# Patient Record
Sex: Female | Born: 1945
Health system: Southern US, Community
[De-identification: ages and names within clinical notes are randomized; demographics above are authoritative.]

## PROBLEM LIST (undated history)

## (undated) DIAGNOSIS — R112 Nausea with vomiting, unspecified: Secondary | ICD-10-CM

## (undated) DIAGNOSIS — I639 Cerebral infarction, unspecified: Secondary | ICD-10-CM

## (undated) DIAGNOSIS — R7301 Impaired fasting glucose: Secondary | ICD-10-CM

## (undated) DIAGNOSIS — E7800 Pure hypercholesterolemia, unspecified: Secondary | ICD-10-CM

## (undated) DIAGNOSIS — K219 Gastro-esophageal reflux disease without esophagitis: Secondary | ICD-10-CM

## (undated) DIAGNOSIS — M8580 Other specified disorders of bone density and structure, unspecified site: Secondary | ICD-10-CM

## (undated) DIAGNOSIS — F32A Depression, unspecified: Secondary | ICD-10-CM

## (undated) DIAGNOSIS — C801 Malignant (primary) neoplasm, unspecified: Secondary | ICD-10-CM

## (undated) DIAGNOSIS — Z9889 Other specified postprocedural states: Secondary | ICD-10-CM

## (undated) DIAGNOSIS — F329 Major depressive disorder, single episode, unspecified: Secondary | ICD-10-CM

## (undated) HISTORY — DX: Other specified disorders of bone density and structure, unspecified site: M85.80

## (undated) HISTORY — DX: Impaired fasting glucose: R73.01

## (undated) HISTORY — PX: OVARIAN CYST REMOVAL: SHX89

## (undated) HISTORY — PX: ECTOPIC PREGNANCY SURGERY: SHX613

## (undated) HISTORY — PX: ABDOMINAL HYSTERECTOMY: SHX81

---

## 2001-11-09 ENCOUNTER — Ambulatory Visit (HOSPITAL_COMMUNITY): Admission: RE | Admit: 2001-11-09 | Discharge: 2001-11-09 | Payer: Self-pay | Admitting: Family Medicine

## 2001-11-09 ENCOUNTER — Encounter: Payer: Self-pay | Admitting: Family Medicine

## 2001-12-01 ENCOUNTER — Encounter: Payer: Self-pay | Admitting: Family Medicine

## 2001-12-01 ENCOUNTER — Ambulatory Visit (HOSPITAL_COMMUNITY): Admission: RE | Admit: 2001-12-01 | Discharge: 2001-12-01 | Payer: Self-pay | Admitting: Family Medicine

## 2002-08-10 ENCOUNTER — Ambulatory Visit (HOSPITAL_COMMUNITY): Admission: RE | Admit: 2002-08-10 | Discharge: 2002-08-10 | Payer: Self-pay | Admitting: Family Medicine

## 2002-08-10 ENCOUNTER — Encounter: Payer: Self-pay | Admitting: Family Medicine

## 2002-12-24 ENCOUNTER — Encounter: Payer: Self-pay | Admitting: Family Medicine

## 2002-12-24 ENCOUNTER — Ambulatory Visit (HOSPITAL_COMMUNITY): Admission: RE | Admit: 2002-12-24 | Discharge: 2002-12-24 | Payer: Self-pay | Admitting: Family Medicine

## 2003-03-01 ENCOUNTER — Ambulatory Visit (HOSPITAL_COMMUNITY): Admission: RE | Admit: 2003-03-01 | Discharge: 2003-03-01 | Payer: Self-pay | Admitting: Family Medicine

## 2003-03-01 ENCOUNTER — Encounter: Payer: Self-pay | Admitting: Family Medicine

## 2005-03-26 ENCOUNTER — Ambulatory Visit (HOSPITAL_COMMUNITY): Admission: RE | Admit: 2005-03-26 | Discharge: 2005-03-26 | Payer: Self-pay | Admitting: Family Medicine

## 2005-12-09 HISTORY — PX: COLONOSCOPY: SHX174

## 2006-07-22 ENCOUNTER — Ambulatory Visit (HOSPITAL_COMMUNITY): Admission: RE | Admit: 2006-07-22 | Discharge: 2006-07-22 | Payer: Self-pay | Admitting: Internal Medicine

## 2006-07-22 ENCOUNTER — Ambulatory Visit: Payer: Self-pay | Admitting: Internal Medicine

## 2007-12-12 ENCOUNTER — Emergency Department (HOSPITAL_COMMUNITY): Admission: EM | Admit: 2007-12-12 | Discharge: 2007-12-12 | Payer: Self-pay | Admitting: Emergency Medicine

## 2009-06-26 ENCOUNTER — Ambulatory Visit (HOSPITAL_COMMUNITY): Admission: RE | Admit: 2009-06-26 | Discharge: 2009-06-26 | Payer: Self-pay | Admitting: Family Medicine

## 2009-07-11 ENCOUNTER — Ambulatory Visit (HOSPITAL_COMMUNITY): Admission: RE | Admit: 2009-07-11 | Discharge: 2009-07-11 | Payer: Self-pay | Admitting: Family Medicine

## 2010-12-29 ENCOUNTER — Encounter: Payer: Self-pay | Admitting: Family Medicine

## 2011-04-26 NOTE — Op Note (Signed)
NAME:  Patricia [name], Patricia [name]             ACCOUNT NO.:  [id number]   MEDICAL RECORD NO.:  [id number]          PATIENT TYPE:  AMB   LOCATION:  DAY                           FACILITY:  [facility]   PHYSICIAN:  R. Roetta Sessions, M.D. DATE OF BIRTH:  09/04/1946   DATE OF PROCEDURE:  07/22/2006  DATE OF DISCHARGE:                                 OPERATIVE REPORT   PROCEDURE PERFORMED:  Screening colonoscopy.   INDICATIONS FOR PROCEDURE:  The patient is a 65 year old Caucasian female  with no lower GI tract symptoms, sent over courtesy of Scott A. Gerda Diss, MD  for colorectal cancer screening.  She is devoid of any lower GI tract  symptoms as stated above.  No family history of colorectal neoplasia.  She  has never had her lower GI tract completely evaluated although she had  sigmoidoscopy years ago.  Colonoscopy is now being done as a standard  screening maneuver.  Ms. Sauerwein tells [name] she got nauseated with her prep  and did not take all of it last evening.  Potential risks, benefits and  alternatives have been reviewed.  Please see documentation in the medical  records.   PROCEDURE NOTE:  Oxygen saturations, blood pressure, pulse and respirations  were monitored throughout the entire procedure.  Conscious sedation Versed  2 mg IV, Demerol 50 mg IV in divided doses.   FINDINGS:  Digital exam revealed no abnormalities.   ENDOSCOPIC FINDINGS:  Indeed the prep was marginal to poor.  There was quite  a bit of viscous stool throughout the colon.   Rectum:  Examination of the rectal mucosa including retroflex view of the  anal verge revealed some internal hemorrhoids and a single anal papilla.  Colon:  Colonic mucosa was surveyed from the rectosigmoid junction to the  left, transverse and right colon to the area of the appendiceal orifice and  ileocecal valve and cecum.  These structures were well seen and photographed  for the record.  From this level, the scope was slowly withdrawn.  All  previously  mentioned mucosal surfaces were again seen.  The colonic mucosa  appeared normal.  However, there was quite a bit of granular viscous stool  for which copious washings and suctioning maneuvers were employed.  There  were no gross lesions in the colon.  A small polyp or other lesion may have  been easily obscured by the poor prep.  The patient tolerated the procedure  well, was reacted in endoscopy.   IMPRESSION:  Internal hemorrhoids, single anal papilla.  A grossly normal  rectum and colon, poor prep compromising exam.   RECOMMENDATIONS:  Would consider repeat colonoscopy somewhat early i.e. 5  years, given today's prep.      Jonathon Bellows, M.D.  Electronically Signed     RMR/MEDQ  D:  07/22/2006  T:  07/22/2006  Job:  [id number]   cc:   Lorin Picket A. Gerda Diss, MD  Fax: [phone]

## 2011-09-11 ENCOUNTER — Encounter (HOSPITAL_COMMUNITY)
Admission: RE | Admit: 2011-09-11 | Discharge: 2011-09-11 | Disposition: A | Discharge status: Home / Self Care | Payer: BC Managed Care – PPO | Source: Ambulatory Visit | Attending: Ophthalmology | Admitting: Ophthalmology

## 2011-09-11 ENCOUNTER — Other Ambulatory Visit: Payer: Self-pay

## 2011-09-11 ENCOUNTER — Encounter (HOSPITAL_COMMUNITY): Payer: Self-pay

## 2011-09-11 HISTORY — DX: Gastro-esophageal reflux disease without esophagitis: K21.9

## 2011-09-11 HISTORY — DX: Other specified postprocedural states: Z98.890

## 2011-09-11 HISTORY — DX: Depression, unspecified: F32.A

## 2011-09-11 HISTORY — DX: Nausea with vomiting, unspecified: R11.2

## 2011-09-11 HISTORY — DX: Pure hypercholesterolemia, unspecified: E78.00

## 2011-09-11 HISTORY — DX: Major depressive disorder, single episode, unspecified: F32.9

## 2011-09-11 LAB — BASIC METABOLIC PANEL
BUN: 18 mg/dL (ref 6–23)
GFR calc Af Amer: >90 mL/min (ref >90)
Potassium: 3.9 mEq/L (ref 3.5–5.1)

## 2011-09-11 LAB — CBC
Hemoglobin: 13.1 g/dL (ref 12.0–15.0)
MCHC: 32.9 g/dL (ref 30.0–36.0)
RDW: 12.4 % (ref 11.5–15.5)
WBC: 6.6 10*3/uL (ref 4.0–10.5)

## 2011-09-11 NOTE — Patient Instructions (Addendum)
20 Patricia [name]  09/11/2011   Your procedure is scheduled on:  09/12/2011  Report to [facility] at  1230  AM.  Call this number if you have problems the morning of surgery: [phone]   Remember:   Do not eat food:After Midnight.  Do not drink clear liquids: After Midnight.  Take these medicines the morning of surgery with A SIP OF WATER: none   Do not wear jewelry, make-up or nail polish.  Do not wear lotions, powders, or perfumes. You may wear deodorant.  Do not shave 48 hours prior to surgery.  Do not bring valuables to the hospital.  Contacts, dentures or bridgework may not be worn into surgery.  Leave suitcase in the car. After surgery it may be brought to your room.  For patients admitted to the hospital, checkout time is 11:00 AM the day of discharge.   Patients discharged the day of surgery will not be allowed to drive home.  Name and phone number of your driver: family  Special Instructions: N/A   Please read over the following fact sheets that you were given: Pain Booklet, Surgical Site Infection Prevention, Anesthesia Post-op Instructions and Care and Recovery After Surgery PATIENT INSTRUCTIONS POST-ANESTHESIA  IMMEDIATELY FOLLOWING SURGERY:  Do not drive or operate machinery for the first twenty four hours after surgery.  Do not make any important decisions for twenty four hours after surgery or while taking narcotic pain medications or sedatives.  If you develop intractable nausea and vomiting or a severe headache please notify your doctor immediately.  FOLLOW-UP:  Please make an appointment with your surgeon as instructed. You do not need to follow up with anesthesia unless specifically instructed to do so.  WOUND CARE INSTRUCTIONS (if applicable):  Keep a dry clean dressing on the anesthesia/puncture wound site if there is drainage.  Once the wound has quit draining you may leave it open to air.  Generally you should leave the bandage intact for twenty four hours  unless there is drainage.  If the epidural site drains for more than 36-48 hours please call the anesthesia department.  QUESTIONS?:  Please feel free to call your physician or the hospital operator if you have any questions, and they will be happy to assist you.     [facility] Anesthesia Department [address] [place] [phone]

## 2011-09-12 ENCOUNTER — Encounter (HOSPITAL_COMMUNITY): Payer: Self-pay | Admitting: Anesthesiology

## 2011-09-12 ENCOUNTER — Encounter (HOSPITAL_COMMUNITY): Payer: Self-pay | Admitting: *Deleted

## 2011-09-12 ENCOUNTER — Encounter (HOSPITAL_COMMUNITY)
Admission: RE | Disposition: A | Discharge status: Home / Self Care | Payer: Self-pay | Source: Ambulatory Visit | Attending: Ophthalmology

## 2011-09-12 ENCOUNTER — Ambulatory Visit (HOSPITAL_COMMUNITY): Payer: BC Managed Care – PPO | Admitting: Anesthesiology

## 2011-09-12 ENCOUNTER — Ambulatory Visit (HOSPITAL_COMMUNITY)
Admission: RE | Admit: 2011-09-12 | Discharge: 2011-09-12 | Disposition: A | Discharge status: Home / Self Care | Payer: BC Managed Care – PPO | Source: Ambulatory Visit | Attending: Ophthalmology | Admitting: Ophthalmology

## 2011-09-12 DIAGNOSIS — Z01810 Encounter for preprocedural cardiovascular examination: Secondary | ICD-10-CM | POA: Insufficient documentation

## 2011-09-12 DIAGNOSIS — H2589 Other age-related cataract: Secondary | ICD-10-CM | POA: Insufficient documentation

## 2011-09-12 DIAGNOSIS — Z01812 Encounter for preprocedural laboratory examination: Secondary | ICD-10-CM | POA: Insufficient documentation

## 2011-09-12 HISTORY — PX: CATARACT EXTRACTION W/PHACO: SHX586

## 2011-09-12 SURGERY — PHACOEMULSIFICATION, CATARACT, WITH IOL INSERTION
Anesthesia: Monitor Anesthesia Care | Site: Eye | Laterality: Left | Wound class: Clean

## 2011-09-12 MED ORDER — TETRACAINE HCL 0.5 % OP SOLN
OPHTHALMIC | Status: AC
  Administered 2011-09-12: 1 [drp] via OPHTHALMIC
  Filled 2011-09-12: qty 2

## 2011-09-12 MED ORDER — ONDANSETRON HCL 4 MG/2ML IJ SOLN
INTRAMUSCULAR | Status: AC
  Administered 2011-09-12: 4 mg via INTRAVENOUS
  Filled 2011-09-12: qty 2

## 2011-09-12 MED ORDER — PHENYLEPHRINE HCL 2.5 % OP SOLN
1.0000 [drp] | OPHTHALMIC | Status: AC
  Administered 2011-09-12 (×3): 1 [drp] via OPHTHALMIC

## 2011-09-12 MED ORDER — LIDOCAINE HCL (PF) 1 % IJ SOLN
INTRAMUSCULAR | Status: DC | PRN
  Administered 2011-09-12: .4 mL

## 2011-09-12 MED ORDER — LIDOCAINE HCL 3.5 % OP GEL
OPHTHALMIC | Status: AC
  Filled 2011-09-12: qty 5

## 2011-09-12 MED ORDER — EPINEPHRINE HCL 1 MG/ML IJ SOLN
INTRAOCULAR | Status: DC | PRN
  Administered 2011-09-12: 14:00:00

## 2011-09-12 MED ORDER — CYCLOPENTOLATE-PHENYLEPHRINE 0.2-1 % OP SOLN
OPHTHALMIC | Status: AC
  Administered 2011-09-12: 1 [drp] via OPHTHALMIC
  Filled 2011-09-12: qty 2

## 2011-09-12 MED ORDER — ONDANSETRON HCL 4 MG/2ML IJ SOLN
4.0000 mg | Freq: Once | INTRAMUSCULAR | Status: AC
  Administered 2011-09-12: 4 mg via INTRAVENOUS

## 2011-09-12 MED ORDER — LACTATED RINGERS IV SOLN
INTRAVENOUS | Status: DC
  Administered 2011-09-12: 1000 mL via INTRAVENOUS

## 2011-09-12 MED ORDER — EPINEPHRINE HCL 1 MG/ML IJ SOLN
INTRAMUSCULAR | Status: AC
  Filled 2011-09-12: qty 1

## 2011-09-12 MED ORDER — MIDAZOLAM HCL 2 MG/2ML IJ SOLN
INTRAMUSCULAR | Status: AC
  Administered 2011-09-12: 2 mg via INTRAVENOUS
  Filled 2011-09-12: qty 2

## 2011-09-12 MED ORDER — PROVISC 10 MG/ML IO SOLN
INTRAOCULAR | Status: DC | PRN
  Administered 2011-09-12: .85 mL via INTRAOCULAR

## 2011-09-12 MED ORDER — TETRACAINE HCL 0.5 % OP SOLN
1.0000 [drp] | OPHTHALMIC | Status: AC
  Administered 2011-09-12 (×3): 1 [drp] via OPHTHALMIC

## 2011-09-12 MED ORDER — PHENYLEPHRINE HCL 2.5 % OP SOLN
OPHTHALMIC | Status: AC
  Administered 2011-09-12: 1 [drp] via OPHTHALMIC
  Filled 2011-09-12: qty 2

## 2011-09-12 MED ORDER — MIDAZOLAM HCL 2 MG/2ML IJ SOLN
1.0000 mg | INTRAMUSCULAR | Status: DC | PRN
  Administered 2011-09-12: 2 mg via INTRAVENOUS

## 2011-09-12 MED ORDER — BSS IO SOLN
INTRAOCULAR | Status: DC | PRN
  Administered 2011-09-12: 15 mL via INTRAOCULAR

## 2011-09-12 MED ORDER — POVIDONE-IODINE 5 % OP SOLN
OPHTHALMIC | Status: DC | PRN
  Administered 2011-09-12: 1 via OPHTHALMIC

## 2011-09-12 MED ORDER — LIDOCAINE HCL 3.5 % OP GEL
1.0000 "application " | Freq: Once | OPHTHALMIC | Status: AC
  Administered 2011-09-12: 1 via OPHTHALMIC

## 2011-09-12 MED ORDER — NEOMYCIN-POLYMYXIN-DEXAMETH 3.5-10000-0.1 OP OINT
TOPICAL_OINTMENT | OPHTHALMIC | Status: AC
  Filled 2011-09-12: qty 3.5

## 2011-09-12 MED ORDER — LIDOCAINE HCL (PF) 1 % IJ SOLN
INTRAMUSCULAR | Status: AC
  Filled 2011-09-12: qty 2

## 2011-09-12 MED ORDER — CYCLOPENTOLATE-PHENYLEPHRINE 0.2-1 % OP SOLN
1.0000 [drp] | OPHTHALMIC | Status: AC
  Administered 2011-09-12 (×3): 1 [drp] via OPHTHALMIC

## 2011-09-12 MED ORDER — NEOMYCIN-POLYMYXIN-DEXAMETH 0.1 % OP OINT
TOPICAL_OINTMENT | OPHTHALMIC | Status: DC | PRN
  Administered 2011-09-12: 1 via OPHTHALMIC

## 2011-09-12 MED ORDER — LIDOCAINE 3.5 % OP GEL OPTIME - NO CHARGE
OPHTHALMIC | Status: DC | PRN
  Administered 2011-09-12: 1 [drp] via OPHTHALMIC

## 2011-09-12 SURGICAL SUPPLY — 33 items
CAPSULAR TENSION RING-AMO (OPHTHALMIC RELATED) IMPLANT
CLOTH BEACON ORANGE TIMEOUT ST (SAFETY) ×2 IMPLANT
DUOVISC SYSTEM (INTRAOCULAR LENS)
EYE SHIELD UNIVERSAL CLEAR (GAUZE/BANDAGES/DRESSINGS) ×2 IMPLANT
GLOVE BIO SURGEON STRL SZ 6.5 (GLOVE) IMPLANT
GLOVE BIOGEL PI IND STRL 6.5 (GLOVE) ×1 IMPLANT
GLOVE BIOGEL PI IND STRL 7.0 (GLOVE) IMPLANT
GLOVE BIOGEL PI IND STRL 7.5 (GLOVE) IMPLANT
GLOVE BIOGEL PI INDICATOR 6.5 (GLOVE) ×1
GLOVE BIOGEL PI INDICATOR 7.0 (GLOVE)
GLOVE BIOGEL PI INDICATOR 7.5 (GLOVE)
GLOVE ECLIPSE 6.5 STRL STRAW (GLOVE) IMPLANT
GLOVE ECLIPSE 7.0 STRL STRAW (GLOVE) IMPLANT
GLOVE ECLIPSE 7.5 STRL STRAW (GLOVE) IMPLANT
GLOVE EXAM NITRILE LRG STRL (GLOVE) IMPLANT
GLOVE EXAM NITRILE MD LF STRL (GLOVE) ×2 IMPLANT
GLOVE SKINSENSE NS SZ6.5 (GLOVE)
GLOVE SKINSENSE NS SZ7.0 (GLOVE)
GLOVE SKINSENSE STRL SZ6.5 (GLOVE) IMPLANT
GLOVE SKINSENSE STRL SZ7.0 (GLOVE) IMPLANT
KIT VITRECTOMY (OPHTHALMIC RELATED) IMPLANT
PAD ARMBOARD 7.5X6 YLW CONV (MISCELLANEOUS) ×2 IMPLANT
PROC W NO LENS (INTRAOCULAR LENS)
PROC W SPEC LENS (INTRAOCULAR LENS)
PROCESS W NO LENS (INTRAOCULAR LENS) IMPLANT
PROCESS W SPEC LENS (INTRAOCULAR LENS) IMPLANT
RING MALYGIN (MISCELLANEOUS) IMPLANT
SIGHTPATH CAT PROC W REG LENS (Ophthalmic Related) ×2 IMPLANT
SYR TB 1ML LL NO SAFETY (SYRINGE) ×2 IMPLANT
SYSTEM DUOVISC (INTRAOCULAR LENS) IMPLANT
TAPE CLOTH 1X10 TAN NS (GAUZE/BANDAGES/DRESSINGS) ×2 IMPLANT
VISCOELASTIC ADDITIONAL (OPHTHALMIC RELATED) IMPLANT
WATER STERILE IRR 250ML POUR (IV SOLUTION) ×2 IMPLANT

## 2011-09-12 NOTE — Op Note (Signed)
NAME:  Patricia [name], Patricia [name]             ACCOUNT NO.:  [id number]  MEDICAL RECORD NO.:  [id number]  LOCATION:  [facility]                          FACILITY:  [facility]  PHYSICIAN:  Susanne Greenhouse, MD       DATE OF BIRTH:  [birth year]  DATE OF PROCEDURE:  09/12/2011 DATE OF DISCHARGE:  09/12/2011                              OPERATIVE REPORT   PREOPERATIVE DIAGNOSIS:  Combined cataract, left eye.  POSTOPERATIVE DIAGNOSIS:  Combined cataract, left eye.  DIAGNOSIS CODE:  366.19.  No surgical specimens.  PROSTHETIC DEVICE USED:  Lenstec posterior chamber lens, model Softec HD, power of 24.5, serial number is [id number].          ______________________________ Susanne Greenhouse, MD     KEH/MEDQ  D:  09/12/2011  T:  09/12/2011  Job:  [id number]

## 2011-09-12 NOTE — Transfer of Care (Signed)
Immediate Anesthesia Transfer of Care Note  Patient: Patricia [name]  Procedure(s) Performed:  CATARACT EXTRACTION PHACO AND INTRAOCULAR LENS PLACEMENT (IOC) - CDE: 8.91  Patient Location: PACU  Anesthesia Type: MAC  Level of Consciousness: awake, alert  and oriented  Airway & Oxygen Therapy: Patient Spontanous Breathing  Post-op Assessment: Report given to PACU RN  Post vital signs: Reviewed and stable  Complications: No apparent anesthesia complications

## 2011-09-12 NOTE — H&P (Signed)
I have reviewed the H&P, the patient was re-examined, and I have identified no interval changes in medical condition and plan of care since the history and physical of record  

## 2011-09-12 NOTE — Brief Op Note (Signed)
Pre-Op Dx: Cataract OS Post-Op Dx: Cataract OS Surgeon: Karlyn Glasco Anesthesia: Topical with MAC Implant: Lenstec, Model Softec HD Specimen: None Complications: None 

## 2011-09-12 NOTE — Anesthesia Postprocedure Evaluation (Signed)
  Anesthesia Post-op Note  Patient: Patricia [name]  Procedure(s) Performed:  CATARACT EXTRACTION PHACO AND INTRAOCULAR LENS PLACEMENT (IOC) - CDE: 8.91  Patient Location: PACU  Anesthesia Type: MAC  Level of Consciousness: awake, alert  and oriented  Airway and Oxygen Therapy: Patient Spontanous Breathing  Post-op Pain: none  Post-op Assessment: Post-op Vital signs reviewed, Patient's Cardiovascular Status Stable, Respiratory Function Stable and No signs of Nausea or vomiting  Post-op Vital Signs: Reviewed and stable  Complications: No apparent anesthesia complications

## 2011-09-12 NOTE — Anesthesia Preprocedure Evaluation (Addendum)
Anesthesia Evaluation  Name, MR# and DOB Patient awake  General Assessment Comment  Reviewed: Allergy & Precautions, H&P , NPO status , Patient's Chart, lab work & pertinent test results  History of Anesthesia Complications (+) PONV  Airway Mallampati: II    Mouth opening: Limited Mouth Opening  Dental  (+) Edentulous Upper and Edentulous Lower   Pulmonary former smoker   Pulmonary exam normal       Cardiovascular Regular Normal    Neuro/Psych PSYCHIATRIC DISORDERS Depression    GI/Hepatic GERD Medicated and Controlled  Endo/Other    Renal/GU      Musculoskeletal   Abdominal   Peds  Hematology   Anesthesia Other Findings   Reproductive/Obstetrics                           Anesthesia Physical Anesthesia Plan  ASA: II  Anesthesia Plan: MAC   Post-op Pain Management:    Induction:   Airway Management Planned: Nasal Cannula  Additional Equipment:   Intra-op Plan:   Post-operative Plan:   Informed Consent: I have reviewed the patients History and Physical, chart, labs and discussed the procedure including the risks, benefits and alternatives for the proposed anesthesia with the patient or authorized representative who has indicated his/her understanding and acceptance.     Plan Discussed with:   Anesthesia Plan Comments:        Anesthesia Quick Evaluation

## 2011-09-19 ENCOUNTER — Encounter (HOSPITAL_COMMUNITY): Payer: Self-pay | Admitting: Ophthalmology

## 2011-11-14 ENCOUNTER — Encounter (HOSPITAL_COMMUNITY): Payer: Self-pay

## 2011-11-14 ENCOUNTER — Encounter (HOSPITAL_COMMUNITY): Payer: Self-pay | Admitting: Pharmacy Technician

## 2011-11-14 ENCOUNTER — Encounter (HOSPITAL_COMMUNITY)
Admission: RE | Admit: 2011-11-14 | Discharge: 2011-11-14 | Disposition: A | Discharge status: Home / Self Care | Payer: BC Managed Care – PPO | Source: Ambulatory Visit | Attending: Ophthalmology | Admitting: Ophthalmology

## 2011-11-14 LAB — BASIC METABOLIC PANEL
BUN: 26 mg/dL — ABNORMAL HIGH (ref 6–23)
CO2: 26 mEq/L (ref 19–32)
Chloride: 105 mEq/L (ref 96–112)
Creatinine, Ser: 0.74 mg/dL (ref 0.50–1.10)
Glucose, Bld: 110 mg/dL — ABNORMAL HIGH (ref 70–99)

## 2011-11-14 LAB — CBC
HCT: 40 % (ref 36.0–46.0)
MCH: 29.8 pg (ref 26.0–34.0)
MCV: 89.7 fL (ref 78.0–100.0)
RBC: 4.46 MIL/uL (ref 3.87–5.11)
WBC: 6.4 10*3/uL (ref 4.0–10.5)

## 2011-11-14 NOTE — Patient Instructions (Addendum)
20 Patricia [name]  11/14/2011   Your procedure is scheduled on:  11/18/11   Report to [facility] at 11:30 AM.  Call this number if you have problems the morning of surgery: [phone]   Remember:   Do not eat food:After Midnight.  May have clear liquids:until Midnight .  Clear liquids include soda, tea, black coffee, apple or grape juice, broth.  Take these medicines the morning of surgery with A SIP OF WATER: Omeprazole and Paxil.   Do not wear jewelry, make-up or nail polish.  Do not wear lotions, powders, or perfumes. You may wear deodorant.  Do not shave 48 hours prior to surgery.  Do not bring valuables to the hospital.  Contacts, dentures or bridgework may not be worn into surgery.  Leave suitcase in the car. After surgery it may be brought to your room.  For patients admitted to the hospital, checkout time is 11:00 AM the day of discharge.   Patients discharged the day of surgery will not be allowed to drive home.  Name and phone number of your driver:   Special Instructions: N/A   Please read over the following fact sheets that you were given: Anesthesia Post-op Instructions     Cataract A cataract is a clouding of the lens of the eye. It is most often related to aging. A cataract is not a "film" over the surface of the eye. The lens is inside the eye and changes size of the pupil. The lens can enlarge to let more light enter the eye in dark environments and contract the size of the pupil to let in bright light. The lens is the part of the eye that helps focus light on the retina. The retina is the eye's light-sensitive layer. It is in the back of the eye that sends visual signals to the brain. In a normal eye, light passes through the lens and gets focused on the retina. To help produce a sharp image, the lens must remain clear. When a lens becomes cloudy, vision is compromised by the degree and nature of the clouding. Certain cataracts make people more near-sighted as they  develop, others increase glare, and all reduce vision to some degree or another. A cataract that is so dense that it becomes milky Patricia [name] and a Patricia [name] opacity can be seen through the pupil. When the Patricia [name] color is seen, it is called a "mature" or "hyper-mature cataract." Such cataracts cause total blindness in the affected eye. The cataract must be removed to prevent damage to the eye itself. Some types of cataracts can cause a secondary disease of the eye, such as certain types of glaucoma. In the early stages, better lighting and eyeglasses may lessen vision problems caused by cataracts. At a certain point, surgery may be needed to improve vision. CAUSES   Aging. However, cataracts may occur at any age, even in newborns.   Certain drugs.   Trauma to the eye.   Certain diseases (such as diabetes).   Inherited or acquired medical syndromes.  SYMPTOMS   Gradual, progressive drop in vision in the affected eye. Cataracts may develop at different rates in each eye. Cataracts may even be in just one eye with the other unaffected.   Cataracts due to trauma may develop quickly, sometimes over a matter or days or even hours. The result is severe and rapid visual loss.  DIAGNOSIS  To detect a cataract, an eye doctor examines the lens. A well developed cataract can be diagnosed  without dilating the pupil. Early cataracts and others of a specific nature are best diagnosed with an exam of the eyes with the pupils dilated by drops. TREATMENT   For an early cataract, vision may improve by using different eyeglasses or stronger lighting.   If the above measures do not help, surgery is the only effective treatment. This treatment removes the cloudy lens and replaces it with a substitute lens (Intraocular lens, or IOL). Newly developed IOL technology allows the implanted lens to improve vision both at a distance and up close. Discuss with your eye surgeon about the possibility of still needing glasses. Also  discuss how visual coordination between both eyes will be affected.  A cataract needs to be removed only when vision loss interferes with your everyday activities such as driving, reading or watching TV. You and your eye doctor can make that decision together. In most cases, waiting until you are ready to have cataract surgery will not harm your eye. If you have cataracts in both eyes, only one should be removed at a time. This allows the operated eye to heal and be out of danger from serious problems (such as infection or poor wound healing) before having the other eye undergo surgery.  Sometimes, a cataract should be removed even if it does not cause problems with your vision. For example, a cataract should be removed if it prevents examination or treatment of another eye problem. Just as you cannot see out of the affected eye well, your doctor cannot see into your eye well through a cataract. The vast majority of people who have cataract surgery have better vision afterward. CATARACT REMOVAL There are two primary ways to remove a cataract. Your doctor can explain the differences and help determine which is best for you:  Phacoemulsification (small incision cataract surgery). This involves making a small cut (incision) on the edge of the clear, dome-shaped surface that covers the front of the eye (the cornea). An injection behind the eye or eye drops are given to make this a painless procedure. The doctor then inserts a tiny probe into the eye. This device emits ultrasound waves that soften and break up the cloudy center of the lens so it can be removed by suction. Most cataract surgery is done this way. The cuts are usually so small and performed in such a manner that often no sutures are needed to keep it closed.   Extracapsular surgery. Your doctor makes a slightly longer incision on the side of the cornea. The doctor removes the hard center of the lens. The remainder of the lens is then removed by  suction. In some cases, extremely fine sutures are needed which the doctor may, or may not remove in the office after the surgery.  When an IOL is implanted, it needs no care. It becomes a permanent part of your eye and cannot be seen or felt.  Some people cannot have an IOL. They may have problems during surgery, or maybe they have another eye disease. For these people, a soft contact lens may be suggested. If an IOL or contact lens cannot be used, very powerful and thick glasses are required after surgery. Since vision is very different through such thick glasses, it is important to have your doctor discuss the impact on  your vision after any cataract surgery where there is no plan to implant an IOL. The normal lens of the eye is covered by a clear capsule. Both phacoemulsification and extracapsular surgery require that  the back surface of this lens capsule be left in place. This helps support IOLs and prevents the IOL from dislocating and falling back into the deeper interior of the eye. Right after surgery, and often permanently this "posterior capsule" remains clear. In some cases however, it can become cloudy, presenting the same type of visual compromise that the original cataract did since light is again obstructed as it passes through the clear IOL. This condition is often referred to as an "after-cataract." Fortunately, after-cataracts are easily treated using a painless and very fast laser treatment that is performed without anesthesia or incisions. It is done in a matter of minutes in an outpatient environment. Visual improvement is often immediate.  HOME CARE INSTRUCTIONS   Your surgeon will discuss pre and post operative care with you prior to surgery. The majority of people are able to do almost all normal activities right away. Although, it is often advised to avoid strenuous activity for a period of time.   Postoperative drops and careful avoidance of infection will be needed. Many  surgeons suggest the use of a protective shield during the first few days after surgery.   There is a very small incidence of complication from modern cataract surgery, but it can happen. Infection that spreads to the inside of the eye (endophthalmitis) can result in total visual loss and even loss of the eye itself. In extremely rare instances, the inflammation of endophthalmitis can spread to both eyes (sympathetic ophthalmia). Appropriate post-operative care under the close observation of your surgeon is essential to a successful outcome.  SEEK IMMEDIATE MEDICAL CARE IF:   You have any sudden drop of vision in the operated eye.   You have pain in the operated eye.   You see a large number of floating dots in the field of vision in the operated eye.   You see flashing lights, or if a portion of your side vision in any direction appears black (like a curtain being drawn into your field of vision) in the operated eye.  Document Released: 11/25/2005 Document Revised: 08/07/2011 Document Reviewed: 01/11/2008 [facility] Patient Information 2012 [place], [place].    PATIENT INSTRUCTIONS POST-ANESTHESIA  IMMEDIATELY FOLLOWING SURGERY:  Do not drive or operate machinery for the first twenty four hours after surgery.  Do not make any important decisions for twenty four hours after surgery or while taking narcotic pain medications or sedatives.  If you develop intractable nausea and vomiting or a severe headache please notify your doctor immediately.  FOLLOW-UP:  Please make an appointment with your surgeon as instructed. You do not need to follow up with anesthesia unless specifically instructed to do so.  WOUND CARE INSTRUCTIONS (if applicable):  Keep a dry clean dressing on the anesthesia/puncture wound site if there is drainage.  Once the wound has quit draining you may leave it open to air.  Generally you should leave the bandage intact for twenty four hours unless there is drainage.  If the  epidural site drains for more than 36-48 hours please call the anesthesia department.  QUESTIONS?:  Please feel free to call your physician or the hospital operator if you have any questions, and they will be happy to assist you.     [facility] Anesthesia Department [address] [place] [phone]

## 2011-11-18 ENCOUNTER — Encounter (HOSPITAL_COMMUNITY): Payer: Self-pay | Admitting: Anesthesiology

## 2011-11-18 ENCOUNTER — Encounter (HOSPITAL_COMMUNITY): Payer: Self-pay

## 2011-11-18 ENCOUNTER — Ambulatory Visit (HOSPITAL_COMMUNITY)
Admission: RE | Admit: 2011-11-18 | Discharge: 2011-11-18 | Disposition: A | Discharge status: Home / Self Care | Payer: BC Managed Care – PPO | Source: Ambulatory Visit | Attending: Ophthalmology | Admitting: Ophthalmology

## 2011-11-18 ENCOUNTER — Encounter (HOSPITAL_COMMUNITY)
Admission: RE | Disposition: A | Discharge status: Home / Self Care | Payer: Self-pay | Source: Ambulatory Visit | Attending: Ophthalmology

## 2011-11-18 DIAGNOSIS — H2589 Other age-related cataract: Secondary | ICD-10-CM | POA: Insufficient documentation

## 2011-11-18 DIAGNOSIS — Z01812 Encounter for preprocedural laboratory examination: Secondary | ICD-10-CM | POA: Insufficient documentation

## 2011-11-18 HISTORY — PX: CATARACT EXTRACTION W/PHACO: SHX586

## 2011-11-18 SURGERY — PHACOEMULSIFICATION, CATARACT, WITH IOL INSERTION
Anesthesia: Monitor Anesthesia Care | Site: Eye | Laterality: Right | Wound class: Clean

## 2011-11-18 MED ORDER — EPINEPHRINE HCL 1 MG/ML IJ SOLN
INTRAOCULAR | Status: DC | PRN
  Administered 2011-11-18: 13:00:00

## 2011-11-18 MED ORDER — PHENYLEPHRINE HCL 2.5 % OP SOLN
1.0000 [drp] | OPHTHALMIC | Status: AC
  Administered 2011-11-18 (×3): 1 [drp] via OPHTHALMIC

## 2011-11-18 MED ORDER — TETRACAINE HCL 0.5 % OP SOLN
1.0000 [drp] | OPHTHALMIC | Status: AC
  Administered 2011-11-18 (×3): 1 [drp] via OPHTHALMIC

## 2011-11-18 MED ORDER — NEOMYCIN-POLYMYXIN-DEXAMETH 0.1 % OP OINT
TOPICAL_OINTMENT | OPHTHALMIC | Status: DC | PRN
  Administered 2011-11-18: 1 via OPHTHALMIC

## 2011-11-18 MED ORDER — MIDAZOLAM HCL 2 MG/2ML IJ SOLN
1.0000 mg | INTRAMUSCULAR | Status: DC | PRN
  Administered 2011-11-18: 2 mg via INTRAVENOUS

## 2011-11-18 MED ORDER — CYCLOPENTOLATE-PHENYLEPHRINE 0.2-1 % OP SOLN
1.0000 [drp] | OPHTHALMIC | Status: AC
  Administered 2011-11-18 (×3): 1 [drp] via OPHTHALMIC

## 2011-11-18 MED ORDER — TETRACAINE HCL 0.5 % OP SOLN
OPHTHALMIC | Status: AC
  Administered 2011-11-18: 1 [drp] via OPHTHALMIC
  Filled 2011-11-18: qty 2

## 2011-11-18 MED ORDER — CYCLOPENTOLATE-PHENYLEPHRINE 0.2-1 % OP SOLN
OPHTHALMIC | Status: AC
  Administered 2011-11-18: 1 [drp] via OPHTHALMIC
  Filled 2011-11-18: qty 2

## 2011-11-18 MED ORDER — LIDOCAINE 3.5 % OP GEL OPTIME - NO CHARGE
OPHTHALMIC | Status: DC | PRN
  Administered 2011-11-18: 1 [drp] via OPHTHALMIC

## 2011-11-18 MED ORDER — MIDAZOLAM HCL 2 MG/2ML IJ SOLN
INTRAMUSCULAR | Status: AC
  Administered 2011-11-18: 2 mg via INTRAVENOUS
  Filled 2011-11-18: qty 2

## 2011-11-18 MED ORDER — NEOMYCIN-POLYMYXIN-DEXAMETH 3.5-10000-0.1 OP OINT
TOPICAL_OINTMENT | OPHTHALMIC | Status: AC
  Filled 2011-11-18: qty 3.5

## 2011-11-18 MED ORDER — LIDOCAINE HCL 3.5 % OP GEL
1.0000 "application " | Freq: Once | OPHTHALMIC | Status: AC
  Administered 2011-11-18: 1 via OPHTHALMIC

## 2011-11-18 MED ORDER — EPINEPHRINE HCL 1 MG/ML IJ SOLN
INTRAMUSCULAR | Status: AC
  Filled 2011-11-18: qty 1

## 2011-11-18 MED ORDER — POVIDONE-IODINE 5 % OP SOLN
OPHTHALMIC | Status: DC | PRN
  Administered 2011-11-18: 1 via OPHTHALMIC

## 2011-11-18 MED ORDER — LACTATED RINGERS IV SOLN
INTRAVENOUS | Status: DC
  Administered 2011-11-18: 12:00:00 via INTRAVENOUS

## 2011-11-18 MED ORDER — LIDOCAINE HCL (PF) 1 % IJ SOLN
INTRAOCULAR | Status: DC | PRN
  Administered 2011-11-18: 13:00:00 via OPHTHALMIC

## 2011-11-18 MED ORDER — BSS IO SOLN
INTRAOCULAR | Status: DC | PRN
  Administered 2011-11-18: 15 mL via INTRAOCULAR

## 2011-11-18 MED ORDER — LIDOCAINE HCL 3.5 % OP GEL
OPHTHALMIC | Status: AC
  Administered 2011-11-18: 1 via OPHTHALMIC
  Filled 2011-11-18: qty 5

## 2011-11-18 MED ORDER — PROVISC 10 MG/ML IO SOLN
INTRAOCULAR | Status: DC | PRN
  Administered 2011-11-18: 8.5 mg via INTRAOCULAR

## 2011-11-18 MED ORDER — LIDOCAINE HCL (PF) 1 % IJ SOLN
INTRAMUSCULAR | Status: AC
  Filled 2011-11-18: qty 2

## 2011-11-18 MED ORDER — PHENYLEPHRINE HCL 2.5 % OP SOLN
OPHTHALMIC | Status: AC
  Administered 2011-11-18: 1 [drp] via OPHTHALMIC
  Filled 2011-11-18: qty 2

## 2011-11-18 SURGICAL SUPPLY — 32 items

## 2011-11-18 NOTE — Transfer of Care (Signed)
Immediate Anesthesia Transfer of Care Note  Patient: Patricia [name]  Procedure(s) Performed:  CATARACT EXTRACTION PHACO AND INTRAOCULAR LENS PLACEMENT (IOC) - CDE:10.26  Patient Location: PACU and Short Stay  Anesthesia Type: MAC  Level of Consciousness: awake  Airway & Oxygen Therapy: Patient Spontanous Breathing  Post-op Assessment: Report given to PACU RN  Post vital signs: Reviewed and stable  Complications: No apparent anesthesia complications

## 2011-11-18 NOTE — Brief Op Note (Signed)
Pre-Op Dx: Cataract OD Post-Op Dx: Cataract OD Surgeon: Kahleel Fadeley Anesthesia: Topical with MAC Implant: B&L enVista Blood Loss: None Specimen: None Complications: None 

## 2011-11-18 NOTE — Anesthesia Preprocedure Evaluation (Signed)
Anesthesia Evaluation  Patient identified by MRN, date of birth, ID band Patient awake    Reviewed: Allergy & Precautions, H&P , NPO status , Patient's Chart, lab work & pertinent test results  History of Anesthesia Complications (+) PONV  Airway Mallampati: II      Dental  (+) Edentulous Upper and Edentulous Lower   Pulmonary    Pulmonary exam normal       Cardiovascular neg cardio ROS Regular Normal    Neuro/Psych PSYCHIATRIC DISORDERS Depression    GI/Hepatic GERD-  Medicated,  Endo/Other    Renal/GU      Musculoskeletal   Abdominal   Peds  Hematology   Anesthesia Other Findings   Reproductive/Obstetrics                           Anesthesia Physical Anesthesia Plan  ASA: II  Anesthesia Plan: MAC   Post-op Pain Management:    Induction: Intravenous  Airway Management Planned: Nasal Cannula  Additional Equipment:   Intra-op Plan:   Post-operative Plan:   Informed Consent: I have reviewed the patients History and Physical, chart, labs and discussed the procedure including the risks, benefits and alternatives for the proposed anesthesia with the patient or authorized representative who has indicated his/her understanding and acceptance.     Plan Discussed with:   Anesthesia Plan Comments:         Anesthesia Quick Evaluation

## 2011-11-18 NOTE — H&P (Signed)
I have reviewed the H&P, the patient was re-examined, and I have identified no interval changes in medical condition and plan of care since the history and physical of record  

## 2011-11-18 NOTE — Anesthesia Postprocedure Evaluation (Signed)
  Anesthesia Post-op Note  Patient: Patricia [name]  Procedure(s) Performed:  CATARACT EXTRACTION PHACO AND INTRAOCULAR LENS PLACEMENT (IOC) - CDE:10.26  Patient Location: PACU and Short Stay  Anesthesia Type: MAC  Level of Consciousness: awake, alert  and oriented  Airway and Oxygen Therapy: Patient Spontanous Breathing  Post-op Pain: none  Post-op Assessment: Post-op Vital signs reviewed, Patient's Cardiovascular Status Stable, Respiratory Function Stable and No signs of Nausea or vomiting  Post-op Vital Signs: Reviewed and stable  Complications: No apparent anesthesia complications

## 2011-11-19 NOTE — Op Note (Signed)
NAME:  Patricia [name], Patricia [name]             ACCOUNT NO.:  [id number]  MEDICAL RECORD NO.:  [id number]  LOCATION:  [facility]                          FACILITY:  [facility]  PHYSICIAN:  Susanne Greenhouse, MD       DATE OF BIRTH:  October 22, 1946  DATE OF PROCEDURE:  11/18/2011 DATE OF DISCHARGE:  11/18/2011                              OPERATIVE REPORT   PREOPERATIVE DIAGNOSIS:  Combined cataract, right eye, diagnosis code 366.19.  POSTOPERATIVE DIAGNOSIS:  Combined cataract, right eye, diagnosis code 366.19.  OPERATION PERFORMED:  Phacoemulsification with posterior chamber intraocular lens implantation, right eye.  SURGEON:  Bonne Dolores. Kairav Russomanno, MD.  ANESTHESIA:  General endotracheal anesthesia.  OPERATIVE SUMMARY:  In the preoperative area, dilating drops were placed into the right eye.  The patient was then brought into the operating room where she was placed under general anesthesia.  The eye was then prepped and draped.  Beginning with a 75 blade, a paracentesis port was made at the surgeon's 2 o'clock position.  The anterior chamber was then filled with a 1% nonpreserved lidocaine solution with epinephrine.  This was followed by Viscoat to deepen the chamber.  A small fornix-based peritomy was performed superiorly.  Next, a single iris hook was placed through the limbus superiorly.  A 2.4-mm keratome blade was then used to make a clear corneal incision over the iris hook.  A bent cystotome needle and Utrata forceps were used to create a continuous tear capsulotomy.  Hydrodissection was performed using balanced salt solution on a fine cannula.  The lens nucleus was then removed using phacoemulsification in a quadrant cracking technique.  The cortical material was then removed with irrigation and aspiration.  The capsular bag and anterior chamber were refilled with Provisc.  The wound was widened to approximately 3 mm and a posterior chamber intraocular lens was placed into the capsular bag without difficulty  using an [employer] lens injecting system.  A single 10-0 nylon suture was then used to close the incision as well as stromal hydration.  The Provisc was removed from the anterior chamber and capsular bag with irrigation and aspiration.  At this point, the wounds were tested for leak, which were negative.  The anterior chamber remained deep and stable.  The patient tolerated the procedure well.  There were no operative complications, and she awoke from general anesthesia without problem.  No surgical specimens.  Prosthetic device used is a Cabin crew posterior chamber lens, model MX60, power of 25.5, serial number is [id number].          ______________________________ Susanne Greenhouse, MD     KEH/MEDQ  D:  11/18/2011  T:  11/19/2011  Job:  [id number]

## 2011-11-25 ENCOUNTER — Encounter (HOSPITAL_COMMUNITY): Payer: Self-pay | Admitting: Ophthalmology

## 2012-05-07 ENCOUNTER — Other Ambulatory Visit: Payer: Self-pay | Admitting: Family Medicine

## 2012-05-07 DIAGNOSIS — M8580 Other specified disorders of bone density and structure, unspecified site: Secondary | ICD-10-CM

## 2012-05-12 ENCOUNTER — Ambulatory Visit (HOSPITAL_COMMUNITY)
Admission: RE | Admit: 2012-05-12 | Discharge: 2012-05-12 | Disposition: A | Discharge status: Home / Self Care | Payer: BC Managed Care – PPO | Source: Ambulatory Visit | Attending: Family Medicine | Admitting: Family Medicine

## 2012-05-12 DIAGNOSIS — Z780 Asymptomatic menopausal state: Secondary | ICD-10-CM | POA: Insufficient documentation

## 2012-05-12 DIAGNOSIS — M8580 Other specified disorders of bone density and structure, unspecified site: Secondary | ICD-10-CM

## 2012-11-17 ENCOUNTER — Encounter (HOSPITAL_COMMUNITY): Payer: Self-pay | Admitting: *Deleted

## 2012-11-17 ENCOUNTER — Emergency Department (HOSPITAL_COMMUNITY): Payer: PRIVATE HEALTH INSURANCE

## 2012-11-17 ENCOUNTER — Emergency Department (HOSPITAL_COMMUNITY)
Admission: EM | Admit: 2012-11-17 | Discharge: 2012-11-17 | Disposition: A | Discharge status: Home / Self Care | Payer: PRIVATE HEALTH INSURANCE | Attending: Emergency Medicine | Admitting: Emergency Medicine

## 2012-11-17 DIAGNOSIS — M25519 Pain in unspecified shoulder: Secondary | ICD-10-CM

## 2012-11-17 DIAGNOSIS — K219 Gastro-esophageal reflux disease without esophagitis: Secondary | ICD-10-CM | POA: Insufficient documentation

## 2012-11-17 DIAGNOSIS — Z79899 Other long term (current) drug therapy: Secondary | ICD-10-CM | POA: Insufficient documentation

## 2012-11-17 DIAGNOSIS — X500XXA Overexertion from strenuous movement or load, initial encounter: Secondary | ICD-10-CM | POA: Insufficient documentation

## 2012-11-17 DIAGNOSIS — S4980XA Other specified injuries of shoulder and upper arm, unspecified arm, initial encounter: Secondary | ICD-10-CM | POA: Insufficient documentation

## 2012-11-17 DIAGNOSIS — F329 Major depressive disorder, single episode, unspecified: Secondary | ICD-10-CM | POA: Insufficient documentation

## 2012-11-17 DIAGNOSIS — S46909A Unspecified injury of unspecified muscle, fascia and tendon at shoulder and upper arm level, unspecified arm, initial encounter: Secondary | ICD-10-CM | POA: Insufficient documentation

## 2012-11-17 DIAGNOSIS — Y929 Unspecified place or not applicable: Secondary | ICD-10-CM | POA: Insufficient documentation

## 2012-11-17 DIAGNOSIS — E7800 Pure hypercholesterolemia, unspecified: Secondary | ICD-10-CM | POA: Insufficient documentation

## 2012-11-17 DIAGNOSIS — Y9389 Activity, other specified: Secondary | ICD-10-CM | POA: Insufficient documentation

## 2012-11-17 DIAGNOSIS — F3289 Other specified depressive episodes: Secondary | ICD-10-CM | POA: Insufficient documentation

## 2012-11-17 DIAGNOSIS — Z87891 Personal history of nicotine dependence: Secondary | ICD-10-CM | POA: Insufficient documentation

## 2012-11-17 IMAGING — CR DG SHOULDER 2+V*R*
3 series · 3 of 3 positions shown · non-contrast
Comparison: None

CLINICAL DATA: Shoulder pain

RIGHT SHOULDER - 2+ VIEW

[view not recorded (1 of 3)]
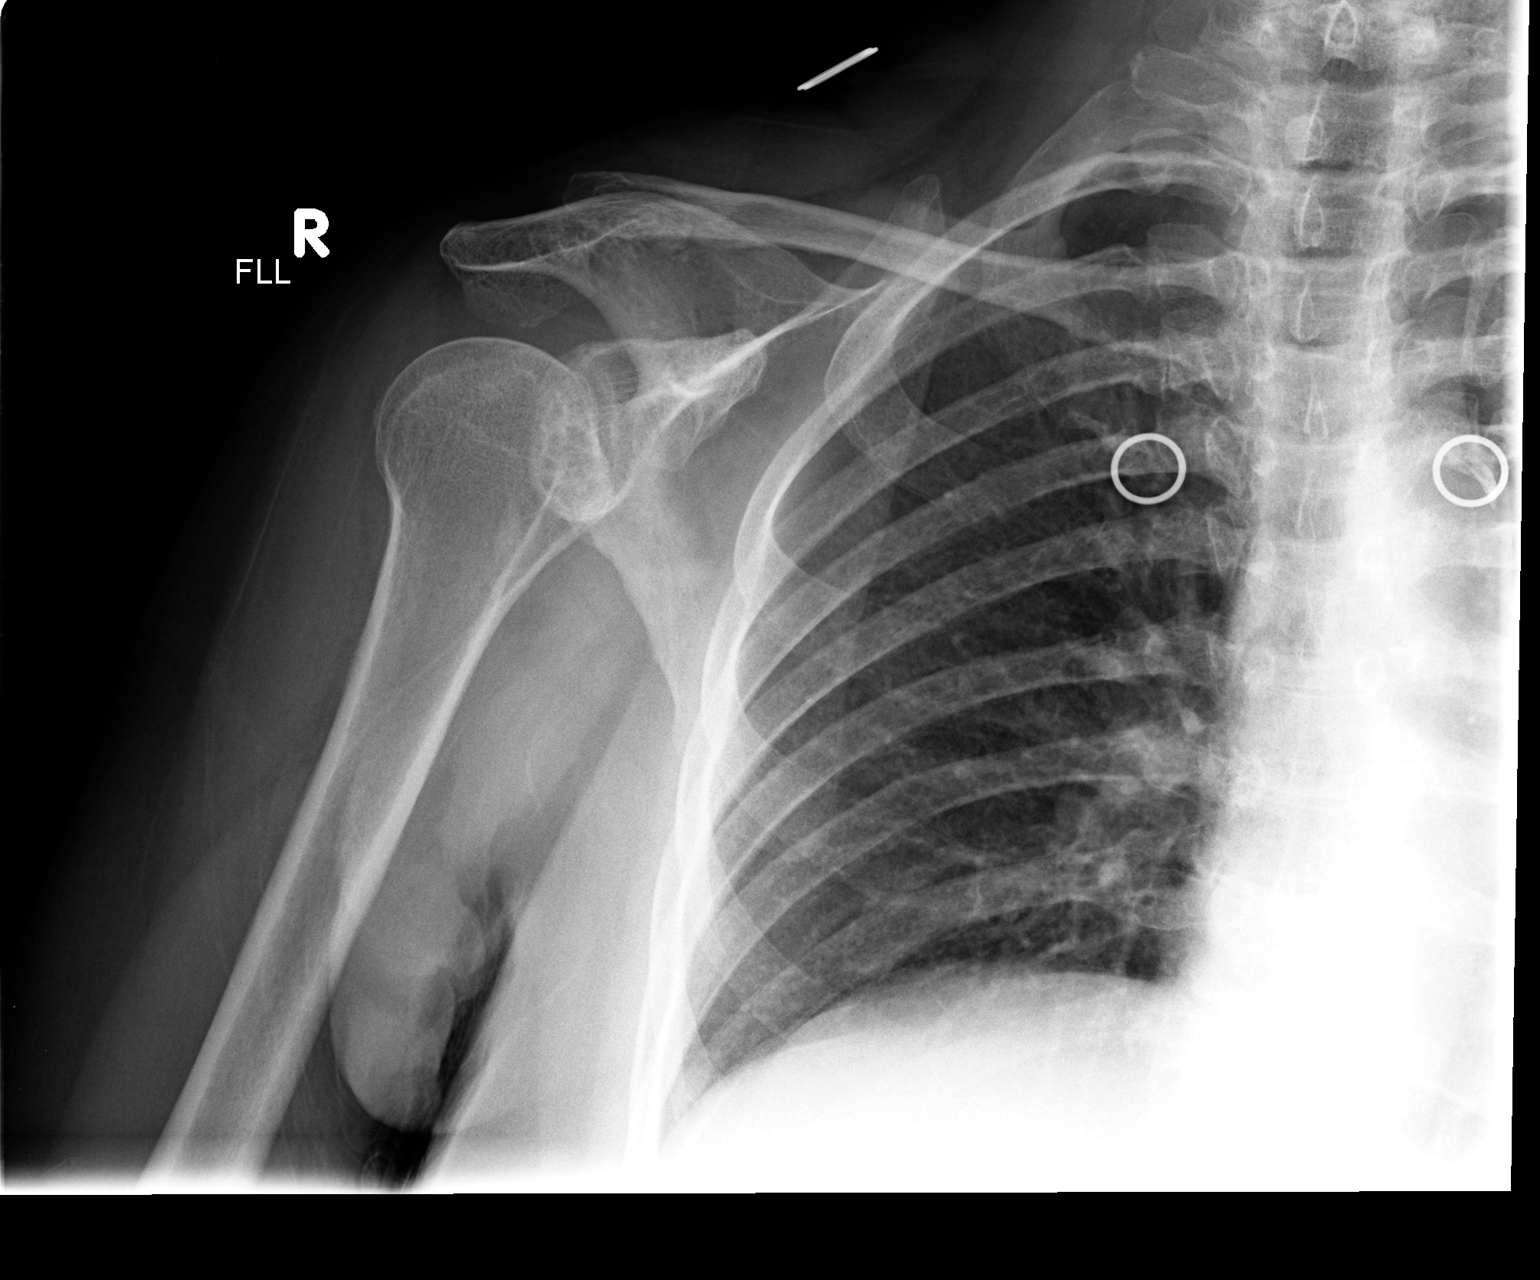

[view not recorded (2 of 3)]
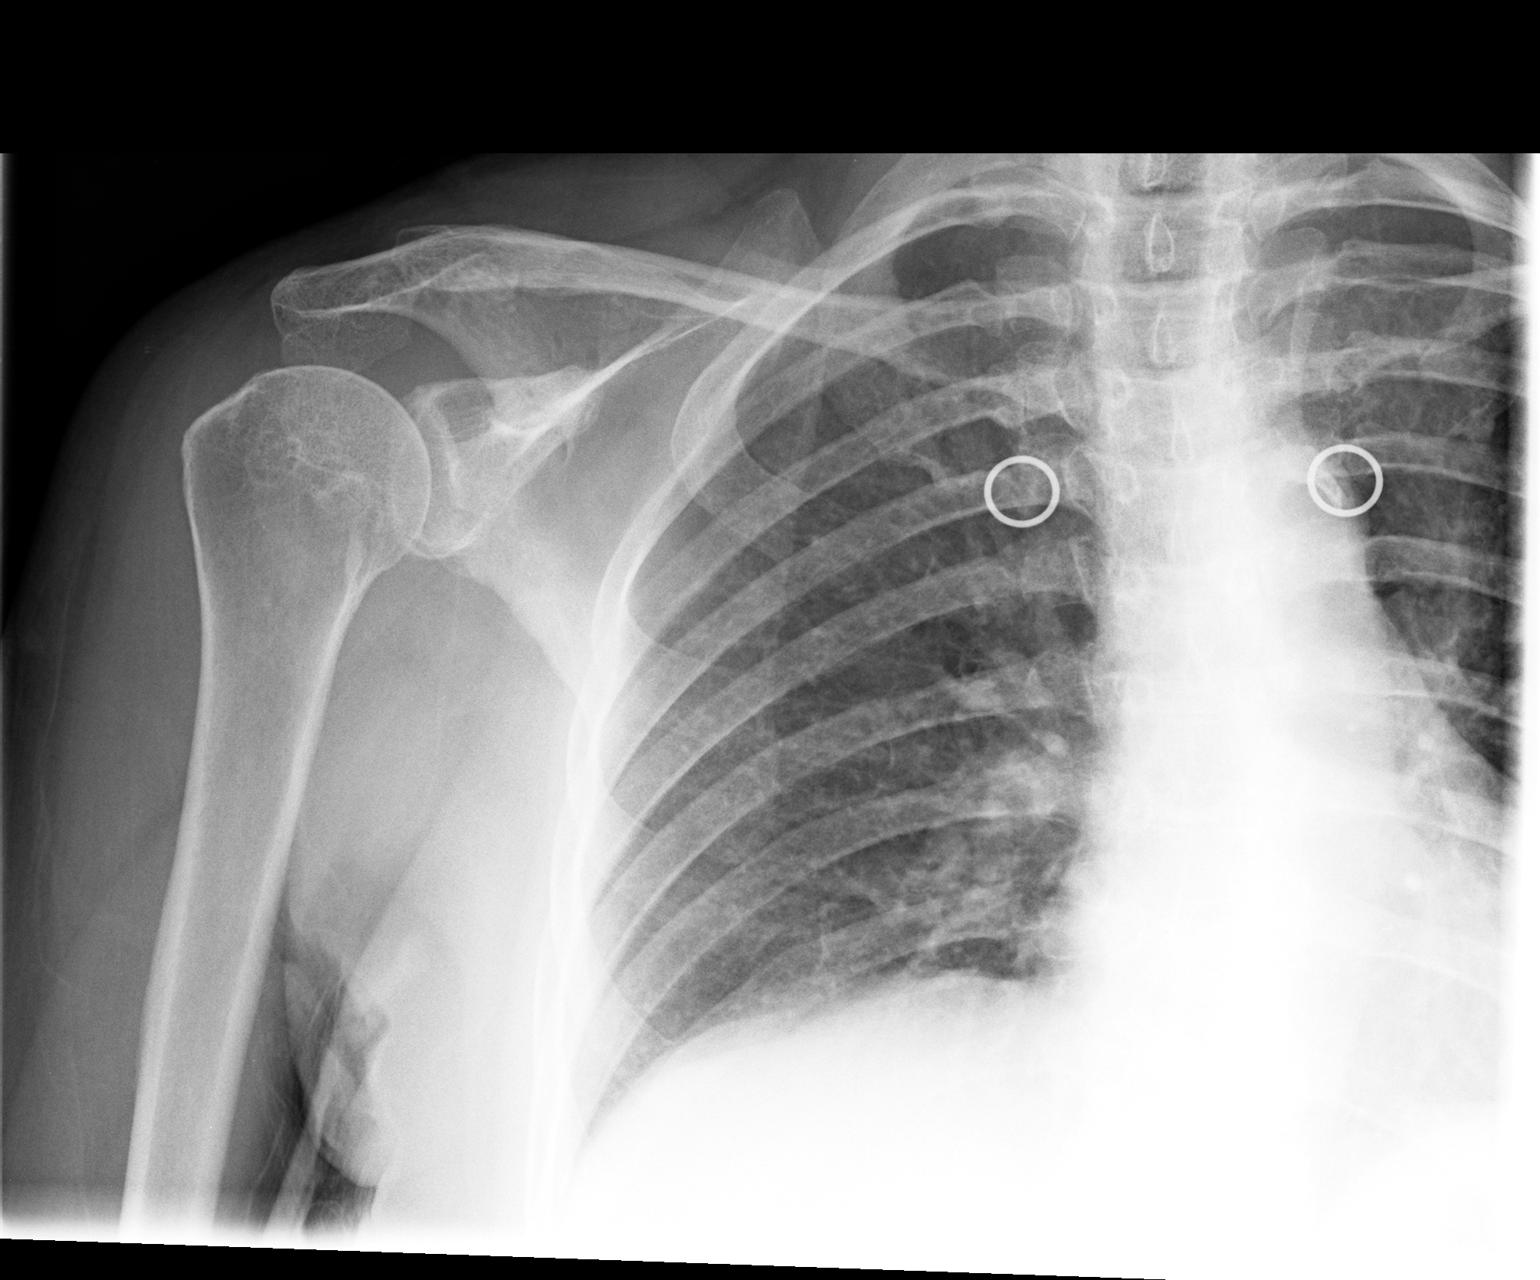

[view not recorded (3 of 3)]
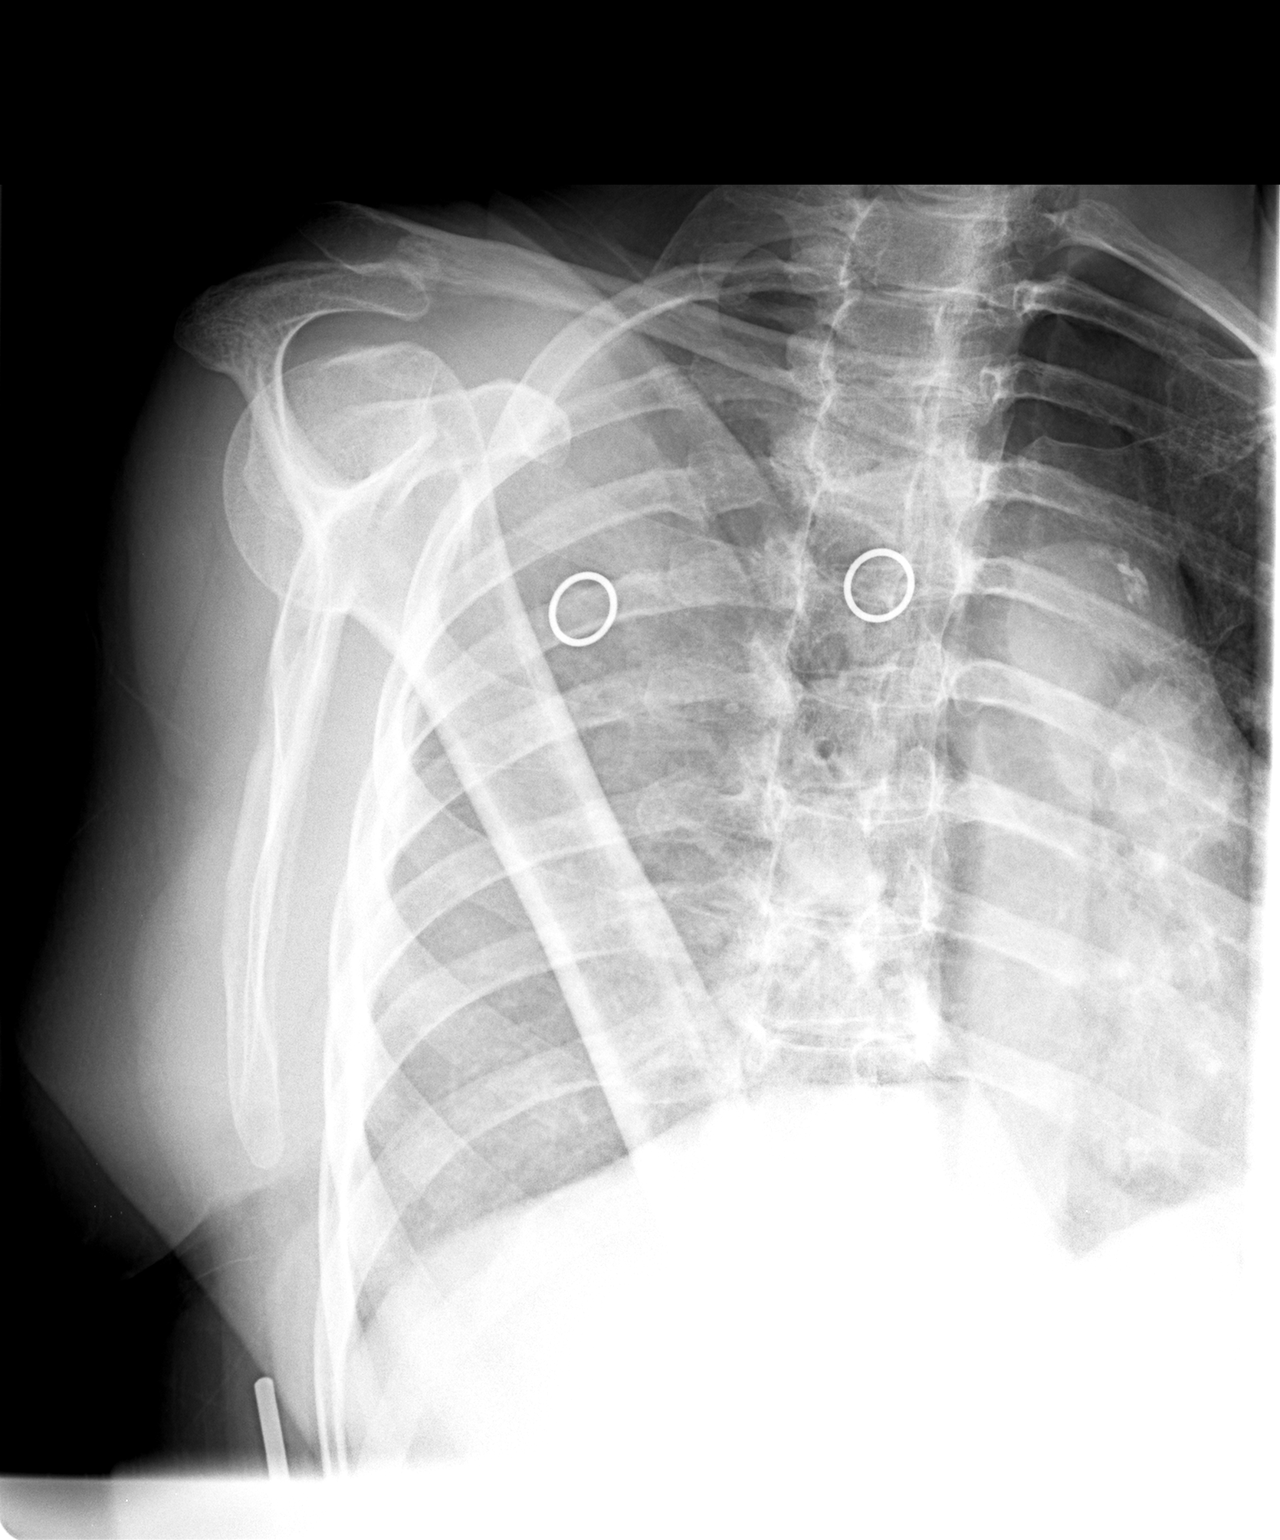

[3 of 3 positions shown; findings below may reference images not displayed]

FINDINGS: Osseous demineralization.
AC joint alignment suboptimally visualized.
Question inferior spur formation at acromion.
No fracture or dislocation identified.
Visualized right ribs appear intact.
IMPRESSION: Osseous demineralization.
No definite acute abnormalities.
Question inferior acromial spur formation.

## 2012-11-17 MED ORDER — NAPROXEN 500 MG PO TABS: 500.0000 mg | ORAL_TABLET | Freq: Two times a day (BID) | ORAL | Status: DC

## 2012-11-17 MED ORDER — HYDROCODONE-ACETAMINOPHEN 5-325 MG PO TABS: ORAL_TABLET | ORAL | Status: DC

## 2012-11-17 NOTE — ED Notes (Signed)
C/o right shoulder pain after lifting pt last night.

## 2012-11-17 NOTE — ED Provider Notes (Signed)
History     CSN: [id number]  Arrival date & time 11/17/12  1606   First MD Initiated Contact with Patient 11/17/12 1737      Chief Complaint  Patient presents with  . Shoulder Pain    (Consider location/radiation/quality/duration/timing/severity/associated sxs/prior treatment) HPI Comments: Patient c/o pain to her right shoulder that began on the evening prior to ED arrival.  States that she was helping someone and felt a sudden "pop" in her shoulder and now has pain with movement or rotation of the joint..  Pain improves with the arm held closely to her body.  She denies neck pain, dizziness, headache, chest pain, numbness or weakness of the arm.  She also denies radiation of pain  Patient is a 66 y.o. female presenting with shoulder injury. The history is provided by the patient.  Shoulder Injury This is a new problem. The current episode started in the past 7 days. The problem occurs constantly. The problem has been unchanged. Associated symptoms include arthralgias. Pertinent negatives include no chest pain, chills, congestion, coughing, diaphoresis, fever, headaches, joint swelling, neck pain, numbness, rash, vomiting or weakness. Exacerbated by: movement and palpation. She has tried nothing for the symptoms. The treatment provided no relief.    Past Medical History  Diagnosis Date  . PONV (postoperative nausea and vomiting)   . Hypercholesterolemia   . GERD (gastroesophageal reflux disease)   . Depression     Past Surgical History  Procedure Date  . Ectopic pregnancy surgery     removal of right tube  . Ovarian cyst removal     right  . Cataract extraction w/phaco 09/12/2011    Procedure: CATARACT EXTRACTION PHACO AND INTRAOCULAR LENS PLACEMENT (IOC);  Surgeon: Gemma Payor;  Location: [facility];  Service: Ophthalmology;  Laterality: Left;  CDE: 8.91  . Cataract extraction w/phaco 11/18/2011    Procedure: CATARACT EXTRACTION PHACO AND INTRAOCULAR LENS PLACEMENT (IOC);   Surgeon: Gemma Payor;  Location: [facility];  Service: Ophthalmology;  Laterality: Right;  CDE:10.26    Family History  Problem Relation Age of Onset  . Anesthesia problems Neg Hx   . Hypotension Neg Hx   . Malignant hyperthermia Neg Hx   . Pseudochol deficiency Neg Hx     History  Substance Use Topics  . Smoking status: Former Smoker -- 1.0 packs/day for 20 years    Types: Cigarettes    Quit date: 09/10/1984  . Smokeless tobacco: Not on file  . Alcohol Use: No    OB History    Grav Para Term Preterm Abortions TAB SAB Ect Mult Living                  Review of Systems  Constitutional: Negative for fever, chills and diaphoresis.  HENT: Negative for congestion and neck pain.   Respiratory: Negative for cough.   Cardiovascular: Negative for chest pain.  Gastrointestinal: Negative for vomiting.  Genitourinary: Negative for dysuria and difficulty urinating.  Musculoskeletal: Positive for arthralgias. Negative for joint swelling.  Skin: Negative for color change, rash and wound.  Neurological: Negative for dizziness, facial asymmetry, speech difficulty, weakness, numbness and headaches.  All other systems reviewed and are negative.    Allergies  Codeine  Home Medications   Current Outpatient Rx  Name  Route  Sig  Dispense  Refill  . ALENDRONATE SODIUM 70 MG PO TABS   Oral   Take 70 mg by mouth every 7 (seven) days. Take with a full glass of water on an empty  stomach.          Marland Kitchen CALCIUM CARBONATE-VITAMIN D 500-200 MG-UNIT PO TABS   Oral   Take 1 tablet by mouth at bedtime.           Carma Leaven M PLUS PO TABS   Oral   Take 1 tablet by mouth daily.           Marland Kitchen NAPROXEN SODIUM 220 MG PO TABS   Oral   Take 220 mg by mouth daily as needed. headaches          . OMEPRAZOLE 20 MG PO CPDR   Oral   Take 20 mg by mouth 2 (two) times daily.           Marland Kitchen PAROXETINE HCL 30 MG PO TABS   Oral   Take 30 mg by mouth at bedtime.          Marland Kitchen PRAVASTATIN SODIUM 40 MG PO  TABS   Oral   Take 40 mg by mouth at bedtime.            BP 149/81  Pulse 62  Temp 98.2 F (36.8 C) (Oral)  Resp 18  Ht 4\' 11"  (1.499 m)  Wt 124 lb (56.246 kg)  BMI 25.04 kg/m2  SpO2 97%  Physical Exam  Nursing note and vitals reviewed. Constitutional: She is oriented to person, place, and time. She appears well-developed and well-nourished. No distress.  HENT:  Head: Normocephalic and atraumatic.  Neck: Normal range of motion. Neck supple. No thyromegaly present.  Cardiovascular: Normal rate, regular rhythm, normal heart sounds and intact distal pulses.   No murmur heard. Pulmonary/Chest: Effort normal and breath sounds normal. No respiratory distress. She exhibits no tenderness.  Musculoskeletal: She exhibits tenderness. She exhibits no edema.       Right shoulder: She exhibits tenderness and pain. She exhibits normal range of motion, no bony tenderness, no swelling, no effusion, no crepitus, no deformity, no laceration, no spasm, normal pulse and normal strength.       Arms:      ttp of the right shoulder.  Pain with abduction of the right arm and rotation of the shoulder.  Radial pulse is brisk, sensation intact, CR< 2 sec.  No abrasions, edema or deformity of the joint. Pt has full ROM of the neck  Lymphadenopathy:    She has no cervical adenopathy.  Neurological: She is alert and oriented to person, place, and time. No cranial nerve deficit or sensory deficit. She exhibits normal muscle tone. Coordination normal.  Reflex Scores:      Tricep reflexes are 2+ on the right side and 2+ on the left side.      Bicep reflexes are 2+ on the right side and 2+ on the left side. Skin: Skin is warm and dry.    ED Course  Procedures (including critical care time)  Labs Reviewed - No data to display Dg Shoulder Right  11/17/2012  *RADIOLOGY REPORT*  Clinical Data: Shoulder pain  RIGHT SHOULDER - 2+ VIEW  Comparison: None  Findings: Osseous demineralization. AC joint alignment  suboptimally visualized. Question inferior spur formation at acromion. No fracture or dislocation identified. Visualized right ribs appear intact.  IMPRESSION: Osseous demineralization. No definite acute abnormalities. Question inferior acromial spur formation.   Original Report Authenticated By: Ulyses Southward, M.D.      Sling applied, pain improved. Remains NV intact   MDM     ttp of the anterior right shoulder.  Pain reproduced with  rotation and abduction of the arm.  Distal sensation intact, radial pulse brisk, CR< 3 sec.    Pt agrees to ice, pain medication and close f/u with Dr. Romeo Apple   Prescribed: norco #20 naprosyn  Jill Ruppe L. [place], [place] 11/19/12 1646

## 2012-11-19 NOTE — ED Provider Notes (Signed)
Medical screening examination/treatment/procedure(s) were performed by non-physician practitioner and as supervising physician I was immediately available for consultation/collaboration.   Estanislao Harmon, MD 11/19/12 1819 

## 2013-03-23 ENCOUNTER — Other Ambulatory Visit: Payer: Self-pay | Admitting: *Deleted

## 2013-03-23 MED ORDER — PAROXETINE HCL 30 MG PO TABS: 30.0000 mg | ORAL_TABLET | Freq: Every day | ORAL | Status: DC

## 2013-05-25 ENCOUNTER — Other Ambulatory Visit: Payer: Self-pay | Admitting: Family Medicine

## 2013-05-26 ENCOUNTER — Encounter: Payer: Self-pay | Admitting: Family Medicine

## 2013-06-05 ENCOUNTER — Other Ambulatory Visit: Payer: Self-pay | Admitting: Family Medicine

## 2013-06-07 ENCOUNTER — Encounter: Payer: Self-pay | Admitting: *Deleted

## 2013-08-25 ENCOUNTER — Ambulatory Visit (INDEPENDENT_AMBULATORY_CARE_PROVIDER_SITE_OTHER): Payer: BC Managed Care – PPO | Admitting: Nurse Practitioner

## 2013-08-25 ENCOUNTER — Encounter: Payer: Self-pay | Admitting: Nurse Practitioner

## 2013-08-25 VITALS — BP 134/88 | Ht 59.0 in | Wt 131.0 lb

## 2013-08-25 DIAGNOSIS — Z124 Encounter for screening for malignant neoplasm of cervix: Secondary | ICD-10-CM

## 2013-08-25 DIAGNOSIS — Z23 Encounter for immunization: Secondary | ICD-10-CM

## 2013-08-25 DIAGNOSIS — R7301 Impaired fasting glucose: Secondary | ICD-10-CM

## 2013-08-25 DIAGNOSIS — Z0000 Encounter for general adult medical examination without abnormal findings: Secondary | ICD-10-CM

## 2013-08-25 DIAGNOSIS — Z01419 Encounter for gynecological examination (general) (routine) without abnormal findings: Secondary | ICD-10-CM

## 2013-08-25 NOTE — Progress Notes (Signed)
  Subjective:    Patient ID: Patricia [name], female    DOB: [birth year], 67 y.o.   MRN: [id number]  HPI Presents for wellness checkup. Gets regular eye exams.  Hearing testing at work normal.  Has labs today that were done at work. Has had Zostavax. Healthy diet. Very active, working 2 jobs. Wears dentures. No lesion or sores in mouth. Doing well on Paxil. Same sexual partner.    Review of Systems  Constitutional: Negative for fever, activity change, appetite change and fatigue.  HENT: Negative for hearing loss, sore throat, rhinorrhea, mouth sores, dental problem and postnasal drip.   Eyes: Negative for visual disturbance.  Respiratory: Negative for cough, chest tightness, shortness of breath and wheezing.   Cardiovascular: Negative for chest pain and leg swelling.  Gastrointestinal: Negative for nausea, vomiting, abdominal pain, diarrhea, constipation, blood in stool and abdominal distention.  Genitourinary: Negative for dysuria, urgency, frequency, vaginal bleeding, vaginal discharge, difficulty urinating, menstrual problem and pelvic pain.  Neurological: Negative for headaches.  Psychiatric/Behavioral: Negative for sleep disturbance and dysphoric mood. The patient is not nervous/anxious.        Objective:   Physical Exam  Vitals reviewed. Constitutional: She is oriented to person, place, and time. She appears well-developed. No distress.  HENT:  Right Ear: External ear normal.  Left Ear: External ear normal.  Mouth/Throat: Oropharynx is clear and moist.  Neck: Normal range of motion. Neck supple. No tracheal deviation present. No thyromegaly present.  Cardiovascular: Normal rate, regular rhythm and normal heart sounds.  Exam reveals no gallop.   No murmur heard. Pulmonary/Chest: Effort normal and breath sounds normal.  Abdominal: Soft. She exhibits no distension. There is no tenderness.  Genitourinary: Vagina normal and uterus normal. No vaginal discharge found.   Musculoskeletal: She exhibits no edema.  Lymphadenopathy:    She has no cervical adenopathy.  Neurological: She is alert and oriented to person, place, and time.  Skin: Skin is warm and dry. No rash noted.  Psychiatric: She has a normal mood and affect. Her behavior is normal.  Breast exam: no masses; axillae no adenopathy. External GU: normal; signs of hypoestrogenism. Vagina: pale, slight moist; no discharge. Cervix: normal in appearance; no CMT. Bimanual exam: Normal; no masses; ovaries nonpalpable Rectal exam: normal; no stool for hemoccult     Assessment & Plan:  Well woman exam  Screening for cervical cancer - Plan: Pap IG w/ reflex to HPV when ASC-U  Routine general medical examination at a health care facility - Plan: Pap IG w/ reflex to HPV when ASC-U, POC Hemoccult Bld/Stl (3-Cd Home Screen)  Impaired fasting glucose - Plan: Pneumococcal polysaccharide vaccine 23-valent greater than or equal to 2yo subcutaneous/IM  Need for prophylactic vaccination and inoculation against influenza  Recommend daily vitamin D and calcium supplement. Next physical in one year.

## 2013-08-26 LAB — PAP IG W/ RFLX HPV ASCU

## 2013-08-27 ENCOUNTER — Encounter: Payer: Self-pay | Admitting: Nurse Practitioner

## 2013-08-27 ENCOUNTER — Other Ambulatory Visit: Payer: Self-pay | Admitting: Family Medicine

## 2013-08-27 DIAGNOSIS — M8580 Other specified disorders of bone density and structure, unspecified site: Secondary | ICD-10-CM | POA: Insufficient documentation

## 2013-08-27 DIAGNOSIS — E782 Mixed hyperlipidemia: Secondary | ICD-10-CM | POA: Insufficient documentation

## 2013-08-27 DIAGNOSIS — E785 Hyperlipidemia, unspecified: Secondary | ICD-10-CM | POA: Insufficient documentation

## 2013-08-27 DIAGNOSIS — R7301 Impaired fasting glucose: Secondary | ICD-10-CM | POA: Insufficient documentation

## 2013-08-27 NOTE — Assessment & Plan Note (Signed)
Stable.  Total chol 205;HDL 86; TG 53 and LDL 108

## 2013-08-27 NOTE — Assessment & Plan Note (Signed)
FBS on 07/02/2013 was 91

## 2013-09-08 ENCOUNTER — Encounter: Payer: Self-pay | Admitting: Family Medicine

## 2013-10-12 ENCOUNTER — Other Ambulatory Visit: Payer: Self-pay

## 2013-10-12 MED ORDER — PRAVASTATIN SODIUM 40 MG PO TABS: 40.0000 mg | ORAL_TABLET | Freq: Every day | ORAL | Status: DC

## 2013-11-25 ENCOUNTER — Telehealth: Payer: Self-pay | Admitting: Nurse Practitioner

## 2013-11-25 NOTE — Telephone Encounter (Signed)
error 

## 2013-11-25 NOTE — Telephone Encounter (Deleted)
error 

## 2013-12-08 ENCOUNTER — Ambulatory Visit (INDEPENDENT_AMBULATORY_CARE_PROVIDER_SITE_OTHER): Payer: BC Managed Care – PPO | Admitting: Obstetrics and Gynecology

## 2013-12-08 ENCOUNTER — Encounter: Payer: Self-pay | Admitting: Obstetrics and Gynecology

## 2013-12-08 ENCOUNTER — Encounter (INDEPENDENT_AMBULATORY_CARE_PROVIDER_SITE_OTHER): Payer: Self-pay

## 2013-12-08 VITALS — BP 130/80 | Ht 59.0 in | Wt 134.0 lb

## 2013-12-08 DIAGNOSIS — N816 Rectocele: Secondary | ICD-10-CM

## 2013-12-08 DIAGNOSIS — N815 Vaginal enterocele: Secondary | ICD-10-CM

## 2013-12-08 NOTE — Patient Instructions (Signed)
Rectocele/Enterocele Care After A woman's birth canal (vagina) can become weak or stretched. This can be caused by childbirth, heavy lifting, lasting (chronic) constipation, aging, or pelvic surgery. When the vagina is weak and stretched, parts of the intestine can bulge into the vagina by pushing against the vaginal walls. A rectocele is when the very end of the large intestine (rectum) causes the bulge. An enterocele is when the small intestine causes the bulge. Surgery to fix this problem is usually done through the vagina. If you just had this surgery, you were probably given a drug to make you sleep (general anesthetic) or a drug that numbs you from the waist down (spinal/epidural). Here is what happened:  The small intestine or rectum was pushed back to its normal place.  The vaginal wall was made stronger. Sometimes this is done with stitches or a mesh-like material. HOME CARE INSTRUCTIONS  Some women go home the same day as their surgery. Others stay in the hospital for a few days. This depends on the size and type of repair.  Pain and MedicationsRectocele/Enterocele Care After A woman's birth canal (vagina) can become weak or stretched. This can be caused by childbirth, heavy lifting, lasting (chronic) constipation, aging, or pelvic surgery. When the vagina is weak and stretched, parts of the intestine can bulge into the vagina by pushing against the vaginal walls. A rectocele is when the very end of the large intestine (rectum) causes the bulge. An enterocele is when the small intestine causes the bulge. Surgery to fix this problem is usually done through the vagina. If you just had this surgery, you were probably given a drug to make you sleep (general anesthetic) or a drug that numbs you from the waist down (spinal/epidural). Here is what happened:  The small intestine or rectum was pushed back to its normal place.  The vaginal wall was made stronger. Sometimes this is done with stitches  or a mesh-like material. HOME CARE INSTRUCTIONS  Some women go home the same day as their surgery. Others stay in the hospital for a few days. This depends on the size and type of repair.  Pain and Medications  Some pain is normal after this surgery. Only take pain medicine your surgeon prescribed. Follow the directions carefully.  Do not take aspirin. It can cause bleeding.  Do not drink alcohol while taking pain medication.  You may be given a medicine (antibiotic) that kills germs. Follow the directions carefully.  Take warm sitz baths 2 times a day to control discomfort and reduce any swelling. Take sitz baths with your caregiver's permission. Diet  Go back to your normal eating as directed by your caregiver.  Drink a lot of fluids. Drink at least 6 glasses of water every day. Activity  Move around and walk as much as possible. This can keep blood clots from forming in your legs.  Do not climb stairs until your caregiver says it is okay.  Do not lift objects 5 pounds (2.3 kg) or heavier. Do not bend or strain for 6 to 8 weeks.  Do not drive until after you stop taking pain medicine and your caregiver says it is okay.  Your return to work will depend on the type of work you do. Ask your caregiver what is best for you.  Ask your caregiver when you can resume sexual activity. Most women can start having sex in about 6 weeks after their surgery.  Get plenty of rest during the day and sleep at  night.  Have someone help you with your household chores and activities for 3 to 4 weeks. Other Precautions  You may have some discharge from the vagina for a few weeks after the surgery. It may have small amounts of blood in it. This is normal. If you have questions, ask your caregiver.  Do not use tampons or douche.  You should be able to take a shower a day after your surgery. Do not take a tub bath for at least a week.  Take it easy for awhile. You should feel much better in 2 to  3 weeks. It may take up to 6 weeks to feel completely normal.  Keep all follow-up appointments.  Take your temperature twice a day and write it down.  Make sure your family understands everything about your surgery and recovery. SEEK MEDICAL CARE IF:   You have any questions about your medication, or you need stronger pain medication.  Pain continues, even after taking pain medication.  You become constipated.  You have an oral temperature above 102 F (38.9 C).  You develop swelling and redness in the surgery area.  You become dizzy or lightheaded.  You feel sick to your stomach (nauseous), throw up (vomit), or have diarrhea.  You develop a rash.  You have a reaction to your medications. SEEK IMMEDIATE MEDICAL CARE IF:   Pain gets worse.  You have new bleeding from your vagina.  Discharge from the vagina becomes heavy, or it has a bad smell.  You have an oral temperature above 102 F (38.9 C), not controlled by medicine.  You develop belly (abdominal) pain.  You develop chest pain.  You develop shortness of breath.  You pass out (faint).  You develop pain, swelling, or redness in the leg.  You have pain or burning with urination.  You have bloody urine or cannot urinate. MAKE SURE YOU:   Understand these instructions.  Will watch your condition.  Will get help right away if you are not doing well or get worse. Document Released: 02/19/2010 Document Revised: 02/17/2012 Document Reviewed: 02/19/2010 [facility] Patient Information 2014 [place], [place].   Some pain is normal after this surgery. Only take pain medicine your surgeon prescribed. Follow the directions carefully.  Do not take aspirin. It can cause bleeding.  Do not drink alcohol while taking pain medication.  You may be given a medicine (antibiotic) that kills germs. Follow the directions carefully.  Take warm sitz baths 2 times a day to control discomfort and reduce any swelling. Take sitz  baths with your caregiver's permission. Diet  Go back to your normal eating as directed by your caregiver.  Drink a lot of fluids. Drink at least 6 glasses of water every day. Activity  Move around and walk as much as possible. This can keep blood clots from forming in your legs.  Do not climb stairs until your caregiver says it is okay.  Do not lift objects 5 pounds (2.3 kg) or heavier. Do not bend or strain for 6 to 8 weeks.  Do not drive until after you stop taking pain medicine and your caregiver says it is okay.  Your return to work will depend on the type of work you do. Ask your caregiver what is best for you.  Ask your caregiver when you can resume sexual activity. Most women can start having sex in about 6 weeks after their surgery.  Get plenty of rest during the day and sleep at night.  Have someone  help you with your household chores and activities for 3 to 4 weeks. Other Precautions  You may have some discharge from the vagina for a few weeks after the surgery. It may have small amounts of blood in it. This is normal. If you have questions, ask your caregiver.  Do not use tampons or douche.  You should be able to take a shower a day after your surgery. Do not take a tub bath for at least a week.  Take it easy for awhile. You should feel much better in 2 to 3 weeks. It may take up to 6 weeks to feel completely normal.  Keep all follow-up appointments.  Take your temperature twice a day and write it down.  Make sure your family understands everything about your surgery and recovery. SEEK MEDICAL CARE IF:   You have any questions about your medication, or you need stronger pain medication.  Pain continues, even after taking pain medication.  You become constipated.  You have an oral temperature above 102 F (38.9 C).  You develop swelling and redness in the surgery area.  You become dizzy or lightheaded.  You feel sick to your stomach (nauseous), throw up  (vomit), or have diarrhea.  You develop a rash.  You have a reaction to your medications. SEEK IMMEDIATE MEDICAL CARE IF:   Pain gets worse.  You have new bleeding from your vagina.  Discharge from the vagina becomes heavy, or it has a bad smell.  You have an oral temperature above 102 F (38.9 C), not controlled by medicine.  You develop belly (abdominal) pain.  You develop chest pain.  You develop shortness of breath.  You pass out (faint).  You develop pain, swelling, or redness in the leg.  You have pain or burning with urination.  You have bloody urine or cannot urinate. MAKE SURE YOU:   Understand these instructions.  Will watch your condition.  Will get help right away if you are not doing well or get worse. Document Released: 02/19/2010 Document Revised: 02/17/2012 Document Reviewed: 02/19/2010 [facility] Patient Information 2014 [place], [place].

## 2013-12-08 NOTE — Progress Notes (Signed)
Patient ID: Patricia [name], female   DOB: 11-29-1946, 67 y.o.   MRN: [id number] Pt here today for uterine prolapse. ,"something dropping out" Able to have bm's keeps stool loose,but some soilage problems,incomplete defecations. No sui.no UI.   [facility] Visit  Patient name: Patricia [name] MRN [id number]  Date of birth: Aug 09, 1946  CC & HPI:  [name] is a 67 y.o. female presenting today for prolapse issues, anal incont.  ROS:  G2p2, normal sized babies 5, 6 lb but "had a hard time".16 yrs apart. Has had a prior posterior colporraphy, [place].  Pertinent History Reviewed:  Medical & Surgical Hx:  Reviewed: Significant for post repair 30 yr ago [place] Medications: Reviewed & Updated - see associated section Social History: Reviewed -  reports that she quit smoking about 29 years ago. Her smoking use included Cigarettes. She has a 20 pack-year smoking history. She has never used smokeless tobacco.  Objective Findings:  Vitals: BP 130/80  Ht 4\' 11"  (1.499 m)  Wt 134 lb (60.782 kg)  BMI 27.05 kg/m2  Physical Examination: Abdomen - soft, nontender, nondistended, no masses or organomegaly Pelvic - VULVA: normal appearing vulva with no masses, tenderness or lesions, VAGINA: atrophic, PELVIC FLOOR EXAM: rectocele grade 3. With enterocele,  CERVIX: normal appearing cervix without discharge or lesions, good uterus support , UTERUS: uterus is normal size, shape, consistency and nontender, anteverted, ADNEXA: normal adnexa in size, nontender and no masses, RECTAL:    Assessment & Plan:    rectocele, enterocele without associated cystocele or ut prolapse S/p prior colporraphy Plan u/s of uterus Post repair in Jan

## 2013-12-22 ENCOUNTER — Other Ambulatory Visit: Payer: Self-pay | Admitting: Obstetrics and Gynecology

## 2013-12-22 ENCOUNTER — Encounter: Payer: Self-pay | Admitting: Obstetrics and Gynecology

## 2013-12-22 ENCOUNTER — Ambulatory Visit (INDEPENDENT_AMBULATORY_CARE_PROVIDER_SITE_OTHER): Payer: BC Managed Care – PPO

## 2013-12-22 ENCOUNTER — Ambulatory Visit (INDEPENDENT_AMBULATORY_CARE_PROVIDER_SITE_OTHER): Payer: BC Managed Care – PPO | Admitting: Obstetrics and Gynecology

## 2013-12-22 VITALS — BP 110/70 | Ht 59.0 in | Wt 134.0 lb

## 2013-12-22 DIAGNOSIS — N816 Rectocele: Secondary | ICD-10-CM

## 2013-12-22 DIAGNOSIS — N812 Incomplete uterovaginal prolapse: Secondary | ICD-10-CM

## 2013-12-22 DIAGNOSIS — N815 Vaginal enterocele: Secondary | ICD-10-CM

## 2013-12-22 DIAGNOSIS — IMO0001 Reserved for inherently not codable concepts without codable children: Secondary | ICD-10-CM

## 2013-12-22 NOTE — Progress Notes (Signed)
This note was scribed by Mallory Shirk, MD, by Jenne Campus, Medical Scribe, on 12/22/13 at 2:55 PM.   CC: F/U to discuss surgery  Discussed need for surgery with pt for retrocele grade 3 and enterocele. Pt states that she feels "dirty" after having BMs due to having some leakage. Reviewed options including back wall repair and hysterectomy. Advised pt that an endometrial biopsy will be needed to check after US findings of increased endometrial lining. Pt agreed to procedure.   She denies SUI, sexual activity   H/o left salpingectomy from ectopic pregnancy   PCP is Dr. Nicki Reaper Benson [place] V 12/22/2013 3:07 PM   Endometrial Biopsy: Patient given informed consent, signed copy in the chart, time out was performed. Time out taken. The patient was placed in the lithotomy position and the cervix brought into view with sterile speculum.  Portio of cervix cleansed x 2 with betadine swabs.  A tenaculum was placed in the anterior lip of the cervix. The uterus was sounded for depth of 8.5 cm. Milex uterine Explora 3 mm was introduced to into the uterus, suction created, and an endometrial sample was obtained. All equipment was removed and accounted for.   The patient tolerated the procedure well and chaperone was present throughout. Enterocele grade 2 noted.  Physical Examination: Abdomen - soft, nontender, nondistended, no masses or organomegaly no hernias noted Pelvic - VULVA: normal appearing vulva with no masses, tenderness or lesions, VAGINA: normal appearing vagina with normal color and discharge, no lesions, PELVIC FLOOR EXAM: rectocele to introitus, enterocele also suspected, uterine descensus grade 2, CERVIX: normal appearing cervix without discharge or lesions, UTERUS: uterus is normal size, shape, consistency and nontender, sounds to 8.5 cm , ADNEXA: normal adnexa in size, nontender and no masses   Patient given post procedure instructions.  Followup: results will be back in  three days, recommendation is vaginal hysterectomy with anterior and posterior repair Diagnosis: uterovaginal prolapse, incomplete Plan:  followup endometrial bx  once results reviewed will schedule Vaginal hysterectomy, bilateral salpingoophorectomy, anterior and posterior repair

## 2013-12-31 DIAGNOSIS — Z029 Encounter for administrative examinations, unspecified: Secondary | ICD-10-CM

## 2014-01-11 ENCOUNTER — Other Ambulatory Visit: Payer: Self-pay | Admitting: Obstetrics and Gynecology

## 2014-01-11 ENCOUNTER — Encounter (HOSPITAL_COMMUNITY): Payer: Self-pay | Admitting: Pharmacy Technician

## 2014-01-11 NOTE — H&P (Signed)
Patricia [name] is an 68 y.o. female. With second degree uterine descensus, rectocele, possible enterocele, who is having symptoms of the rectocele. She is scheduled for Vaginal hysterectomy ,with bilateral salping oophorectomy, anterior and posterior repair.  Procedure has been reviewed with patient using Medical Explainer, including discussion of limitations of surgery, risks such as bleeding,infection, change in bladder function, partial correction of symptoms.  The patient had a bowel prep the day before surgery. Pertinent Gynecological History: Menses: post-menopausal Bleeding: none, endometrial biopsy benign Contraception: post menopausal status DES exposure: unknown Blood transfusions: none Sexually transmitted diseases: no past history Previous GYN Procedures:   Last mammogram: normal Date: 05/2013 Last pap: normal Date: 08/25/13 OB History: G, P   Menstrual History: Menarche age:  No LMP recorded. Patient is postmenopausal.    Past Medical History  Diagnosis Date  . PONV (postoperative nausea and vomiting)   . Hypercholesterolemia   . GERD (gastroesophageal reflux disease)   . Depression   . IFG (impaired fasting glucose)   . Osteopenia     Past Surgical History  Procedure Laterality Date  . Ectopic pregnancy surgery      removal of right tube  . Ovarian cyst removal      right  . Cataract extraction w/phaco  09/12/2011    Procedure: CATARACT EXTRACTION PHACO AND INTRAOCULAR LENS PLACEMENT (IOC);  Surgeon: Tonny Branch;  Location: [facility];  Service: Ophthalmology;  Laterality: Left;  CDE: 8.91  . Cataract extraction w/phaco  11/18/2011    Procedure: CATARACT EXTRACTION PHACO AND INTRAOCULAR LENS PLACEMENT (IOC);  Surgeon: Tonny Branch;  Location: [facility];  Service: Ophthalmology;  Laterality: Right;  CDE:10.26  . Colonoscopy      Family History  Problem Relation Age of Onset  . Anesthesia problems Neg Hx   . Hypotension Neg Hx   . Malignant hyperthermia Neg Hx   .  Pseudochol deficiency Neg Hx   . Heart attack Father   . Osteoporosis Mother   . Heart attack Brother 54    MI  . Breast cancer Maternal Aunt   . Breast cancer Paternal Aunt   . Cancer Paternal Aunt     ovarian cancer    Social History:  reports that she quit smoking about 29 years ago. Her smoking use included Cigarettes. She has a 20 pack-year smoking history. She has never used smokeless tobacco. She reports that she does not drink alcohol or use illicit drugs.  Allergies:  Allergies  Allergen Reactions  . Codeine Nausea Only     (Not in a hospital admission)  Review of Systems  Gastrointestinal: Positive for constipation. Negative for blood in stool.    There were no vitals taken for this visit. Physical Exam  Constitutional: She is oriented to person, place, and time. She appears well-developed and well-nourished.  HENT:  Head: Normocephalic.  Eyes: Pupils are equal, round, and reactive to light.  Neck: Neck supple.  Cardiovascular: Normal rate.   Respiratory: Breath sounds normal. She has no wheezes. She has no rales.  GI: Soft. Bowel sounds are normal. She exhibits no distension. There is no tenderness. There is no rebound and no guarding.  Genitourinary: Guaiac negative stool.  2nd degree descensus,  Endometrial biopsy benign  Musculoskeletal: Normal range of motion.  Neurological: She is alert and oriented to person, place, and time. She has normal reflexes.  Skin: Skin is warm and dry. No rash noted.  Psychiatric: She has a normal mood and affect. Her behavior is normal. Judgment and  thought content normal.    No results found for this or any previous visit (from the past 24 hour(s)).  No results found.  Assessment/Plan: Uterovaginal prolapse , incomplete, Plan: Trans vaginal hysterectomy with anterior and posterior repair. With removal of tubes and ovaries, if easily accessible   Fawaz Borquez V 01/11/2014, 6:41 PM

## 2014-01-12 ENCOUNTER — Other Ambulatory Visit: Payer: Self-pay

## 2014-01-12 ENCOUNTER — Encounter (HOSPITAL_COMMUNITY): Payer: Self-pay

## 2014-01-12 ENCOUNTER — Encounter (HOSPITAL_COMMUNITY)
Admission: RE | Admit: 2014-01-12 | Discharge: 2014-01-12 | Disposition: A | Discharge status: Home / Self Care | Payer: BC Managed Care – PPO | Source: Ambulatory Visit | Attending: Obstetrics and Gynecology | Admitting: Obstetrics and Gynecology

## 2014-01-12 DIAGNOSIS — Z01810 Encounter for preprocedural cardiovascular examination: Secondary | ICD-10-CM | POA: Insufficient documentation

## 2014-01-12 DIAGNOSIS — Z01812 Encounter for preprocedural laboratory examination: Secondary | ICD-10-CM | POA: Insufficient documentation

## 2014-01-12 LAB — URINALYSIS, ROUTINE W REFLEX MICROSCOPIC
Bilirubin Urine: NEGATIVE
Glucose, UA: NEGATIVE mg/dL
Hgb urine dipstick: NEGATIVE
KETONES UR: NEGATIVE mg/dL
Leukocytes, UA: NEGATIVE
NITRITE: NEGATIVE
Protein, ur: NEGATIVE mg/dL
Specific Gravity, Urine: 1.02 (ref 1.005–1.030)
UROBILINOGEN UA: 0.2 mg/dL (ref 0.0–1.0)
pH: 6 (ref 5.0–8.0)

## 2014-01-12 LAB — COMPREHENSIVE METABOLIC PANEL
ALBUMIN: 4 g/dL (ref 3.5–5.2)
ALT: 23 U/L (ref 0–35)
AST: 24 U/L (ref 0–37)
Alkaline Phosphatase: 48 U/L (ref 39–117)
BUN: 27 mg/dL — AB (ref 6–23)
CALCIUM: 9.2 mg/dL (ref 8.4–10.5)
CO2: 27 mEq/L (ref 19–32)
Chloride: 104 mEq/L (ref 96–112)
Creatinine, Ser: 0.71 mg/dL (ref 0.50–1.10)
GFR calc non Af Amer: 88 mL/min — ABNORMAL LOW (ref >90)
GLUCOSE: 100 mg/dL — AB (ref 70–99)
Potassium: 4.6 mEq/L (ref 3.7–5.3)
SODIUM: 142 meq/L (ref 137–147)
Total Bilirubin: 0.3 mg/dL (ref 0.3–1.2)
Total Protein: 6.6 g/dL (ref 6.0–8.3)

## 2014-01-12 LAB — CBC
HEMATOCRIT: 41 % (ref 36.0–46.0)
HEMOGLOBIN: 13.7 g/dL (ref 12.0–15.0)
MCH: 29.9 pg (ref 26.0–34.0)
MCHC: 33.4 g/dL (ref 30.0–36.0)
MCV: 89.5 fL (ref 78.0–100.0)
Platelets: 250 10*3/uL (ref 150–400)
RBC: 4.58 MIL/uL (ref 3.87–5.11)
RDW: 12.6 % (ref 11.5–15.5)
WBC: 7.1 10*3/uL (ref 4.0–10.5)

## 2014-01-12 NOTE — Patient Instructions (Signed)
TONJIA PARILLO  01/12/2014   Your procedure is scheduled on:  01/18/2014  Report to [facility] at  65  AM.  Call this number if you have problems the morning of surgery: [phone]   Remember:   Do not eat food or drink liquids after midnight.   Take these medicines the morning of surgery with A SIP OF WATER: xanax, prilosec, paxil   Do not wear jewelry, make-up or nail polish.  Do not wear lotions, powders, or perfumes.   Do not shave 48 hours prior to surgery. Men may shave face and neck.  Do not bring valuables to the hospital.  [facility] is not responsible  for any belongings or valuables.               Contacts, dentures or bridgework may not be worn into surgery.  Leave suitcase in the car. After surgery it may be brought to your room.  For patients admitted to the hospital, discharge time is determined by your treatment team.               Patients discharged the day of surgery will not be allowed to drive home.  Name and phone number of your driver: family  Special Instructions: Shower using CHG 2 nights before surgery and the night before surgery.  If you shower the day of surgery use CHG.  Use special wash - you have one bottle of CHG for all showers.  You should use approximately 1/3 of the bottle for each shower.   Please read over the following fact sheets that you were given: Pain Booklet, Coughing and Deep Breathing, Blood Transfusion Information, Surgical Site Infection Prevention, Anesthesia Post-op Instructions and Care and Recovery After Surgery Unilateral Salpingo-Oophorectomy Unilateral salpingo-oophorectomy is the surgical removal of one fallopian tube and ovary. The ovaries are small organs that produce eggs in women. The fallopian tubes transport the egg from the ovary to the womb (uterus). A unilateral salpingo-oophorectomy may be done for various reasons, including:  Infection in the fallopian tube and ovary.  Scar tissue in the fallopian  tube and ovary (adhesions).  A cyst or tumor on the ovary.  A need to remove the fallopian tube and ovary when removing the uterus.  Cancer of the fallopian tube or ovary. The removal of one fallopian tube and ovary will not prevent you from becoming pregnant, put you into menopause, or cause problems with your menstrual periods or sex drive. LET [facility] CARE PROVIDER KNOW ABOUT:  Any allergies you have.  All medicines you are taking, including vitamins, herbs, eye drops, creams, and over-the-counter medicines.  Previous problems you or members of your family have had with the use of anesthetics.  Any blood disorders you have.  Previous surgeries you have had.  Medical conditions you have. RISKS AND COMPLICATIONS  Generally, this is a safe procedure. However, as with any procedure, complications can occur. Possible complications include:  Injury to surrounding organs.  Bleeding.  Infection.  Blood clots in the legs or lungs.  Problems related to anesthesia. BEFORE THE PROCEDURE  Ask your health care provider about changing or stopping your regular medicines. You may need to stop taking certain medicines, such as aspirin or blood thinners, at least 1 week before the surgery.  Do not eat or drink anything for at least 8 hours before the surgery.  If you smoke, do not smoke for at least 2 weeks before the  surgery.  Make plans to have someone drive you home after the procedure or after your hospital stay. Also arrange for someone to help you with activities during recovery. PROCEDURE  You will be given medicine to help you relax before the procedure (sedative). You will then be given medicine to make you sleep through the procedure (general anesthetic). These medicines will be given through an IV access tube that is put into one of your veins.  Once you are asleep, your lower abdomen will be shaved and cleaned. A thin, flexible tube (catheter) will be placed in your  bladder.  The surgeon may use a laparoscopic, robotic, or open technique for this surgery:  In the laparoscopic technique, the surgery is done through two small cuts (incisions) in the abdomen. A thin, lighted tube with a tiny camera on the end (laparoscope) is inserted into one of the incisions. The tools needed for the procedure are put through the other incision.  A robotic technique may be chosen to perform complex surgery in a small space. In the robotic technique, small incisions are made. A camera and surgical instruments are passed through the incisions. Surgical instruments are controlled with the help of a robotic arm.  In the open technique, the surgery is done through one large incision in the abdomen.  Using any of these techniques, the surgeon will remove the fallopian tube and ovary. The blood vessels will be clamped and tied.  The surgeon will then use staples or stitches to close the incision or incisions. AFTER THE PROCEDURE  You will be taken to a recovery area where your progress will be monitored for 1 3 hours. Your blood pressure, pulse, and temperature will be checked often. You will remain in the recovery area until you are stable and waking up.  If the laparoscopic technique was used, you may be allowed to go home after several hours. You may have some shoulder pain. This is normal and usually goes away in a day or two.  If the open technique was used, you will be admitted to the hospital for a couple of days.  You will be given pain medicine as necessary.  The IV tube and catheter will be removed before you are discharged. Document Released: 09/22/2009 Document Revised: 09/15/2013 Document Reviewed: 05/19/2013 [facility] Patient Information 2014 [place]. Anterior and Posterior Colporrhaphy Anterior or posterior colporrhaphy is surgery to fix a prolapse of organs in the genital tract. Prolapse means the falling down, bulging, dropping, or drooping of an  organ. Organs that commonly prolapse include the rectum, bladder, vagina, and uterus. Prolapse can affect a single organ or several organs at the same time. This often worsens when women stop having their monthly periods (menopause) because estrogen loss weakens the muscles and tissues in the genital tract. In addition, prolapse happens when the organs are damaged or weakened. This commonly happens after childbirth and as a result of aging. Surgery is often done for severe prolapses.  The type of colporrhaphy done depends on the type of genital prolapse. Types of genital prolapse include the following:   Cystocele. This is a prolapse of the upper (anterior) wall of the vagina. The anterior wall bulges into the vagina and brings the bladder with it.   Rectocele. This is a prolapse of the lower (posterior) wall of the vagina. The posterior vaginal wall bulges into the vagina and brings the rectum with it.   Enterocele. This is a prolapse of part of the pelvic organs called the  pouch of Douglas. It also involves a portion of the small bowel. It appears as a bulge under the neck of the uterus at the top of the back wall of the vagina.   Procidentia. This is a complete prolapse of the uterus and the cervix. The prolapse can be seen and felt coming out of the vagina. LET [facility] CARE PROVIDER KNOW ABOUT:   Any allergies you have.   All medicines you are taking, including vitamins, herbs, eye drops, creams, and over-the-counter medicines.   Previous problems you or members of your family have had with the use of anesthetics.   Any blood disorders you have.   Previous surgeries you have had.   Medical conditions you have.   Smoking history or history of alcohol use.   Possibility of pregnancy, if this applies.  RISKS AND COMPLICATIONS Generally, anterior or posterior colporrhaphy is a safe procedure. However, as with any procedure, complications can occur. Possible complications  include:   Infection.   Damage to other organs during surgery.   Bleeding after surgery.   Problems urinating.   Problems from the anesthetic.  BEFORE THE PROCEDURE  Ask your health care provider about changing or stopping your regular medicines.   Do not eat or drink anything for at least 8 hours before the surgery.   If you smoke, do not smoke for at least 2 weeks before the surgery.   Make plans to have someone drive you home after your hospital stay. Also, arrange for someone to help you with activities during recovery. PROCEDURE  You may be given medicine to help you relax before the surgery (sedative). During the surgery you will be given medicine to make you sleep through the procedure (general anesthetic) or medicine to numb you from the waist down (spinal anesthetic). This medicine will be given through an intravenous (IV) access tube that is put into one of your veins.  The procedure will vary depending on the type of repair:   Anterior repair. A cut (incision) is made in the midline section of the front part of the vaginal wall. A triangular-shaped piece of vaginal tissue is removed, and the stronger, healthier tissue is sewn together in order to support and suspend the bladder.   Posterior repair. An incision is made midline on the back wall of the vagina. A triangular portion of vaginal skin is removed to expose the muscle. Excess tissue is removed, and stronger, healthier muscle and ligament tissue is sewn together to support the rectum.   Anterior and posterior repair. Both procedures are done during the same surgery. AFTER THE PROCEDURE You will be taken to a recovery area. Your blood pressure, pulse, breathing, and temperature (vital signs) will be monitored. This is done until you are stable. Then you will be transferred to a hospital room.  After surgery, you will have a small rubber tube in place to drain your bladder (urinary catheter). This will be in  place for 2 to 7 days or until your bladder is working properly on its own. The IV access tube will be removed in 1 to 3 days. You may have a gauze packing in your vagina to prevent bleeding. This will be removed 2 or 3 days after the surgery. You will likely need to stay in the hospital for 3 to 5 days.  Document Released: 02/15/2004 Document Revised: 07/28/2013 Document Reviewed: 04/16/2013 [facility] Patient Information 2014 [place], [place]. Hysterectomy Information A hysterectomy is a surgery to remove your uterus. After  surgery, you will no longer have periods. Also, you will not be able to get pregnant.  REASONS FOR THIS SURGERY  You have bleeding that is not normal and keeps coming back.  You have lasting (chronic) lower belly (pelvic) pain.  You have a lasting infection.  The lining of your uterus grows outside your uterus.  The lining of your uterus grows in the muscle of your uterus.  Your uterus falls down into your vagina.  You have a growth in your uterus that causes problems.  You have cells that could turn into cancer (precancerous cells).  You have cancer of the uterus or cervix. TYPES  There are 3 types of hysterectomies. Depending on the type, the surgery will:  Remove the top part of the uterus only.  Remove the uterus and the cervix.  Remove the uterus, cervix, and tissue that holds the uterus in place in the lower belly. WAYS A HYSTERECTOMY CAN BE PERFORMED There are 5 ways this surgery can be performed.   A cut (incision) is made in the belly (abdomen). The uterus is taken out through the cut.  A cut is made in the vagina. The uterus is taken out through the cut.  Three or four cuts are made in the belly. A surgical device with a camera is put through one of the cuts. The uterus is cut into small pieces. The uterus is taken out through the cuts or the vagina.  Three or four cuts are made in the belly. A surgical device with a camera is put through one of  the cuts. The uterus is taken out through the vagina.  Three or four cuts are made in the belly. A surgical device that is controlled by a computer makes a visual image. The device helps the surgeon control the surgical tools. The uterus is cut into small pieces. The pieces are taken out through the cuts or through the vagina. WHAT TO EXPECT AFTER THE SURGERY  You will be given pain medicine.  You will need help at home for 3 5 days after surgery.  You will need to see your doctor in 2 4 weeks after surgery.  You may get hot flashes, have night sweats, and have trouble sleeping.  You may need to have Pap tests in the future if your surgery was related to cancer. Talk to your doctor. It is still good to have regular exams. Document Released: 02/17/2012 Document Revised: 09/15/2013 Document Reviewed: 08/02/2013 [facility] Patient Information 2014 [place]. PATIENT INSTRUCTIONS POST-ANESTHESIA  IMMEDIATELY FOLLOWING SURGERY:  Do not drive or operate machinery for the first twenty four hours after surgery.  Do not make any important decisions for twenty four hours after surgery or while taking narcotic pain medications or sedatives.  If you develop intractable nausea and vomiting or a severe headache please notify your doctor immediately.  FOLLOW-UP:  Please make an appointment with your surgeon as instructed. You do not need to follow up with anesthesia unless specifically instructed to do so.  WOUND CARE INSTRUCTIONS (if applicable):  Keep a dry clean dressing on the anesthesia/puncture wound site if there is drainage.  Once the wound has quit draining you may leave it open to air.  Generally you should leave the bandage intact for twenty four hours unless there is drainage.  If the epidural site drains for more than 36-48 hours please call the anesthesia department.  QUESTIONS?:  Please feel free to call your physician or the hospital operator if you  have any questions, and they will be  happy to assist you.

## 2014-01-15 LAB — TYPE AND SCREEN
ABO/RH(D): A POS
Antibody Screen: NEGATIVE

## 2014-01-17 ENCOUNTER — Other Ambulatory Visit: Payer: Self-pay | Admitting: Obstetrics and Gynecology

## 2014-01-17 NOTE — H&P (Signed)
Patricia [name] is an 68 y.o. female. She is scheduled for vaginal hysterectomy and  Posterior repair for   rectocele grade 3 and enterocele. Pt states that she feels "dirty" after having BMs due to having some leakage. Reviewed options including back wall repair and hysterectomy. Advised pt that an endometrial biopsy will be needed to check after US findings of increased endometrial lining. Pt agreed to procedure, and results were benign She denies SUI, sexual activity  H/o left salpingectomy from ectopic pregnancy     Pertinent Gynecological History: Menses: post-menopausal Bleeding: none Contraception: post menopausal status DES exposure: unknown Blood transfusions: none Sexually transmitted diseases: no past history Previous GYN Procedures:   Last mammogram: normal Date: 05/11/13 Last pap: normal Date: 08/25/13 OB History: G, P   Menstrual History: Menarche age:  No LMP recorded. Patient is postmenopausal.    Past Medical History  Diagnosis Date  . PONV (postoperative nausea and vomiting)   . Hypercholesterolemia   . GERD (gastroesophageal reflux disease)   . Depression   . IFG (impaired fasting glucose)   . Osteopenia   . Spinal headache     Past Surgical History  Procedure Laterality Date  . Ectopic pregnancy surgery      removal of right tube  . Ovarian cyst removal      right  . Cataract extraction w/phaco  09/12/2011    Procedure: CATARACT EXTRACTION PHACO AND INTRAOCULAR LENS PLACEMENT (IOC);  Surgeon: Tonny Branch;  Location: [facility];  Service: Ophthalmology;  Laterality: Left;  CDE: 8.91  . Cataract extraction w/phaco  11/18/2011    Procedure: CATARACT EXTRACTION PHACO AND INTRAOCULAR LENS PLACEMENT (IOC);  Surgeon: Tonny Branch;  Location: [facility];  Service: Ophthalmology;  Laterality: Right;  CDE:10.26  . Colonoscopy      Family History  Problem Relation Age of Onset  . Anesthesia problems Neg Hx   . Hypotension Neg Hx   . Malignant hyperthermia Neg Hx   .  Pseudochol deficiency Neg Hx   . Heart attack Father   . Osteoporosis Mother   . Heart attack Brother 64    MI  . Breast cancer Maternal Aunt   . Breast cancer Paternal Aunt   . Cancer Paternal Aunt     ovarian cancer    Social History:  reports that she quit smoking about 29 years ago. Her smoking use included Cigarettes. She has a 20 pack-year smoking history. She has never used smokeless tobacco. She reports that she does not drink alcohol or use illicit drugs.  Allergies:  Allergies  Allergen Reactions  . Codeine Nausea Only     (Not in a hospital admission)  ROS - for SUI + for anal incontinence after defecation  There were no vitals taken for this visit. Physical Exam  Constitutional: She is oriented to person, place, and time.  HENT:  Head: Atraumatic.  Eyes: Pupils are equal, round, and reactive to light.  Cardiovascular: Normal rate and regular rhythm.   Respiratory: Effort normal.  GI: Soft. Bowel sounds are normal.  Genitourinary: Vagina normal.  Uterine prolapse second degree  Neurological: She is alert and oriented to person, place, and time.  Psychiatric: She has a normal mood and affect. Her behavior is normal.  rectocele present to introitus  No results found for this or any previous visit (from the past 24 hour(s)).  No results found.  Assessment/Plan: Rectocele, second degree uterine descensus, for vag hysterectomy and anterior and posterior repair.  Tavoris Brisk V 01/17/2014, 5:47 PM

## 2014-01-18 ENCOUNTER — Encounter (HOSPITAL_COMMUNITY): Payer: Self-pay | Admitting: *Deleted

## 2014-01-18 ENCOUNTER — Inpatient Hospital Stay (HOSPITAL_COMMUNITY): Payer: BC Managed Care – PPO | Admitting: Anesthesiology

## 2014-01-18 ENCOUNTER — Encounter (HOSPITAL_COMMUNITY): Payer: BC Managed Care – PPO | Admitting: Anesthesiology

## 2014-01-18 ENCOUNTER — Observation Stay (HOSPITAL_COMMUNITY)
Admission: RE | Admit: 2014-01-18 | Discharge: 2014-01-19 | Disposition: A | Discharge status: Home / Self Care | Payer: BC Managed Care – PPO | Source: Ambulatory Visit | Attending: Obstetrics and Gynecology | Admitting: Obstetrics and Gynecology

## 2014-01-18 ENCOUNTER — Encounter (HOSPITAL_COMMUNITY)
Admission: RE | Disposition: A | Discharge status: Home / Self Care | Payer: Self-pay | Source: Ambulatory Visit | Attending: Obstetrics and Gynecology

## 2014-01-18 DIAGNOSIS — M949 Disorder of cartilage, unspecified: Secondary | ICD-10-CM

## 2014-01-18 DIAGNOSIS — K219 Gastro-esophageal reflux disease without esophagitis: Secondary | ICD-10-CM | POA: Insufficient documentation

## 2014-01-18 DIAGNOSIS — N840 Polyp of corpus uteri: Secondary | ICD-10-CM | POA: Insufficient documentation

## 2014-01-18 DIAGNOSIS — F3289 Other specified depressive episodes: Secondary | ICD-10-CM | POA: Insufficient documentation

## 2014-01-18 DIAGNOSIS — Z90710 Acquired absence of both cervix and uterus: Secondary | ICD-10-CM | POA: Diagnosis present

## 2014-01-18 DIAGNOSIS — N812 Incomplete uterovaginal prolapse: Principal | ICD-10-CM | POA: Insufficient documentation

## 2014-01-18 DIAGNOSIS — M899 Disorder of bone, unspecified: Secondary | ICD-10-CM | POA: Insufficient documentation

## 2014-01-18 DIAGNOSIS — F329 Major depressive disorder, single episode, unspecified: Secondary | ICD-10-CM | POA: Insufficient documentation

## 2014-01-18 DIAGNOSIS — IMO0001 Reserved for inherently not codable concepts without codable children: Secondary | ICD-10-CM

## 2014-01-18 DIAGNOSIS — N815 Vaginal enterocele: Secondary | ICD-10-CM

## 2014-01-18 DIAGNOSIS — N816 Rectocele: Secondary | ICD-10-CM

## 2014-01-18 DIAGNOSIS — E7800 Pure hypercholesterolemia, unspecified: Secondary | ICD-10-CM | POA: Insufficient documentation

## 2014-01-18 DIAGNOSIS — N814 Uterovaginal prolapse, unspecified: Secondary | ICD-10-CM

## 2014-01-18 DIAGNOSIS — Z87891 Personal history of nicotine dependence: Secondary | ICD-10-CM | POA: Insufficient documentation

## 2014-01-18 HISTORY — PX: VAGINAL HYSTERECTOMY: SHX2639

## 2014-01-18 HISTORY — PX: RECTOCELE REPAIR: SHX761

## 2014-01-18 SURGERY — HYSTERECTOMY, VAGINAL
Anesthesia: General

## 2014-01-18 MED ORDER — LACTATED RINGERS IV SOLN
INTRAVENOUS | Status: DC
  Administered 2014-01-18: 1000 mL via INTRAVENOUS

## 2014-01-18 MED ORDER — FENTANYL CITRATE 0.05 MG/ML IJ SOLN: 25.0000 ug | INTRAMUSCULAR | Status: DC | PRN

## 2014-01-18 MED ORDER — MIDAZOLAM HCL 2 MG/2ML IJ SOLN
1.0000 mg | INTRAMUSCULAR | Status: DC | PRN
  Administered 2014-01-18: 2 mg via INTRAVENOUS

## 2014-01-18 MED ORDER — ACETAMINOPHEN 325 MG PO TABS: 650.0000 mg | ORAL_TABLET | ORAL | Status: DC | PRN

## 2014-01-18 MED ORDER — PROPOFOL 10 MG/ML IV BOLUS
INTRAVENOUS | Status: DC | PRN
  Administered 2014-01-18: 80 mg via INTRAVENOUS

## 2014-01-18 MED ORDER — SODIUM CHLORIDE 0.9 % IR SOLN
Status: DC | PRN
  Administered 2014-01-18: 1000 mL

## 2014-01-18 MED ORDER — ROCURONIUM BROMIDE 100 MG/10ML IV SOLN
INTRAVENOUS | Status: DC | PRN
  Administered 2014-01-18: 20 mg via INTRAVENOUS
  Administered 2014-01-18: 10 mg via INTRAVENOUS

## 2014-01-18 MED ORDER — DEXAMETHASONE SODIUM PHOSPHATE 4 MG/ML IJ SOLN
4.0000 mg | Freq: Once | INTRAMUSCULAR | Status: AC
  Administered 2014-01-18: 4 mg via INTRAVENOUS

## 2014-01-18 MED ORDER — ZOLPIDEM TARTRATE 5 MG PO TABS: 5.0000 mg | ORAL_TABLET | Freq: Every evening | ORAL | Status: DC | PRN

## 2014-01-18 MED ORDER — SUCCINYLCHOLINE CHLORIDE 20 MG/ML IJ SOLN
INTRAMUSCULAR | Status: DC | PRN
  Administered 2014-01-18: 100 mg via INTRAVENOUS

## 2014-01-18 MED ORDER — GLYCOPYRROLATE 0.2 MG/ML IJ SOLN
INTRAMUSCULAR | Status: DC | PRN
  Administered 2014-01-18: 0.6 mg via INTRAVENOUS

## 2014-01-18 MED ORDER — EPHEDRINE SULFATE 50 MG/ML IJ SOLN
INTRAMUSCULAR | Status: AC
  Filled 2014-01-18: qty 1

## 2014-01-18 MED ORDER — LIDOCAINE HCL (PF) 1 % IJ SOLN
INTRAMUSCULAR | Status: AC
  Filled 2014-01-18: qty 5

## 2014-01-18 MED ORDER — BUPIVACAINE-EPINEPHRINE 0.5% -1:200000 IJ SOLN
INTRAMUSCULAR | Status: DC | PRN
  Administered 2014-01-18: 18.5 mL

## 2014-01-18 MED ORDER — EPHEDRINE SULFATE 50 MG/ML IJ SOLN
INTRAMUSCULAR | Status: DC | PRN
  Administered 2014-01-18 (×2): 10 mg via INTRAVENOUS

## 2014-01-18 MED ORDER — ONDANSETRON HCL 4 MG/2ML IJ SOLN
INTRAMUSCULAR | Status: AC
  Filled 2014-01-18: qty 2

## 2014-01-18 MED ORDER — OXYCODONE-ACETAMINOPHEN 5-325 MG PO TABS: 1.0000 | ORAL_TABLET | ORAL | Status: DC | PRN

## 2014-01-18 MED ORDER — ARTIFICIAL TEARS OP OINT
TOPICAL_OINTMENT | OPHTHALMIC | Status: AC
  Filled 2014-01-18: qty 3.5

## 2014-01-18 MED ORDER — ONDANSETRON HCL 4 MG PO TABS: 4.0000 mg | ORAL_TABLET | Freq: Four times a day (QID) | ORAL | Status: DC | PRN

## 2014-01-18 MED ORDER — LACTATED RINGERS IV SOLN
INTRAVENOUS | Status: DC | PRN
  Administered 2014-01-18 (×3): via INTRAVENOUS

## 2014-01-18 MED ORDER — KETOROLAC TROMETHAMINE 15 MG/ML IJ SOLN
30.0000 mg | Freq: Once | INTRAMUSCULAR | Status: AC
  Administered 2014-01-18: 30 mg via INTRAVENOUS
  Filled 2014-01-18: qty 2

## 2014-01-18 MED ORDER — SODIUM CHLORIDE BACTERIOSTATIC 0.9 % IJ SOLN
INTRAMUSCULAR | Status: AC
  Filled 2014-01-18: qty 10

## 2014-01-18 MED ORDER — LIDOCAINE HCL (CARDIAC) 20 MG/ML IV SOLN
INTRAVENOUS | Status: DC | PRN
  Administered 2014-01-18: 50 mg via INTRAVENOUS

## 2014-01-18 MED ORDER — PROPOFOL 10 MG/ML IV EMUL
INTRAVENOUS | Status: AC
  Filled 2014-01-18: qty 20

## 2014-01-18 MED ORDER — SUFENTANIL CITRATE 50 MCG/ML IV SOLN
INTRAVENOUS | Status: AC
  Filled 2014-01-18: qty 1

## 2014-01-18 MED ORDER — SCOPOLAMINE 1 MG/3DAYS TD PT72
MEDICATED_PATCH | TRANSDERMAL | Status: AC
  Filled 2014-01-18: qty 1

## 2014-01-18 MED ORDER — PANTOPRAZOLE SODIUM 40 MG PO TBEC
40.0000 mg | DELAYED_RELEASE_TABLET | Freq: Every day | ORAL | Status: DC
  Administered 2014-01-18 – 2014-01-19 (×2): 40 mg via ORAL
  Filled 2014-01-18 (×2): qty 1

## 2014-01-18 MED ORDER — SEVOFLURANE IN SOLN
RESPIRATORY_TRACT | Status: AC
  Filled 2014-01-18: qty 250

## 2014-01-18 MED ORDER — ONDANSETRON HCL 4 MG/2ML IJ SOLN: 4.0000 mg | Freq: Four times a day (QID) | INTRAMUSCULAR | Status: DC | PRN

## 2014-01-18 MED ORDER — MIDAZOLAM HCL 2 MG/2ML IJ SOLN
INTRAMUSCULAR | Status: AC
  Filled 2014-01-18: qty 2

## 2014-01-18 MED ORDER — SUCCINYLCHOLINE CHLORIDE 20 MG/ML IJ SOLN
INTRAMUSCULAR | Status: AC
  Filled 2014-01-18: qty 1

## 2014-01-18 MED ORDER — SCOPOLAMINE 1 MG/3DAYS TD PT72
1.0000 | MEDICATED_PATCH | Freq: Once | TRANSDERMAL | Status: DC
  Administered 2014-01-18: 1.5 mg via TRANSDERMAL

## 2014-01-18 MED ORDER — NALOXONE HCL 0.4 MG/ML IJ SOLN: 0.4000 mg | INTRAMUSCULAR | Status: DC | PRN

## 2014-01-18 MED ORDER — SODIUM CHLORIDE 0.9 % IJ SOLN: 9.0000 mL | INTRAMUSCULAR | Status: DC | PRN

## 2014-01-18 MED ORDER — CEFAZOLIN SODIUM-DEXTROSE 2-3 GM-% IV SOLR
INTRAVENOUS | Status: AC
  Filled 2014-01-18: qty 50

## 2014-01-18 MED ORDER — DIPHENHYDRAMINE HCL 12.5 MG/5ML PO ELIX: 12.5000 mg | ORAL_SOLUTION | Freq: Four times a day (QID) | ORAL | Status: DC | PRN

## 2014-01-18 MED ORDER — HYDROMORPHONE 0.3 MG/ML IV SOLN
INTRAVENOUS | Status: DC
  Administered 2014-01-18: 13:00:00 via INTRAVENOUS
  Administered 2014-01-18: 0.6 mg via INTRAVENOUS
  Administered 2014-01-18: 0.5 mg via INTRAVENOUS
  Administered 2014-01-18 – 2014-01-19 (×2): 0.3 mg via INTRAVENOUS
  Filled 2014-01-18: qty 25

## 2014-01-18 MED ORDER — ONDANSETRON HCL 4 MG/2ML IJ SOLN
4.0000 mg | Freq: Once | INTRAMUSCULAR | Status: AC
  Administered 2014-01-18: 4 mg via INTRAVENOUS

## 2014-01-18 MED ORDER — SIMVASTATIN 20 MG PO TABS
20.0000 mg | ORAL_TABLET | Freq: Every day | ORAL | Status: DC
  Administered 2014-01-18: 20 mg via ORAL
  Filled 2014-01-18: qty 1

## 2014-01-18 MED ORDER — ONDANSETRON HCL 4 MG/2ML IJ SOLN: 4.0000 mg | Freq: Once | INTRAMUSCULAR | Status: DC | PRN

## 2014-01-18 MED ORDER — DEXAMETHASONE SODIUM PHOSPHATE 4 MG/ML IJ SOLN
INTRAMUSCULAR | Status: AC
  Filled 2014-01-18: qty 1

## 2014-01-18 MED ORDER — 0.9 % SODIUM CHLORIDE (POUR BTL) OPTIME
TOPICAL | Status: DC | PRN
  Administered 2014-01-18: 1000 mL

## 2014-01-18 MED ORDER — ROCURONIUM BROMIDE 50 MG/5ML IV SOLN
INTRAVENOUS | Status: AC
  Filled 2014-01-18: qty 1

## 2014-01-18 MED ORDER — CEFAZOLIN SODIUM-DEXTROSE 2-3 GM-% IV SOLR
2.0000 g | INTRAVENOUS | Status: AC
  Administered 2014-01-18: 2 g via INTRAVENOUS

## 2014-01-18 MED ORDER — NEOSTIGMINE METHYLSULFATE 1 MG/ML IJ SOLN
INTRAMUSCULAR | Status: DC | PRN
  Administered 2014-01-18: 4 mg via INTRAVENOUS

## 2014-01-18 MED ORDER — DOCUSATE SODIUM 100 MG PO CAPS
100.0000 mg | ORAL_CAPSULE | Freq: Two times a day (BID) | ORAL | Status: DC
  Administered 2014-01-18 – 2014-01-19 (×3): 100 mg via ORAL
  Filled 2014-01-18 (×3): qty 1

## 2014-01-18 MED ORDER — NEOSTIGMINE METHYLSULFATE 1 MG/ML IJ SOLN
INTRAMUSCULAR | Status: AC
  Filled 2014-01-18: qty 1

## 2014-01-18 MED ORDER — DIPHENHYDRAMINE HCL 50 MG/ML IJ SOLN: 12.5000 mg | Freq: Four times a day (QID) | INTRAMUSCULAR | Status: DC | PRN

## 2014-01-18 MED ORDER — KCL IN DEXTROSE-NACL 20-5-0.45 MEQ/L-%-% IV SOLN
INTRAVENOUS | Status: DC
  Administered 2014-01-18 (×2): via INTRAVENOUS

## 2014-01-18 MED ORDER — KETOROLAC TROMETHAMINE 15 MG/ML IJ SOLN
30.0000 mg | Freq: Four times a day (QID) | INTRAMUSCULAR | Status: DC
  Filled 2014-01-18: qty 2

## 2014-01-18 MED ORDER — SUFENTANIL CITRATE 50 MCG/ML IV SOLN
INTRAVENOUS | Status: DC | PRN
  Administered 2014-01-18 (×4): 10 ug via INTRAVENOUS

## 2014-01-18 MED ORDER — STERILE WATER FOR IRRIGATION IR SOLN
Status: DC | PRN
  Administered 2014-01-18: 1000 mL

## 2014-01-18 MED ORDER — KETOROLAC TROMETHAMINE 15 MG/ML IJ SOLN
30.0000 mg | Freq: Four times a day (QID) | INTRAMUSCULAR | Status: DC
Start: 2014-01-18 — End: 2014-01-19
  Administered 2014-01-18 – 2014-01-19 (×3): 30 mg via INTRAVENOUS
  Filled 2014-01-18 (×3): qty 2

## 2014-01-18 MED ORDER — GLYCOPYRROLATE 0.2 MG/ML IJ SOLN
INTRAMUSCULAR | Status: AC
  Filled 2014-01-18: qty 3

## 2014-01-18 MED ORDER — BUPIVACAINE-EPINEPHRINE PF 0.5-1:200000 % IJ SOLN
INTRAMUSCULAR | Status: AC
  Filled 2014-01-18: qty 10

## 2014-01-18 SURGICAL SUPPLY — 64 items
APPLIER CLIP 13 LRG OPEN (CLIP)
BAG HAMPER (MISCELLANEOUS) ×3 IMPLANT
CELLS DAT CNTRL 66122 CELL SVR (MISCELLANEOUS) IMPLANT
CLIP APPLIE 13 LRG OPEN (CLIP) IMPLANT
CLOTH BEACON ORANGE TIMEOUT ST (SAFETY) ×3 IMPLANT
COVER LIGHT HANDLE STERIS (MISCELLANEOUS) ×6 IMPLANT
COVER MAYO STAND XLG (DRAPE) ×3 IMPLANT
DECANTER SPIKE VIAL GLASS SM (MISCELLANEOUS) ×3 IMPLANT
DRAPE PROXIMA HALF (DRAPES) ×3 IMPLANT
DRAPE STERI URO 9X17 APER PCH (DRAPES) ×3 IMPLANT
DRAPE WARM FLUID 44X44 (DRAPE) IMPLANT
DRESSING TELFA 8X3 (GAUZE/BANDAGES/DRESSINGS) IMPLANT
ELECT REM PT RETURN 9FT ADLT (ELECTROSURGICAL) ×3
ELECTRODE REM PT RTRN 9FT ADLT (ELECTROSURGICAL) ×2 IMPLANT
FORMALIN 10 PREFIL 480ML (MISCELLANEOUS) ×3 IMPLANT
GAUZE PACKING 2X5 YD STERILE (GAUZE/BANDAGES/DRESSINGS) ×3 IMPLANT
GLOVE BIOGEL PI IND STRL 7.0 (GLOVE) ×4 IMPLANT
GLOVE BIOGEL PI IND STRL 8.5 (GLOVE) ×2 IMPLANT
GLOVE BIOGEL PI IND STRL 9 (GLOVE) ×2 IMPLANT
GLOVE BIOGEL PI INDICATOR 7.0 (GLOVE) ×2
GLOVE BIOGEL PI INDICATOR 8.5 (GLOVE) ×1
GLOVE BIOGEL PI INDICATOR 9 (GLOVE) ×1
GLOVE ECLIPSE 6.5 STRL STRAW (GLOVE) ×3 IMPLANT
GLOVE ECLIPSE 8.0 STRL XLNG CF (GLOVE) ×3 IMPLANT
GLOVE ECLIPSE 9.0 STRL (GLOVE) ×3 IMPLANT
GLOVE EXAM NITRILE MD LF STRL (GLOVE) ×3 IMPLANT
GLOVE INDICATOR STER SZ 9 (GLOVE) ×3 IMPLANT
GLOVE SS BIOGEL STRL SZ 6.5 (GLOVE) ×2 IMPLANT
GLOVE SUPERSENSE BIOGEL SZ 6.5 (GLOVE) ×1
GOWN SPEC L3 XXLG W/TWL (GOWN DISPOSABLE) ×3 IMPLANT
GOWN STRL REUS W/TWL LRG LVL3 (GOWN DISPOSABLE) ×6 IMPLANT
INST SET MAJOR GENERAL (KITS) IMPLANT
IV NS 1000ML (IV SOLUTION) ×1
IV NS 1000ML BAXH (IV SOLUTION) ×2 IMPLANT
IV NS IRRIG 3000ML ARTHROMATIC (IV SOLUTION) IMPLANT
KIT ROOM TURNOVER AP CYSTO (KITS) ×3 IMPLANT
KIT ROOM TURNOVER APOR (KITS) ×3 IMPLANT
MANIFOLD NEPTUNE II (INSTRUMENTS) ×3 IMPLANT
NEEDLE HYPO 25X1 1.5 SAFETY (NEEDLE) IMPLANT
NS IRRIG 1000ML POUR BTL (IV SOLUTION) ×6 IMPLANT
PACK ABDOMINAL MAJOR (CUSTOM PROCEDURE TRAY) IMPLANT
PACK PERI GYN (CUSTOM PROCEDURE TRAY) ×3 IMPLANT
PAD ARMBOARD 7.5X6 YLW CONV (MISCELLANEOUS) ×3 IMPLANT
RETRACTOR WND ALEXIS 25 LRG (MISCELLANEOUS) IMPLANT
RTRCTR WOUND ALEXIS 18CM MED (MISCELLANEOUS)
RTRCTR WOUND ALEXIS 25CM LRG (MISCELLANEOUS)
SET BASIN LINEN APH (SET/KITS/TRAYS/PACK) ×3 IMPLANT
STAPLER VISISTAT 35W (STAPLE) IMPLANT
SUT CHROMIC 0 CT 1 (SUTURE) ×36 IMPLANT
SUT CHROMIC 2 0 CT 1 (SUTURE) ×3 IMPLANT
SUT CHROMIC GUT BROWN 0 54 (SUTURE) IMPLANT
SUT CHROMIC GUT BROWN 0 54IN (SUTURE)
SUT MON AB 3-0 SH 27 (SUTURE) IMPLANT
SUT PROLENE 2 0 SH 30 (SUTURE) ×3 IMPLANT
SUT VIC AB 0 CT1 27 (SUTURE)
SUT VIC AB 0 CT1 27XBRD ANTBC (SUTURE) IMPLANT
SUT VIC AB 0 CT2 8-18 (SUTURE) ×3 IMPLANT
SUT VIC AB 0 CTX 36 (SUTURE)
SUT VIC AB 0 CTX36XBRD ANTBCTR (SUTURE) IMPLANT
SUT VICRYL 3 0 (SUTURE) IMPLANT
SYR CONTROL 10ML LL (SYRINGE) ×3 IMPLANT
TRAY FOLEY CATH 16FR SILVER (SET/KITS/TRAYS/PACK) ×3 IMPLANT
VERSALIGHT (MISCELLANEOUS) ×3 IMPLANT
WATER STERILE IRR 1000ML POUR (IV SOLUTION) ×3 IMPLANT

## 2014-01-18 NOTE — Anesthesia Preprocedure Evaluation (Signed)
Anesthesia Evaluation  Patient identified by MRN, date of birth, ID band Patient awake    Reviewed: Allergy & Precautions, H&P , NPO status , Patient's Chart, lab work & pertinent test results  History of Anesthesia Complications (+) PONV and history of anesthetic complications  Airway Mallampati: II      Dental  (+) Edentulous Upper and Edentulous Lower   Pulmonary former smoker,    Pulmonary exam normal       Cardiovascular negative cardio ROS  Rhythm:Regular Rate:Normal     Neuro/Psych PSYCHIATRIC DISORDERS Depression    GI/Hepatic GERD-  Medicated,  Endo/Other    Renal/GU      Musculoskeletal   Abdominal   Peds  Hematology   Anesthesia Other Findings   Reproductive/Obstetrics                           Anesthesia Physical Anesthesia Plan  ASA: II  Anesthesia Plan: General   Post-op Pain Management:    Induction: Intravenous, Rapid sequence and Cricoid pressure planned  Airway Management Planned: Oral ETT  Additional Equipment:   Intra-op Plan:   Post-operative Plan: Extubation in OR  Informed Consent: I have reviewed the patients History and Physical, chart, labs and discussed the procedure including the risks, benefits and alternatives for the proposed anesthesia with the patient or authorized representative who has indicated his/her understanding and acceptance.     Plan Discussed with:   Anesthesia Plan Comments:         Anesthesia Quick Evaluation

## 2014-01-18 NOTE — Anesthesia Procedure Notes (Signed)
Procedure Name: Intubation Date/Time: 01/18/2014 7:39 AM Performed by: Andree Elk, AMY A Pre-anesthesia Checklist: Patient identified, Patient being monitored, Timeout performed, Emergency Drugs available and Suction available Patient Re-evaluated:Patient Re-evaluated prior to inductionOxygen Delivery Method: Circle System Utilized Preoxygenation: Pre-oxygenation with 100% oxygen Intubation Type: IV induction, Rapid sequence and Cricoid Pressure applied Laryngoscope Size: 3 and Miller Grade View: Grade I Tube type: Oral Tube size: 7.0 mm Number of attempts: 1 Airway Equipment and Method: stylet Placement Confirmation: ETT inserted through vocal cords under direct vision,  positive ETCO2 and breath sounds checked- equal and bilateral Secured at: 21 cm Tube secured with: Tape Dental Injury: Teeth and Oropharynx as per pre-operative assessment

## 2014-01-18 NOTE — Brief Op Note (Signed)
01/18/2014  10:17 AM  PATIENT:  Patricia [name]  68 y.o. female  PRE-OPERATIVE DIAGNOSIS:  Rectocele Prolapse, Uterovaginal Prolapse, Incomplete,   POST-OPERATIVE DIAGNOSIS:  Rectocele Prolapse, Uterovaginal Prolapse, Incomplete,   PROCEDURE:  Procedure(s): HYSTERECTOMY VAGINAL (N/A) POSTERIOR REPAIR (RECTOCELE) (N/A)  SURGEON:  Surgeon(s) and Role:    * Jonnie Kind, MD - Primary  PHYSICIAN ASSISTANT:   ASSISTANTS: Dallas RN, FA; Witt CST   ANESTHESIA:   local and general  EBL:  Total I/O In: 2400 [I.V.:2400] Out: 650 [Urine:550; Blood:100]  BLOOD ADMINISTERED:none  DRAINS: Urinary Catheter (Foley) vaginal packing Betadine soaked gauze  LOCAL MEDICATIONS USED:  MARCAINE    and Amount: 19 ml  SPECIMEN:  Source of Specimen:  Uterus cervix vaginal epithelium  DISPOSITION OF SPECIMEN:  PATHOLOGY  COUNTS:  YES  TOURNIQUET:  * No tourniquets in log *  DICTATION: .Dragon Dictation  PLAN OF CARE: Admit for overnight observation  PATIENT DISPOSITION:  PACU - hemodynamically stable.   Delay start of Pharmacological VTE agent (>24hrs) due to surgical blood loss or risk of bleeding: not applicable

## 2014-01-18 NOTE — Op Note (Signed)
01/18/2014  10:17 AM  PATIENT:  Patricia [name]  68 y.o. female  PRE-OPERATIVE DIAGNOSIS:  Rectocele Prolapse, Uterovaginal Prolapse, Incomplete,   POST-OPERATIVE DIAGNOSIS:  Rectocele Prolapse, Uterovaginal Prolapse, Incomplete,   PROCEDURE:  Procedure(s): HYSTERECTOMY VAGINAL (N/A) POSTERIOR REPAIR (RECTOCELE) (N/A)  SURGEON:  Surgeon(s) and Role:    * Jonnie Kind, MD - Primary   Details of procedure: Patient was taken operating room prepped and draped, and timeout conducted and procedure confirmed by surgical team. High lithotomy leg supports were used. Examination showed and a large bulging rectocele protruding to the introitus, short vaginal length, with minimal descent of this bladder. Cervix was circumscribed with Bovie cautery after infiltration with Marcaine x9 cc, and anterior vesicouterine peritoneal reflection identified and opened. Posterior colpotomy was performed. Uterosacral ligaments were clamped cut and suture ligated, tagged for future identification with 0 chromic suture use. Lower cardinal ligaments were serially clamped cut suture ligated, and then the broad ligament treated similarly. Uterus was removed. Pedicles involving the round ligament and fallopian tube were inspected drink and found hemostatic. Tested for inferior traction to see if they could be removed but that they were visually normal and did not descend well so decision was made to cancel consideration of bilateral salpingo-oophorectomy. Next Pedicles were inspected. Catheterization of bladder revealed generous clear urine 200 cc initially, then more throughout the case. On the uterosacral ligaments were used with 2-0 Prolene to reduce the cul-de-sac defect, suturing with 2-0 Prolene from the midline to the ipsilateral uterosacral ligament to improve vaginal cuff support. These were left loose initially, then the anterior peritoneum, the bladder flap was then tacked down to the posterior cul-de-sac and the  2-0 Prolene sutures tied down resulting in improved vaginal cuff support and reduction of the potential for future enterocele. The epiploic fat which had been quite prominent was elevated significantly by this process. Inspection the bladder showed support was sufficient that no anterior repair was necessary. The cuff was then closed with interrupted 2-0 chromic suture. Posterior repair. This involved infiltrating beneath the posterior vaginal epithelium with Marcaine x10 cc, and putting the vaginal epithelium from the hymen remnant cephalad for a distance of about 4 cm dissecting the epithelium off the underlying connective tissue. There was a lot of fatty tissue and anterior herniation of the bowel. Digital rectal exam with a double blunt finger let us to identify the margins of the  rectum. Allis clamps were used to grasp lateral pararectal tissues which could be pulled to the midline and sutured with mattress sutures of 0 Vicryl to rebuild the perineal body with a significant improvement in the rectal support anteriorly. Perineal body was rebuilt sufficiently the epithelium was then trimmed and reapproximated with interrupted 2-0 chromic. A repeat rectal exam showed dramatically improved anterior support vaginal exam with a fresh gloves showed some shortening of the vagina, some of which had been present prior to surgery. Sponge and needle counts correct patient to recovery room after vaginal packing inserted in the vagina to tell urine output during the case was 550 cc of clear urine.

## 2014-01-18 NOTE — Transfer of Care (Signed)
Immediate Anesthesia Transfer of Care Note  Patient: Patricia [name]  Procedure(s) Performed: Procedure(s): HYSTERECTOMY VAGINAL (N/A) POSTERIOR REPAIR (RECTOCELE) (N/A)  Patient Location: PACU  Anesthesia Type:General  Level of Consciousness: awake, alert , oriented and patient cooperative  Airway & Oxygen Therapy: Patient Spontanous Breathing and Patient connected to face mask oxygen  Post-op Assessment: Report given to PACU RN and Post -op Vital signs reviewed and stable  Post vital signs: Reviewed and stable  Complications: No apparent anesthesia complications

## 2014-01-18 NOTE — H&P (View-Only) (Signed)
Patricia [name] is an 68 y.o. female. She is scheduled for vaginal hysterectomy and  Posterior repair for   rectocele grade 3 and enterocele. Pt states that she feels "dirty" after having BMs due to having some leakage. Reviewed options including back wall repair and hysterectomy. Advised pt that an endometrial biopsy will be needed to check after US findings of increased endometrial lining. Pt agreed to procedure, and results were benign She denies SUI, sexual activity  H/o left salpingectomy from ectopic pregnancy     Pertinent Gynecological History: Menses: post-menopausal Bleeding: none Contraception: post menopausal status DES exposure: unknown Blood transfusions: none Sexually transmitted diseases: no past history Previous GYN Procedures:   Last mammogram: normal Date: 05/11/13 Last pap: normal Date: 08/25/13 OB History: G, P   Menstrual History: Menarche age:  No LMP recorded. Patient is postmenopausal.    Past Medical History  Diagnosis Date  . PONV (postoperative nausea and vomiting)   . Hypercholesterolemia   . GERD (gastroesophageal reflux disease)   . Depression   . IFG (impaired fasting glucose)   . Osteopenia   . Spinal headache     Past Surgical History  Procedure Laterality Date  . Ectopic pregnancy surgery      removal of right tube  . Ovarian cyst removal      right  . Cataract extraction w/phaco  09/12/2011    Procedure: CATARACT EXTRACTION PHACO AND INTRAOCULAR LENS PLACEMENT (IOC);  Surgeon: Tonny Branch;  Location: [facility];  Service: Ophthalmology;  Laterality: Left;  CDE: 8.91  . Cataract extraction w/phaco  11/18/2011    Procedure: CATARACT EXTRACTION PHACO AND INTRAOCULAR LENS PLACEMENT (IOC);  Surgeon: Tonny Branch;  Location: [facility];  Service: Ophthalmology;  Laterality: Right;  CDE:10.26  . Colonoscopy      Family History  Problem Relation Age of Onset  . Anesthesia problems Neg Hx   . Hypotension Neg Hx   . Malignant hyperthermia Neg Hx   .  Pseudochol deficiency Neg Hx   . Heart attack Father   . Osteoporosis Mother   . Heart attack Brother 29    MI  . Breast cancer Maternal Aunt   . Breast cancer Paternal Aunt   . Cancer Paternal Aunt     ovarian cancer    Social History:  reports that she quit smoking about 29 years ago. Her smoking use included Cigarettes. She has a 20 pack-year smoking history. She has never used smokeless tobacco. She reports that she does not drink alcohol or use illicit drugs.  Allergies:  Allergies  Allergen Reactions  . Codeine Nausea Only     (Not in a hospital admission)  ROS - for SUI + for anal incontinence after defecation  There were no vitals taken for this visit. Physical Exam  Constitutional: She is oriented to person, place, and time.  HENT:  Head: Atraumatic.  Eyes: Pupils are equal, round, and reactive to light.  Cardiovascular: Normal rate and regular rhythm.   Respiratory: Effort normal.  GI: Soft. Bowel sounds are normal.  Genitourinary: Vagina normal.  Uterine prolapse second degree  Neurological: She is alert and oriented to person, place, and time.  Psychiatric: She has a normal mood and affect. Her behavior is normal.  rectocele present to introitus  No results found for this or any previous visit (from the past 24 hour(s)).  No results found.  Assessment/Plan: Rectocele, second degree uterine descensus, for vag hysterectomy and anterior and posterior repair.  Patricia [name] V 01/17/2014, 5:47 PM

## 2014-01-18 NOTE — Anesthesia Postprocedure Evaluation (Signed)
  Anesthesia Post-op Note  Patient: Patricia [name]  Procedure(s) Performed: Procedure(s): HYSTERECTOMY VAGINAL (N/A) POSTERIOR REPAIR (RECTOCELE) (N/A)  Patient Location: PACU  Anesthesia Type:General  Level of Consciousness: awake, alert , oriented and patient cooperative  Airway and Oxygen Therapy: Patient Spontanous Breathing and Patient connected to face mask oxygen  Post-op Pain: none  Post-op Assessment: Post-op Vital signs reviewed, Patient's Cardiovascular Status Stable, Respiratory Function Stable, Patent Airway, No signs of Nausea or vomiting and Pain level controlled  Post-op Vital Signs: Reviewed and stable  Complications: No apparent anesthesia complications

## 2014-01-18 NOTE — Interval H&P Note (Signed)
History and Physical Interval Note:  01/18/2014 7:23 AM  Ronna B Greaser  has presented today for surgery, with the diagnosis of uterine prolapse cystocele rectocele  The various methods of treatment have been discussed with the patient and family. After consideration of risks, benefits and other options for treatment, the patient has consented to  Procedure(s): HYSTERECTOMY VAGINAL (N/A) SALPINGO OOPHORECTOMY (Bilateral) ANTERIOR (CYSTOCELE) AND POSTERIOR REPAIR (RECTOCELE) (N/A), with BILATERAL SALPINGOOPHORECTOMY as a surgical intervention . The patient is aware that at times the ovaries and tubes are not sufficiently accessible vaginally to be removed, and in such cases are left in place. The patient's history has been reviewed, patient examined, no change in status, stable for surgery.  I have reviewed the patient's chart and labs.  Questions were answered to the patient's satisfaction.  ECG reviewed, normal sinus rhythm.   Jonnie Kind

## 2014-01-18 NOTE — Preoperative (Signed)
Beta Blockers   Reason not to administer Beta Blockers:Not Applicable 

## 2014-01-19 LAB — CBC
HEMATOCRIT: 36.5 % (ref 36.0–46.0)
HEMOGLOBIN: 12.1 g/dL (ref 12.0–15.0)
MCH: 30.3 pg (ref 26.0–34.0)
MCHC: 33.2 g/dL (ref 30.0–36.0)
MCV: 91.5 fL (ref 78.0–100.0)
Platelets: 202 10*3/uL (ref 150–400)
RBC: 3.99 MIL/uL (ref 3.87–5.11)
RDW: 13 % (ref 11.5–15.5)
WBC: 11.8 10*3/uL — ABNORMAL HIGH (ref 4.0–10.5)

## 2014-01-19 LAB — BASIC METABOLIC PANEL
BUN: 11 mg/dL (ref 6–23)
CALCIUM: 8.4 mg/dL (ref 8.4–10.5)
CO2: 29 meq/L (ref 19–32)
CREATININE: 0.62 mg/dL (ref 0.50–1.10)
Chloride: 106 mEq/L (ref 96–112)
GFR calc Af Amer: >90 mL/min (ref >90)
GFR calc non Af Amer: >90 mL/min (ref >90)
GLUCOSE: 116 mg/dL — AB (ref 70–99)
Potassium: 4.2 mEq/L (ref 3.7–5.3)
SODIUM: 141 meq/L (ref 137–147)

## 2014-01-19 MED ORDER — POLYETHYLENE GLYCOL 3350 17 GM/SCOOP PO POWD: 17.0000 g | Freq: Every day | ORAL | Status: DC

## 2014-01-19 MED ORDER — HYDROCODONE-ACETAMINOPHEN 5-325 MG PO TABS: 1.0000 | ORAL_TABLET | Freq: Four times a day (QID) | ORAL | Status: DC | PRN

## 2014-01-19 NOTE — Progress Notes (Signed)
1 Day Post-Op Procedure(s) (LRB): HYSTERECTOMY VAGINAL (N/A) POSTERIOR REPAIR (RECTOCELE) (N/A)  Subjective: Patient reports tolerating PO, + flatus and no problems voiding.    Objective: I have reviewed patient's vital signs, intake and output and labs. BP 95/53  Pulse 51  Temp(Src) 98.2 F (36.8 C) (Oral)  Resp 16  Ht 4\' 11"  (1.499 m)  Wt 135 lb (61.236 kg)  BMI 27.25 kg/m2  SpO2 99% CBC    Component Value Date/Time   WBC 11.8* 01/19/2014 0530   RBC 3.99 01/19/2014 0530   HGB 12.1 01/19/2014 0530   HCT 36.5 01/19/2014 0530   PLT 202 01/19/2014 0530   MCV 91.5 01/19/2014 0530   MCH 30.3 01/19/2014 0530   MCHC 33.2 01/19/2014 0530   RDW 13.0 01/19/2014 0530   BMET    Component Value Date/Time   NA 141 01/19/2014 0530   K 4.2 01/19/2014 0530   CL 106 01/19/2014 0530   CO2 29 01/19/2014 0530   GLUCOSE 116* 01/19/2014 0530   BUN 11 01/19/2014 0530   CREATININE 0.62 01/19/2014 0530   CALCIUM 8.4 01/19/2014 0530   GFRNONAA >90 01/19/2014 0530   GFRAA >90 01/19/2014 0530     Intake/Output Summary (Last 24 hours) at 01/19/14 0905 Last data filed at 01/18/14 2233  Gross per 24 hour  Intake   1400 ml  Output   1400 ml  Net      0 ml   General: alert, cooperative and no distress GI: soft, non-tender; bowel sounds normal; no masses,  no organomegaly Vaginal Bleeding: minimal vag pack d/c'd.  Assessment: s/p Procedure(s): HYSTERECTOMY VAGINAL (N/A) POSTERIOR REPAIR (RECTOCELE) (N/A): stable and progressing well  Plan: Advance diet Encourage ambulation Discharge home  LOS: 1 day    Azaylia Fong V 01/19/2014, 9:04 AM

## 2014-01-19 NOTE — Discharge Instructions (Signed)
Hysterectomy Information A hysterectomy is a surgery to remove your uterus. After surgery, you will no longer have periods. Also, you will not be able to get pregnant.  REASONS FOR THIS SURGERY  You have bleeding that is not normal and keeps coming back.  You have lasting (chronic) lower belly (pelvic) pain.  You have a lasting infection.  The lining of your uterus grows outside your uterus.  The lining of your uterus grows in the muscle of your uterus.  Your uterus falls down into your vagina.  You have a growth in your uterus that causes problems.  You have cells that could turn into cancer (precancerous cells).  You have cancer of the uterus or cervix. TYPES  There are 3 types of hysterectomies. Depending on the type, the surgery will:  Remove the top part of the uterus only.  Remove the uterus and the cervix.  Remove the uterus, cervix, and tissue that holds the uterus in place in the lower belly. WAYS A HYSTERECTOMY CAN BE PERFORMED There are 5 ways this surgery can be performed.   A cut (incision) is made in the belly (abdomen). The uterus is taken out through the cut.  A cut is made in the vagina. The uterus is taken out through the cut.  Three or four cuts are made in the belly. A surgical device with a camera is put through one of the cuts. The uterus is cut into small pieces. The uterus is taken out through the cuts or the vagina.  Three or four cuts are made in the belly. A surgical device with a camera is put through one of the cuts. The uterus is taken out through the vagina.  Three or four cuts are made in the belly. A surgical device that is controlled by a computer makes a visual image. The device helps the surgeon control the surgical tools. The uterus is cut into small pieces. The pieces are taken out through the cuts or through the vagina. WHAT TO EXPECT AFTER THE SURGERY  You will be given pain medicine.  You will need help at home for 3 5 days after  surgery.  You will need to see your doctor in 2 4 weeks after surgery.  You may get hot flashes, have night sweats, and have trouble sleeping.  You may need to have Pap tests in the future if your surgery was related to cancer. Talk to your doctor. It is still good to have regular exams. Document Released: 02/17/2012 Document Revised: 09/15/2013 Document Reviewed: 08/02/2013 [facility] Patient Information 2014 [place].

## 2014-01-19 NOTE — Progress Notes (Signed)
IV removed. Discharge instructions reviewed with patient. Understanding verbalized. Ready for discharge home

## 2014-01-19 NOTE — Anesthesia Postprocedure Evaluation (Signed)
  Anesthesia Post-op Note  Patient: Patricia [name]  Procedure(s) Performed: Procedure(s): HYSTERECTOMY VAGINAL (N/A) POSTERIOR REPAIR (RECTOCELE) (N/A)  Patient Location: PACU  Anesthesia Type:General  Level of Consciousness: awake, alert , oriented and patient cooperative  Airway and Oxygen Therapy: Patient Spontanous Breathing  Post-op Pain: 1 /10, mild  Post-op Assessment: Post-op Vital signs reviewed, Patient's Cardiovascular Status Stable, Respiratory Function Stable, Patent Airway, No signs of Nausea or vomiting and Pain level controlled  Post-op Vital Signs: Reviewed and stable  Complications: No apparent anesthesia complications

## 2014-01-19 NOTE — Addendum Note (Signed)
Addendum created 01/19/14 1225 by Charmaine Downs, CRNA   Modules edited: Notes Section   Notes Section:  File: [id number]

## 2014-01-19 NOTE — Discharge Summary (Signed)
  Jonnie Kind, MD Physician Signed  H&P Patricia [name]) Service date: 01/17/2014 5:47 PM   Patricia [name] is an 68 y.o. female. She is scheduled for vaginal hysterectomy and Posterior repair for  rectocele grade 3 and enterocele. Pt states that she feels "dirty" after having BMs due to having some leakage. Reviewed options including back wall repair and hysterectomy. Advised pt that an endometrial biopsy will be needed to check after US findings of increased endometrial lining. Pt agreed to procedure, and results were benign  She denies SUI, sexual activity  H/o left salpingectomy from ectopic pregnancy        1 Day Post-Op Procedure(s) (LRB): HYSTERECTOMY VAGINAL (N/A) POSTERIOR REPAIR (RECTOCELE) (N/A)  Subjective: Patient reports tolerating PO, + flatus and no problems voiding.    Objective: I have reviewed patient's vital signs, intake and output and labs. BP 95/53  Pulse 51  Temp(Src) 98.2 F (36.8 C) (Oral)  Resp 16  Ht 4\' 11"  (1.499 m)  Wt 135 lb (61.236 kg)  BMI 27.25 kg/m2  SpO2 99% CBC    Component Value Date/Time   WBC 11.8* 01/19/2014 0530   RBC 3.99 01/19/2014 0530   HGB 12.1 01/19/2014 0530   HCT 36.5 01/19/2014 0530   PLT 202 01/19/2014 0530   MCV 91.5 01/19/2014 0530   MCH 30.3 01/19/2014 0530   MCHC 33.2 01/19/2014 0530   RDW 13.0 01/19/2014 0530   BMET    Component Value Date/Time   NA 141 01/19/2014 0530   K 4.2 01/19/2014 0530   CL 106 01/19/2014 0530   CO2 29 01/19/2014 0530   GLUCOSE 116* 01/19/2014 0530   BUN 11 01/19/2014 0530   CREATININE 0.62 01/19/2014 0530   CALCIUM 8.4 01/19/2014 0530   GFRNONAA >90 01/19/2014 0530   GFRAA >90 01/19/2014 0530     Intake/Output Summary (Last 24 hours) at 01/19/14 0905 Last data filed at 01/18/14 2233  Gross per 24 hour  Intake   1400 ml  Output   1400 ml  Net      0 ml   General: alert, cooperative and no distress GI: soft, non-tender; bowel sounds normal; no masses,  no organomegaly Vaginal Bleeding:  minimal vag pack d/c'd.  Assessment: s/p Procedure(s): HYSTERECTOMY VAGINAL (N/A) POSTERIOR REPAIR (RECTOCELE) (N/A): stable and progressing well  Plan: Advance diet Encourage ambulation Discharge home  LOS: 1 day

## 2014-01-20 ENCOUNTER — Encounter (HOSPITAL_COMMUNITY): Payer: Self-pay | Admitting: Obstetrics and Gynecology

## 2014-01-25 ENCOUNTER — Encounter: Payer: BC Managed Care – PPO | Admitting: Obstetrics and Gynecology

## 2014-02-01 ENCOUNTER — Encounter: Payer: BC Managed Care – PPO | Admitting: Obstetrics and Gynecology

## 2014-02-07 ENCOUNTER — Encounter: Payer: Self-pay | Admitting: Obstetrics and Gynecology

## 2014-02-07 ENCOUNTER — Ambulatory Visit (INDEPENDENT_AMBULATORY_CARE_PROVIDER_SITE_OTHER): Payer: Self-pay | Admitting: Obstetrics and Gynecology

## 2014-02-07 VITALS — BP 142/80 | Ht 59.0 in | Wt 135.2 lb

## 2014-02-07 DIAGNOSIS — Z9889 Other specified postprocedural states: Secondary | ICD-10-CM

## 2014-02-07 NOTE — Progress Notes (Signed)
This chart was scribed by Ludger Nutting, Medical Scribe, for Dr. Mallory Shirk on 02/07/14 at 2:12 PM. This chart was reviewed by Dr. Mallory Shirk and is accurate.    Subjective:  Patricia [name] is a 68 y.o. female who presents to the clinic 3 weeks status post vaginal hysterectomy and posterior colporrhaphy.    Review of Systems Negative except noted above  She has been eating a regular diet without difficulty.   Bowel movements are normal. Pain is controlled without any medications.  Objective:  BP 142/80  Ht 4\' 11"  (1.499 m)  Wt 135 lb 3.2 oz (61.326 kg)  BMI 27.29 kg/m2 General:Well developed, well nourished.  No acute distress. Abdomen: Bowel sounds normal, soft, non-tender. Pelvic Exam:    External Genitalia:  Normal.    Vagina: Well appearing but healing slowly vaginal cuff, good posterior support, short vaginal length of 7 cm.     Bimanual: Normal    Cervix: Surgically absent    Uterus: Surgically absent    Adnexa: Normal  Incision(s):   Healing well, no drainage, no erythema, no hernia, no swelling, no dehiscence, incision well approximated.   Assessment:  Post-Op 3 weeks s/p vaginal hysterectomy and posterior colporrhaphy    Doing well postoperatively.   Plan:  1.Wound care discussed   2. .Continue any current medications. 3. Activity restrictions: avoid heavy lifting, sexual activity, coughing, or straining 4. return to work: after 3 more weeks. 5. Follow up in 3 weeks.

## 2014-02-09 ENCOUNTER — Other Ambulatory Visit: Payer: Self-pay | Admitting: Family Medicine

## 2014-02-28 ENCOUNTER — Encounter: Payer: Self-pay | Admitting: Obstetrics and Gynecology

## 2014-02-28 ENCOUNTER — Ambulatory Visit (INDEPENDENT_AMBULATORY_CARE_PROVIDER_SITE_OTHER): Payer: Self-pay | Admitting: Obstetrics and Gynecology

## 2014-02-28 VITALS — BP 140/82 | Ht 59.0 in | Wt 134.4 lb

## 2014-02-28 DIAGNOSIS — Z9889 Other specified postprocedural states: Secondary | ICD-10-CM

## 2014-02-28 NOTE — Progress Notes (Signed)
Patient ID: CHERINA DHILLON, female   DOB: 23-Mar-1946, 68 y.o.   MRN: [id number] Subjective:      NELLIE PESTER is a 68 y.o. female who presents to the clinic 6 weeks status post vaginal hysterectomy, anterior colporrhaphy and posterior colporrhaphy for fibroids. Diet:       regular without difficulty. Bowel function is: improved. Pain:     The patient is not having any pain.  The following portions of the patient's history were reviewed and updated as appropriate: allergies, current medications, past medical history, past social history and problem list.  Review of Systems Ears, nose, mouth, throat, and face: negative    Objective:    BP 140/82  Ht 4\' 11"  (1.499 m)  Wt 60.963 kg (134 lb 6.4 oz)  BMI 27.13 kg/m2 General:  alert, cooperative, appears stated age, combative and no distress  Abdomen: soft, bowel sounds active, non-tender  Incision:   healing well, no drainage, no erythema, no hernia, no seroma, no dehiscence, incision well approximated       Pelvic: short  Vag length, adequate support    Assessment:    Doing well postoperatively. Operative findings again reviewed. Pathology report discussed.    Plan:    1. Continue any current medications. 2. Wound care discussed. 3. Activity restrictions: no bending, stooping, or squatting and no lifting more than 15 pounds 4. Anticipated return to work: 1-2 weeks. 5. Follow up: a few prn for  Exam based on sx.Marland Kitchen

## 2014-02-28 NOTE — Patient Instructions (Signed)
Thank you for letting us care for your surgery

## 2014-03-18 ENCOUNTER — Other Ambulatory Visit: Payer: Self-pay | Admitting: Nurse Practitioner

## 2014-03-30 ENCOUNTER — Telehealth: Payer: Self-pay | Admitting: Obstetrics and Gynecology

## 2014-03-30 NOTE — Telephone Encounter (Signed)
Pt informed forms faxed to [place] at [facility].

## 2014-04-08 ENCOUNTER — Ambulatory Visit (INDEPENDENT_AMBULATORY_CARE_PROVIDER_SITE_OTHER): Payer: BC Managed Care – PPO | Admitting: Nurse Practitioner

## 2014-04-08 ENCOUNTER — Encounter: Payer: Self-pay | Admitting: Nurse Practitioner

## 2014-04-08 VITALS — BP 110/68 | Ht 59.0 in | Wt 132.0 lb

## 2014-04-08 DIAGNOSIS — E785 Hyperlipidemia, unspecified: Secondary | ICD-10-CM

## 2014-04-08 DIAGNOSIS — K219 Gastro-esophageal reflux disease without esophagitis: Secondary | ICD-10-CM

## 2014-04-08 DIAGNOSIS — G4700 Insomnia, unspecified: Secondary | ICD-10-CM

## 2014-04-08 MED ORDER — PRAVASTATIN SODIUM 40 MG PO TABS: ORAL_TABLET | ORAL | Status: DC

## 2014-04-08 MED ORDER — ALPRAZOLAM 0.5 MG PO TABS: 0.5000 mg | ORAL_TABLET | Freq: Every evening | ORAL | Status: DC | PRN

## 2014-04-08 MED ORDER — OMEPRAZOLE 20 MG PO CPDR: DELAYED_RELEASE_CAPSULE | ORAL | Status: DC

## 2014-04-11 ENCOUNTER — Encounter: Payer: Self-pay | Admitting: Nurse Practitioner

## 2014-04-11 DIAGNOSIS — G4700 Insomnia, unspecified: Secondary | ICD-10-CM | POA: Insufficient documentation

## 2014-04-11 DIAGNOSIS — K219 Gastro-esophageal reflux disease without esophagitis: Secondary | ICD-10-CM | POA: Insufficient documentation

## 2014-04-11 NOTE — Progress Notes (Signed)
Subjective:  Presents for routine followup. No chest pain/ischemic type pain or shortness of breath. Reflux is stable on omeprazole. Taking Xanax only at bedtime for sleep. Staying very active. Overall healthy diet.  Objective:   BP 110/68  Ht 4\' 11"  (1.499 m)  Wt 132 lb (59.875 kg)  BMI 26.65 kg/m2 NAD. Alert, oriented. Lungs clear. Heart regular rate rhythm. Lower extremities no edema. Abdomen soft nondistended nontender.  Assessment: Problem List Items Addressed This Visit     Digestive   GERD (gastroesophageal reflux disease)   Relevant Medications      omeprazole (PRILOSEC) capsule     Other   Hyperlipemia - Primary (Chronic)   Relevant Medications      pravastatin (PRAVACHOL) tablet   Insomnia      Plan: Meds ordered this encounter  Medications  . pravastatin (PRAVACHOL) 40 MG tablet    Sig: TAKE 1 TABLET AT BEDTIME    Dispense:  90 tablet    Refill:  1    Order Specific Question:  Supervising Provider    Answer:  Mikey Kirschner [[id number]]  . omeprazole (PRILOSEC) 20 MG capsule    Sig: TAKE 1 CAPSULE TWICE DAILY    Dispense:  180 capsule    Refill:  1    Order Specific Question:  Supervising Provider    Answer:  Mikey Kirschner [[id number]]  . ALPRAZolam (XANAX) 0.5 MG tablet    Sig: Take 1 tablet (0.5 mg total) by mouth at bedtime as needed for sleep.    Dispense:  30 tablet    Refill:  2    Order Specific Question:  Supervising Provider    Answer:  Mikey Kirschner [[id number]]   Hold on Fosamax based on last bone density results. Had lab work done recently through work. To bring a copy at her next visit. Followup in September at her wellness physical.

## 2014-05-01 ENCOUNTER — Other Ambulatory Visit: Payer: Self-pay | Admitting: Family Medicine

## 2014-08-10 ENCOUNTER — Telehealth: Payer: Self-pay | Admitting: Family Medicine

## 2014-08-10 NOTE — Telephone Encounter (Signed)
Please review patients lab work results in red sleeve.

## 2014-08-16 NOTE — Telephone Encounter (Signed)
The patient's lab work looks very good. Has appointment in September to see Hoyle Sauer. Total cholesterol slightly elevated but her HDL is outstanding at 79. LDL 124. Creatinine good. Glucose 97. Liver enzymes normal. Hemoglobin 14.2 TSH 4.45 vitamin D 30.

## 2014-08-26 ENCOUNTER — Encounter: Payer: BC Managed Care – PPO | Admitting: Nurse Practitioner

## 2014-09-21 ENCOUNTER — Other Ambulatory Visit: Payer: Self-pay | Admitting: Nurse Practitioner

## 2014-10-03 ENCOUNTER — Telehealth: Payer: Self-pay | Admitting: *Deleted

## 2014-10-03 NOTE — Telephone Encounter (Signed)
[facility] request from [facility] for alprazolam 0.5 mg. Last seen 04/08/14

## 2014-10-04 MED ORDER — ALPRAZOLAM 0.5 MG PO TABS: 0.5000 mg | ORAL_TABLET | Freq: Every evening | ORAL | Status: DC | PRN

## 2014-10-04 NOTE — Telephone Encounter (Signed)
Rx faxed to pharmacy  

## 2014-10-04 NOTE — Telephone Encounter (Signed)
May give this +3 refills will need office visit by springtime

## 2014-10-13 ENCOUNTER — Encounter: Payer: BC Managed Care – PPO | Admitting: Nurse Practitioner

## 2014-10-14 ENCOUNTER — Other Ambulatory Visit: Payer: Self-pay | Admitting: Family Medicine

## 2014-10-31 ENCOUNTER — Encounter: Payer: BC Managed Care – PPO | Admitting: Nurse Practitioner

## 2014-11-28 ENCOUNTER — Ambulatory Visit (INDEPENDENT_AMBULATORY_CARE_PROVIDER_SITE_OTHER): Payer: BC Managed Care – PPO | Admitting: Nurse Practitioner

## 2014-11-28 ENCOUNTER — Encounter: Payer: Self-pay | Admitting: Nurse Practitioner

## 2014-11-28 VITALS — BP 124/88 | Ht 59.0 in | Wt 135.0 lb

## 2014-11-28 DIAGNOSIS — Z01419 Encounter for gynecological examination (general) (routine) without abnormal findings: Secondary | ICD-10-CM

## 2014-11-28 DIAGNOSIS — Z1231 Encounter for screening mammogram for malignant neoplasm of breast: Secondary | ICD-10-CM

## 2014-11-28 DIAGNOSIS — Z0000 Encounter for general adult medical examination without abnormal findings: Secondary | ICD-10-CM

## 2014-11-28 NOTE — Progress Notes (Signed)
   Subjective:    Patient ID: Patricia [name], female    DOB: [birth year], 68 y.o.   MRN: [id number]  HPI presents for her wellness exam. Since last PE, she has had TVH and rectocele repair. Denies any problems. No new sexual partners. Regular dental exams. Needs vision exam. Active lifestyle. Doing well with diet. Had labs done at work; will have a copy sent to [name].    Review of Systems  Constitutional: Negative for fever, activity change, appetite change and fatigue.  HENT: Negative for dental problem, ear pain, sinus pressure and sore throat.   Respiratory: Negative for cough, chest tightness, shortness of breath and wheezing.   Cardiovascular: Negative for chest pain.  Gastrointestinal: Negative for nausea, vomiting, abdominal pain, diarrhea, constipation, blood in stool and abdominal distention.  Genitourinary: Negative for dysuria, urgency, frequency, vaginal discharge, enuresis, difficulty urinating, genital sores and pelvic pain.       Objective:   Physical Exam  Constitutional: She is oriented to person, place, and time. She appears well-developed. No distress.  HENT:  Right Ear: External ear normal.  Left Ear: External ear normal.  Mouth/Throat: Oropharynx is clear and moist.  Neck: Normal range of motion. Neck supple. No tracheal deviation present. No thyromegaly present.  Cardiovascular: Normal rate, regular rhythm and normal heart sounds.  Exam reveals no gallop.   No murmur heard. Pulmonary/Chest: Effort normal and breath sounds normal.  Abdominal: Soft. She exhibits no distension. There is no tenderness.  Genitourinary: Vagina normal. No vaginal discharge found.  External GU: pale and dry. Vagina: no discharge. Bimanual exam: no tenderness or obvious masses. Rectal exam: no masses; no stool for hemoccult.  Musculoskeletal: She exhibits no edema.  Lymphadenopathy:    She has no cervical adenopathy.  Neurological: She is alert and oriented to person, place, and time.    Skin: Skin is warm and dry. No rash noted.  Psychiatric: She has a normal mood and affect. Her behavior is normal. Thought content normal.  Vitals reviewed. Breast exam: no masses; axillae no adenopathy.        Assessment & Plan:  Well woman exam - Plan: POC Hemoccult Bld/Stl (3-Cd Home Screen)  Visit for screening mammogram - Plan: MM DIGITAL SCREENING BILATERAL  Continue healthy diet and activity. Daily vitamin D and calcium. Return in about 1 year (around 11/29/2015).

## 2014-12-01 ENCOUNTER — Other Ambulatory Visit: Payer: Self-pay | Admitting: Nurse Practitioner

## 2014-12-05 ENCOUNTER — Ambulatory Visit (HOSPITAL_COMMUNITY): Payer: Self-pay

## 2014-12-27 ENCOUNTER — Other Ambulatory Visit: Payer: Self-pay | Admitting: *Deleted

## 2014-12-27 DIAGNOSIS — Z01419 Encounter for gynecological examination (general) (routine) without abnormal findings: Secondary | ICD-10-CM

## 2014-12-27 LAB — POC HEMOCCULT BLD/STL (HOME/3-CARD/SCREEN)
Card #2 Fecal Occult Blod, POC: NEGATIVE
Card #3 Fecal Occult Blood, POC: NEGATIVE
Fecal Occult Blood, POC: NEGATIVE

## 2015-01-02 ENCOUNTER — Ambulatory Visit (HOSPITAL_COMMUNITY)
Admission: RE | Admit: 2015-01-02 | Discharge: 2015-01-02 | Disposition: A | Discharge status: Home / Self Care | Payer: BLUE CROSS/BLUE SHIELD | Source: Ambulatory Visit | Attending: Nurse Practitioner | Admitting: Nurse Practitioner

## 2015-01-02 DIAGNOSIS — Z1231 Encounter for screening mammogram for malignant neoplasm of breast: Secondary | ICD-10-CM

## 2015-01-05 ENCOUNTER — Other Ambulatory Visit: Payer: Self-pay | Admitting: Family Medicine

## 2015-02-26 ENCOUNTER — Other Ambulatory Visit: Payer: Self-pay | Admitting: Family Medicine

## 2015-05-27 ENCOUNTER — Other Ambulatory Visit: Payer: Self-pay | Admitting: Family Medicine

## 2015-05-27 DIAGNOSIS — E785 Hyperlipidemia, unspecified: Secondary | ICD-10-CM

## 2015-05-27 DIAGNOSIS — Z79899 Other long term (current) drug therapy: Secondary | ICD-10-CM

## 2015-05-29 ENCOUNTER — Telehealth: Payer: Self-pay | Admitting: *Deleted

## 2015-05-29 NOTE — Telephone Encounter (Signed)
Needs lipid and liver profiles.

## 2015-05-29 NOTE — Telephone Encounter (Signed)
Patricia [name] sent a message with rx response that pt needed lipid and liver. Orders put in for bw and pt notified on voicemail that she needed fasting bw and to go to [facility].

## 2015-05-30 ENCOUNTER — Other Ambulatory Visit: Payer: Self-pay | Admitting: Nurse Practitioner

## 2015-06-30 ENCOUNTER — Other Ambulatory Visit: Payer: Self-pay | Admitting: Family Medicine

## 2015-06-30 NOTE — Telephone Encounter (Signed)
Needs office visit.

## 2015-06-30 NOTE — Telephone Encounter (Signed)
May refill this +1 additional needs office visit

## 2015-08-03 ENCOUNTER — Ambulatory Visit: Payer: Self-pay | Admitting: Nurse Practitioner

## 2015-10-26 ENCOUNTER — Telehealth: Payer: Self-pay | Admitting: Family Medicine

## 2015-10-26 ENCOUNTER — Other Ambulatory Visit: Payer: Self-pay | Admitting: Nurse Practitioner

## 2015-10-26 NOTE — Telephone Encounter (Signed)
Patient requesting refill on Paxil 30 mg. Last seen December 2015. May we refill?

## 2015-11-30 ENCOUNTER — Encounter: Payer: BLUE CROSS/BLUE SHIELD | Admitting: Nurse Practitioner

## 2015-12-01 ENCOUNTER — Ambulatory Visit (INDEPENDENT_AMBULATORY_CARE_PROVIDER_SITE_OTHER): Payer: BLUE CROSS/BLUE SHIELD | Admitting: Nurse Practitioner

## 2015-12-01 ENCOUNTER — Encounter: Payer: Self-pay | Admitting: Nurse Practitioner

## 2015-12-01 VITALS — BP 120/70 | Ht <= 58 in | Wt 128.2 lb

## 2015-12-01 DIAGNOSIS — Z780 Asymptomatic menopausal state: Secondary | ICD-10-CM

## 2015-12-01 DIAGNOSIS — Z23 Encounter for immunization: Secondary | ICD-10-CM

## 2015-12-01 DIAGNOSIS — Z0000 Encounter for general adult medical examination without abnormal findings: Secondary | ICD-10-CM

## 2015-12-01 DIAGNOSIS — Z13820 Encounter for screening for osteoporosis: Secondary | ICD-10-CM | POA: Diagnosis not present

## 2015-12-01 MED ORDER — ALPRAZOLAM 0.5 MG PO TABS: 0.5000 mg | ORAL_TABLET | Freq: Every evening | ORAL | Status: DC | PRN

## 2015-12-01 MED ORDER — PAROXETINE HCL 30 MG PO TABS: 30.0000 mg | ORAL_TABLET | Freq: Every day | ORAL | Status: DC

## 2015-12-01 MED ORDER — OMEPRAZOLE 20 MG PO CPDR: 20.0000 mg | DELAYED_RELEASE_CAPSULE | Freq: Two times a day (BID) | ORAL | Status: DC

## 2015-12-02 ENCOUNTER — Encounter: Payer: Self-pay | Admitting: Nurse Practitioner

## 2015-12-02 NOTE — Progress Notes (Signed)
   Subjective:    Patient ID: Patricia [name], female    DOB: [birth year], 69 y.o.   MRN: [id number]  HPI presents for her wellness exam. No new sexual partners. No pelvic pain. Regular vision exams. Has dental implants. No oral lesions. Active. Healthy diet. Has had labs done at work; no copy available today; will send copy to office. Also plans free mammogram through work in January. Take daily vitamin D.     Review of Systems  Constitutional: Negative for fever, activity change, appetite change and fatigue.  HENT: Negative for dental problem, ear pain, sinus pressure and sore throat.   Respiratory: Negative for cough, chest tightness, shortness of breath and wheezing.   Cardiovascular: Negative for chest pain.  Gastrointestinal: Negative for nausea, vomiting, abdominal pain, diarrhea, constipation and abdominal distention.  Genitourinary: Negative for dysuria, urgency, frequency, vaginal discharge, enuresis, difficulty urinating, genital sores and pelvic pain.       Objective:   Physical Exam  Constitutional: She is oriented to person, place, and time. She appears well-developed. No distress.  HENT:  Right Ear: External ear normal.  Left Ear: External ear normal.  Mouth/Throat: Oropharynx is clear and moist.  Neck: Normal range of motion. Neck supple. No tracheal deviation present. No thyromegaly present.  Cardiovascular: Normal rate, regular rhythm and normal heart sounds.  Exam reveals no gallop.   No murmur heard. Pulmonary/Chest: Effort normal and breath sounds normal.  Abdominal: Soft. She exhibits no distension. There is no tenderness.  Genitourinary: Vagina normal. No vaginal discharge found.  External GU: no rashes or lesions. Vagina: pale, no discharge. Bimanual exam: no tenderness or obvious masses. Rectal exam: no masses; no stool for hemoccult.   Musculoskeletal: She exhibits no edema.  Lymphadenopathy:    She has no cervical adenopathy.  Neurological: She is alert  and oriented to person, place, and time.  Skin: Skin is warm and dry. No rash noted.  Psychiatric: She has a normal mood and affect. Her behavior is normal.  Vitals reviewed. Breast exam: no masses; axillae no adenopathy.        Assessment & Plan:  Routine general medical examination at a health care facility - Plan: POC Hemoccult Bld/Stl (3-Cd Home Screen), POC Hemoccult Bld/Stl (3-Cd Home Screen)  Need for vaccination - Plan: Pneumococcal conjugate vaccine 13-valent IM  Screening for osteoporosis - Plan: CANCELED: DG Bone Density  Post-menopausal - Plan: DG Bone Density  Return in about 6 months (around 05/31/2016) for recheck.

## 2015-12-06 ENCOUNTER — Ambulatory Visit (HOSPITAL_COMMUNITY)
Admission: RE | Admit: 2015-12-06 | Discharge: 2015-12-06 | Disposition: A | Discharge status: Home / Self Care | Payer: BLUE CROSS/BLUE SHIELD | Source: Ambulatory Visit | Attending: Nurse Practitioner | Admitting: Nurse Practitioner

## 2015-12-06 DIAGNOSIS — Z780 Asymptomatic menopausal state: Secondary | ICD-10-CM | POA: Insufficient documentation

## 2015-12-06 DIAGNOSIS — M8580 Other specified disorders of bone density and structure, unspecified site: Secondary | ICD-10-CM | POA: Diagnosis not present

## 2015-12-25 ENCOUNTER — Telehealth: Payer: Self-pay | Admitting: Family Medicine

## 2015-12-25 ENCOUNTER — Other Ambulatory Visit: Payer: Self-pay | Admitting: Nurse Practitioner

## 2015-12-25 NOTE — Telephone Encounter (Signed)
Left message to return call 

## 2015-12-25 NOTE — Telephone Encounter (Signed)
(  Message Hoyle Sauer) patient states you discuss at her last visit if she need a prescription for valtrex you would call in a 10 day supply for her. She is going to get a permant lip color on her lips. [employer]

## 2015-12-25 NOTE — Telephone Encounter (Signed)
Please clarify, does she want this for fever blisters?

## 2015-12-26 NOTE — Telephone Encounter (Signed)
Patient stated the place doing the procedure stated she needs to do this for 10 days before the procedure to prevent fever blisters after the procedure.

## 2015-12-27 ENCOUNTER — Other Ambulatory Visit: Payer: Self-pay | Admitting: Nurse Practitioner

## 2015-12-27 MED ORDER — VALACYCLOVIR HCL 500 MG PO TABS: 500.0000 mg | ORAL_TABLET | Freq: Every day | ORAL | Status: DC

## 2015-12-27 NOTE — Telephone Encounter (Signed)
Sent in the dose to prevent fever blisters to [facility]

## 2015-12-27 NOTE — Telephone Encounter (Signed)
Patient notified

## 2016-01-08 LAB — POC HEMOCCULT BLD/STL (HOME/3-CARD/SCREEN)
Card #1 Date: 10082017
FECAL OCCULT BLD: NEGATIVE
FECAL OCCULT BLD: NEGATIVE
FECAL OCCULT BLD: NEGATIVE

## 2016-04-10 ENCOUNTER — Other Ambulatory Visit: Payer: Self-pay

## 2016-04-26 ENCOUNTER — Telehealth: Payer: Self-pay | Admitting: Family Medicine

## 2016-04-26 NOTE — Telephone Encounter (Signed)
PARoxetine (PAXIL) 30 MG tablet omeprazole (PRILOSEC) 20 MG capsule pravastatin (PRAVACHOL) 40 MG tablet  ALPRAZolam (XANAX) 0.5 MG tablet  valACYclovir (VALTREX) 500 MG tablet polyethylene glycol powder (MIRALAX) powder (states she made need a refill on this)   Please send refills [facility] now

## 2016-04-26 NOTE — Telephone Encounter (Signed)
Last seen 11/2015 for physical

## 2016-04-29 ENCOUNTER — Other Ambulatory Visit: Payer: Self-pay | Admitting: Nurse Practitioner

## 2016-04-29 MED ORDER — PRAVASTATIN SODIUM 40 MG PO TABS: 40.0000 mg | ORAL_TABLET | Freq: Every day | ORAL | Status: DC

## 2016-04-29 MED ORDER — OMEPRAZOLE 20 MG PO CPDR: 20.0000 mg | DELAYED_RELEASE_CAPSULE | Freq: Two times a day (BID) | ORAL | Status: DC

## 2016-04-29 MED ORDER — PAROXETINE HCL 30 MG PO TABS: 30.0000 mg | ORAL_TABLET | Freq: Every day | ORAL | Status: DC

## 2016-04-29 MED ORDER — ALPRAZOLAM 0.5 MG PO TABS: 0.5000 mg | ORAL_TABLET | Freq: Every evening | ORAL | Status: DC | PRN

## 2016-04-29 MED ORDER — POLYETHYLENE GLYCOL 3350 17 GM/SCOOP PO POWD: 17.0000 g | Freq: Every day | ORAL | Status: DC

## 2016-04-29 MED ORDER — VALACYCLOVIR HCL 500 MG PO TABS: 500.0000 mg | ORAL_TABLET | Freq: Every day | ORAL | Status: DC

## 2016-04-29 NOTE — Telephone Encounter (Signed)
Everything sent into [facility] but Xanax and Miralax send to [facility]. If her insurance will cover Miralax and she wants this for mail in as well let me know.

## 2016-04-29 NOTE — Telephone Encounter (Signed)
Patient notified

## 2016-04-30 DIAGNOSIS — Z713 Dietary counseling and surveillance: Secondary | ICD-10-CM | POA: Diagnosis not present

## 2016-04-30 DIAGNOSIS — E669 Obesity, unspecified: Secondary | ICD-10-CM | POA: Diagnosis not present

## 2016-04-30 DIAGNOSIS — E785 Hyperlipidemia, unspecified: Secondary | ICD-10-CM | POA: Diagnosis not present

## 2016-05-02 ENCOUNTER — Telehealth: Payer: Self-pay | Admitting: *Deleted

## 2016-05-02 NOTE — Telephone Encounter (Signed)
Omeprazole 20 mg # 60 one BID approved 04/29/16-04/29/26. Pharmacy notified. Case Number [id number]

## 2016-05-21 DIAGNOSIS — Z1231 Encounter for screening mammogram for malignant neoplasm of breast: Secondary | ICD-10-CM | POA: Diagnosis not present

## 2016-05-28 DIAGNOSIS — Z139 Encounter for screening, unspecified: Secondary | ICD-10-CM | POA: Diagnosis not present

## 2016-05-28 DIAGNOSIS — M810 Age-related osteoporosis without current pathological fracture: Secondary | ICD-10-CM | POA: Diagnosis not present

## 2016-05-28 DIAGNOSIS — K219 Gastro-esophageal reflux disease without esophagitis: Secondary | ICD-10-CM | POA: Diagnosis not present

## 2016-05-28 DIAGNOSIS — R7301 Impaired fasting glucose: Secondary | ICD-10-CM | POA: Diagnosis not present

## 2016-05-28 DIAGNOSIS — Z79899 Other long term (current) drug therapy: Secondary | ICD-10-CM | POA: Diagnosis not present

## 2016-05-28 DIAGNOSIS — E785 Hyperlipidemia, unspecified: Secondary | ICD-10-CM | POA: Diagnosis not present

## 2016-05-30 ENCOUNTER — Ambulatory Visit (INDEPENDENT_AMBULATORY_CARE_PROVIDER_SITE_OTHER): Payer: BLUE CROSS/BLUE SHIELD | Admitting: Nurse Practitioner

## 2016-05-30 ENCOUNTER — Encounter: Payer: Self-pay | Admitting: Nurse Practitioner

## 2016-05-30 VITALS — BP 138/82 | Ht <= 58 in | Wt 135.4 lb

## 2016-05-30 DIAGNOSIS — R7301 Impaired fasting glucose: Secondary | ICD-10-CM

## 2016-05-30 DIAGNOSIS — K219 Gastro-esophageal reflux disease without esophagitis: Secondary | ICD-10-CM

## 2016-05-30 DIAGNOSIS — E785 Hyperlipidemia, unspecified: Secondary | ICD-10-CM | POA: Diagnosis not present

## 2016-05-30 MED ORDER — PANTOPRAZOLE SODIUM 40 MG PO TBEC: 40.0000 mg | DELAYED_RELEASE_TABLET | Freq: Every day | ORAL | Status: DC

## 2016-05-30 NOTE — Patient Instructions (Signed)
Ask Malachy Mood to run a magnesium level due to PPI use

## 2016-05-30 NOTE — Progress Notes (Signed)
Subjective:  Presents for routine follow-up. Has been working more hours recently, this exercise than usual. Her insurance stopped paying for omeprazole, taking OTC Prevacid which actually works much better but this is expensive. Recently had her lab work done through her employer, will have a copy sent to our office. Takes daily multivitamin. No chest pain/ischemic type pain or shortness of breath. No TIA or stroke symptoms. Compliant with medications.  Objective:   BP 138/82 mmHg  Ht '4\' 10"'$  (1.473 m)  Wt 135 lb 6.4 oz (61.417 kg)  BMI 28.31 kg/m2 NAD. Alert, oriented. Lungs clear. Heart regular rate rhythm. Carotids no bruits or thrills. Abdomen soft nondistended nontender.  Assessment:  Problem List Items Addressed This Visit      Digestive   GERD (gastroesophageal reflux disease) - Primary   Relevant Medications   pantoprazole (PROTONIX) 40 MG tablet     Endocrine   Impaired fasting glucose     Other   Hyperlipemia (Chronic)     Plan:  Meds ordered this encounter  Medications  . pantoprazole (PROTONIX) 40 MG tablet    Sig: Take 1 tablet (40 mg total) by mouth daily.    Dispense:  90 tablet    Refill:  1    Order Specific Question:  Supervising Provider    Answer:  Mikey Kirschner [[id number]]   Discussed risk associated with prolonged PPI use. Patient to have employee health do a magnesium level with next labs. Switch to pantoprazole to see if this will be covered with her insurance. Watch caffeine intake. Continue daily multivitamin with vitamin D and calcium. Return in about 6 months (around 11/29/2016) for physical.

## 2016-05-31 ENCOUNTER — Ambulatory Visit: Payer: BLUE CROSS/BLUE SHIELD | Admitting: Nurse Practitioner

## 2016-06-04 ENCOUNTER — Telehealth: Payer: Self-pay | Admitting: *Deleted

## 2016-06-04 DIAGNOSIS — Z713 Dietary counseling and surveillance: Secondary | ICD-10-CM | POA: Diagnosis not present

## 2016-06-04 DIAGNOSIS — R7301 Impaired fasting glucose: Secondary | ICD-10-CM | POA: Diagnosis not present

## 2016-06-04 DIAGNOSIS — E785 Hyperlipidemia, unspecified: Secondary | ICD-10-CM | POA: Diagnosis not present

## 2016-06-04 DIAGNOSIS — E669 Obesity, unspecified: Secondary | ICD-10-CM | POA: Diagnosis not present

## 2016-06-04 NOTE — Telephone Encounter (Signed)
[facility] called to clarify which rx pt should be on. Filled lansoprazole '30mg'$  from Dr. Carlis Abbott recently. It cost $25. Pantoprazole '40mg'$  was sent in a few day after filling 90 day supply of lansoprazole. 90 day supply of pantoprazole '40mg'$  would be $8. A note was faxed stating pt just wanted generic alternative to omeprazole bc insurance stopped paying. Please advise. [facility] would like a call back [phone]. Pharm notified carolyn would be back on Thursday and would get a call then.

## 2016-06-06 NOTE — Telephone Encounter (Signed)
Nurses: I wrote a note on the last fax about the information that Nicoletta provided. She wanted the generic Protonix as prescribed with her next refill because of cost.

## 2016-06-06 NOTE — Telephone Encounter (Signed)
Notified pharmacist with [facility]. She will make the change in their system.

## 2016-08-02 DIAGNOSIS — Z713 Dietary counseling and surveillance: Secondary | ICD-10-CM | POA: Diagnosis not present

## 2016-08-02 DIAGNOSIS — E669 Obesity, unspecified: Secondary | ICD-10-CM | POA: Diagnosis not present

## 2016-08-02 DIAGNOSIS — E785 Hyperlipidemia, unspecified: Secondary | ICD-10-CM | POA: Diagnosis not present

## 2016-08-21 ENCOUNTER — Other Ambulatory Visit: Payer: Self-pay | Admitting: Family Medicine

## 2016-08-21 MED ORDER — PANTOPRAZOLE SODIUM 40 MG PO TBEC: 40.0000 mg | DELAYED_RELEASE_TABLET | Freq: Every day | ORAL | 1 refills | Status: DC

## 2016-08-21 NOTE — Telephone Encounter (Signed)
Pt is needing a refill on her pantoprazole (PROTONIX) 40 MG tablet   [facility]

## 2016-08-21 NOTE — Telephone Encounter (Signed)
Left message on voicemail notifying patient refill sent to pharmacy.

## 2016-08-30 ENCOUNTER — Telehealth: Payer: Self-pay | Admitting: Family Medicine

## 2016-08-30 MED ORDER — PAROXETINE HCL 30 MG PO TABS: 30.0000 mg | ORAL_TABLET | Freq: Every day | ORAL | 1 refills | Status: DC

## 2016-08-30 NOTE — Telephone Encounter (Signed)
May have this +5 refills 

## 2016-08-30 NOTE — Telephone Encounter (Signed)
Pt is requesting a refill on her PARoxetine (PAXIL) 30 MG tablet   [facility]

## 2016-08-30 NOTE — Telephone Encounter (Signed)
Prescription sent electronically to pharmacy. Patient notified. 

## 2016-09-11 ENCOUNTER — Encounter (HOSPITAL_COMMUNITY): Payer: Self-pay | Admitting: Emergency Medicine

## 2016-09-11 ENCOUNTER — Emergency Department (HOSPITAL_COMMUNITY)
Admission: EM | Admit: 2016-09-11 | Discharge: 2016-09-11 | Disposition: A | Discharge status: Home / Self Care | Payer: BLUE CROSS/BLUE SHIELD | Attending: Emergency Medicine | Admitting: Emergency Medicine

## 2016-09-11 DIAGNOSIS — R51 Headache: Secondary | ICD-10-CM | POA: Diagnosis not present

## 2016-09-11 DIAGNOSIS — R519 Headache, unspecified: Secondary | ICD-10-CM

## 2016-09-11 DIAGNOSIS — Z87891 Personal history of nicotine dependence: Secondary | ICD-10-CM | POA: Insufficient documentation

## 2016-09-11 DIAGNOSIS — Z79899 Other long term (current) drug therapy: Secondary | ICD-10-CM | POA: Insufficient documentation

## 2016-09-11 MED ORDER — DIPHENHYDRAMINE HCL 50 MG/ML IJ SOLN
12.5000 mg | Freq: Once | INTRAMUSCULAR | Status: AC
  Administered 2016-09-11: 12.5 mg via INTRAVENOUS
  Filled 2016-09-11: qty 1

## 2016-09-11 MED ORDER — METOCLOPRAMIDE HCL 5 MG/ML IJ SOLN
10.0000 mg | Freq: Once | INTRAMUSCULAR | Status: AC
  Administered 2016-09-11: 10 mg via INTRAVENOUS
  Filled 2016-09-11: qty 2

## 2016-09-11 MED ORDER — KETOROLAC TROMETHAMINE 30 MG/ML IJ SOLN
15.0000 mg | Freq: Once | INTRAMUSCULAR | Status: AC
  Administered 2016-09-11: 15 mg via INTRAVENOUS
  Filled 2016-09-11: qty 1

## 2016-09-11 NOTE — ED Provider Notes (Signed)
[facility] DEPT Provider Note   CSN: [id number] Arrival date & time: 09/11/16  0920  History   Chief Complaint Chief Complaint  Patient presents with  . Headache    HPI Patricia [name] is a 70 y.o. female with GERD, depression.  HPI  She presents with headache that started last night. Located in the frontal region. Started out mild, constant and kept increasing in severity overnight. Burning in nature. She was sitting watching TV at the time. No exacerbating factors. No photophobia. She also has nausea. She has chills. She denies dizziness. No vision changes. She took Aleve and a sinus medication. She also had coffee this morning - normally drinks 2-3 cups a day. Presently, it is improving. Now a 7/10. Feels similar to when she has "inner ear problems" but more severe. Rest usually makes these episodes improve. Last occurred a year ago.   Denies fever or weight loss. No vomiting. No confusion or LOC. No head trauma. No neck stiffness or pain. No seizures. No sick contacts. No recent stressors. No toxic exposures. No illicit drugs.   She has been having more heartburn than usual for the last week. She takes Protonix daily. No hematochezia or melena. No changes in diet recently.   Past Medical History:  Diagnosis Date  . Depression   . GERD (gastroesophageal reflux disease)   . Hypercholesterolemia   . IFG (impaired fasting glucose)   . Osteopenia   . PONV (postoperative nausea and vomiting)     Patient Active Problem List   Diagnosis Date Noted  . GERD (gastroesophageal reflux disease) 04/11/2014  . Insomnia 04/11/2014  . Post-operative state 02/07/2014  . Status post hysterectomy 01/18/2014  . Rectocele, grade 3 12/22/2013  . Impaired fasting glucose 08/27/2013  . Hyperlipemia 08/27/2013  . Osteopenia 08/27/2013    Past Surgical History:  Procedure Laterality Date  . ABDOMINAL HYSTERECTOMY    . CATARACT EXTRACTION W/PHACO  09/12/2011   Procedure: CATARACT  EXTRACTION PHACO AND INTRAOCULAR LENS PLACEMENT (IOC);  Surgeon: Tonny Branch;  Location: [facility];  Service: Ophthalmology;  Laterality: Left;  CDE: 8.91  . CATARACT EXTRACTION W/PHACO  11/18/2011   Procedure: CATARACT EXTRACTION PHACO AND INTRAOCULAR LENS PLACEMENT (IOC);  Surgeon: Tonny Branch;  Location: [facility];  Service: Ophthalmology;  Laterality: Right;  CDE:10.26  . COLONOSCOPY    . ECTOPIC PREGNANCY SURGERY     removal of right tube  . OVARIAN CYST REMOVAL     right  . RECTOCELE REPAIR N/A 01/18/2014   Procedure: POSTERIOR REPAIR (RECTOCELE);  Surgeon: Jonnie Kind, MD;  Location: [facility];  Service: Gynecology;  Laterality: N/A;  . VAGINAL HYSTERECTOMY N/A 01/18/2014   Procedure: HYSTERECTOMY VAGINAL;  Surgeon: Jonnie Kind, MD;  Location: [facility];  Service: Gynecology;  Laterality: N/A;    Home Medications    Prior to Admission medications   Medication Sig Start Date End Date Taking? Authorizing Provider  ALPRAZolam Duanne Moron) 0.5 MG tablet Take 1 tablet (0.5 mg total) by mouth at bedtime as needed for sleep. 04/29/16  Yes Nilda Simmer, NP  Calcium Carb-Cholecalciferol (CALCIUM 600 + D PO) Take 1 tablet by mouth daily.   Yes Historical Provider, MD  Multiple Vitamins-Minerals (MULTIVITAMINS THER. W/MINERALS) TABS Take 1 tablet by mouth daily.     Yes Historical Provider, MD  naproxen sodium (ANAPROX) 220 MG tablet Take 220-440 mg by mouth daily as needed (pain).   Yes Historical Provider, MD  pantoprazole (PROTONIX) 40 MG tablet Take 1 tablet (40  mg total) by mouth daily. 08/21/16  Yes [name], MD  PARoxetine (PAXIL) 30 MG tablet Take 1 tablet (30 mg total) by mouth daily. 08/30/16  Yes [name], MD  polyethylene glycol powder (MIRALAX) powder Take 17 g by mouth daily. To prevent constipation Patient taking differently: Take 17 g by mouth daily as needed for mild constipation. To prevent constipation 04/29/16  Yes Nilda Simmer, NP  pravastatin (PRAVACHOL) 40 MG tablet  Take 1 tablet (40 mg total) by mouth at bedtime. 04/29/16  Yes Nilda Simmer, NP    Family History Family History  Problem Relation Age of Onset  . Heart attack Father   . Osteoporosis Mother   . Heart attack Brother 54    MI  . Breast cancer Maternal Aunt   . Breast cancer Paternal Aunt   . Cancer Paternal Aunt     ovarian cancer  . Anesthesia problems Neg Hx   . Hypotension Neg Hx   . Malignant hyperthermia Neg Hx   . Pseudochol deficiency Neg Hx     Social History Social History  Substance Use Topics  . Smoking status: Former Smoker    Packs/day: 1.00    Years: 20.00    Types: Cigarettes    Quit date: 09/10/1984  . Smokeless tobacco: Never Used  . Alcohol use No     Allergies   Codeine   Review of Systems Review of Systems Constitutional: no fevers/chills Eyes: no vision changes Ears, nose, mouth, throat, and face: no cough, no rhinorrhea Respiratory: no shortness of breath Cardiovascular: no chest pain Gastrointestinal: +nausea, no vomiting, no abdominal pain, no constipation, no diarrhea Genitourinary: no dysuria, no hematuria Integument: no rash Hematologic/lymphatic: no bleeding/bruising, no edema Musculoskeletal: no arthralgias, no myalgias Neurological: no paresthesias, no weakness   Physical Exam Updated Vital Signs BP 157/94   Pulse 71   Temp 97.8 F (36.6 C)   Resp 18   Ht '4\' 10"'$  (1.473 m)   Wt 61.2 kg   SpO2 100%   BMI 28.22 kg/m   Physical Exam General Apperance: NAD Head: Normocephalic, atraumatic Eyes: PERRL, EOMI, anicteric sclera Ears: Normal external ear canal Nose: Nares normal, septum midline, mucosa normal Throat: Lips, mucosa and tongue normal  Neck: Supple, trachea midline Back: No tenderness or bony abnormality  Lungs: Clear to auscultation bilaterally. No wheezes, rhonchi or rales. Breathing comfortably Chest Wall: Nontender, no deformity Heart: Regular rate and rhythm, no murmur/rub/gallop Abdomen: Soft,  nontender, nondistended, no rebound/guarding Extremities: Normal, atraumatic, warm and well perfused, no edema Pulses: 2+ throughout Skin: No rashes or lesions Neurologic: Alert and oriented x 3. CNII-XII intact. Normal strength and sensation  ED Treatments / Results  Procedures   Medications Ordered in ED Medications  ketorolac (TORADOL) 30 MG/ML injection 15 mg (15 mg Intravenous Given 09/11/16 1013)  diphenhydrAMINE (BENADRYL) injection 12.5 mg (12.5 mg Intravenous Given 09/11/16 1012)  metoCLOPramide (REGLAN) injection 10 mg (10 mg Intravenous Given 09/11/16 1012)     Initial Impression / Assessment and Plan / ED Course  I have reviewed the triage vital signs and the nursing notes.  Pertinent labs & imaging results that were available during my care of the patient were reviewed by me and considered in my medical decision making (see chart for details).  Clinical Course  9:56am - ordered Toradol, Benadryl and Reglan 11:20am - headache largely resolved  Final Clinical Impressions(s) / ED Diagnoses   Final diagnoses:  Headache disorder  Headache. Consistent with tension  type, but may be migraine. No red flags. No signs or symptoms consistent with infection. She was given Toradol, Benadryl and Reglan IV. Headache greatly improved with medication. Will d/c home with follow up with PCP.  New Prescriptions New Prescriptions   No medications on file     Milagros Loll, MD 09/11/16 [place], MD 09/11/16 1430

## 2016-09-11 NOTE — ED Triage Notes (Signed)
Pt c/o headache with nausea and indigestion since last night.

## 2016-10-01 DIAGNOSIS — Z23 Encounter for immunization: Secondary | ICD-10-CM | POA: Diagnosis not present

## 2016-11-05 DIAGNOSIS — Z79899 Other long term (current) drug therapy: Secondary | ICD-10-CM | POA: Diagnosis not present

## 2016-11-05 DIAGNOSIS — E785 Hyperlipidemia, unspecified: Secondary | ICD-10-CM | POA: Diagnosis not present

## 2016-11-14 DIAGNOSIS — Z7189 Other specified counseling: Secondary | ICD-10-CM | POA: Diagnosis not present

## 2016-11-14 DIAGNOSIS — E785 Hyperlipidemia, unspecified: Secondary | ICD-10-CM | POA: Diagnosis not present

## 2016-12-04 ENCOUNTER — Ambulatory Visit: Payer: BLUE CROSS/BLUE SHIELD | Admitting: Nurse Practitioner

## 2016-12-04 ENCOUNTER — Telehealth: Payer: Self-pay | Admitting: Nurse Practitioner

## 2016-12-04 MED ORDER — PAROXETINE HCL 30 MG PO TABS: 30.0000 mg | ORAL_TABLET | Freq: Every day | ORAL | 0 refills | Status: DC

## 2016-12-04 MED ORDER — PANTOPRAZOLE SODIUM 40 MG PO TBEC: 40.0000 mg | DELAYED_RELEASE_TABLET | Freq: Every day | ORAL | 0 refills | Status: DC

## 2016-12-04 NOTE — Telephone Encounter (Signed)
Prescription sent electronically to pharmacy. Patient notified and advised to keep follow up office visit.

## 2016-12-04 NOTE — Telephone Encounter (Signed)
Patient needs Rx for paroxetine and pantoprazole sent to [facility].  She missed her appointment this morning, but rescheduled for 12/19/16.

## 2016-12-19 ENCOUNTER — Encounter: Payer: Self-pay | Admitting: Nurse Practitioner

## 2016-12-19 ENCOUNTER — Ambulatory Visit (INDEPENDENT_AMBULATORY_CARE_PROVIDER_SITE_OTHER): Payer: BLUE CROSS/BLUE SHIELD | Admitting: Nurse Practitioner

## 2016-12-19 VITALS — BP 128/80 | Ht <= 58 in | Wt 137.0 lb

## 2016-12-19 DIAGNOSIS — Z1211 Encounter for screening for malignant neoplasm of colon: Secondary | ICD-10-CM | POA: Diagnosis not present

## 2016-12-19 DIAGNOSIS — N993 Prolapse of vaginal vault after hysterectomy: Secondary | ICD-10-CM | POA: Diagnosis not present

## 2016-12-19 DIAGNOSIS — K219 Gastro-esophageal reflux disease without esophagitis: Secondary | ICD-10-CM

## 2016-12-19 DIAGNOSIS — Z01419 Encounter for gynecological examination (general) (routine) without abnormal findings: Secondary | ICD-10-CM | POA: Diagnosis not present

## 2016-12-19 MED ORDER — DEXLANSOPRAZOLE 60 MG PO CPDR: 60.0000 mg | DELAYED_RELEASE_CAPSULE | Freq: Every day | ORAL | 11 refills | Status: DC

## 2016-12-19 NOTE — Patient Instructions (Signed)
Stop Pantoprazole; start Dexilant

## 2016-12-20 ENCOUNTER — Encounter: Payer: Self-pay | Admitting: Nurse Practitioner

## 2016-12-20 ENCOUNTER — Encounter: Payer: Self-pay | Admitting: Family Medicine

## 2016-12-20 DIAGNOSIS — N993 Prolapse of vaginal vault after hysterectomy: Secondary | ICD-10-CM | POA: Insufficient documentation

## 2016-12-20 NOTE — Progress Notes (Signed)
Subjective:    Patient ID: Patricia [name], female    DOB: 02-23-1946, 71 y.o.   MRN: [id number]  HPI presents for her wellness exam. Has her labs from work with her today. Will get mammogram done through work as well. Regular vision exams. Has dentures; no oral lesions. Still sees dentist. Regular exercise. Feels pressure in the vaginal area but no urinary symptoms. Also, Protonix no longer working for her reflux. Is not sexually active.     Review of Systems  Constitutional: Negative for activity change, appetite change, fatigue and fever.  HENT: Negative for dental problem, ear pain, mouth sores, sinus pressure and sore throat.   Respiratory: Negative for cough, chest tightness, shortness of breath and wheezing.   Cardiovascular: Negative for chest pain.  Gastrointestinal: Negative for abdominal distention, abdominal pain, blood in stool, constipation, diarrhea, nausea and vomiting.       Frequent acid reflux.   Genitourinary: Negative for difficulty urinating, dysuria, enuresis, frequency, genital sores, pelvic pain, urgency and vaginal discharge.       Objective:   Physical Exam  Constitutional: She is oriented to person, place, and time. She appears well-developed. No distress.  HENT:  Right Ear: External ear normal.  Left Ear: External ear normal.  Mouth/Throat: Oropharynx is clear and moist.  Neck: Normal range of motion. Neck supple. No tracheal deviation present. No thyromegaly present.  Cardiovascular: Normal rate, regular rhythm and normal heart sounds.  Exam reveals no gallop.   No murmur heard. Pulmonary/Chest: Effort normal and breath sounds normal.  Abdominal: Soft. She exhibits no distension and no mass. There is no tenderness. There is no rebound and no guarding.  Genitourinary: Vagina normal. No vaginal discharge found.  Genitourinary Comments: External GU: no rashes or lesions noted. Vagina: prolapse noted at the introitus. No evidence of cystocele or rectocele  with valsalva. Rectal exam: no masses; no stool for hemoccult.   Musculoskeletal: She exhibits no edema.  Lymphadenopathy:    She has no cervical adenopathy.  Neurological: She is alert and oriented to person, place, and time.  Skin: Skin is warm and dry. No rash noted.  Psychiatric: She has a normal mood and affect. Her behavior is normal.  Vitals reviewed. Breast exam: no masses; axillae no adenopathy.         Assessment & Plan:   Problem List Items Addressed This Visit      Digestive   GERD (gastroesophageal reflux disease)   Relevant Medications   dexlansoprazole (DEXILANT) 60 MG capsule   Other Relevant Orders   Ambulatory referral to Gastroenterology     Genitourinary   Prolapse of vaginal vault after hysterectomy    Other Visit Diagnoses    Well woman exam    -  Primary   Screening for colon cancer       Relevant Orders   Ambulatory referral to Gastroenterology      Meds ordered this encounter  Medications  . dexlansoprazole (DEXILANT) 60 MG capsule    Sig: Take 1 capsule (60 mg total) by mouth daily. For acid reflux    Dispense:  30 capsule    Refill:  11    Order Specific Question:   Supervising Provider    Answer:   Mikey Kirschner [[id number]]   Stop Protonix. Start Dexilant. Call back in 2-3 weeks if not resolved. Also may need to address this with GI specialist when she goes for colonoscopy. Since she is asymptomatic, defers intervention for vaginal prolapse at this time.  Return in about 6 months (around 06/18/2017) for recheck.

## 2016-12-23 ENCOUNTER — Encounter: Payer: Self-pay | Admitting: Internal Medicine

## 2016-12-24 DIAGNOSIS — E669 Obesity, unspecified: Secondary | ICD-10-CM | POA: Diagnosis not present

## 2016-12-24 DIAGNOSIS — E785 Hyperlipidemia, unspecified: Secondary | ICD-10-CM | POA: Diagnosis not present

## 2016-12-24 DIAGNOSIS — Z713 Dietary counseling and surveillance: Secondary | ICD-10-CM | POA: Diagnosis not present

## 2017-01-07 ENCOUNTER — Encounter: Payer: Self-pay | Admitting: Family Medicine

## 2017-01-08 ENCOUNTER — Ambulatory Visit: Payer: BLUE CROSS/BLUE SHIELD | Admitting: Gastroenterology

## 2017-01-27 ENCOUNTER — Other Ambulatory Visit: Payer: Self-pay | Admitting: Nurse Practitioner

## 2017-01-29 ENCOUNTER — Encounter: Payer: Self-pay | Admitting: Gastroenterology

## 2017-01-29 ENCOUNTER — Ambulatory Visit: Payer: BLUE CROSS/BLUE SHIELD | Admitting: Gastroenterology

## 2017-01-29 ENCOUNTER — Telehealth: Payer: Self-pay | Admitting: Gastroenterology

## 2017-01-29 NOTE — Telephone Encounter (Signed)
PT WAS A NO SHOW AND LETTER SENT  °

## 2017-01-31 ENCOUNTER — Other Ambulatory Visit: Payer: Self-pay | Admitting: Nurse Practitioner

## 2017-02-18 DIAGNOSIS — E669 Obesity, unspecified: Secondary | ICD-10-CM | POA: Diagnosis not present

## 2017-02-18 DIAGNOSIS — Z713 Dietary counseling and surveillance: Secondary | ICD-10-CM | POA: Diagnosis not present

## 2017-02-18 DIAGNOSIS — E785 Hyperlipidemia, unspecified: Secondary | ICD-10-CM | POA: Diagnosis not present

## 2017-02-19 ENCOUNTER — Encounter: Payer: Self-pay | Admitting: Gastroenterology

## 2017-02-19 ENCOUNTER — Ambulatory Visit (INDEPENDENT_AMBULATORY_CARE_PROVIDER_SITE_OTHER): Payer: BLUE CROSS/BLUE SHIELD | Admitting: Gastroenterology

## 2017-02-19 ENCOUNTER — Other Ambulatory Visit: Payer: Self-pay

## 2017-02-19 VITALS — BP 149/78 | HR 67 | Temp 97.6°F | Ht <= 58 in | Wt 135.0 lb

## 2017-02-19 DIAGNOSIS — Z1211 Encounter for screening for malignant neoplasm of colon: Secondary | ICD-10-CM

## 2017-02-19 DIAGNOSIS — K219 Gastro-esophageal reflux disease without esophagitis: Secondary | ICD-10-CM

## 2017-02-19 DIAGNOSIS — Z5112 Encounter for antineoplastic immunotherapy: Secondary | ICD-10-CM | POA: Insufficient documentation

## 2017-02-19 MED ORDER — NA SULFATE-K SULFATE-MG SULF 17.5-3.13-1.6 GM/177ML PO SOLN: 1.0000 | ORAL | 0 refills | Status: DC

## 2017-02-19 NOTE — Patient Instructions (Signed)
We have scheduled you for a colonoscopy with Dr. Gala Romney in the near future.  Continue Dexilant daily. Call if any issues with swallowing, nausea, vomiting, abdominal pain, weight loss.

## 2017-02-19 NOTE — Progress Notes (Signed)
cc'ed to pcp °

## 2017-02-19 NOTE — Progress Notes (Signed)
Primary Care Physician:  Sallee Lange, MD Primary Gastroenterologist:  Dr. Gala Romney   Chief Complaint  Patient presents with  . Gastroesophageal Reflux    better since started Dexilant  . Colonoscopy    due for TCS    HPI:   Patricia [name] is a 71 y.o. female presenting today at the request of her PCP secondary to GERD. She is also due for a routine colonoscopy, with last one in 2007. No history of polyps.   Had been on Prilosec, Protonix now on Dexilant and doing well. GERD controlled, no dysphagia. Taking daily. No abdominal pain, N/V. No constipation or diarrhea. Takes a laxative if needed.   Past Medical History:  Diagnosis Date  . Depression   . GERD (gastroesophageal reflux disease)   . Hypercholesterolemia   . IFG (impaired fasting glucose)   . Osteopenia   . PONV (postoperative nausea and vomiting)     Past Surgical History:  Procedure Laterality Date  . ABDOMINAL HYSTERECTOMY    . CATARACT EXTRACTION W/PHACO  09/12/2011   Procedure: CATARACT EXTRACTION PHACO AND INTRAOCULAR LENS PLACEMENT (IOC);  Surgeon: Tonny Branch;  Location: [facility];  Service: Ophthalmology;  Laterality: Left;  CDE: 8.91  . CATARACT EXTRACTION W/PHACO  11/18/2011   Procedure: CATARACT EXTRACTION PHACO AND INTRAOCULAR LENS PLACEMENT (IOC);  Surgeon: Tonny Branch;  Location: [facility];  Service: Ophthalmology;  Laterality: Right;  CDE:10.26  . COLONOSCOPY  2007   Dr. Gala Romney: internal hemorrhoids, single anal papilla poor prep.   Marland Kitchen ECTOPIC PREGNANCY SURGERY     removal of right tube  . OVARIAN CYST REMOVAL     right  . RECTOCELE REPAIR N/A 01/18/2014   Procedure: POSTERIOR REPAIR (RECTOCELE);  Surgeon: Jonnie Kind, MD;  Location: [facility];  Service: Gynecology;  Laterality: N/A;  . VAGINAL HYSTERECTOMY N/A 01/18/2014   Procedure: HYSTERECTOMY VAGINAL;  Surgeon: Jonnie Kind, MD;  Location: [facility];  Service: Gynecology;  Laterality: N/A;    Current Outpatient Prescriptions  Medication Sig  Dispense Refill  . ALPRAZolam (XANAX) 0.5 MG tablet Take 1 tablet (0.5 mg total) by mouth at bedtime as needed for sleep. 30 tablet 5  . Calcium Carb-Cholecalciferol (CALCIUM 600 + D PO) Take 1 tablet by mouth daily.    Marland Kitchen dexlansoprazole (DEXILANT) 60 MG capsule Take 1 capsule (60 mg total) by mouth daily. For acid reflux 30 capsule 11  . Multiple Vitamins-Minerals (MULTIVITAMINS THER. W/MINERALS) TABS Take 1 tablet by mouth daily.      . naproxen sodium (ANAPROX) 220 MG tablet Take 220-440 mg by mouth daily as needed (pain).    Marland Kitchen PARoxetine (PAXIL) 30 MG tablet TAKE 1 TABLET BY MOUTH  DAILY 90 tablet 1  . pravastatin (PRAVACHOL) 40 MG tablet Take 1 tablet (40 mg total) by mouth at bedtime. 90 tablet 1  . pantoprazole (PROTONIX) 40 MG tablet TAKE 1 TABLET BY MOUTH  DAILY (Patient not taking: Reported on 02/19/2017) 90 tablet 0   No current facility-administered medications for this visit.     Allergies as of 02/19/2017 - Review Complete 02/19/2017  Allergen Reaction Noted  . Codeine Nausea Only 09/11/2011    Family History  Problem Relation Age of Onset  . Heart attack Father   . Osteoporosis Mother   . Heart attack Brother 31    MI  . Breast cancer Maternal Aunt   . Breast cancer Paternal Aunt   . Cancer Paternal Aunt     ovarian cancer  .  Anesthesia problems Neg Hx   . Hypotension Neg Hx   . Malignant hyperthermia Neg Hx   . Pseudochol deficiency Neg Hx     Social History   Social History  . Marital status: Married    Spouse name: N/A  . Number of children: N/A  . Years of education: N/A   Occupational History  . Not on file.   Social History Main Topics  . Smoking status: Former Smoker    Packs/day: 1.00    Years: 20.00    Types: Cigarettes    Quit date: 09/10/1984  . Smokeless tobacco: Never Used  . Alcohol use No  . Drug use: No  . Sexual activity: Not Currently    Birth control/ protection: Post-menopausal   Other Topics Concern  . Not on file   Social  History Narrative  . No narrative on file    Review of Systems: Gen: Denies any fever, chills, fatigue, weight loss, lack of appetite.  CV: Denies chest pain, heart palpitations, peripheral edema, syncope.  Resp: Denies shortness of breath at rest or with exertion. Denies wheezing or cough.  GI: see HPI  GU : Denies urinary burning, urinary frequency, urinary hesitancy MS: Denies joint pain, muscle weakness, cramps, or limitation of movement.  Derm: Denies rash, itching, dry skin Psych: Denies depression, anxiety, memory loss, and confusion Heme: see HPI   Physical Exam: BP (!) 149/78   Pulse 67   Temp 97.6 F (36.4 C) (Oral)   Ht '4\' 10"'$  (1.473 m)   Wt 135 lb (61.2 kg)   BMI 28.22 kg/m  General:   Alert and oriented. Pleasant and cooperative. Well-nourished and well-developed.  Head:  Normocephalic and atraumatic. Eyes:  Without icterus, sclera clear and conjunctiva pink.  Ears:  Normal auditory acuity. Nose:  No deformity, discharge,  or lesions. Lungs:  Clear to auscultation bilaterally. No wheezes, rales, or rhonchi. No distress.  Heart:  S1, S2 present without murmurs appreciated.  Abdomen:  +BS, soft, non-tender and non-distended. No HSM noted. No guarding or rebound. No masses appreciated.  Rectal:  Deferred  Msk:  Symmetrical without gross deformities. Normal posture. Extremities:  Without edema. Neurologic:  Alert and  oriented x4 Psych:  Alert and cooperative. Normal mood and affect.

## 2017-02-19 NOTE — Assessment & Plan Note (Signed)
Continue Dexilant once daily. No alarm symptoms. Patient to call if any issues.

## 2017-02-19 NOTE — Assessment & Plan Note (Signed)
71 year old female with need for routine screening colonoscopy. No prior history of polyps, no FH of colon cancer. Last prep poor in 2007 as she became nauseated with the liquid. Will add an extra day of clear liquids in preparation for adequacy of prep. No concerning lower GI symptoms.  Proceed with TCS with Dr. Gala Romney in near future: the risks, benefits, and alternatives have been discussed with the patient in detail. The patient states understanding and desires to proceed. Phenergan 12.5 mg IV on call

## 2017-03-14 ENCOUNTER — Telehealth: Payer: Self-pay | Admitting: Nurse Practitioner

## 2017-03-14 NOTE — Telephone Encounter (Signed)
Patient said that her insurance is no longer covering dexlansoprazole (DEXILANT) 60 MG capsule and she wants to know if Dr. Nicki Reaper can switch her medication.  She only has 3 pills left.   [facility]

## 2017-03-16 NOTE — Telephone Encounter (Signed)
It is hard for [name] to know if other medications are covered?Protonix 40 mg is a good choice and also Nexium 40 mg is a good choice. More likely that Protonix is covered but the only way to know for certain is for her to call her insurance company and asked them. Then she can let us know when she finds out. If she would like [name] to we can go ahead and send in Protonix and it if it is not covered they will-the insurance company-we'll send [name] notification-I recommend 90 with one refill of either medicine she chooses. If she would like to have a short prescription such as a 2 week supply sent to her local pharmacy that can be done as well

## 2017-03-17 NOTE — Telephone Encounter (Signed)
Left message to return call 

## 2017-03-18 MED ORDER — PANTOPRAZOLE SODIUM 40 MG PO TBEC: 40.0000 mg | DELAYED_RELEASE_TABLET | Freq: Every day | ORAL | 1 refills | Status: DC

## 2017-03-18 NOTE — Telephone Encounter (Signed)
Patient would like a 30 day prescription of protonix sent to local pharmacy. Prescription sent electronically to pharmacy.

## 2017-03-27 ENCOUNTER — Encounter (HOSPITAL_COMMUNITY)
Admission: RE | Disposition: A | Discharge status: Home / Self Care | Payer: Self-pay | Source: Ambulatory Visit | Attending: Internal Medicine

## 2017-03-27 ENCOUNTER — Ambulatory Visit (HOSPITAL_COMMUNITY)
Admission: RE | Admit: 2017-03-27 | Discharge: 2017-03-27 | Disposition: A | Discharge status: Home / Self Care | Payer: BLUE CROSS/BLUE SHIELD | Source: Ambulatory Visit | Attending: Internal Medicine | Admitting: Internal Medicine

## 2017-03-27 ENCOUNTER — Encounter (HOSPITAL_COMMUNITY): Payer: Self-pay | Admitting: *Deleted

## 2017-03-27 DIAGNOSIS — M8580 Other specified disorders of bone density and structure, unspecified site: Secondary | ICD-10-CM | POA: Diagnosis not present

## 2017-03-27 DIAGNOSIS — K644 Residual hemorrhoidal skin tags: Secondary | ICD-10-CM | POA: Insufficient documentation

## 2017-03-27 DIAGNOSIS — F329 Major depressive disorder, single episode, unspecified: Secondary | ICD-10-CM | POA: Insufficient documentation

## 2017-03-27 DIAGNOSIS — E7800 Pure hypercholesterolemia, unspecified: Secondary | ICD-10-CM | POA: Insufficient documentation

## 2017-03-27 DIAGNOSIS — K219 Gastro-esophageal reflux disease without esophagitis: Secondary | ICD-10-CM | POA: Insufficient documentation

## 2017-03-27 DIAGNOSIS — Z79899 Other long term (current) drug therapy: Secondary | ICD-10-CM | POA: Insufficient documentation

## 2017-03-27 DIAGNOSIS — Z87891 Personal history of nicotine dependence: Secondary | ICD-10-CM | POA: Insufficient documentation

## 2017-03-27 DIAGNOSIS — Z1212 Encounter for screening for malignant neoplasm of rectum: Secondary | ICD-10-CM | POA: Diagnosis not present

## 2017-03-27 DIAGNOSIS — Z1211 Encounter for screening for malignant neoplasm of colon: Secondary | ICD-10-CM | POA: Diagnosis not present

## 2017-03-27 DIAGNOSIS — K6389 Other specified diseases of intestine: Secondary | ICD-10-CM | POA: Diagnosis not present

## 2017-03-27 HISTORY — PX: COLONOSCOPY: SHX5424

## 2017-03-27 SURGERY — COLONOSCOPY
Anesthesia: Moderate Sedation

## 2017-03-27 MED ORDER — PROMETHAZINE HCL 25 MG/ML IJ SOLN
12.5000 mg | Freq: Once | INTRAMUSCULAR | Status: AC
  Administered 2017-03-27: 12.5 mg via INTRAVENOUS

## 2017-03-27 MED ORDER — MIDAZOLAM HCL 5 MG/5ML IJ SOLN
INTRAMUSCULAR | Status: DC | PRN
  Administered 2017-03-27: 2 mg via INTRAVENOUS

## 2017-03-27 MED ORDER — ONDANSETRON HCL 4 MG/2ML IJ SOLN
INTRAMUSCULAR | Status: AC
  Filled 2017-03-27: qty 2

## 2017-03-27 MED ORDER — MEPERIDINE HCL 100 MG/ML IJ SOLN
INTRAMUSCULAR | Status: AC
  Filled 2017-03-27: qty 2

## 2017-03-27 MED ORDER — SODIUM CHLORIDE 0.9% FLUSH
INTRAVENOUS | Status: AC
  Filled 2017-03-27: qty 10

## 2017-03-27 MED ORDER — PROMETHAZINE HCL 25 MG/ML IJ SOLN
INTRAMUSCULAR | Status: AC
  Filled 2017-03-27: qty 1

## 2017-03-27 MED ORDER — MEPERIDINE HCL 100 MG/ML IJ SOLN
INTRAMUSCULAR | Status: DC | PRN
  Administered 2017-03-27: 50 mg via INTRAVENOUS

## 2017-03-27 MED ORDER — SODIUM CHLORIDE 0.9 % IV SOLN
INTRAVENOUS | Status: DC
  Administered 2017-03-27: 08:00:00 via INTRAVENOUS

## 2017-03-27 MED ORDER — STERILE WATER FOR IRRIGATION IR SOLN
Status: DC | PRN
  Administered 2017-03-27: 2.5 mL

## 2017-03-27 MED ORDER — ONDANSETRON HCL 4 MG/2ML IJ SOLN
INTRAMUSCULAR | Status: DC | PRN
  Administered 2017-03-27: 4 mg via INTRAVENOUS

## 2017-03-27 MED ORDER — MIDAZOLAM HCL 5 MG/5ML IJ SOLN
INTRAMUSCULAR | Status: AC
  Filled 2017-03-27: qty 10

## 2017-03-27 NOTE — H&P (Signed)
_0 @   Primary Care Physician:  Sallee Lange, MD Primary Gastroenterologist:  Dr. Gala Romney  Pre-Procedure History & Physical: HPI:  Patricia [name] is a 71 y.o. female is here for a screening colonoscopy. Negative colonoscopy (poor prep) 2007. No bowel symptoms currently. Here for average risk screening examination.  Past Medical History:  Diagnosis Date  . Depression   . GERD (gastroesophageal reflux disease)   . Hypercholesterolemia   . IFG (impaired fasting glucose)   . Osteopenia   . PONV (postoperative nausea and vomiting)     Past Surgical History:  Procedure Laterality Date  . ABDOMINAL HYSTERECTOMY    . CATARACT EXTRACTION W/PHACO  09/12/2011   Procedure: CATARACT EXTRACTION PHACO AND INTRAOCULAR LENS PLACEMENT (IOC);  Surgeon: Tonny Branch;  Location: [facility];  Service: Ophthalmology;  Laterality: Left;  CDE: 8.91  . CATARACT EXTRACTION W/PHACO  11/18/2011   Procedure: CATARACT EXTRACTION PHACO AND INTRAOCULAR LENS PLACEMENT (IOC);  Surgeon: Tonny Branch;  Location: [facility];  Service: Ophthalmology;  Laterality: Right;  CDE:10.26  . COLONOSCOPY  2007   Dr. Gala Romney: internal hemorrhoids, single anal papilla poor prep.   Marland Kitchen ECTOPIC PREGNANCY SURGERY     removal of right tube  . OVARIAN CYST REMOVAL     right  . RECTOCELE REPAIR N/A 01/18/2014   Procedure: POSTERIOR REPAIR (RECTOCELE);  Surgeon: Jonnie Kind, MD;  Location: [facility];  Service: Gynecology;  Laterality: N/A;  . VAGINAL HYSTERECTOMY N/A 01/18/2014   Procedure: HYSTERECTOMY VAGINAL;  Surgeon: Jonnie Kind, MD;  Location: [facility];  Service: Gynecology;  Laterality: N/A;    Prior to Admission medications   Medication Sig Start Date End Date Taking? Authorizing Provider  ALPRAZolam Duanne Moron) 0.5 MG tablet Take 1 tablet (0.5 mg total) by mouth at bedtime as needed for sleep. 04/29/16  Yes Nilda Simmer, NP  Calcium Carb-Cholecalciferol (CALCIUM 600 + D PO) Take 1 tablet by mouth at bedtime.    Yes Historical Provider,  MD  Multiple Vitamin (MULTIVITAMIN WITH MINERALS) TABS tablet Take 1 tablet by mouth daily.   Yes Historical Provider, MD  Na Sulfate-K Sulfate-Mg Sulf (SUPREP BOWEL PREP KIT) 17.5-3.13-1.6 GM/180ML SOLN Take 1 kit by mouth as directed. 02/19/17  Yes Daneil Dolin, MD  naproxen sodium (ALEVE) 220 MG tablet Take 220-440 mg by mouth daily as needed (pain).   Yes Historical Provider, MD  pantoprazole (PROTONIX) 40 MG tablet Take 1 tablet (40 mg total) by mouth daily. 03/18/17 03/18/18 Yes Nilda Simmer, NP  PARoxetine (PAXIL) 30 MG tablet TAKE 1 TABLET BY MOUTH  DAILY Patient taking differently: TAKE 1 TABLET BY MOUTH  DAILY AT BEDTIME 01/27/17  Yes [name], MD  pravastatin (PRAVACHOL) 40 MG tablet Take 1 tablet (40 mg total) by mouth at bedtime. 04/29/16  Yes Nilda Simmer, NP    Allergies as of 02/19/2017 - Review Complete 02/19/2017  Allergen Reaction Noted  . Codeine Nausea Only 09/11/2011    Family History  Problem Relation Age of Onset  . Heart attack Father   . Osteoporosis Mother   . Heart attack Brother 73    MI  . Breast cancer Maternal Aunt   . Breast cancer Paternal Aunt   . Cancer Paternal Aunt     ovarian cancer  . Anesthesia problems Neg Hx   . Hypotension Neg Hx   . Malignant hyperthermia Neg Hx   . Pseudochol deficiency Neg Hx   . Colon cancer Neg Hx  Social History   Social History  . Marital status: Married    Spouse name: N/A  . Number of children: N/A  . Years of education: N/A   Occupational History  . Not on file.   Social History Main Topics  . Smoking status: Former Smoker    Packs/day: 1.00    Years: 20.00    Types: Cigarettes    Quit date: 09/10/1984  . Smokeless tobacco: Never Used  . Alcohol use No  . Drug use: No  . Sexual activity: Not Currently    Birth control/ protection: Post-menopausal   Other Topics Concern  . Not on file   Social History Narrative  . No narrative on file    Review of Systems: See HPI,  otherwise negative ROS  Physical Exam: BP 140/74   Pulse (!) 53   Temp 98.3 F (36.8 C) (Oral)   Resp 20   Ht _0  (1.473 m)   Wt 135 lb (61.2 kg)   SpO2 96%   BMI 28.22 kg/m  General:   Alert,  Well-developed, well-nourished, pleasant and cooperative in NAD Lungs:  Clear throughout to auscultation.   No wheezes, crackles, or rhonchi. No acute distress. Heart:  Regular rate and rhythm; no murmurs, clicks, rubs,  or gallops. Abdomen:  Soft, nontender and nondistended. No masses, hepatosplenomegaly or hernias noted. Normal bowel sounds, without guarding, and without rebound.    Impression/Plan: Patricia [name] is now here to undergo a screening colonoscopy.  Average risk screening examination.  Risks, benefits, limitations, imponderables and alternatives regarding colonoscopy have been reviewed with the patient. Questions have been answered. All parties agreeable.     Notice:  This dictation was prepared with Dragon dictation along with smaller phrase technology. Any transcriptional errors that result from this process are unintentional and may not be corrected upon review.

## 2017-03-27 NOTE — Op Note (Signed)
[facility] Patient Name: Patricia [name] Procedure Date: 03/27/2017 7:07 AM MRN: [id number] Date of Birth: Jan 26, 1946 Attending MD: Patricia [name] , MD CSN: [id number] Age: 71 Admit Type: Outpatient Procedure:                Colonoscopy Indications:              Screening for colorectal malignant neoplasm Providers:                Patricia Richards, MD, Lurline Del, RN, Aram Candela Referring MD:              Medicines:                Midazolam 2 mg IV, Meperidine 50 mg IV,                            Promethazine 12.5 mg IV, Ondansetron 4 mg IV Complications:            No immediate complications. Estimated Blood Loss:     Estimated blood loss: none. Procedure:                Pre-Anesthesia Assessment:                           - Prior to the procedure, a History and Physical                            was performed, and patient medications and                            allergies were reviewed. The patient's tolerance of                            previous anesthesia was also reviewed. The risks                            and benefits of the procedure and the sedation                            options and risks were discussed with the patient.                            All questions were answered, and informed consent                            was obtained. Prior Anticoagulants: The patient has                            taken no previous anticoagulant or antiplatelet                            agents. ASA Grade Assessment: II - A patient with  mild systemic disease. After reviewing the risks                            and benefits, the patient was deemed in                            satisfactory condition to undergo the procedure.                           After obtaining informed consent, the colonoscope                            was passed under direct vision. Throughout the   procedure, the patient's blood pressure, pulse, and                            oxygen saturations were monitored continuously. The                            EC-[id number] ([id number]) scope was introduced through                            the anus and advanced to the the cecum, identified                            by appendiceal orifice and ileocecal valve. The                            ileocecal valve, appendiceal orifice, and rectum                            were photographed. The entire colon was well                            visualized. The colonoscopy was performed without                            difficulty. The patient tolerated the procedure                            well. The quality of the bowel preparation was                            adequate. The entire colon was well visualized. Scope In: 8:34:45 AM Scope Out: 8:48:33 AM Scope Withdrawal Time: 0 hours 7 minutes 18 seconds  Total Procedure Duration: 0 hours 13 minutes 48 seconds  Findings:      The perianal and digital rectal examinations were normal aside from       internal and external hemorrhoids..      The colon (entire examined portion) appeared normal aside from diffusely       pigmented colonic mucosa consistent with melanosis coli.      The exam was otherwise without abnormality on direct and retroflexion       views. Impression:               -  The entire examined colon is normal (hemorrhoids                            and melanosis coli)                           - The examination was otherwise normal on direct                            and retroflexion views.                           - No specimens collected. Moderate Sedation:      Moderate (conscious) sedation was administered by the endoscopy nurse       and supervised by the endoscopist. The following parameters were       monitored: oxygen saturation, heart rate, blood pressure, respiratory       rate, EKG, adequacy of pulmonary ventilation, and  response to care.       Total physician intraservice time was 16 minutes. Recommendation:           - Patient has a contact number available for                            emergencies. The signs and symptoms of potential                            delayed complications were discussed with the                            patient. Return to normal activities tomorrow.                            Written discharge instructions were provided to the                            patient.                           - Resume previous diet.                           - Continue present medications.                           - Return to GI office (date not yet determined). No                            future colonoscopy recommended unless new symptoms                            develop.                           - No repeat colonoscopy due to age. Procedure Code(s):        --- Professional ---  [phone], Colonoscopy, flexible; diagnostic, including                            collection of specimen(s) by brushing or washing,                            when performed (separate procedure)                           99152, Moderate sedation services provided by the                            same physician or other qualified health care                            professional performing the diagnostic or                            therapeutic service that the sedation supports,                            requiring the presence of an independent trained                            observer to assist in the monitoring of the                            patient's level of consciousness and physiological                            status; initial 15 minutes of intraservice time,                            patient age 65 years or older Diagnosis Code(s):        --- Professional ---                           Z12.11, Encounter for screening for malignant                            neoplasm of  colon CPT copyright 2016 [employer]. All rights reserved. The codes documented in this report are preliminary and upon coder review may  be revised to meet current compliance requirements. Patricia [name]. Patricia Cozza, MD Patricia Richards, MD 03/27/2017 9:00:05 AM This report has been signed electronically. Number of Addenda: 0

## 2017-03-27 NOTE — Discharge Instructions (Addendum)
Colonoscopy Discharge Instructions  Read the instructions outlined below and refer to this sheet in the next few weeks. These discharge instructions provide you with general information on caring for yourself after you leave the hospital. Your doctor may also give you specific instructions. While your treatment has been planned according to the most current medical practices available, unavoidable complications occasionally occur. If you have any problems or questions after discharge, call Dr. Gala Romney at [phone]. ACTIVITY  You may resume your regular activity, but move at a slower pace for the next 24 hours.   Take frequent rest periods for the next 24 hours.   Walking will help get rid of the air and reduce the bloated feeling in your belly (abdomen).   No driving for 24 hours (because of the medicine (anesthesia) used during the test).    Do not sign any important legal documents or operate any machinery for 24 hours (because of the anesthesia used during the test).  NUTRITION  Drink plenty of fluids.   You may resume your normal diet as instructed by your doctor.   Begin with a light meal and progress to your normal diet. Heavy or fried foods are harder to digest and may make you feel sick to your stomach (nauseated).   Avoid alcoholic beverages for 24 hours or as instructed.  MEDICATIONS  You may resume your normal medications unless your doctor tells you otherwise.  WHAT YOU CAN EXPECT TODAY  Some feelings of bloating in the abdomen.   Passage of more gas than usual.   Spotting of blood in your stool or on the toilet paper.  IF YOU HAD POLYPS REMOVED DURING THE COLONOSCOPY:  No aspirin products for 7 days or as instructed.   No alcohol for 7 days or as instructed.   Eat a soft diet for the next 24 hours.  FINDING OUT THE RESULTS OF YOUR TEST Not all test results are available during your visit. If your test results are not back during the visit, make an appointment  with your caregiver to find out the results. Do not assume everything is normal if you have not heard from your caregiver or the medical facility. It is important for you to follow up on all of your test results.  SEEK IMMEDIATE MEDICAL ATTENTION IF:  You have more than a spotting of blood in your stool.   Your belly is swollen (abdominal distention).   You are nauseated or vomiting.   You have a temperature over 101.   You have abdominal pain or discomfort that is severe or gets worse throughout the day.    Important information provided  I do not recommend a future colonoscopy unless new symptoms develop   Colonoscopy, Adult, Care After This sheet gives you information about how to care for yourself after your procedure. Your health care provider may also give you more specific instructions. If you have problems or questions, contact your health care provider. What can I expect after the procedure? After the procedure, it is common to have:  A small amount of blood in your stool for 24 hours after the procedure.  Some gas.  Mild abdominal cramping or bloating. Follow these instructions at home: General instructions    For the first 24 hours after the procedure:  Do not drive or use machinery.  Do not sign important documents.  Do not drink alcohol.  Do your regular daily activities at a slower pace than normal.  Eat soft, easy-to-digest foods.  Rest often.  Take over-the-counter or prescription medicines only as told by your health care provider.  It is up to you to get the results of your procedure. Ask your health care provider, or the department performing the procedure, when your results will be ready. Relieving cramping and bloating   Try walking around when you have cramps or feel bloated.  Apply heat to your abdomen as told by your health care provider. Use a heat source that your health care provider recommends, such as a moist heat pack or a heating  pad.  Place a towel between your skin and the heat source.  Leave the heat on for 20-30 minutes.  Remove the heat if your skin turns bright red. This is especially important if you are unable to feel pain, heat, or cold. You may have a greater risk of getting burned. Eating and drinking   Drink enough fluid to keep your urine clear or pale yellow.  Resume your normal diet as instructed by your health care provider. Avoid heavy or fried foods that are hard to digest.  Avoid drinking alcohol for as long as instructed by your health care provider. Contact a health care provider if:  You have blood in your stool 2-3 days after the procedure. Get help right away if:  You have more than a small spotting of blood in your stool.  You pass large blood clots in your stool.  Your abdomen is swollen.  You have nausea or vomiting.  You have a fever.  You have increasing abdominal pain that is not relieved with medicine. This information is not intended to replace advice given to you by your health care provider. Make sure you discuss any questions you have with your health care provider. Document Released: 07/09/2004 Document Revised: 08/19/2016 Document Reviewed: 02/06/2016 Elsevier Interactive Patient Education  2017 [employer].

## 2017-04-01 ENCOUNTER — Encounter (HOSPITAL_COMMUNITY): Payer: Self-pay | Admitting: Internal Medicine

## 2017-04-01 DIAGNOSIS — R7301 Impaired fasting glucose: Secondary | ICD-10-CM | POA: Diagnosis not present

## 2017-04-01 DIAGNOSIS — Z79899 Other long term (current) drug therapy: Secondary | ICD-10-CM | POA: Diagnosis not present

## 2017-04-01 DIAGNOSIS — E785 Hyperlipidemia, unspecified: Secondary | ICD-10-CM | POA: Diagnosis not present

## 2017-04-01 DIAGNOSIS — Z139 Encounter for screening, unspecified: Secondary | ICD-10-CM | POA: Diagnosis not present

## 2017-04-02 ENCOUNTER — Telehealth: Payer: Self-pay | Admitting: Family Medicine

## 2017-04-02 MED ORDER — PANTOPRAZOLE SODIUM 40 MG PO TBEC: 40.0000 mg | DELAYED_RELEASE_TABLET | Freq: Every day | ORAL | 1 refills | Status: DC

## 2017-04-02 NOTE — Telephone Encounter (Signed)
Left message on voicemail notifying patient refill was sent to pharmacy.

## 2017-04-02 NOTE — Telephone Encounter (Signed)
Requesting Rx for pantoprazole (PROTONIX) 40 MG tablet to [facility].

## 2017-04-10 DIAGNOSIS — E785 Hyperlipidemia, unspecified: Secondary | ICD-10-CM | POA: Diagnosis not present

## 2017-04-10 DIAGNOSIS — R7301 Impaired fasting glucose: Secondary | ICD-10-CM | POA: Diagnosis not present

## 2017-04-13 ENCOUNTER — Telehealth: Payer: Self-pay | Admitting: Family Medicine

## 2017-04-13 ENCOUNTER — Encounter: Payer: Self-pay | Admitting: Family Medicine

## 2017-04-13 NOTE — Telephone Encounter (Signed)
Her lab work overall looks good - LDL is elevated-C to the patient-she has a follow-up visit in July with Hoyle Sauer.

## 2017-04-22 DIAGNOSIS — Z713 Dietary counseling and surveillance: Secondary | ICD-10-CM | POA: Diagnosis not present

## 2017-04-22 DIAGNOSIS — E669 Obesity, unspecified: Secondary | ICD-10-CM | POA: Diagnosis not present

## 2017-04-22 DIAGNOSIS — E785 Hyperlipidemia, unspecified: Secondary | ICD-10-CM | POA: Diagnosis not present

## 2017-05-06 DIAGNOSIS — Z008 Encounter for other general examination: Secondary | ICD-10-CM | POA: Diagnosis not present

## 2017-05-06 DIAGNOSIS — Z1389 Encounter for screening for other disorder: Secondary | ICD-10-CM | POA: Diagnosis not present

## 2017-05-06 DIAGNOSIS — R7301 Impaired fasting glucose: Secondary | ICD-10-CM | POA: Diagnosis not present

## 2017-05-06 DIAGNOSIS — Z719 Counseling, unspecified: Secondary | ICD-10-CM | POA: Diagnosis not present

## 2017-05-06 DIAGNOSIS — E785 Hyperlipidemia, unspecified: Secondary | ICD-10-CM | POA: Diagnosis not present

## 2017-05-21 DIAGNOSIS — Z1231 Encounter for screening mammogram for malignant neoplasm of breast: Secondary | ICD-10-CM | POA: Diagnosis not present

## 2017-06-17 DIAGNOSIS — Z79899 Other long term (current) drug therapy: Secondary | ICD-10-CM | POA: Diagnosis not present

## 2017-06-17 DIAGNOSIS — E785 Hyperlipidemia, unspecified: Secondary | ICD-10-CM | POA: Diagnosis not present

## 2017-06-18 ENCOUNTER — Ambulatory Visit: Payer: BLUE CROSS/BLUE SHIELD | Admitting: Nurse Practitioner

## 2017-07-03 DIAGNOSIS — E785 Hyperlipidemia, unspecified: Secondary | ICD-10-CM | POA: Diagnosis not present

## 2017-07-13 ENCOUNTER — Other Ambulatory Visit: Payer: Self-pay | Admitting: Family Medicine

## 2017-07-23 ENCOUNTER — Encounter: Payer: Self-pay | Admitting: Nurse Practitioner

## 2017-07-23 ENCOUNTER — Ambulatory Visit (INDEPENDENT_AMBULATORY_CARE_PROVIDER_SITE_OTHER): Payer: BLUE CROSS/BLUE SHIELD | Admitting: Nurse Practitioner

## 2017-07-23 VITALS — BP 122/78 | Ht <= 58 in | Wt 136.2 lb

## 2017-07-23 DIAGNOSIS — E785 Hyperlipidemia, unspecified: Secondary | ICD-10-CM | POA: Diagnosis not present

## 2017-07-23 DIAGNOSIS — F419 Anxiety disorder, unspecified: Secondary | ICD-10-CM | POA: Insufficient documentation

## 2017-07-23 DIAGNOSIS — K219 Gastro-esophageal reflux disease without esophagitis: Secondary | ICD-10-CM | POA: Diagnosis not present

## 2017-07-23 DIAGNOSIS — G4700 Insomnia, unspecified: Secondary | ICD-10-CM

## 2017-07-23 MED ORDER — PAROXETINE HCL 30 MG PO TABS: 30.0000 mg | ORAL_TABLET | Freq: Every day | ORAL | 1 refills | Status: DC

## 2017-07-23 MED ORDER — ALPRAZOLAM 0.5 MG PO TABS: 0.5000 mg | ORAL_TABLET | Freq: Every evening | ORAL | 5 refills | Status: DC | PRN

## 2017-07-23 NOTE — Progress Notes (Signed)
Subjective:  Presents for recheck on her anxiety/insomnia. Doing well on Paxil. GERD is stable if she takes Protonix daily. Did much better on Dexilant but too expensive. NP at her work recently switched her to [employer]. Gets her labs done there; has brought a copy today. No CP/ischemic type pain or SOB. No edema. Continues to work out on a regular basis.   Objective:   BP 122/78   Ht 4\' 10"  (1.473 m)   Wt 136 lb 3.2 oz (61.8 kg)   BMI 28.47 kg/m  NAD. Alert, oriented. Lungs clear. Heart RRR. Abdomen soft, non distended, non tender. LE: no edema.  Labs: LDL cholesterol went from 165 to 101.  Assessment:   Problem List Items Addressed This Visit      Digestive   GERD (gastroesophageal reflux disease) - Primary     Other   Anxiety   Relevant Medications   ALPRAZolam (XANAX) 0.5 MG tablet   PARoxetine (PAXIL) 30 MG tablet   Hyperlipemia (Chronic)   Relevant Medications   rosuvastatin (CRESTOR) 20 MG tablet   Insomnia      Plan:   Meds ordered this encounter  Medications  . rosuvastatin (CRESTOR) 20 MG tablet  . ALPRAZolam (XANAX) 0.5 MG tablet    Sig: Take 1 tablet (0.5 mg total) by mouth at bedtime as needed for sleep.    Dispense:  30 tablet    Refill:  [place]    Order Specific Question:   Supervising Provider    Answer:   Mikey Kirschner [[id number]]  . PARoxetine (PAXIL) 30 MG tablet    Sig: Take 1 tablet (30 mg total) by mouth at bedtime.    Dispense:  90 tablet    Refill:  1    Order Specific Question:   Supervising Provider    Answer:   Mikey Kirschner [[id number]]   Continue current meds as directed. Continue labs with work.  Return in about 6 months (around 01/23/2018) for physical.

## 2017-09-02 DIAGNOSIS — E785 Hyperlipidemia, unspecified: Secondary | ICD-10-CM | POA: Diagnosis not present

## 2017-09-02 DIAGNOSIS — Z713 Dietary counseling and surveillance: Secondary | ICD-10-CM | POA: Diagnosis not present

## 2017-09-02 DIAGNOSIS — E669 Obesity, unspecified: Secondary | ICD-10-CM | POA: Diagnosis not present

## 2017-09-11 DIAGNOSIS — Z23 Encounter for immunization: Secondary | ICD-10-CM | POA: Diagnosis not present

## 2017-09-30 DIAGNOSIS — Z7189 Other specified counseling: Secondary | ICD-10-CM | POA: Diagnosis not present

## 2017-09-30 DIAGNOSIS — R7301 Impaired fasting glucose: Secondary | ICD-10-CM | POA: Diagnosis not present

## 2017-09-30 DIAGNOSIS — Z719 Counseling, unspecified: Secondary | ICD-10-CM | POA: Diagnosis not present

## 2017-09-30 DIAGNOSIS — E785 Hyperlipidemia, unspecified: Secondary | ICD-10-CM | POA: Diagnosis not present

## 2017-09-30 DIAGNOSIS — Z008 Encounter for other general examination: Secondary | ICD-10-CM | POA: Diagnosis not present

## 2018-01-06 DIAGNOSIS — E669 Obesity, unspecified: Secondary | ICD-10-CM | POA: Diagnosis not present

## 2018-01-06 DIAGNOSIS — E785 Hyperlipidemia, unspecified: Secondary | ICD-10-CM | POA: Diagnosis not present

## 2018-01-06 DIAGNOSIS — Z713 Dietary counseling and surveillance: Secondary | ICD-10-CM | POA: Diagnosis not present

## 2018-01-26 ENCOUNTER — Ambulatory Visit (INDEPENDENT_AMBULATORY_CARE_PROVIDER_SITE_OTHER): Payer: BLUE CROSS/BLUE SHIELD | Admitting: Nurse Practitioner

## 2018-01-26 ENCOUNTER — Encounter: Payer: Self-pay | Admitting: Nurse Practitioner

## 2018-01-26 VITALS — BP 122/84 | Ht <= 58 in | Wt 135.0 lb

## 2018-01-26 DIAGNOSIS — Z780 Asymptomatic menopausal state: Secondary | ICD-10-CM | POA: Diagnosis not present

## 2018-01-26 DIAGNOSIS — Z01419 Encounter for gynecological examination (general) (routine) without abnormal findings: Secondary | ICD-10-CM

## 2018-01-26 NOTE — Progress Notes (Signed)
   Subjective:    Patient ID: Patricia [name], female    DOB: 1945/12/26, 72 y.o.   MRN: [id number]  HPI  Presents for her wellness exam. No pelvic pain. No new sexual partners. Taking Protonix daily, sometimes BID. Has not been able to stop medication. Takes daily MVI. Regular vision and dental exams. Very active lifestyle. Takes daily vitamin D and calcium.     Review of Systems  Constitutional: Negative for activity change, appetite change and fatigue.  HENT: Negative for dental problem, ear pain, sinus pressure and sore throat.   Respiratory: Negative for cough, chest tightness, shortness of breath and wheezing.   Cardiovascular: Negative for chest pain.  Gastrointestinal: Negative for abdominal distention, abdominal pain, constipation, diarrhea, nausea and vomiting.  Genitourinary: Negative for difficulty urinating, dysuria, enuresis, frequency, genital sores, pelvic pain, urgency and vaginal discharge.   Depression screen [facility] 2/9 01/26/2018 12/20/2016  Decreased Interest 0 0  Down, Depressed, Hopeless 0 0  PHQ - 2 Score 0 0        Objective:   Physical Exam  Constitutional: She is oriented to person, place, and time. She appears well-developed. No distress.  HENT:  Right Ear: External ear normal.  Left Ear: External ear normal.  Mouth/Throat: Oropharynx is clear and moist.  Neck: Normal range of motion. Neck supple. No tracheal deviation present. No thyromegaly present.  Cardiovascular: Normal rate, regular rhythm and normal heart sounds. Exam reveals no gallop.  No murmur heard. Pulmonary/Chest: Effort normal and breath sounds normal. Right breast exhibits no inverted nipple, no mass, no skin change and no tenderness. Left breast exhibits no inverted nipple, no mass, no skin change and no tenderness. Breasts are symmetrical.  Axilla no adenopathy.  Abdominal: Soft. She exhibits no distension. There is no tenderness.  Genitourinary: Vagina normal. No vaginal discharge found.    Genitourinary Comments: External GU no rashes or lesions.  Vagina no discharge.  Bimanual exam no tenderness or obvious masses.  Vaginal prolapse noted.  Musculoskeletal: She exhibits no edema.  Lymphadenopathy:    She has no cervical adenopathy.  Neurological: She is alert and oriented to person, place, and time.  Skin: Skin is warm and dry. No rash noted.  Psychiatric: She has a normal mood and affect. Her behavior is normal.  Vitals reviewed. See scanned lab work dated 09/17/2017 which shows LDL cholesterol 65.        Assessment & Plan:  Well woman exam - Plan: CANCELED: DG Bone Density  Postmenopausal - Plan: DG Bone Density  Continue activity and daily multivitamin.  Continue current medications as directed. Return in about 6 months (around 07/26/2018) for routine follow up.

## 2018-01-28 ENCOUNTER — Ambulatory Visit (HOSPITAL_COMMUNITY)
Admission: RE | Admit: 2018-01-28 | Discharge: 2018-01-28 | Disposition: A | Discharge status: Home / Self Care | Payer: BLUE CROSS/BLUE SHIELD | Source: Ambulatory Visit | Attending: Nurse Practitioner | Admitting: Nurse Practitioner

## 2018-01-28 DIAGNOSIS — M8580 Other specified disorders of bone density and structure, unspecified site: Secondary | ICD-10-CM | POA: Diagnosis not present

## 2018-01-28 DIAGNOSIS — Z13820 Encounter for screening for osteoporosis: Secondary | ICD-10-CM | POA: Diagnosis not present

## 2018-01-28 DIAGNOSIS — M8589 Other specified disorders of bone density and structure, multiple sites: Secondary | ICD-10-CM | POA: Diagnosis not present

## 2018-01-28 DIAGNOSIS — Z780 Asymptomatic menopausal state: Secondary | ICD-10-CM | POA: Diagnosis not present

## 2018-02-20 ENCOUNTER — Other Ambulatory Visit: Payer: Self-pay | Admitting: Nurse Practitioner

## 2018-02-20 ENCOUNTER — Other Ambulatory Visit: Payer: Self-pay | Admitting: *Deleted

## 2018-02-20 MED ORDER — ROSUVASTATIN CALCIUM 20 MG PO TABS: 20.0000 mg | ORAL_TABLET | Freq: Every day | ORAL | 1 refills | Status: DC

## 2018-02-20 MED ORDER — PANTOPRAZOLE SODIUM 40 MG PO TBEC: 40.0000 mg | DELAYED_RELEASE_TABLET | Freq: Every day | ORAL | 1 refills | Status: DC

## 2018-02-20 NOTE — Telephone Encounter (Signed)
May have 30-day prescription, send green card for office visit needed

## 2018-03-02 ENCOUNTER — Telehealth: Payer: Self-pay

## 2018-03-02 ENCOUNTER — Other Ambulatory Visit: Payer: Self-pay | Admitting: Nurse Practitioner

## 2018-03-02 ENCOUNTER — Telehealth: Payer: Self-pay | Admitting: Nurse Practitioner

## 2018-03-02 NOTE — Telephone Encounter (Signed)
Which pharmacy? Xanax local?

## 2018-03-02 NOTE — Telephone Encounter (Signed)
Patient is aware of the results of the bone density scan

## 2018-03-02 NOTE — Telephone Encounter (Signed)
Pt was seen by Hoyle Sauer on Jan 26 2018 for her wellness visit. Pt is needing 90 day refills on her medications as well as a refill on her xanax. Pt was told that she did not need to come back for 6 months by Hoyle Sauer. Please advise.

## 2018-03-03 DIAGNOSIS — Z79899 Other long term (current) drug therapy: Secondary | ICD-10-CM | POA: Diagnosis not present

## 2018-03-03 DIAGNOSIS — E785 Hyperlipidemia, unspecified: Secondary | ICD-10-CM | POA: Diagnosis not present

## 2018-03-03 DIAGNOSIS — Z139 Encounter for screening, unspecified: Secondary | ICD-10-CM | POA: Diagnosis not present

## 2018-03-03 DIAGNOSIS — R7301 Impaired fasting glucose: Secondary | ICD-10-CM | POA: Diagnosis not present

## 2018-03-03 NOTE — Telephone Encounter (Signed)
Tried to call again. No answer.

## 2018-03-03 NOTE — Telephone Encounter (Signed)
Tried calling vm not set up. ?

## 2018-03-04 NOTE — Telephone Encounter (Signed)
Tried calling again no answer.

## 2018-03-08 ENCOUNTER — Encounter: Payer: Self-pay | Admitting: Family Medicine

## 2018-03-08 ENCOUNTER — Telehealth: Payer: Self-pay | Admitting: Family Medicine

## 2018-03-08 NOTE — Telephone Encounter (Signed)
Patient was tried multiple times to notify of results of bone density patient did not respond letter sent to her does show osteopenia follow-up bone density 2 years

## 2018-03-11 NOTE — Telephone Encounter (Signed)
Left message to return call 

## 2018-03-16 NOTE — Telephone Encounter (Signed)
Tried calling no answer. I mailed a green card asking her to r/c.

## 2018-03-17 DIAGNOSIS — E785 Hyperlipidemia, unspecified: Secondary | ICD-10-CM | POA: Diagnosis not present

## 2018-03-17 DIAGNOSIS — Z719 Counseling, unspecified: Secondary | ICD-10-CM | POA: Diagnosis not present

## 2018-03-17 DIAGNOSIS — Z1389 Encounter for screening for other disorder: Secondary | ICD-10-CM | POA: Diagnosis not present

## 2018-03-17 DIAGNOSIS — Z008 Encounter for other general examination: Secondary | ICD-10-CM | POA: Diagnosis not present

## 2018-03-17 DIAGNOSIS — R7301 Impaired fasting glucose: Secondary | ICD-10-CM | POA: Diagnosis not present

## 2018-03-20 ENCOUNTER — Other Ambulatory Visit: Payer: Self-pay | Admitting: Nurse Practitioner

## 2018-03-20 MED ORDER — PANTOPRAZOLE SODIUM 40 MG PO TBEC: 40.0000 mg | DELAYED_RELEASE_TABLET | Freq: Every day | ORAL | 1 refills | Status: DC

## 2018-03-20 MED ORDER — ALPRAZOLAM 0.5 MG PO TABS: 0.5000 mg | ORAL_TABLET | Freq: Every evening | ORAL | 5 refills | Status: DC | PRN

## 2018-03-20 MED ORDER — PAROXETINE HCL 30 MG PO TABS: 30.0000 mg | ORAL_TABLET | Freq: Every day | ORAL | 1 refills | Status: DC

## 2018-03-20 MED ORDER — ROSUVASTATIN CALCIUM 20 MG PO TABS: 20.0000 mg | ORAL_TABLET | Freq: Every day | ORAL | 1 refills | Status: DC

## 2018-03-20 NOTE — Telephone Encounter (Signed)
All meds including Xanax were went to [facility] mail order per her request.

## 2018-03-20 NOTE — Telephone Encounter (Signed)
[facility] [employer]

## 2018-04-14 DIAGNOSIS — Z713 Dietary counseling and surveillance: Secondary | ICD-10-CM | POA: Diagnosis not present

## 2018-04-14 DIAGNOSIS — E785 Hyperlipidemia, unspecified: Secondary | ICD-10-CM | POA: Diagnosis not present

## 2018-04-14 DIAGNOSIS — E669 Obesity, unspecified: Secondary | ICD-10-CM | POA: Diagnosis not present

## 2018-05-20 DIAGNOSIS — Z1231 Encounter for screening mammogram for malignant neoplasm of breast: Secondary | ICD-10-CM | POA: Diagnosis not present

## 2018-05-28 ENCOUNTER — Telehealth: Payer: Self-pay | Admitting: Family Medicine

## 2018-05-28 NOTE — Telephone Encounter (Signed)
Review results to screening mammogram in results folder.

## 2018-05-29 NOTE — Telephone Encounter (Signed)
Mammogram negative patient was being notified by the mammogram center

## 2018-07-17 ENCOUNTER — Ambulatory Visit: Payer: BLUE CROSS/BLUE SHIELD | Admitting: Family Medicine

## 2018-07-21 ENCOUNTER — Ambulatory Visit: Payer: BLUE CROSS/BLUE SHIELD | Admitting: Family Medicine

## 2018-07-31 ENCOUNTER — Ambulatory Visit: Payer: BLUE CROSS/BLUE SHIELD | Admitting: Family Medicine

## 2018-08-11 ENCOUNTER — Telehealth: Payer: Self-pay

## 2018-08-11 ENCOUNTER — Other Ambulatory Visit: Payer: Self-pay

## 2018-08-12 ENCOUNTER — Other Ambulatory Visit: Payer: Self-pay | Admitting: *Deleted

## 2018-08-12 MED ORDER — PAROXETINE HCL 30 MG PO TABS: 30.0000 mg | ORAL_TABLET | Freq: Every day | ORAL | 0 refills | Status: DC

## 2018-08-12 NOTE — Telephone Encounter (Signed)
This is a duplicate I just did this

## 2018-08-12 NOTE — Telephone Encounter (Signed)
She may have 1 refill she needs a follow-up visit this fall

## 2018-09-01 DIAGNOSIS — R7301 Impaired fasting glucose: Secondary | ICD-10-CM | POA: Diagnosis not present

## 2018-09-01 DIAGNOSIS — Z79899 Other long term (current) drug therapy: Secondary | ICD-10-CM | POA: Diagnosis not present

## 2018-09-01 DIAGNOSIS — Z0130 Encounter for examination of blood pressure without abnormal findings: Secondary | ICD-10-CM | POA: Diagnosis not present

## 2018-09-01 DIAGNOSIS — Z139 Encounter for screening, unspecified: Secondary | ICD-10-CM | POA: Diagnosis not present

## 2018-09-04 DIAGNOSIS — Z23 Encounter for immunization: Secondary | ICD-10-CM | POA: Diagnosis not present

## 2018-09-08 DIAGNOSIS — R7301 Impaired fasting glucose: Secondary | ICD-10-CM | POA: Diagnosis not present

## 2018-09-08 DIAGNOSIS — E785 Hyperlipidemia, unspecified: Secondary | ICD-10-CM | POA: Diagnosis not present

## 2018-09-15 ENCOUNTER — Ambulatory Visit (INDEPENDENT_AMBULATORY_CARE_PROVIDER_SITE_OTHER): Payer: BLUE CROSS/BLUE SHIELD | Admitting: Family Medicine

## 2018-09-15 ENCOUNTER — Encounter: Payer: Self-pay | Admitting: Family Medicine

## 2018-09-15 VITALS — BP 134/72 | Ht <= 58 in | Wt 136.0 lb

## 2018-09-15 DIAGNOSIS — F411 Generalized anxiety disorder: Secondary | ICD-10-CM | POA: Diagnosis not present

## 2018-09-15 DIAGNOSIS — E785 Hyperlipidemia, unspecified: Secondary | ICD-10-CM | POA: Diagnosis not present

## 2018-09-15 DIAGNOSIS — G4700 Insomnia, unspecified: Secondary | ICD-10-CM | POA: Diagnosis not present

## 2018-09-15 MED ORDER — PANTOPRAZOLE SODIUM 40 MG PO TBEC: 40.0000 mg | DELAYED_RELEASE_TABLET | Freq: Every day | ORAL | 1 refills | Status: DC

## 2018-09-15 MED ORDER — ROSUVASTATIN CALCIUM 20 MG PO TABS: 20.0000 mg | ORAL_TABLET | Freq: Every day | ORAL | 1 refills | Status: DC

## 2018-09-15 MED ORDER — PAROXETINE HCL 30 MG PO TABS: 30.0000 mg | ORAL_TABLET | Freq: Every day | ORAL | 1 refills | Status: DC

## 2018-09-15 MED ORDER — ALPRAZOLAM 0.5 MG PO TABS: 0.5000 mg | ORAL_TABLET | Freq: Every evening | ORAL | 5 refills | Status: DC | PRN

## 2018-09-15 NOTE — Patient Instructions (Signed)

## 2018-09-15 NOTE — Progress Notes (Signed)
   Subjective:    Patient ID: Patricia [name], female    DOB: 1946/10/21, 72 y.o.   MRN: [id number]  Hyperlipidemia  This is a chronic problem. The current episode started more than 1 year ago. Pertinent negatives include no chest pain or shortness of breath. There are no compliance problems (takes meds every day, works out 4 days a week, eats healthy).   Extremely nice patient Not having any significant issues Eating healthy exercising on a regular basis  Pt states no concerns today.   Patient relates she takes her Xanax at nighttime helps her rest and uses it intermittently denies problem  Patient here for follow-up regarding cholesterol.  The patient does have hyperlipidemia.  Patient does try to maintain a reasonable diet.  Patient does take the medication on a regular basis.  Denies missing a dose.  The patient denies any obvious side effects.  Prior blood work results reviewed with the patient.  The patient is aware of his cholesterol goals and the need to keep it under good control to lessen the risk of disease.  Patient does take her Paxil for generalized anxiety disorder does well with this denies being depressed currently  Patient brings in lab work cholesterol looks good kidney function liver function look good thyroid looks good not anemic   Review of Systems  Constitutional: Negative for activity change, appetite change and fatigue.  HENT: Negative for congestion and rhinorrhea.   Respiratory: Negative for cough and shortness of breath.   Cardiovascular: Negative for chest pain and leg swelling.  Gastrointestinal: Negative for abdominal pain and diarrhea.  Endocrine: Negative for polydipsia and polyphagia.  Skin: Negative for color change.  Neurological: Negative for dizziness and weakness.  Psychiatric/Behavioral: Negative for behavioral problems and confusion.       Objective:   Physical Exam  Constitutional: She appears well-nourished. No distress.  HENT:  Head:  Normocephalic and atraumatic.  Eyes: Right eye exhibits no discharge. Left eye exhibits no discharge.  Neck: No tracheal deviation present.  Cardiovascular: Normal rate, regular rhythm and normal heart sounds.  No murmur heard. Pulmonary/Chest: Effort normal and breath sounds normal. No respiratory distress.  Musculoskeletal: She exhibits no edema.  Lymphadenopathy:    She has no cervical adenopathy.  Neurological: She is alert. Coordination normal.  Skin: Skin is warm and dry.  Psychiatric: She has a normal mood and affect. Her behavior is normal.  Vitals reviewed.         Assessment & Plan:  The patient was seen today as part of an evaluation regarding hyperlipidemia.  Recent lab work has been reviewed with the patient as well as the goals for good cholesterol care.  In addition to this medications have been discussed the importance of compliance with diet and medications discussed as well.  Finally the patient is aware that poor control of cholesterol, noncompliance can dramatically increase the risk of complications. The patient will keep regular office visits and the patient does agreed to periodic lab work.  Insomnia being treated well with Xanax at night denies abusing it  Generalized anxiety disorder continue current medication  Reflux decent control continue current medication  Follow-up 6 months sooner problems

## 2019-01-14 ENCOUNTER — Ambulatory Visit (INDEPENDENT_AMBULATORY_CARE_PROVIDER_SITE_OTHER): Payer: BLUE CROSS/BLUE SHIELD | Admitting: Family Medicine

## 2019-01-14 ENCOUNTER — Encounter: Payer: Self-pay | Admitting: Family Medicine

## 2019-01-14 VITALS — BP 132/88 | Temp 98.9°F | Ht <= 58 in | Wt 138.8 lb

## 2019-01-14 DIAGNOSIS — J111 Influenza due to unidentified influenza virus with other respiratory manifestations: Secondary | ICD-10-CM

## 2019-01-14 NOTE — Patient Instructions (Signed)

## 2019-01-14 NOTE — Progress Notes (Signed)
   Subjective:    Patient ID: Patricia [name], female    DOB: 1946-05-10, 73 y.o.   MRN: [id number]  Sore Throat   This is a new problem. Episode onset: 2 days. The maximum temperature recorded prior to her arrival was 101 - 101.9 F. Associated symptoms include coughing, ear pain and headaches. Pertinent negatives include no diarrhea, shortness of breath or vomiting. Associated symptoms comments: Ear pain, fever. Treatments tried: aleve.   Monday started with headache, sore throat. Fever initially Monday, Tuesday, and Wednesday - 101 was highest on Monday.  Temp this morning was 100, took 2 aleve this morning. Cough non productive. Bilateral ear pain - states feels like her head is "burning."   Has tried aleve for this. Has also tried some alka seltzer cold medicine somewhat helpful.    Review of Systems  Constitutional: Positive for chills and fever.  HENT: Positive for ear pain and sore throat.   Respiratory: Positive for cough. Negative for shortness of breath and wheezing.   Gastrointestinal: Negative for diarrhea, nausea and vomiting.  Neurological: Positive for headaches.       Objective:   Physical Exam Vitals signs and nursing note reviewed.  Constitutional:      General: She is not in acute distress.    Appearance: Normal appearance. She is not toxic-appearing.  HENT:     Head: Normocephalic and atraumatic.     Right Ear: Tympanic membrane normal.     Left Ear: Tympanic membrane normal.     Nose: Nose normal.     Mouth/Throat:     Mouth: Mucous membranes are moist.     Pharynx: Posterior oropharyngeal erythema (mild) present.  Eyes:     General:        Right eye: No discharge.        Left eye: No discharge.  Neck:     Musculoskeletal: Neck supple. No neck rigidity.  Cardiovascular:     Rate and Rhythm: Normal rate and regular rhythm.     Heart sounds: Normal heart sounds.  Pulmonary:     Effort: Pulmonary effort is normal. No respiratory distress.     Breath  sounds: Normal breath sounds. No wheezing or rales.  Lymphadenopathy:     Cervical: No cervical adenopathy.  Skin:    General: Skin is warm and dry.  Neurological:     Mental Status: She is alert and oriented to person, place, and time.           Assessment & Plan:  Influenza  Discussed with patient likely flu or flu-like illness based on her symptoms and exam today. Symptomatic care discussed. Warning signs discussed. If no improvement or worsening over the next several days she should f/u with [name], if severe or over the weekend she should f/u in urgent care or ED.

## 2019-01-15 ENCOUNTER — Ambulatory Visit: Payer: BLUE CROSS/BLUE SHIELD | Admitting: Family Medicine

## 2019-01-19 DIAGNOSIS — E785 Hyperlipidemia, unspecified: Secondary | ICD-10-CM | POA: Diagnosis not present

## 2019-01-19 DIAGNOSIS — E669 Obesity, unspecified: Secondary | ICD-10-CM | POA: Diagnosis not present

## 2019-01-19 DIAGNOSIS — Z713 Dietary counseling and surveillance: Secondary | ICD-10-CM | POA: Diagnosis not present

## 2019-01-28 ENCOUNTER — Other Ambulatory Visit: Payer: Self-pay | Admitting: Family Medicine

## 2019-02-23 ENCOUNTER — Encounter: Payer: BLUE CROSS/BLUE SHIELD | Admitting: Family Medicine

## 2019-02-25 ENCOUNTER — Encounter: Payer: BLUE CROSS/BLUE SHIELD | Admitting: Family Medicine

## 2019-03-16 DIAGNOSIS — R7301 Impaired fasting glucose: Secondary | ICD-10-CM | POA: Diagnosis not present

## 2019-03-16 DIAGNOSIS — E785 Hyperlipidemia, unspecified: Secondary | ICD-10-CM | POA: Diagnosis not present

## 2019-04-19 ENCOUNTER — Other Ambulatory Visit: Payer: Self-pay | Admitting: Family Medicine

## 2019-04-20 NOTE — Telephone Encounter (Signed)
Left message to return call 

## 2019-04-20 NOTE — Telephone Encounter (Signed)
Schedule virtual visit Then may have refill

## 2019-04-26 NOTE — Telephone Encounter (Signed)
Patient transferred up front to set up appt.

## 2019-04-28 ENCOUNTER — Ambulatory Visit (INDEPENDENT_AMBULATORY_CARE_PROVIDER_SITE_OTHER): Payer: BLUE CROSS/BLUE SHIELD | Admitting: Family Medicine

## 2019-04-28 ENCOUNTER — Other Ambulatory Visit: Payer: Self-pay

## 2019-04-28 DIAGNOSIS — J301 Allergic rhinitis due to pollen: Secondary | ICD-10-CM | POA: Diagnosis not present

## 2019-04-28 DIAGNOSIS — E785 Hyperlipidemia, unspecified: Secondary | ICD-10-CM | POA: Diagnosis not present

## 2019-04-28 DIAGNOSIS — K219 Gastro-esophageal reflux disease without esophagitis: Secondary | ICD-10-CM | POA: Diagnosis not present

## 2019-04-28 MED ORDER — FLUTICASONE PROPIONATE 50 MCG/ACT NA SUSP: 2.0000 | Freq: Every day | NASAL | 1 refills | Status: DC

## 2019-04-28 NOTE — Progress Notes (Signed)
   Subjective:    Patient ID: Patricia [name], female    DOB: July 03, 1946, 73 y.o.   MRN: [id number] Video Hyperlipidemia  This is a chronic problem. The current episode started more than 1 year ago. Pertinent negatives include no chest pain or shortness of breath. Treatments tried: crestor 20 mg. There are no compliance problems.  Risk factors for coronary artery disease include dyslipidemia and post-menopausal.   Virtual Visit via Video Note  I connected with Patricia [name] on 04/28/19 at 10:00 AM EDT by a video enabled telemedicine application and verified that I am speaking with the correct person using two identifiers.  Location: Patient: Home Provider: Office   I discussed the limitations of evaluation and management by telemedicine and the availability of in person appointments. The patient expressed understanding and agreed to proceed.  History of Present Illness:    Observations/Objective:   Assessment and Plan:   Follow Up Instructions:    I discussed the assessment and treatment plan with the patient. The patient was provided an opportunity to ask questions and all were answered. The patient agreed with the plan and demonstrated an understanding of the instructions.   The patient was advised to call back or seek an in-person evaluation if the symptoms worsen or if the condition fails to improve as anticipated.  I provided 16 minutes of non-face-to-face time during this encounter.   Sallee Lange, MD   Patient having some allergies and inner ear Patient relates a lot of head congestion drainage coughing sneezing because that makes her feel dizzy at times she is tried some over-the-counter allergy tablets denies unilateral numbness weakness  Review of Systems  Constitutional: Negative for activity change, appetite change and fatigue.  HENT: Negative for congestion and rhinorrhea.   Respiratory: Negative for cough and shortness of breath.   Cardiovascular:  Negative for chest pain and leg swelling.  Gastrointestinal: Negative for abdominal pain and diarrhea.  Endocrine: Negative for polydipsia and polyphagia.  Skin: Negative for color change.  Neurological: Negative for dizziness and weakness.  Psychiatric/Behavioral: Negative for behavioral problems and confusion.       Objective:   Physical Exam   Patient had virtual visit Appears to be in no distress Atraumatic Neuro able to relate and oriented No apparent resp distress Color normal      Assessment & Plan:  The patient was seen today as part of an evaluation regarding hyperlipidemia.  Recent lab work has been reviewed with the patient as well as the goals for good cholesterol care.  In addition to this medications have been discussed the importance of compliance with diet and medications discussed as well.  Finally the patient is aware that poor control of cholesterol, noncompliance can dramatically increase the risk of complications. The patient will keep regular office visits and the patient does agreed to periodic lab work.  The patient was seen today for GERD. Patient benefits from medication. Patient to continue medication. Keep all regular follow ups.  Allergies may use Flonase on a regular basis continue OTC measures  Underlying generalized anxiety uses Paxil on a regular basis doing well with this continue Follow-up comprehensive check later this year

## 2019-07-01 ENCOUNTER — Encounter: Payer: Self-pay | Admitting: Family Medicine

## 2019-07-01 DIAGNOSIS — Z1231 Encounter for screening mammogram for malignant neoplasm of breast: Secondary | ICD-10-CM | POA: Diagnosis not present

## 2019-08-03 ENCOUNTER — Other Ambulatory Visit: Payer: Self-pay | Admitting: Family Medicine

## 2019-09-10 ENCOUNTER — Encounter: Payer: Self-pay | Admitting: Nurse Practitioner

## 2019-09-10 ENCOUNTER — Ambulatory Visit (INDEPENDENT_AMBULATORY_CARE_PROVIDER_SITE_OTHER): Payer: BC Managed Care – PPO | Admitting: Nurse Practitioner

## 2019-09-10 ENCOUNTER — Other Ambulatory Visit: Payer: Self-pay

## 2019-09-10 VITALS — BP 124/88 | Temp 96.3°F | Ht <= 58 in | Wt 140.6 lb

## 2019-09-10 DIAGNOSIS — Z23 Encounter for immunization: Secondary | ICD-10-CM

## 2019-09-10 DIAGNOSIS — N816 Rectocele: Secondary | ICD-10-CM

## 2019-09-10 DIAGNOSIS — F419 Anxiety disorder, unspecified: Secondary | ICD-10-CM

## 2019-09-10 DIAGNOSIS — Z0000 Encounter for general adult medical examination without abnormal findings: Secondary | ICD-10-CM

## 2019-09-10 DIAGNOSIS — R5383 Other fatigue: Secondary | ICD-10-CM | POA: Diagnosis not present

## 2019-09-10 DIAGNOSIS — N993 Prolapse of vaginal vault after hysterectomy: Secondary | ICD-10-CM | POA: Diagnosis not present

## 2019-09-10 MED ORDER — PAROXETINE HCL 40 MG PO TABS: 40.0000 mg | ORAL_TABLET | ORAL | 2 refills | Status: DC

## 2019-09-10 MED ORDER — ALPRAZOLAM 0.5 MG PO TABS: 0.5000 mg | ORAL_TABLET | Freq: Every evening | ORAL | 5 refills | Status: DC | PRN

## 2019-09-10 NOTE — Progress Notes (Signed)
   Subjective:    Patient ID: Patricia [name], female    DOB: 15-Feb-1946, 73 y.o.   MRN: [id number]  HPI The patient comes in today for a wellness visit.    A review of their health history was completed.  A review of medications was also completed.  Any needed refills; Alprazolam 0.5 mg  Eating habits: tries to be healthy  Falls/  MVA accidents in past few months: none  Regular exercise: yes  Specialist pt sees on regular basis: none  Preventative health issues were discussed.   Additional concerns: none   Review of Systems     Objective:   Physical Exam        Assessment & Plan:

## 2019-09-10 NOTE — Progress Notes (Signed)
Subjective:    Patient ID: Patricia [name], female    DOB: 1946-05-04, 73 y.o.   MRN: [id number]  HPI The patient comes in today for a wellness visit. Pt has no specific concerns. Pt regularly exercises and eats healthy. Labs are done through work, last one done in March was normal. Pt has decreased energy lately, feeling fatigued at times. Pt is not sexually active. Pt no longer receiving routine colonoscopy, last one normal. Last bone density scan was in 2019, revealed some osteopenia. Pt takes daily calcium and vitamin D. Pt takes protonix BID, unable to stop medication due to breakthrough reflux. No symptoms as long she takes her medication. Received mammogram in July, results normal. Pt received both Pneumovax 23 and Prevnar 13. Received Shingrix vaccine. Regular dental and eye exam.     A review of their health history was completed. A review of medications was also completed.  Any needed refills; Alprazolam 0.5 mg  Falls/  MVA accidents in past few months: none  Specialist pt sees on regular basis: none  Preventative health issues were discussed.   Additional concerns: none   Review of Systems  Constitutional: Negative for activity change, appetite change, chills and fever.  HENT: Negative for ear pain, sinus pain and sneezing.   Respiratory: Negative for cough, shortness of breath and wheezing.   Cardiovascular: Negative for chest pain, palpitations and leg swelling.  Gastrointestinal: Negative for abdominal pain, blood in stool, diarrhea and vomiting.       Strains with bowel movements at times   Genitourinary: Negative for difficulty urinating, dysuria, frequency, genital sores, pelvic pain, vaginal bleeding and vaginal discharge.       Slight leakage at times. Not constant.   Musculoskeletal: Negative for back pain and myalgias.  Neurological: Negative for dizziness, syncope, weakness and light-headedness.  Psychiatric/Behavioral: Negative for confusion and sleep  disturbance.   Depression screen [facility] 2/9 09/10/2019 09/15/2018 01/26/2018 12/20/2016  Decreased Interest 2 1 0 0  Down, Depressed, Hopeless 2 1 0 0  PHQ - 2 Score 4 2 0 0  Altered sleeping 1 1 - -  Tired, decreased energy 1 1 - -  Change in appetite 1 3 - -  Feeling bad or failure about yourself  2 2 - -  Trouble concentrating 0 2 - -  Moving slowly or fidgety/restless 0 1 - -  Suicidal thoughts 0 0 - -  PHQ-9 Score 9 12 - -  Difficult doing work/chores Somewhat difficult Very difficult - -       Objective:   Physical Exam Exam conducted with a chaperone present.  Constitutional:      Appearance: Normal appearance.  Neck:     Thyroid: No thyroid mass or thyromegaly.  Cardiovascular:     Rate and Rhythm: Normal rate and regular rhythm.     Heart sounds: No murmur.  Pulmonary:     Effort: No respiratory distress.     Breath sounds: Normal breath sounds. No stridor. No wheezing or rhonchi.  Chest:     Chest wall: No mass, lacerations, swelling or tenderness.     Breasts:        Right: Normal. No swelling, bleeding, inverted nipple or mass.        Left: Normal. No swelling, bleeding, inverted nipple or mass.  Abdominal:     General: Abdomen is flat.     Palpations: There is no mass.     Tenderness: There is no abdominal tenderness. There is no  rebound.     Hernia: No hernia is present.  Genitourinary:    Exam position: Lithotomy position.     Comments: External: Vaginal prolapse noted. No rashes or lesions present Internal: No rashes or lesions present. No CMT. Bimanual exam revealed rectocele. Nonpalpable ovaries. Exam limited due to abdominal girth. Lymphadenopathy:     Upper Body:     Right upper body: No supraclavicular, axillary or pectoral adenopathy.     Left upper body: No supraclavicular, axillary or pectoral adenopathy.  Skin:    General: Skin is warm and dry.  Neurological:     Mental Status: She is alert and oriented to person, place, and time.  Psychiatric:         Mood and Affect: Mood normal.        Behavior: Behavior normal.           Assessment & Plan:   Problem List Items Addressed This Visit      Digestive   Rectocele     Genitourinary   Prolapse of vaginal vault after hysterectomy     Other   Anxiety   Relevant Medications   ALPRAZolam (XANAX) 0.5 MG tablet   PARoxetine (PAXIL) 40 MG tablet    Other Visit Diagnoses    Routine general medical examination at a health care facility    -  Primary   Fatigue, unspecified type         Meds ordered this encounter  Medications  . ALPRAZolam (XANAX) 0.5 MG tablet    Sig: Take 1 tablet (0.5 mg total) by mouth at bedtime as needed for sleep.    Dispense:  30 tablet    Refill:  5    Order Specific Question:   Supervising Provider    Answer:   Sallee Lange A [[id number]]  . PARoxetine (PAXIL) 40 MG tablet    Sig: Take 1 tablet (40 mg total) by mouth every morning.    Dispense:  30 tablet    Refill:  2    Order Specific Question:   Supervising Provider    Answer:   Sallee Lange A [[id number]]     -Recommended continue dietary and lifestyle modifications. Provided information about Pessary for vaginal prolapse. Instructed pt to add TSH, CBC, and Vitamin D levels to labs that will be done at work due to fatigue. Advised pt to wean off the pantoprazole if possible due to increased risk of osteoporosis. Will follow-up as necessary.  Continue Xanax as directed. Increase Prozac to 40 mg. Contact office if any adverse effects or if no improvement. Continue follow up with her occupational health nurse.   Return in about 1 year (around 09/09/2020) for physical.

## 2019-09-10 NOTE — Patient Instructions (Addendum)
-  Please make sure you get a CBC, Vitamin D, and TSH drawn when you get lab at work due to increased fatigue -Pessary

## 2019-09-11 ENCOUNTER — Encounter: Payer: Self-pay | Admitting: Nurse Practitioner

## 2019-09-28 DIAGNOSIS — Z0130 Encounter for examination of blood pressure without abnormal findings: Secondary | ICD-10-CM | POA: Diagnosis not present

## 2019-09-28 DIAGNOSIS — E785 Hyperlipidemia, unspecified: Secondary | ICD-10-CM | POA: Diagnosis not present

## 2019-09-28 DIAGNOSIS — Z79899 Other long term (current) drug therapy: Secondary | ICD-10-CM | POA: Diagnosis not present

## 2019-09-28 DIAGNOSIS — R7301 Impaired fasting glucose: Secondary | ICD-10-CM | POA: Diagnosis not present

## 2019-10-12 DIAGNOSIS — R7301 Impaired fasting glucose: Secondary | ICD-10-CM | POA: Diagnosis not present

## 2019-10-12 DIAGNOSIS — Z008 Encounter for other general examination: Secondary | ICD-10-CM | POA: Diagnosis not present

## 2019-10-12 DIAGNOSIS — Z719 Counseling, unspecified: Secondary | ICD-10-CM | POA: Diagnosis not present

## 2019-10-12 DIAGNOSIS — E785 Hyperlipidemia, unspecified: Secondary | ICD-10-CM | POA: Diagnosis not present

## 2019-10-14 ENCOUNTER — Other Ambulatory Visit: Payer: Self-pay | Admitting: Family Medicine

## 2019-10-15 ENCOUNTER — Other Ambulatory Visit: Payer: Self-pay

## 2019-10-15 ENCOUNTER — Telehealth: Payer: Self-pay | Admitting: Family Medicine

## 2019-10-15 MED ORDER — ROSUVASTATIN CALCIUM 20 MG PO TABS: 20.0000 mg | ORAL_TABLET | Freq: Every day | ORAL | 0 refills | Status: DC

## 2019-10-15 NOTE — Telephone Encounter (Signed)
Medication sent to pharmacy and pt is aware 

## 2019-10-15 NOTE — Telephone Encounter (Signed)
rosuvastatin (CRESTOR) 20 MG tablet   Would like a refill sent to [facility]

## 2019-10-23 ENCOUNTER — Other Ambulatory Visit: Payer: Self-pay | Admitting: Family Medicine

## 2019-10-30 ENCOUNTER — Other Ambulatory Visit: Payer: Self-pay | Admitting: Family Medicine

## 2019-11-11 ENCOUNTER — Other Ambulatory Visit: Payer: Self-pay

## 2019-11-11 ENCOUNTER — Ambulatory Visit (INDEPENDENT_AMBULATORY_CARE_PROVIDER_SITE_OTHER): Payer: BC Managed Care – PPO | Admitting: Family Medicine

## 2019-11-11 ENCOUNTER — Encounter: Payer: Self-pay | Admitting: Family Medicine

## 2019-11-11 DIAGNOSIS — B349 Viral infection, unspecified: Secondary | ICD-10-CM | POA: Diagnosis not present

## 2019-11-11 DIAGNOSIS — J329 Chronic sinusitis, unspecified: Secondary | ICD-10-CM | POA: Diagnosis not present

## 2019-11-11 DIAGNOSIS — J310 Chronic rhinitis: Secondary | ICD-10-CM | POA: Diagnosis not present

## 2019-11-11 MED ORDER — CEFPROZIL 500 MG PO TABS: 500.0000 mg | ORAL_TABLET | Freq: Two times a day (BID) | ORAL | 0 refills | Status: DC

## 2019-11-11 NOTE — Addendum Note (Signed)
Addended by: Dairl Ponder on: 11/11/2019 02:52 PM   Modules accepted: Orders

## 2019-11-11 NOTE — Progress Notes (Signed)
   Subjective:    Patient ID: Patricia [name], female    DOB: 1946-02-28, 73 y.o.   MRN: [id number]  Cough This is a new problem. The current episode started in the past 7 days. Associated symptoms include nasal congestion.    Coughing up green mucus  Review of Systems  Respiratory: Positive for cough.    Virtual Visit via Video Note  I connected with Patricia [name] on 11/11/19 at  2:00 PM EST by a video enabled telemedicine application and verified that I am speaking with the correct person using two identifiers.  Location: Patient:  home Provider: office   I discussed the limitations of evaluation and management by telemedicine and the availability of in person appointments. The patient expressed understanding and agreed to proceed.  History of Present Illness:    Observations/Objective:   Assessment and Plan:   Follow Up Instructions:    I discussed the assessment and treatment plan with the patient. The patient was provided an opportunity to ask questions and all were answered. The patient agreed with the plan and demonstrated an understanding of the instructions.   The patient was advised to call back or seek an in-person evaluation if the symptoms worsen or if the condition fails to improve as anticipated.  I provided 52minutes of non-face-to-face time during this encounter.   Last wk  srted with cong   Coughing a lot  Pos prod cough, greenish phlegm  gdaughter cough and cong last week  Patient also notes husband over the past week and a half sad mild cough and cold    Objective:   Physical Exam   Virtual     Assessment & Plan:  Impression post viral rhinosinusitis/bronchitis.  Plan will cover with antibiotics symptom care discussed.  Covid testing due to widespread viral illness within the family warning signs discussed

## 2019-11-12 ENCOUNTER — Other Ambulatory Visit: Payer: Self-pay

## 2019-11-12 DIAGNOSIS — Z20822 Contact with and (suspected) exposure to covid-19: Secondary | ICD-10-CM

## 2019-11-12 DIAGNOSIS — Z20828 Contact with and (suspected) exposure to other viral communicable diseases: Secondary | ICD-10-CM | POA: Diagnosis not present

## 2019-11-13 DIAGNOSIS — I10 Essential (primary) hypertension: Secondary | ICD-10-CM | POA: Diagnosis not present

## 2019-11-13 DIAGNOSIS — R42 Dizziness and giddiness: Secondary | ICD-10-CM | POA: Diagnosis not present

## 2019-11-13 DIAGNOSIS — R5381 Other malaise: Secondary | ICD-10-CM | POA: Diagnosis not present

## 2019-11-13 DIAGNOSIS — R112 Nausea with vomiting, unspecified: Secondary | ICD-10-CM | POA: Diagnosis not present

## 2019-11-15 ENCOUNTER — Encounter: Payer: Self-pay | Admitting: Family Medicine

## 2019-11-15 ENCOUNTER — Ambulatory Visit (INDEPENDENT_AMBULATORY_CARE_PROVIDER_SITE_OTHER): Payer: BC Managed Care – PPO | Admitting: Family Medicine

## 2019-11-15 ENCOUNTER — Other Ambulatory Visit: Payer: Self-pay

## 2019-11-15 DIAGNOSIS — R42 Dizziness and giddiness: Secondary | ICD-10-CM | POA: Diagnosis not present

## 2019-11-15 MED ORDER — MECLIZINE HCL 25 MG PO TABS: ORAL_TABLET | ORAL | 0 refills | Status: DC

## 2019-11-15 MED ORDER — ONDANSETRON 4 MG PO TBDP: ORAL_TABLET | ORAL | 0 refills | Status: DC

## 2019-11-15 NOTE — Progress Notes (Signed)
   Subjective:  Audio alignment  Patient ID: Patricia [name], female    DOB: Mar 04, 1946, 73 y.o.   MRN: [id number]  HPI Pt is having vertigo this morning. Pt states she was really sick this weekend. Pt had to call ambulance. Pt was given a shot to help with dizziness. Pt states she is also having inner ear issues. Pt states she was unable to get out of bed this weekend.   Virtual Visit via Telephone Note  I connected with Patricia [name] on 11/15/19 at  9:30 AM EST by telephone and verified that I am speaking with the correct person using two identifiers.  Location: Patient: home Provider: office   I discussed the limitations, risks, security and privacy concerns of performing an evaluation and management service by telephone and the availability of in person appointments. I also discussed with the patient that there may be a patient responsible charge related to this service. The patient expressed understanding and agreed to proceed.   History of Present Illness:    Observations/Objective:   Assessment and Plan:   Follow Up Instructions:    I discussed the assessment and treatment plan with the patient. The patient was provided an opportunity to ask questions and all were answered. The patient agreed with the plan and demonstrated an understanding of the instructions.   The patient was advised to call back or seek an in-person evaluation if the symptoms worsen or if the condition fails to improve as anticipated.  I provided 16 minutes of non-face-to-face time during this encounter.  Has had vertigo in the past  Called ems  No fever  Cong overall better   Got a shot for sicknes Vicente Males, LPN  Review of Systems No headache no chest pain no shortness of breath congestion improving    Objective:   Physical Exam  Virtual      Assessment & Plan:  Impression rather classic vertigo on the heels of respiratory/sinus infection.  Antivert as needed for  dizziness.  Zofran as needed for nausea.  Warning signs discussed await COVID-19 results

## 2019-11-17 LAB — NOVEL CORONAVIRUS, NAA: SARS-CoV-2, NAA: DETECTED — AB

## 2019-11-18 ENCOUNTER — Telehealth: Payer: Self-pay | Admitting: *Deleted

## 2019-11-18 NOTE — Telephone Encounter (Signed)
Notify pt and disc precautions

## 2019-11-18 NOTE — Telephone Encounter (Signed)
Covid results came back positive

## 2019-11-18 NOTE — Telephone Encounter (Addendum)
Patient notified covid results were positive. Patient states she is feeling much better now and she will return call to the testing site. Patient notified about quarantine protocols and warning signs. Questions answered. Patient verbalized understanding.

## 2020-01-12 ENCOUNTER — Other Ambulatory Visit: Payer: Self-pay | Admitting: Family Medicine

## 2020-01-13 NOTE — Telephone Encounter (Signed)
May have 90-day with 1 refill follow-up in late spring

## 2020-02-24 ENCOUNTER — Telehealth: Payer: Self-pay | Admitting: Family Medicine

## 2020-02-24 ENCOUNTER — Other Ambulatory Visit: Payer: Self-pay | Admitting: *Deleted

## 2020-02-24 MED ORDER — ALPRAZOLAM 0.5 MG PO TABS: 0.5000 mg | ORAL_TABLET | Freq: Every evening | ORAL | 3 refills | Status: DC | PRN

## 2020-02-24 MED ORDER — PAROXETINE HCL 40 MG PO TABS: 40.0000 mg | ORAL_TABLET | ORAL | 1 refills | Status: DC

## 2020-02-24 NOTE — Telephone Encounter (Signed)
Left message to return call. [facility] was sent [facility]. Wanted to see if pt was ok with this or if we need to have doctor redo it.

## 2020-02-24 NOTE — Telephone Encounter (Signed)
Xanax please do a refill with 3 additional refills-may pend Other medication may send in 90 days with 1 refill Patient to do a follow-up visit by summer

## 2020-02-24 NOTE — Telephone Encounter (Signed)
Left message to return call 

## 2020-02-24 NOTE — Telephone Encounter (Signed)
Prescription sent in as requested 

## 2020-02-24 NOTE — Telephone Encounter (Signed)
Patient is requesting refills on xanax 0.5 mg called into [facility] and paroxetine 40 mg sent to [facility] order Please advise

## 2020-02-25 NOTE — Telephone Encounter (Signed)
Pt notified and she states she will pick up from [place] and does not need it switched

## 2020-06-12 ENCOUNTER — Telehealth: Payer: Self-pay | Admitting: Family Medicine

## 2020-06-12 NOTE — Telephone Encounter (Signed)
Nurses Let patient know we did receive her lab work, vitamin D level look good not anemic. She should follow-up this summer or early fall for standard follow-up visit (TIBC normal 22 WBC 6.3 hemoglobin 13.1 vitamin D 71)

## 2020-06-13 NOTE — Telephone Encounter (Signed)
Patient notified of results and verbalized understanding and will schedule follow up with Dr Nicki Reaper by early fall.

## 2020-06-22 ENCOUNTER — Other Ambulatory Visit: Payer: Self-pay | Admitting: Family Medicine

## 2020-06-23 NOTE — Telephone Encounter (Signed)
lvm to schedule appt.  

## 2020-06-23 NOTE — Telephone Encounter (Signed)
Patient is scheduled for 07/20/20.

## 2020-07-04 DIAGNOSIS — Z1231 Encounter for screening mammogram for malignant neoplasm of breast: Secondary | ICD-10-CM | POA: Diagnosis not present

## 2020-07-05 ENCOUNTER — Other Ambulatory Visit: Payer: Self-pay | Admitting: Family Medicine

## 2020-07-07 NOTE — Telephone Encounter (Signed)
90-day with 1 refill

## 2020-07-20 ENCOUNTER — Ambulatory Visit: Payer: BC Managed Care – PPO | Admitting: Family Medicine

## 2020-07-20 ENCOUNTER — Other Ambulatory Visit: Payer: Self-pay

## 2020-07-20 ENCOUNTER — Encounter: Payer: Self-pay | Admitting: Family Medicine

## 2020-07-20 VITALS — BP 126/76 | Temp 97.6°F | Ht <= 58 in | Wt 145.2 lb

## 2020-07-20 DIAGNOSIS — G4700 Insomnia, unspecified: Secondary | ICD-10-CM

## 2020-07-20 DIAGNOSIS — Z780 Asymptomatic menopausal state: Secondary | ICD-10-CM | POA: Diagnosis not present

## 2020-07-20 DIAGNOSIS — Z13820 Encounter for screening for osteoporosis: Secondary | ICD-10-CM | POA: Diagnosis not present

## 2020-07-20 DIAGNOSIS — M8580 Other specified disorders of bone density and structure, unspecified site: Secondary | ICD-10-CM

## 2020-07-20 DIAGNOSIS — E782 Mixed hyperlipidemia: Secondary | ICD-10-CM

## 2020-07-20 DIAGNOSIS — F419 Anxiety disorder, unspecified: Secondary | ICD-10-CM

## 2020-07-20 MED ORDER — ALPRAZOLAM 0.5 MG PO TABS: 0.5000 mg | ORAL_TABLET | Freq: Every evening | ORAL | 5 refills | Status: DC | PRN

## 2020-07-20 MED ORDER — ROSUVASTATIN CALCIUM 20 MG PO TABS: 20.0000 mg | ORAL_TABLET | Freq: Every day | ORAL | 1 refills | Status: DC

## 2020-07-20 NOTE — Progress Notes (Signed)
   Subjective:    Patient ID: Patricia [name], female    DOB: Jun 29, 1946, 74 y.o.   MRN: [id number]  Hypertension This is a chronic problem. The current episode started more than 1 year ago. Pertinent negatives include no chest pain or shortness of breath. Risk factors for coronary artery disease include dyslipidemia and post-menopausal state. Treatments tried: crestor. There are no compliance problems.    Patient relates that her stress levels are doing okay denies being depressed states medication is helping she would like to continue it  She is doing a good job taking her cholesterol medicine watching her diet and staying physically active  Has not had any significant reflux recently taking her medicine tolerating it well would like to continue it     Review of Systems  Constitutional: Negative for activity change, appetite change and fatigue.  HENT: Negative for congestion and rhinorrhea.   Respiratory: Negative for cough and shortness of breath.   Cardiovascular: Negative for chest pain and leg swelling.  Gastrointestinal: Negative for abdominal pain and diarrhea.  Endocrine: Negative for polydipsia and polyphagia.  Skin: Negative for color change.  Neurological: Negative for dizziness and weakness.  Psychiatric/Behavioral: Negative for behavioral problems and confusion.       Objective:   Physical Exam Vitals reviewed.  Constitutional:      General: She is not in acute distress. HENT:     Head: Normocephalic and atraumatic.  Eyes:     General:        Right eye: No discharge.        Left eye: No discharge.  Neck:     Trachea: No tracheal deviation.  Cardiovascular:     Rate and Rhythm: Normal rate and regular rhythm.     Heart sounds: Normal heart sounds. No murmur heard.   Pulmonary:     Effort: Pulmonary effort is normal. No respiratory distress.     Breath sounds: Normal breath sounds.  Lymphadenopathy:     Cervical: No cervical adenopathy.  Skin:     General: Skin is warm and dry.  Neurological:     Mental Status: She is alert.     Coordination: Coordination normal.  Psychiatric:        Behavior: Behavior normal.           Assessment & Plan:  1. Mixed hyperlipidemia Mild hyperlipidemia she states she gets her labs from her workplace she will let us know what it shows and send [name] a copy  2. Post-menopausal Bone density ordered - DG Bone Density  3. Screening for osteoporosis Bone density ordered - DG Bone Density  4. Insomnia, unspecified type Refills given  5. Anxiety Continue Paxil  6. Osteopenia, unspecified location Await bone density

## 2020-07-24 ENCOUNTER — Other Ambulatory Visit: Payer: Self-pay

## 2020-07-24 ENCOUNTER — Ambulatory Visit (HOSPITAL_COMMUNITY)
Admission: RE | Admit: 2020-07-24 | Discharge: 2020-07-24 | Disposition: A | Discharge status: Home / Self Care | Payer: BC Managed Care – PPO | Source: Ambulatory Visit | Attending: Family Medicine | Admitting: Family Medicine

## 2020-07-24 ENCOUNTER — Encounter: Payer: Self-pay | Admitting: Family Medicine

## 2020-07-24 DIAGNOSIS — Z90710 Acquired absence of both cervix and uterus: Secondary | ICD-10-CM | POA: Diagnosis not present

## 2020-07-24 DIAGNOSIS — M8589 Other specified disorders of bone density and structure, multiple sites: Secondary | ICD-10-CM | POA: Diagnosis not present

## 2020-07-24 DIAGNOSIS — Z780 Asymptomatic menopausal state: Secondary | ICD-10-CM | POA: Diagnosis not present

## 2020-07-24 DIAGNOSIS — Z13820 Encounter for screening for osteoporosis: Secondary | ICD-10-CM | POA: Insufficient documentation

## 2020-09-12 ENCOUNTER — Other Ambulatory Visit: Payer: Self-pay | Admitting: Family Medicine

## 2020-12-19 ENCOUNTER — Other Ambulatory Visit: Payer: Self-pay | Admitting: Family Medicine

## 2021-03-01 ENCOUNTER — Other Ambulatory Visit: Payer: Self-pay | Admitting: Family Medicine

## 2021-05-04 ENCOUNTER — Ambulatory Visit (INDEPENDENT_AMBULATORY_CARE_PROVIDER_SITE_OTHER): Payer: BC Managed Care – PPO | Admitting: Nurse Practitioner

## 2021-05-04 ENCOUNTER — Encounter: Payer: Self-pay | Admitting: Nurse Practitioner

## 2021-05-04 ENCOUNTER — Other Ambulatory Visit: Payer: Self-pay

## 2021-05-04 VITALS — BP 138/84 | HR 69 | Temp 96.4°F | Wt 144.0 lb

## 2021-05-04 DIAGNOSIS — K219 Gastro-esophageal reflux disease without esophagitis: Secondary | ICD-10-CM | POA: Diagnosis not present

## 2021-05-04 DIAGNOSIS — T23261A Burn of second degree of back of right hand, initial encounter: Secondary | ICD-10-CM

## 2021-05-04 DIAGNOSIS — Z01419 Encounter for gynecological examination (general) (routine) without abnormal findings: Secondary | ICD-10-CM

## 2021-05-04 DIAGNOSIS — Z0000 Encounter for general adult medical examination without abnormal findings: Secondary | ICD-10-CM

## 2021-05-04 DIAGNOSIS — Z23 Encounter for immunization: Secondary | ICD-10-CM | POA: Diagnosis not present

## 2021-05-04 MED ORDER — MECLIZINE HCL 25 MG PO TABS: ORAL_TABLET | ORAL | 0 refills | Status: DC

## 2021-05-04 NOTE — Progress Notes (Addendum)
Subjective:    Patient ID: Patricia [name], female    DOB: 1946-11-09, 75 y.o.   MRN: [id number]  HPI AWV- Annual Wellness Visit  The patient was seen for their annual wellness visit. The patient's past medical history, surgical history, and family history were reviewed. Pertinent vaccines were reviewed ( tetanus, pneumonia, shingles, flu) The patient's medication list was reviewed and updated.  The height and weight were entered.  BMI recorded in electronic record elsewhere  Cognitive screening was completed. Outcome of Mini - Cog: pass   Falls /depression screening electronically recorded within record elsewhere  Current tobacco usage: none (All patients who use tobacco were given written and verbal information on quitting)  Recent listing of emergency department/hospitalizations over the past year were reviewed.  current specialist the patient sees on a regular basis: none   Medicare annual wellness visit patient questionnaire was reviewed.  A written screening schedule for the patient for the next 5-10 years was given. Appropriate discussion of followup regarding next visit was discussed.  Regular vision and dental care.  Plans to get J&J COVID vaccine booster sometime soon. No new sexual partners. Gets labs (see scanned report) and mammograms through her employer.  Activity: continues to work out 2-3 times per week and has an active lifestyle.  Taking Pantoprazole twice a day. Taking some of her husband's med since he is not taking his. Helping some but acid reflux is not completely controlled. Eats late at night sometimes due to her schedule. Some caffeine intake but has cut back. Denies tobacco, alcohol or excessive NSAID use.  Rare use of Xanax and Antivert.  Depression screen [facility] 2/9 05/04/2021 07/20/2020 09/10/2019 09/15/2018 01/26/2018  Decreased Interest 2 0 2 1 0  Down, Depressed, Hopeless 1 0 2 1 0  PHQ - 2 Score 3 0 4 2 0  Altered sleeping 0 0 1 1 -  Tired,  decreased energy 0 0 1 1 -  Change in appetite 1 0 1 3 -  Feeling bad or failure about yourself  1 0 2 2 -  Trouble concentrating 0 0 0 2 -  Moving slowly or fidgety/restless 0 0 0 1 -  Suicidal thoughts 0 0 0 0 -  PHQ-9 Score 5 0 9 12 -  Difficult doing work/chores Not difficult at all Not difficult at all Somewhat difficult Very difficult -   Fall Risk  05/04/2021 07/20/2020 12/20/2016 12/01/2015  Falls in the past year? 1 1 No No  Number falls in past yr: 1 0 - -  Injury with Fall? 0 0 - -  Risk for fall due to : Impaired balance/gait - - -  Follow up Falls evaluation completed;Education provided Falls evaluation completed;Education provided - -       Review of Systems  Constitutional: Negative for activity change, appetite change and fatigue.  Respiratory: Negative for cough, chest tightness, shortness of breath and wheezing.   Cardiovascular: Negative for chest pain and leg swelling.  Gastrointestinal: Negative for abdominal distention, abdominal pain, blood in stool, constipation, diarrhea, nausea and vomiting.  Genitourinary: Negative for difficulty urinating, dysuria, enuresis, frequency, genital sores, pelvic pain, urgency, vaginal bleeding and vaginal discharge.       Objective:   Physical Exam Constitutional:      General: She is not in acute distress.    Appearance: She is well-developed.  Neck:     Thyroid: No thyromegaly.     Trachea: No tracheal deviation.     Comments: Thyroid non  tender; no mass or goiter noted.  Cardiovascular:     Rate and Rhythm: Normal rate and regular rhythm.     Heart sounds: Normal heart sounds. No murmur heard. No gallop.   Pulmonary:     Effort: Pulmonary effort is normal.     Breath sounds: Normal breath sounds.  Chest:  Breasts:     Right: No swelling, inverted nipple, mass, skin change, tenderness, axillary adenopathy or supraclavicular adenopathy.     Left: Skin change present. No swelling, inverted nipple, mass, tenderness,  axillary adenopathy or supraclavicular adenopathy.      Comments: Small superficial ecchymotic area right breast from recent injury. Abdominal:     General: There is no distension.     Palpations: Abdomen is soft. There is no mass.     Tenderness: There is no abdominal tenderness. There is no guarding or rebound.  Genitourinary:    Vagina: Normal. No vaginal discharge.     Comments: External GU: no rashes or lesions. Vagina: pale and moist. Bimanual exam: no tenderness or obvious masses. Mild vaginal prolapse noted but within introitus. Denies any symptoms.  Musculoskeletal:     Cervical back: Normal range of motion and neck supple.     Right lower leg: No edema.     Left lower leg: No edema.  Lymphadenopathy:     Cervical: No cervical adenopathy.     Upper Body:     Right upper body: No supraclavicular, axillary or pectoral adenopathy.     Left upper body: No supraclavicular, axillary or pectoral adenopathy.  Skin:    General: Skin is warm and dry.     Comments: Mild generalized sun damage with nevi and cherry angiomas. Superficial healing second degree burn noted on the back of the right hand. No evidence of infection.   Neurological:     Mental Status: She is alert and oriented to person, place, and time.  Psychiatric:        Mood and Affect: Mood normal.        Behavior: Behavior normal.        Thought Content: Thought content normal.        Judgment: Judgment normal.    Today's Vitals   05/04/21 0951  BP: 138/84  Pulse: 69  Temp: (!) 96.4 F (35.8 C)  SpO2: 97%  Weight: 144 lb (65.3 kg)   Body mass index is 31.16 kg/m.     Assessment & Plan:   Problem List Items Addressed This Visit      Digestive   GERD (gastroesophageal reflux disease)   Relevant Medications   meclizine (ANTIVERT) 25 MG tablet   Other Relevant Orders   Ambulatory referral to Gastroenterology    Other Visit Diagnoses    Well woman exam    -  Primary   Relevant Orders   Tdap vaccine  greater than or equal to 7yo IM (Completed)   Second degree burn of back of hand, right, initial encounter       Relevant Orders   Tdap vaccine greater than or equal to 7yo IM (Completed)   Need for vaccination       Relevant Orders   Tdap vaccine greater than or equal to 7yo IM (Completed)     Meds ordered this encounter  Medications  . meclizine (ANTIVERT) 25 MG tablet    Sig: Take one tablet po TID prn dizziness    Dispense:  30 tablet    Refill:  0    Order Specific  Question:   Supervising Provider    Answer:   Sallee Lange A [[id number]]   Tdap today due to burn injury. Recommend shingles vaccine at local pharmacy. Also recommend J&J booster.  Refer to GI specialist due to persistent GERD on PPI. Continue Pantoprazole until then. Labs and mammogram through her employer. Return in about 6 months (around 11/04/2021) for Routine follow up.

## 2021-05-04 NOTE — Patient Instructions (Addendum)
Consider skin cancer screening Shingrix vaccine  Avoid all NSAIDs such as Aleve, Ibuprofen, BC powders Continue Pantoprazole until you see the specialist   Food Choices for Gastroesophageal Reflux Disease, Adult When you have gastroesophageal reflux disease (GERD), the foods you eat and your eating habits are very important. Choosing the right foods can help ease your discomfort. Think about working with a food expert (dietitian) to help you make good choices. What are tips for following this plan? Reading food labels  Look for foods that are low in saturated fat. Foods that may help with your symptoms include: ? Foods that have less than 5% of daily value (DV) of fat. ? Foods that have 0 grams of trans fat. Cooking  Do not fry your food.  Cook your food by baking, steaming, grilling, or broiling. These are all methods that do not need a lot of fat for cooking.  To add flavor, try to use herbs that are low in spice and acidity. Meal planning  Choose healthy foods that are low in fat, such as: ? Fruits and vegetables. ? Whole grains. ? Low-fat dairy products. ? Lean meats, fish, and poultry.  Eat small meals often instead of eating 3 large meals each day. Eat your meals slowly in a place where you are relaxed. Avoid bending over or lying down until 2-3 hours after eating.  Limit high-fat foods such as fatty meats or fried foods.  Limit your intake of fatty foods, such as oils, butter, and shortening.  Avoid the following as told by your doctor: ? Foods that cause symptoms. These may be different for different people. Keep a food diary to keep track of foods that cause symptoms. ? Alcohol. ? Drinking a lot of liquid with meals. ? Eating meals during the 2-3 hours before bed.   Lifestyle  Stay at a healthy weight. Ask your doctor what weight is healthy for you. If you need to lose weight, work with your doctor to do so safely.  Exercise for at least 30 minutes on 5 or more  days each week, or as told by your doctor.  Wear loose-fitting clothes.  Do not smoke or use any products that contain nicotine or tobacco. If you need help quitting, ask your doctor.  Sleep with the head of your bed higher than your feet. Use a wedge under the mattress or blocks under the bed frame to raise the head of the bed.  Chew sugar-free gum after meals. What foods should eat? Eat a healthy, well-balanced diet of fruits, vegetables, whole grains, low-fat dairy products, lean meats, fish, and poultry. Each person is different. Foods that may cause symptoms in one person may not cause any symptoms in another person. Work with your doctor to find foods that are safe for you. The items listed above may not be a complete list of what you can eat and drink. Contact a food expert for more options.   What foods should I avoid? Limiting some of these foods may help in managing the symptoms of GERD. Everyone is different. Talk with a food expert or your doctor to help you find the exact foods to avoid, if any. Fruits Any fruits prepared with added fat. Any fruits that cause symptoms. For some people, this may include citrus fruits, such as oranges, grapefruit, pineapple, and lemons. Vegetables Deep-fried vegetables. [place] fries. Any vegetables prepared with added fat. Any vegetables that cause symptoms. For some people, this may include tomatoes and tomato products, chili peppers,  onions and garlic, and horseradish. Grains Pastries or quick breads with added fat. Meats and other proteins High-fat meats, such as fatty beef or pork, hot dogs, ribs, ham, sausage, salami, and bacon. Fried meat or protein, including fried fish and fried chicken. Nuts and nut butters, in large amounts. Dairy Whole milk and chocolate milk. Sour cream. Cream. Ice cream. Cream cheese. Milkshakes. Fats and oils Butter. Margarine. Shortening. Ghee. Beverages Coffee and tea, with or without caffeine. Carbonated  beverages. Sodas. Energy drinks. Fruit juice made with acidic fruits, such as orange or grapefruit. Tomato juice. Alcoholic drinks. Sweets and desserts Chocolate and cocoa. Donuts. Seasonings and condiments Pepper. Peppermint and spearmint. Added salt. Any condiments, herbs, or seasonings that cause symptoms. For some people, this may include curry, hot sauce, or vinegar-based salad dressings. The items listed above may not be a complete list of what you should not eat and drink. Contact a food expert for more options. Questions to ask your doctor Diet and lifestyle changes are often the first steps that are taken to manage symptoms of GERD. If diet and lifestyle changes do not help, talk with your doctor about taking medicines. Where to find more information  International Foundation for Gastrointestinal Disorders: aboutgerd.org Summary  When you have GERD, food and lifestyle choices are very important in easing your symptoms.  Eat small meals often instead of 3 large meals a day. Eat your meals slowly and in a place where you are relaxed.  Avoid bending over or lying down until 2-3 hours after eating.  Limit high-fat foods such as fatty meats or fried foods. This information is not intended to replace advice given to you by your health care provider. Make sure you discuss any questions you have with your health care provider. Document Revised: 06/05/2020 Document Reviewed: 06/05/2020 Elsevier Patient Education  2021 [place].   Zoster Vaccine, Recombinant injection What is this medicine? ZOSTER VACCINE (ZOS ter vak SEEN) is a vaccine used to reduce the risk of getting shingles. This vaccine is not used to treat shingles or nerve pain from shingles. This medicine may be used for other purposes; ask your health care provider or pharmacist if you have questions. COMMON BRAND NAME(S): [facility] What should I tell my health care provider before I take this medicine? They need to know  if you have any of these conditions:  cancer  immune system problems  an unusual or allergic reaction to Zoster vaccine, other medications, foods, dyes, or preservatives  pregnant or trying to get pregnant  breast-feeding How should I use this medicine? This vaccine is injected into a muscle. It is given by a health care provider. A copy of Vaccine Information Statements will be given before each vaccination. Be sure to read this information carefully each time. This sheet may change often. Talk to your health care provider about the use of this vaccine in children. This vaccine is not approved for use in children. Overdosage: If you think you have taken too much of this medicine contact a poison control center or emergency room at once. NOTE: This medicine is only for you. Do not share this medicine with others. What if I miss a dose? Keep appointments for follow-up (booster) doses. It is important not to miss your dose. Call your health care provider if you are unable to keep an appointment. What may interact with this medicine?  medicines that suppress your immune system  medicines to treat cancer  steroid medicines like prednisone or cortisone This  list may not describe all possible interactions. Give your health care provider a list of all the medicines, herbs, non-prescription drugs, or dietary supplements you use. Also tell them if you smoke, drink alcohol, or use illegal drugs. Some items may interact with your medicine. What should I watch for while using this medicine? Visit your health care provider regularly. This vaccine, like all vaccines, may not fully protect everyone. What side effects may I notice from receiving this medicine? Side effects that you should report to your doctor or health care professional as soon as possible:  allergic reactions (skin rash, itching or hives; swelling of the face, lips, or tongue)  trouble breathing Side effects that usually do not  require medical attention (report these to your doctor or health care professional if they continue or are bothersome):  chills  headache  fever  nausea  pain, redness, or irritation at site where injected  tiredness  vomiting This list may not describe all possible side effects. Call your doctor for medical advice about side effects. You may report side effects to FDA at 1-800-FDA-1088. Where should I keep my medicine? This vaccine is only given by a health care provider. It will not be stored at home. NOTE: This sheet is a summary. It may not cover all possible information. If you have questions about this medicine, talk to your doctor, pharmacist, or health care provider.  2021 Elsevier/Gold Standard (2019-12-31 16:23:07)

## 2021-05-11 DIAGNOSIS — R600 Localized edema: Secondary | ICD-10-CM | POA: Diagnosis not present

## 2021-05-11 DIAGNOSIS — M79604 Pain in right leg: Secondary | ICD-10-CM | POA: Diagnosis not present

## 2021-05-11 DIAGNOSIS — M79605 Pain in left leg: Secondary | ICD-10-CM | POA: Diagnosis not present

## 2021-05-11 DIAGNOSIS — I83893 Varicose veins of bilateral lower extremities with other complications: Secondary | ICD-10-CM | POA: Diagnosis not present

## 2021-05-14 ENCOUNTER — Encounter: Payer: Self-pay | Admitting: *Deleted

## 2021-05-25 ENCOUNTER — Other Ambulatory Visit: Payer: Self-pay | Admitting: Family Medicine

## 2021-06-05 ENCOUNTER — Other Ambulatory Visit: Payer: Self-pay | Admitting: Family Medicine

## 2021-06-06 NOTE — Telephone Encounter (Signed)
90-day with 1 refill

## 2021-07-05 DIAGNOSIS — Z1231 Encounter for screening mammogram for malignant neoplasm of breast: Secondary | ICD-10-CM | POA: Diagnosis not present

## 2021-07-05 LAB — HM MAMMOGRAPHY: HM Mammogram: NORMAL (ref 0–4)

## 2021-07-09 ENCOUNTER — Encounter: Payer: Self-pay | Admitting: Family Medicine

## 2021-07-13 ENCOUNTER — Encounter: Payer: Self-pay | Admitting: Gastroenterology

## 2021-07-13 ENCOUNTER — Other Ambulatory Visit: Payer: Self-pay

## 2021-07-13 ENCOUNTER — Ambulatory Visit (INDEPENDENT_AMBULATORY_CARE_PROVIDER_SITE_OTHER): Payer: BC Managed Care – PPO | Admitting: Gastroenterology

## 2021-07-13 VITALS — BP 140/88 | HR 73 | Temp 97.8°F | Ht <= 58 in | Wt 143.8 lb

## 2021-07-13 DIAGNOSIS — R1319 Other dysphagia: Secondary | ICD-10-CM

## 2021-07-13 DIAGNOSIS — K219 Gastro-esophageal reflux disease without esophagitis: Secondary | ICD-10-CM | POA: Diagnosis not present

## 2021-07-13 DIAGNOSIS — R1310 Dysphagia, unspecified: Secondary | ICD-10-CM | POA: Insufficient documentation

## 2021-07-13 MED ORDER — RABEPRAZOLE SODIUM 20 MG PO TBEC: 20.0000 mg | DELAYED_RELEASE_TABLET | Freq: Two times a day (BID) | ORAL | 3 refills | Status: DC

## 2021-07-13 NOTE — Patient Instructions (Addendum)
Stop pantoprazole. Start rabeprazole 20mg  twice daily before a meal. RX sent to [facility] Call if any difficulty obtaining medication. Upper endoscopy to be scheduled. See separate instructions.

## 2021-07-13 NOTE — Progress Notes (Signed)
Primary Care Physician:  [name], MD  Primary Gastroenterologist:  Garfield Cornea, MD   Chief Complaint  Patient presents with   Gastroesophageal Reflux    Lost of relux, gets choked easy    HPI:  Patricia [name] is a 75 y.o. female here at the request of Pearson Forster, NP for further evaluation of GERD.  H/o chronic GERD. Has been on PPI for over 5 years. Has been on pantoprazole twice a day for couple of years. For the last year, she has noted increased difficulty controlling her reflux. Worse with meals and at night time. She has episodes of regurgitation. Sleeps propped up on pillows. Having trouble swallowing solid food and medication. Feels like it sticks in upper chest. Doesn't have as much problem with soft foods. Has been on pantoprazole for years. Before that on omeprazole. Tried Dexilant before and it helped but previously was not on her insurance and she could not afford. No abdominal pain. BMs regular. No melena, brbpr.   Occasional Aleve for joint/muscle pain. No ASA.   No prior EGD.     Current Outpatient Medications  Medication Sig Dispense Refill   ALPRAZolam (XANAX) 0.5 MG tablet Take 1 tablet (0.5 mg total) by mouth at bedtime as needed for sleep. 30 tablet 5   Calcium Carb-Cholecalciferol (CALCIUM 600 + D PO) Take 1 tablet by mouth at bedtime.      fluticasone (FLONASE) 50 MCG/ACT nasal spray Place 2 sprays into both nostrils daily. 48 g 1   meclizine (ANTIVERT) 25 MG tablet Take one tablet po TID prn dizziness 30 tablet 0   Multiple Vitamin (MULTIVITAMIN WITH MINERALS) TABS tablet Take 1 tablet by mouth daily.     ondansetron (ZOFRAN ODT) 4 MG disintegrating tablet Take one tablet po q 6 hrs prn nausea 16 tablet 0   pantoprazole (PROTONIX) 40 MG tablet TAKE 1 TABLET BY MOUTH  DAILY (Patient taking differently: Take 40 mg by mouth 2 (two) times daily.) 90 tablet 3   PARoxetine (PAXIL) 40 MG tablet TAKE 1 TABLET BY MOUTH IN  THE MORNING 90 tablet 0    rosuvastatin (CRESTOR) 20 MG tablet TAKE 1 TABLET BY MOUTH  DAILY 90 tablet 1   No current facility-administered medications for this visit.    Allergies as of 07/13/2021 - Review Complete 07/13/2021  Allergen Reaction Noted   Codeine Nausea Only 09/11/2011    Past Medical History:  Diagnosis Date   Depression    GERD (gastroesophageal reflux disease)    Hypercholesterolemia    IFG (impaired fasting glucose)    Osteopenia    PONV (postoperative nausea and vomiting)     Past Surgical History:  Procedure Laterality Date   ABDOMINAL HYSTERECTOMY     CATARACT EXTRACTION W/PHACO  09/12/2011   Procedure: CATARACT EXTRACTION PHACO AND INTRAOCULAR LENS PLACEMENT ([place]);  Surgeon: Tonny Branch;  Location: [facility];  Service: Ophthalmology;  Laterality: Left;  CDE: 8.91   CATARACT EXTRACTION W/PHACO  11/18/2011   Procedure: CATARACT EXTRACTION PHACO AND INTRAOCULAR LENS PLACEMENT ([facility]);  Surgeon: Tonny Branch;  Location: [facility];  Service: Ophthalmology;  Laterality: Right;  CDE:10.26   COLONOSCOPY  2007   Dr. Gala Romney: internal hemorrhoids, single anal papilla poor prep.    COLONOSCOPY N/A 03/27/2017   Rourk: Normal exam.   ECTOPIC PREGNANCY SURGERY     removal of right tube   OVARIAN CYST REMOVAL     right   RECTOCELE REPAIR N/A 01/18/2014   Procedure: POSTERIOR REPAIR (RECTOCELE);  Surgeon: Jonnie Kind, MD;  Location: [facility];  Service: Gynecology;  Laterality: N/A;   VAGINAL HYSTERECTOMY N/A 01/18/2014   Procedure: HYSTERECTOMY VAGINAL;  Surgeon: Jonnie Kind, MD;  Location: [facility];  Service: Gynecology;  Laterality: N/A;    Family History  Problem Relation Age of Onset   Heart attack Father    Osteoporosis Mother    Heart attack Brother 7       MI   Breast cancer Maternal Aunt    Breast cancer Paternal Aunt    Cancer Paternal Aunt        ovarian cancer   Anesthesia problems Neg Hx    Hypotension Neg Hx    Malignant hyperthermia Neg Hx    Pseudochol deficiency Neg Hx     Colon cancer Neg Hx     Social History   Socioeconomic History   Marital status: Married    Spouse name: Not on file   Number of children: Not on file   Years of education: Not on file   Highest education level: Not on file  Occupational History   Not on file  Tobacco Use   Smoking status: Former    Packs/day: 1.00    Years: 20.00    Pack years: 20.00    Types: Cigarettes    Quit date: 09/10/1984    Years since quitting: 36.8   Smokeless tobacco: Never  Substance and Sexual Activity   Alcohol use: No   Drug use: No   Sexual activity: Not Currently    Birth control/protection: Post-menopausal  Other Topics Concern   Not on file  Social History Narrative   Not on file   Social Determinants of Health   Financial Resource Strain: Not on file  Food Insecurity: Not on file  Transportation Needs: Not on file  Physical Activity: Not on file  Stress: Not on file  Social Connections: Not on file  Intimate Partner Violence: Not on file      ROS:  General: Negative for anorexia, weight loss, fever, chills, fatigue, weakness. Eyes: Negative for vision changes.  ENT: Negative for hoarseness,  nasal congestion. See hpi CV: Negative for chest pain, angina, palpitations, dyspnea on exertion, peripheral edema.  Respiratory: Negative for dyspnea at rest, dyspnea on exertion, cough, sputum, wheezing.  GI: See history of present illness. GU:  Negative for dysuria, hematuria, urinary incontinence, urinary frequency, nocturnal urination.  MS: Negative for joint pain, low back pain.  Derm: Negative for rash or itching.  Neuro: Negative for weakness, abnormal sensation, seizure, frequent headaches, memory loss, confusion.  Psych: Negative for anxiety, depression, suicidal ideation, hallucinations.  Endo: Negative for unusual weight change.  Heme: Negative for bruising or bleeding. Allergy: Negative for rash or hives.    Physical Examination:  BP 140/88   Pulse 73   Temp 97.8  F (36.6 C)   Ht 4\' 9"  (1.448 m)   Wt 143 lb 12.8 oz (65.2 kg)   BMI 31.12 kg/m    General: Well-nourished, well-developed in no acute distress.  Head: Normocephalic, atraumatic.   Eyes: Conjunctiva pink, no icterus. Mouth: masked Neck: Supple without thyromegaly, masses, or lymphadenopathy.  Lungs: Clear to auscultation bilaterally.  Heart: Regular rate and rhythm, no murmurs rubs or gallops.  Abdomen: Bowel sounds are normal, nontender, nondistended, no hepatosplenomegaly or masses, no abdominal bruits or    hernia , no rebound or guarding.   Rectal: not performed Extremities: No lower extremity edema. No clubbing or deformities.  Neuro: Alert  and oriented x 4 , grossly normal neurologically.  Skin: Warm and dry, no rash or jaundice.   Psych: Alert and cooperative, normal mood and affect.     Imaging Studies: No results found.   Assessment:  Pleasant 75 year old female with history of chronic GERD for years, no prior upper endoscopy, presenting for difficult to manage symptoms.  Has been on pantoprazole 40 mg twice daily for over a year.  Continues to have breakthrough heartburn especially at night. Complains of solid food and pill dysphagia.  Has been on omeprazole in the past which she believes was a failure.  Dexilant was helpful but she cannot afford.  Given refractory reflux and dysphagia, she needs an upper endoscopy for further evaluation.  Suspect complicated GERD such as reflux esophagitis, esophageal stricture, malignancy remains in the differential as well.   Plan: EGD/ED propofol in the near future.  Patient is okay with Dr. Abbey Chatters performing procedure if needed to expedite procedure. ASA II.  I have discussed the risks, alternatives, benefits with regards to but not limited to the risk of reaction to medication, bleeding, infection, perforation and the patient is agreeable to proceed. Written consent to be obtained. Change pantoprazole to have resolved to  rabeprazole milligrams twice daily. Reinforced antireflux measures.

## 2021-07-24 ENCOUNTER — Telehealth: Payer: Self-pay

## 2021-07-24 NOTE — Telephone Encounter (Signed)
Tried to call pt to schedule EGD/DIL w/Propofol ASA 2 w/Dr. Gala Romney, unable to leave voicemail d/t mailbox is full.

## 2021-08-09 NOTE — Telephone Encounter (Signed)
Called pt. She has been scheduled for 10/3 at 8:15am. Aware will mail prep instructions. Confirmed address.

## 2021-08-29 ENCOUNTER — Other Ambulatory Visit: Payer: Self-pay | Admitting: Family Medicine

## 2021-09-08 ENCOUNTER — Other Ambulatory Visit: Payer: Self-pay | Admitting: Family Medicine

## 2021-09-10 ENCOUNTER — Encounter (HOSPITAL_COMMUNITY)
Admission: RE | Disposition: A | Discharge status: Home / Self Care | Payer: Self-pay | Source: Home / Self Care | Attending: Internal Medicine

## 2021-09-10 ENCOUNTER — Ambulatory Visit (HOSPITAL_COMMUNITY): Payer: BC Managed Care – PPO | Admitting: Certified Registered"

## 2021-09-10 ENCOUNTER — Encounter (HOSPITAL_COMMUNITY): Payer: Self-pay | Admitting: Internal Medicine

## 2021-09-10 ENCOUNTER — Ambulatory Visit (HOSPITAL_COMMUNITY)
Admission: RE | Admit: 2021-09-10 | Discharge: 2021-09-10 | Disposition: A | Discharge status: Home / Self Care | Payer: BC Managed Care – PPO | Attending: Internal Medicine | Admitting: Internal Medicine

## 2021-09-10 ENCOUNTER — Other Ambulatory Visit: Payer: Self-pay

## 2021-09-10 DIAGNOSIS — Z79899 Other long term (current) drug therapy: Secondary | ICD-10-CM | POA: Diagnosis not present

## 2021-09-10 DIAGNOSIS — Z885 Allergy status to narcotic agent status: Secondary | ICD-10-CM | POA: Diagnosis not present

## 2021-09-10 DIAGNOSIS — Z87891 Personal history of nicotine dependence: Secondary | ICD-10-CM | POA: Diagnosis not present

## 2021-09-10 DIAGNOSIS — R1310 Dysphagia, unspecified: Secondary | ICD-10-CM | POA: Diagnosis not present

## 2021-09-10 DIAGNOSIS — R12 Heartburn: Secondary | ICD-10-CM | POA: Diagnosis not present

## 2021-09-10 DIAGNOSIS — K219 Gastro-esophageal reflux disease without esophagitis: Secondary | ICD-10-CM | POA: Diagnosis not present

## 2021-09-10 DIAGNOSIS — K449 Diaphragmatic hernia without obstruction or gangrene: Secondary | ICD-10-CM | POA: Insufficient documentation

## 2021-09-10 DIAGNOSIS — R1314 Dysphagia, pharyngoesophageal phase: Secondary | ICD-10-CM | POA: Diagnosis not present

## 2021-09-10 HISTORY — PX: ESOPHAGOGASTRODUODENOSCOPY (EGD) WITH PROPOFOL: SHX5813

## 2021-09-10 HISTORY — PX: MALONEY DILATION: SHX5535

## 2021-09-10 SURGERY — ESOPHAGOGASTRODUODENOSCOPY (EGD) WITH PROPOFOL
Anesthesia: General

## 2021-09-10 MED ORDER — PROPOFOL 10 MG/ML IV BOLUS
INTRAVENOUS | Status: DC | PRN
  Administered 2021-09-10: 30 mg via INTRAVENOUS
  Administered 2021-09-10: 100 mg via INTRAVENOUS

## 2021-09-10 MED ORDER — LACTATED RINGERS IV SOLN: INTRAVENOUS | Status: DC

## 2021-09-10 MED ORDER — LACTATED RINGERS IV SOLN: INTRAVENOUS | Status: DC | PRN

## 2021-09-10 MED ORDER — LIDOCAINE HCL (CARDIAC) PF 100 MG/5ML IV SOSY
PREFILLED_SYRINGE | INTRAVENOUS | Status: DC | PRN
  Administered 2021-09-10: 50 mg via INTRAVENOUS

## 2021-09-10 MED ORDER — PROPOFOL 500 MG/50ML IV EMUL
INTRAVENOUS | Status: DC | PRN
  Administered 2021-09-10: 150 ug/kg/min via INTRAVENOUS

## 2021-09-10 MED ORDER — STERILE WATER FOR IRRIGATION IR SOLN
Status: DC | PRN
  Administered 2021-09-10: 100 mL

## 2021-09-10 NOTE — Anesthesia Preprocedure Evaluation (Signed)
Anesthesia Evaluation  Patient identified by MRN, date of birth, ID band Patient awake    Reviewed: Allergy & Precautions, H&P , NPO status , Patient's Chart, lab work & pertinent test results, reviewed documented beta blocker date and time   History of Anesthesia Complications (+) PONV and history of anesthetic complications  Airway Mallampati: II  TM Distance: >3 FB Neck ROM: full    Dental no notable dental hx.    Pulmonary neg pulmonary ROS, former smoker,    Pulmonary exam normal breath sounds clear to auscultation       Cardiovascular Exercise Tolerance: Good negative cardio ROS   Rhythm:regular Rate:Normal     Neuro/Psych PSYCHIATRIC DISORDERS Anxiety Depression negative neurological ROS     GI/Hepatic Neg liver ROS, GERD  Medicated,  Endo/Other  negative endocrine ROS  Renal/GU negative Renal ROS  negative genitourinary   Musculoskeletal   Abdominal   Peds  Hematology negative hematology ROS (+)   Anesthesia Other Findings   Reproductive/Obstetrics negative OB ROS                             Anesthesia Physical Anesthesia Plan  ASA: 2  Anesthesia Plan: General   Post-op Pain Management:    Induction:   PONV Risk Score and Plan: Propofol infusion  Airway Management Planned:   Additional Equipment:   Intra-op Plan:   Post-operative Plan:   Informed Consent: I have reviewed the patients History and Physical, chart, labs and discussed the procedure including the risks, benefits and alternatives for the proposed anesthesia with the patient or authorized representative who has indicated his/her understanding and acceptance.     Dental Advisory Given  Plan Discussed with: CRNA  Anesthesia Plan Comments:         Anesthesia Quick Evaluation

## 2021-09-10 NOTE — Addendum Note (Signed)
Addendum  created 09/10/21 1215 by Orlie Dakin, CRNA   Intraprocedure Event edited, Intraprocedure Staff edited

## 2021-09-10 NOTE — H&P (Signed)
@LOGO @   Primary Care Physician:  [name], MD Primary Gastroenterologist:  Dr. Gala Romney  Pre-Procedure History & Physical: HPI:  Patricia [name] is a 75 y.o. female here for further evaluation of apparent refractory reflux symptoms.  Chronic esophageal dysphagia.  She tells [name] today that Aciphex 20 mg daily is doing very well for her reflux symptoms. No prior EGD. Past Medical History:  Diagnosis Date   Depression    GERD (gastroesophageal reflux disease)    Hypercholesterolemia    IFG (impaired fasting glucose)    Osteopenia    PONV (postoperative nausea and vomiting)     Past Surgical History:  Procedure Laterality Date   ABDOMINAL HYSTERECTOMY     CATARACT EXTRACTION W/PHACO  09/12/2011   Procedure: CATARACT EXTRACTION PHACO AND INTRAOCULAR LENS PLACEMENT ([place]);  Surgeon: Tonny Branch;  Location: [facility];  Service: Ophthalmology;  Laterality: Left;  CDE: 8.91   CATARACT EXTRACTION W/PHACO  11/18/2011   Procedure: CATARACT EXTRACTION PHACO AND INTRAOCULAR LENS PLACEMENT ([facility]);  Surgeon: Tonny Branch;  Location: [facility];  Service: Ophthalmology;  Laterality: Right;  CDE:10.26   COLONOSCOPY  2007   Dr. Gala Romney: internal hemorrhoids, single anal papilla poor prep.    COLONOSCOPY N/A 03/27/2017   Lashea Goda: Normal exam.   ECTOPIC PREGNANCY SURGERY     removal of right tube   OVARIAN CYST REMOVAL     right   RECTOCELE REPAIR N/A 01/18/2014   Procedure: POSTERIOR REPAIR (RECTOCELE);  Surgeon: Jonnie Kind, MD;  Location: [facility];  Service: Gynecology;  Laterality: N/A;   VAGINAL HYSTERECTOMY N/A 01/18/2014   Procedure: HYSTERECTOMY VAGINAL;  Surgeon: Jonnie Kind, MD;  Location: [facility];  Service: Gynecology;  Laterality: N/A;    Prior to Admission medications   Medication Sig Start Date End Date Taking? Authorizing Provider  ALPRAZolam Duanne Moron) 0.5 MG tablet Take 1 tablet (0.5 mg total) by mouth at bedtime as needed for sleep. 07/20/20  Yes [name], MD  b complex vitamins  capsule Take 1 capsule by mouth daily.   Yes [provider]  Calcium Carb-Cholecalciferol (CALCIUM 600 + D PO) Take 1 tablet by mouth at bedtime.    Yes [provider]  Cholecalciferol (VITAMIN D3) 125 MCG (5000 UT) CAPS Take 5,000 Units by mouth daily.   Yes [provider]  COLLAGEN PO Take 2 each by mouth daily. Chewables   Yes [provider]  Multiple Vitamin (MULTIVITAMIN WITH MINERALS) TABS tablet Take 1 tablet by mouth daily.   Yes [provider]  naproxen sodium (ALEVE) 220 MG tablet Take 220 mg by mouth daily as needed (pain).   Yes [provider]  PARoxetine (PAXIL) 40 MG tablet TAKE 1 TABLET BY MOUTH IN  THE MORNING 08/31/21  Yes Nilda Simmer, NP  RABEprazole (ACIPHEX) 20 MG tablet Take 1 tablet (20 mg total) by mouth 2 (two) times daily before a meal. Patient taking differently: Take 20 mg by mouth daily. 07/13/21  Yes Mahala Menghini, PA-C  rosuvastatin (CRESTOR) 20 MG tablet TAKE 1 TABLET BY MOUTH  DAILY 05/28/21  Yes Luking, Elayne Snare, MD  fluticasone (FLONASE) 50 MCG/ACT nasal spray Place 2 sprays into both nostrils daily. 04/28/19   [name], MD  meclizine (ANTIVERT) 25 MG tablet Take one tablet po TID prn dizziness Patient taking differently: Take 25 mg by mouth 3 (three) times daily as needed for dizziness. 05/04/21   Nilda Simmer, NP  ondansetron (ZOFRAN ODT) 4  MG disintegrating tablet Take one tablet po q 6 hrs prn nausea Patient taking differently: Take 4 mg by mouth every 6 (six) hours as needed for nausea or vomiting. 11/15/19   [name], MD    Allergies as of 08/09/2021 - Review Complete 07/13/2021  Allergen Reaction Noted   Codeine Nausea Only 09/11/2011    Family History  Problem Relation Age of Onset   Heart attack Father    Osteoporosis Mother    Heart attack Brother 71       MI   Breast cancer Maternal Aunt    Breast cancer Paternal Aunt    Cancer Paternal Aunt        ovarian  cancer   Anesthesia problems Neg Hx    Hypotension Neg Hx    Malignant hyperthermia Neg Hx    Pseudochol deficiency Neg Hx    Colon cancer Neg Hx     Social History   Socioeconomic History   Marital status: Married    Spouse name: Not on file   Number of children: Not on file   Years of education: Not on file   Highest education level: Not on file  Occupational History   Not on file  Tobacco Use   Smoking status: Former    Packs/day: 1.00    Years: 20.00    Pack years: 20.00    Types: Cigarettes    Quit date: 09/10/1984    Years since quitting: 37.0   Smokeless tobacco: Never  Substance and Sexual Activity   Alcohol use: No   Drug use: No   Sexual activity: Not Currently    Birth control/protection: Post-menopausal  Other Topics Concern   Not on file  Social History Narrative   Not on file   Social Determinants of Health   Financial Resource Strain: Not on file  Food Insecurity: Not on file  Transportation Needs: Not on file  Physical Activity: Not on file  Stress: Not on file  Social Connections: Not on file  Intimate Partner Violence: Not on file    Review of Systems: See HPI, otherwise negative ROS  Physical Exam: BP (!) 152/78   Pulse (!) 56   Temp 98 F (36.7 C) (Oral)   Resp 15   Ht 4\' 10"  (1.473 m)   Wt 65.8 kg   SpO2 99%   BMI 30.31 kg/m  General:   Alert,  Well-developed, well-nourished, pleasant and cooperative in NAD Neck:  Supple; no masses or thyromegaly. No significant cervical adenopathy. Lungs:  Clear throughout to auscultation.   No wheezes, crackles, or rhonchi. No acute distress. Heart:  Regular rate and rhythm; no murmurs, clicks, rubs,  or gallops. Abdomen: Non-distended, normal bowel sounds.  Soft and nontender without appreciable mass or hepatosplenomegaly.  Pulses:  Normal pulses noted. Extremities:  Without clubbing or edema.  Impression/Plan: 75 year old lady with longstanding GERD and esophageal dysphagia here for EGD.   Refractory symptoms for a number of PPIs.  Doing well on rabeprazole 20 mg daily  I have offered the patient an EGD with esophageal dilation as feasible/appropriate per plan.  The risks, benefits, limitations, alternatives and imponderables have been reviewed with the patient. Potential for esophageal dilation, biopsy, etc. have also been reviewed.  Questions have been answered. All parties agreeable.      Notice: This dictation was prepared with Dragon dictation along with smaller phrase technology. Any transcriptional errors that result from this process are unintentional and may not be corrected upon review.

## 2021-09-10 NOTE — Anesthesia Procedure Notes (Signed)
Date/Time: 09/10/2021 7:39 AM Performed by: Orlie Dakin, CRNA Pre-anesthesia Checklist: Patient identified, Emergency Drugs available, Suction available and Patient being monitored Patient Re-evaluated:Patient Re-evaluated prior to induction Oxygen Delivery Method: Nasal cannula Induction Type: IV induction Placement Confirmation: positive ETCO2

## 2021-09-10 NOTE — Transfer of Care (Signed)
Immediate Anesthesia Transfer of Care Note  Patient: Patricia [name]  Procedure(s) Performed: ESOPHAGOGASTRODUODENOSCOPY (EGD) WITH PROPOFOL MALONEY DILATION  Patient Location: Endoscopy Unit  Anesthesia Type:General  Level of Consciousness: drowsy  Airway & Oxygen Therapy: Patient Spontanous Breathing  Post-op Assessment: Report given to RN and Post -op Vital signs reviewed and stable  Post vital signs: Reviewed and stable  Last Vitals:  Vitals Value Taken Time  BP    Temp 36.7 C 09/10/21 0748  Pulse    Resp    SpO2      Last Pain:  Vitals:   09/10/21 0748  TempSrc: Oral  PainSc:          Complications: No notable events documented.

## 2021-09-10 NOTE — Anesthesia Postprocedure Evaluation (Signed)
Anesthesia Post Note  Patient: Patricia [name]  Procedure(s) Performed: ESOPHAGOGASTRODUODENOSCOPY (EGD) WITH PROPOFOL [place]  Patient location during evaluation: Phase II Anesthesia Type: General Level of consciousness: awake Pain management: pain level controlled Vital Signs Assessment: post-procedure vital signs reviewed and stable Respiratory status: spontaneous breathing and respiratory function stable Cardiovascular status: blood pressure returned to baseline and stable Postop Assessment: no headache and no apparent nausea or vomiting Anesthetic complications: no Comments: Late entry   No notable events documented.   Last Vitals:  Vitals:   09/10/21 0703 09/10/21 0748  BP: (!) 152/78 126/67  Pulse: (!) 56   Resp: 15 19  Temp: 36.7 C 36.7 C  SpO2: 99% 93%    Last Pain:  Vitals:   09/10/21 0748  TempSrc: Oral  PainSc: 0-No pain                 Louann Sjogren

## 2021-09-10 NOTE — Op Note (Signed)
[facility] Patient Name: Patricia [name] Procedure Date: 09/10/2021 7:05 AM MRN: [id number] Date of Birth: [birth year] Attending MD: Norvel Richards , MD CSN: [id number] Age: 75 Admit Type: Outpatient Procedure:                Upper GI endoscopy Indications:              Dysphagia, Heartburn Providers:                Norvel Richards, MD, Crystal Page, [place]                            Risa Grill, Technician Referring MD:              Medicines:                Propofol per Anesthesia Complications:            No immediate complications. Estimated Blood Loss:     Estimated blood loss: none. Estimated blood loss:                            none. Procedure:                Pre-Anesthesia Assessment:                           - Prior to the procedure, a History and Physical                            was performed, and patient medications and                            allergies were reviewed. The patient's tolerance of                            previous anesthesia was also reviewed. The risks                            and benefits of the procedure and the sedation                            options and risks were discussed with the patient.                            All questions were answered, and informed consent                            was obtained. Prior Anticoagulants: The patient has                            taken no previous anticoagulant or antiplatelet                            agents. ASA Grade Assessment: III - A patient with  severe systemic disease. After reviewing the risks                            and benefits, the patient was deemed in                            satisfactory condition to undergo the procedure.                           After obtaining informed consent, the endoscope was                            passed under direct vision. Throughout the                            procedure, the patient's blood  pressure, pulse, and                            oxygen saturations were monitored continuously. The                            [id number]-H190 ([id number]) scope was introduced through the                            mouth, and advanced to the second part of duodenum.                            The upper GI endoscopy was accomplished without                            difficulty. The patient tolerated the procedure                            well. Scope In: 7:39:50 AM Scope Out: 7:45:32 AM Total Procedure Duration: 0 hours 5 minutes 42 seconds  Findings:      The examined esophagus was normal.      A medium-sized hiatal hernia was present.      The in the stomach was normal.      The duodenal bulb and second portion of the duodenum were normal. The       scope was withdrawn. Dilation was performed with a Maloney dilator with       mild resistance at 48 Fr. The dilation site was examined following       endoscope reinsertion and showed no change. Estimated blood loss: none. Impression:               - Normal esophagus.                           - Medium-sized hiatal hernia.                           - Normal stomach.                           - Normal duodenal bulb and second  portion of the                            duodenum.                           - No specimens collected. Patient now doing well on                            rabeprazole 20 mg daily Moderate Sedation:      Moderate (conscious) sedation was personally administered by an       anesthesia professional. The following parameters were monitored: oxygen       saturation, heart rate, blood pressure, respiratory rate, EKG, adequacy       of pulmonary ventilation, and response to care. Recommendation:           - Patient has a contact number available for                            emergencies. The signs and symptoms of potential                            delayed complications were discussed with the                             patient. Return to normal activities tomorrow.                            Written discharge instructions were provided to the                            patient.                           - Advance diet as tolerated.                           - Continue present medications.                           - Return to my office in 6 months. Procedure Code(s):        --- Professional ---                           [phone], Esophagogastroduodenoscopy, flexible,                            transoral; diagnostic, including collection of                            specimen(s) by brushing or washing, when performed                            (separate procedure)                           54098, Dilation of esophagus, by unguided sound  or                            bougie, single or multiple passes Diagnosis Code(s):        --- Professional ---                           K44.9, Diaphragmatic hernia without obstruction or                            gangrene                           R13.10, Dysphagia, unspecified                           R12, Heartburn CPT copyright 2019 [employer]. All rights reserved. The codes documented in this report are preliminary and upon coder review may  be revised to meet current compliance requirements. Cristopher Estimable. Trudi Morgenthaler, MD Norvel Richards, MD 09/10/2021 7:55:20 AM This report has been signed electronically. Number of Addenda: 0

## 2021-09-10 NOTE — Addendum Note (Signed)
Addendum  created 09/10/21 0848 by Orlie Dakin, CRNA   Flowsheet accepted, Intraprocedure Meds edited

## 2021-09-10 NOTE — Discharge Instructions (Addendum)
  Colonoscopy Discharge Instructions  Read the instructions outlined below and refer to this sheet in the next few weeks. These discharge instructions provide you with general information on caring for yourself after you leave the hospital. Your doctor may also give you specific instructions. While your treatment has been planned according to the most current medical practices available, unavoidable complications occasionally occur. If you have any problems or questions after discharge, call Dr. Gala Romney at [phone]. ACTIVITY You may resume your regular activity, but move at a slower pace for the next 24 hours.  Take frequent rest periods for the next 24 hours.  Walking will help get rid of the air and reduce the bloated feeling in your belly (abdomen).  No driving for 24 hours (because of the medicine (anesthesia) used during the test).   Do not sign any important legal documents or operate any machinery for 24 hours (because of the anesthesia used during the test).  NUTRITION Drink plenty of fluids.  You may resume your normal diet as instructed by your doctor.  Begin with a light meal and progress to your normal diet. Heavy or fried foods are harder to digest and may make you feel sick to your stomach (nauseated).  Avoid alcoholic beverages for 24 hours or as instructed.  MEDICATIONS You may resume your normal medications unless your doctor tells you otherwise.  WHAT YOU CAN EXPECT TODAY Some feelings of bloating in the abdomen.  Passage of more gas than usual.  Spotting of blood in your stool or on the toilet paper.  IF YOU HAD POLYPS REMOVED DURING THE COLONOSCOPY: No aspirin products for 7 days or as instructed.  No alcohol for 7 days or as instructed.  Eat a soft diet for the next 24 hours.  FINDING OUT THE RESULTS OF YOUR TEST Not all test results are available during your visit. If your test results are not back during the visit, make an appointment with your caregiver to find out the  results. Do not assume everything is normal if you have not heard from your caregiver or the medical facility. It is important for you to follow up on all of your test results.  SEEK IMMEDIATE MEDICAL ATTENTION IF: You have more than a spotting of blood in your stool.  Your belly is swollen (abdominal distention).  You are nauseated or vomiting.  You have a temperature over 101.  You have abdominal pain or discomfort that is severe or gets worse throughout the day.   You have a moderate size hiatal hernia which is contributing to reflux.  Your esophagus was stretched today.  We are treating your acid reflux and hiatal hernia with rabeprazole 20 mg daily.  You do not need surgery.  Office visit with [name] in 6 months- office will notify you of your appointment  At patient request, I called [phone]-reviewed results

## 2021-09-17 ENCOUNTER — Encounter (HOSPITAL_COMMUNITY): Payer: Self-pay | Admitting: Internal Medicine

## 2021-11-08 ENCOUNTER — Other Ambulatory Visit: Payer: Self-pay | Admitting: Family Medicine

## 2022-01-04 ENCOUNTER — Ambulatory Visit (INDEPENDENT_AMBULATORY_CARE_PROVIDER_SITE_OTHER): Payer: Medicare HMO | Admitting: Nurse Practitioner

## 2022-01-04 ENCOUNTER — Other Ambulatory Visit: Payer: Self-pay

## 2022-01-04 ENCOUNTER — Encounter: Payer: Self-pay | Admitting: Nurse Practitioner

## 2022-01-04 VITALS — BP 132/82 | Ht <= 58 in | Wt 144.4 lb

## 2022-01-04 DIAGNOSIS — E782 Mixed hyperlipidemia: Secondary | ICD-10-CM | POA: Diagnosis not present

## 2022-01-04 DIAGNOSIS — F419 Anxiety disorder, unspecified: Secondary | ICD-10-CM | POA: Diagnosis not present

## 2022-01-04 DIAGNOSIS — R69 Illness, unspecified: Secondary | ICD-10-CM | POA: Diagnosis not present

## 2022-01-04 DIAGNOSIS — J019 Acute sinusitis, unspecified: Secondary | ICD-10-CM | POA: Diagnosis not present

## 2022-01-04 DIAGNOSIS — B9689 Other specified bacterial agents as the cause of diseases classified elsewhere: Secondary | ICD-10-CM | POA: Diagnosis not present

## 2022-01-04 MED ORDER — AMOXICILLIN-POT CLAVULANATE 875-125 MG PO TABS: 1.0000 | ORAL_TABLET | Freq: Two times a day (BID) | ORAL | 0 refills | Status: DC

## 2022-01-04 NOTE — Progress Notes (Signed)
° °  Subjective:    Patient ID: Patricia [name], female    DOB: 1946/02/19, 76 y.o.   MRN: [id number]  HPI  Patient arrives for a follow up on cholesterol and reflux.  Had an EGD in October 2022.  Has been prescribed daily Aciphex by her GI specialist which is working well. Anxiety stable with paroxetine 40 mg daily.  Takes occasional low-dose Xanax for sleep. Adherent to her medication regimen including rosuvastatin 20 mg daily. Began having head congestion about 2 weeks ago.  Producing light green sputum at this time.  Occasional spells of productive cough.  No fever sore throat or ear pain.  No significant sinus headache.  Has had postnasal drainage which collects in the upper airways.  Has not really tried anything for her symptoms.  Is not taking her Flonase at this time. Continues to work.  States she feels good overall.  Review of Systems  Constitutional:  Negative for fever.  HENT:  Positive for congestion, postnasal drip and sinus pressure. Negative for ear pain and sore throat.   Respiratory:  Positive for cough. Negative for chest tightness, shortness of breath and wheezing.   Cardiovascular:  Negative for chest pain.      Objective:   Physical Exam NAD.  Alert, oriented.  TMs very retracted bilaterally.  Pharynx injected with slightly green PND noted.  Neck supple with mild soft anterior adenopathy.  Lungs clear.  Heart regular rate rhythm. Patient has brought in screening labs from another provider.  See scanned report. Total cholesterol 208 HDL 67 LDL 122 Triglycerides 106 Today's Vitals   01/04/22 1441  BP: 132/82  Weight: 144 lb 6.4 oz (65.5 kg)  Height: 4\' 9"  (1.448 m)   Body mass index is 31.25 kg/m.      Assessment & Plan:   Problem List Items Addressed This Visit       Other   Hyperlipemia (Chronic)   Anxiety   Other Visit Diagnoses     Acute bacterial rhinosinusitis    -  Primary   Relevant Medications   amoxicillin-clavulanate (AUGMENTIN) 875-125  MG tablet      Meds ordered this encounter  Medications   amoxicillin-clavulanate (AUGMENTIN) 875-125 MG tablet    Sig: Take 1 tablet by mouth 2 (two) times daily.    Dispense:  20 tablet    Refill:  0    Order Specific Question:   Supervising Provider    Answer:   Sallee Lange A [[id number]]   Continue current medication regimen as directed. Restart Flonase as directed.  Also Mucinex DM as directed for cough and congestion.  Warning signs reviewed.  Call back if symptoms worsen or persist.  Otherwise recheck in 6 months for her annual wellness exam.

## 2022-01-04 NOTE — Patient Instructions (Signed)
Mucinex DM Expectorant

## 2022-01-20 ENCOUNTER — Other Ambulatory Visit: Payer: Self-pay | Admitting: Family Medicine

## 2022-03-01 ENCOUNTER — Telehealth: Payer: Self-pay | Admitting: Nurse Practitioner

## 2022-03-01 ENCOUNTER — Other Ambulatory Visit: Payer: Self-pay | Admitting: Nurse Practitioner

## 2022-03-01 MED ORDER — PAROXETINE HCL 40 MG PO TABS: 40.0000 mg | ORAL_TABLET | Freq: Every morning | ORAL | 1 refills | Status: DC

## 2022-03-01 MED ORDER — MECLIZINE HCL 25 MG PO TABS: ORAL_TABLET | ORAL | 0 refills | Status: DC

## 2022-03-01 MED ORDER — ROSUVASTATIN CALCIUM 20 MG PO TABS: 20.0000 mg | ORAL_TABLET | Freq: Every day | ORAL | 1 refills | Status: DC

## 2022-03-01 MED ORDER — ALPRAZOLAM 0.5 MG PO TABS: ORAL_TABLET | ORAL | 2 refills | Status: DC

## 2022-03-01 NOTE — Telephone Encounter (Signed)
Please advise. Thank you

## 2022-03-01 NOTE — Telephone Encounter (Signed)
Patient is requesting refill on Alprazolam 0.5 mg, Meclizine 25 mg , Paroxetine 40 mg, Rosuvastatin 20 mg send to [facility] She is requesting all medication be [facility]. ? ?

## 2022-03-01 NOTE — Telephone Encounter (Signed)
Done

## 2022-03-08 ENCOUNTER — Ambulatory Visit (INDEPENDENT_AMBULATORY_CARE_PROVIDER_SITE_OTHER): Payer: Medicare HMO | Admitting: Nurse Practitioner

## 2022-03-08 ENCOUNTER — Encounter: Payer: Self-pay | Admitting: Nurse Practitioner

## 2022-03-08 VITALS — BP 137/83 | HR 70 | Temp 97.3°F | Ht <= 58 in | Wt 145.0 lb

## 2022-03-08 DIAGNOSIS — K219 Gastro-esophageal reflux disease without esophagitis: Secondary | ICD-10-CM

## 2022-03-08 MED ORDER — RABEPRAZOLE SODIUM 20 MG PO TBEC: 20.0000 mg | DELAYED_RELEASE_TABLET | Freq: Two times a day (BID) | ORAL | 3 refills | Status: DC

## 2022-03-08 NOTE — Progress Notes (Signed)
? ?  Subjective:  ? ? Patient ID: Patricia [name], female    DOB: 26-Jun-1946, 76 y.o.   MRN: [id number] ? ?Gastroesophageal Reflux ?She reports no abdominal pain or no chest pain. Refill aciphex/ rabeprazole ?Had EGD in October 2022.  Was advised by GI specialist to continue her Aciphex which she has been on long-term.  States when she takes this her symptoms are completely controlled. ? ? ?Review of Systems  ?Respiratory:  Negative for chest tightness and shortness of breath.   ?Cardiovascular:  Negative for chest pain.  ?Gastrointestinal:  Negative for abdominal pain.  ? ?   ?Objective:  ? Physical Exam ?Constitutional:   ?   General: She is not in acute distress. ?Cardiovascular:  ?   Rate and Rhythm: Normal rate and regular rhythm.  ?   Heart sounds: No murmur heard. ?Pulmonary:  ?   Effort: Pulmonary effort is normal.  ?   Breath sounds: Normal breath sounds.  ?Abdominal:  ?   General: There is no distension.  ?   Palpations: Abdomen is soft. There is no mass.  ?   Tenderness: There is no abdominal tenderness. There is no guarding.  ?Neurological:  ?   Mental Status: She is alert.  ?Psychiatric:     ?   Mood and Affect: Mood normal.     ?   Behavior: Behavior normal.     ?   Thought Content: Thought content normal.     ?   Judgment: Judgment normal.  ? ?Today's Vitals  ? 03/08/22 1450  ?BP: 137/83  ?Pulse: 70  ?Temp: (!) 97.3 ?F (36.3 ?C)  ?SpO2: 95%  ?Weight: 145 lb (65.8 kg)  ?Height: 4\' 9"  (1.448 m)  ? ?Body mass index is 31.38 kg/m?. ? ? ? ? ?   ?Assessment & Plan:  ? ?Problem List Items Addressed This Visit   ? ?  ? Digestive  ? GERD (gastroesophageal reflux disease) - Primary  ? Relevant Medications  ? RABEprazole (ACIPHEX) 20 MG tablet  ? ?Meds ordered this encounter  ?Medications  ? RABEprazole (ACIPHEX) 20 MG tablet  ?  Sig: Take 1 tablet (20 mg total) by mouth 2 (two) times daily before a meal.  ?  Dispense:  180 tablet  ?  Refill:  3  ?  Order Specific Question:   Supervising Provider  ?  Answer:    Sallee Lange A [[id number]]  ? ?Continue Aciphex as directed by GI specialist. ?Return for Follow up in May/June for physical. ? ? ?

## 2022-03-25 ENCOUNTER — Encounter: Payer: Self-pay | Admitting: Internal Medicine

## 2022-04-09 ENCOUNTER — Other Ambulatory Visit: Payer: Self-pay | Admitting: Nurse Practitioner

## 2022-04-16 NOTE — Telephone Encounter (Signed)
pantoprazole (PROTONIX) 40 MG tablet ?lansoprazole (PREVACID) 30 MG capsule ?esomeprazole (NEXIUM) 20 MG capsule ?omeprazole (PRILOSEC) 20 MG capsule ?RABEprazole (ACIPHEX) 20 MG tablet ? ?Insurance will only cover once a day dosing. ?

## 2022-04-19 ENCOUNTER — Other Ambulatory Visit: Payer: Self-pay | Admitting: Nurse Practitioner

## 2022-04-19 MED ORDER — PANTOPRAZOLE SODIUM 40 MG PO TBEC: 40.0000 mg | DELAYED_RELEASE_TABLET | Freq: Every day | ORAL | 0 refills | Status: DC

## 2022-04-19 NOTE — Telephone Encounter (Signed)
Unable to leave message. Mailbox is full ?

## 2022-04-29 NOTE — Telephone Encounter (Signed)
Patient says she has different insurance, she now has medicare.

## 2022-05-01 NOTE — Telephone Encounter (Signed)
Pt informed of md message and recommendations. Verbalized understanding.  Appointment made for wellness exam

## 2022-05-01 NOTE — Telephone Encounter (Signed)
Yes ma'am! 

## 2022-05-22 DIAGNOSIS — Z0100 Encounter for examination of eyes and vision without abnormal findings: Secondary | ICD-10-CM | POA: Diagnosis not present

## 2022-05-22 DIAGNOSIS — H524 Presbyopia: Secondary | ICD-10-CM | POA: Diagnosis not present

## 2022-05-24 ENCOUNTER — Ambulatory Visit (INDEPENDENT_AMBULATORY_CARE_PROVIDER_SITE_OTHER): Payer: Medicare HMO | Admitting: Nurse Practitioner

## 2022-05-24 VITALS — BP 119/70 | HR 85 | Temp 97.5°F | Ht <= 58 in | Wt 143.0 lb

## 2022-05-24 DIAGNOSIS — Z0000 Encounter for general adult medical examination without abnormal findings: Secondary | ICD-10-CM | POA: Diagnosis not present

## 2022-05-24 DIAGNOSIS — Z01419 Encounter for gynecological examination (general) (routine) without abnormal findings: Secondary | ICD-10-CM | POA: Diagnosis not present

## 2022-05-24 NOTE — Patient Instructions (Addendum)
[facility] [phone]

## 2022-05-24 NOTE — Progress Notes (Unsigned)
Subjective:    Patient ID: Patricia [name], female    DOB: [birth year], 76 y.o.   MRN: [id number]  HPI AWV- Annual Wellness Visit  The patient was seen for their annual wellness visit. The patient's past medical history, surgical history, and family history were reviewed. Pertinent vaccines were reviewed ( tetanus, pneumonia, shingles, flu) The patient's medication list was reviewed and updated.  The height and weight were entered.  BMI recorded in electronic record elsewhere  Cognitive screening was completed. Outcome of Mini - Cog: 3   Falls /depression screening electronically recorded within record elsewhere  Current tobacco usage:NO (All patients who use tobacco were given written and verbal information on quitting)  Recent listing of emergency department/hospitalizations over the past year were reviewed.  current specialist the patient sees on a regular basis:    Medicare annual wellness visit patient questionnaire was reviewed.  A written screening schedule for the patient for the next 5-10 years was given. Appropriate discussion of followup regarding next visit was discussed. Married, same sexual partner.  Regular exercise 5 days a week.  Regular vision and dental exams.  Patient plans to get her mammogram done through the mobile unit at work in August.  Has had COVID tetanus vaccine and shingles vaccine. Due to changes in insurance she was placed on pantoprazole which is not working as well as the [employer].  This was unimproved during prior authorization.   Continues to work which has helped her greatly mentally. Of note patient has had some difficulty getting her medications through her pharmacy.     Review of Systems  Constitutional:  Negative for activity change, appetite change and fatigue.  HENT:  Negative for sore throat and trouble swallowing.   Respiratory:  Negative for cough, chest tightness, shortness of breath and wheezing.   Cardiovascular:  Negative  for chest pain.  Gastrointestinal:  Negative for abdominal distention, abdominal pain, constipation, diarrhea, nausea and vomiting.  Genitourinary:  Negative for difficulty urinating, dysuria, enuresis, frequency, genital sores, menstrual problem, pelvic pain, urgency and vaginal discharge.      05/24/2022    3:11 PM  Depression screen PHQ 2/9  Decreased Interest 0  Down, Depressed, Hopeless 0  PHQ - 2 Score 0        Objective:   Physical Exam Constitutional:      General: She is not in acute distress.    Appearance: She is well-developed.  Neck:     Thyroid: No thyromegaly.     Trachea: No tracheal deviation.     Comments: Thyroid non tender to palpation. No mass or goiter noted.  Cardiovascular:     Rate and Rhythm: Normal rate and regular rhythm.     Heart sounds: Normal heart sounds. No murmur heard. Pulmonary:     Effort: Pulmonary effort is normal.     Breath sounds: Normal breath sounds.  Chest:  Breasts:    Right: No swelling, inverted nipple, mass, skin change or tenderness.     Left: No swelling, inverted nipple, mass, skin change or tenderness.  Abdominal:     General: There is no distension.     Palpations: Abdomen is soft.     Tenderness: There is no abdominal tenderness.  Genitourinary:    Comments: Defers GU exam.  States she does have some vaginal bulging at times but defers any intervention at this point. Musculoskeletal:     Cervical back: Normal range of motion and neck supple.  Lymphadenopathy:     Cervical:  No cervical adenopathy.     Upper Body:     Right upper body: No supraclavicular, axillary or pectoral adenopathy.     Left upper body: No supraclavicular, axillary or pectoral adenopathy.  Skin:    General: Skin is warm and dry.     Findings: No rash.  Neurological:     Mental Status: She is alert and oriented to person, place, and time.  Psychiatric:        Mood and Affect: Mood normal.        Behavior: Behavior normal.        Thought  Content: Thought content normal.        Judgment: Judgment normal.    Today's Vitals   05/24/22 1514  BP: 119/70  Pulse: 85  Temp: (!) 97.5 F (36.4 C)  SpO2: 96%  Weight: 143 lb (64.9 kg)  Height: 4\' 9"  (1.448 m)   Body mass index is 30.94 kg/m.        Assessment & Plan:  Encounter for Medicare annual wellness exam  Well woman exam  [facility]. Continue healthy lifestyle habits. We will again attempt to get Dexilant approved through her insurance. Mammogram through her mobile unit at work in August. Recommend bone density late August early September. Return in about 6 months (around 11/23/2022).

## 2022-05-25 ENCOUNTER — Encounter: Payer: Self-pay | Admitting: Nurse Practitioner

## 2022-06-18 DIAGNOSIS — J069 Acute upper respiratory infection, unspecified: Secondary | ICD-10-CM | POA: Diagnosis not present

## 2022-06-18 DIAGNOSIS — J019 Acute sinusitis, unspecified: Secondary | ICD-10-CM | POA: Diagnosis not present

## 2022-07-05 ENCOUNTER — Encounter: Payer: Self-pay | Admitting: Nurse Practitioner

## 2022-07-05 ENCOUNTER — Ambulatory Visit (INDEPENDENT_AMBULATORY_CARE_PROVIDER_SITE_OTHER): Payer: Medicare HMO | Admitting: Nurse Practitioner

## 2022-07-05 VITALS — BP 138/80 | HR 69 | Temp 97.3°F | Wt 145.6 lb

## 2022-07-05 DIAGNOSIS — F419 Anxiety disorder, unspecified: Secondary | ICD-10-CM

## 2022-07-05 DIAGNOSIS — B9689 Other specified bacterial agents as the cause of diseases classified elsewhere: Secondary | ICD-10-CM | POA: Diagnosis not present

## 2022-07-05 DIAGNOSIS — F32A Depression, unspecified: Secondary | ICD-10-CM | POA: Diagnosis not present

## 2022-07-05 DIAGNOSIS — J069 Acute upper respiratory infection, unspecified: Secondary | ICD-10-CM

## 2022-07-05 DIAGNOSIS — R69 Illness, unspecified: Secondary | ICD-10-CM | POA: Diagnosis not present

## 2022-07-05 MED ORDER — AZITHROMYCIN 250 MG PO TABS: ORAL_TABLET | ORAL | 0 refills | Status: DC

## 2022-07-05 MED ORDER — BUPROPION HCL ER (XL) 150 MG PO TB24: 150.0000 mg | ORAL_TABLET | Freq: Every day | ORAL | 0 refills | Status: DC

## 2022-07-05 NOTE — Progress Notes (Unsigned)
   Subjective:    Patient ID: Patricia [name], female    DOB: Jan 12, 1946, 76 y.o.   MRN: [id number]  HPI Pt here to discuss medications. Pt is wondering if she needs to change Paxil ; pt states she feels it is not working any longer. Pt states when she gets home she has to make her self do stuff. Pt also have lingering congestion. Went to Urgent Care on 06/18/22 and was prescribed Amoxicillin.    Review of Systems     Objective:   Physical Exam        Assessment & Plan:

## 2022-07-06 ENCOUNTER — Encounter: Payer: Self-pay | Admitting: Nurse Practitioner

## 2022-07-27 ENCOUNTER — Other Ambulatory Visit: Payer: Self-pay | Admitting: Nurse Practitioner

## 2022-08-02 ENCOUNTER — Ambulatory Visit (INDEPENDENT_AMBULATORY_CARE_PROVIDER_SITE_OTHER): Payer: Medicare HMO | Admitting: Nurse Practitioner

## 2022-08-02 VITALS — BP 142/92 | HR 67 | Temp 97.7°F | Ht <= 58 in | Wt 143.0 lb

## 2022-08-02 DIAGNOSIS — F341 Dysthymic disorder: Secondary | ICD-10-CM

## 2022-08-02 DIAGNOSIS — R7301 Impaired fasting glucose: Secondary | ICD-10-CM | POA: Diagnosis not present

## 2022-08-02 DIAGNOSIS — F32A Depression, unspecified: Secondary | ICD-10-CM | POA: Diagnosis not present

## 2022-08-02 DIAGNOSIS — E782 Mixed hyperlipidemia: Secondary | ICD-10-CM

## 2022-08-02 DIAGNOSIS — F419 Anxiety disorder, unspecified: Secondary | ICD-10-CM | POA: Diagnosis not present

## 2022-08-02 DIAGNOSIS — R69 Illness, unspecified: Secondary | ICD-10-CM | POA: Diagnosis not present

## 2022-08-02 NOTE — Progress Notes (Unsigned)
Subjective:    Patient ID: Patricia [name], female    DOB: 29-May-1946, 76 y.o.   MRN: [id number]  HPI 1 month follow up for anxiety and depression  Did not see any improvement on the bupropion XL 150 mg daily.  Continues to experience extreme fatigue lack of interest in activities and social interaction.  States she is lacking motivation to do anything.  Does continue to work.  Denies any suicidal or homicidal thoughts or ideation.  Has an appointment in September for counseling that is provided through her job. Of note her URI symptoms and cough are much improved since previous visit.  Review of Systems  Constitutional:  Positive for fatigue.  Respiratory:  Negative for cough, chest tightness and shortness of breath.   Cardiovascular:  Negative for chest pain and palpitations.  Psychiatric/Behavioral:  Positive for dysphoric mood and sleep disturbance. Negative for suicidal ideas. The patient is nervous/anxious.       08/02/2022   11:47 AM  Depression screen PHQ 2/9  Decreased Interest 3  Down, Depressed, Hopeless 3  PHQ - 2 Score 6  Altered sleeping 2  Tired, decreased energy 3  Change in appetite 3  Feeling bad or failure about yourself  2  Trouble concentrating 2  Moving slowly or fidgety/restless 0  Suicidal thoughts 0  PHQ-9 Score 18  Difficult doing work/chores Extremely dIfficult      07/05/2022   10:16 AM 09/15/2018    3:46 PM  GAD 7 : Generalized Anxiety Score  Nervous, Anxious, on Edge 3 2  Control/stop worrying 3 2  Worry too much - different things 3 2  Trouble relaxing 2 2  Restless 0 1  Easily annoyed or irritable 3 1  Afraid - awful might happen 3 1  Total GAD 7 Score 17 11  Anxiety Difficulty Very difficult Extremely difficult        Objective:   Physical Exam NAD.  Alert, oriented.  Calm affect, tearful at times.  Making good eye contact.  Dressed appropriately for the weather.  Good hygiene.  Speech clear.  Thoughts logical coherent and relevant.   Thyroid nontender to palpation, no mass or goiter noted.  Lungs clear.  Heart regular rate rhythm. Today's Vitals   08/02/22 1002 08/02/22 1013  BP: (!) 168/96 (!) 142/92  Pulse: 67   Temp: 97.7 F (36.5 C)   SpO2: 97%   Weight: 143 lb (64.9 kg)   Height: 4\' 9"  (1.448 m)    Body mass index is 30.94 kg/m.        Assessment & Plan:   Problem List Items Addressed This Visit       Endocrine   Impaired fasting glucose   Relevant Orders   CBC with Differential/Platelet (Completed)   Comprehensive metabolic panel (Completed)   Lipid panel (Completed)   TSH (Completed)     Other   Hyperlipemia (Chronic)   Relevant Orders   CBC with Differential/Platelet (Completed)   Comprehensive metabolic panel (Completed)   Lipid panel (Completed)   TSH (Completed)   Anxiety and depression - Primary   Relevant Orders   CBC with Differential/Platelet (Completed)   Comprehensive metabolic panel (Completed)   Lipid panel (Completed)   TSH (Completed)   Persistent depressive disorder, moderate   Meds ordered this encounter  Medications   ARIPiprazole (ABILIFY) 2 MG tablet    Sig: Take 1 tablet (2 mg total) by mouth daily.    Dispense:  30 tablet    Refill:  0    Order Specific Question:   Supervising Provider    Answer:   [name] [[id number]]   Because she has been on the Paxil 40 mg long-term, reluctant to start weaning her at this point.  Note that this has worked extremely well up until recently.  We will add a low-dose Abilify for persistent depressive symptoms.  Patient to discontinue medication and contact office if any adverse effects.  Routine labs ordered.  Recommend she follow-up with counselor in September as planned.  She verbally agrees to seek help immediately if any suicidal or homicidal thoughts or ideation. Return in about 1 month (around 09/02/2022). Recommend flu vaccine this fall.

## 2022-08-03 ENCOUNTER — Encounter: Payer: Self-pay | Admitting: Nurse Practitioner

## 2022-08-03 DIAGNOSIS — F341 Dysthymic disorder: Secondary | ICD-10-CM | POA: Insufficient documentation

## 2022-08-03 LAB — CBC WITH DIFFERENTIAL/PLATELET
Basophils Absolute: 0.2 10*3/uL (ref 0.0–0.2)
Basos: 2 %
EOS (ABSOLUTE): 0.3 10*3/uL (ref 0.0–0.4)
Eos: 4 %
Hematocrit: 41 % (ref 34.0–46.6)
Hemoglobin: 13.3 g/dL (ref 11.1–15.9)
Immature Grans (Abs): 0 10*3/uL (ref 0.0–0.1)
Immature Granulocytes: 0 %
Lymphocytes Absolute: 2.4 10*3/uL (ref 0.7–3.1)
Lymphs: 28 %
MCH: 28.3 pg (ref 26.6–33.0)
MCHC: 32.4 g/dL (ref 31.5–35.7)
MCV: 87 fL (ref 79–97)
Monocytes Absolute: 0.6 10*3/uL (ref 0.1–0.9)
Monocytes: 7 %
Neutrophils Absolute: 5 10*3/uL (ref 1.4–7.0)
Neutrophils: 59 %
Platelets: 291 10*3/uL (ref 150–450)
RBC: 4.7 x10E6/uL (ref 3.77–5.28)
RDW: 12.1 % (ref 11.7–15.4)
WBC: 8.5 10*3/uL (ref 3.4–10.8)

## 2022-08-03 LAB — COMPREHENSIVE METABOLIC PANEL
ALT: 33 IU/L — ABNORMAL HIGH (ref 0–32)
AST: 40 IU/L (ref 0–40)
Albumin/Globulin Ratio: 2.2 (ref 1.2–2.2)
Albumin: 4.8 g/dL (ref 3.8–4.8)
Alkaline Phosphatase: 94 IU/L (ref 44–121)
BUN/Creatinine Ratio: 19 (ref 12–28)
BUN: 16 mg/dL (ref 8–27)
Bilirubin Total: 0.3 mg/dL (ref 0.0–1.2)
CO2: 24 mmol/L (ref 20–29)
Calcium: 9.9 mg/dL (ref 8.7–10.3)
Chloride: 100 mmol/L (ref 96–106)
Creatinine, Ser: 0.86 mg/dL (ref 0.57–1.00)
Globulin, Total: 2.2 g/dL (ref 1.5–4.5)
Glucose: 93 mg/dL (ref 70–99)
Potassium: 4 mmol/L (ref 3.5–5.2)
Sodium: 139 mmol/L (ref 134–144)
Total Protein: 7 g/dL (ref 6.0–8.5)
eGFR: 71 mL/min/{1.73_m2} (ref >59)

## 2022-08-03 LAB — LIPID PANEL
Chol/HDL Ratio: 2.4 ratio (ref 0.0–4.4)
Cholesterol, Total: 198 mg/dL (ref 100–199)
HDL: 83 mg/dL (ref >39)
LDL Chol Calc (NIH): 101 mg/dL — ABNORMAL HIGH (ref 0–99)
Triglycerides: 75 mg/dL (ref 0–149)
VLDL Cholesterol Cal: 14 mg/dL (ref 5–40)

## 2022-08-03 LAB — TSH: TSH: 2.83 u[IU]/mL (ref 0.450–4.500)

## 2022-08-03 MED ORDER — ARIPIPRAZOLE 2 MG PO TABS: 2.0000 mg | ORAL_TABLET | Freq: Every day | ORAL | 0 refills | Status: DC

## 2022-08-04 NOTE — Progress Notes (Unsigned)
GI Office Note    Referring Provider: [name], MD Primary Care Physician:  [name], MD  Primary Gastroenterologist: Garfield Cornea, MD   Chief Complaint   No chief complaint on file.   History of Present Illness   Patricia [name] is a 76 y.o. female presenting today for follow up GERD.     EGD 09/2021: normal. S/p esophageal dilation.   Colonoscopy 03/2017: normal. Due to age, no future colonoscopies unless new symptoms.  Medications   Current Outpatient Medications  Medication Sig Dispense Refill   ALPRAZolam (XANAX) 0.5 MG tablet TAKE (1) TABLET BY MOUTH AT BEDTIME AS NEEDED FOR SLEEP. 30 tablet 2   ARIPiprazole (ABILIFY) 2 MG tablet Take 1 tablet (2 mg total) by mouth daily. 30 tablet 0   b complex vitamins capsule Take 1 capsule by mouth daily.     Calcium Carb-Cholecalciferol (CALCIUM 600 + D PO) Take 1 tablet by mouth at bedtime.      Cholecalciferol (VITAMIN D3) 125 MCG (5000 UT) CAPS Take 5,000 Units by mouth daily.     COLLAGEN PO Take 2 each by mouth daily. Chewables     fluticasone (FLONASE) 50 MCG/ACT nasal spray Place 2 sprays into both nostrils daily. 48 g 1   meclizine (ANTIVERT) 25 MG tablet Take one tablet po TID prn dizziness 30 tablet 0   Multiple Vitamin (MULTIVITAMIN WITH MINERALS) TABS tablet Take 1 tablet by mouth daily.     naproxen sodium (ALEVE) 220 MG tablet Take 220 mg by mouth daily as needed (pain).     pantoprazole (PROTONIX) 40 MG tablet Take 1 tablet (40 mg total) by mouth daily. For acid reflux 90 tablet 0   PARoxetine (PAXIL) 40 MG tablet Take 1 tablet (40 mg total) by mouth every morning. 90 tablet 1   rosuvastatin (CRESTOR) 20 MG tablet Take 1 tablet (20 mg total) by mouth daily. 90 tablet 1   No current facility-administered medications for this visit.    Allergies   Allergies as of 08/05/2022 - Review Complete 08/03/2022  Allergen Reaction Noted   Codeine Nausea Only 09/11/2011     Past Medical History    Past Medical History:  Diagnosis Date   Depression    GERD (gastroesophageal reflux disease)    Hypercholesterolemia    IFG (impaired fasting glucose)    Osteopenia    PONV (postoperative nausea and vomiting)     Past Surgical History   Past Surgical History:  Procedure Laterality Date   ABDOMINAL HYSTERECTOMY     CATARACT EXTRACTION W/PHACO  09/12/2011   Procedure: CATARACT EXTRACTION PHACO AND INTRAOCULAR LENS PLACEMENT ([place]);  Surgeon: Tonny Branch;  Location: [facility];  Service: Ophthalmology;  Laterality: Left;  CDE: 8.91   CATARACT EXTRACTION W/PHACO  11/18/2011   Procedure: CATARACT EXTRACTION PHACO AND INTRAOCULAR LENS PLACEMENT ([facility]);  Surgeon: Tonny Branch;  Location: [facility];  Service: Ophthalmology;  Laterality: Right;  CDE:10.26   COLONOSCOPY  2007   Dr. Gala Romney: internal hemorrhoids, single anal papilla poor prep.    COLONOSCOPY N/A 03/27/2017   Rourk: Normal exam.   ECTOPIC PREGNANCY SURGERY     removal of right tube   ESOPHAGOGASTRODUODENOSCOPY (EGD) WITH PROPOFOL N/A 09/10/2021   Procedure: ESOPHAGOGASTRODUODENOSCOPY (EGD) WITH PROPOFOL;  Surgeon: Daneil Dolin, MD;  Location: [facility];  Service: Endoscopy;  Laterality: N/A;  8:15AM   MALONEY DILATION N/A 09/10/2021   Procedure: Venia Minks DILATION;  Surgeon: Daneil Dolin, MD;  Location:  [facility];  Service: Endoscopy;  Laterality: N/A;   OVARIAN CYST REMOVAL     right   RECTOCELE REPAIR N/A 01/18/2014   Procedure: POSTERIOR REPAIR (RECTOCELE);  Surgeon: Jonnie Kind, MD;  Location: [facility];  Service: Gynecology;  Laterality: N/A;   VAGINAL HYSTERECTOMY N/A 01/18/2014   Procedure: HYSTERECTOMY VAGINAL;  Surgeon: Jonnie Kind, MD;  Location: [facility];  Service: Gynecology;  Laterality: N/A;    Past Family History   Family History  Problem Relation Age of Onset   Heart attack Father    Osteoporosis Mother    Heart attack Brother 36       MI   Breast cancer Maternal Aunt    Breast cancer Paternal  Aunt    Cancer Paternal Aunt        ovarian cancer   Anesthesia problems Neg Hx    Hypotension Neg Hx    Malignant hyperthermia Neg Hx    Pseudochol deficiency Neg Hx    Colon cancer Neg Hx     Past Social History   Social History   Socioeconomic History   Marital status: Married    Spouse name: Not on file   Number of children: Not on file   Years of education: Not on file   Highest education level: Not on file  Occupational History   Not on file  Tobacco Use   Smoking status: Former    Packs/day: 1.00    Years: 20.00    Total pack years: 20.00    Types: Cigarettes    Quit date: 09/10/1984    Years since quitting: 37.9   Smokeless tobacco: Never  Substance and Sexual Activity   Alcohol use: No   Drug use: No   Sexual activity: Not Currently    Birth control/protection: Post-menopausal  Other Topics Concern   Not on file  Social History Narrative   Not on file   Social Determinants of Health   Financial Resource Strain: Not on file  Food Insecurity: Not on file  Transportation Needs: Not on file  Physical Activity: Not on file  Stress: Not on file  Social Connections: Not on file  Intimate Partner Violence: Not on file    Review of Systems   General: Negative for anorexia, weight loss, fever, chills, fatigue, weakness. ENT: Negative for hoarseness, difficulty swallowing , nasal congestion. CV: Negative for chest pain, angina, palpitations, dyspnea on exertion, peripheral edema.  Respiratory: Negative for dyspnea at rest, dyspnea on exertion, cough, sputum, wheezing.  GI: See history of present illness. GU:  Negative for dysuria, hematuria, urinary incontinence, urinary frequency, nocturnal urination.  Endo: Negative for unusual weight change.     Physical Exam   There were no vitals taken for this visit.   General: Well-nourished, well-developed in no acute distress.  Eyes: No icterus. Mouth: Oropharyngeal mucosa moist and pink , no lesions erythema  or exudate. Lungs: Clear to auscultation bilaterally.  Heart: Regular rate and rhythm, no murmurs rubs or gallops.  Abdomen: Bowel sounds are normal, nontender, nondistended, no hepatosplenomegaly or masses,  no abdominal bruits or hernia , no rebound or guarding.  Rectal: ***  Extremities: No lower extremity edema. No clubbing or deformities. Neuro: Alert and oriented x 4   Skin: Warm and dry, no jaundice.   Psych: Alert and cooperative, normal mood and affect.  Labs   Lab Results  Component Value Date   TSH 2.830 08/02/2022   Lab Results  Component Value Date  CREATININE 0.86 08/02/2022   BUN 16 08/02/2022   NA 139 08/02/2022   K 4.0 08/02/2022   CL 100 08/02/2022   CO2 24 08/02/2022   Lab Results  Component Value Date   ALT 33 (H) 08/02/2022   AST 40 08/02/2022   ALKPHOS 94 08/02/2022   BILITOT 0.3 08/02/2022   Lab Results  Component Value Date   WBC 8.5 08/02/2022   HGB 13.3 08/02/2022   HCT 41.0 08/02/2022   MCV 87 08/02/2022   PLT 291 08/02/2022    Imaging Studies   No results found.  Assessment       PLAN   ***   Laureen Ochs. Bobby Rumpf, [place], [facility]

## 2022-08-05 ENCOUNTER — Encounter: Payer: Self-pay | Admitting: Gastroenterology

## 2022-08-05 ENCOUNTER — Ambulatory Visit (INDEPENDENT_AMBULATORY_CARE_PROVIDER_SITE_OTHER): Payer: Medicare HMO | Admitting: Gastroenterology

## 2022-08-05 VITALS — BP 158/102 | HR 77 | Temp 97.8°F | Ht <= 58 in | Wt 144.6 lb

## 2022-08-05 DIAGNOSIS — K219 Gastro-esophageal reflux disease without esophagitis: Secondary | ICD-10-CM

## 2022-08-05 DIAGNOSIS — K449 Diaphragmatic hernia without obstruction or gangrene: Secondary | ICD-10-CM | POA: Diagnosis not present

## 2022-08-05 MED ORDER — DEXLANSOPRAZOLE 60 MG PO CPDR: 60.0000 mg | DELAYED_RELEASE_CAPSULE | Freq: Every day | ORAL | 3 refills | Status: DC

## 2022-08-05 NOTE — Patient Instructions (Addendum)
Take pantoprazole 40mg  twice daily, before breakfast and evening meal for now. We will work to see if [employer] will cover [place].  Check your blood pressure at home several times per week. If you are having blood pressures consistently over 130/90, please let Dr. Wolfgang Phoenix know.  Return office visit in one year. If your reflux symptoms do not improve, would recommend return office visit sooner.   Hiatal Hernia  A hiatal hernia occurs when part of the stomach slides above the muscle that separates the abdomen from the chest (diaphragm). A person can be born with a hiatal hernia (congenital), or it may develop over time. In almost all cases of hiatal hernia, only the top part of the stomach pushes through the diaphragm. Many people have a hiatal hernia with no symptoms. The larger the hernia, the more likely it is that you will have symptoms. In some cases, a hiatal hernia allows stomach acid to flow back into the tube that carries food from your mouth to your stomach (esophagus). This may cause heartburn symptoms. The development of heartburn symptoms may mean that you have a condition called gastroesophageal reflux disease (GERD). What are the causes? This condition is caused by a weakness in the opening (hiatus) where the esophagus passes through the diaphragm to attach to the upper part of the stomach. A person may be born with a weakness in the hiatus, or a weakness can develop over time. What increases the risk? This condition is more likely to develop in: Older people. Age is a major risk factor for a hiatal hernia, especially if you are over the age of 28. Pregnant women. People who are overweight. People who have frequent constipation. What are the signs or symptoms? Symptoms of this condition usually develop in the form of GERD symptoms. Symptoms include: Heartburn. Upset stomach (indigestion). Trouble swallowing. Coughing or wheezing. Wheezing is making high-pitched whistling  sounds when you breathe. Sore throat. Chest pain. Nausea and vomiting. How is this diagnosed? This condition may be diagnosed during testing for GERD. Tests that may be done include: X-rays of your stomach or chest. An upper gastrointestinal (GI) series. This is an X-ray exam of your GI tract that is taken after you swallow a chalky liquid that shows up clearly on the X-ray. Endoscopy. This is a procedure to look into your stomach using a thin, flexible tube that has a tiny camera and light on the end of it. How is this treated? This condition may be treated by: Dietary and lifestyle changes to help reduce GERD symptoms. Medicines. These may include: Over-the-counter antacids. Medicines that make your stomach empty more quickly. Medicines that block the production of stomach acid (H2 blockers). Stronger medicines to reduce stomach acid (proton pump inhibitors). Surgery to repair the hernia, if other treatments are not helping. If you have no symptoms, you may not need treatment. Follow these instructions at home: Lifestyle and activity Do not use any products that contain nicotine or tobacco. These products include cigarettes, chewing tobacco, and vaping devices, such as e-cigarettes. If you need help quitting, ask your health care provider. Try to achieve and maintain a healthy body weight. Avoid putting pressure on your abdomen. Anything that puts pressure on your abdomen increases the amount of acid that may be pushed up into your esophagus. Avoid bending over, especially after eating. Raise the head of your bed by putting blocks under the legs. This keeps your head and esophagus higher than your stomach. Do not wear tight clothing  around your chest or stomach. Try not to strain when having a bowel movement, when urinating, or when lifting heavy objects. Eating and drinking Avoid foods that can worsen GERD symptoms. These may include: Fatty foods, like fried foods. Citrus fruits,  like oranges or lemon. Other foods and drinks that contain acid, like orange juice or tomatoes. Spicy food. Chocolate. Eat frequent small meals instead of three large meals a day. This helps prevent your stomach from getting too full. Eat slowly. Do not lie down right after eating. Do not eat 1-2 hours before bed. Do not drink beverages with caffeine. These include cola, coffee, cocoa, and tea. Do not drink alcohol. General instructions Take over-the-counter and prescription medicines only as told by your health care provider. Keep all follow-up visits. Your health care provider will want to check that any new prescribed medicines are helping your symptoms. Contact a health care provider if: Your symptoms are not controlled with medicines or lifestyle changes. You are having trouble swallowing. You have coughing or wheezing that will not go away. Your pain is getting worse. Your pain spreads to your arms, neck, jaw, teeth, or back. You feel nauseous or you vomit. Get help right away if: You have shortness of breath. You vomit blood. You have bright red blood in your stools. You have black, tarry stools. These symptoms may be an emergency. Get help right away. Call 911. Do not wait to see if the symptoms will go away. Do not drive yourself to the hospital. Summary A hiatal hernia occurs when part of the stomach slides above the muscle that separates the abdomen from the chest. A person may be born with a weakness in the hiatus, or a weakness can develop over time. Symptoms of a hiatal hernia may include heartburn, trouble swallowing, or sore throat. Management of a hiatal hernia includes eating frequent small meals instead of three large meals a day. Get help right away if you vomit blood, have bright red blood in your stools, or have black, tarry stools. This information is not intended to replace advice given to you by your health care provider. Make sure you discuss any questions  you have with your health care provider. Document Revised: 01/22/2022 Document Reviewed: 01/22/2022 Elsevier Patient Education  [place] for Gastroesophageal Reflux Disease, Adult When you have gastroesophageal reflux disease (GERD), the foods you eat and your eating habits are very important. Choosing the right foods can help ease the discomfort of GERD. Consider working with a dietitian to help you make healthy food choices. What are tips for following this plan? Reading food labels Look for foods that are low in saturated fat. Foods that have less than 5% of daily value (DV) of fat and 0 g of trans fats may help with your symptoms. Cooking Cook foods using methods other than frying. This may include baking, steaming, grilling, or broiling. These are all methods that do not need a lot of fat for cooking. To add flavor, try to use herbs that are low in spice and acidity. Meal planning  Choose healthy foods that are low in fat, such as fruits, vegetables, whole grains, low-fat dairy products, lean meats, fish, and poultry. Eat frequent, small meals instead of three large meals each day. Eat your meals slowly, in a relaxed setting. Avoid bending over or lying down until 2-3 hours after eating. Limit high-fat foods such as fatty meats or fried foods. Limit your intake of fatty foods, such as oils,  butter, and shortening. Avoid the following as told by your health care provider: Foods that cause symptoms. These may be different for different people. Keep a food diary to keep track of foods that cause symptoms. Alcohol. Drinking large amounts of liquid with meals. Eating meals during the 2-3 hours before bed. Lifestyle Maintain a healthy weight. Ask your health care provider what weight is healthy for you. If you need to lose weight, work with your health care provider to do so safely. Exercise for at least 30 minutes on 5 or more days each week, or as told by your  health care provider. Avoid wearing clothes that fit tightly around your waist and chest. Do not use any products that contain nicotine or tobacco. These products include cigarettes, chewing tobacco, and vaping devices, such as e-cigarettes. If you need help quitting, ask your health care provider. Sleep with the head of your bed raised. Use a wedge under the mattress or blocks under the bed frame to raise the head of the bed. Chew sugar-free gum after mealtimes. What foods should I eat?  Eat a healthy, well-balanced diet of fruits, vegetables, whole grains, low-fat dairy products, lean meats, fish, and poultry. Each person is different. Foods that may trigger symptoms in one person may not trigger any symptoms in another person. Work with your health care provider to identify foods that are safe for you. The items listed above may not be a complete list of recommended foods and beverages. Contact a dietitian for more information. What foods should I avoid? Limiting some of these foods may help manage the symptoms of GERD. Everyone is different. Consult a dietitian or your health care provider to help you identify the exact foods to avoid, if any. Fruits Any fruits prepared with added fat. Any fruits that cause symptoms. For some people this may include citrus fruits, such as oranges, grapefruit, pineapple, and lemons. Vegetables Deep-fried vegetables. [place] fries. Any vegetables prepared with added fat. Any vegetables that cause symptoms. For some people, this may include tomatoes and tomato products, chili peppers, onions and garlic, and horseradish. Grains Pastries or quick breads with added fat. Meats and other proteins High-fat meats, such as fatty beef or pork, hot dogs, ribs, ham, sausage, salami, and bacon. Fried meat or protein, including fried fish and fried chicken. Nuts and nut butters, in large amounts. Dairy Whole milk and chocolate milk. Sour cream. Cream. Ice cream. Cream cheese.  Milkshakes. Fats and oils Butter. Margarine. Shortening. Ghee. Beverages Coffee and tea, with or without caffeine. Carbonated beverages. Sodas. Energy drinks. Fruit juice made with acidic fruits, such as orange or grapefruit. Tomato juice. Alcoholic drinks. Sweets and desserts Chocolate and cocoa. Donuts. Seasonings and condiments Pepper. Peppermint and spearmint. Added salt. Any condiments, herbs, or seasonings that cause symptoms. For some people, this may include curry, hot sauce, or vinegar-based salad dressings. The items listed above may not be a complete list of foods and beverages to avoid. Contact a dietitian for more information. Questions to ask your health care provider Diet and lifestyle changes are usually the first steps that are taken to manage symptoms of GERD. If diet and lifestyle changes do not improve your symptoms, talk with your health care provider about taking medicines. Where to find more information [employer] for Gastrointestinal Disorders: aboutgerd.org Summary When you have gastroesophageal reflux disease (GERD), food and lifestyle choices may be very helpful in easing the discomfort of GERD. Eat frequent, small meals instead of three large meals each day.  Eat your meals slowly, in a relaxed setting. Avoid bending over or lying down until 2-3 hours after eating. Limit high-fat foods such as fatty meats or fried foods. This information is not intended to replace advice given to you by your health care provider. Make sure you discuss any questions you have with your health care provider. Document Revised: 06/05/2020 Document Reviewed: 06/05/2020 Elsevier Patient Education  [place].  Gastroesophageal Reflux Disease, Adult Gastroesophageal reflux (GER) happens when acid from the stomach flows up into the tube that connects the mouth and the stomach (esophagus). Normally, food travels down the esophagus and stays in the stomach to be digested.  However, when a person has GER, food and stomach acid sometimes move back up into the esophagus. If this becomes a more serious problem, the person may be diagnosed with a disease called gastroesophageal reflux disease (GERD). GERD occurs when the reflux: Happens often. Causes frequent or severe symptoms. Causes problems such as damage to the esophagus. When stomach acid comes in contact with the esophagus, the acid may cause inflammation in the esophagus. Over time, GERD may create small holes (ulcers) in the lining of the esophagus. What are the causes? This condition is caused by a problem with the muscle between the esophagus and the stomach (lower esophageal sphincter, or LES). Normally, the LES muscle closes after food passes through the esophagus to the stomach. When the LES is weakened or abnormal, it does not close properly, and that allows food and stomach acid to go back up into the esophagus. The LES can be weakened by certain dietary substances, medicines, and medical conditions, including: Tobacco use. Pregnancy. Having a hiatal hernia. Alcohol use. Certain foods and beverages, such as coffee, chocolate, onions, and peppermint. What increases the risk? You are more likely to develop this condition if you: Have an increased body weight. Have a connective tissue disorder. Take NSAIDs, such as ibuprofen. What are the signs or symptoms? Symptoms of this condition include: Heartburn. Difficult or painful swallowing and the feeling of having a lump in the throat. A bitter taste in the mouth. Bad breath and having a large amount of saliva. Having an upset or bloated stomach and belching. Chest pain. Different conditions can cause chest pain. Make sure you see your health care provider if you experience chest pain. Shortness of breath or wheezing. Ongoing (chronic) cough or a nighttime cough. Wearing away of tooth enamel. Weight loss. How is this diagnosed? This condition may be  diagnosed based on a medical history and a physical exam. To determine if you have mild or severe GERD, your health care provider may also monitor how you respond to treatment. You may also have tests, including: A test to examine your stomach and esophagus with a small camera (endoscopy). A test that measures the acidity level in your esophagus. A test that measures how much pressure is on your esophagus. A barium swallow or modified barium swallow test to show the shape, size, and functioning of your esophagus. How is this treated? Treatment for this condition may vary depending on how severe your symptoms are. Your health care provider may recommend: Changes to your diet. Medicine. Surgery. The goal of treatment is to help relieve your symptoms and to prevent complications. Follow these instructions at home: Eating and drinking  Follow a diet as recommended by your health care provider. This may involve avoiding foods and drinks such as: Coffee and tea, with or without caffeine. Drinks that contain alcohol. Energy drinks  and sports drinks. Carbonated drinks or sodas. Chocolate and cocoa. Peppermint and mint flavorings. Garlic and onions. Horseradish. Spicy and acidic foods, including peppers, chili powder, curry powder, vinegar, hot sauces, and barbecue sauce. Citrus fruit juices and citrus fruits, such as oranges, lemons, and limes. Tomato-based foods, such as red sauce, chili, salsa, and pizza with red sauce. Fried and fatty foods, such as donuts, french fries, potato chips, and high-fat dressings. High-fat meats, such as hot dogs and fatty cuts of red and white meats, such as rib eye steak, sausage, ham, and bacon. High-fat dairy items, such as whole milk, butter, and cream cheese. Eat small, frequent meals instead of large meals. Avoid drinking large amounts of liquid with your meals. Avoid eating meals during the 2-3 hours before bedtime. Avoid lying down right after you  eat. Do not exercise right after you eat. Lifestyle  Do not use any products that contain nicotine or tobacco. These products include cigarettes, chewing tobacco, and vaping devices, such as e-cigarettes. If you need help quitting, ask your health care provider. Try to reduce your stress by using methods such as yoga or meditation. If you need help reducing stress, ask your health care provider. If you are overweight, reduce your weight to an amount that is healthy for you. Ask your health care provider for guidance about a safe weight loss goal. General instructions Pay attention to any changes in your symptoms. Take over-the-counter and prescription medicines only as told by your health care provider. Do not take aspirin, ibuprofen, or other NSAIDs unless your health care provider told you to take these medicines. Wear loose-fitting clothing. Do not wear anything tight around your waist that causes pressure on your abdomen. Raise (elevate) the head of your bed about 6 inches (15 cm). You can use a wedge to do this. Avoid bending over if this makes your symptoms worse. Keep all follow-up visits. This is important. Contact a health care provider if: You have: New symptoms. Unexplained weight loss. Difficulty swallowing or it hurts to swallow. Wheezing or a persistent cough. A hoarse voice. Your symptoms do not improve with treatment. Get help right away if: You have sudden pain in your arms, neck, jaw, teeth, or back. You suddenly feel sweaty, dizzy, or light-headed. You have chest pain or shortness of breath. You vomit and the vomit is green, yellow, or black, or it looks like blood or coffee grounds. You faint. You have stool that is red, bloody, or black. You cannot swallow, drink, or eat. These symptoms may represent a serious problem that is an emergency. Do not wait to see if the symptoms will go away. Get medical help right away. Call your local emergency services (911 in the  [place] Do not drive yourself to the hospital. Summary Gastroesophageal reflux happens when acid from the stomach flows up into the esophagus. GERD is a disease in which the reflux happens often, causes frequent or severe symptoms, or causes problems such as damage to the esophagus. Treatment for this condition may vary depending on how severe your symptoms are. Your health care provider may recommend diet and lifestyle changes, medicine, or surgery. Contact a health care provider if you have new or worsening symptoms. Take over-the-counter and prescription medicines only as told by your health care provider. Do not take aspirin, ibuprofen, or other NSAIDs unless your health care provider told you to do so. Keep all follow-up visits as told by your health care provider. This is important. This information is not  intended to replace advice given to you by your health care provider. Make sure you discuss any questions you have with your health care provider. Document Revised: 06/05/2020 Document Reviewed: 06/05/2020 Elsevier Patient Education  [place].

## 2022-08-16 ENCOUNTER — Telehealth: Payer: Self-pay

## 2022-08-16 NOTE — Telephone Encounter (Signed)
Please advise. Thank you

## 2022-08-16 NOTE — Telephone Encounter (Signed)
Pt contacted. Pt states she is taking 2 Dexilant daily and has not tried anything else in the past. Pt states the pharmacy just told her the price. [facility]ed [facility]300 and Abilify is $100 for one month supply. Pharmacy states that the patient would have to call insurance to ask them about coverage. Please advise. Thank you

## 2022-08-16 NOTE — Telephone Encounter (Signed)
Caller name:[name]   On DPR? :No  Call back number:[phone]  Provider they see: Hoyle Sauer   Reason for call:Pt is calling to tell Hoyle Sauer the ARIPiprazole (ABILIFY) 2 MG tablet ,dexlansoprazole (DEXILANT) 60 MG capsule pt is saying she is having to pay 100.00 for one and 300.00 for the other medication pt is wanting to know what she needs to do. Also her blood pressure keeps showing up high and pt gets headaches

## 2022-08-20 NOTE — Telephone Encounter (Signed)
Pt contacted. Pt will call insurance to see if she can get a preferred med list and see if they will fax it to [name].

## 2022-08-23 ENCOUNTER — Telehealth: Payer: Self-pay

## 2022-08-23 ENCOUNTER — Other Ambulatory Visit: Payer: Self-pay | Admitting: Nurse Practitioner

## 2022-08-23 MED ORDER — LANSOPRAZOLE 30 MG PO CPDR: DELAYED_RELEASE_CAPSULE | ORAL | 1 refills | Status: DC

## 2022-08-23 NOTE — Telephone Encounter (Signed)
Patient called and gave these two medications - pantoprazole 40 mg and lansoprazole 40 mg per insurance coverage. [facility] uses [facility] [place].

## 2022-08-23 NOTE — Telephone Encounter (Signed)
Sent in [facility]oprazole 30 mg qd to [facility]

## 2022-08-30 ENCOUNTER — Ambulatory Visit (INDEPENDENT_AMBULATORY_CARE_PROVIDER_SITE_OTHER): Payer: Medicare HMO | Admitting: Nurse Practitioner

## 2022-08-30 VITALS — BP 183/96 | HR 68 | Temp 97.3°F | Ht <= 58 in | Wt 143.0 lb

## 2022-08-30 DIAGNOSIS — I10 Essential (primary) hypertension: Secondary | ICD-10-CM | POA: Diagnosis not present

## 2022-08-30 DIAGNOSIS — J069 Acute upper respiratory infection, unspecified: Secondary | ICD-10-CM

## 2022-08-30 DIAGNOSIS — B9689 Other specified bacterial agents as the cause of diseases classified elsewhere: Secondary | ICD-10-CM | POA: Diagnosis not present

## 2022-08-30 MED ORDER — VALSARTAN 80 MG PO TABS: 80.0000 mg | ORAL_TABLET | Freq: Every day | ORAL | 0 refills | Status: DC

## 2022-08-30 MED ORDER — AZITHROMYCIN 250 MG PO TABS: ORAL_TABLET | ORAL | 0 refills | Status: DC

## 2022-08-30 NOTE — Progress Notes (Unsigned)
Subjective:    Patient ID: Patricia [name], female    DOB: 03-28-1946, 76 y.o.   MRN: [id number]  HPI Hoarseness and pain under both ears x 4 days. States her grandson was sick last weekend so she thinks she may have picked it up at that time.  Began having symptoms 4 days ago.  Hoarseness.  Slight sore throat.  Tender anterior lymph nodes.  No sinus headache.  Ear fullness.  No cough.  No fever or chills.  Has tried [employer] with no improvement over the past week.  As part of the visit it was noted that her blood pressure was significantly elevated.  Patient denies any OTC supplements or changes in her diet.  States she has been under stress lately.   Review of Systems  Constitutional:  Positive for fatigue. Negative for chills and fever.  HENT:  Positive for congestion, sore throat and voice change. Negative for sinus pressure, sinus pain and trouble swallowing.        Slight sore throat with hoarseness.  Respiratory:  Negative for cough, chest tightness, shortness of breath and wheezing.   Cardiovascular:  Negative for chest pain, palpitations and leg swelling.       Objective:   Physical Exam NAD.  Alert, oriented.  Calm cheerful affect.  TMs minimal clear effusion, no erythema.  Pharynx injected with green PND noted.  Neck supple with mild soft anterior adenopathy, slightly tender to palpation.  Lungs clear.  Heart regular rate rhythm.  No murmur or gallop noted.  Carotids no bruits or thrills.  BP remains elevated in the same range on recheck.  Lower extremities no edema. Today's Vitals   08/30/22 1127 08/30/22 1212  BP: (!) 172/101 (!) 183/96  Pulse: 68   Temp: (!) 97.3 F (36.3 C)   SpO2: 96%   Weight: 143 lb (64.9 kg)   Height: 4' 9"  (1.448 m)    Body mass index is 30.94 kg/m.  A graph of her blood pressure reveals a progressive increase in readings starting in August 2023. Results for orders placed or performed in visit on 08/02/22  CBC with  Differential/Platelet  Result Value Ref Range   WBC 8.5 3.4 - 10.8 x10E3/uL   RBC 4.70 3.77 - 5.28 x10E6/uL   Hemoglobin 13.3 11.1 - 15.9 g/dL   Hematocrit 41.0 34.0 - 46.6 %   MCV 87 79 - 97 fL   MCH 28.3 26.6 - 33.0 pg   MCHC 32.4 31.5 - 35.7 g/dL   RDW 12.1 11.7 - 15.4 %   Platelets 291 150 - 450 x10E3/uL   Neutrophils 59 Not Estab. %   Lymphs 28 Not Estab. %   Monocytes 7 Not Estab. %   Eos 4 Not Estab. %   Basos 2 Not Estab. %   Neutrophils Absolute 5.0 1.4 - 7.0 x10E3/uL   Lymphocytes Absolute 2.4 0.7 - 3.1 x10E3/uL   Monocytes Absolute 0.6 0.1 - 0.9 x10E3/uL   EOS (ABSOLUTE) 0.3 0.0 - 0.4 x10E3/uL   Basophils Absolute 0.2 0.0 - 0.2 x10E3/uL   Immature Granulocytes 0 Not Estab. %   Immature Grans (Abs) 0.0 0.0 - 0.1 x10E3/uL  Comprehensive metabolic panel  Result Value Ref Range   Glucose 93 70 - 99 mg/dL   BUN 16 8 - 27 mg/dL   Creatinine, Ser 0.86 0.57 - 1.00 mg/dL   eGFR 71 >59 mL/min/1.73   BUN/Creatinine Ratio 19 12 - 28   Sodium 139 134 - 144  mmol/L   Potassium 4.0 3.5 - 5.2 mmol/L   Chloride 100 96 - 106 mmol/L   CO2 24 20 - 29 mmol/L   Calcium 9.9 8.7 - 10.3 mg/dL   Total Protein 7.0 6.0 - 8.5 g/dL   Albumin 4.8 3.8 - 4.8 g/dL   Globulin, Total 2.2 1.5 - 4.5 g/dL   Albumin/Globulin Ratio 2.2 1.2 - 2.2   Bilirubin Total 0.3 0.0 - 1.2 mg/dL   Alkaline Phosphatase 94 44 - 121 IU/L   AST 40 0 - 40 IU/L   ALT 33 (H) 0 - 32 IU/L  Lipid panel  Result Value Ref Range   Cholesterol, Total 198 100 - 199 mg/dL   Triglycerides 75 0 - 149 mg/dL   HDL 83 >39 mg/dL   VLDL Cholesterol Cal 14 5 - 40 mg/dL   LDL Chol Calc (NIH) 101 (H) 0 - 99 mg/dL   Chol/HDL Ratio 2.4 0.0 - 4.4 ratio  TSH  Result Value Ref Range   TSH 2.830 0.450 - 4.500 uIU/mL   Recent labs done on 08/02/2022 shows a normal GFR.     Assessment & Plan:   Problem List Items Addressed This Visit       Cardiovascular and Mediastinum   Primary hypertension   Relevant Medications   valsartan  (DIOVAN) 80 MG tablet   Other Visit Diagnoses     Bacterial URI    -  Primary   Relevant Medications   azithromycin (ZITHROMAX Z-PAK) 250 MG tablet      Meds ordered this encounter  Medications   azithromycin (ZITHROMAX Z-PAK) 250 MG tablet    Sig: Take 2 tablets (500 mg) on  Day 1,  followed by 1 tablet (250 mg) once daily on Days 2 through 5.    Dispense:  6 each    Refill:  0    Order Specific Question:   Supervising Provider    Answer:   Sallee Lange A [[id number]]   valsartan (DIOVAN) 80 MG tablet    Sig: Take 1 tablet (80 mg total) by mouth daily.    Dispense:  30 tablet    Refill:  0    Order Specific Question:   Supervising Provider    Answer:   Sallee Lange A [[id number]]   Continue Allegra and Flonase.  Given samples of Mucinex for congestion.  Start Z-Pak as directed.  Warning signs reviewed.  Call back next week if no improvement, sooner if worse. Discussed diagnosis of hypertension.  Patient has a BP cuff at home.  Start valsartan daily as directed.  Encourage patient to keep check on her blood pressures, record and to send the readings to the office in 2 weeks.  If patient sees no significant improvement in her blood pressure over the next couple of weeks, call the office sooner.  Seek help immediately if any new symptoms.  Patient agrees with this plan. Return in about 2 weeks (around 09/13/2022).

## 2022-08-31 ENCOUNTER — Encounter: Payer: Self-pay | Admitting: Nurse Practitioner

## 2022-09-05 ENCOUNTER — Telehealth: Payer: Self-pay

## 2022-09-05 NOTE — Telephone Encounter (Signed)
Please advise. Thank you

## 2022-09-05 NOTE — Telephone Encounter (Signed)
Caller name:Akilah Glenford Peers   On DPR? :No   Call back number:[phone]  Provider they see: Luking   Reason for call:Pt has been horse for two weeks took all the Z-packs and still not able. She is calling about her blood pressure he said it is 167/80 this morning she said every morning and afternoon its been running. Hoyle Sauer put her on blood pressure med last Friday and still doing this?

## 2022-09-06 ENCOUNTER — Ambulatory Visit (INDEPENDENT_AMBULATORY_CARE_PROVIDER_SITE_OTHER): Payer: Medicare HMO | Admitting: Nurse Practitioner

## 2022-09-06 ENCOUNTER — Encounter: Payer: Self-pay | Admitting: Nurse Practitioner

## 2022-09-06 VITALS — BP 157/100 | HR 71 | Temp 97.9°F | Ht <= 58 in | Wt 143.0 lb

## 2022-09-06 DIAGNOSIS — R490 Dysphonia: Secondary | ICD-10-CM | POA: Diagnosis not present

## 2022-09-06 DIAGNOSIS — I10 Essential (primary) hypertension: Secondary | ICD-10-CM

## 2022-09-06 DIAGNOSIS — B9689 Other specified bacterial agents as the cause of diseases classified elsewhere: Secondary | ICD-10-CM | POA: Diagnosis not present

## 2022-09-06 DIAGNOSIS — J069 Acute upper respiratory infection, unspecified: Secondary | ICD-10-CM | POA: Diagnosis not present

## 2022-09-06 MED ORDER — ROSUVASTATIN CALCIUM 20 MG PO TABS: 20.0000 mg | ORAL_TABLET | Freq: Every day | ORAL | 1 refills | Status: DC

## 2022-09-06 MED ORDER — VALSARTAN-HYDROCHLOROTHIAZIDE 160-25 MG PO TABS: 1.0000 | ORAL_TABLET | Freq: Every day | ORAL | 0 refills | Status: DC

## 2022-09-06 MED ORDER — AMOXICILLIN-POT CLAVULANATE 875-125 MG PO TABS: 1.0000 | ORAL_TABLET | Freq: Two times a day (BID) | ORAL | 0 refills | Status: DC

## 2022-09-06 NOTE — Progress Notes (Unsigned)
   Subjective:    Patient ID: Patricia [name], female    DOB: 24-Dec-1945, 76 y.o.   MRN: [id number]  HPI Follow up for hoarseness; see previous note. No side effects from Valsartan. BP records from 9/23 to today 134-167/76-101.  Completed Zpack. Continues to have drainage and hoarseness seems to be worse. No sore throat or fever. Rare difficulty swallowing. No gagging or choking. No cough. No ear pain. Pressure in the submandibular area anterior cervical. Small amount of clear thick mucus last night.  GERD resolved with use of daily Lansoprazole. No abdominal pain, N/V.  Stopped smoking about 35 years ago.   Review of Systems     Objective:   Physical Exam        Assessment & Plan:   Problem List Items Addressed This Visit       Cardiovascular and Mediastinum   Primary hypertension   Relevant Medications   valsartan-hydrochlorothiazide (DIOVAN-HCT) 160-25 MG tablet   rosuvastatin (CRESTOR) 20 MG tablet   Other Visit Diagnoses     Bacterial URI    -  Primary   Hoarseness       Relevant Orders   Ambulatory referral to ENT      Meds ordered this encounter  Medications   amoxicillin-clavulanate (AUGMENTIN) 875-125 MG tablet    Sig: Take 1 tablet by mouth 2 (two) times daily.    Dispense:  20 tablet    Refill:  0    Order Specific Question:   Supervising Provider    Answer:   Sallee Lange A [[id number]]   valsartan-hydrochlorothiazide (DIOVAN-HCT) 160-25 MG tablet    Sig: Take 1 tablet by mouth daily.    Dispense:  30 tablet    Refill:  0    Order Specific Question:   Supervising Provider    Answer:   Sallee Lange A [[id number]]   Augmentin as directed. Referred to ENT specialist. If symptoms resolve with Augmentin, can cancel the appointment.  Increase Valsartan dose and add HCTZ. Last labs normal GFR and potassium. Monitor BP over next 2 weeks, keep record and bring by the office or post online.  Call back sooner if needed.

## 2022-09-06 NOTE — Telephone Encounter (Signed)
Patient has an appointment today for recheck.

## 2022-09-07 DIAGNOSIS — R490 Dysphonia: Secondary | ICD-10-CM | POA: Insufficient documentation

## 2022-09-13 ENCOUNTER — Telehealth: Payer: Self-pay

## 2022-09-13 ENCOUNTER — Ambulatory Visit: Payer: Medicare HMO | Admitting: Nurse Practitioner

## 2022-09-13 ENCOUNTER — Other Ambulatory Visit: Payer: Self-pay | Admitting: Nurse Practitioner

## 2022-09-13 NOTE — Telephone Encounter (Signed)
Patient was seen in office

## 2022-09-13 NOTE — Progress Notes (Signed)
Patient brought by her daily BP log 9/7-10/6. Results 109-168/62-90 with average 120-130/70-80. Continue current dose of Valsartan/HCTZ and monitor BP. Follow up as planned.  Instructions given to patient by nurse.

## 2022-09-14 ENCOUNTER — Other Ambulatory Visit: Payer: Self-pay | Admitting: Nurse Practitioner

## 2022-09-16 NOTE — Telephone Encounter (Signed)
Error

## 2022-09-29 ENCOUNTER — Other Ambulatory Visit: Payer: Self-pay | Admitting: Nurse Practitioner

## 2022-10-09 ENCOUNTER — Other Ambulatory Visit: Payer: Self-pay | Admitting: Family Medicine

## 2022-11-04 DIAGNOSIS — R490 Dysphonia: Secondary | ICD-10-CM | POA: Diagnosis not present

## 2022-11-04 DIAGNOSIS — J3801 Paralysis of vocal cords and larynx, unilateral: Secondary | ICD-10-CM | POA: Diagnosis not present

## 2022-11-06 ENCOUNTER — Other Ambulatory Visit: Payer: Self-pay | Admitting: Otolaryngology

## 2022-11-06 DIAGNOSIS — J3801 Paralysis of vocal cords and larynx, unilateral: Secondary | ICD-10-CM

## 2022-11-19 ENCOUNTER — Ambulatory Visit
Admission: RE | Admit: 2022-11-19 | Discharge: 2022-11-19 | Disposition: A | Discharge status: Home / Self Care | Payer: Medicare HMO | Source: Ambulatory Visit | Attending: Otolaryngology | Admitting: Otolaryngology

## 2022-11-19 DIAGNOSIS — R590 Localized enlarged lymph nodes: Secondary | ICD-10-CM | POA: Diagnosis not present

## 2022-11-19 DIAGNOSIS — J9809 Other diseases of bronchus, not elsewhere classified: Secondary | ICD-10-CM | POA: Diagnosis not present

## 2022-11-19 DIAGNOSIS — I517 Cardiomegaly: Secondary | ICD-10-CM | POA: Diagnosis not present

## 2022-11-19 DIAGNOSIS — K449 Diaphragmatic hernia without obstruction or gangrene: Secondary | ICD-10-CM | POA: Diagnosis not present

## 2022-11-19 DIAGNOSIS — J984 Other disorders of lung: Secondary | ICD-10-CM | POA: Diagnosis not present

## 2022-11-19 DIAGNOSIS — J3801 Paralysis of vocal cords and larynx, unilateral: Secondary | ICD-10-CM

## 2022-11-19 DIAGNOSIS — M47812 Spondylosis without myelopathy or radiculopathy, cervical region: Secondary | ICD-10-CM | POA: Diagnosis not present

## 2022-11-19 DIAGNOSIS — G589 Mononeuropathy, unspecified: Secondary | ICD-10-CM | POA: Diagnosis not present

## 2022-11-19 DIAGNOSIS — I700 Atherosclerosis of aorta: Secondary | ICD-10-CM | POA: Diagnosis not present

## 2022-11-19 MED ORDER — IOPAMIDOL (ISOVUE-300) INJECTION 61%
75.0000 mL | Freq: Once | INTRAVENOUS | Status: AC | PRN
  Administered 2022-11-19: 75 mL via INTRAVENOUS

## 2022-11-21 ENCOUNTER — Other Ambulatory Visit: Payer: Self-pay

## 2022-11-21 ENCOUNTER — Telehealth: Payer: Self-pay | Admitting: Internal Medicine

## 2022-11-21 DIAGNOSIS — J984 Other disorders of lung: Secondary | ICD-10-CM

## 2022-11-21 NOTE — Telephone Encounter (Signed)
Spoke with patient informed of appointments 12/15

## 2022-11-22 ENCOUNTER — Inpatient Hospital Stay: Payer: Medicare HMO | Admitting: Internal Medicine

## 2022-11-22 ENCOUNTER — Ambulatory Visit: Payer: Medicare HMO | Admitting: Nurse Practitioner

## 2022-11-22 ENCOUNTER — Ambulatory Visit: Payer: Medicare HMO | Admitting: Internal Medicine

## 2022-11-22 ENCOUNTER — Other Ambulatory Visit: Payer: Self-pay

## 2022-11-22 ENCOUNTER — Inpatient Hospital Stay: Payer: Medicare HMO | Attending: Internal Medicine

## 2022-11-22 VITALS — BP 137/76 | HR 72 | Temp 98.0°F | Resp 17 | Ht <= 58 in | Wt 144.1 lb

## 2022-11-22 DIAGNOSIS — Z803 Family history of malignant neoplasm of breast: Secondary | ICD-10-CM

## 2022-11-22 DIAGNOSIS — C3490 Malignant neoplasm of unspecified part of unspecified bronchus or lung: Secondary | ICD-10-CM

## 2022-11-22 DIAGNOSIS — I10 Essential (primary) hypertension: Secondary | ICD-10-CM | POA: Insufficient documentation

## 2022-11-22 DIAGNOSIS — R911 Solitary pulmonary nodule: Secondary | ICD-10-CM | POA: Diagnosis not present

## 2022-11-22 DIAGNOSIS — Z87891 Personal history of nicotine dependence: Secondary | ICD-10-CM | POA: Insufficient documentation

## 2022-11-22 DIAGNOSIS — R590 Localized enlarged lymph nodes: Secondary | ICD-10-CM | POA: Insufficient documentation

## 2022-11-22 DIAGNOSIS — Z90710 Acquired absence of both cervix and uterus: Secondary | ICD-10-CM

## 2022-11-22 DIAGNOSIS — Z8041 Family history of malignant neoplasm of ovary: Secondary | ICD-10-CM | POA: Insufficient documentation

## 2022-11-22 DIAGNOSIS — J984 Other disorders of lung: Secondary | ICD-10-CM

## 2022-11-22 DIAGNOSIS — C3432 Malignant neoplasm of lower lobe, left bronchus or lung: Secondary | ICD-10-CM | POA: Diagnosis not present

## 2022-11-22 LAB — COMPREHENSIVE METABOLIC PANEL
ALT: 33 U/L (ref 0–44)
AST: 29 U/L (ref 15–41)
Albumin: 4.3 g/dL (ref 3.5–5.0)
Alkaline Phosphatase: 108 U/L (ref 38–126)
Anion gap: 6 (ref 5–15)
BUN: 26 mg/dL — ABNORMAL HIGH (ref 8–23)
CO2: 31 mmol/L (ref 22–32)
Calcium: 10.5 mg/dL — ABNORMAL HIGH (ref 8.9–10.3)
Chloride: 102 mmol/L (ref 98–111)
Creatinine, Ser: 0.89 mg/dL (ref 0.44–1.00)
GFR, Estimated: >60 mL/min (ref >60)
Glucose, Bld: 106 mg/dL — ABNORMAL HIGH (ref 70–99)
Potassium: 4.1 mmol/L (ref 3.5–5.1)
Sodium: 139 mmol/L (ref 135–145)
Total Bilirubin: 0.6 mg/dL (ref 0.3–1.2)
Total Protein: 6.8 g/dL (ref 6.5–8.1)

## 2022-11-22 LAB — CBC WITH DIFFERENTIAL/PLATELET
Abs Immature Granulocytes: 0.02 10*3/uL (ref 0.00–0.07)
Basophils Absolute: 0.1 10*3/uL (ref 0.0–0.1)
Basophils Relative: 1 %
Eosinophils Absolute: 0.6 10*3/uL — ABNORMAL HIGH (ref 0.0–0.5)
Eosinophils Relative: 7 %
HCT: 40.4 % (ref 36.0–46.0)
Hemoglobin: 13.5 g/dL (ref 12.0–15.0)
Immature Granulocytes: 0 %
Lymphocytes Relative: 22 %
Lymphs Abs: 1.9 10*3/uL (ref 0.7–4.0)
MCH: 28.6 pg (ref 26.0–34.0)
MCHC: 33.4 g/dL (ref 30.0–36.0)
MCV: 85.6 fL (ref 80.0–100.0)
Monocytes Absolute: 0.6 10*3/uL (ref 0.1–1.0)
Monocytes Relative: 6 %
Neutro Abs: 5.5 10*3/uL (ref 1.7–7.7)
Neutrophils Relative %: 64 %
Platelets: 288 10*3/uL (ref 150–400)
RBC: 4.72 MIL/uL (ref 3.87–5.11)
RDW: 13.2 % (ref 11.5–15.5)
WBC: 8.8 10*3/uL (ref 4.0–10.5)
nRBC: 0 % (ref 0.0–0.2)

## 2022-11-22 NOTE — Progress Notes (Signed)
[facility] [phone]   [phone]  CONSULT NOTE  REFERRING PHYSICIAN: Dr. Ebbie Latus  REASON FOR CONSULTATION:  76 years old white female with likely metastatic neoplasm.  HPI Patricia [name] is a 76 y.o. female with past medical history significant for GERD, depression, dyslipidemia, osteopenia as well as hypertension and history of smoking but quit in 1985.  The patient mentioned that since August 2023 she has been complaining of hoarseness of her voice.  She was seen by her primary care physician and treated with several courses of antibiotics with no improvement.  She was finally referred to Dr. Fredric Dine who did a laryngoscopy and found that she had left vocal cord paralysis.  She ordered CT scan of the neck and chest performed on November 19, 2022 and the scan of the neck showed left lower neck lymph node measuring 2.2 x 2.8 cm.  CT scan of the chest on the same day showed 2.5 cm partially cavitary left lower lobe nodule with extensive low-density adenopathy in the left lower neck, left supraclavicular region, left mediastinum, left hilum and left upper quadrant of the abdomen compatible with malignancy.  There was also scattered subcutaneous nodules in the chest and several left upper quadrant omental, paracolic nodule suspicious for malignancy.  There was also a nonspecific right adrenal mass measuring 1.8 x 1.2 cm. The patient was referred to me today for evaluation and recommendation regarding her condition. When seen today she mentions that she has been developing more subcutaneous nodules and the wall of the chest as well as the back over the last 3 weeks.  She continues to have the hoarseness of her voice as well as low back pain and she takes Aleve.  She denied having any hemoptysis but has intermittent chest pain, shortness of breath and mild cough.  She has no nausea, vomiting, diarrhea or constipation.  She has no headache or visual  changes. Family history significant for mother with COPD.  Father had heart disease and died at age 16.  Son was killed with a gunshot and maternal as well as paternal aunts with breast cancer. The patient is married and has 1 living son.  She works for [employer].  She is doing clerical work.  She was accompanied by her granddaughter [name].  The patient has a history of smoking for around 20 years but quit on September 10, 1984.  She has no history of alcohol or drug abuse.   HPI  Past Medical History:  Diagnosis Date   Depression    GERD (gastroesophageal reflux disease)    Hypercholesterolemia    IFG (impaired fasting glucose)    Osteopenia    PONV (postoperative nausea and vomiting)     Past Surgical History:  Procedure Laterality Date   ABDOMINAL HYSTERECTOMY     CATARACT EXTRACTION W/PHACO  09/12/2011   Procedure: CATARACT EXTRACTION PHACO AND INTRAOCULAR LENS PLACEMENT ([place]);  Surgeon: Tonny Branch;  Location: [facility];  Service: Ophthalmology;  Laterality: Left;  CDE: 8.91   CATARACT EXTRACTION W/PHACO  11/18/2011   Procedure: CATARACT EXTRACTION PHACO AND INTRAOCULAR LENS PLACEMENT ([facility]);  Surgeon: Tonny Branch;  Location: [facility];  Service: Ophthalmology;  Laterality: Right;  CDE:10.26   COLONOSCOPY  2007   Dr. Gala Romney: internal hemorrhoids, single anal papilla poor prep.    COLONOSCOPY N/A 03/27/2017   Rourk: Normal exam.   ECTOPIC PREGNANCY SURGERY     removal of right tube   ESOPHAGOGASTRODUODENOSCOPY (EGD) WITH PROPOFOL N/A  09/10/2021   Procedure: ESOPHAGOGASTRODUODENOSCOPY (EGD) WITH PROPOFOL;  Surgeon: Daneil Dolin, MD;  Location: [facility];  Service: Endoscopy;  Laterality: N/A;  8:15AM   MALONEY DILATION N/A 09/10/2021   Procedure: Venia Minks DILATION;  Surgeon: Daneil Dolin, MD;  Location: [facility];  Service: Endoscopy;  Laterality: N/A;   OVARIAN CYST REMOVAL     right   RECTOCELE REPAIR N/A 01/18/2014   Procedure: POSTERIOR REPAIR (RECTOCELE);  Surgeon: Jonnie Kind, MD;  Location: [facility];  Service: Gynecology;  Laterality: N/A;   VAGINAL HYSTERECTOMY N/A 01/18/2014   Procedure: HYSTERECTOMY VAGINAL;  Surgeon: Jonnie Kind, MD;  Location: [facility];  Service: Gynecology;  Laterality: N/A;    Family History  Problem Relation Age of Onset   Heart attack Father    Osteoporosis Mother    Heart attack Brother 64       MI   Breast cancer Maternal Aunt    Breast cancer Paternal Aunt    Cancer Paternal Aunt        ovarian cancer   Anesthesia problems Neg Hx    Hypotension Neg Hx    Malignant hyperthermia Neg Hx    Pseudochol deficiency Neg Hx    Colon cancer Neg Hx     Social History Social History   Tobacco Use   Smoking status: Former    Packs/day: 1.00    Years: 20.00    Total pack years: 20.00    Types: Cigarettes    Quit date: 09/10/1984    Years since quitting: 38.2   Smokeless tobacco: Never  Substance Use Topics   Alcohol use: No   Drug use: No    Allergies  Allergen Reactions   Codeine Nausea Only    Current Outpatient Medications  Medication Sig Dispense Refill   ALPRAZolam (XANAX) 0.5 MG tablet TAKE (1) TABLET BY MOUTH AT BEDTIME AS NEEDED FOR SLEEP. 30 tablet 2   b complex vitamins capsule Take 1 capsule by mouth daily.     Calcium Carb-Cholecalciferol (CALCIUM 600 + D PO) Take 1 tablet by mouth at bedtime.      Cholecalciferol (VITAMIN D3) 125 MCG (5000 UT) CAPS Take 5,000 Units by mouth daily.     COLLAGEN PO Take 2 each by mouth daily. Chewables     lansoprazole (PREVACID) 30 MG capsule Take one po qd for acid reflux 90 capsule 1   meclizine (ANTIVERT) 25 MG tablet Take one tablet po TID prn dizziness 30 tablet 0   Multiple Vitamin (MULTIVITAMIN WITH MINERALS) TABS tablet Take 1 tablet by mouth daily.     naproxen sodium (ALEVE) 220 MG tablet Take 220 mg by mouth daily as needed (pain).     PARoxetine (PAXIL) 40 MG tablet TAKE 1 TABLET BY MOUTH EVERY DAY IN THE MORNING 90 tablet 2   rosuvastatin (CRESTOR) 20  MG tablet Take 1 tablet (20 mg total) by mouth daily. 100 tablet 1   valsartan-hydrochlorothiazide (DIOVAN-HCT) 160-25 MG tablet TAKE 1 TABLET BY MOUTH EVERY DAY 90 tablet 1   No current facility-administered medications for this visit.    Review of Systems  Constitutional: positive for fatigue and weight loss Eyes: negative Ears, nose, mouth, throat, and face: positive for hoarseness and voice change Respiratory: positive for cough Cardiovascular: negative Gastrointestinal: negative Genitourinary:negative Integument/breast: negative Hematologic/lymphatic: negative Musculoskeletal:positive for back pain Neurological: negative Behavioral/Psych: negative Endocrine: negative Allergic/Immunologic: negative  Physical Exam  ELF:YBOFB, healthy, no distress, well nourished, and well developed SKIN: skin  color, texture, turgor are normal, no rashes or significant lesions HEAD: Normocephalic, No masses, lesions, tenderness or abnormalities EYES: normal, PERRLA, Conjunctiva are pink and non-injected EARS: External ears normal, Canals clear OROPHARYNX:no exudate, no erythema, and lips, buccal mucosa, and tongue normal  NECK: supple, no adenopathy, no JVD LYMPH:  no palpable lymphadenopathy, no hepatosplenomegaly BREAST:not examined LUNGS: clear to auscultation , and palpation HEART: regular rate & rhythm, no murmurs, and no gallops ABDOMEN:abdomen soft, non-tender, normal bowel sounds, no masses or organomegaly, and palpable subcutaneous nodules in the abdominal wall as well as back BACK: Back symmetric, no curvature., No CVA tenderness EXTREMITIES:no joint deformities, effusion, or inflammation, no edema  NEURO: alert & oriented x 3 with fluent speech, no focal motor/sensory deficits  PERFORMANCE STATUS: ECOG 1  LABORATORY DATA: Lab Results  Component Value Date   WBC 8.8 11/22/2022   HGB 13.5 11/22/2022   HCT 40.4 11/22/2022   MCV 85.6 11/22/2022   PLT 288 11/22/2022       Chemistry      Component Value Date/Time   NA 139 08/02/2022 1112   K 4.0 08/02/2022 1112   CL 100 08/02/2022 1112   CO2 24 08/02/2022 1112   BUN 16 08/02/2022 1112   CREATININE 0.86 08/02/2022 1112      Component Value Date/Time   CALCIUM 9.9 08/02/2022 1112   ALKPHOS 94 08/02/2022 1112   AST 40 08/02/2022 1112   ALT 33 (H) 08/02/2022 1112   BILITOT 0.3 08/02/2022 1112       RADIOGRAPHIC STUDIES: CT CHEST W CONTRAST  Result Date: 11/20/2022 CLINICAL DATA:  Left vocal cord paralysis. Reported "knots around sides and chest" for 2 months. EXAM: CT CHEST WITH CONTRAST TECHNIQUE: Multidetector CT imaging of the chest was performed during intravenous contrast administration. RADIATION DOSE REDUCTION: This exam was performed according to the departmental dose-optimization program which includes automated exposure control, adjustment of the mA and/or kV according to patient size and/or use of iterative reconstruction technique. CONTRAST:  64mL ISOVUE-300 IOPAMIDOL (ISOVUE-300) INJECTION 61% COMPARISON:  Chest radiograph 03/26/2005 FINDINGS: Cardiovascular: Low-density tumor/adenopathy surrounds the brachiocephalic artery along the top of the aortic arch. This also surrounds the left common carotid artery. No substantial degree of extrinsic narrowing although there is atheromatous calcification in the branch vasculature and thoracic aorta. Left anterior descending and right coronary artery atherosclerotic calcification is observed. Mediastinum/Nodes: Left supraclavicular node extending into level IV of the lower neck 1.8 cm in short axis diameter on image 5 series 4. Conglomerate low-density adenopathy surrounding the branch vessels, 3.4 cm in short axis on image 24 series 4. AP window node 2.6 cm in short axis, image 38 series 4. Adjacent adenopathy to the left of the left mainstem bronchus 2.4 cm in short axis on image 44 series 4, with some extrinsic narrowing of the left mainstem bronchus. This  adenopathy could certainly be affecting the recurrent laryngeal nerve on the left. Moderate-sized hiatal hernia. Lungs/Pleura: Partially cavitary 2.5 by 1.9 by 1.8 cm left lower lobe nodule, image 69 series 8. Mild scarring in both lower lobes. Upper Abdomen: Left upper quadrant nodule adjacent to the splenic flexure, lower density than the spleen, 1.9 by 1.4 cm, likely malignancy. Similar 0.6 cm nodule on image 88 series 4 in the left upper quadrant adipose tissue. Another 4 mm left omental nodule is present on image 126 series 4. Another nodule just below the right hepatic lobe measures 0.6 cm in diameter on image 130 series 4. Nonspecific right adrenal mass  1.8 by 1.2 cm, image 109 series 4 Musculoskeletal: There are few scattered subcutaneous nodules concerning for possible malignancy. Index nodule in the left upper breast 1.5 by 1.2 cm, image 11 series 4. Index nodule in the left lower anterior chest 1.3 by 1.0 cm on image 72 series 4. Mild lower thoracic kyphosis. No definite bony metastatic lesion observed. IMPRESSION: 1. 2.5 cm partially cavitary left lower lobe nodule with extensive low-density adenopathy in the left lower neck, left supraclavicular region, left mediastinum, left hilum, and left upper quadrant of the abdomen, compatible with malignancy. 2. Scattered subcutaneous nodules in the chest, and several left upper quadrant omental/paracolic nodules, suspicious for malignancy. 3. Nonspecific right adrenal mass 1.8 by 1.2 cm. 4. Moderate-sized hiatal hernia. 5. Coronary and aortic atherosclerosis. Aortic Atherosclerosis (ICD10-I70.0). Electronically Signed   By: Van Clines M.D.   On: 11/20/2022 10:31   CT SOFT TISSUE NECK W CONTRAST  Result Date: 11/20/2022 CLINICAL DATA:  Left focal cord paralysis since August. Smoking history. EXAM: CT NECK WITH CONTRAST TECHNIQUE: Multidetector CT imaging of the neck was performed using the standard protocol following the bolus administration of  intravenous contrast. RADIATION DOSE REDUCTION: This exam was performed according to the departmental dose-optimization program which includes automated exposure control, adjustment of the mA and/or kV according to patient size and/or use of iterative reconstruction technique. CONTRAST:  72mL ISOVUE-300 IOPAMIDOL (ISOVUE-300) INJECTION 61% COMPARISON:  None Available. FINDINGS: Pharynx and larynx: No sign of mucosal or submucosal mass lesion. Patient does have evidence of recurrent laryngeal nerve dysfunction on the left with mild asymmetric prominence of the piriform sinus, vocal fold near the midline, and mild dilatation of the left laryngeal ventricle. Salivary glands: Parotid and submandibular glands are normal. Thyroid: Normal Lymph nodes: No lymphadenopathy on the right. On the left, there is lower neck lymphadenopathy beginning at the level 4 nodal station. Low-density node image 59 shows a diameter of 8 x 10 mm. Low-density node at level 6 axial image 65 measures 2.2 x 2.8 cm in size. Vascular: No abnormal vascular finding in the neck. Limited intracranial: Normal Visualized orbits: Normal Mastoids and visualized paranasal sinuses: Clear Skeleton: Mid cervical spondylosis. No evidence of lytic or destructive bone lesion. Upper chest: See results of chest CT. Extensive lymphadenopathy undoubtedly responsible for the left recurrent laryngeal nerve compromise. Other: None IMPRESSION: 1. Lymphadenopathy in the left lower neck beginning at the level 4 nodal station and extending to the level 6 nodal station. The largest node measures 2.2 x 2.8 cm in size at level 6. This is consistent with metastatic involvement from lung cancer. See results of chest CT. 2. Patient does have evidence of recurrent laryngeal nerve dysfunction on the left with mild asymmetric prominence of the piriform sinus, vocal fold near the midline, and mild dilatation of the left laryngeal ventricle. Electronically Signed   By: Nelson Chimes  M.D.   On: 11/20/2022 07:59    ASSESSMENT: This is a very pleasant 76 years old white female with likely metastatic neoplasm presented with left lower lobe lung nodule in addition to extensive lymphadenopathy involving the neck, left hilar and mediastinal lymphadenopathy as well as upper abdomen lymph nodes and multiple subcutaneous nodules highly suspicious for lung cancer but other neoplasm like melanoma could not be completely excluded at this point.   PLAN: I had a lengthy discussion with the patient and her granddaughter today about her current condition and further investigation to confirm her diagnosis. I personally and independently reviewed the scan images  and discussed results with the patient and her granddaughter. I recommended for the patient to complete the staging workup by ordering a PET scan as well as MRI of the brain. I will also refer the patient to interventional radiology for consideration of ultrasound-guided core biopsy of the large left supraclavicular lymph node or one of the subcutaneous pulmonary nodules. I will arrange for the patient to come back for follow-up visit in around 3 weeks for more detailed discussion of her treatment options based on the final restaging workup and biopsy. The patient was advised to call immediately if she has any other concerning symptoms in the interval. The patient voices understanding of current disease status and treatment options and is in agreement with the current care plan.  All questions were answered. The patient knows to call the clinic with any problems, questions or concerns. We can certainly see the patient much sooner if necessary.  Thank you so much for allowing me to participate in the care of [place]. I will continue to follow up the patient with you and assist in her care.  The total time spent in the appointment was 60 minutes.  Disclaimer: This note was dictated with voice recognition software. Similar  sounding words can inadvertently be transcribed and may not be corrected upon review.   Eilleen Kempf November 22, 2022, 10:15 AM

## 2022-11-26 ENCOUNTER — Encounter: Payer: Self-pay | Admitting: General Practice

## 2022-11-26 NOTE — Progress Notes (Signed)
Patricia Cleveland, MD  Allen Kell, NT Ok [name] core bx  L ant chest nodule (11/19/22 CT chest  Im 72 Se 4) OR L supraclav LAN Im 1  DDH       Previous Messages    ----- Message ----- From: Allen Kell, NT Sent: 11/26/2022   8:52 AM EST To: [facility] Procedure Requests Subject: FW: [name] CORE BIOPSY (LYMPH NODES)              Sent Friday just wanted to send again thanks ----- Message ----- From: Allen Kell, NT Sent: 11/22/2022   1:10 PM EST To: Ir Procedure Requests Subject: [name] CORE BIOPSY (LYMPH NODES)                  Procedure: [name] CORE BIOPSY (LYMPH NODES)  Reason: Ultrasound-guided core biopsy of left supraclavicular lymph node or any of the accessible subcutaneous nodules. Dx: Malignant neoplasm of unspecified part of unspecified bronchus or lung ([place]) [C34.90 (ICD-10-CM)]   History: CT in chart  Provider: Curt Bears, MD  Contact: [phone]

## 2022-11-29 ENCOUNTER — Ambulatory Visit (HOSPITAL_COMMUNITY)
Admission: RE | Admit: 2022-11-29 | Discharge: 2022-11-29 | Disposition: A | Discharge status: Home / Self Care | Payer: Medicare HMO | Source: Ambulatory Visit | Attending: Internal Medicine | Admitting: Internal Medicine

## 2022-11-29 DIAGNOSIS — C3490 Malignant neoplasm of unspecified part of unspecified bronchus or lung: Secondary | ICD-10-CM | POA: Insufficient documentation

## 2022-11-29 DIAGNOSIS — G319 Degenerative disease of nervous system, unspecified: Secondary | ICD-10-CM | POA: Diagnosis not present

## 2022-11-29 MED ORDER — GADOBUTROL 1 MMOL/ML IV SOLN
6.5000 mL | Freq: Once | INTRAVENOUS | Status: AC | PRN
  Administered 2022-11-29: 6.5 mL via INTRAVENOUS

## 2022-12-04 ENCOUNTER — Telehealth: Payer: Self-pay | Admitting: Internal Medicine

## 2022-12-04 NOTE — Telephone Encounter (Signed)
Called patient and spoke with patient's granddaughter regarding upcoming appointments, scheduled the follow-up after scans.Patient will be notified.

## 2022-12-06 ENCOUNTER — Ambulatory Visit (HOSPITAL_COMMUNITY)
Admission: RE | Admit: 2022-12-06 | Discharge: 2022-12-06 | Disposition: A | Discharge status: Home / Self Care | Payer: Medicare HMO | Source: Ambulatory Visit | Attending: Internal Medicine | Admitting: Internal Medicine

## 2022-12-06 DIAGNOSIS — C786 Secondary malignant neoplasm of retroperitoneum and peritoneum: Secondary | ICD-10-CM | POA: Diagnosis not present

## 2022-12-06 DIAGNOSIS — C3490 Malignant neoplasm of unspecified part of unspecified bronchus or lung: Secondary | ICD-10-CM | POA: Insufficient documentation

## 2022-12-06 DIAGNOSIS — C7491 Malignant neoplasm of unspecified part of right adrenal gland: Secondary | ICD-10-CM | POA: Diagnosis not present

## 2022-12-06 DIAGNOSIS — C763 Malignant neoplasm of pelvis: Secondary | ICD-10-CM | POA: Diagnosis not present

## 2022-12-06 LAB — GLUCOSE, CAPILLARY: Glucose-Capillary: 93 mg/dL (ref 70–99)

## 2022-12-06 MED ORDER — FLUDEOXYGLUCOSE F - 18 (FDG) INJECTION
7.1800 | Freq: Once | INTRAVENOUS | Status: AC
  Administered 2022-12-06: 7.18 via INTRAVENOUS

## 2022-12-11 ENCOUNTER — Ambulatory Visit: Payer: Medicare HMO | Admitting: Internal Medicine

## 2022-12-13 ENCOUNTER — Other Ambulatory Visit: Payer: Self-pay | Admitting: Radiology

## 2022-12-13 DIAGNOSIS — C3490 Malignant neoplasm of unspecified part of unspecified bronchus or lung: Secondary | ICD-10-CM

## 2022-12-16 ENCOUNTER — Ambulatory Visit (HOSPITAL_COMMUNITY)
Admission: RE | Admit: 2022-12-16 | Discharge: 2022-12-16 | Disposition: A | Discharge status: Home / Self Care | Payer: Medicare HMO | Source: Ambulatory Visit | Attending: Internal Medicine | Admitting: Internal Medicine

## 2022-12-16 ENCOUNTER — Encounter (HOSPITAL_COMMUNITY): Payer: Self-pay

## 2022-12-16 DIAGNOSIS — C3492 Malignant neoplasm of unspecified part of left bronchus or lung: Secondary | ICD-10-CM | POA: Diagnosis not present

## 2022-12-16 DIAGNOSIS — C3490 Malignant neoplasm of unspecified part of unspecified bronchus or lung: Secondary | ICD-10-CM | POA: Diagnosis not present

## 2022-12-16 DIAGNOSIS — R0602 Shortness of breath: Secondary | ICD-10-CM | POA: Diagnosis not present

## 2022-12-16 DIAGNOSIS — Z87891 Personal history of nicotine dependence: Secondary | ICD-10-CM | POA: Diagnosis not present

## 2022-12-16 DIAGNOSIS — E7800 Pure hypercholesterolemia, unspecified: Secondary | ICD-10-CM | POA: Insufficient documentation

## 2022-12-16 DIAGNOSIS — I10 Essential (primary) hypertension: Secondary | ICD-10-CM | POA: Insufficient documentation

## 2022-12-16 DIAGNOSIS — K219 Gastro-esophageal reflux disease without esophagitis: Secondary | ICD-10-CM | POA: Diagnosis not present

## 2022-12-16 DIAGNOSIS — C770 Secondary and unspecified malignant neoplasm of lymph nodes of head, face and neck: Secondary | ICD-10-CM | POA: Insufficient documentation

## 2022-12-16 DIAGNOSIS — C801 Malignant (primary) neoplasm, unspecified: Secondary | ICD-10-CM | POA: Diagnosis not present

## 2022-12-16 DIAGNOSIS — R590 Localized enlarged lymph nodes: Secondary | ICD-10-CM | POA: Diagnosis not present

## 2022-12-16 DIAGNOSIS — Z85118 Personal history of other malignant neoplasm of bronchus and lung: Secondary | ICD-10-CM | POA: Diagnosis not present

## 2022-12-16 DIAGNOSIS — R911 Solitary pulmonary nodule: Secondary | ICD-10-CM | POA: Diagnosis not present

## 2022-12-16 LAB — CBC WITH DIFFERENTIAL/PLATELET
Abs Immature Granulocytes: 0.04 10*3/uL (ref 0.00–0.07)
Basophils Absolute: 0.1 10*3/uL (ref 0.0–0.1)
Basophils Relative: 1 %
Eosinophils Absolute: 0.8 10*3/uL — ABNORMAL HIGH (ref 0.0–0.5)
Eosinophils Relative: 6 %
HCT: 41.8 % (ref 36.0–46.0)
Hemoglobin: 13.4 g/dL (ref 12.0–15.0)
Immature Granulocytes: 0 %
Lymphocytes Relative: 16 %
Lymphs Abs: 2 10*3/uL (ref 0.7–4.0)
MCH: 28.1 pg (ref 26.0–34.0)
MCHC: 32.1 g/dL (ref 30.0–36.0)
MCV: 87.6 fL (ref 80.0–100.0)
Monocytes Absolute: 0.7 10*3/uL (ref 0.1–1.0)
Monocytes Relative: 6 %
Neutro Abs: 8.8 10*3/uL — ABNORMAL HIGH (ref 1.7–7.7)
Neutrophils Relative %: 71 %
Platelets: 292 10*3/uL (ref 150–400)
RBC: 4.77 MIL/uL (ref 3.87–5.11)
RDW: 12.8 % (ref 11.5–15.5)
WBC: 12.4 10*3/uL — ABNORMAL HIGH (ref 4.0–10.5)
nRBC: 0 % (ref 0.0–0.2)

## 2022-12-16 LAB — PROTIME-INR
INR: 1.1 (ref 0.8–1.2)
Prothrombin Time: 14.4 seconds (ref 11.4–15.2)

## 2022-12-16 LAB — BASIC METABOLIC PANEL
Anion gap: 11 (ref 5–15)
BUN: 16 mg/dL (ref 8–23)
CO2: 25 mmol/L (ref 22–32)
Calcium: 9.7 mg/dL (ref 8.9–10.3)
Chloride: 103 mmol/L (ref 98–111)
Creatinine, Ser: 0.74 mg/dL (ref 0.44–1.00)
GFR, Estimated: >60 mL/min (ref >60)
Glucose, Bld: 102 mg/dL — ABNORMAL HIGH (ref 70–99)
Potassium: 3.6 mmol/L (ref 3.5–5.1)
Sodium: 139 mmol/L (ref 135–145)

## 2022-12-16 MED ORDER — LIDOCAINE HCL 1 % IJ SOLN
INTRAMUSCULAR | Status: AC
  Administered 2022-12-16: 10 mL
  Filled 2022-12-16: qty 20

## 2022-12-16 MED ORDER — MIDAZOLAM HCL 2 MG/2ML IJ SOLN
INTRAMUSCULAR | Status: AC | PRN
  Administered 2022-12-16: .5 mg via INTRAVENOUS
  Administered 2022-12-16: 1 mg via INTRAVENOUS

## 2022-12-16 MED ORDER — FENTANYL CITRATE (PF) 100 MCG/2ML IJ SOLN
INTRAMUSCULAR | Status: AC | PRN
  Administered 2022-12-16: 25 ug via INTRAVENOUS
  Administered 2022-12-16: 50 ug via INTRAVENOUS

## 2022-12-16 MED ORDER — FENTANYL CITRATE (PF) 100 MCG/2ML IJ SOLN
INTRAMUSCULAR | Status: AC
  Filled 2022-12-16: qty 2

## 2022-12-16 MED ORDER — SODIUM CHLORIDE 0.9 % IV SOLN: INTRAVENOUS | Status: DC

## 2022-12-16 MED ORDER — MIDAZOLAM HCL 2 MG/2ML IJ SOLN
INTRAMUSCULAR | Status: AC
  Filled 2022-12-16: qty 2

## 2022-12-16 NOTE — Consult Note (Signed)
Chief Complaint: Patient was seen in consultation today for  image guided biopsy of left anterior chest nodule or left supraclavicular lymph node  Referring Physician(s): Mohamed,Mohamed  Supervising Physician: Jacqulynn Cadet  Patient Status: [facility] - Out-pt  History of Present Illness: Patricia [name] is a 77 y.o. female, remote smoker, with past medical history of depression, GERD, hypercholesterolemia, hypertension, left vocal cord paralysis who presents now with likely metastatic neoplasm and imaging showing left lower lobe lung nodule in addition to extensive lymphadenopathy involving the neck, left hilar-mediastinal region along with multiple subcutaneous nodules.  Patient has no past history of malignancy.  She presents today for image guided biopsy of either left anterior chest nodule/other subcutaneous nodule versus left supraclavicular lymph node.  Past Medical History:  Diagnosis Date   Depression    GERD (gastroesophageal reflux disease)    Hypercholesterolemia    IFG (impaired fasting glucose)    Osteopenia    PONV (postoperative nausea and vomiting)     Past Surgical History:  Procedure Laterality Date   ABDOMINAL HYSTERECTOMY     CATARACT EXTRACTION W/PHACO  09/12/2011   Procedure: CATARACT EXTRACTION PHACO AND INTRAOCULAR LENS PLACEMENT ([place]);  Surgeon: Tonny Branch;  Location: [facility] ORS;  Service: Ophthalmology;  Laterality: Left;  CDE: 8.91   CATARACT EXTRACTION W/PHACO  11/18/2011   Procedure: CATARACT EXTRACTION PHACO AND INTRAOCULAR LENS PLACEMENT ([facility]);  Surgeon: Tonny Branch;  Location: [facility] ORS;  Service: Ophthalmology;  Laterality: Right;  CDE:10.26   COLONOSCOPY  2007   Dr. Gala Romney: internal hemorrhoids, single anal papilla poor prep.    COLONOSCOPY N/A 03/27/2017   Rourk: Normal exam.   ECTOPIC PREGNANCY SURGERY     removal of right tube   ESOPHAGOGASTRODUODENOSCOPY (EGD) WITH PROPOFOL N/A 09/10/2021   Procedure: ESOPHAGOGASTRODUODENOSCOPY (EGD) WITH  PROPOFOL;  Surgeon: Daneil Dolin, MD;  Location: [facility] END[facility] SUITE;  Service: Endoscopy;  Laterality: N/A;  8:15AM   MALONEY DILATION N/A 09/10/2021   Procedure: Venia Minks DILATION;  Surgeon: Daneil Dolin, MD;  Location: [facility] SU[facility];  Service: Endoscopy;  Laterality: N/A;   OVARIAN CYST REMOVAL     right   RECTOCELE REPAIR N/A 01/18/2014   Procedure: POSTERIOR REPAIR (RECTOCELE);  Surgeon: Jonnie Kind, MD;  Location: [facility];  Service: Gynecology;  Laterality: N/A;   VAGINAL HYSTERECTOMY N/A 01/18/2014   Procedure: HYSTERECTOMY VAGINAL;  Surgeon: Jonnie Kind, MD;  Location: [facility];  Service: Gynecology;  Laterality: N/A;    Allergies: Codeine  Medications: Prior to Admission medications   Medication Sig Start Date End Date Taking? Authorizing Provider  ALPRAZolam Duanne Moron) 0.5 MG tablet TAKE (1) TABLET BY MOUTH AT BEDTIME AS NEEDED FOR SLEEP. 03/01/22  Yes Nilda Simmer, NP  b complex vitamins capsule Take 1 capsule by mouth daily.   Yes [provider]  Calcium Carb-Cholecalciferol (CALCIUM 600 + D PO) Take 1 tablet by mouth at bedtime.    Yes [provider]  Cholecalciferol (VITAMIN D3) 125 MCG (5000 UT) CAPS Take 5,000 Units by mouth daily.   Yes [provider]  COLLAGEN PO Take 2 each by mouth daily. Chewables   Yes [provider]  lansoprazole (PREVACID) 30 MG capsule Take one po qd for acid reflux 08/23/22  Yes Nilda Simmer, NP  Multiple Vitamin (MULTIVITAMIN WITH MINERALS) TABS tablet Take 1 tablet by mouth daily.   Yes [provider]  naproxen sodium (ALEVE) 220 MG tablet Take 220 mg by mouth daily as needed (pain).  Yes [provider]  PARoxetine (PAXIL) 40 MG tablet TAKE 1 TABLET BY MOUTH EVERY DAY IN THE MORNING 10/11/22  Yes Nilda Simmer, NP  rosuvastatin (CRESTOR) 20 MG tablet Take 1 tablet (20 mg total) by mouth daily. 09/06/22  Yes Nilda Simmer, NP  valsartan-hydrochlorothiazide (DIOVAN-HCT)  160-25 MG tablet TAKE 1 TABLET BY MOUTH EVERY DAY 09/30/22  Yes [name], MD  meclizine (ANTIVERT) 25 MG tablet Take one tablet po TID prn dizziness 03/01/22   Nilda Simmer, NP     Family History  Problem Relation Age of Onset   Heart attack Father    Osteoporosis Mother    Heart attack Brother 90       MI   Breast cancer Maternal Aunt    Breast cancer Paternal Aunt    Cancer Paternal Aunt        ovarian cancer   Anesthesia problems Neg Hx    Hypotension Neg Hx    Malignant hyperthermia Neg Hx    Pseudochol deficiency Neg Hx    Colon cancer Neg Hx     Social History   Socioeconomic History   Marital status: Married    Spouse name: Not on file   Number of children: Not on file   Years of education: Not on file   Highest education level: Not on file  Occupational History   Not on file  Tobacco Use   Smoking status: Former    Packs/day: 1.00    Years: 20.00    Total pack years: 20.00    Types: Cigarettes    Quit date: 09/10/1984    Years since quitting: 38.2   Smokeless tobacco: Never  Vaping Use   Vaping Use: Never used  Substance and Sexual Activity   Alcohol use: No   Drug use: No   Sexual activity: Not Currently    Birth control/protection: Post-menopausal  Other Topics Concern   Not on file  Social History Narrative   Not on file   Social Determinants of Health   Financial Resource Strain: Not on file  Food Insecurity: Not on file  Transportation Needs: Not on file  Physical Activity: Not on file  Stress: Not on file  Social Connections: Not on file      Review of Systems denies fever, chest pain, dyspnea, abdominal/back pain, nausea, vomiting or bleeding.  She does have occasional headache, hoarseness, occasional cough, fatigue, weight loss  Vital Signs: BP 132/88 (BP Location: Right Arm)   Pulse 93   Temp 97.6 F (36.4 C) (Oral)   Resp 16   SpO2 93%      Physical Exam awake, alert.  Chest clear to auscultation bilaterally;  heart with regular rate and rhythm.  Abdomen soft, positive bowel sounds, nontender.  No lower extremity edema; left supraclavicular adenopathy; subcutaneous nodules of chest abdomen, pelvis, extremity regions on imaging  Imaging: NM PET Image Initial (PI) Skull Base To Thigh (F-18 FDG)  Result Date: 12/11/2022 CLINICAL DATA:  Initial treatment strategy for non-small cell lung cancer. EXAM: NUCLEAR MEDICINE PET SKULL BASE TO THIGH TECHNIQUE: 7.1 mCi F-18 FDG was injected intravenously. Full-ring PET imaging was performed from the skull base to thigh after the radiotracer. CT data was obtained and used for attenuation correction and anatomic localization. Fasting blood glucose: 93 mg/dl COMPARISON:  None Available. FINDINGS: Mediastinal blood pool activity: SUV max 2.96 Liver activity: SUV max NA NECK: Hypometabolism of the LEFT focal cord consistent with paralysis. Large hypermetabolic LEFT supraclavicular  node with SUV max equal 20.1 and measuring 16 mm short axis image 30. Incidental CT findings: None. CHEST: Large hypermetabolic AP window mass measuring 2.7 cm with SUV max equal 23.9 (image 52). LEFT lobe nodule measuring 2.3 cm with SUV max 10.1. Within the subcutaneous tissue of the thorax there are multiple hypermetabolic soft tissue nodules. Example in the nodule deep to the LEFT breast tissue measuring 10 mm (image 50) with SUV max equal 7.8. Nodule superficial to the RIGHT scapula measuring 8 mm with SUV max equal 26.5. Nodule in the upper RIGHT arm measuring 16 mm SUV max equal 13.3. Approximately 12 nodules Incidental CT findings: None. ABDOMEN/PELVIS: Enlarged hypermetabolic RIGHT adrenal gland with SUV max equal 9.6. Nodule in the peritoneal ventral peritoneal surface along the linea alba measures 7 mm SUV max equal 21.8 on image 134. Retroperitoneal nodule adjacent to the LEFT kidney SUV max equal 8.4. Several peritoneal nodules in LEFT upper quadrant. For example 17 mm nodule with SUV max equal  4.1. As in the thorax, multiple cutaneous nodules are intensely hypermetabolic. Several hypermetabolic muscular nodules. For example in the RIGHT gluteus maximus muscle metabolic nodule measuring 9.7 SUV max on 144 Incidental CT findings: None. SKELETON: No focal hypermetabolic activity to suggest skeletal metastasis. Incidental CT findings: None. IMPRESSION: 1. Hypermetabolic LEFT lobe pulmonary nodule consistent with primary bronchogenic carcinoma. 2. Hypermetabolic mediastinal and LEFT supraclavicular adenopathy. The enlarged AP window nodal metastasis impinges upon the LEFT recurrent laryngeal nerve (LEFT focal cord paralysis). 3. Multiple hypermetabolic cutaneous nodules within the chest, abdomen, pelvis and extremities. 4. RIGHT adrenal gland hypermetabolic metastasis. 5. Peritoneal and  retroperitoneal nodular  metastasis. 6. Muscular metastasis primarily in the pelvis. Electronically Signed   By: Suzy Bouchard M.D.   On: 12/11/2022 09:40   MR BRAIN W WO CONTRAST  Result Date: 11/29/2022 CLINICAL DATA:  Provided history: Malignant neoplasm of unspecified part of unspecified bronchus or lung. Non-small cell lung cancer, staging. EXAM: MRI HEAD WITHOUT AND WITH CONTRAST TECHNIQUE: Multiplanar, multiecho pulse sequences of the brain and surrounding structures were obtained without and with intravenous contrast. CONTRAST:  6.7mL GADAVIST GADOBUTROL 1 MMOL/ML IV SOLN COMPARISON:  None. FINDINGS: Brain: Mild cerebral atrophy. Multifocal T2 FLAIR hyperintense signal abnormality within the cerebral white matter, nonspecific but compatible with mild-to-moderate chronic small vessel ischemic disease. Moderate chronic small vessel ischemic changes within the pons. Chronic small vessel ischemic changes are also present within the right basal ganglia. T2 hyperintense foci within the left thalamus, favored to reflect prominent perivascular spaces. Cerebellar tonsillar ectopia on the left with the left cerebellar  tonsil extending 4 mm below the level of the foramen magnum. This does not meet measurement criteria for a Chiari I malformation. There is no significant crowding at the level of the foramen magnum. There is no acute infarct. No evidence of an intracranial mass. No chronic intracranial blood products. No extra-axial fluid collection. No midline shift. No pathologic intracranial enhancement identified. Vascular: Maintained flow voids within the proximal large arterial vessels. Skull and upper cervical spine: No focal suspicious marrow lesion. Incompletely assessed cervical spondylosis. Sinuses/Orbits: No mass or acute finding within the imaged orbits. Prior bilateral ocular lens replacement. No significant paranasal sinus disease. IMPRESSION: 1. No evidence of intracranial metastatic disease. 2. Sequelae of chronic small vessel ischemia, as described. 3. Mild cerebral atrophy. Electronically Signed   By: Kellie Simmering D.O.   On: 11/29/2022 19:06   CT CHEST W CONTRAST  Result Date: 11/20/2022 CLINICAL DATA:  Left vocal cord  paralysis. Reported "knots around sides and chest" for 2 months. EXAM: CT CHEST WITH CONTRAST TECHNIQUE: Multidetector CT imaging of the chest was performed during intravenous contrast administration. RADIATION DOSE REDUCTION: This exam was performed according to the departmental dose-optimization program which includes automated exposure control, adjustment of the mA and/or kV according to patient size and/or use of iterative reconstruction technique. CONTRAST:  19mL ISOVUE-300 IOPAMIDOL (ISOVUE-300) INJECTION 61% COMPARISON:  Chest radiograph 03/26/2005 FINDINGS: Cardiovascular: Low-density tumor/adenopathy surrounds the brachiocephalic artery along the top of the aortic arch. This also surrounds the left common carotid artery. No substantial degree of extrinsic narrowing although there is atheromatous calcification in the branch vasculature and thoracic aorta. Left anterior descending and  right coronary artery atherosclerotic calcification is observed. Mediastinum/Nodes: Left supraclavicular node extending into level IV of the lower neck 1.8 cm in short axis diameter on image 5 series 4. Conglomerate low-density adenopathy surrounding the branch vessels, 3.4 cm in short axis on image 24 series 4. AP window node 2.6 cm in short axis, image 38 series 4. Adjacent adenopathy to the left of the left mainstem bronchus 2.4 cm in short axis on image 44 series 4, with some extrinsic narrowing of the left mainstem bronchus. This adenopathy could certainly be affecting the recurrent laryngeal nerve on the left. Moderate-sized hiatal hernia. Lungs/Pleura: Partially cavitary 2.5 by 1.9 by 1.8 cm left lower lobe nodule, image 69 series 8. Mild scarring in both lower lobes. Upper Abdomen: Left upper quadrant nodule adjacent to the splenic flexure, lower density than the spleen, 1.9 by 1.4 cm, likely malignancy. Similar 0.6 cm nodule on image 88 series 4 in the left upper quadrant adipose tissue. Another 4 mm left omental nodule is present on image 126 series 4. Another nodule just below the right hepatic lobe measures 0.6 cm in diameter on image 130 series 4. Nonspecific right adrenal mass 1.8 by 1.2 cm, image 109 series 4 Musculoskeletal: There are few scattered subcutaneous nodules concerning for possible malignancy. Index nodule in the left upper breast 1.5 by 1.2 cm, image 11 series 4. Index nodule in the left lower anterior chest 1.3 by 1.0 cm on image 72 series 4. Mild lower thoracic kyphosis. No definite bony metastatic lesion observed. IMPRESSION: 1. 2.5 cm partially cavitary left lower lobe nodule with extensive low-density adenopathy in the left lower neck, left supraclavicular region, left mediastinum, left hilum, and left upper quadrant of the abdomen, compatible with malignancy. 2. Scattered subcutaneous nodules in the chest, and several left upper quadrant omental/paracolic nodules, suspicious for  malignancy. 3. Nonspecific right adrenal mass 1.8 by 1.2 cm. 4. Moderate-sized hiatal hernia. 5. Coronary and aortic atherosclerosis. Aortic Atherosclerosis (ICD10-I70.0). Electronically Signed   By: Van Clines M.D.   On: 11/20/2022 10:31   CT SOFT TISSUE NECK W CONTRAST  Result Date: 11/20/2022 CLINICAL DATA:  Left focal cord paralysis since August. Smoking history. EXAM: CT NECK WITH CONTRAST TECHNIQUE: Multidetector CT imaging of the neck was performed using the standard protocol following the bolus administration of intravenous contrast. RADIATION DOSE REDUCTION: This exam was performed according to the departmental dose-optimization program which includes automated exposure control, adjustment of the mA and/or kV according to patient size and/or use of iterative reconstruction technique. CONTRAST:  46mL ISOVUE-300 IOPAMIDOL (ISOVUE-300) INJECTION 61% COMPARISON:  None Available. FINDINGS: Pharynx and larynx: No sign of mucosal or submucosal mass lesion. Patient does have evidence of recurrent laryngeal nerve dysfunction on the left with mild asymmetric prominence of the piriform sinus, vocal fold near  the midline, and mild dilatation of the left laryngeal ventricle. Salivary glands: Parotid and submandibular glands are normal. Thyroid: Normal Lymph nodes: No lymphadenopathy on the right. On the left, there is lower neck lymphadenopathy beginning at the level 4 nodal station. Low-density node image 59 shows a diameter of 8 x 10 mm. Low-density node at level 6 axial image 65 measures 2.2 x 2.8 cm in size. Vascular: No abnormal vascular finding in the neck. Limited intracranial: Normal Visualized orbits: Normal Mastoids and visualized paranasal sinuses: Clear Skeleton: Mid cervical spondylosis. No evidence of lytic or destructive bone lesion. Upper chest: See results of chest CT. Extensive lymphadenopathy undoubtedly responsible for the left recurrent laryngeal nerve compromise. Other: None  IMPRESSION: 1. Lymphadenopathy in the left lower neck beginning at the level 4 nodal station and extending to the level 6 nodal station. The largest node measures 2.2 x 2.8 cm in size at level 6. This is consistent with metastatic involvement from lung cancer. See results of chest CT. 2. Patient does have evidence of recurrent laryngeal nerve dysfunction on the left with mild asymmetric prominence of the piriform sinus, vocal fold near the midline, and mild dilatation of the left laryngeal ventricle. Electronically Signed   By: Nelson Chimes M.D.   On: 11/20/2022 07:59    Labs:  CBC: Recent Labs    08/02/22 1112 11/22/22 0950  WBC 8.5 8.8  HGB 13.3 13.5  HCT 41.0 40.4  PLT 291 288    COAGS: No results for input(s): "INR", "APTT" in the last 8760 hours.  BMP: Recent Labs    08/02/22 1112 11/22/22 0950  NA 139 139  K 4.0 4.1  CL 100 102  CO2 24 31  GLUCOSE 93 106*  BUN 16 26*  CALCIUM 9.9 10.5*  CREATININE 0.86 0.89  GFRNONAA  --  >60    LIVER FUNCTION TESTS: Recent Labs    08/02/22 1112 11/22/22 0950  BILITOT 0.3 0.6  AST 40 29  ALT 33* 33  ALKPHOS 94 108  PROT 7.0 6.8  ALBUMIN 4.8 4.3    TUMOR MARKERS: No results for input(s): "AFPTM", "CEA", "CA199", "CHROMGRNA" in the last 8760 hours.  Assessment and Plan: 77 y.o. female, remote smoker, with past medical history of depression, GERD, hypercholesterolemia, hypertension, left vocal cord paralysis who presents now with likely metastatic neoplasm and imaging showing left lower lobe lung nodule in addition to extensive lymphadenopathy involving the neck, left hilar-mediastinal region along with multiple subcutaneous nodules.  Patient has no past history of malignancy.  She presents today for image guided biopsy of either left anterior chest nodule/other subcutaneous nodule versus left supraclavicular lymph node.Risks and benefits of procedure was discussed with the patient /granddaughter including, but not limited to  bleeding, infection, damage to adjacent structures or low yield requiring additional tests.  All of the questions were answered and there is agreement to proceed.  Consent signed and in chart.  LABS PENDING  Thank you for this interesting consult.  I greatly enjoyed meeting KATARYNA MCQUILKIN and look forward to participating in their care.  A copy of this report was sent to the requesting provider on this date.  Electronically Signed: D. Rowe Robert, PA-C 12/16/2022, 12:42 PM   I spent a total of  25 minutes   in face to face in clinical consultation, greater than 50% of which was counseling/coordinating care for image guided biopsy of left anterior chest nodule versus left supraclavicular lymph node

## 2022-12-16 NOTE — Procedures (Signed)
Interventional Radiology Procedure Note  Procedure: [name] core biopsy of LEFT supraclavicular LN   Complications: None  Estimated Blood Loss: None  Recommendations: - DC home   Signed,  Criselda Peaches, MD

## 2022-12-18 LAB — SURGICAL PATHOLOGY

## 2022-12-19 ENCOUNTER — Inpatient Hospital Stay: Payer: Medicare HMO

## 2022-12-19 ENCOUNTER — Inpatient Hospital Stay: Payer: Medicare HMO | Attending: Internal Medicine | Admitting: Internal Medicine

## 2022-12-19 ENCOUNTER — Other Ambulatory Visit: Payer: Self-pay

## 2022-12-19 ENCOUNTER — Encounter: Payer: Self-pay | Admitting: Internal Medicine

## 2022-12-19 VITALS — BP 148/79 | HR 80 | Temp 99.0°F | Resp 18 | Wt 144.5 lb

## 2022-12-19 DIAGNOSIS — Z90710 Acquired absence of both cervix and uterus: Secondary | ICD-10-CM | POA: Insufficient documentation

## 2022-12-19 DIAGNOSIS — R5383 Other fatigue: Secondary | ICD-10-CM | POA: Insufficient documentation

## 2022-12-19 DIAGNOSIS — E538 Deficiency of other specified B group vitamins: Secondary | ICD-10-CM | POA: Insufficient documentation

## 2022-12-19 DIAGNOSIS — C778 Secondary and unspecified malignant neoplasm of lymph nodes of multiple regions: Secondary | ICD-10-CM | POA: Insufficient documentation

## 2022-12-19 DIAGNOSIS — Z5112 Encounter for antineoplastic immunotherapy: Secondary | ICD-10-CM | POA: Diagnosis not present

## 2022-12-19 DIAGNOSIS — D72829 Elevated white blood cell count, unspecified: Secondary | ICD-10-CM | POA: Insufficient documentation

## 2022-12-19 DIAGNOSIS — C3492 Malignant neoplasm of unspecified part of left bronchus or lung: Secondary | ICD-10-CM | POA: Diagnosis not present

## 2022-12-19 DIAGNOSIS — Z5111 Encounter for antineoplastic chemotherapy: Secondary | ICD-10-CM | POA: Insufficient documentation

## 2022-12-19 DIAGNOSIS — I6782 Cerebral ischemia: Secondary | ICD-10-CM | POA: Diagnosis not present

## 2022-12-19 DIAGNOSIS — K1230 Oral mucositis (ulcerative), unspecified: Secondary | ICD-10-CM | POA: Diagnosis not present

## 2022-12-19 DIAGNOSIS — C3412 Malignant neoplasm of upper lobe, left bronchus or lung: Secondary | ICD-10-CM | POA: Insufficient documentation

## 2022-12-19 DIAGNOSIS — Z87891 Personal history of nicotine dependence: Secondary | ICD-10-CM | POA: Insufficient documentation

## 2022-12-19 DIAGNOSIS — Z79899 Other long term (current) drug therapy: Secondary | ICD-10-CM | POA: Diagnosis not present

## 2022-12-19 DIAGNOSIS — J3489 Other specified disorders of nose and nasal sinuses: Secondary | ICD-10-CM | POA: Insufficient documentation

## 2022-12-19 MED ORDER — FOLIC ACID 1 MG PO TABS: 1.0000 mg | ORAL_TABLET | Freq: Every day | ORAL | 4 refills | Status: DC

## 2022-12-19 MED ORDER — PROCHLORPERAZINE MALEATE 10 MG PO TABS: 10.0000 mg | ORAL_TABLET | Freq: Four times a day (QID) | ORAL | 0 refills | Status: DC | PRN

## 2022-12-19 MED ORDER — CYANOCOBALAMIN 1000 MCG/ML IJ SOLN
1000.0000 ug | Freq: Once | INTRAMUSCULAR | Status: AC
  Administered 2022-12-19: 1000 ug via INTRAMUSCULAR
  Filled 2022-12-19: qty 1

## 2022-12-19 NOTE — Patient Instructions (Signed)
Vitamin B12 Deficiency Vitamin B12 deficiency occurs when the body does not have enough of this important vitamin. The body needs this vitamin: To make red blood cells. To make DNA. This is the genetic material inside cells. To help the nerves work properly so they can carry messages from the brain to the body. Vitamin B12 deficiency can cause health problems, such as not having enough red blood cells in the blood (anemia). This can lead to nerve damage if untreated. What are the causes? This condition may be caused by: Not eating enough foods that contain vitamin B12. Not having enough stomach acid and digestive fluids to properly absorb vitamin B12 from the food that you eat. Having certain diseases that make it hard to absorb vitamin B12. These diseases include Crohn's disease, chronic pancreatitis, and cystic fibrosis. An autoimmune disorder in which the body does not make enough of a protein (intrinsic factor) within the stomach, resulting in not enough absorption of vitamin B12. Having a surgery in which part of the stomach or small intestine is removed. Taking certain medicines that make it hard for the body to absorb vitamin B12. These include: Heartburn medicines, such as antacids and proton pump inhibitors. Some medicines that are used to treat diabetes. What increases the risk? The following factors may make you more likely to develop a vitamin B12 deficiency: Being an older adult. Eating a vegetarian or vegan diet that does not include any foods that come from animals. Eating a poor diet while you are pregnant. Taking certain medicines. Having alcoholism. What are the signs or symptoms? In some cases, there are no symptoms of this condition. If the condition leads to anemia or nerve damage, various symptoms may occur, such as: Weakness. Tiredness (fatigue). Loss of appetite. Numbness or tingling in your hands and feet. Redness and burning of the tongue. Depression,  confusion, or memory problems. Trouble walking. If anemia is severe, symptoms can include: Shortness of breath. Dizziness. Rapid heart rate. How is this diagnosed? This condition may be diagnosed with a blood test to measure the level of vitamin B12 in your blood. You may also have other tests, including: A group of tests that measure certain characteristics of blood cells (complete blood count, CBC). A blood test to measure intrinsic factor. A procedure where a thin tube with a camera on the end is used to look into your stomach or intestines (endoscopy). Other tests may be needed to discover the cause of the deficiency. How is this treated? Treatment for this condition depends on the cause. This condition may be treated by: Changing your eating and drinking habits, such as: Eating more foods that contain vitamin B12. Drinking less alcohol or no alcohol. Getting vitamin B12 injections. Taking vitamin B12 supplements by mouth (orally). Your health care provider will tell you which dose is best for you. Follow these instructions at home: Eating and drinking  Include foods in your diet that come from animals and contain a lot of vitamin B12. These include: Meats and poultry. This includes beef, pork, chicken, turkey, and organ meats, such as liver. Seafood. This includes clams, rainbow trout, salmon, tuna, and haddock. Eggs. Dairy foods such as milk, yogurt, and cheese. Eat foods that have vitamin B12 added to them (are fortified), such as ready-to-eat breakfast cereals. Check the label on the package to see if a food is fortified. The items listed above may not be a complete list of foods and beverages you can eat and drink. Contact a dietitian for   more information. Alcohol use Do not drink alcohol if: Your health care provider tells you not to drink. You are pregnant, may be pregnant, or are planning to become pregnant. If you drink alcohol: Limit how much you have to: 0-1 drink a  day for women. 0-2 drinks a day for men. Know how much alcohol is in your drink. In the [place] one drink equals one 12 oz bottle of beer (355 mL), one 5 oz glass of wine (148 mL), or one 1 oz glass of hard liquor (44 mL). General instructions Get vitamin B12 injections if told to by your health care provider. Take supplements only as told by your health care provider. Follow the directions carefully. Keep all follow-up visits. This is important. Contact a health care provider if: Your symptoms come back. Your symptoms get worse or do not improve with treatment. Get help right away: You develop shortness of breath. You have a rapid heart rate. You have chest pain. You become dizzy or you faint. These symptoms may be an emergency. Get help right away. Call 911. Do not wait to see if the symptoms will go away. Do not drive yourself to the hospital. Summary Vitamin B12 deficiency occurs when the body does not have enough of this important vitamin. Common causes include not eating enough foods that contain vitamin B12, not being able to absorb vitamin B12 from the food that you eat, having a surgery in which part of the stomach or small intestine is removed, or taking certain medicines. Eat foods that have vitamin B12 in them. Treatment may include making a change in the way you eat and drink, getting vitamin B12 injections, or taking vitamin B12 supplements. This information is not intended to replace advice given to you by your health care provider. Make sure you discuss any questions you have with your health care provider. Document Revised: 07/20/2021 Document Reviewed: 07/20/2021 [employer] Patient Education  2023 [employer]  

## 2022-12-19 NOTE — Progress Notes (Signed)
START OFF PATHWAY REGIMEN - Non-Small Cell Lung   [id number]:Pembrolizumab 200 mg  IV D1 + Pemetrexed 500 mg/m2 IV D1 + Carboplatin AUC=5 IV D1 q21 Days:   A cycle is every 21 days:     Pembrolizumab      Pemetrexed      Carboplatin   **Always confirm dose/schedule in your pharmacy ordering system**  Patient Characteristics: Stage IV Metastatic, Nonsquamous, Awaiting Molecular Test Results and Need to Start Chemotherapy, PS = 0, 1 Therapeutic Status: Stage IV Metastatic Histology: Nonsquamous Cell Broad Molecular Profiling Status: Awaiting Molecular Test Results and Need to Start Chemotherapy ECOG Performance Status: 1 Intent of Therapy: Non-Curative / Palliative Intent, Discussed with Patient

## 2022-12-19 NOTE — Progress Notes (Signed)
[place] [phone]   [phone]  OFFICE PROGRESS NOTE  [name], MD [place] [address]  DIAGNOSIS: Stage IV (T1c, N3, M1 C) non-small cell lung cancer, adenocarcinoma presented with left upper lobe metastatic neoplasm presented with left lower lobe lung nodule in addition to extensive lymphadenopathy involving the neck, left hilar and mediastinal lymphadenopathy as well as upper abdomen lymph nodes and multiple subcutaneous/muscular nodules diagnosed in January 2024.  Molecular studies and PD-L1 expression are still pending  PRIOR THERAPY: None  CURRENT THERAPY: Systemic chemotherapy with carboplatin for AUC of 5, Alimta 500 Mg/M2 and Keytruda 200 Mg IV every 3 weeks.  First dose is planned for December 30, 2022 but will not start until the molecular studies are available and negative for actionable mutations.  INTERVAL HISTORY: Patricia [name] 77 y.o. female returns to the clinic today for follow-up visit accompanied by her granddaughter, Patricia [name].  The patient is feeling fine today with no concerning complaints except for fatigue and enlarging subcutaneous nodules.  She denied having any current chest pain, shortness of breath except with exertion with no cough or hemoptysis.  She has no nausea, vomiting, diarrhea or constipation.  She has no headache or visual changes.  She had ultrasound-guided core biopsy of a supraclavicular lymph node that was consistent with adenocarcinoma of the lung.  The patient also had a PET scan and MRI of the brain performed recently.  She is here for evaluation and discussion of her treatment options based on the imaging studies and biopsy results.  MEDICAL HISTORY: Past Medical History:  Diagnosis Date   Depression    GERD (gastroesophageal reflux disease)    Hypercholesterolemia    IFG (impaired fasting glucose)    Osteopenia    PONV (postoperative nausea and vomiting)      ALLERGIES:  is allergic to codeine.  MEDICATIONS:  Current Outpatient Medications  Medication Sig Dispense Refill   ALPRAZolam (XANAX) 0.5 MG tablet TAKE (1) TABLET BY MOUTH AT BEDTIME AS NEEDED FOR SLEEP. 30 tablet 2   b complex vitamins capsule Take 1 capsule by mouth daily.     Calcium Carb-Cholecalciferol (CALCIUM 600 + D PO) Take 1 tablet by mouth at bedtime.      Cholecalciferol (VITAMIN D3) 125 MCG (5000 UT) CAPS Take 5,000 Units by mouth daily.     COLLAGEN PO Take 2 each by mouth daily. Chewables     lansoprazole (PREVACID) 30 MG capsule Take one po qd for acid reflux 90 capsule 1   meclizine (ANTIVERT) 25 MG tablet Take one tablet po TID prn dizziness 30 tablet 0   Multiple Vitamin (MULTIVITAMIN WITH MINERALS) TABS tablet Take 1 tablet by mouth daily.     naproxen sodium (ALEVE) 220 MG tablet Take 220 mg by mouth daily as needed (pain).     PARoxetine (PAXIL) 40 MG tablet TAKE 1 TABLET BY MOUTH EVERY DAY IN THE MORNING 90 tablet 2   rosuvastatin (CRESTOR) 20 MG tablet Take 1 tablet (20 mg total) by mouth daily. 100 tablet 1   valsartan-hydrochlorothiazide (DIOVAN-HCT) 160-25 MG tablet TAKE 1 TABLET BY MOUTH EVERY DAY 90 tablet 1   No current facility-administered medications for this visit.    SURGICAL HISTORY:  Past Surgical History:  Procedure Laterality Date   ABDOMINAL HYSTERECTOMY     CATARACT EXTRACTION W/PHACO  09/12/2011   Procedure: CATARACT EXTRACTION PHACO AND INTRAOCULAR LENS PLACEMENT (IOC);  Surgeon: Tonny Branch;  Location: [facility];  Service: Ophthalmology;  Laterality: Left;  CDE: 8.91   CATARACT EXTRACTION W/PHACO  11/18/2011   Procedure: CATARACT EXTRACTION PHACO AND INTRAOCULAR LENS PLACEMENT (IOC);  Surgeon: Tonny Branch;  Location: [facility];  Service: Ophthalmology;  Laterality: Right;  CDE:10.26   COLONOSCOPY  2007   Dr. Gala Romney: internal hemorrhoids, single anal papilla poor prep.    COLONOSCOPY N/A 03/27/2017   Rourk: Normal exam.   ECTOPIC PREGNANCY  SURGERY     removal of right tube   ESOPHAGOGASTRODUODENOSCOPY (EGD) WITH PROPOFOL N/A 09/10/2021   Procedure: ESOPHAGOGASTRODUODENOSCOPY (EGD) WITH PROPOFOL;  Surgeon: Daneil Dolin, MD;  Location: [facility];  Service: Endoscopy;  Laterality: N/A;  8:15AM   MALONEY DILATION N/A 09/10/2021   Procedure: Venia Minks DILATION;  Surgeon: Daneil Dolin, MD;  Location: [facility];  Service: Endoscopy;  Laterality: N/A;   OVARIAN CYST REMOVAL     right   RECTOCELE REPAIR N/A 01/18/2014   Procedure: POSTERIOR REPAIR (RECTOCELE);  Surgeon: Jonnie Kind, MD;  Location: [facility];  Service: Gynecology;  Laterality: N/A;   VAGINAL HYSTERECTOMY N/A 01/18/2014   Procedure: HYSTERECTOMY VAGINAL;  Surgeon: Jonnie Kind, MD;  Location: [facility];  Service: Gynecology;  Laterality: N/A;    REVIEW OF SYSTEMS:  Constitutional: positive for fatigue Eyes: negative Ears, nose, mouth, throat, and face: negative Respiratory: positive for dyspnea on exertion Cardiovascular: negative Gastrointestinal: negative Genitourinary:negative Integument/breast: negative Hematologic/lymphatic: negative Musculoskeletal:negative Neurological: negative Behavioral/Psych: negative Endocrine: negative Allergic/Immunologic: negative   PHYSICAL EXAMINATION: General appearance: alert, cooperative, fatigued, and no distress Head: Normocephalic, without obvious abnormality, atraumatic Neck: no adenopathy, no JVD, supple, symmetrical, trachea midline, and thyroid not enlarged, symmetric, no tenderness/mass/nodules Lymph nodes: Cervical, supraclavicular, and axillary nodes normal. Resp: clear to auscultation bilaterally Back: symmetric, no curvature. ROM normal. No CVA tenderness. Cardio: regular rate and rhythm, S1, S2 normal, no murmur, click, rub or gallop GI: soft, non-tender; bowel sounds normal; no masses,  no organomegaly Extremities: extremities normal, atraumatic, no cyanosis or edema Neurologic: Alert and oriented  X 3, normal strength and tone. Normal symmetric reflexes. Normal coordination and gait  ECOG PERFORMANCE STATUS: 1 - Symptomatic but completely ambulatory  Blood pressure (!) 148/79, pulse 80, temperature 99 F (37.2 C), temperature source Temporal, resp. rate 18, weight 144 lb 8 oz (65.5 kg), SpO2 98 %.  LABORATORY DATA: Lab Results  Component Value Date   WBC 12.4 (H) 12/16/2022   HGB 13.4 12/16/2022   HCT 41.8 12/16/2022   MCV 87.6 12/16/2022   PLT 292 12/16/2022      Chemistry      Component Value Date/Time   NA 139 12/16/2022 1221   NA 139 08/02/2022 1112   K 3.6 12/16/2022 1221   CL 103 12/16/2022 1221   CO2 25 12/16/2022 1221   BUN 16 12/16/2022 1221   BUN 16 08/02/2022 1112   CREATININE 0.74 12/16/2022 1221      Component Value Date/Time   CALCIUM 9.7 12/16/2022 1221   ALKPHOS 108 11/22/2022 0950   AST 29 11/22/2022 0950   ALT 33 11/22/2022 0950   BILITOT 0.6 11/22/2022 0950   BILITOT 0.3 08/02/2022 1112       RADIOGRAPHIC STUDIES: [name] CORE BIOPSY (LYMPH NODES)  Result Date: 12/16/2022 INDICATION: History of non-small cell lung cancer with left supraclavicular hypermetabolic lymph node. She presents for CT-guided biopsy of the same. EXAM: ULTRASOUND BIOPSY OF THE LYMPH NODES MEDICATIONS: None. ANESTHESIA/SEDATION: Moderate (conscious) sedation was employed during this procedure.  A total of Versed 2 mg and Fentanyl 100 mcg was administered intravenously. Moderate Sedation Time: 13 minutes. The patient's level of consciousness and vital signs were monitored continuously by radiology nursing throughout the procedure under my direct supervision. FLUOROSCOPY TIME:  None. COMPLICATIONS: None immediate. PROCEDURE: Informed written consent was obtained from the patient after a thorough discussion of the procedural risks, benefits and alternatives. All questions were addressed. Maximal Sterile Barrier Technique was utilized including caps, mask, sterile gowns, sterile gloves,  sterile drape, hand hygiene and skin antiseptic. A timeout was performed prior to the initiation of the procedure. Ultrasound evaluation of the left supraclavicular space identifies the large heterogeneous and pathologic appearing lymph node. The overlying skin was sterilely prepped and draped in the standard fashion. Local anesthesia was attained by infiltration with 1% lidocaine. A small dermatotomy was made. Multiple 18 gauge core biopsies were obtained using the [employer] mission automated biopsy device. Biopsy specimens were placed in formalin and delivered to pathology for further analysis. Post biopsy imaging demonstrates no evidence of complication. The patient tolerated the procedure well. IMPRESSION: Ultrasound-guided core biopsy of left supraclavicular lymphadenopathy. Electronically Signed   By: Jacqulynn Cadet M.D.   On: 12/16/2022 17:38   NM PET Image Initial (PI) Skull Base To Thigh (F-18 FDG)  Result Date: 12/11/2022 CLINICAL DATA:  Initial treatment strategy for non-small cell lung cancer. EXAM: NUCLEAR MEDICINE PET SKULL BASE TO THIGH TECHNIQUE: 7.1 mCi F-18 FDG was injected intravenously. Full-ring PET imaging was performed from the skull base to thigh after the radiotracer. CT data was obtained and used for attenuation correction and anatomic localization. Fasting blood glucose: 93 mg/dl COMPARISON:  None Available. FINDINGS: Mediastinal blood pool activity: SUV max 2.96 Liver activity: SUV max NA NECK: Hypometabolism of the LEFT focal cord consistent with paralysis. Large hypermetabolic LEFT supraclavicular node with SUV max equal 20.1 and measuring 16 mm short axis image 30. Incidental CT findings: None. CHEST: Large hypermetabolic AP window mass measuring 2.7 cm with SUV max equal 23.9 (image 52). LEFT lobe nodule measuring 2.3 cm with SUV max 10.1. Within the subcutaneous tissue of the thorax there are multiple hypermetabolic soft tissue nodules. Example in the nodule deep to the LEFT breast  tissue measuring 10 mm (image 50) with SUV max equal 7.8. Nodule superficial to the RIGHT scapula measuring 8 mm with SUV max equal 26.5. Nodule in the upper RIGHT arm measuring 16 mm SUV max equal 13.3. Approximately 12 nodules Incidental CT findings: None. ABDOMEN/PELVIS: Enlarged hypermetabolic RIGHT adrenal gland with SUV max equal 9.6. Nodule in the peritoneal ventral peritoneal surface along the linea alba measures 7 mm SUV max equal 21.8 on image 134. Retroperitoneal nodule adjacent to the LEFT kidney SUV max equal 8.4. Several peritoneal nodules in LEFT upper quadrant. For example 17 mm nodule with SUV max equal 4.1. As in the thorax, multiple cutaneous nodules are intensely hypermetabolic. Several hypermetabolic muscular nodules. For example in the RIGHT gluteus maximus muscle metabolic nodule measuring 9.7 SUV max on 144 Incidental CT findings: None. SKELETON: No focal hypermetabolic activity to suggest skeletal metastasis. Incidental CT findings: None. IMPRESSION: 1. Hypermetabolic LEFT lobe pulmonary nodule consistent with primary bronchogenic carcinoma. 2. Hypermetabolic mediastinal and LEFT supraclavicular adenopathy. The enlarged AP window nodal metastasis impinges upon the LEFT recurrent laryngeal nerve (LEFT focal cord paralysis). 3. Multiple hypermetabolic cutaneous nodules within the chest, abdomen, pelvis and extremities. 4. RIGHT adrenal gland hypermetabolic metastasis. 5. Peritoneal and  retroperitoneal nodular  metastasis. 6. Muscular metastasis primarily in  the pelvis. Electronically Signed   By: Suzy Bouchard M.D.   On: 12/11/2022 09:40   MR BRAIN W WO CONTRAST  Result Date: 11/29/2022 CLINICAL DATA:  Provided history: Malignant neoplasm of unspecified part of unspecified bronchus or lung. Non-small cell lung cancer, staging. EXAM: MRI HEAD WITHOUT AND WITH CONTRAST TECHNIQUE: Multiplanar, multiecho pulse sequences of the brain and surrounding structures were obtained without and  with intravenous contrast. CONTRAST:  6.72mL GADAVIST GADOBUTROL 1 MMOL/ML IV SOLN COMPARISON:  None. FINDINGS: Brain: Mild cerebral atrophy. Multifocal T2 FLAIR hyperintense signal abnormality within the cerebral white matter, nonspecific but compatible with mild-to-moderate chronic small vessel ischemic disease. Moderate chronic small vessel ischemic changes within the pons. Chronic small vessel ischemic changes are also present within the right basal ganglia. T2 hyperintense foci within the left thalamus, favored to reflect prominent perivascular spaces. Cerebellar tonsillar ectopia on the left with the left cerebellar tonsil extending 4 mm below the level of the foramen magnum. This does not meet measurement criteria for a Chiari I malformation. There is no significant crowding at the level of the foramen magnum. There is no acute infarct. No evidence of an intracranial mass. No chronic intracranial blood products. No extra-axial fluid collection. No midline shift. No pathologic intracranial enhancement identified. Vascular: Maintained flow voids within the proximal large arterial vessels. Skull and upper cervical spine: No focal suspicious marrow lesion. Incompletely assessed cervical spondylosis. Sinuses/Orbits: No mass or acute finding within the imaged orbits. Prior bilateral ocular lens replacement. No significant paranasal sinus disease. IMPRESSION: 1. No evidence of intracranial metastatic disease. 2. Sequelae of chronic small vessel ischemia, as described. 3. Mild cerebral atrophy. Electronically Signed   By: Kellie Simmering D.O.   On: 11/29/2022 19:06   CT CHEST W CONTRAST  Result Date: 11/20/2022 CLINICAL DATA:  Left vocal cord paralysis. Reported "knots around sides and chest" for 2 months. EXAM: CT CHEST WITH CONTRAST TECHNIQUE: Multidetector CT imaging of the chest was performed during intravenous contrast administration. RADIATION DOSE REDUCTION: This exam was performed according to the  departmental dose-optimization program which includes automated exposure control, adjustment of the mA and/or kV according to patient size and/or use of iterative reconstruction technique. CONTRAST:  59mL ISOVUE-300 IOPAMIDOL (ISOVUE-300) INJECTION 61% COMPARISON:  Chest radiograph 03/26/2005 FINDINGS: Cardiovascular: Low-density tumor/adenopathy surrounds the brachiocephalic artery along the top of the aortic arch. This also surrounds the left common carotid artery. No substantial degree of extrinsic narrowing although there is atheromatous calcification in the branch vasculature and thoracic aorta. Left anterior descending and right coronary artery atherosclerotic calcification is observed. Mediastinum/Nodes: Left supraclavicular node extending into level IV of the lower neck 1.8 cm in short axis diameter on image 5 series 4. Conglomerate low-density adenopathy surrounding the branch vessels, 3.4 cm in short axis on image 24 series 4. AP window node 2.6 cm in short axis, image 38 series 4. Adjacent adenopathy to the left of the left mainstem bronchus 2.4 cm in short axis on image 44 series 4, with some extrinsic narrowing of the left mainstem bronchus. This adenopathy could certainly be affecting the recurrent laryngeal nerve on the left. Moderate-sized hiatal hernia. Lungs/Pleura: Partially cavitary 2.5 by 1.9 by 1.8 cm left lower lobe nodule, image 69 series 8. Mild scarring in both lower lobes. Upper Abdomen: Left upper quadrant nodule adjacent to the splenic flexure, lower density than the spleen, 1.9 by 1.4 cm, likely malignancy. Similar 0.6 cm nodule on image 88 series 4 in the left upper quadrant adipose tissue. Another  4 mm left omental nodule is present on image 126 series 4. Another nodule just below the right hepatic lobe measures 0.6 cm in diameter on image 130 series 4. Nonspecific right adrenal mass 1.8 by 1.2 cm, image 109 series 4 Musculoskeletal: There are few scattered subcutaneous nodules  concerning for possible malignancy. Index nodule in the left upper breast 1.5 by 1.2 cm, image 11 series 4. Index nodule in the left lower anterior chest 1.3 by 1.0 cm on image 72 series 4. Mild lower thoracic kyphosis. No definite bony metastatic lesion observed. IMPRESSION: 1. 2.5 cm partially cavitary left lower lobe nodule with extensive low-density adenopathy in the left lower neck, left supraclavicular region, left mediastinum, left hilum, and left upper quadrant of the abdomen, compatible with malignancy. 2. Scattered subcutaneous nodules in the chest, and several left upper quadrant omental/paracolic nodules, suspicious for malignancy. 3. Nonspecific right adrenal mass 1.8 by 1.2 cm. 4. Moderate-sized hiatal hernia. 5. Coronary and aortic atherosclerosis. Aortic Atherosclerosis (ICD10-I70.0). Electronically Signed   By: Van Clines M.D.   On: 11/20/2022 10:31   CT SOFT TISSUE NECK W CONTRAST  Result Date: 11/20/2022 CLINICAL DATA:  Left focal cord paralysis since August. Smoking history. EXAM: CT NECK WITH CONTRAST TECHNIQUE: Multidetector CT imaging of the neck was performed using the standard protocol following the bolus administration of intravenous contrast. RADIATION DOSE REDUCTION: This exam was performed according to the departmental dose-optimization program which includes automated exposure control, adjustment of the mA and/or kV according to patient size and/or use of iterative reconstruction technique. CONTRAST:  57mL ISOVUE-300 IOPAMIDOL (ISOVUE-300) INJECTION 61% COMPARISON:  None Available. FINDINGS: Pharynx and larynx: No sign of mucosal or submucosal mass lesion. Patient does have evidence of recurrent laryngeal nerve dysfunction on the left with mild asymmetric prominence of the piriform sinus, vocal fold near the midline, and mild dilatation of the left laryngeal ventricle. Salivary glands: Parotid and submandibular glands are normal. Thyroid: Normal Lymph nodes: No  lymphadenopathy on the right. On the left, there is lower neck lymphadenopathy beginning at the level 4 nodal station. Low-density node image 59 shows a diameter of 8 x 10 mm. Low-density node at level 6 axial image 65 measures 2.2 x 2.8 cm in size. Vascular: No abnormal vascular finding in the neck. Limited intracranial: Normal Visualized orbits: Normal Mastoids and visualized paranasal sinuses: Clear Skeleton: Mid cervical spondylosis. No evidence of lytic or destructive bone lesion. Upper chest: See results of chest CT. Extensive lymphadenopathy undoubtedly responsible for the left recurrent laryngeal nerve compromise. Other: None IMPRESSION: 1. Lymphadenopathy in the left lower neck beginning at the level 4 nodal station and extending to the level 6 nodal station. The largest node measures 2.2 x 2.8 cm in size at level 6. This is consistent with metastatic involvement from lung cancer. See results of chest CT. 2. Patient does have evidence of recurrent laryngeal nerve dysfunction on the left with mild asymmetric prominence of the piriform sinus, vocal fold near the midline, and mild dilatation of the left laryngeal ventricle. Electronically Signed   By: Nelson Chimes M.D.   On: 11/20/2022 07:59    ASSESSMENT AND PLAN: This is a very pleasant 77 years old white female with Stage IV (T1c, N3, M1 C) non-small cell lung cancer, adenocarcinoma presented with left upper lobe metastatic neoplasm presented with left lower lobe lung nodule in addition to extensive lymphadenopathy involving the neck, left hilar and mediastinal lymphadenopathy as well as upper abdomen lymph nodes and multiple subcutaneous/muscular nodules diagnosed  in January 2024. Molecular studies and PD-L1 expression are still pending.  The patient had a PET scan and MRI brain performed recently.  I personally and independently reviewed the imaging studies and discussed the result and showed the images to the patient and her granddaughter. I  explained to the patient that she has incurable condition and all the treatment will be of palliative nature. I gave her the option of palliative care and hospice referral versus consideration of palliative systemic chemotherapy with carboplatin for AUC of 5, Alimta 500 Mg/M2 and Keytruda 200 Mg IV every 3 weeks if she has no actionable mutations. The patient is interested in treatment and I will arrange for her to start the first cycle of this treatment on December 30, 2022 but we will not start the treatment until the molecular studies by [place] or [facility] are available. The patient will receive vitamin B12 injection today. I will send prescription for folic acid and Compazine to her pharmacy.  I will see her back for follow-up visit on the day of her treatment. The patient will have a chemotherapy education class before the first dose of her treatment. She will have a guardian blood test performed today. She was advised to call immediately if she has any other concerning symptoms in the interval. The patient voices understanding of current disease status and treatment options and is in agreement with the current care plan.  All questions were answered. The patient knows to call the clinic with any problems, questions or concerns. We can certainly see the patient much sooner if necessary.  The total time spent in the appointment was 55 minutes.  Disclaimer: This note was dictated with voice recognition software. Similar sounding words can inadvertently be transcribed and may not be corrected upon review.

## 2022-12-20 ENCOUNTER — Other Ambulatory Visit: Payer: Self-pay

## 2022-12-21 ENCOUNTER — Other Ambulatory Visit: Payer: Self-pay

## 2022-12-23 ENCOUNTER — Telehealth: Payer: Self-pay | Admitting: Internal Medicine

## 2022-12-23 NOTE — Telephone Encounter (Signed)
Called patient regarding upcoming January - March appointments, patient is notified.

## 2022-12-25 DIAGNOSIS — C3492 Malignant neoplasm of unspecified part of left bronchus or lung: Secondary | ICD-10-CM | POA: Diagnosis not present

## 2022-12-26 ENCOUNTER — Encounter (HOSPITAL_COMMUNITY): Payer: Self-pay | Admitting: Internal Medicine

## 2022-12-26 DIAGNOSIS — C3492 Malignant neoplasm of unspecified part of left bronchus or lung: Secondary | ICD-10-CM | POA: Diagnosis not present

## 2022-12-27 ENCOUNTER — Encounter: Payer: Self-pay | Admitting: Internal Medicine

## 2022-12-27 NOTE — Progress Notes (Signed)
[facility] OFFICE PROGRESS NOTE  Patricia Sciara, MD [address] [place] [address]  DIAGNOSIS:  Stage IV (T1c, N3, M1 C) non-small cell lung cancer, adenocarcinoma presented with left upper lobe metastatic neoplasm presented with left lower lobe lung nodule in addition to extensive lymphadenopathy involving the neck, left hilar and mediastinal lymphadenopathy as well as upper abdomen lymph nodes and multiple subcutaneous/muscular nodules diagnosed in January 2024.   Molecular studies: [id number]  PD-L1 expression: pending.   PRIOR THERAPY: None  CURRENT THERAPY: Systemic chemotherapy with carboplatin for AUC of 5, Alimta 500 Mg/M2 and Keytruda 200 Mg IV every 3 weeks.  First dose is planned for December 30, 2022.  INTERVAL HISTORY: Patricia [name] 77 y.o. female returns to the clinic today for a follow-up visit accompanied by her granddaughter.  The patient was recently diagnosed with lung cancer.  The plan is to undergo systemic chemotherapy and immunotherapy since the patient does not have any actionable mutations in the first-line setting.  Her PD-L1 expression is pending.  Since last being seen by Dr. Arbutus Ped on 12/19/2022, the patient denies any changes in her health.  She denies any fever, chills, or night sweats. She lost a few pounds because she had a migraine over the weekend. Of note, her staging brain MRI from last month was negative for metastatic disease to the brain. She has similar hoarseness and dyspnea on exertion which is stable. She used to be active and go to the gym 3-4x per week before her symptom onset which started in August 2023. Denies any chest pain, cough, or hemoptysis.  Denies any nausea, vomiting, diarrhea, or constipation. Denies any rashes or skin changes.  She is here today for evaluation repeat blood work before undergoing cycle #1.   MEDICAL HISTORY: Past Medical History:  Diagnosis Date   Depression    GERD (gastroesophageal  reflux disease)    Hypercholesterolemia    IFG (impaired fasting glucose)    Osteopenia    PONV (postoperative nausea and vomiting)     ALLERGIES:  is allergic to codeine.  MEDICATIONS:  Current Outpatient Medications  Medication Sig Dispense Refill   ALPRAZolam (XANAX) 0.5 MG tablet TAKE (1) TABLET BY MOUTH AT BEDTIME AS NEEDED FOR SLEEP. 30 tablet 2   b complex vitamins capsule Take 1 capsule by mouth daily.     Calcium Carb-Cholecalciferol (CALCIUM 600 + D PO) Take 1 tablet by mouth at bedtime.      Cholecalciferol (VITAMIN D3) 125 MCG (5000 UT) CAPS Take 5,000 Units by mouth daily.     COLLAGEN PO Take 2 each by mouth daily. Chewables     folic acid (FOLVITE) 1 MG tablet Take 1 tablet (1 mg total) by mouth daily. 30 tablet 4   lansoprazole (PREVACID) 30 MG capsule Take one po qd for acid reflux 90 capsule 1   meclizine (ANTIVERT) 25 MG tablet Take one tablet po TID prn dizziness 30 tablet 0   Multiple Vitamin (MULTIVITAMIN WITH MINERALS) TABS tablet Take 1 tablet by mouth daily.     naproxen sodium (ALEVE) 220 MG tablet Take 220 mg by mouth daily as needed (pain).     PARoxetine (PAXIL) 40 MG tablet TAKE 1 TABLET BY MOUTH EVERY DAY IN THE MORNING 90 tablet 2   prochlorperazine (COMPAZINE) 10 MG tablet Take 1 tablet (10 mg total) by mouth every 6 (six) hours as needed for nausea or vomiting. 30 tablet 0   rosuvastatin (CRESTOR) 20 MG  tablet Take 1 tablet (20 mg total) by mouth daily. 100 tablet 1   valsartan-hydrochlorothiazide (DIOVAN-HCT) 160-25 MG tablet TAKE 1 TABLET BY MOUTH EVERY DAY 90 tablet 1   No current facility-administered medications for this visit.    SURGICAL HISTORY:  Past Surgical History:  Procedure Laterality Date   ABDOMINAL HYSTERECTOMY     CATARACT EXTRACTION W/PHACO  09/12/2011   Procedure: CATARACT EXTRACTION PHACO AND INTRAOCULAR LENS PLACEMENT (IOC);  Surgeon: Gemma Payor;  Location: [facility];  Service: Ophthalmology;  Laterality: Left;  CDE: 8.91    CATARACT EXTRACTION W/PHACO  11/18/2011   Procedure: CATARACT EXTRACTION PHACO AND INTRAOCULAR LENS PLACEMENT (IOC);  Surgeon: Gemma Payor;  Location: [facility];  Service: Ophthalmology;  Laterality: Right;  CDE:10.26   COLONOSCOPY  2007   Dr. Jena Gauss: internal hemorrhoids, single anal papilla poor prep.    COLONOSCOPY N/A 03/27/2017   Rourk: Normal exam.   ECTOPIC PREGNANCY SURGERY     removal of right tube   ESOPHAGOGASTRODUODENOSCOPY (EGD) WITH PROPOFOL N/A 09/10/2021   Procedure: ESOPHAGOGASTRODUODENOSCOPY (EGD) WITH PROPOFOL;  Surgeon: Corbin Ade, MD;  Location: [facility];  Service: Endoscopy;  Laterality: N/A;  8:15AM   MALONEY DILATION N/A 09/10/2021   Procedure: Elease Hashimoto DILATION;  Surgeon: Corbin Ade, MD;  Location: [facility];  Service: Endoscopy;  Laterality: N/A;   OVARIAN CYST REMOVAL     right   RECTOCELE REPAIR N/A 01/18/2014   Procedure: POSTERIOR REPAIR (RECTOCELE);  Surgeon: Tilda Burrow, MD;  Location: [facility];  Service: Gynecology;  Laterality: N/A;   VAGINAL HYSTERECTOMY N/A 01/18/2014   Procedure: HYSTERECTOMY VAGINAL;  Surgeon: Tilda Burrow, MD;  Location: [facility];  Service: Gynecology;  Laterality: N/A;    REVIEW OF SYSTEMS:   Review of Systems  Constitutional: Positive for weight loss.  Negative for appetite change, chills, fatigue, and fever.   HENT: Positive for hoarseness.  Negative for mouth sores, nosebleeds, sore throat and trouble swallowing.   Eyes: Negative for eye problems and icterus.  Respiratory: Positive for baseline dyspnea exertion.  Negative for cough, hemoptysis,  and wheezing.   Cardiovascular: Negative for chest pain and leg swelling.  Gastrointestinal: Negative for abdominal pain, constipation, diarrhea, nausea and vomiting.  Genitourinary: Negative for bladder incontinence, difficulty urinating, dysuria, frequency and hematuria.   Musculoskeletal: Negative for back pain, gait problem, neck pain and neck stiffness.  Skin:  Negative for itching and rash.  Neurological: Negative for dizziness, extremity weakness, gait problem, headaches, light-headedness and seizures.  Hematological: Negative for adenopathy. Does not bruise/bleed easily.  Psychiatric/Behavioral: Negative for confusion, depression and sleep disturbance. The patient is not nervous/anxious.     PHYSICAL EXAMINATION:  There were no vitals taken for this visit.  ECOG PERFORMANCE STATUS: 1  Physical Exam  Constitutional: Oriented to person, place, and time and well-developed, well-nourished, and in no distress.  HENT:  Head: Normocephalic and atraumatic.  Mouth/Throat: Positive for hoarseness.  Oropharynx is clear and moist. No oropharyngeal exudate.  Eyes: Conjunctivae are normal. Right eye exhibits no discharge. Left eye exhibits no discharge. No scleral icterus.  Neck: Normal range of motion. Neck supple.  Cardiovascular: Normal rate, regular rhythm, normal heart sounds and intact distal pulses.   Pulmonary/Chest: Effort normal and breath sounds normal. No respiratory distress. No wheezes. No rales.  Abdominal: Soft. Bowel sounds are normal. Exhibits no distension and no mass. There is no tenderness.  Musculoskeletal: Normal range of motion. Exhibits no edema.  Lymphadenopathy:    No cervical  adenopathy.  Neurological: Alert and oriented to person, place, and time. Exhibits normal muscle tone. Gait normal. Coordination normal.  Skin: Skin is warm and dry. No rash noted. Not diaphoretic. No erythema. No pallor.  Psychiatric: Mood, memory and judgment normal.  Vitals reviewed.  LABORATORY DATA: Lab Results  Component Value Date   WBC 12.4 (H) 12/16/2022   HGB 13.4 12/16/2022   HCT 41.8 12/16/2022   MCV 87.6 12/16/2022   PLT 292 12/16/2022      Chemistry      Component Value Date/Time   NA 139 12/16/2022 1221   NA 139 08/02/2022 1112   K 3.6 12/16/2022 1221   CL 103 12/16/2022 1221   CO2 25 12/16/2022 1221   BUN 16 12/16/2022  1221   BUN 16 08/02/2022 1112   CREATININE 0.74 12/16/2022 1221      Component Value Date/Time   CALCIUM 9.7 12/16/2022 1221   ALKPHOS 108 11/22/2022 0950   AST 29 11/22/2022 0950   ALT 33 11/22/2022 0950   BILITOT 0.6 11/22/2022 0950   BILITOT 0.3 08/02/2022 1112       RADIOGRAPHIC STUDIES:  [name] CORE BIOPSY (LYMPH NODES)  Result Date: 12/16/2022 INDICATION: History of non-small cell lung cancer with left supraclavicular hypermetabolic lymph node. She presents for CT-guided biopsy of the same. EXAM: ULTRASOUND BIOPSY OF THE LYMPH NODES MEDICATIONS: None. ANESTHESIA/SEDATION: Moderate (conscious) sedation was employed during this procedure. A total of Versed 2 mg and Fentanyl 100 mcg was administered intravenously. Moderate Sedation Time: 13 minutes. The patient's level of consciousness and vital signs were monitored continuously by radiology nursing throughout the procedure under my direct supervision. FLUOROSCOPY TIME:  None. COMPLICATIONS: None immediate. PROCEDURE: Informed written consent was obtained from the patient after a thorough discussion of the procedural risks, benefits and alternatives. All questions were addressed. Maximal Sterile Barrier Technique was utilized including caps, mask, sterile gowns, sterile gloves, sterile drape, hand hygiene and skin antiseptic. A timeout was performed prior to the initiation of the procedure. Ultrasound evaluation of the left supraclavicular space identifies the large heterogeneous and pathologic appearing lymph node. The overlying skin was sterilely prepped and draped in the standard fashion. Local anesthesia was attained by infiltration with 1% lidocaine. A small dermatotomy was made. Multiple 18 gauge core biopsies were obtained using the Bard mission automated biopsy device. Biopsy specimens were placed in formalin and delivered to pathology for further analysis. Post biopsy imaging demonstrates no evidence of complication. The patient tolerated  the procedure well. IMPRESSION: Ultrasound-guided core biopsy of left supraclavicular lymphadenopathy. Electronically Signed   By: Malachy Moan M.D.   On: 12/16/2022 17:38   NM PET Image Initial (PI) Skull Base To Thigh (F-18 FDG)  Result Date: 12/11/2022 CLINICAL DATA:  Initial treatment strategy for non-small cell lung cancer. EXAM: NUCLEAR MEDICINE PET SKULL BASE TO THIGH TECHNIQUE: 7.1 mCi F-18 FDG was injected intravenously. Full-ring PET imaging was performed from the skull base to thigh after the radiotracer. CT data was obtained and used for attenuation correction and anatomic localization. Fasting blood glucose: 93 mg/dl COMPARISON:  None Available. FINDINGS: Mediastinal blood pool activity: SUV max 2.96 Liver activity: SUV max NA NECK: Hypometabolism of the LEFT focal cord consistent with paralysis. Large hypermetabolic LEFT supraclavicular node with SUV max equal 20.1 and measuring 16 mm short axis image 30. Incidental CT findings: None. CHEST: Large hypermetabolic AP window mass measuring 2.7 cm with SUV max equal 23.9 (image 52). LEFT lobe nodule measuring 2.3 cm with SUV max  10.1. Within the subcutaneous tissue of the thorax there are multiple hypermetabolic soft tissue nodules. Example in the nodule deep to the LEFT breast tissue measuring 10 mm (image 50) with SUV max equal 7.8. Nodule superficial to the RIGHT scapula measuring 8 mm with SUV max equal 26.5. Nodule in the upper RIGHT arm measuring 16 mm SUV max equal 13.3. Approximately 12 nodules Incidental CT findings: None. ABDOMEN/PELVIS: Enlarged hypermetabolic RIGHT adrenal gland with SUV max equal 9.6. Nodule in the peritoneal ventral peritoneal surface along the linea alba measures 7 mm SUV max equal 21.8 on image 134. Retroperitoneal nodule adjacent to the LEFT kidney SUV max equal 8.4. Several peritoneal nodules in LEFT upper quadrant. For example 17 mm nodule with SUV max equal 4.1. As in the thorax, multiple cutaneous nodules are  intensely hypermetabolic. Several hypermetabolic muscular nodules. For example in the RIGHT gluteus maximus muscle metabolic nodule measuring 9.7 SUV max on 144 Incidental CT findings: None. SKELETON: No focal hypermetabolic activity to suggest skeletal metastasis. Incidental CT findings: None. IMPRESSION: 1. Hypermetabolic LEFT lobe pulmonary nodule consistent with primary bronchogenic carcinoma. 2. Hypermetabolic mediastinal and LEFT supraclavicular adenopathy. The enlarged AP window nodal metastasis impinges upon the LEFT recurrent laryngeal nerve (LEFT focal cord paralysis). 3. Multiple hypermetabolic cutaneous nodules within the chest, abdomen, pelvis and extremities. 4. RIGHT adrenal gland hypermetabolic metastasis. 5. Peritoneal and  retroperitoneal nodular  metastasis. 6. Muscular metastasis primarily in the pelvis. Electronically Signed   By: Suzy Bouchard M.D.   On: 12/11/2022 09:40   MR BRAIN W WO CONTRAST  Result Date: 11/29/2022 CLINICAL DATA:  Provided history: Malignant neoplasm of unspecified part of unspecified bronchus or lung. Non-small cell lung cancer, staging. EXAM: MRI HEAD WITHOUT AND WITH CONTRAST TECHNIQUE: Multiplanar, multiecho pulse sequences of the brain and surrounding structures were obtained without and with intravenous contrast. CONTRAST:  6.80mL GADAVIST GADOBUTROL 1 MMOL/ML IV SOLN COMPARISON:  None. FINDINGS: Brain: Mild cerebral atrophy. Multifocal T2 FLAIR hyperintense signal abnormality within the cerebral white matter, nonspecific but compatible with mild-to-moderate chronic small vessel ischemic disease. Moderate chronic small vessel ischemic changes within the pons. Chronic small vessel ischemic changes are also present within the right basal ganglia. T2 hyperintense foci within the left thalamus, favored to reflect prominent perivascular spaces. Cerebellar tonsillar ectopia on the left with the left cerebellar tonsil extending 4 mm below the level of the foramen  magnum. This does not meet measurement criteria for a Chiari I malformation. There is no significant crowding at the level of the foramen magnum. There is no acute infarct. No evidence of an intracranial mass. No chronic intracranial blood products. No extra-axial fluid collection. No midline shift. No pathologic intracranial enhancement identified. Vascular: Maintained flow voids within the proximal large arterial vessels. Skull and upper cervical spine: No focal suspicious marrow lesion. Incompletely assessed cervical spondylosis. Sinuses/Orbits: No mass or acute finding within the imaged orbits. Prior bilateral ocular lens replacement. No significant paranasal sinus disease. IMPRESSION: 1. No evidence of intracranial metastatic disease. 2. Sequelae of chronic small vessel ischemia, as described. 3. Mild cerebral atrophy. Electronically Signed   By: Kellie Simmering D.O.   On: 11/29/2022 19:06     ASSESSMENT/PLAN:  This is a very pleasant 77 year old Caucasian female diagnosed with stage IV (T1c, N3, M1 C) non-small cell lung cancer, adenocarcinoma.  The patient presented with a left upper lobe metastatic neoplasm in addition to extensive lymphadenopathy involving the neck, left hilar, mediastinal, and upper abdominal lymph nodes with multiple subcutaneous/muscular nodules.  She was diagnosed in January 2024.  Her molecular studies show she is positive for K-ras G12 C which can be used in the second line setting.  Her PD-L1 expression is pending.   The patient is going to start systemic/palliative chemotherapy and immunotherapy with carboplatin for AUC of 5, Alimta 500 mg/m, Keytruda 200 mg IV every 3 weeks.  Her first cycle of treatment is expected today on 12/30/22   Labs were reviewed.  Recommend that she proceed with cycle #1 today scheduled.  We will see her back for follow-up visit in 1 week to manage any adverse side effects of treatment and for 1 week follow-up visit.  She will have her chemo  education class while in the infusion room today. I did review her regimen with her and printed more information on her AVS.   She is taking her folic acid as prescribed.   Advised to avoid NSAIDs. Preferred is tylenol of headaches.   She has dropped off FMLA paperwork.   The patient was advised to call immediately if she has any concerning symptoms in the interval. The patient voices understanding of current disease status and treatment options and is in agreement with the current care plan. All questions were answered. The patient knows to call the clinic with any problems, questions or concerns. We can certainly see the patient much sooner if necessary   No orders of the defined types were placed in this encounter.    . The total time spent in the appointment was 20-29 minutes.   Kadence Mimbs L Ammy Lienhard, PA-C 12/27/22

## 2022-12-27 NOTE — Progress Notes (Signed)
Patient's granddaughter, Herbert Seta, called oncology clinic inquiring about whether or not the patient, her grandmother, will be getting treatment on 1/22. I was unable to reach her, but will continue to make attempts.  She sees Cassie Heilingoetter that morning and whether or not she receives chemotherapy will be determined by the results of her molecular studies.

## 2022-12-27 NOTE — Progress Notes (Signed)
Was able to follow up with the [facility] about plan for pt's treatment. Pt will be getting chemotherapy on Monday 1/22 after her appointment with Cassie. Herbert Seta states the patient has been taking the folic acid since last Friday. A chemotherapy educator will be able to talk to the patient and Heather during the pt's infusion. I suggested to Herbert Seta that the pt bring items to make her feel comfrotable and for entertainment (books, games, etc.). I also suggested she bring a bag and something to write with as chemo educators usually provide written information. Herbert Seta was appreciative for the information provided. She endorsed no other questions at this time and I told her I would see her and the pt at the pt's appt on Monday.

## 2022-12-30 ENCOUNTER — Inpatient Hospital Stay: Payer: Medicare HMO

## 2022-12-30 ENCOUNTER — Encounter (HOSPITAL_COMMUNITY): Payer: Self-pay | Admitting: Internal Medicine

## 2022-12-30 ENCOUNTER — Other Ambulatory Visit: Payer: Medicare HMO

## 2022-12-30 ENCOUNTER — Other Ambulatory Visit: Payer: Self-pay

## 2022-12-30 ENCOUNTER — Inpatient Hospital Stay: Payer: Medicare HMO | Admitting: Physician Assistant

## 2022-12-30 VITALS — BP 124/77 | HR 100 | Temp 98.2°F | Resp 16 | Wt 139.6 lb

## 2022-12-30 DIAGNOSIS — Z87891 Personal history of nicotine dependence: Secondary | ICD-10-CM | POA: Diagnosis not present

## 2022-12-30 DIAGNOSIS — C778 Secondary and unspecified malignant neoplasm of lymph nodes of multiple regions: Secondary | ICD-10-CM | POA: Diagnosis not present

## 2022-12-30 DIAGNOSIS — Z5111 Encounter for antineoplastic chemotherapy: Secondary | ICD-10-CM

## 2022-12-30 DIAGNOSIS — J3489 Other specified disorders of nose and nasal sinuses: Secondary | ICD-10-CM | POA: Diagnosis not present

## 2022-12-30 DIAGNOSIS — C3492 Malignant neoplasm of unspecified part of left bronchus or lung: Secondary | ICD-10-CM

## 2022-12-30 DIAGNOSIS — R5383 Other fatigue: Secondary | ICD-10-CM | POA: Diagnosis not present

## 2022-12-30 DIAGNOSIS — Z90710 Acquired absence of both cervix and uterus: Secondary | ICD-10-CM | POA: Diagnosis not present

## 2022-12-30 DIAGNOSIS — E538 Deficiency of other specified B group vitamins: Secondary | ICD-10-CM | POA: Diagnosis not present

## 2022-12-30 DIAGNOSIS — C3412 Malignant neoplasm of upper lobe, left bronchus or lung: Secondary | ICD-10-CM | POA: Diagnosis not present

## 2022-12-30 DIAGNOSIS — Z5112 Encounter for antineoplastic immunotherapy: Secondary | ICD-10-CM | POA: Diagnosis not present

## 2022-12-30 DIAGNOSIS — K1230 Oral mucositis (ulcerative), unspecified: Secondary | ICD-10-CM | POA: Diagnosis not present

## 2022-12-30 DIAGNOSIS — D72829 Elevated white blood cell count, unspecified: Secondary | ICD-10-CM | POA: Diagnosis not present

## 2022-12-30 DIAGNOSIS — I6782 Cerebral ischemia: Secondary | ICD-10-CM | POA: Diagnosis not present

## 2022-12-30 DIAGNOSIS — Z79899 Other long term (current) drug therapy: Secondary | ICD-10-CM | POA: Diagnosis not present

## 2022-12-30 LAB — CBC WITH DIFFERENTIAL (CANCER CENTER ONLY)
Abs Immature Granulocytes: 0.07 10*3/uL (ref 0.00–0.07)
Basophils Absolute: 0.1 10*3/uL (ref 0.0–0.1)
Basophils Relative: 1 %
Eosinophils Absolute: 0.8 10*3/uL — ABNORMAL HIGH (ref 0.0–0.5)
Eosinophils Relative: 7 %
HCT: 41.9 % (ref 36.0–46.0)
Hemoglobin: 13.9 g/dL (ref 12.0–15.0)
Immature Granulocytes: 1 %
Lymphocytes Relative: 18 %
Lymphs Abs: 2.3 10*3/uL (ref 0.7–4.0)
MCH: 28.3 pg (ref 26.0–34.0)
MCHC: 33.2 g/dL (ref 30.0–36.0)
MCV: 85.2 fL (ref 80.0–100.0)
Monocytes Absolute: 0.8 10*3/uL (ref 0.1–1.0)
Monocytes Relative: 7 %
Neutro Abs: 8.4 10*3/uL — ABNORMAL HIGH (ref 1.7–7.7)
Neutrophils Relative %: 66 %
Platelet Count: 313 10*3/uL (ref 150–400)
RBC: 4.92 MIL/uL (ref 3.87–5.11)
RDW: 12.4 % (ref 11.5–15.5)
WBC Count: 12.6 10*3/uL — ABNORMAL HIGH (ref 4.0–10.5)
nRBC: 0 % (ref 0.0–0.2)

## 2022-12-30 LAB — CMP (CANCER CENTER ONLY)
ALT: 20 U/L (ref 0–44)
AST: 15 U/L (ref 15–41)
Albumin: 4 g/dL (ref 3.5–5.0)
Alkaline Phosphatase: 100 U/L (ref 38–126)
Anion gap: 9 (ref 5–15)
BUN: 25 mg/dL — ABNORMAL HIGH (ref 8–23)
CO2: 26 mmol/L (ref 22–32)
Calcium: 10.4 mg/dL — ABNORMAL HIGH (ref 8.9–10.3)
Chloride: 104 mmol/L (ref 98–111)
Creatinine: 0.95 mg/dL (ref 0.44–1.00)
GFR, Estimated: >60 mL/min (ref >60)
Glucose, Bld: 93 mg/dL (ref 70–99)
Potassium: 3.7 mmol/L (ref 3.5–5.1)
Sodium: 139 mmol/L (ref 135–145)
Total Bilirubin: 0.4 mg/dL (ref 0.3–1.2)
Total Protein: 6.8 g/dL (ref 6.5–8.1)

## 2022-12-30 LAB — TSH: TSH: 4.571 u[IU]/mL — ABNORMAL HIGH (ref 0.350–4.500)

## 2022-12-30 MED ORDER — SODIUM CHLORIDE 0.9% FLUSH: 10.0000 mL | INTRAVENOUS | Status: DC | PRN

## 2022-12-30 MED ORDER — SODIUM CHLORIDE 0.9 % IV SOLN
150.0000 mg | Freq: Once | INTRAVENOUS | Status: AC
  Administered 2022-12-30: 150 mg via INTRAVENOUS
  Filled 2022-12-30: qty 150

## 2022-12-30 MED ORDER — SODIUM CHLORIDE 0.9 % IV SOLN
200.0000 mg | Freq: Once | INTRAVENOUS | Status: AC
  Administered 2022-12-30: 200 mg via INTRAVENOUS
  Filled 2022-12-30: qty 200

## 2022-12-30 MED ORDER — SODIUM CHLORIDE 0.9 % IV SOLN
500.0000 mg/m2 | Freq: Once | INTRAVENOUS | Status: AC
  Administered 2022-12-30: 800 mg via INTRAVENOUS
  Filled 2022-12-30: qty 20

## 2022-12-30 MED ORDER — SODIUM CHLORIDE 0.9 % IV SOLN
10.0000 mg | Freq: Once | INTRAVENOUS | Status: AC
  Administered 2022-12-30: 10 mg via INTRAVENOUS
  Filled 2022-12-30: qty 10

## 2022-12-30 MED ORDER — HEPARIN SOD (PORK) LOCK FLUSH 100 UNIT/ML IV SOLN: 500.0000 [IU] | Freq: Once | INTRAVENOUS | Status: DC | PRN

## 2022-12-30 MED ORDER — SODIUM CHLORIDE 0.9 % IV SOLN: Freq: Once | INTRAVENOUS | Status: AC

## 2022-12-30 MED ORDER — SODIUM CHLORIDE 0.9 % IV SOLN
376.5000 mg | Freq: Once | INTRAVENOUS | Status: AC
  Administered 2022-12-30: 400 mg via INTRAVENOUS
  Filled 2022-12-30: qty 40

## 2022-12-30 MED ORDER — PALONOSETRON HCL INJECTION 0.25 MG/5ML
0.2500 mg | Freq: Once | INTRAVENOUS | Status: AC
  Administered 2022-12-30: 0.25 mg via INTRAVENOUS
  Filled 2022-12-30: qty 5

## 2022-12-30 NOTE — Patient Instructions (Addendum)

## 2022-12-30 NOTE — Progress Notes (Signed)
Today is a follow up/1st day of chemotherapy appointment with Patricia [name]. Pt is doing well, eager to start treatment. She will be receiving her chemotherapy education while in the infusion room. Her granddaughter, Herbert Seta, accompanies her today. The patient is able to speak and communicate, however she has a chronic issue with hoarseness which makes it difficult to hear her, so her granddaughter helps with that if she can. I reviewed the chemotherapy regimen and the side effects she may experience, and let her know she'd be receiving much more in-depth detail during her time with the chemotherapy educator. Herbert Seta brought in [facility] paperwork for the pt, and I instructed them to go to the registration desk and they can assist with them with that. I will touch base with the patient later this week to see how her first treatment went.

## 2022-12-30 NOTE — Patient Instructions (Addendum)
[facility]  Discharge Instructions: Thank you for choosing [facility] to provide your oncology and hematology care.   If you have a lab appointment with the Cancer Center, please go directly to the Cancer Center and check in at the registration area.   Wear comfortable clothing and clothing appropriate for easy access to any Portacath or PICC line.   We strive to give you quality time with your provider. You may need to reschedule your appointment if you arrive late (15 or more minutes).  Arriving late affects you and other patients whose appointments are after yours.  Also, if you miss three or more appointments without notifying the office, you may be dismissed from the clinic at the provider's discretion.      For prescription refill requests, have your pharmacy contact our office and allow 72 hours for refills to be completed.    Today you received the following chemotherapy and/or immunotherapy agents: Alimta, Keytruda, Carboplatin.       To help prevent nausea and vomiting after your treatment, we encourage you to take your nausea medication as directed.  BELOW ARE SYMPTOMS THAT SHOULD BE REPORTED IMMEDIATELY: *FEVER GREATER THAN 100.4 F (38 C) OR HIGHER *CHILLS OR SWEATING *NAUSEA AND VOMITING THAT IS NOT CONTROLLED WITH YOUR NAUSEA MEDICATION *UNUSUAL SHORTNESS OF BREATH *UNUSUAL BRUISING OR BLEEDING *URINARY PROBLEMS (pain or burning when urinating, or frequent urination) *BOWEL PROBLEMS (unusual diarrhea, constipation, pain near the anus) TENDERNESS IN MOUTH AND THROAT WITH OR WITHOUT PRESENCE OF ULCERS (sore throat, sores in mouth, or a toothache) UNUSUAL RASH, SWELLING OR PAIN  UNUSUAL VAGINAL DISCHARGE OR ITCHING   Items with * indicate a potential emergency and should be followed up as soon as possible or go to the Emergency Department if any problems should occur.  Please show the CHEMOTHERAPY ALERT CARD or  IMMUNOTHERAPY ALERT CARD at check-in to the Emergency Department and triage nurse.  Should you have questions after your visit or need to cancel or reschedule your appointment, please contact [facility]  Dept: [phone]  and follow the prompts.  Office hours are 8:00 a.m. to 4:30 p.m. Monday - Friday. Please note that voicemails left after 4:00 p.m. may not be returned until the following business day.  We are closed weekends and major holidays. You have access to a nurse at all times for urgent questions. Please call the main number to the clinic Dept: [phone] and follow the prompts.   For any non-urgent questions, you may also contact your provider using MyChart. We now offer e-Visits for anyone 58 and older to request care online for non-urgent symptoms. For details visit [email or web address].   Also download the MyChart app! Go to the app store, search "MyChart", open the app, select [facility], and log in with your MyChart username and password.  Pembrolizumab Injection What is this medication? PEMBROLIZUMAB (PEM broe LIZ ue mab) treats some types of cancer. It works by helping your immune system slow or stop the spread of cancer cells. It is a monoclonal antibody. This medicine may be used for other purposes; ask your health care provider or pharmacist if you have questions. COMMON BRAND NAME(S): Keytruda What should I tell my care team before I take this medication? They need to know if you have any of these conditions: Allogeneic stem cell transplant (uses someone else's stem cells) Autoimmune diseases, such as Crohn disease, ulcerative colitis, lupus History of  chest radiation Nervous system problems, such as Guillain-Barre syndrome, myasthenia gravis Organ transplant An unusual or allergic reaction to pembrolizumab, other medications, foods, dyes, or preservatives Pregnant or trying to get pregnant Breast-feeding How should I use  this medication? This medication is injected into a vein. It is given by your care team in a hospital or clinic setting. A special MedGuide will be given to you before each treatment. Be sure to read this information carefully each time. Talk to your care team about the use of this medication in children. While it may be prescribed for children as young as 6 months for selected conditions, precautions do apply. Overdosage: If you think you have taken too much of this medicine contact a poison control center or emergency room at once. NOTE: This medicine is only for you. Do not share this medicine with others. What if I miss a dose? Keep appointments for follow-up doses. It is important not to miss your dose. Call your care team if you are unable to keep an appointment. What may interact with this medication? Interactions have not been studied. This list may not describe all possible interactions. Give your health care provider a list of all the medicines, herbs, non-prescription drugs, or dietary supplements you use. Also tell them if you smoke, drink alcohol, or use illegal drugs. Some items may interact with your medicine. What should I watch for while using this medication? Your condition will be monitored carefully while you are receiving this medication. You may need blood work while taking this medication. This medication may cause serious skin reactions. They can happen weeks to months after starting the medication. Contact your care team right away if you notice fevers or flu-like symptoms with a rash. The rash may be red or purple and then turn into blisters or peeling of the skin. You may also notice a red rash with swelling of the face, lips, or lymph nodes in your neck or under your arms. Tell your care team right away if you have any change in your eyesight. Talk to your care team if you may be pregnant. Serious birth defects can occur if you take this medication during pregnancy and for  4 months after the last dose. You will need a negative pregnancy test before starting this medication. Contraception is recommended while taking this medication and for 4 months after the last dose. Your care team can help you find the option that works for you. Do not breastfeed while taking this medication and for 4 months after the last dose. What side effects may I notice from receiving this medication? Side effects that you should report to your care team as soon as possible: Allergic reactions--skin rash, itching, hives, swelling of the face, lips, tongue, or throat Dry cough, shortness of breath or trouble breathing Eye pain, redness, irritation, or discharge with blurry or decreased vision Heart muscle inflammation--unusual weakness or fatigue, shortness of breath, chest pain, fast or irregular heartbeat, dizziness, swelling of the ankles, feet, or hands Hormone gland problems--headache, sensitivity to light, unusual weakness or fatigue, dizziness, fast or irregular heartbeat, increased sensitivity to cold or heat, excessive sweating, constipation, hair loss, increased thirst or amount of urine, tremors or shaking, irritability Infusion reactions--chest pain, shortness of breath or trouble breathing, feeling faint or lightheaded Kidney injury (glomerulonephritis)--decrease in the amount of urine, red or dark brown urine, foamy or bubbly urine, swelling of the ankles, hands, or feet Liver injury--right upper belly pain, loss of appetite, nausea, light-colored stool,  dark yellow or brown urine, yellowing skin or eyes, unusual weakness or fatigue Pain, tingling, or numbness in the hands or feet, muscle weakness, change in vision, confusion or trouble speaking, loss of balance or coordination, trouble walking, seizures Rash, fever, and swollen lymph nodes Redness, blistering, peeling, or loosening of the skin, including inside the mouth Sudden or severe stomach pain, bloody diarrhea, fever,  nausea, vomiting Side effects that usually do not require medical attention (report to your care team if they continue or are bothersome): Bone, joint, or muscle pain Diarrhea Fatigue Loss of appetite Nausea Skin rash This list may not describe all possible side effects. Call your doctor for medical advice about side effects. You may report side effects to FDA at 1-800-FDA-1088. Where should I keep my medication? This medication is given in a hospital or clinic. It will not be stored at home. NOTE: This sheet is a summary. It may not cover all possible information. If you have questions about this medicine, talk to your doctor, pharmacist, or health care provider.  2023 Elsevier/Gold Standard (2013-08-16 00:00:00)  Pemetrexed Injection What is this medication? PEMETREXED (PEM e TREX ed) treats some types of cancer. It works by slowing down the growth of cancer cells. This medicine may be used for other purposes; ask your health care provider or pharmacist if you have questions. COMMON BRAND NAME(S): Alimta, PEMFEXY What should I tell my care team before I take this medication? They need to know if you have any of these conditions: Infection, such as chickenpox, cold sores, or herpes Kidney disease Low blood cell levels (white cells, red cells, and platelets) Lung or breathing disease, such as asthma Radiation therapy An unusual or allergic reaction to pemetrexed, other medications, foods, dyes, or preservatives If you or your partner are pregnant or trying to get pregnant Breast-feeding How should I use this medication? This medication is injected into a vein. It is given by your care team in a hospital or clinic setting. Talk to your care team about the use of this medication in children. Special care may be needed. Overdosage: If you think you have taken too much of this medicine contact a poison control center or emergency room at once. NOTE: This medicine is only for you. Do not  share this medicine with others. What if I miss a dose? Keep appointments for follow-up doses. It is important not to miss your dose. Call your care team if you are unable to keep an appointment. What may interact with this medication? Do not take this medication with any of the following: Live virus vaccines This medication may also interact with the following: Ibuprofen This list may not describe all possible interactions. Give your health care provider a list of all the medicines, herbs, non-prescription drugs, or dietary supplements you use. Also tell them if you smoke, drink alcohol, or use illegal drugs. Some items may interact with your medicine. What should I watch for while using this medication? Your condition will be monitored carefully while you are receiving this medication. This medication may make you feel generally unwell. This is not uncommon as chemotherapy can affect healthy cells as well as cancer cells. Report any side effects. Continue your course of treatment even though you feel ill unless your care team tells you to stop. This medication can cause serious side effects. To reduce the risk, your care team may give you other medications to take before receiving this one. Be sure to follow the directions from your care  team. This medication can cause a rash or redness in areas of the body that have previously had radiation therapy. If you have had radiation therapy, tell your care team if you notice a rash in this area. This medication may increase your risk of getting an infection. Call your care team for advice if you get a fever, chills, sore throat, or other symptoms of a cold or flu. Do not treat yourself. Try to avoid being around people who are sick. Be careful brushing or flossing your teeth or using a toothpick because you may get an infection or bleed more easily. If you have any dental work done, tell your dentist you are receiving this medication. Avoid taking  medications that contain aspirin, acetaminophen, ibuprofen, naproxen, or ketoprofen unless instructed by your care team. These medications may hide a fever. Check with your care team if you have severe diarrhea, nausea, and vomiting, or if you sweat a lot. The loss of too much body fluid may make it dangerous for you to take this medication. Talk to your care team if you or your partner wish to become pregnant or think either of you might be pregnant. This medication can cause serious birth defects if taken during pregnancy and for 6 months after the last dose. A negative pregnancy test is required before starting this medication. A reliable form of contraception is recommended while taking this medication and for 6 months after the last dose. Talk to your care team about reliable forms of contraception. Do not father a child while taking this medication and for 3 months after the last dose. Use a condom while having sex during this time period. Do not breastfeed while taking this medication and for 1 week after the last dose. This medication may cause infertility. Talk to your care team if you are concerned about your fertility. What side effects may I notice from receiving this medication? Side effects that you should report to your care team as soon as possible: Allergic reactions--skin rash, itching, hives, swelling of the face, lips, tongue, or throat Dry cough, shortness of breath or trouble breathing Infection--fever, chills, cough, sore throat, wounds that don't heal, pain or trouble when passing urine, general feeling of discomfort or being unwell Kidney injury--decrease in the amount of urine, swelling of the ankles, hands, or feet Low red blood cell level--unusual weakness or fatigue, dizziness, headache, trouble breathing Redness, blistering, peeling, or loosening of the skin, including inside the mouth Unusual bruising or bleeding Side effects that usually do not require medical attention  (report to your care team if they continue or are bothersome): Fatigue Loss of appetite Nausea Vomiting This list may not describe all possible side effects. Call your doctor for medical advice about side effects. You may report side effects to FDA at 1-800-FDA-1088. Where should I keep my medication? This medication is given in a hospital or clinic. It will not be stored at home. NOTE: This sheet is a summary. It may not cover all possible information. If you have questions about this medicine, talk to your doctor, pharmacist, or health care provider.  2023 Elsevier/Gold Standard (2022-04-01 00:00:00)  Carboplatin Injection What is this medication? CARBOPLATIN (KAR boe pla tin) treats some types of cancer. It works by slowing down the growth of cancer cells. This medicine may be used for other purposes; ask your health care provider or pharmacist if you have questions. COMMON BRAND NAME(S): Paraplatin What should I tell my care team before I take this medication?  They need to know if you have any of these conditions: Blood disorders Hearing problems Kidney disease Recent or ongoing radiation therapy An unusual or allergic reaction to carboplatin, cisplatin, other medications, foods, dyes, or preservatives Pregnant or trying to get pregnant Breast-feeding How should I use this medication? This medication is injected into a vein. It is given by your care team in a hospital or clinic setting. Talk to your care team about the use of this medication in children. Special care may be needed. Overdosage: If you think you have taken too much of this medicine contact a poison control center or emergency room at once. NOTE: This medicine is only for you. Do not share this medicine with others. What if I miss a dose? Keep appointments for follow-up doses. It is important not to miss your dose. Call your care team if you are unable to keep an appointment. What may interact with this  medication? Medications for seizures Some antibiotics, such as amikacin, gentamicin, neomycin, streptomycin, tobramycin Vaccines This list may not describe all possible interactions. Give your health care provider a list of all the medicines, herbs, non-prescription drugs, or dietary supplements you use. Also tell them if you smoke, drink alcohol, or use illegal drugs. Some items may interact with your medicine. What should I watch for while using this medication? Your condition will be monitored carefully while you are receiving this medication. You may need blood work while taking this medication. This medication may make you feel generally unwell. This is not uncommon, as chemotherapy can affect healthy cells as well as cancer cells. Report any side effects. Continue your course of treatment even though you feel ill unless your care team tells you to stop. In some cases, you may be given additional medications to help with side effects. Follow all directions for their use. This medication may increase your risk of getting an infection. Call your care team for advice if you get a fever, chills, sore throat, or other symptoms of a cold or flu. Do not treat yourself. Try to avoid being around people who are sick. Avoid taking medications that contain aspirin, acetaminophen, ibuprofen, naproxen, or ketoprofen unless instructed by your care team. These medications may hide a fever. Be careful brushing or flossing your teeth or using a toothpick because you may get an infection or bleed more easily. If you have any dental work done, tell your dentist you are receiving this medication. Talk to your care team if you wish to become pregnant or think you might be pregnant. This medication can cause serious birth defects. Talk to your care team about effective forms of contraception. Do not breast-feed while taking this medication. What side effects may I notice from receiving this medication? Side effects  that you should report to your care team as soon as possible: Allergic reactions--skin rash, itching, hives, swelling of the face, lips, tongue, or throat Infection--fever, chills, cough, sore throat, wounds that don't heal, pain or trouble when passing urine, general feeling of discomfort or being unwell Low red blood cell level--unusual weakness or fatigue, dizziness, headache, trouble breathing Pain, tingling, or numbness in the hands or feet, muscle weakness, change in vision, confusion or trouble speaking, loss of balance or coordination, trouble walking, seizures Unusual bruising or bleeding Side effects that usually do not require medical attention (report to your care team if they continue or are bothersome): Hair loss Nausea Unusual weakness or fatigue Vomiting This list may not describe all possible side effects. Call  your doctor for medical advice about side effects. You may report side effects to FDA at 1-800-FDA-1088. Where should I keep my medication? This medication is given in a hospital or clinic. It will not be stored at home. NOTE: This sheet is a summary. It may not cover all possible information. If you have questions about this medicine, talk to your doctor, pharmacist, or health care provider.  2023 Elsevier/Gold Standard (2022-03-11 00:00:00)

## 2022-12-31 ENCOUNTER — Other Ambulatory Visit: Payer: Self-pay

## 2022-12-31 ENCOUNTER — Telehealth: Payer: Self-pay | Admitting: *Deleted

## 2022-12-31 LAB — T4: T4, Total: 8.4 ug/dL (ref 4.5–12.0)

## 2022-12-31 LAB — GUARDANT 360

## 2022-12-31 NOTE — Telephone Encounter (Signed)
Called pt to see how she did with her treatment.  She reports having a sinus h/a, burning all w/e if I understood her correctly.  She has taken Tylenol & using nasal spray without much help.  She denies any other problems & says she is eating & drinking fluids well.  She knows how to reach [name] & knows her next appts.  Message routed to MD/Pod for suggestions.

## 2022-12-31 NOTE — Telephone Encounter (Signed)
-----  Message from Rae Roam, RN sent at 12/30/2022 12:07 PM EST ----- Regarding: Dr Arbutus Ped pt, first time Keytruda/Alimta/Carboplatin Dr Arbutus Ped pt came in 12/30/22 for first time Keytruda/Alimta/Carboplatin. Tolerated infusions well. Needs call back.

## 2023-01-01 ENCOUNTER — Telehealth: Payer: Self-pay | Admitting: Medical Oncology

## 2023-01-01 NOTE — Telephone Encounter (Signed)
"  Sinus headache since treatment". She states her head hurts on temples. I instructed her to press on her facial and forehead bones and she denied pain with pressure.   She received Aloxi and Emend premeds on 01/22 She has been taking Tylenol every 6 hours. Last dose at 7 am . It does not  help the pain. She said the pain is less intense compared to the weekend.   Per Dr. Julien Nordmann , I instructed pt that she may take Aleve 220 mg x 1 and to call back and let us know if that helped.

## 2023-01-02 ENCOUNTER — Other Ambulatory Visit: Payer: Self-pay | Admitting: Nurse Practitioner

## 2023-01-02 ENCOUNTER — Telehealth: Payer: Self-pay

## 2023-01-02 ENCOUNTER — Other Ambulatory Visit: Payer: Self-pay

## 2023-01-02 DIAGNOSIS — C3492 Malignant neoplasm of unspecified part of left bronchus or lung: Secondary | ICD-10-CM

## 2023-01-02 NOTE — Telephone Encounter (Signed)
RN spoke with patient's granddaughter, [name], regarding coming in to [facility] for her headache. Per patient's granddaughter, she will wait for [place] to wake up to verify an appointment time for tomorrow. RN called [name] back to see if patient had a time she could come in tomorrow. No answer, voicemail left for granddaughter to follow up for appointment to be scheduled.

## 2023-01-02 NOTE — Telephone Encounter (Signed)
RN spoke with granddaughter regarding patient's ongoing headaches and [facility] visit. Patient's granddaughter verified date and time of [facility] appointment. Labs scheduled for 1130 and [facility] scheduled for 12 tomorrow.

## 2023-01-02 NOTE — Progress Notes (Signed)
Lab orders entered for [facility] visit.

## 2023-01-03 ENCOUNTER — Other Ambulatory Visit: Payer: Self-pay

## 2023-01-03 ENCOUNTER — Encounter: Payer: Self-pay | Admitting: Internal Medicine

## 2023-01-03 ENCOUNTER — Other Ambulatory Visit (HOSPITAL_COMMUNITY): Payer: Self-pay

## 2023-01-03 ENCOUNTER — Inpatient Hospital Stay (HOSPITAL_BASED_OUTPATIENT_CLINIC_OR_DEPARTMENT_OTHER): Payer: Medicare HMO | Admitting: Physician Assistant

## 2023-01-03 ENCOUNTER — Inpatient Hospital Stay: Payer: Medicare HMO

## 2023-01-03 VITALS — BP 129/72 | HR 73 | Temp 98.2°F | Resp 16 | Wt 139.7 lb

## 2023-01-03 DIAGNOSIS — J3489 Other specified disorders of nose and nasal sinuses: Secondary | ICD-10-CM | POA: Diagnosis not present

## 2023-01-03 DIAGNOSIS — Z90710 Acquired absence of both cervix and uterus: Secondary | ICD-10-CM | POA: Diagnosis not present

## 2023-01-03 DIAGNOSIS — K1230 Oral mucositis (ulcerative), unspecified: Secondary | ICD-10-CM

## 2023-01-03 DIAGNOSIS — Z5111 Encounter for antineoplastic chemotherapy: Secondary | ICD-10-CM | POA: Diagnosis not present

## 2023-01-03 DIAGNOSIS — C3492 Malignant neoplasm of unspecified part of left bronchus or lung: Secondary | ICD-10-CM

## 2023-01-03 DIAGNOSIS — Z87891 Personal history of nicotine dependence: Secondary | ICD-10-CM | POA: Diagnosis not present

## 2023-01-03 DIAGNOSIS — R519 Headache, unspecified: Secondary | ICD-10-CM

## 2023-01-03 DIAGNOSIS — I6782 Cerebral ischemia: Secondary | ICD-10-CM | POA: Diagnosis not present

## 2023-01-03 DIAGNOSIS — C3412 Malignant neoplasm of upper lobe, left bronchus or lung: Secondary | ICD-10-CM | POA: Diagnosis not present

## 2023-01-03 DIAGNOSIS — C778 Secondary and unspecified malignant neoplasm of lymph nodes of multiple regions: Secondary | ICD-10-CM | POA: Diagnosis not present

## 2023-01-03 DIAGNOSIS — R5383 Other fatigue: Secondary | ICD-10-CM | POA: Diagnosis not present

## 2023-01-03 DIAGNOSIS — D72829 Elevated white blood cell count, unspecified: Secondary | ICD-10-CM | POA: Diagnosis not present

## 2023-01-03 DIAGNOSIS — Z5112 Encounter for antineoplastic immunotherapy: Secondary | ICD-10-CM | POA: Diagnosis not present

## 2023-01-03 DIAGNOSIS — E538 Deficiency of other specified B group vitamins: Secondary | ICD-10-CM | POA: Diagnosis not present

## 2023-01-03 DIAGNOSIS — Z79899 Other long term (current) drug therapy: Secondary | ICD-10-CM | POA: Diagnosis not present

## 2023-01-03 LAB — CMP (CANCER CENTER ONLY)
ALT: 23 U/L (ref 0–44)
AST: 16 U/L (ref 15–41)
Albumin: 3.9 g/dL (ref 3.5–5.0)
Alkaline Phosphatase: 95 U/L (ref 38–126)
Anion gap: 6 (ref 5–15)
BUN: 26 mg/dL — ABNORMAL HIGH (ref 8–23)
CO2: 29 mmol/L (ref 22–32)
Calcium: 9.8 mg/dL (ref 8.9–10.3)
Chloride: 102 mmol/L (ref 98–111)
Creatinine: 0.76 mg/dL (ref 0.44–1.00)
GFR, Estimated: >60 mL/min (ref >60)
Glucose, Bld: 94 mg/dL (ref 70–99)
Potassium: 3.8 mmol/L (ref 3.5–5.1)
Sodium: 137 mmol/L (ref 135–145)
Total Bilirubin: 0.6 mg/dL (ref 0.3–1.2)
Total Protein: 6.7 g/dL (ref 6.5–8.1)

## 2023-01-03 LAB — CBC WITH DIFFERENTIAL (CANCER CENTER ONLY)
Abs Immature Granulocytes: 0.04 10*3/uL (ref 0.00–0.07)
Basophils Absolute: 0.1 10*3/uL (ref 0.0–0.1)
Basophils Relative: 1 %
Eosinophils Absolute: 1.5 10*3/uL — ABNORMAL HIGH (ref 0.0–0.5)
Eosinophils Relative: 11 %
HCT: 39.7 % (ref 36.0–46.0)
Hemoglobin: 13.2 g/dL (ref 12.0–15.0)
Immature Granulocytes: 0 %
Lymphocytes Relative: 15 %
Lymphs Abs: 2 10*3/uL (ref 0.7–4.0)
MCH: 28.6 pg (ref 26.0–34.0)
MCHC: 33.2 g/dL (ref 30.0–36.0)
MCV: 85.9 fL (ref 80.0–100.0)
Monocytes Absolute: 0.2 10*3/uL (ref 0.1–1.0)
Monocytes Relative: 1 %
Neutro Abs: 9.4 10*3/uL — ABNORMAL HIGH (ref 1.7–7.7)
Neutrophils Relative %: 72 %
Platelet Count: 223 10*3/uL (ref 150–400)
RBC: 4.62 MIL/uL (ref 3.87–5.11)
RDW: 12.3 % (ref 11.5–15.5)
WBC Count: 13.2 10*3/uL — ABNORMAL HIGH (ref 4.0–10.5)
nRBC: 0 % (ref 0.0–0.2)

## 2023-01-03 MED ORDER — AMOXICILLIN-POT CLAVULANATE 875-125 MG PO TABS
1.0000 | ORAL_TABLET | Freq: Two times a day (BID) | ORAL | 0 refills | Status: AC
  Filled 2023-01-03: qty 14, 7d supply, fill #0

## 2023-01-03 MED ORDER — NYSTATIN 100000 UNIT/ML MT SUSP
5.0000 mL | Freq: Three times a day (TID) | OROMUCOSAL | 0 refills | Status: DC | PRN
  Filled 2023-01-03: qty 180, 12d supply, fill #0

## 2023-01-03 NOTE — Progress Notes (Signed)
Symptom Management Consult note [facility]    Patient Care Team: [name], MD as PCP - General (Family Medicine)    Name of the patient: Patricia [name]  [id number]  1946-02-19   Date of visit: 01/03/2023   Chief Complaint/Reason for visit: headache   Current Therapy: Systemic chemotherapy with carboplatin for AUC of 5, Alimta 500 Mg/M2 and Keytruda 200 Mg IV every 3 weeks   Last treatment:  Day 1   Cycle 1 on 12/30/22   ASSESSMENT & PLAN: Patient is a 77 y.o. female  with oncologic history of Stage IV (T1c, N3, M1 C) non-small cell lung cancer, adenocarcinoma  followed by Dr. Julien Nordmann.  I have viewed most recent oncology note and lab work.    #Stage IV (T1c, N3, M1 C) non-small cell lung cancer, adenocarcinoma  - Recently started chemo - Next appointment with oncologist is 01/23/23   #Mucositis -Grade 1. Prescription sent for magic mouth wash.  #Headache -Chart review shows she had MR brain to complete intitial work up on 11/29/22. MR showed no evidence of intracranial metastatic disease.  Does show sequelae of chronic small vessel ischemia and mild cerebral atrophy. -On exam today patient is nontoxic-appearing.  Afebrile, normotensive.  She has a normal neuro exam.  She does have frontal sinus tenderness.  No signs of otitis media or externa.  Does not appear dehydrated. -CBC with leukocytosis 13.2, similar to labs over the last 2 weeks.  CMP is overall unremarkable. -Discussed further workup and management of headache with patient.  Engaged in shared decision making.  With reassuring exam and recent head imaging brain metastases less likely at this time although patient is aware they cannot be r/o with imaging.  Patient does not wish to have any scans and would rather try conservative treatment first.  I will treat her for developing sinus infection with Augmentin.  Also encouraged her to try an antihistamine to help with headache as well as Tylenol and  stay well hydrated. -Discussed red flag symptoms that would warrant emergent evaluation.  Patient and granddaughter are in agreement with plan.   Heme/Onc History: Oncology History  Adenocarcinoma of left lung, stage 4 (HCC)  12/19/2022 Initial Diagnosis   Adenocarcinoma of left lung, stage 4 ([place])   12/19/2022 Cancer Staging   Staging form: Lung, AJCC 8th Edition - Clinical: Stage IVB (cT1c, cN3, cM1c) - Signed by Curt Bears, MD on 12/19/2022   12/30/2022 -  Chemotherapy   Patient is on Treatment Plan : LUNG Carboplatin (5) + Pemetrexed (500) + Pembrolizumab (200) D1 q21d Induction x 4 cycles / Maintenance Pemetrexed (500) + Pembrolizumab (200) D1 q21d         Interval history-: Patricia [name] is a 77 y.o. female with oncologic history as above presenting to [facility] today with chief complaint of headache x 3 days.  She is accompanied by her granddaughter who provides additional history.  She describes the headache as a constant pressure sensation.  The headache is located mostly in her forehead and is worse when leaning forward. She had rhinorrhea this morning. Pain is currently 5/10 in severity. She has tried taking tylenol, Excedrin and aleve at home with minimal results. She said the aleve helped the most although she was advised to only take it sparingly from oncologist. She denies any associated visual changes, neck pain, dizziness, numbness, tingling or weakness. She has history of headaches although has not had one in many years. Denies any fall  or head injury.  She is also complaining of mouth pain. She noticed some sores on her cheek yesterday. She is unsure if she just chewed her cheek while sleeping. She is tolerating eating and drinking without difficulty. Pain is mild and she describes it as aching. She believes she is well hydrated. No dental pain, has implants.      ROS  All other systems are reviewed and are negative for acute change except as noted in the  HPI.    Allergies  Allergen Reactions   Codeine Nausea Only     Past Medical History:  Diagnosis Date   Depression    GERD (gastroesophageal reflux disease)    Hypercholesterolemia    IFG (impaired fasting glucose)    Osteopenia    PONV (postoperative nausea and vomiting)      Past Surgical History:  Procedure Laterality Date   ABDOMINAL HYSTERECTOMY     CATARACT EXTRACTION W/PHACO  09/12/2011   Procedure: CATARACT EXTRACTION PHACO AND INTRAOCULAR LENS PLACEMENT ([place]);  Surgeon: Tonny Branch;  Location: [facility];  Service: Ophthalmology;  Laterality: Left;  CDE: 8.91   CATARACT EXTRACTION W/PHACO  11/18/2011   Procedure: CATARACT EXTRACTION PHACO AND INTRAOCULAR LENS PLACEMENT (IOC);  Surgeon: Tonny Branch;  Location: [facility];  Service: Ophthalmology;  Laterality: Right;  CDE:10.26   COLONOSCOPY  2007   Dr. Gala Romney: internal hemorrhoids, single anal papilla poor prep.    COLONOSCOPY N/A 03/27/2017   Rourk: Normal exam.   ECTOPIC PREGNANCY SURGERY     removal of right tube   ESOPHAGOGASTRODUODENOSCOPY (EGD) WITH PROPOFOL N/A 09/10/2021   Procedure: ESOPHAGOGASTRODUODENOSCOPY (EGD) WITH PROPOFOL;  Surgeon: Daneil Dolin, MD;  Location: [facility];  Service: Endoscopy;  Laterality: N/A;  8:15AM   MALONEY DILATION N/A 09/10/2021   Procedure: Venia Minks DILATION;  Surgeon: Daneil Dolin, MD;  Location: [facility];  Service: Endoscopy;  Laterality: N/A;   OVARIAN CYST REMOVAL     right   RECTOCELE REPAIR N/A 01/18/2014   Procedure: POSTERIOR REPAIR (RECTOCELE);  Surgeon: Jonnie Kind, MD;  Location: [facility];  Service: Gynecology;  Laterality: N/A;   VAGINAL HYSTERECTOMY N/A 01/18/2014   Procedure: HYSTERECTOMY VAGINAL;  Surgeon: Jonnie Kind, MD;  Location: [facility];  Service: Gynecology;  Laterality: N/A;    Social History   Socioeconomic History   Marital status: Married    Spouse name: Not on file   Number of children: Not on file   Years of education: Not on file    Highest education level: Not on file  Occupational History   Not on file  Tobacco Use   Smoking status: Former    Packs/day: 1.00    Years: 20.00    Total pack years: 20.00    Types: Cigarettes    Quit date: 09/10/1984    Years since quitting: 38.3   Smokeless tobacco: Never  Vaping Use   Vaping Use: Never used  Substance and Sexual Activity   Alcohol use: No   Drug use: No   Sexual activity: Not Currently    Birth control/protection: Post-menopausal  Other Topics Concern   Not on file  Social History Narrative   Not on file   Social Determinants of Health   Financial Resource Strain: Not on file  Food Insecurity: Not on file  Transportation Needs: Not on file  Physical Activity: Not on file  Stress: Not on file  Social Connections: Not on file  Intimate Partner Violence: Not on file  Family History  Problem Relation Age of Onset   Heart attack Father    Osteoporosis Mother    Heart attack Brother 54       MI   Breast cancer Maternal Aunt    Breast cancer Paternal Aunt    Cancer Paternal Aunt        ovarian cancer   Anesthesia problems Neg Hx    Hypotension Neg Hx    Malignant hyperthermia Neg Hx    Pseudochol deficiency Neg Hx    Colon cancer Neg Hx      Current Outpatient Medications:    amoxicillin-clavulanate (AUGMENTIN) 875-125 MG tablet, Take 1 tablet by mouth 2 (two) times daily for 7 days., Disp: 14 tablet, Rfl: 0   magic mouthwash (nystatin, lidocaine, diphenhydrAMINE, alum & mag hydroxide) suspension, Swish and spit 5 mLs 3 (three) times daily as needed for mouth pain., Disp: 180 mL, Rfl: 0   ALPRAZolam (XANAX) 0.5 MG tablet, TAKE (1) TABLET BY MOUTH AT BEDTIME AS NEEDED FOR SLEEP., Disp: 30 tablet, Rfl: 2   b complex vitamins capsule, Take 1 capsule by mouth daily., Disp: , Rfl:    Calcium Carb-Cholecalciferol (CALCIUM 600 + D PO), Take 1 tablet by mouth at bedtime. , Disp: , Rfl:    Cholecalciferol (VITAMIN D3) 125 MCG (5000 UT) CAPS, Take 5,000  Units by mouth daily., Disp: , Rfl:    COLLAGEN PO, Take 2 each by mouth daily. Chewables, Disp: , Rfl:    folic acid (FOLVITE) 1 MG tablet, Take 1 tablet (1 mg total) by mouth daily., Disp: 30 tablet, Rfl: 4   lansoprazole (PREVACID) 30 MG capsule, Take one po qd for acid reflux, Disp: 90 capsule, Rfl: 1   meclizine (ANTIVERT) 25 MG tablet, Take one tablet po TID prn dizziness, Disp: 30 tablet, Rfl: 0   Multiple Vitamin (MULTIVITAMIN WITH MINERALS) TABS tablet, Take 1 tablet by mouth daily., Disp: , Rfl:    naproxen sodium (ALEVE) 220 MG tablet, Take 220 mg by mouth daily as needed (pain)., Disp: , Rfl:    PARoxetine (PAXIL) 40 MG tablet, TAKE 1 TABLET BY MOUTH EVERY DAY IN THE MORNING, Disp: 90 tablet, Rfl: 2   prochlorperazine (COMPAZINE) 10 MG tablet, Take 1 tablet (10 mg total) by mouth every 6 (six) hours as needed for nausea or vomiting., Disp: 30 tablet, Rfl: 0   rosuvastatin (CRESTOR) 20 MG tablet, Take 1 tablet (20 mg total) by mouth daily., Disp: 100 tablet, Rfl: 1   valsartan-hydrochlorothiazide (DIOVAN-HCT) 160-25 MG tablet, TAKE 1 TABLET BY MOUTH EVERY DAY, Disp: 90 tablet, Rfl: 1  PHYSICAL EXAM: ECOG FS:1 - Symptomatic but completely ambulatory    Vitals:   01/03/23 1208  BP: 129/72  Pulse: 73  Resp: 16  Temp: 98.2 F (36.8 C)  TempSrc: Oral  SpO2: 96%  Weight: 139 lb 11.2 oz (63.4 kg)   Physical Exam Vitals and nursing note reviewed.  Constitutional:      Appearance: She is well-developed. She is not ill-appearing or toxic-appearing.  HENT:     Head: Normocephalic.     Comments: No temporal tenderness.      Right Ear: Tympanic membrane, ear canal and external ear normal.     Left Ear: Tympanic membrane, ear canal and external ear normal.     Nose: Rhinorrhea present.     Right Sinus: Frontal sinus tenderness present.     Left Sinus: Frontal sinus tenderness present.     Mouth/Throat:     Mouth:  Mucous membranes are moist.     Comments: Sores with erythema to  inner bottom lip and left buccal mucosa.  Eyes:     Extraocular Movements: Extraocular movements intact.     Conjunctiva/sclera: Conjunctivae normal.     Pupils: Pupils are equal, round, and reactive to light.     Comments: No nystagmus  Neck:     Vascular: No JVD.  Cardiovascular:     Rate and Rhythm: Normal rate and regular rhythm.     Pulses: Normal pulses.     Heart sounds: Normal heart sounds.  Pulmonary:     Effort: Pulmonary effort is normal.     Breath sounds: Normal breath sounds.  Abdominal:     General: There is no distension.  Musculoskeletal:     Cervical back: Normal range of motion.  Skin:    General: Skin is warm and dry.     Findings: No rash.  Neurological:     Mental Status: She is oriented to person, place, and time.     Comments: Speech is clear and goal oriented, follows commands CN III-XII intact, no facial droop Normal strength in upper and lower extremities bilaterally including dorsiflexion and plantar flexion, strong and equal grip strength Sensation normal to light and sharp touch Moves extremities without ataxia, coordination intact Normal finger to nose and rapid alternating movements Normal gait and balance  Psychiatric:        Mood and Affect: Mood is anxious.        LABORATORY DATA: I have reviewed the data as listed    Latest Ref Rng & Units 01/03/2023   11:42 AM 12/30/2022    8:57 AM 12/16/2022   12:21 PM  CBC  WBC 4.0 - 10.5 K/uL 13.2  12.6  12.4   Hemoglobin 12.0 - 15.0 g/dL 13.2  13.9  13.4   Hematocrit 36.0 - 46.0 % 39.7  41.9  41.8   Platelets 150 - 400 K/uL 223  313  292         Latest Ref Rng & Units 01/03/2023   11:42 AM 12/30/2022    8:57 AM 12/16/2022   12:21 PM  CMP  Glucose 70 - 99 mg/dL 94  93  102   BUN 8 - 23 mg/dL 26  25  16    Creatinine 0.44 - 1.00 mg/dL 0.76  0.95  0.74   Sodium 135 - 145 mmol/L 137  139  139   Potassium 3.5 - 5.1 mmol/L 3.8  3.7  3.6   Chloride 98 - 111 mmol/L 102  104  103   CO2 22 - 32  mmol/L 29  26  25    Calcium 8.9 - 10.3 mg/dL 9.8  10.4  9.7   Total Protein 6.5 - 8.1 g/dL 6.7  6.8    Total Bilirubin 0.3 - 1.2 mg/dL 0.6  0.4    Alkaline Phos 38 - 126 U/L 95  100    AST 15 - 41 U/L 16  15    ALT 0 - 44 U/L 23  20         RADIOGRAPHIC STUDIES (from last 24 hours if applicable) I have personally reviewed the radiological images as listed and agreed with the findings in the report. No results found.      Visit Diagnosis: 1. Acute nonintractable headache, unspecified headache type   2. Adenocarcinoma of left lung, stage 4 ([place])   3. Mucositis      No orders of the defined types  were placed in this encounter.   All questions were answered. The patient knows to call the clinic with any problems, questions or concerns. No barriers to learning was detected.  I have spent a total of 30 minutes minutes of face-to-face and non-face-to-face time, preparing to see the patient, obtaining and/or reviewing separately obtained history, performing a medically appropriate examination, counseling and educating the patient, ordering tests, documenting clinical information in the electronic health record, and care coordination (communications with other health care professionals or caregivers).    Thank you for allowing me to participate in the care of this patient.    Barrie Folk, PA-C Department of Hematology/Oncology [facility] at [facility] Phone: [phone]  [phone]    01/03/2023 8:58 PM

## 2023-01-10 ENCOUNTER — Other Ambulatory Visit: Payer: Self-pay

## 2023-01-10 ENCOUNTER — Inpatient Hospital Stay (HOSPITAL_COMMUNITY)
Admission: EM | Admit: 2023-01-10 | Discharge: 2023-01-17 | DRG: 065 | Disposition: A | Discharge status: Rehabilitation Facility | Payer: Medicare HMO | Attending: Internal Medicine | Admitting: Internal Medicine

## 2023-01-10 ENCOUNTER — Telehealth: Payer: Self-pay

## 2023-01-10 ENCOUNTER — Emergency Department (HOSPITAL_COMMUNITY): Payer: Medicare HMO

## 2023-01-10 ENCOUNTER — Encounter (HOSPITAL_COMMUNITY): Payer: Self-pay

## 2023-01-10 DIAGNOSIS — C3492 Malignant neoplasm of unspecified part of left bronchus or lung: Secondary | ICD-10-CM | POA: Diagnosis present

## 2023-01-10 DIAGNOSIS — R1310 Dysphagia, unspecified: Secondary | ICD-10-CM | POA: Diagnosis present

## 2023-01-10 DIAGNOSIS — M79603 Pain in arm, unspecified: Secondary | ICD-10-CM | POA: Diagnosis not present

## 2023-01-10 DIAGNOSIS — F419 Anxiety disorder, unspecified: Secondary | ICD-10-CM | POA: Diagnosis present

## 2023-01-10 DIAGNOSIS — Z87891 Personal history of nicotine dependence: Secondary | ICD-10-CM

## 2023-01-10 DIAGNOSIS — E785 Hyperlipidemia, unspecified: Secondary | ICD-10-CM | POA: Diagnosis present

## 2023-01-10 DIAGNOSIS — I10 Essential (primary) hypertension: Secondary | ICD-10-CM | POA: Diagnosis present

## 2023-01-10 DIAGNOSIS — Z8262 Family history of osteoporosis: Secondary | ICD-10-CM

## 2023-01-10 DIAGNOSIS — Z28310 Unvaccinated for covid-19: Secondary | ICD-10-CM

## 2023-01-10 DIAGNOSIS — Z885 Allergy status to narcotic agent status: Secondary | ICD-10-CM

## 2023-01-10 DIAGNOSIS — C3412 Malignant neoplasm of upper lobe, left bronchus or lung: Secondary | ICD-10-CM | POA: Diagnosis present

## 2023-01-10 DIAGNOSIS — E7800 Pure hypercholesterolemia, unspecified: Secondary | ICD-10-CM | POA: Diagnosis present

## 2023-01-10 DIAGNOSIS — C781 Secondary malignant neoplasm of mediastinum: Secondary | ICD-10-CM | POA: Diagnosis present

## 2023-01-10 DIAGNOSIS — R29701 NIHSS score 1: Secondary | ICD-10-CM | POA: Diagnosis present

## 2023-01-10 DIAGNOSIS — G8314 Monoplegia of lower limb affecting left nondominant side: Secondary | ICD-10-CM | POA: Diagnosis present

## 2023-01-10 DIAGNOSIS — Z66 Do not resuscitate: Secondary | ICD-10-CM | POA: Diagnosis present

## 2023-01-10 DIAGNOSIS — I639 Cerebral infarction, unspecified: Secondary | ICD-10-CM | POA: Diagnosis not present

## 2023-01-10 DIAGNOSIS — R29898 Other symptoms and signs involving the musculoskeletal system: Secondary | ICD-10-CM | POA: Diagnosis present

## 2023-01-10 DIAGNOSIS — Z79899 Other long term (current) drug therapy: Secondary | ICD-10-CM

## 2023-01-10 DIAGNOSIS — Z803 Family history of malignant neoplasm of breast: Secondary | ICD-10-CM

## 2023-01-10 DIAGNOSIS — Z743 Need for continuous supervision: Secondary | ICD-10-CM | POA: Diagnosis not present

## 2023-01-10 DIAGNOSIS — R490 Dysphonia: Secondary | ICD-10-CM | POA: Diagnosis present

## 2023-01-10 DIAGNOSIS — Z2839 Other underimmunization status: Secondary | ICD-10-CM

## 2023-01-10 DIAGNOSIS — I63443 Cerebral infarction due to embolism of bilateral cerebellar arteries: Secondary | ICD-10-CM | POA: Diagnosis not present

## 2023-01-10 DIAGNOSIS — T451X5A Adverse effect of antineoplastic and immunosuppressive drugs, initial encounter: Secondary | ICD-10-CM | POA: Diagnosis present

## 2023-01-10 DIAGNOSIS — M6281 Muscle weakness (generalized): Secondary | ICD-10-CM | POA: Diagnosis not present

## 2023-01-10 DIAGNOSIS — E782 Mixed hyperlipidemia: Secondary | ICD-10-CM | POA: Diagnosis present

## 2023-01-10 DIAGNOSIS — R531 Weakness: Secondary | ICD-10-CM | POA: Diagnosis not present

## 2023-01-10 DIAGNOSIS — Z8041 Family history of malignant neoplasm of ovary: Secondary | ICD-10-CM

## 2023-01-10 DIAGNOSIS — C770 Secondary and unspecified malignant neoplasm of lymph nodes of head, face and neck: Secondary | ICD-10-CM | POA: Diagnosis present

## 2023-01-10 DIAGNOSIS — F32A Depression, unspecified: Secondary | ICD-10-CM | POA: Diagnosis present

## 2023-01-10 DIAGNOSIS — K219 Gastro-esophageal reflux disease without esophagitis: Secondary | ICD-10-CM | POA: Diagnosis present

## 2023-01-10 DIAGNOSIS — Z8249 Family history of ischemic heart disease and other diseases of the circulatory system: Secondary | ICD-10-CM

## 2023-01-10 LAB — COMPREHENSIVE METABOLIC PANEL
ALT: 16 U/L (ref 0–44)
AST: 19 U/L (ref 15–41)
Albumin: 3.4 g/dL — ABNORMAL LOW (ref 3.5–5.0)
Alkaline Phosphatase: 76 U/L (ref 38–126)
Anion gap: 9 (ref 5–15)
BUN: 12 mg/dL (ref 8–23)
CO2: 27 mmol/L (ref 22–32)
Calcium: 9.1 mg/dL (ref 8.9–10.3)
Chloride: 103 mmol/L (ref 98–111)
Creatinine, Ser: 0.71 mg/dL (ref 0.44–1.00)
GFR, Estimated: >60 mL/min (ref >60)
Glucose, Bld: 95 mg/dL (ref 70–99)
Potassium: 3.6 mmol/L (ref 3.5–5.1)
Sodium: 139 mmol/L (ref 135–145)
Total Bilirubin: 0.7 mg/dL (ref 0.3–1.2)
Total Protein: 6.3 g/dL — ABNORMAL LOW (ref 6.5–8.1)

## 2023-01-10 LAB — CBC
HCT: 32.4 % — ABNORMAL LOW (ref 36.0–46.0)
Hemoglobin: 10.5 g/dL — ABNORMAL LOW (ref 12.0–15.0)
MCH: 28.3 pg (ref 26.0–34.0)
MCHC: 32.4 g/dL (ref 30.0–36.0)
MCV: 87.3 fL (ref 80.0–100.0)
Platelets: 76 10*3/uL — ABNORMAL LOW (ref 150–400)
RBC: 3.71 MIL/uL — ABNORMAL LOW (ref 3.87–5.11)
RDW: 11.9 % (ref 11.5–15.5)
WBC: 5 10*3/uL (ref 4.0–10.5)
nRBC: 0 % (ref 0.0–0.2)

## 2023-01-10 LAB — DIFFERENTIAL
Abs Immature Granulocytes: 0.01 10*3/uL (ref 0.00–0.07)
Basophils Absolute: 0.1 10*3/uL (ref 0.0–0.1)
Basophils Relative: 1 %
Eosinophils Absolute: 0.8 10*3/uL — ABNORMAL HIGH (ref 0.0–0.5)
Eosinophils Relative: 16 %
Immature Granulocytes: 0 %
Lymphocytes Relative: 39 %
Lymphs Abs: 1.9 10*3/uL (ref 0.7–4.0)
Monocytes Absolute: 0.6 10*3/uL (ref 0.1–1.0)
Monocytes Relative: 11 %
Neutro Abs: 1.6 10*3/uL — ABNORMAL LOW (ref 1.7–7.7)
Neutrophils Relative %: 33 %

## 2023-01-10 LAB — URINALYSIS, ROUTINE W REFLEX MICROSCOPIC
Bilirubin Urine: NEGATIVE
Glucose, UA: NEGATIVE mg/dL
Hgb urine dipstick: NEGATIVE
Ketones, ur: NEGATIVE mg/dL
Leukocytes,Ua: NEGATIVE
Nitrite: NEGATIVE
Protein, ur: NEGATIVE mg/dL
Specific Gravity, Urine: 1.012 (ref 1.005–1.030)
pH: 7 (ref 5.0–8.0)

## 2023-01-10 LAB — ETHANOL: Alcohol, Ethyl (B): <10 mg/dL (ref <10)

## 2023-01-10 LAB — RAPID URINE DRUG SCREEN, HOSP PERFORMED
Amphetamines: NOT DETECTED
Barbiturates: NOT DETECTED
Benzodiazepines: POSITIVE — AB
Cocaine: NOT DETECTED
Opiates: NOT DETECTED
Tetrahydrocannabinol: NOT DETECTED

## 2023-01-10 LAB — PROTIME-INR
INR: 1.2 (ref 0.8–1.2)
Prothrombin Time: 15.3 seconds — ABNORMAL HIGH (ref 11.4–15.2)

## 2023-01-10 LAB — APTT: aPTT: 29 seconds (ref 24–36)

## 2023-01-10 LAB — CBG MONITORING, ED: Glucose-Capillary: 106 mg/dL — ABNORMAL HIGH (ref 70–99)

## 2023-01-10 MED ORDER — ASPIRIN 81 MG PO CHEW
81.0000 mg | CHEWABLE_TABLET | Freq: Once | ORAL | Status: AC
  Administered 2023-01-11: 81 mg via ORAL
  Filled 2023-01-10: qty 1

## 2023-01-10 NOTE — ED Provider Notes (Addendum)
[facility] Provider Note   CSN: [id number] Arrival date & time: 01/10/23  1718     History  Chief Complaint  Patient presents with   Weakness    Patricia [name] is a 77 y.o. female.  Patient brought in by EMS.  Patient is followed by [facility].  Patient has stage IV non-small cell lung cancer adenocarcinoma followed by Dr. Earlie Server.  Patient's current therapy includes carboplatin, Alimta and Keytruda.  Last treatment was January 22.  Patient also had MRI done of the brain on December 22 without any acute findings.  Patient brought in today for weakness to her left lower extremity.  Patient last known normal at 9:00 in the morning yesterday when she laid down on the couch.  She was there until 2230 yesterday when she got up to go to the bathroom and had a fall.  She did hit her head on the commode.  Denies any leg pain denies any headache.  States that the left leg feels heavy talked about some left arm movement being painful since February 1.  But no signs of any trauma or injury and moves it fine.  Blood sugar was 115.  Temp was 98.9 blood pressure 115/75 heart rate was 69 respirations 18 oxygen sats 97%.  Patient has a very soft hoarse voice due to metastasis in the neck area.       Home Medications Prior to Admission medications   Medication Sig Start Date End Date Taking? Authorizing Provider  amoxicillin-clavulanate (AUGMENTIN) 875-125 MG tablet Take 1 tablet by mouth 2 (two) times daily for 7 days. 01/03/23 01/10/23 Yes Walisiewicz, Kaitlyn E, PA-C  Cholecalciferol (VITAMIN D3) 125 MCG (5000 UT) CAPS Take 5,000 Units by mouth daily.   Yes [provider]  folic acid (FOLVITE) 1 MG tablet Take 1 tablet (1 mg total) by mouth daily. 12/19/22  Yes Curt Bears, MD  lansoprazole (PREVACID) 30 MG capsule Take one po qd for acid reflux 08/23/22  Yes Nilda Simmer, NP  meclizine (ANTIVERT) 25 MG tablet Take one tablet po  TID prn dizziness 03/01/22  Yes Nilda Simmer, NP  Multiple Vitamin (MULTIVITAMIN WITH MINERALS) TABS tablet Take 1 tablet by mouth daily.   Yes [provider]  naproxen sodium (ALEVE) 220 MG tablet Take 220 mg by mouth daily as needed (pain).   Yes [provider]  PARoxetine (PAXIL) 40 MG tablet TAKE 1 TABLET BY MOUTH EVERY DAY IN THE MORNING Patient taking differently: Take 40 mg by mouth in the morning. 10/11/22  Yes Nilda Simmer, NP  prochlorperazine (COMPAZINE) 10 MG tablet Take 1 tablet (10 mg total) by mouth every 6 (six) hours as needed for nausea or vomiting. 12/19/22  Yes Curt Bears, MD  rosuvastatin (CRESTOR) 20 MG tablet Take 1 tablet (20 mg total) by mouth daily. 09/06/22  Yes Nilda Simmer, NP  valsartan-hydrochlorothiazide (DIOVAN-HCT) 160-25 MG tablet TAKE 1 TABLET BY MOUTH EVERY DAY 09/30/22  Yes Luking, Elayne Snare, MD  ALPRAZolam (XANAX) 0.5 MG tablet TAKE (1) TABLET BY MOUTH AT BEDTIME AS NEEDED FOR SLEEP. Patient taking differently: Take 0.5 mg by mouth at bedtime as needed for anxiety or sleep. TAKE (1) TABLET BY MOUTH AT BEDTIME AS NEEDED FOR SLEEP. 01/06/23   [name], MD  lidocaine (XYLOCAINE) 2 % solution SMARTSIG:By Mouth Patient not taking: Reported on 01/10/2023 01/03/23   [provider]  magic mouthwash (nystatin, lidocaine, diphenhydrAMINE, alum &  mag hydroxide) suspension Swish and spit 5 mLs 3 (three) times daily as needed for mouth pain. Patient not taking: Reported on 01/10/2023 01/03/23   Barrie Folk, PA-C      Allergies    Codeine    Review of Systems   Review of Systems  Constitutional:  Negative for chills and fever.  HENT:  Positive for voice change. Negative for ear pain and sore throat.   Eyes:  Negative for pain and visual disturbance.  Respiratory:  Negative for cough and shortness of breath.   Cardiovascular:  Negative for chest pain and palpitations.  Gastrointestinal:  Negative for  abdominal pain and vomiting.  Genitourinary:  Negative for dysuria and hematuria.  Musculoskeletal:  Negative for arthralgias and back pain.  Skin:  Negative for color change and rash.  Neurological:  Positive for speech difficulty and weakness. Negative for seizures and syncope.  All other systems reviewed and are negative.   Physical Exam Updated Vital Signs BP 113/78   Pulse 69   Temp 98.6 F (37 C) (Oral)   Resp (!) 21   Ht 1.473 m (4\' 10" )   Wt 63 kg   SpO2 94%   BMI 29.05 kg/m  Physical Exam Vitals and nursing note reviewed.  Constitutional:      General: She is not in acute distress.    Appearance: Normal appearance. She is well-developed.  HENT:     Head: Normocephalic and atraumatic.  Eyes:     Extraocular Movements: Extraocular movements intact.     Conjunctiva/sclera: Conjunctivae normal.     Pupils: Pupils are equal, round, and reactive to light.  Cardiovascular:     Rate and Rhythm: Normal rate and regular rhythm.     Heart sounds: No murmur heard. Pulmonary:     Effort: Pulmonary effort is normal. No respiratory distress.     Breath sounds: Normal breath sounds.  Abdominal:     Palpations: Abdomen is soft.     Tenderness: There is no abdominal tenderness.  Musculoskeletal:        General: No swelling.     Cervical back: Normal range of motion and neck supple.  Skin:    General: Skin is warm and dry.     Capillary Refill: Capillary refill takes less than 2 seconds.  Neurological:     General: No focal deficit present.     Mental Status: She is alert and oriented to person, place, and time.     Cranial Nerves: Cranial nerve deficit present.     Sensory: No sensory deficit.     Motor: No weakness.     Coordination: Coordination normal.     Comments: In the bed patient able to hold left leg up for a good period of time may be just a hint of weakness.  Heel-to-shin left leg to right lower extremity intact and also vice versa.  No evidence of any  coordination abnormalities.  No evidence of any sensory deficit.  Psychiatric:        Mood and Affect: Mood normal.     ED Results / Procedures / Treatments   Labs (all labs ordered are listed, but only abnormal results are displayed) Labs Reviewed  PROTIME-INR - Abnormal; Notable for the following components:      Result Value   Prothrombin Time 15.3 (*)    All other components within normal limits  CBC - Abnormal; Notable for the following components:   RBC 3.71 (*)    Hemoglobin 10.5 (*)  HCT 32.4 (*)    Platelets 76 (*)    All other components within normal limits  DIFFERENTIAL - Abnormal; Notable for the following components:   Neutro Abs 1.6 (*)    Eosinophils Absolute 0.8 (*)    All other components within normal limits  COMPREHENSIVE METABOLIC PANEL - Abnormal; Notable for the following components:   Total Protein 6.3 (*)    Albumin 3.4 (*)    All other components within normal limits  RAPID URINE DRUG SCREEN, HOSP PERFORMED - Abnormal; Notable for the following components:   Benzodiazepines POSITIVE (*)    All other components within normal limits  CBG MONITORING, ED - Abnormal; Notable for the following components:   Glucose-Capillary 106 (*)    All other components within normal limits  ETHANOL  APTT  URINALYSIS, ROUTINE W REFLEX MICROSCOPIC  I-STAT CHEM 8, ED    EKG None  Radiology CT HEAD WO CONTRAST  Result Date: 01/10/2023 CLINICAL DATA:  Sudden left leg weakness. Neuro deficit, acute, stroke suspected. EXAM: CT HEAD WITHOUT CONTRAST TECHNIQUE: Contiguous axial images were obtained from the base of the skull through the vertex without intravenous contrast. RADIATION DOSE REDUCTION: This exam was performed according to the departmental dose-optimization program which includes automated exposure control, adjustment of the mA and/or kV according to patient size and/or use of iterative reconstruction technique. COMPARISON:  None Available. FINDINGS: Brain: No  acute intracranial hemorrhage, midline shift or mass effect. No extra-axial fluid collection. Mild atrophy is noted. Mild periventricular white matter hypodensities are present bilaterally. No hydrocephalus. Vascular: No hyperdense vessel or unexpected calcification. Skull: Normal. Negative for fracture or focal lesion. Sinuses/Orbits: No acute finding. Other: None. IMPRESSION: 1. No acute intracranial process. 2. Atrophy with chronic microvascular ischemic changes. Electronically Signed   By: Brett Fairy M.D.   On: 01/10/2023 20:04    Procedures Procedures    Medications Ordered in ED Medications - No data to display  ED Course/ Medical Decision Making/ A&P                             Medical Decision Making Amount and/or Complexity of Data Reviewed Labs: ordered. Radiology: ordered.  Risk Decision regarding hospitalization.   Patient's head CT without any acute findings.  Urine is negative for any abnormal drugs.  Urinalysis also negative for urinary tract infection.  Alcohol less than 10.  CBC white blood count 5.0 hemoglobin 10.5 platelets a little low at 76 K but not in the danger zone.  Complete metabolic panel without any acute abnormalities.  Liver function test normal.  Again CT head without any acute findings.  Tried to ambulate the patient and she really will not move the leg when she stands up but she is able to pivot in the bed put her legs on the floor on her own but when she stands up and then she is afraid to move the left lower extremity says it will not move.  Discussed with teleneurologist specialist who will provide consultation and advice.  I think probably we need to do a medical admission overnight do MRI in the morning.  MRI was not able to be done this evening as we do not have it available that would certainly rule out any concerns for stroke.  If patient does have a stroke certainly out of the window for tPA or out of the window for any intervention at  this point in time plus her symptoms  are very minimal.  CRITICAL CARE Performed by: Fredia Sorrow Total critical care time: 35 minutes Critical care time was exclusive of separately billable procedures and treating other patients. Critical care was necessary to treat or prevent imminent or life-threatening deterioration. Critical care was time spent personally by me on the following activities: development of treatment plan with patient and/or surrogate as well as nursing, discussions with consultants, evaluation of patient's response to treatment, examination of patient, obtaining history from patient or surrogate, ordering and performing treatments and interventions, ordering and review of laboratory studies, ordering and review of radiographic studies, pulse oximetry and re-evaluation of patient's condition.  Patient seen by teleneurologist.  They thought that maybe there was some discoordination with that left leg.  They concur with the recommendation for admission and stroke workup.  Recommended a baby aspirin.  I will give the patient a baby aspirin now.  Will contact hospitalist for admission MRI in the morning is okay.  Final Clinical Impression(s) / ED Diagnoses Final diagnoses:  Left leg weakness  Cerebrovascular accident (CVA), unspecified mechanism [facility])    Rx / DC Orders ED Discharge Orders     None         Fredia Sorrow, MD 01/10/23 2207    Fredia Sorrow, MD 01/10/23 3200    Fredia Sorrow, MD 01/10/23 [phone]

## 2023-01-10 NOTE — Consult Note (Signed)
[facility] TeleNeurology Consult Services  Stat Consult  Patient Name:   Patricia [name], Patricia [name] Date of Birth:   03-13-1946 Identification Number:   MRN - [id number] Date of Service:   01/10/2023 21:40:48  Diagnosis:       R53.1 - Weakness  Impression 77 y/o woman with metastatic lung cancer who presents to the left leg weakness and numbness since yesterday. Admit for stroke evaluation.   Recommendations: Our recommendations are outlined below.  Diagnostic Studies : MRI head without contrastMRA head without contrastMRA neck with contrast Echocardiogram  Laboratory Studies : Lipid panel I ordered Hemoglobin A1c  Antithrombotic Medication : Aspirin 81 mg PO dailyPermissive hypertension, Antihypertensives with prn for first 24-48 hrs post stroke onset. If BP greater than 220/120 give Labetalol IV or Vasotec IVStatins for LDL goal less than 70  Nursing Recommendations : IV Fluids, avoid dextrose containing fluids, Maintain euglycemiaNeuro checks q4 hrs x 24 hrs and then per shiftHead of bed 30 degreesContinue with Telemetry  Consultations : Recommend Speech therapy if failed dysphagia screenPhysical therapy/Occupational therapyInpatient rehab if recommended by physical/occupational therapy Neurology f/u  DVT Prophylaxis : Choice of Primary Team   ----------------------------------------------------------------------------------------------------    Metrics: [facility] Notification Time: 01/10/2023 21:38:37 Stamp Time: 01/10/2023 21:40:48 Callback Response Time: 01/10/2023 21:50:02  Primary Provider Notified of Diagnostic Impression and Management Plan on: 01/10/2023 23:40:26     ----------------------------------------------------------------------------------------------------  Chief Complaint: left leg weakness  History of Present Illness: Patient is a 77 year old Female. 77 y/o woman with metastatic lung cancer who presents to the left  leg weakness and numbness since 0900 yesterday morning. STAT teleneurology consult. CT brain reviewed. She denies back pain or leg pain. Case discussed with Dr. Rogene Houston at the bedside. Patient's husband at the bedside. All questions answered.   Past Medical History: Other PMH:  as per HPI  Medications:  No Anticoagulant use  No Antiplatelet use Reviewed EMR for current medications  Allergies:  Reviewed  Social History: Smoking: No Alcohol Use: No Drug Use: No  Family History:  There is no family history of premature cerebrovascular disease pertinent to this consultation  ROS : 14 Points Review of Systems was performed and was negative except mentioned in HPI.  Past Surgical History: There Is No Surgical History Contributory To Today's Visit   Examination: BP(109/75), Pulse(78), Blood Glucose(106) 1A: Level of Consciousness - Alert; keenly responsive + 0 1B: Ask Month and Age - Both Questions Right + 0 1C: Blink Eyes & Squeeze Hands - Performs Both Tasks + 0 2: Test Horizontal Extraocular Movements - Normal + 0 3: Test Visual Fields - No Visual Loss + 0 4: Test Facial Palsy (Use Grimace if Obtunded) - Normal symmetry + 0 5A: Test Left Arm Motor Drift - No Drift for 10 Seconds + 0 5B: Test Right Arm Motor Drift - No Drift for 10 Seconds + 0 6A: Test Left Leg Motor Drift - Drift, but doesn't hit bed + 1 6B: Test Right Leg Motor Drift - No Drift for 5 Seconds + 0 7: Test Limb Ataxia (FNF/Heel-Shin) - No Ataxia + 0 8: Test Sensation - Normal; No sensory loss + 0 9: Test Language/Aphasia - Normal; No aphasia + 0 10: Test Dysarthria - Normal + 0 11: Test Extinction/Inattention - No abnormality + 0  NIHSS Score: 1  Spoke with : Dr. Rogene Houston    Patient / Family was informed the Neurology Consult would occur via TeleHealth consult by way of interactive audio and video telecommunications and consented to  receiving care in this manner.  Patient is being evaluated for  possible acute neurologic impairment and high probability of imminent or life - threatening deterioration.I spent total of 18 minutes providing care to this patient, including time for face to face visit via telemedicine, review of medical records, imaging studies and discussion of findings with providers, the patient and / or family.   Dr Meryl Crutch   [facility] For Inpatient follow-up with [facility] physician please call [facility] [phone]. This is not an outpatient service. Post hospital discharge, please contact hospital directly.  Please do not communicate with [facility] physicians via secure chat. If you have any questions, Please contact [facility]. Please call or reconsult our service if there are any clinical or diagnostic changes.

## 2023-01-10 NOTE — ED Notes (Signed)
Dr. Rogene Houston notified that when attempted to get pt up she c/o left leg numbness and tingling like "my leg feels as if it is asleep". Pt able to move her left leg and toes, able to bear weight, but pt reports feels numb.   Pt assisted to BR via wheelchair.

## 2023-01-10 NOTE — ED Triage Notes (Addendum)
Per EMS patient has been having sinus infection, inner ear problems for the past week. Patient states all of a sudden left leg weakness (per family patient could not bare weight and fell last night) and left arm movement painful starting last night 01/09/23. Patient states it feels muscle hurt. CBG 115 with EMS.

## 2023-01-10 NOTE — Telephone Encounter (Signed)
This nurse reached out to patients grand daughter, [name], states that patients nose did stop bleeding but she is still having difficulty bearing weight and ambulating.  Provider recommends that patient go to ED for evaluation and possible MR of brain.  Cosmos daughter acknowledged understanding and is in agreement.  No further questions or concerns noted at this time.

## 2023-01-10 NOTE — ED Notes (Signed)
MD is aware of patient symptoms and states he will see her ASAP. Patient LVO negative.

## 2023-01-10 NOTE — ED Notes (Signed)
Patient transported to CT 

## 2023-01-10 NOTE — Telephone Encounter (Signed)
This nurse received a message from this patient's grand daughter stating that patient had her first infusion on Monday 1/22 and this morning she woke up with vertigo, having nose bleeds and can barely walk.  She would like to know if theses symptoms are related to her chemo treatment and what can be done for these symptoms.  Provider has been made aware.

## 2023-01-11 ENCOUNTER — Observation Stay (HOSPITAL_COMMUNITY): Payer: Medicare HMO

## 2023-01-11 DIAGNOSIS — I6349 Cerebral infarction due to embolism of other cerebral artery: Secondary | ICD-10-CM | POA: Diagnosis not present

## 2023-01-11 DIAGNOSIS — R29898 Other symptoms and signs involving the musculoskeletal system: Secondary | ICD-10-CM | POA: Diagnosis not present

## 2023-01-11 DIAGNOSIS — E782 Mixed hyperlipidemia: Secondary | ICD-10-CM | POA: Diagnosis not present

## 2023-01-11 DIAGNOSIS — F419 Anxiety disorder, unspecified: Secondary | ICD-10-CM | POA: Diagnosis not present

## 2023-01-11 DIAGNOSIS — R490 Dysphonia: Secondary | ICD-10-CM

## 2023-01-11 DIAGNOSIS — K219 Gastro-esophageal reflux disease without esophagitis: Secondary | ICD-10-CM | POA: Diagnosis not present

## 2023-01-11 DIAGNOSIS — E7800 Pure hypercholesterolemia, unspecified: Secondary | ICD-10-CM | POA: Diagnosis not present

## 2023-01-11 DIAGNOSIS — Z2839 Other underimmunization status: Secondary | ICD-10-CM | POA: Diagnosis not present

## 2023-01-11 DIAGNOSIS — F32A Depression, unspecified: Secondary | ICD-10-CM

## 2023-01-11 DIAGNOSIS — I639 Cerebral infarction, unspecified: Secondary | ICD-10-CM | POA: Diagnosis present

## 2023-01-11 DIAGNOSIS — R29701 NIHSS score 1: Secondary | ICD-10-CM | POA: Diagnosis not present

## 2023-01-11 DIAGNOSIS — Z8249 Family history of ischemic heart disease and other diseases of the circulatory system: Secondary | ICD-10-CM | POA: Diagnosis not present

## 2023-01-11 DIAGNOSIS — I10 Essential (primary) hypertension: Secondary | ICD-10-CM

## 2023-01-11 DIAGNOSIS — Z8262 Family history of osteoporosis: Secondary | ICD-10-CM | POA: Diagnosis not present

## 2023-01-11 DIAGNOSIS — C770 Secondary and unspecified malignant neoplasm of lymph nodes of head, face and neck: Secondary | ICD-10-CM | POA: Diagnosis not present

## 2023-01-11 DIAGNOSIS — Z79899 Other long term (current) drug therapy: Secondary | ICD-10-CM | POA: Diagnosis not present

## 2023-01-11 DIAGNOSIS — C3492 Malignant neoplasm of unspecified part of left bronchus or lung: Secondary | ICD-10-CM | POA: Diagnosis not present

## 2023-01-11 DIAGNOSIS — I63133 Cerebral infarction due to embolism of bilateral carotid arteries: Secondary | ICD-10-CM | POA: Diagnosis not present

## 2023-01-11 DIAGNOSIS — Z66 Do not resuscitate: Secondary | ICD-10-CM | POA: Diagnosis not present

## 2023-01-11 DIAGNOSIS — I63443 Cerebral infarction due to embolism of bilateral cerebellar arteries: Secondary | ICD-10-CM | POA: Diagnosis not present

## 2023-01-11 DIAGNOSIS — Z87891 Personal history of nicotine dependence: Secondary | ICD-10-CM | POA: Diagnosis not present

## 2023-01-11 DIAGNOSIS — I6389 Other cerebral infarction: Secondary | ICD-10-CM | POA: Diagnosis not present

## 2023-01-11 DIAGNOSIS — M79662 Pain in left lower leg: Secondary | ICD-10-CM | POA: Diagnosis not present

## 2023-01-11 DIAGNOSIS — R69 Illness, unspecified: Secondary | ICD-10-CM | POA: Diagnosis not present

## 2023-01-11 DIAGNOSIS — I6340 Cerebral infarction due to embolism of unspecified cerebral artery: Secondary | ICD-10-CM | POA: Diagnosis not present

## 2023-01-11 DIAGNOSIS — C3412 Malignant neoplasm of upper lobe, left bronchus or lung: Secondary | ICD-10-CM | POA: Diagnosis not present

## 2023-01-11 DIAGNOSIS — M79605 Pain in left leg: Secondary | ICD-10-CM | POA: Diagnosis not present

## 2023-01-11 DIAGNOSIS — I6782 Cerebral ischemia: Secondary | ICD-10-CM | POA: Diagnosis not present

## 2023-01-11 DIAGNOSIS — R1310 Dysphagia, unspecified: Secondary | ICD-10-CM | POA: Diagnosis not present

## 2023-01-11 DIAGNOSIS — K5900 Constipation, unspecified: Secondary | ICD-10-CM | POA: Diagnosis not present

## 2023-01-11 DIAGNOSIS — Z885 Allergy status to narcotic agent status: Secondary | ICD-10-CM | POA: Diagnosis not present

## 2023-01-11 DIAGNOSIS — R519 Headache, unspecified: Secondary | ICD-10-CM | POA: Diagnosis not present

## 2023-01-11 DIAGNOSIS — K5901 Slow transit constipation: Secondary | ICD-10-CM | POA: Diagnosis not present

## 2023-01-11 DIAGNOSIS — H9201 Otalgia, right ear: Secondary | ICD-10-CM | POA: Diagnosis not present

## 2023-01-11 DIAGNOSIS — G8314 Monoplegia of lower limb affecting left nondominant side: Secondary | ICD-10-CM | POA: Diagnosis not present

## 2023-01-11 DIAGNOSIS — Z8041 Family history of malignant neoplasm of ovary: Secondary | ICD-10-CM | POA: Diagnosis not present

## 2023-01-11 DIAGNOSIS — T451X5A Adverse effect of antineoplastic and immunosuppressive drugs, initial encounter: Secondary | ICD-10-CM | POA: Diagnosis not present

## 2023-01-11 DIAGNOSIS — Z803 Family history of malignant neoplasm of breast: Secondary | ICD-10-CM | POA: Diagnosis not present

## 2023-01-11 DIAGNOSIS — Z28310 Unvaccinated for covid-19: Secondary | ICD-10-CM | POA: Diagnosis not present

## 2023-01-11 DIAGNOSIS — C781 Secondary malignant neoplasm of mediastinum: Secondary | ICD-10-CM | POA: Diagnosis not present

## 2023-01-11 LAB — COMPREHENSIVE METABOLIC PANEL
ALT: 16 U/L (ref 0–44)
AST: 17 U/L (ref 15–41)
Albumin: 3.3 g/dL — ABNORMAL LOW (ref 3.5–5.0)
Alkaline Phosphatase: 69 U/L (ref 38–126)
Anion gap: 7 (ref 5–15)
BUN: 13 mg/dL (ref 8–23)
CO2: 28 mmol/L (ref 22–32)
Calcium: 9.2 mg/dL (ref 8.9–10.3)
Chloride: 103 mmol/L (ref 98–111)
Creatinine, Ser: 0.84 mg/dL (ref 0.44–1.00)
GFR, Estimated: >60 mL/min (ref >60)
Glucose, Bld: 110 mg/dL — ABNORMAL HIGH (ref 70–99)
Potassium: 3.6 mmol/L (ref 3.5–5.1)
Sodium: 138 mmol/L (ref 135–145)
Total Bilirubin: 0.6 mg/dL (ref 0.3–1.2)
Total Protein: 5.9 g/dL — ABNORMAL LOW (ref 6.5–8.1)

## 2023-01-11 LAB — CBC
HCT: 32 % — ABNORMAL LOW (ref 36.0–46.0)
Hemoglobin: 10.6 g/dL — ABNORMAL LOW (ref 12.0–15.0)
MCH: 28.6 pg (ref 26.0–34.0)
MCHC: 33.1 g/dL (ref 30.0–36.0)
MCV: 86.5 fL (ref 80.0–100.0)
Platelets: 75 10*3/uL — ABNORMAL LOW (ref 150–400)
RBC: 3.7 MIL/uL — ABNORMAL LOW (ref 3.87–5.11)
RDW: 11.9 % (ref 11.5–15.5)
WBC: 4.9 10*3/uL (ref 4.0–10.5)
nRBC: 0 % (ref 0.0–0.2)

## 2023-01-11 LAB — LIPID PANEL
Cholesterol: 126 mg/dL (ref 0–200)
HDL: 42 mg/dL (ref >40)
LDL Cholesterol: 61 mg/dL (ref 0–99)
Total CHOL/HDL Ratio: 3 RATIO
Triglycerides: 114 mg/dL (ref <150)
VLDL: 23 mg/dL (ref 0–40)

## 2023-01-11 LAB — ECHOCARDIOGRAM COMPLETE
Area-P 1/2: 2.73 cm2
Height: 58 in
MV VTI: 2.06 cm2
S' Lateral: 2.2 cm
Weight: 2224 oz

## 2023-01-11 LAB — HEMOGLOBIN A1C
Hgb A1c MFr Bld: 6 % — ABNORMAL HIGH (ref 4.8–5.6)
Mean Plasma Glucose: 125.5 mg/dL

## 2023-01-11 MED ORDER — CLOPIDOGREL BISULFATE 75 MG PO TABS
75.0000 mg | ORAL_TABLET | Freq: Every day | ORAL | Status: DC
  Administered 2023-01-11 – 2023-01-13 (×3): 75 mg via ORAL
  Filled 2023-01-11 (×3): qty 1

## 2023-01-11 MED ORDER — METOCLOPRAMIDE HCL 5 MG/ML IJ SOLN
10.0000 mg | Freq: Four times a day (QID) | INTRAMUSCULAR | Status: DC | PRN
  Administered 2023-01-11 – 2023-01-16 (×4): 10 mg via INTRAVENOUS
  Filled 2023-01-11 (×4): qty 2

## 2023-01-11 MED ORDER — ACETAMINOPHEN 325 MG PO TABS
650.0000 mg | ORAL_TABLET | Freq: Four times a day (QID) | ORAL | Status: DC | PRN
  Administered 2023-01-15 – 2023-01-17 (×2): 650 mg via ORAL
  Filled 2023-01-11 (×7): qty 2

## 2023-01-11 MED ORDER — ADULT MULTIVITAMIN W/MINERALS CH
1.0000 | ORAL_TABLET | Freq: Every day | ORAL | Status: DC
  Administered 2023-01-11 – 2023-01-17 (×7): 1 via ORAL
  Filled 2023-01-11 (×7): qty 1

## 2023-01-11 MED ORDER — ACETAMINOPHEN 650 MG RE SUPP: 650.0000 mg | RECTAL | Status: DC | PRN

## 2023-01-11 MED ORDER — STROKE: EARLY STAGES OF RECOVERY BOOK
Freq: Once | Status: AC
  Filled 2023-01-11: qty 1

## 2023-01-11 MED ORDER — PERFLUTREN LIPID MICROSPHERE
1.0000 mL | INTRAVENOUS | Status: AC | PRN
  Administered 2023-01-11: 2 mL via INTRAVENOUS

## 2023-01-11 MED ORDER — DIPHENHYDRAMINE HCL 50 MG/ML IJ SOLN
12.5000 mg | Freq: Four times a day (QID) | INTRAMUSCULAR | Status: DC | PRN
  Administered 2023-01-11 – 2023-01-15 (×3): 12.5 mg via INTRAVENOUS
  Filled 2023-01-11 (×3): qty 1

## 2023-01-11 MED ORDER — ONDANSETRON HCL 4 MG/2ML IJ SOLN
4.0000 mg | Freq: Four times a day (QID) | INTRAMUSCULAR | Status: DC | PRN
  Administered 2023-01-14 – 2023-01-17 (×5): 4 mg via INTRAVENOUS
  Filled 2023-01-11 (×6): qty 2

## 2023-01-11 MED ORDER — PANTOPRAZOLE SODIUM 20 MG PO TBEC
20.0000 mg | DELAYED_RELEASE_TABLET | Freq: Every day | ORAL | Status: DC
  Administered 2023-01-11 – 2023-01-15 (×5): 20 mg via ORAL
  Filled 2023-01-11 (×5): qty 1

## 2023-01-11 MED ORDER — ACETAMINOPHEN 160 MG/5ML PO SOLN: 650.0000 mg | ORAL | Status: DC | PRN

## 2023-01-11 MED ORDER — GADOBUTROL 1 MMOL/ML IV SOLN
6.3000 mL | Freq: Once | INTRAVENOUS | Status: AC | PRN
  Administered 2023-01-11: 6.3 mL via INTRAVENOUS

## 2023-01-11 MED ORDER — FOLIC ACID 1 MG PO TABS
1.0000 mg | ORAL_TABLET | Freq: Every day | ORAL | Status: DC
  Administered 2023-01-11 – 2023-01-17 (×7): 1 mg via ORAL
  Filled 2023-01-11 (×7): qty 1

## 2023-01-11 MED ORDER — ACETAMINOPHEN 325 MG PO TABS
650.0000 mg | ORAL_TABLET | ORAL | Status: DC | PRN
  Administered 2023-01-11 – 2023-01-17 (×5): 650 mg via ORAL
  Filled 2023-01-11 (×2): qty 2

## 2023-01-11 MED ORDER — ALPRAZOLAM 0.5 MG PO TABS
0.5000 mg | ORAL_TABLET | Freq: Every evening | ORAL | Status: DC | PRN
  Administered 2023-01-17: 0.5 mg via ORAL
  Filled 2023-01-11: qty 1

## 2023-01-11 MED ORDER — ROSUVASTATIN CALCIUM 20 MG PO TABS
20.0000 mg | ORAL_TABLET | Freq: Every day | ORAL | Status: DC
  Administered 2023-01-11 – 2023-01-17 (×7): 20 mg via ORAL
  Filled 2023-01-11 (×7): qty 1

## 2023-01-11 MED ORDER — HEPARIN SODIUM (PORCINE) 5000 UNIT/ML IJ SOLN
5000.0000 [IU] | Freq: Three times a day (TID) | INTRAMUSCULAR | Status: DC
  Administered 2023-01-11 – 2023-01-13 (×7): 5000 [IU] via SUBCUTANEOUS
  Filled 2023-01-11 (×7): qty 1

## 2023-01-11 MED ORDER — SENNOSIDES-DOCUSATE SODIUM 8.6-50 MG PO TABS
1.0000 | ORAL_TABLET | Freq: Every evening | ORAL | Status: DC | PRN
  Administered 2023-01-12: 1 via ORAL
  Filled 2023-01-11: qty 1

## 2023-01-11 MED ORDER — PAROXETINE HCL 20 MG PO TABS
40.0000 mg | ORAL_TABLET | Freq: Every morning | ORAL | Status: DC
  Administered 2023-01-11 – 2023-01-17 (×7): 40 mg via ORAL
  Filled 2023-01-11 (×7): qty 2

## 2023-01-11 MED ORDER — MELATONIN 3 MG PO TABS
6.0000 mg | ORAL_TABLET | Freq: Every evening | ORAL | Status: DC | PRN
  Administered 2023-01-14 – 2023-01-16 (×3): 6 mg via ORAL
  Filled 2023-01-11 (×3): qty 2

## 2023-01-11 MED ORDER — ASPIRIN 81 MG PO TBEC
81.0000 mg | DELAYED_RELEASE_TABLET | Freq: Every day | ORAL | Status: DC
  Administered 2023-01-11 – 2023-01-13 (×3): 81 mg via ORAL
  Filled 2023-01-11 (×3): qty 1

## 2023-01-11 NOTE — Assessment & Plan Note (Addendum)
-   Holding valsartan and hydrochlorothiazide secondary to permissive hypertension; but BP also low as is

## 2023-01-11 NOTE — Evaluation (Signed)
Occupational Therapy Evaluation Patient Details Name: Patricia [name] MRN: [id number] DOB: March 06, 1946 Today's Date: 01/11/2023   History of Present Illness 77 y.o. female presents the ED 01/10/23 with a chief complaint of left leg weakness. Also with recent vertigo and fall due to vertigo. MRI  Small scattered acute and subacute infarcts in the bilateral cerebral hemisphere suggesting central embolic disease. Enhancing nodule in the right frontal cortex is most likely a subacute infarct  but recommend follow-up in 6-8 weeks given active malignancy.  PMH significant of depression, GERD, hyperlipidemia, non-small cell lung cancer with last treatment being January 22,   Clinical Impression   PTA, pt was independent. Upon eval, pt performing transfers with mod A +2, UB ADL with up to max A and LB Adl with up to max A +2. Pt with decreased LLE strength, balance, safety, awareness, L lateral lean, and decreased insight. Pt's husband able to provide 24/7 supervision upon return home. Recommending AIR to optimize safety and independence in ADL and IADL.      Recommendations for follow up therapy are one component of a multi-disciplinary discharge planning process, led by the attending physician.  Recommendations may be updated based on patient status, additional functional criteria and insurance authorization.   Follow Up Recommendations  Acute inpatient rehab (3hours/day)     Assistance Recommended at Discharge Frequent or constant Supervision/Assistance  Patient can return home with the following Two people to help with walking and/or transfers;Two people to help with bathing/dressing/bathroom;Assistance with cooking/housework;Direct supervision/assist for medications management;Direct supervision/assist for financial management;Assist for transportation;Help with stairs or ramp for entrance    Functional Status Assessment  Patient has had a recent decline in their functional status and demonstrates  the ability to make significant improvements in function in a reasonable and predictable amount of time.  Equipment Recommendations  Other (comment) (defer)    Recommendations for Other Services Rehab consult;Speech consult     Precautions / Restrictions Precautions Precautions: Fall      Mobility Bed Mobility Overal bed mobility: Needs Assistance Bed Mobility: Rolling, Sidelying to Sit, Sit to Sidelying Rolling: Min assist Sidelying to sit: Mod assist     Sit to sidelying: Mod assist General bed mobility comments: roll to left, left side to sit with assist and cues for sequencing; assist to move LLE over EoB and to raise torso; return to bed with assist to raise LLE    Transfers Overall transfer level: Needs assistance Equipment used: None Transfers: Sit to/from Stand Sit to Stand: Mod assist, +2 physical assistance           General transfer comment: +lean to left with 2 person assist to correct to midline; stood x2 for pericare      Balance Overall balance assessment: Needs assistance Sitting-balance support: Single extremity supported, Feet supported Sitting balance-Leahy Scale: Poor Sitting balance - Comments: left lean even with RUE support   Standing balance support: Bilateral upper extremity supported, Reliant on assistive device for balance Standing balance-Leahy Scale: Poor                             ADL either performed or assessed with clinical judgement   ADL Overall ADL's : Needs assistance/impaired Eating/Feeding: Bed level;Modified independent   Grooming: Moderate assistance;Sitting Grooming Details (indicate cue type and reason): up to mod A for sitting balance Upper Body Bathing: Maximal assistance;Sitting;Moderate assistance Upper Body Bathing Details (indicate cue type and reason): due to poor sitting balance  Lower Body Bathing: Maximal assistance;Sit to/from stand;+2 for physical assistance   Upper Body Dressing : Maximal  assistance;Sitting Upper Body Dressing Details (indicate cue type and reason): due to poor sitting balance Lower Body Dressing: Maximal assistance;+2 for safety/equipment;+2 for physical assistance;Sit to/from stand   Toilet Transfer: Moderate assistance;+2 for physical assistance;+2 for safety/equipment Toilet Transfer Details (indicate cue type and reason): STS this session Toileting- Clothing Manipulation and Hygiene: Total assistance;Sit to/from stand;+2 for physical assistance Toileting - Clothing Manipulation Details (indicate cue type and reason): +2 to maintain standing balance and total A for pericare     Functional mobility during ADLs: Moderate assistance;+2 for physical assistance;+2 for safety/equipment General ADL Comments: STS transfers this session. Limited by LLE weakness     Vision Baseline Vision/History: 0 No visual deficits Ability to See in Adequate Light: 0 Adequate Patient Visual Report: No change from baseline Vision Assessment?: No apparent visual deficits     Perception Perception Perception Tested?: No   Praxis Praxis Praxis tested?: Not tested    Pertinent Vitals/Pain       Hand Dominance Right   Extremity/Trunk Assessment     Lower Extremity Assessment LLE Deficits / Details: hip flexion 2+, hip extension 2+; knee extension 2+, ankle DF 2+ LLE Sensation: WNL (pt denies numbness)   Cervical / Trunk Assessment Cervical / Trunk Assessment: Other exceptions Cervical / Trunk Exceptions: trunk left leaning in sit and stand   Communication Communication Communication: Expressive difficulties (very low, hoarse voice)   Cognition Arousal/Alertness: Awake/alert Behavior During Therapy: WFL for tasks assessed/performed Overall Cognitive Status: Impaired/Different from baseline Area of Impairment: Attention, Safety/judgement, Awareness, Problem solving                   Current Attention Level: Selective     Safety/Judgement: Decreased  awareness of safety, Decreased awareness of deficits Awareness: Intellectual Problem Solving: Decreased initiation, Requires verbal cues, Requires tactile cues General Comments: decr awareness of left leaning in sitting and requires assist to correct/maintain midline     General Comments  VSS    Exercises     Shoulder Instructions      Home Living Family/patient expects to be discharged to:: Private residence Living Arrangements: Spouse/significant other Available Help at Discharge: Family Type of Home: House Home Access: Stairs to enter Technical brewer of Steps: 4 Entrance Stairs-Rails: Right;Left Home Layout: One level     Bathroom Shower/Tub: Occupational psychologist: Standard Bathroom Accessibility: Yes   Home Equipment: None          Prior Functioning/Environment Prior Level of Function : Independent/Modified Independent             Mobility Comments: working out 4-5 days/week; working 10 hr days          OT Problem List: Decreased strength;Decreased activity tolerance;Impaired balance (sitting and/or standing);Decreased cognition;Decreased safety awareness;Decreased knowledge of use of DME or AE      OT Treatment/Interventions: Self-care/ADL training;DME and/or AE instruction;Balance training;Patient/family education;Cognitive remediation/compensation;Therapeutic activities    OT Goals(Current goals can be found in the care plan section) Acute Rehab OT Goals Patient Stated Goal: get better OT Goal Formulation: With patient Time For Goal Achievement: 01/25/23 Potential to Achieve Goals: Good  OT Frequency: Min 2X/week    Co-evaluation PT/OT/SLP Co-Evaluation/Treatment: Yes Reason for Co-Treatment: Complexity of the patient's impairments (multi-system involvement);For patient/therapist safety;To address functional/ADL transfers PT goals addressed during session: Mobility/safety with mobility OT goals addressed during session: ADL's  and self-care  AM-PAC OT "6 Clicks" Daily Activity     Outcome Measure Help from another person eating meals?: None Help from another person taking care of personal grooming?: A Little Help from another person toileting, which includes using toliet, bedpan, or urinal?: A Lot Help from another person bathing (including washing, rinsing, drying)?: A Lot Help from another person to put on and taking off regular upper body clothing?: A Lot Help from another person to put on and taking off regular lower body clothing?: A Lot 6 Click Score: 15   End of Session Equipment Utilized During Treatment: Gait belt Nurse Communication: Mobility status  Activity Tolerance: Patient tolerated treatment well Patient left: in bed;with call bell/phone within reach;with bed alarm set;with family/visitor present  OT Visit Diagnosis: Unsteadiness on feet (R26.81);Muscle weakness (generalized) (M62.81);Other abnormalities of gait and mobility (R26.89);History of falling (Z91.81);Other symptoms and signs involving cognitive function                Time: 1229-1301 OT Time Calculation (min): 32 min Charges:  OT General Charges $OT Visit: 1 Visit OT Evaluation $OT Eval Moderate Complexity: 1 Mod  Elder Cyphers, OTR/L [facility] Acute Rehabilitation Office: [phone]   Magnus Ivan 01/11/2023, 5:39 PM

## 2023-01-11 NOTE — Assessment & Plan Note (Signed)
-   follows with Dr. Julien Nordmann; diagnosed Jan 2024 - Stage IV (T1c, N3, M1 C) non-small cell lung cancer, adenocarcinoma presented with left upper lobe metastatic neoplasm presented with left lower lobe lung nodule in addition to extensive lymphadenopathy involving the neck, left hilar and mediastinal lymphadenopathy as well as upper abdomen lymph nodes and multiple subcutaneous/muscular nodules diagnosed in January 2024.  -I have discussed with Dr. Julien Nordmann.  Chemo okay to resume after discharge from CIR

## 2023-01-11 NOTE — Assessment & Plan Note (Signed)
-   Continue Xanax and Paxil - UDS shows benzos which is expected with patient having prescription for Xanax

## 2023-01-11 NOTE — Evaluation (Signed)
Clinical/Bedside Swallow Evaluation Patient Details  Name: Patricia [name] MRN: [id number] Date of Birth: 03-Jul-1946  Today's Date: 01/11/2023 Time: SLP Start Time (ACUTE ONLY): 3295 SLP Stop Time (ACUTE ONLY): 1884 SLP Time Calculation (min) (ACUTE ONLY): 27 min  Past Medical History:  Past Medical History:  Diagnosis Date   Depression    GERD (gastroesophageal reflux disease)    Hypercholesterolemia    IFG (impaired fasting glucose)    Osteopenia    PONV (postoperative nausea and vomiting)    Past Surgical History:  Past Surgical History:  Procedure Laterality Date   ABDOMINAL HYSTERECTOMY     CATARACT EXTRACTION W/PHACO  09/12/2011   Procedure: CATARACT EXTRACTION PHACO AND INTRAOCULAR LENS PLACEMENT (Patricia [name]);  Surgeon: Patricia [name];  Location: [facility];  Service: Ophthalmology;  Laterality: Left;  CDE: 8.91   CATARACT EXTRACTION W/PHACO  11/18/2011   Procedure: CATARACT EXTRACTION PHACO AND INTRAOCULAR LENS PLACEMENT ([facility]);  Surgeon: Patricia [name];  Location: [facility];  Service: Ophthalmology;  Laterality: Right;  CDE:10.26   COLONOSCOPY  2007   Dr. Gala [name]: internal hemorrhoids, single anal papilla poor prep.    COLONOSCOPY N/A 03/27/2017   Patricia [name]: Normal exam.   ECTOPIC PREGNANCY SURGERY     removal of right tube   ESOPHAGOGASTRODUODENOSCOPY (EGD) WITH PROPOFOL N/A 09/10/2021   Procedure: ESOPHAGOGASTRODUODENOSCOPY (EGD) WITH PROPOFOL;  Surgeon: Patricia Dolin, MD;  [facility]: [facility];  Service: Endoscopy;  Laterality: N/A;  8:15AM   MALONEY DILATION N/A 09/10/2021   Procedure: Patricia [name] DILATION;  Surgeon: Patricia Dolin, MD;  [facility]: [facility];  Service: Endoscopy;  Laterality: N/A;   OVARIAN CYST REMOVAL     right   RECTOCELE REPAIR N/A 01/18/2014   Procedure: POSTERIOR REPAIR (RECTOCELE);  Surgeon: Patricia Kind, MD;  Location: [facility];  Service: Gynecology;  Laterality: N/A;   VAGINAL HYSTERECTOMY N/A 01/18/2014   Procedure: HYSTERECTOMY VAGINAL;  Surgeon: Patricia Kind, MD;  Location: [facility];  Service: Gynecology;  Laterality: N/A;   HPI:  Ms. Patricia [name] is a 77 yo female with PMH Stage IV NSCLC adenocarcinoma (extensive lymphadenopathy involving the neck, left hilar and mediastinal lymphadenopathy as well as upper abdomen lymph nodes and multiple subcutaneous/muscular nodules diagnosed in January 2024) on palliative systemic chemo, depression, GERD, HLD.  She presented with acute onset of left lower extremity weakness notably dragging her left leg starting on approximately Thursday.  She was recently seen approximately 1 week ago for new onset of headache and had no focal neurodeficits at that time.  She was admitted for stroke workup.  MRI brain was performed which showed small scattered acute and subacute infarcts in the bilateral cerebral hemisphere; MRI 01/11/23 revealed  Small scattered acute and subacute infarcts in the bilateral  cerebral hemisphere suggesting central embolic disease. Enhancing  nodule in the right frontal cortex is most likely a subacute infarct  but recommend follow-up in 6-8 weeks given active malignancy.  BSE generated to assess swallow function.    Assessment / Plan / Recommendation  Clinical Impression  Pt seen for clinical swallowing evaluation with granddaughters present during assessment.  Pt with primarily aphonic vocal quality with periods of intermittent breathiness and weak cough/throat clearing ability.  Pt dx of L vocal fold paralysis per family report from prior ENT visit, although notes could not be accessed via chart review.   Pt with coughing episode this am per nursing which generated BSE paired with new CVA dx.  Pt evaluated with various consistencies and one episode  of coughing when pt was speaking during meal.  Otherwise, pt consumed thin via straw, puree and soft (Dysphagia 3) consistencies without overt s/sx of aspiration present.  Pt is at mild-mod risk for aspiration d/t pharyngeal CA dx, new onset CVA, deconditioning  and weak cough.  OME unremarkable.  Pt stated "I cough occasionally during meals." Recommend continue current diet with swallowing precautions including small bites/sips, slow rate, maintaining sustained attention during meals and alternating solids/liquids with repetitive swallows prn during oral intake.  Moistening meats/dry consistencies prior to PO intake may prove beneficial.  Recommend MBS completion during hospitalization to assess swallow function d/t risk associated with pharyngeal cancer and swallowing function.  No prior ST noted in chart.  ST will f/u for dysphagia tx, objective testing (MBS) and ongoing cognitive assessment during acute stay.  Thank you for this consult.  SLP Visit Diagnosis: Dysphagia, pharyngeal phase (R13.13)    Aspiration Risk  Mild aspiration risk Moderate aspiration risk   Diet Recommendation   Dysphagia 3/thin liquids  Medication Administration: Whole meds with liquid as tolerated or whole/puree   Other  Recommendations Oral Care Recommendations: Oral care BID    Recommendations for follow up therapy are one component of a multi-disciplinary discharge planning process, led by the attending physician.  Recommendations may be updated based on patient status, additional functional criteria and insurance authorization.  Follow up Recommendations Other (comment) (TBD)      Assistance Recommended at Discharge  TBD  Functional Status Assessment Patient has had a recent decline in their functional status and demonstrates the ability to make significant improvements in function in a reasonable and predictable amount of time.  Frequency and Duration min 2x/week  1 week       Prognosis Prognosis for Safe Diet Advancement: Good      Swallow Study   General Date of Onset: 01/10/23 HPI: Ms. Patricia [name] is a 77 yo female with PMH Stage IV NSCLC adenocarcinoma (extensive lymphadenopathy involving the neck, left hilar and mediastinal lymphadenopathy as well as upper  abdomen lymph nodes and multiple subcutaneous/muscular nodules diagnosed in January 2024) on palliative systemic chemo, depression, GERD, HLD.   She presented with acute onset of left lower extremity weakness notably dragging her left leg starting on approximately Thursday.  She was recently seen approximately 1 week ago for new onset of headache and had no focal neurodeficits at that time.  She was admitted for stroke workup.  MRI brain was performed which showed small scattered acute and subacute infarcts in the bilateral cerebral hemisphere; MRI 01/11/23 revealed  Small scattered acute and subacute infarcts in the bilateral  cerebral hemisphere suggesting central embolic disease. Enhancing  nodule in the right frontal cortex is most likely a subacute infarct  but recommend follow-up in 6-8 weeks given active malignancy.  BSE generated to assess swallow function. Type of Study: Bedside Swallow Evaluation Previous Swallow Assessment: n/a Diet Prior to this Study: Thin liquids;Dysphagia 3 (soft) Temperature Spikes Noted: No Respiratory Status: Room air History of Recent Intubation: No Behavior/Cognition: Alert;Cooperative Oral Cavity Assessment: Within Functional Limits Oral Care Completed by SLP: No Oral Cavity - Dentition: Adequate natural dentition Vision: Functional for self-feeding Self-Feeding Abilities: Able to feed self Patient Positioning: Upright in bed Baseline Vocal Quality: Low vocal intensity;Breathy Volitional Cough: Weak Volitional Swallow: Able to elicit    Oral/Motor/Sensory Function Overall Oral Motor/Sensory Function: Within functional limits   Ice Chips Ice chips: Not tested   Thin Liquid Thin Liquid: Impaired Presentation: Straw Other Comments: intermittent c/o  coughing with liquids during meals; not seen during BSE    Nectar Thick Nectar Thick Liquid: Not tested   Honey Thick Honey Thick Liquid: Not tested   Puree Puree: Impaired Presentation: Self Fed Pharyngeal  Phase Impairments: Other (comments) Other Comments: ENT report (per family) of L vocal cord paralysis/potential airway involvement   Solid     Solid: Impaired Presentation: Self Fed Pharyngeal Phase Impairments: Multiple swallows;Other (comments) (coughing intermittently during intake)      Pat Vaani Morren,M.S., CCC-SLP 01/11/2023,5:09 PM

## 2023-01-11 NOTE — H&P (Signed)
History and Physical    Patient: Patricia [name] BSJ:[id number] DOB: 14-Aug-1946 DOA: 01/10/2023 DOS: the patient was seen and examined on 01/11/2023 PCP: Curt Bears, MD  Patient coming from: Home  Chief Complaint:  Chief Complaint  Patient presents with   Weakness   HPI: Patricia [name] is a 77 y.o. female with medical history significant of depression, GERD, hyperlipidemia, non-small cell lung cancer with last treatment being January 22, and more presents the ED with a chief complaint of left leg weakness.  Patient has difficulty remembering the whole story.  She reports that she had a sinus infection and started feeling generalized weakness and fatigue.  She was treated with Augmentin.  She then had right ear pain and vertigo.  Yesterday she stated laying down most today because of the vertigo.  When she stood up off the couch she fell down to the floor because of the vertigo.  Her husband helped her to the bathroom and then she fell down on the floor in the bathroom and hit her head.  She thinks that that was due to the vertigo but does not really remember.  Husband reports that she did not lose consciousness.  She cannot remember if her left leg was weak at that time or not.  Today when they got up to take her to the bathroom she was dragging her left leg.  She reported that she could not put any weight on it because she was worried she would fall to the left side.  She had no numbness.  Her left upper extremity was normal strength.  She had no facial droop, slurred speech, change in vision, change in hearing, or other symptoms.  She had no change in her normal dysphagia.  Patient denies chest pain, palpitations, dyspnea.  She has no other complaints at this time.  Patient does not smoke, does not drink, does not use illicit drugs.  She is not vaccinated for COVID or flu.  Patient reports that she would prefer to be DNR Review of Systems: As mentioned in the history of present illness. All  other systems reviewed and are negative. Past Medical History:  Diagnosis Date   Depression    GERD (gastroesophageal reflux disease)    Hypercholesterolemia    IFG (impaired fasting glucose)    Osteopenia    PONV (postoperative nausea and vomiting)    Past Surgical History:  Procedure Laterality Date   ABDOMINAL HYSTERECTOMY     CATARACT EXTRACTION W/PHACO  09/12/2011   Procedure: CATARACT EXTRACTION PHACO AND INTRAOCULAR LENS PLACEMENT ([place]);  Surgeon: Tonny Branch;  Location: [facility];  Service: Ophthalmology;  Laterality: Left;  CDE: 8.91   CATARACT EXTRACTION W/PHACO  11/18/2011   Procedure: CATARACT EXTRACTION PHACO AND INTRAOCULAR LENS PLACEMENT ([facility]);  Surgeon: Tonny Branch;  Location: [facility];  Service: Ophthalmology;  Laterality: Right;  CDE:10.26   COLONOSCOPY  2007   Dr. Gala Romney: internal hemorrhoids, single anal papilla poor prep.    COLONOSCOPY N/A 03/27/2017   [place]: Normal exam.   ECTOPIC PREGNANCY SURGERY     removal of right tube   ESOPHAGOGASTRODUODENOSCOPY (EGD) WITH PROPOFOL N/A 09/10/2021   Procedure: ESOPHAGOGASTRODUODENOSCOPY (EGD) WITH PROPOFOL;  Surgeon: Daneil Dolin, MD;  [facility];  Service: Endoscopy;  Laterality: N/A;  8:15AM   MALONEY DILATION N/A 09/10/2021   Procedure: Venia Minks DILATION;  Surgeon: Daneil Dolin, MD;  [facility];  Service: Endoscopy;  Laterality: N/A;   OVARIAN CYST REMOVAL  right   RECTOCELE REPAIR N/A 01/18/2014   Procedure: POSTERIOR REPAIR (RECTOCELE);  Surgeon: Jonnie Kind, MD;  Location: [facility];  Service: Gynecology;  Laterality: N/A;   VAGINAL HYSTERECTOMY N/A 01/18/2014   Procedure: HYSTERECTOMY VAGINAL;  Surgeon: Jonnie Kind, MD;  Location: [facility];  Service: Gynecology;  Laterality: N/A;   Social History:  reports that she quit smoking about 38 years ago. Her smoking use included cigarettes. She has a 20.00 pack-year smoking history. She has never used smokeless tobacco. She reports that she  does not drink alcohol and does not use drugs.  Allergies  Allergen Reactions   Codeine Nausea Only    Family History  Problem Relation Age of Onset   Heart attack Father    Osteoporosis Mother    Heart attack Brother 23       MI   Breast cancer Maternal Aunt    Breast cancer Paternal Aunt    Cancer Paternal Aunt        ovarian cancer   Anesthesia problems Neg Hx    Hypotension Neg Hx    Malignant hyperthermia Neg Hx    Pseudochol deficiency Neg Hx    Colon cancer Neg Hx     Prior to Admission medications   Medication Sig Start Date End Date Taking? Authorizing Provider  Cholecalciferol (VITAMIN D3) 125 MCG (5000 UT) CAPS Take 5,000 Units by mouth daily.   Yes [provider]  folic acid (FOLVITE) 1 MG tablet Take 1 tablet (1 mg total) by mouth daily. 12/19/22  Yes Curt Bears, MD  lansoprazole (PREVACID) 30 MG capsule Take one po qd for acid reflux 08/23/22  Yes Nilda Simmer, NP  meclizine (ANTIVERT) 25 MG tablet Take one tablet po TID prn dizziness 03/01/22  Yes Nilda Simmer, NP  Multiple Vitamin (MULTIVITAMIN WITH MINERALS) TABS tablet Take 1 tablet by mouth daily.   Yes [provider]  naproxen sodium (ALEVE) 220 MG tablet Take 220 mg by mouth daily as needed (pain).   Yes [provider]  PARoxetine (PAXIL) 40 MG tablet TAKE 1 TABLET BY MOUTH EVERY DAY IN THE MORNING Patient taking differently: Take 40 mg by mouth in the morning. 10/11/22  Yes Nilda Simmer, NP  prochlorperazine (COMPAZINE) 10 MG tablet Take 1 tablet (10 mg total) by mouth every 6 (six) hours as needed for nausea or vomiting. 12/19/22  Yes Curt Bears, MD  rosuvastatin (CRESTOR) 20 MG tablet Take 1 tablet (20 mg total) by mouth daily. 09/06/22  Yes Nilda Simmer, NP  valsartan-hydrochlorothiazide (DIOVAN-HCT) 160-25 MG tablet TAKE 1 TABLET BY MOUTH EVERY DAY 09/30/22  Yes Luking, Elayne Snare, MD  ALPRAZolam (XANAX) 0.5 MG tablet TAKE (1) TABLET BY MOUTH AT  BEDTIME AS NEEDED FOR SLEEP. Patient taking differently: Take 0.5 mg by mouth at bedtime as needed for anxiety or sleep. TAKE (1) TABLET BY MOUTH AT BEDTIME AS NEEDED FOR SLEEP. 01/06/23   [name], MD  lidocaine (XYLOCAINE) 2 % solution SMARTSIG:By Mouth Patient not taking: Reported on 01/10/2023 01/03/23   [provider]  magic mouthwash (nystatin, lidocaine, diphenhydrAMINE, alum & mag hydroxide) suspension Swish and spit 5 mLs 3 (three) times daily as needed for mouth pain. Patient not taking: Reported on 01/10/2023 01/03/23   Barrie Folk, PA-C    Physical Exam: Vitals:   01/10/23 2300 01/11/23 0000 01/11/23 0030 01/11/23 0100  BP: 113/78 (!) 116/103 114/88 111/68  Pulse: 69 74 79 86  Resp: Marland Kitchen)  21 17 19 20   Temp:      TempSrc:      SpO2: 94% 95% 96% 95%  Weight:      Height:        Data Reviewed: 1.  General: Patient lying supine in bed,  no acute distress   2. Psychiatric: Alert and oriented x 3, mood and behavior normal for situation, pleasant and cooperative with exam   3. Neurologic: Speech and language are normal, face is symmetric, moves all 4 extremities voluntarily, left lower extremity is subtly weaker than right lower extremity, with some ataxia on heel-to-shin,  4. HEENMT:  Head is atraumatic, normocephalic, pupils reactive to light, neck is supple, trachea is midline, mucous membranes are moist, chronically hoarse voice   5. Respiratory : Lungs are clear to auscultation bilaterally without wheezing, rhonchi, rales, no cyanosis, no increase in work of breathing or accessory muscle use   6. Cardiovascular : Heart rate normal, rhythm is regular, no murmurs, rubs or gallops, no peripheral edema, peripheral pulses palpated   7. Gastrointestinal:  Abdomen is soft, nondistended, nontender to palpation bowel sounds active, no masses or organomegaly palpated   8. Skin:  Skin is warm, dry and intact without rashes, acute lesions, or ulcers on  limited exam   9.Musculoskeletal:  No acute deformities or trauma, no asymmetry in tone, no peripheral edema, peripheral pulses palpated, no tenderness to palpation in the extremities  Results reviewed In the ED Temp 98.6,-98.9, heart rate 67-73, respiratory rate 16-22, blood pressure 97/60-134/84, satting 94% on room air No leukocytosis with white blood cell count of 5.0, hemoglobin 10.5, platelets 76-likely thrombocytopenia is related to oncologic treatments Chemistry is unremarkable Albumin is low at 3.4 UDS is positive for benzos for which she has a prescription CT head shows no acute intracranial abnormality Alcohol levels undetectable EKG shows a heart rate of 65, sinus rhythm, QTc 424 Neuro consulted and recommends MRI head without contrast, MRA head without contrast, MRA neck with contrast, echo, lipid panel, hemoglobin A1c, daily aspirin, permissive hypertension Admission requested for stroke workup Admitted at [facility] as [facility] does not have MRI on weekends  Assessment and Plan: * Left leg weakness - Stroke workup - MRI brain in the a.m. - MRA head and neck - Echo in the a.m. - Start once daily aspirin - Inpatient neuroconsult - Hemoglobin A1c in the a.m. - Permissive hypertension - Continue to monitor  Hoarseness - Chronic, due to cancer and oncologic treatments  Primary hypertension - Holding valsartan and hydrochlorothiazide secondary to permissive hypertension  Anxiety and depression - Continue Xanax and Paxil - UDS shows benzos which is expected with patient having prescription for Xanax  GERD (gastroesophageal reflux disease) - Continue PPI  Hyperlipemia - Continue Crestor - Lipid panel in the a.m.      Advance Care Planning:   Code Status: DNR  Consults: Neuro  Family Communication: Husband at bedside  Severity of Illness: The appropriate patient status for this patient is OBSERVATION. Observation status is judged to be reasonable and  necessary in order to provide the required intensity of service to ensure the patient's safety. The patient's presenting symptoms, physical exam findings, and initial radiographic and laboratory data in the context of their medical condition is felt to place them at decreased risk for further clinical deterioration. Furthermore, it is anticipated that the patient will be medically stable for discharge from the hospital within 2 midnights of admission.   Author: Rolla Plate, DO 01/11/2023 1:36  AM  For on call review [email or web address].

## 2023-01-11 NOTE — Progress Notes (Signed)
PT Cancellation Note  Patient Details Name: Patricia [name] MRN: [id number] DOB: September 27, 1946   Cancelled Treatment:    Reason Eval/Treat Not Completed: Patient at procedure or test/unavailable  Will continue attempts   [place]  Office [phone]  Rexanne Mano 01/11/2023, 9:15 AM

## 2023-01-11 NOTE — Progress Notes (Signed)
Progress Note    Patricia [name]   XNA:[id number]  DOB: December 16, 1945  DOA: 01/10/2023     0 PCP: Curt Bears, MD  Initial CC: LLE weakness  Hospital Course: Patricia [name] is a 77 yo female with PMH Stage IV NSCLC adenocarcinoma (extensive lymphadenopathy involving the neck, left hilar and mediastinal lymphadenopathy as well as upper abdomen lymph nodes and multiple subcutaneous/muscular nodules diagnosed in January 2024) on palliative systemic chemo, depression, GERD, HLD.  She presented with acute onset of left lower extremity weakness notably dragging her left leg starting on approximately Thursday.  She was recently seen approximately 1 week ago for new onset of headache and had no focal neurodeficits at that time. She was admitted for stroke workup.  MRI brain was performed which showed small scattered acute and subacute infarcts in the bilateral cerebral hemisphere.  Interval History:  Seen this morning with husband and her son present bedside.  MRI findings not available at that time but exam concerning for focal weakness in left lower extremity.  Very mild weakness in left upper extremity. After MRI results available, informed patient of findings and that neurology was consulted.  Assessment and Plan: * Acute CVA (cerebrovascular accident) [facility]) - Presented with left lower extremity weakness onset approximately Thursday.  Admitted for stroke workup - MRI brain revealing small scattered acute and subacute infarcts in the bilateral cerebral hemisphere; enhancing nodule in the right frontal cortex suggesting subacute infarct but recommended for follow-up 6 to 8 weeks given her underlying malignancy - neurology consulted - LDL 61. A1c 6% -Continue aspirin and Plavix for now - PT/OT recommending CIR - follow up SLP eval - follow up echo - suspect may need Zio/cardiac monitor at discharge if echo negative given acute/subacute B/L infarcts -Follow-up further neurology  recommendations  Primary hypertension - Holding valsartan and hydrochlorothiazide secondary to permissive hypertension  Hoarseness - Chronic, due to cancer and oncologic treatments  Adenocarcinoma of left lung, stage 4 ([place]) - follows with Dr. Julien Nordmann; diagnosed Jan 2024 - Stage IV (T1c, N3, M1 C) non-small cell lung cancer, adenocarcinoma presented with left upper lobe metastatic neoplasm presented with left lower lobe lung nodule in addition to extensive lymphadenopathy involving the neck, left hilar and mediastinal lymphadenopathy as well as upper abdomen lymph nodes and multiple subcutaneous/muscular nodules diagnosed in January 2024.   Anxiety and depression - Continue Xanax and Paxil - UDS shows benzos which is expected with patient having prescription for Xanax  GERD (gastroesophageal reflux disease) - Continue PPI  HLD (hyperlipidemia) - Continue Crestor - LDL 74   Old records reviewed in assessment of this patient  Antimicrobials:   DVT prophylaxis:  heparin injection 5,000 Units Start: 01/11/23 0600 SCD's Start: 01/11/23 0446   Code Status:   Code Status: DNR  Mobility Assessment (last 72 hours)     Mobility Assessment     Row Name 01/11/23 1233 01/11/23 0930 01/11/23 0403       Does patient have an order for bedrest or is patient medically unstable -- No - Continue assessment No - Continue assessment     What is the highest level of mobility based on the progressive mobility assessment? Level 3 (Stands with assist) - Balance while standing  and cannot march in place Level 3 (Stands with assist) - Balance while standing  and cannot march in place Level 3 (Stands with assist) - Balance while standing  and cannot march in place  Barriers to discharge:  Disposition Plan:  CIR Status is: Obs, needing inpt  Objective: Blood pressure 110/79, pulse 70, temperature 98.3 F (36.8 C), temperature source Oral, resp. rate 17, height 4\' 10"  (1.473  m), weight 63 kg, SpO2 98 %.  Examination:  Physical Exam Constitutional:      General: She is not in acute distress.    Appearance: Normal appearance.     Comments: Whispering voice (baseline)  HENT:     Head: Normocephalic and atraumatic.     Mouth/Throat:     Mouth: Mucous membranes are moist.  Eyes:     Extraocular Movements: Extraocular movements intact.  Cardiovascular:     Rate and Rhythm: Normal rate and regular rhythm.  Pulmonary:     Effort: No respiratory distress.     Breath sounds: Normal breath sounds. No wheezing.  Abdominal:     General: Bowel sounds are normal. There is no distension.     Palpations: Abdomen is soft.     Tenderness: There is no abdominal tenderness.  Musculoskeletal:        General: Normal range of motion.     Cervical back: Normal range of motion and neck supple.  Skin:    General: Skin is warm and dry.  Neurological:     Mental Status: She is alert.     Comments: Definite LLE weakness 3+/5 and mild LUE weakness 4/5. No paresthesias.   Psychiatric:        Mood and Affect: Mood normal.        Behavior: Behavior normal.      Consultants:  Neurology  Procedures:    Data Reviewed: Results for orders placed or performed during the hospital encounter of 01/10/23 (from the past 24 hour(s))  CBG monitoring, ED     Status: Abnormal   Collection Time: 01/10/23  5:23 PM  Result Value Ref Range   Glucose-Capillary 106 (H) 70 - 99 mg/dL  Ethanol     Status: None   Collection Time: 01/10/23  6:00 PM  Result Value Ref Range   Alcohol, Ethyl (B) <10 <10 mg/dL  Protime-INR     Status: Abnormal   Collection Time: 01/10/23  6:00 PM  Result Value Ref Range   Prothrombin Time 15.3 (H) 11.4 - 15.2 seconds   INR 1.2 0.8 - 1.2  APTT     Status: None   Collection Time: 01/10/23  6:00 PM  Result Value Ref Range   aPTT 29 24 - 36 seconds  CBC     Status: Abnormal   Collection Time: 01/10/23  6:00 PM  Result Value Ref Range   WBC 5.0 4.0 - 10.5  K/uL   RBC 3.71 (L) 3.87 - 5.11 MIL/uL   Hemoglobin 10.5 (L) 12.0 - 15.0 g/dL   HCT 32.4 (L) 36.0 - 46.0 %   MCV 87.3 80.0 - 100.0 fL   MCH 28.3 26.0 - 34.0 pg   MCHC 32.4 30.0 - 36.0 g/dL   RDW 11.9 11.5 - 15.5 %   Platelets 76 (L) 150 - 400 K/uL   nRBC 0.0 0.0 - 0.2 %  Differential     Status: Abnormal   Collection Time: 01/10/23  6:00 PM  Result Value Ref Range   Neutrophils Relative % 33 %   Neutro Abs 1.6 (L) 1.7 - 7.7 K/uL   Lymphocytes Relative 39 %   Lymphs Abs 1.9 0.7 - 4.0 K/uL   Monocytes Relative 11 %   Monocytes Absolute 0.6 0.1 -  1.0 K/uL   Eosinophils Relative 16 %   Eosinophils Absolute 0.8 (H) 0.0 - 0.5 K/uL   Basophils Relative 1 %   Basophils Absolute 0.1 0.0 - 0.1 K/uL   Immature Granulocytes 0 %   Abs Immature Granulocytes 0.01 0.00 - 0.07 K/uL  Comprehensive metabolic panel     Status: Abnormal   Collection Time: 01/10/23  6:00 PM  Result Value Ref Range   Sodium 139 135 - 145 mmol/L   Potassium 3.6 3.5 - 5.1 mmol/L   Chloride 103 98 - 111 mmol/L   CO2 27 22 - 32 mmol/L   Glucose, Bld 95 70 - 99 mg/dL   BUN 12 8 - 23 mg/dL   Creatinine, Ser 0.71 0.44 - 1.00 mg/dL   Calcium 9.1 8.9 - 10.3 mg/dL   Total Protein 6.3 (L) 6.5 - 8.1 g/dL   Albumin 3.4 (L) 3.5 - 5.0 g/dL   AST 19 15 - 41 U/L   ALT 16 0 - 44 U/L   Alkaline Phosphatase 76 38 - 126 U/L   Total Bilirubin 0.7 0.3 - 1.2 mg/dL   GFR, Estimated >60 >60 mL/min   Anion gap 9 5 - 15  Urine rapid drug screen (hosp performed)     Status: Abnormal   Collection Time: 01/10/23  9:19 PM  Result Value Ref Range   Opiates NONE DETECTED NONE DETECTED   Cocaine NONE DETECTED NONE DETECTED   Benzodiazepines POSITIVE (A) NONE DETECTED   Amphetamines NONE DETECTED NONE DETECTED   Tetrahydrocannabinol NONE DETECTED NONE DETECTED   Barbiturates NONE DETECTED NONE DETECTED  Urinalysis, Routine w reflex microscopic -Urine, Clean Catch     Status: None   Collection Time: 01/10/23  9:19 PM  Result Value Ref  Range   Color, Urine YELLOW YELLOW   APPearance CLEAR CLEAR   Specific Gravity, Urine 1.012 1.005 - 1.030   pH 7.0 5.0 - 8.0   Glucose, UA NEGATIVE NEGATIVE mg/dL   Hgb urine dipstick NEGATIVE NEGATIVE   Bilirubin Urine NEGATIVE NEGATIVE   Ketones, ur NEGATIVE NEGATIVE mg/dL   Protein, ur NEGATIVE NEGATIVE mg/dL   Nitrite NEGATIVE NEGATIVE   Leukocytes,Ua NEGATIVE NEGATIVE  Hemoglobin A1c     Status: Abnormal   Collection Time: 01/11/23  6:44 AM  Result Value Ref Range   Hgb A1c MFr Bld 6.0 (H) 4.8 - 5.6 %   Mean Plasma Glucose 125.5 mg/dL  CBC     Status: Abnormal   Collection Time: 01/11/23  6:44 AM  Result Value Ref Range   WBC 4.9 4.0 - 10.5 K/uL   RBC 3.70 (L) 3.87 - 5.11 MIL/uL   Hemoglobin 10.6 (L) 12.0 - 15.0 g/dL   HCT 32.0 (L) 36.0 - 46.0 %   MCV 86.5 80.0 - 100.0 fL   MCH 28.6 26.0 - 34.0 pg   MCHC 33.1 30.0 - 36.0 g/dL   RDW 11.9 11.5 - 15.5 %   Platelets 75 (L) 150 - 400 K/uL   nRBC 0.0 0.0 - 0.2 %  Comprehensive metabolic panel     Status: Abnormal   Collection Time: 01/11/23  6:44 AM  Result Value Ref Range   Sodium 138 135 - 145 mmol/L   Potassium 3.6 3.5 - 5.1 mmol/L   Chloride 103 98 - 111 mmol/L   CO2 28 22 - 32 mmol/L   Glucose, Bld 110 (H) 70 - 99 mg/dL   BUN 13 8 - 23 mg/dL   Creatinine, Ser 0.84 0.44 -  1.00 mg/dL   Calcium 9.2 8.9 - 10.3 mg/dL   Total Protein 5.9 (L) 6.5 - 8.1 g/dL   Albumin 3.3 (L) 3.5 - 5.0 g/dL   AST 17 15 - 41 U/L   ALT 16 0 - 44 U/L   Alkaline Phosphatase 69 38 - 126 U/L   Total Bilirubin 0.6 0.3 - 1.2 mg/dL   GFR, Estimated >60 >60 mL/min   Anion gap 7 5 - 15  Lipid panel     Status: None   Collection Time: 01/11/23  6:48 AM  Result Value Ref Range   Cholesterol 126 0 - 200 mg/dL   Triglycerides 114 <150 mg/dL   HDL 42 >40 mg/dL   Total CHOL/HDL Ratio 3.0 RATIO   VLDL 23 0 - 40 mg/dL   LDL Cholesterol 61 0 - 99 mg/dL    I have reviewed pertinent nursing notes, vitals, labs, and images as necessary. I have  ordered labwork to follow up on as indicated.  I have reviewed the last notes from staff over past 24 hours. I have discussed patient's care plan and test results with nursing staff, CM/SW, and other staff as appropriate.  Time spent: Greater than 50% of the 55 minute visit was spent in counseling/coordination of care for the patient as laid out in the A&P.   LOS: 0 days   Dwyane Dee, MD [facility] 01/11/2023, 2:35 PM

## 2023-01-11 NOTE — ED Notes (Signed)
Pt assisted to BR via wheelchair, pt has trouble with left leg, educated pt regarding puwick, pt is agreeable to have an external urinary catheter placed.

## 2023-01-11 NOTE — ED Notes (Signed)
[facility] has arrived to transport pt, no change in status, pt resting, NAD noted, observed even RR and unlabored, side rails up x2 for safety, pt expresses no needs or concerns at this time, no further concerns as of present.

## 2023-01-11 NOTE — Assessment & Plan Note (Signed)
Continue PPI ?

## 2023-01-11 NOTE — ED Notes (Signed)
Receiving RN Collie Siad Suanne Marker) has agreed to accept [facility] once pt has arrived to inpatient unit, all questions and concerns address. Pt is being transfer to [facility] [id number], [facility] is not in route at this time. Willup date receiving RN once [facility] is in route

## 2023-01-11 NOTE — Progress Notes (Signed)
  Echocardiogram 2D Echocardiogram has been performed.  Patricia [name] 01/11/2023, 2:54 PM

## 2023-01-11 NOTE — Progress Notes (Signed)
  Echocardiogram 2D Echocardiogram attempted at 1150. RN states pt is feeling unwell. Would like [name] to attempt again later.  Eartha Inch 01/11/2023, 11:51 AM

## 2023-01-11 NOTE — ED Notes (Signed)
PURPLE DNR bracelet placed to pt's right wrist

## 2023-01-11 NOTE — ED Notes (Addendum)
ED TO INPATIENT HANDOFF REPORT  ED Nurse Name and Phone #: Lawernce Pitts [phone]  S Name/Age/Gender Patricia [name] 77 y.o. female Room/Bed: APA04/APA04  Code Status   Code Status: Full Code  Home/SNF/Other Home Patient oriented to: self, place, time, and situation Is this baseline? Yes   Triage Complete: Triage complete  Chief Complaint Left leg weakness [R29.898]  Triage Note Per EMS patient has been having sinus infection, inner ear problems for the past week. Patient states all of a sudden left leg weakness (per family patient could not bare weight and fell last night) and left arm movement painful starting last night 01/09/23. Patient states it feels muscle hurt. CBG 115 with EMS.   Allergies Allergies  Allergen Reactions   Codeine Nausea Only    Level of Care/Admitting Diagnosis ED Disposition     ED Disposition  Admit   Condition  --   Comment  Hospital Area: [place] [[id number]]  Level of Care: Telemetry Medical [104]  May place patient in observation at [facility] or [facility] if equivalent level of care is available:: Yes  Covid Evaluation: Asymptomatic - no recent exposure (last 10 days) testing not required  Diagnosis: Left leg weakness [098119]  Admitting Physician: Rolla Plate [[id number]]  Attending Physician: Rolla Plate [[id number]]          B Medical/Surgery History Past Medical History:  Diagnosis Date   Depression    GERD (gastroesophageal reflux disease)    Hypercholesterolemia    IFG (impaired fasting glucose)    Osteopenia    PONV (postoperative nausea and vomiting)    Past Surgical History:  Procedure Laterality Date   ABDOMINAL HYSTERECTOMY     CATARACT EXTRACTION W/PHACO  09/12/2011   Procedure: CATARACT EXTRACTION PHACO AND INTRAOCULAR LENS PLACEMENT ([place]);  Surgeon: Tonny Branch;  Location: [facility];  Service: Ophthalmology;  Laterality: Left;  CDE: 8.91   CATARACT EXTRACTION W/PHACO  11/18/2011    Procedure: CATARACT EXTRACTION PHACO AND INTRAOCULAR LENS PLACEMENT ([facility]);  Surgeon: Tonny Branch;  Location: [facility];  Service: Ophthalmology;  Laterality: Right;  CDE:10.26   COLONOSCOPY  2007   Dr. Gala Romney: internal hemorrhoids, single anal papilla poor prep.    COLONOSCOPY N/A 03/27/2017   Rourk: Normal exam.   ECTOPIC PREGNANCY SURGERY     removal of right tube   ESOPHAGOGASTRODUODENOSCOPY (EGD) WITH PROPOFOL N/A 09/10/2021   Procedure: ESOPHAGOGASTRODUODENOSCOPY (EGD) WITH PROPOFOL;  Surgeon: Daneil Dolin, MD;  Location: [facility];  Service: Endoscopy;  Laterality: N/A;  8:15AM   MALONEY DILATION N/A 09/10/2021   Procedure: Venia Minks DILATION;  Surgeon: Daneil Dolin, MD;  Location: [facility];  Service: Endoscopy;  Laterality: N/A;   OVARIAN CYST REMOVAL     right   RECTOCELE REPAIR N/A 01/18/2014   Procedure: POSTERIOR REPAIR (RECTOCELE);  Surgeon: Jonnie Kind, MD;  Location: [facility];  Service: Gynecology;  Laterality: N/A;   VAGINAL HYSTERECTOMY N/A 01/18/2014   Procedure: HYSTERECTOMY VAGINAL;  Surgeon: Jonnie Kind, MD;  Location: [facility];  Service: Gynecology;  Laterality: N/A;     A IV Location/Drains/Wounds Patient Lines/Drains/Airways Status     Active Line/Drains/Airways     Name Placement date Placement time Site Days   Peripheral IV 01/10/23 20 G Left Forearm 01/10/23  1738  Forearm  1            Intake/Output Last 24 hours No intake or output data in the 24 hours ending 01/11/23 0041  Labs/Imaging Results for orders placed or performed during the hospital encounter of 01/10/23 (from the past 48 hour(s))  CBG monitoring, ED     Status: Abnormal   Collection Time: 01/10/23  5:23 PM  Result Value Ref Range   Glucose-Capillary 106 (H) 70 - 99 mg/dL    Comment: Glucose reference range applies only to samples taken after fasting for at least 8 hours.  Ethanol     Status: None   Collection Time: 01/10/23  6:00 PM  Result Value Ref Range   Alcohol,  Ethyl (B) <10 <10 mg/dL    Comment: (NOTE) Lowest detectable limit for serum alcohol is 10 mg/dL.  For medical purposes only. Performed at [facility], [address], [place], [place] [address]   Protime-INR     Status: Abnormal   Collection Time: 01/10/23  6:00 PM  Result Value Ref Range   Prothrombin Time 15.3 (H) 11.4 - 15.2 seconds   INR 1.2 0.8 - 1.2    Comment: (NOTE) INR goal varies based on device and disease states. Performed at [facility], [address] [place], [place] [address]   APTT     Status: None   Collection Time: 01/10/23  6:00 PM  Result Value Ref Range   aPTT 29 24 - 36 seconds    Comment: Performed at [facility], [address] [place], [place] [address]  CBC     Status: Abnormal   Collection Time: 01/10/23  6:00 PM  Result Value Ref Range   WBC 5.0 4.0 - 10.5 K/uL   RBC 3.71 (L) 3.87 - 5.11 MIL/uL   Hemoglobin 10.5 (L) 12.0 - 15.0 g/dL   HCT 32.4 (L) 36.0 - 46.0 %   MCV 87.3 80.0 - 100.0 fL   MCH 28.3 26.0 - 34.0 pg   MCHC 32.4 30.0 - 36.0 g/dL   RDW 11.9 11.5 - 15.5 %   Platelets 76 (L) 150 - 400 K/uL    Comment: SPECIMEN CHECKED FOR CLOTS REPEATED TO VERIFY    nRBC 0.0 0.0 - 0.2 %    Comment: Performed at [facility], [address], [place], [place] [address]  Differential     Status: Abnormal   Collection Time: 01/10/23  6:00 PM  Result Value Ref Range   Neutrophils Relative % 33 %   Neutro Abs 1.6 (L) 1.7 - 7.7 K/uL   Lymphocytes Relative 39 %   Lymphs Abs 1.9 0.7 - 4.0 K/uL   Monocytes Relative 11 %   Monocytes Absolute 0.6 0.1 - 1.0 K/uL   Eosinophils Relative 16 %   Eosinophils Absolute 0.8 (H) 0.0 - 0.5 K/uL   Basophils Relative 1 %   Basophils Absolute 0.1 0.0 - 0.1 K/uL   Immature Granulocytes 0 %   Abs Immature Granulocytes 0.01 0.00 - 0.07 K/uL    Comment: Performed at [facility], [address] [place], [place] [address]  Comprehensive metabolic panel     Status: Abnormal   Collection Time: 01/10/23  6:00 PM  Result  Value Ref Range   Sodium 139 135 - 145 mmol/L   Potassium 3.6 3.5 - 5.1 mmol/L   Chloride 103 98 - 111 mmol/L   CO2 27 22 - 32 mmol/L   Glucose, Bld 95 70 - 99 mg/dL    Comment: Glucose reference range applies only to samples taken after fasting for at least 8 hours.   BUN 12 8 - 23 mg/dL   Creatinine, Ser 0.71 0.44 - 1.00  mg/dL   Calcium 9.1 8.9 - 10.3 mg/dL   Total Protein 6.3 (L) 6.5 - 8.1 g/dL   Albumin 3.4 (L) 3.5 - 5.0 g/dL   AST 19 15 - 41 U/L   ALT 16 0 - 44 U/L   Alkaline Phosphatase 76 38 - 126 U/L   Total Bilirubin 0.7 0.3 - 1.2 mg/dL   GFR, Estimated >60 >60 mL/min    Comment: (NOTE) Calculated using the CKD-EPI Creatinine Equation (2021)    Anion gap 9 5 - 15    Comment: Performed at [facility], [address], [place], [place] [address]  Urine rapid drug screen (hosp performed)     Status: Abnormal   Collection Time: 01/10/23  9:19 PM  Result Value Ref Range   Opiates NONE DETECTED NONE DETECTED   Cocaine NONE DETECTED NONE DETECTED   Benzodiazepines POSITIVE (A) NONE DETECTED   Amphetamines NONE DETECTED NONE DETECTED   Tetrahydrocannabinol NONE DETECTED NONE DETECTED   Barbiturates NONE DETECTED NONE DETECTED    Comment: (NOTE) DRUG SCREEN FOR MEDICAL PURPOSES ONLY.  IF CONFIRMATION IS NEEDED FOR ANY PURPOSE, NOTIFY LAB WITHIN 5 DAYS.  LOWEST DETECTABLE LIMITS FOR URINE DRUG SCREEN Drug Class                     Cutoff (ng/mL) Amphetamine and metabolites    1000 Barbiturate and metabolites    200 Benzodiazepine                 200 Opiates and metabolites        300 Cocaine and metabolites        300 THC                            50 Performed at [facility], [address], [place], [place] [address]   Urinalysis, Routine w reflex microscopic -Urine, Clean Catch     Status: None   Collection Time: 01/10/23  9:19 PM  Result Value Ref Range   Color, Urine YELLOW YELLOW   APPearance CLEAR CLEAR   Specific Gravity, Urine 1.012 1.005 - 1.030   pH 7.0  5.0 - 8.0   Glucose, UA NEGATIVE NEGATIVE mg/dL   Hgb urine dipstick NEGATIVE NEGATIVE   Bilirubin Urine NEGATIVE NEGATIVE   Ketones, ur NEGATIVE NEGATIVE mg/dL   Protein, ur NEGATIVE NEGATIVE mg/dL   Nitrite NEGATIVE NEGATIVE   Leukocytes,Ua NEGATIVE NEGATIVE    Comment: Performed at [facility], [address], [place], [place] [address]   CT HEAD WO CONTRAST  Result Date: 01/10/2023 CLINICAL DATA:  Sudden left leg weakness. Neuro deficit, acute, stroke suspected. EXAM: CT HEAD WITHOUT CONTRAST TECHNIQUE: Contiguous axial images were obtained from the base of the skull through the vertex without intravenous contrast. RADIATION DOSE REDUCTION: This exam was performed according to the departmental dose-optimization program which includes automated exposure control, adjustment of the mA and/or kV according to patient size and/or use of iterative reconstruction technique. COMPARISON:  None Available. FINDINGS: Brain: No acute intracranial hemorrhage, midline shift or mass effect. No extra-axial fluid collection. Mild atrophy is noted. Mild periventricular white matter hypodensities are present bilaterally. No hydrocephalus. Vascular: No hyperdense vessel or unexpected calcification. Skull: Normal. Negative for fracture or focal lesion. Sinuses/Orbits: No acute finding. Other: None. IMPRESSION: 1. No acute intracranial process. 2. Atrophy with chronic microvascular ischemic changes. Electronically Signed   By: Brett Fairy M.D.   On: 01/10/2023 20:04  Pending Labs [employer] (From admission, onward)     Start     Ordered   Signed and Held  Hemoglobin A1c  Tomorrow morning,   R        Signed and Held   Signed and Held  Lipid panel  Tomorrow morning,   R        Signed and Held   Signed and Held  Lipid panel  (Labs)  Tomorrow morning,   R       Comments: Fasting    Signed and Held   Signed and Held  Hemoglobin A1c  (Labs)  Tomorrow morning,   R       Comments: To assess prior glycemic  control    Signed and Held   Signed and Held  CBC  (Labs)  Tomorrow morning,   R        Signed and Held   Signed and Held  Comprehensive metabolic panel  (Labs)  Tomorrow morning,   R        Signed and Held            Vitals/Pain Today's Vitals   01/10/23 2230 01/10/23 2300 01/11/23 0000 01/11/23 0030  BP: 97/62 113/78 (!) 116/103 114/88  Pulse: 73 69 74 79  Resp: (!) 21 (!) 21 17 19   Temp:      TempSrc:      SpO2: 95% 94% 95% 96%  Weight:      Height:      PainSc:        Isolation Precautions No active isolations  Medications Medications  aspirin chewable tablet 81 mg (81 mg Oral Given 01/11/23 0027)    Mobility walks with person assist     Focused Assessments Neuro Assessment Handoff:  Swallow screen pass? Yes    NIH Stroke Scale  Dizziness Present: No Headache Present: No Interval: Shift assessment Level of Consciousness (1a.)   : Alert, keenly responsive LOC Questions (1b. )   : Answers both questions correctly LOC Commands (1c. )   : Performs both tasks correctly Best Gaze (2. )  : Normal Visual (3. )  : No visual loss Facial Palsy (4. )    : Normal symmetrical movements Motor Arm, Left (5a. )   : No drift Motor Arm, Right (5b. ) : No drift Motor Leg, Left (6a. )  : No drift Motor Leg, Right (6b. ) : No drift Limb Ataxia (7. ): Absent Sensory (8. )  : Normal, no sensory loss Best Language (9. )  : No aphasia Dysarthria (10. ): Normal Extinction/Inattention (11.)   : No Abnormality Complete NIHSS TOTAL: 0 Last date known well: 01/09/23   Neuro Assessment: Within Defined Limits Neuro Checks:   Initial (01/10/23 1830)  Has TPA been given? No If patient is a Neuro Trauma and patient is going to OR before floor call report to [facility] nurse: [phone] or [phone]   R Recommendations: See Admitting Provider Note  Report given to:   Additional Notes: Pt needs MRI, left leg has full mobility, pt c/o heaviness ot left leg and tingling  sensation. Pt speaks in a soft whisper at baseline d/t metastasis lung cancer that is in her throat.

## 2023-01-11 NOTE — Hospital Course (Addendum)
Patricia [name] is a 77 yo female with PMH Stage IV NSCLC adenocarcinoma (extensive lymphadenopathy involving the neck, left hilar and mediastinal lymphadenopathy as well as upper abdomen lymph nodes and multiple subcutaneous/muscular nodules diagnosed in January 2024) on palliative systemic chemo, depression, GERD, HLD.  She presented with acute onset of left lower extremity weakness notably dragging her left leg starting on approximately Thursday.  She was recently seen approximately 1 week ago for new onset of headache and had no focal neurodeficits at that time. She was admitted for stroke workup.  MRI brain was performed which showed small scattered acute and subacute infarcts in the bilateral cerebral hemisphere.

## 2023-01-11 NOTE — Assessment & Plan Note (Addendum)
-   Presented with left lower extremity weakness onset approximately Thursday.  Admitted for stroke workup - MRI brain revealing small scattered acute and subacute infarcts in the bilateral cerebral hemisphere; enhancing nodule in the right frontal cortex suggesting subacute infarct but recommended for follow-up 6 to 8 weeks given her underlying malignancy - neurology consulted - LDL 61. A1c 6% -Continue aspirin and Plavix for now - PT/OT recommending CIR - follow up SLP eval - follow up echo - suspect may need Zio/cardiac monitor at discharge if echo negative given acute/subacute B/L infarcts -Follow-up further neurology recommendations

## 2023-01-11 NOTE — ED Notes (Signed)
Secure message sent to receiving RN Nelda Marseille to advised pt is headed her way from hospital to hospital transfer via [place]

## 2023-01-11 NOTE — Assessment & Plan Note (Signed)
-   Chronic, due to cancer and oncologic treatments

## 2023-01-11 NOTE — Evaluation (Signed)
Physical Therapy Evaluation Patient Details Name: Patricia [name] MRN: [id number] DOB: 05-15-1946 Today's Date: 01/11/2023  History of Present Illness  77 y.o. female presents the ED 01/10/23 with a chief complaint of left leg weakness. Also with recent vertigo and fall due to vertigo. MRI  Small scattered acute and subacute infarcts in the bilateral cerebral hemisphere suggesting central embolic disease. Enhancing nodule in the right frontal cortex is most likely a subacute infarct  but recommend follow-up in 6-8 weeks given active malignancy.  PMH significant of depression, GERD, hyperlipidemia, non-small cell lung cancer with last treatment being January 22,  Clinical Impression   Pt admitted secondary to problem above with deficits below. PTA patient was living with spouse in home with 4 steps to enter. She was working full-time and worked out 4 days/week.  Pt currently requires mod assist for bed mobility and +2 mod assist for sit to stand transfers. She is not able to advance LLE for stepping. She has a supportive husband and feel she can progress to home with husband with AIR level of therapies. Anticipate patient will benefit from PT to address problems listed below.Will continue to follow acutely to maximize functional mobility independence and safety.          Recommendations for follow up therapy are one component of a multi-disciplinary discharge planning process, led by the attending physician.  Recommendations may be updated based on patient status, additional functional criteria and insurance authorization.  Follow Up Recommendations Acute inpatient rehab (3hours/day)      Assistance Recommended at Discharge Frequent or constant Supervision/Assistance  Patient can return home with the following  Two people to help with walking and/or transfers;Assistance with cooking/housework;Direct supervision/assist for medications management;Direct supervision/assist for financial  management;Assist for transportation;Help with stairs or ramp for entrance    Equipment Recommendations Wheelchair (measurements PT);Wheelchair cushion (measurements PT)  Recommendations for Other Services  Rehab consult    Functional Status Assessment Patient has had a recent decline in their functional status and demonstrates the ability to make significant improvements in function in a reasonable and predictable amount of time.     Precautions / Restrictions Precautions Precautions: Fall      Mobility  Bed Mobility Overal bed mobility: Needs Assistance Bed Mobility: Rolling, Sidelying to Sit, Sit to Sidelying Rolling: Min assist Sidelying to sit: Mod assist     Sit to sidelying: Mod assist General bed mobility comments: roll to left, left side to sit with assist and cues for sequencing; assist to move LLE over EoB and to raise torso; return to bed with assist to raise LLE    Transfers Overall transfer level: Needs assistance Equipment used: None Transfers: Sit to/from Stand Sit to Stand: Mod assist, +2 physical assistance           General transfer comment: +lean to left with 2 person assist to correct to midline; stood x2 for pericare    Ambulation/Gait             Pre-gait activities: assisted wt-shift to rt with pt still unable to advance LLE    Stairs            Wheelchair Mobility    Modified Rankin (Stroke Patients Only) Modified Rankin (Stroke Patients Only) Pre-Morbid Rankin Score: No symptoms Modified Rankin: Severe disability     Balance Overall balance assessment: Needs assistance Sitting-balance support: Single extremity supported, Feet supported Sitting balance-Leahy Scale: Poor Sitting balance - Comments: left lean even with RUE support   Standing balance  support: Bilateral upper extremity supported, Reliant on assistive device for balance Standing balance-Leahy Scale: Poor                                Pertinent Vitals/Pain Pain Assessment Pain Assessment: No/denies pain    Home Living Family/patient expects to be discharged to:: Private residence Living Arrangements: Spouse/significant other Available Help at Discharge: Family Type of Home: House Home Access: Stairs to enter Entrance Stairs-Rails: Psychiatric nurse of Steps: 4   Home Layout: One level Home Equipment: None      Prior Function Prior Level of Function : Independent/Modified Independent             Mobility Comments: working out 4-5 days/week; working 10 hr days       Hand Dominance   Dominant Hand: Right    Extremity/Trunk Assessment   Upper Extremity Assessment Upper Extremity Assessment: Defer to OT evaluation    Lower Extremity Assessment Lower Extremity Assessment: LLE deficits/detail LLE Deficits / Details: hip flexion 2+, hip extension 2+; knee extension 2+, ankle DF 2+ LLE Sensation: WNL (pt denies numbness)    Cervical / Trunk Assessment Cervical / Trunk Assessment: Other exceptions Cervical / Trunk Exceptions: trunk left leaning in sit and stand  Communication   Communication: Expressive difficulties (very low, hoarse voice)  Cognition Arousal/Alertness: Awake/alert Behavior During Therapy: WFL for tasks assessed/performed Overall Cognitive Status: Impaired/Different from baseline Area of Impairment: Attention, Safety/judgement, Awareness, Problem solving                   Current Attention Level: Selective     Safety/Judgement: Decreased awareness of safety, Decreased awareness of deficits Awareness: Intellectual Problem Solving: Decreased initiation, Requires verbal cues, Requires tactile cues General Comments: decr awareness of left leaning in sitting and requires assist to correct/maintain midline        General Comments      Exercises     Assessment/Plan    PT Assessment Patient needs continued PT services  PT Problem List Decreased  strength;Decreased activity tolerance;Decreased balance;Decreased mobility;Decreased cognition;Decreased knowledge of use of DME;Decreased safety awareness       PT Treatment Interventions DME instruction;Gait training;Stair training;Functional mobility training;Therapeutic activities;Balance training;Neuromuscular re-education;Cognitive remediation;Patient/family education    PT Goals (Current goals can be found in the Care Plan section)  Acute Rehab PT Goals Patient Stated Goal: get stronger PT Goal Formulation: With patient Time For Goal Achievement: 01/25/23 Potential to Achieve Goals: Good    Frequency Min 3X/week     Co-evaluation PT/OT/SLP Co-Evaluation/Treatment: Yes Reason for Co-Treatment: Complexity of the patient's impairments (multi-system involvement);To address functional/ADL transfers;For patient/therapist safety PT goals addressed during session: Mobility/safety with mobility;Balance         AM-PAC PT "6 Clicks" Mobility  Outcome Measure Help needed turning from your back to your side while in a flat bed without using bedrails?: A Little Help needed moving from lying on your back to sitting on the side of a flat bed without using bedrails?: A Lot Help needed moving to and from a bed to a chair (including a wheelchair)?: Total Help needed standing up from a chair using your arms (e.g., wheelchair or bedside chair)?: Total Help needed to walk in hospital room?: Total Help needed climbing 3-5 steps with a railing? : Total 6 Click Score: 9    End of Session Equipment Utilized During Treatment: Gait belt Activity Tolerance: Patient tolerated treatment well Patient left: in  bed;with call bell/phone within reach;with bed alarm set;with family/visitor present   PT Visit Diagnosis: Hemiplegia and hemiparesis Hemiplegia - Right/Left: Left Hemiplegia - dominant/non-dominant: Non-dominant Hemiplegia - caused by: Cerebral infarction    Time: 1230-1301 PT Time  Calculation (min) (ACUTE ONLY): 31 min   Charges:   PT Evaluation $PT Eval Low Complexity: [place], PT Acute Rehabilitation Services  Office [phone]   Rexanne Mano 01/11/2023, 2:03 PM

## 2023-01-11 NOTE — Consult Note (Signed)
Neurology Consultation  Reason for Consult: Acute embolic strokes Referring Physician: Dr. Sabino Gasser  CC: Left leg weakness  History is obtained from: Patient and chart  HPI: Patricia [name] is a 77 y.o. female with history of depression, GERD, hyperlipidemia and non-small cell lung cancer who presents with left leg weakness.  On 2/1, she initially noticed some vertigo and right ear pain.  She fell due to the vertigo while going to the bathroom and hit her head.  In the morning of 2/2, she noticed left leg weakness which made it difficult for her to ambulate and came into the hospital for evaluation.  MRI brain demonstrated small acute infarcts in bilateral hemispheres and the right frontal lobe, ACA distribution.   LKW: 2/2 AM TNK given?: no, outside of window IR Thrombectomy? No, no LVO Modified Rankin Scale: 1-No significant post stroke disability and can perform usual duties with stroke symptoms  ROS: A complete ROS was performed and is negative except as noted in the HPI. Past Medical History:  Diagnosis Date   Depression    GERD (gastroesophageal reflux disease)    Hypercholesterolemia    IFG (impaired fasting glucose)    Osteopenia    PONV (postoperative nausea and vomiting)      Family History  Problem Relation Age of Onset   Heart attack Father    Osteoporosis Mother    Heart attack Brother 53       MI   Breast cancer Maternal Aunt    Breast cancer Paternal Aunt    Cancer Paternal Aunt        ovarian cancer   Anesthesia problems Neg Hx    Hypotension Neg Hx    Malignant hyperthermia Neg Hx    Pseudochol deficiency Neg Hx    Colon cancer Neg Hx      Social History:   reports that she quit smoking about 38 years ago. Her smoking use included cigarettes. She has a 20.00 pack-year smoking history. She has never used smokeless tobacco. She reports that she does not drink alcohol and does not use drugs.  Medications  Current Facility-Administered Medications:     [START ON 01/12/2023]  stroke: early stages of recovery book, , Does not apply, Once, Zierle-Ghosh, Asia B, DO   acetaminophen (TYLENOL) tablet 650 mg, 650 mg, Oral, Q4H PRN, 650 mg at 01/11/23 1104 **OR** acetaminophen (TYLENOL) 160 MG/5ML solution 650 mg, 650 mg, Per Tube, Q4H PRN **OR** acetaminophen (TYLENOL) suppository 650 mg, 650 mg, Rectal, Q4H PRN, Zierle-Ghosh, Asia B, DO   acetaminophen (TYLENOL) tablet 650 mg, 650 mg, Oral, Q6H PRN, Kristopher Oppenheim, DO   ALPRAZolam Duanne Moron) tablet 0.5 mg, 0.5 mg, Oral, QHS PRN, Zierle-Ghosh, Asia B, DO   aspirin EC tablet 81 mg, 81 mg, Oral, Daily, Zierle-Ghosh, Asia B, DO, 81 mg at 01/11/23 1104   clopidogrel (PLAVIX) tablet 75 mg, 75 mg, Oral, Daily, de Yolanda Manges, [place] E, NP, 75 mg at 01/11/23 1452   diphenhydrAMINE (BENADRYL) injection 12.5 mg, 12.5 mg, Intravenous, Q6H PRN **AND** metoCLOPramide (REGLAN) injection 10 mg, 10 mg, Intravenous, Q6H PRN, Dwyane Dee, MD   folic acid (FOLVITE) tablet 1 mg, 1 mg, Oral, Daily, Zierle-Ghosh, Asia B, DO, 1 mg at 01/11/23 1104   heparin injection 5,000 Units, 5,000 Units, Subcutaneous, Q8H, Zierle-Ghosh, Asia B, DO, 5,000 Units at 01/11/23 1452   melatonin tablet 6 mg, 6 mg, Oral, QHS PRN, Zierle-Ghosh, Asia B, DO   multivitamin with minerals tablet 1 tablet, 1 tablet, Oral, Daily, Zierle-Ghosh,  Asia B, DO, 1 tablet at 01/11/23 1104   ondansetron (ZOFRAN) injection 4 mg, 4 mg, Intravenous, Q6H PRN, Zierle-Ghosh, Asia B, DO   pantoprazole (PROTONIX) EC tablet 20 mg, 20 mg, Oral, Daily, Zierle-Ghosh, Asia B, DO, 20 mg at 01/11/23 1104   PARoxetine (PAXIL) tablet 40 mg, 40 mg, Oral, q AM, Zierle-Ghosh, Asia B, DO, 40 mg at 01/11/23 4967   perflutren lipid microspheres (DEFINITY) IV suspension, 1-10 mL, Intravenous, PRN, Dwyane Dee, MD, 2 mL at 01/11/23 1453   rosuvastatin (CRESTOR) tablet 20 mg, 20 mg, Oral, Daily, Zierle-Ghosh, Asia B, DO, 20 mg at 01/11/23 1104   senna-docusate (Senokot-S) tablet 1 tablet, 1  tablet, Oral, QHS PRN, Zierle-Ghosh, Asia B, DO   Exam: Current vital signs: BP 111/68 (BP Location: Left Arm)   Pulse 69   Temp 97.7 F (36.5 C) (Oral)   Resp 18   Ht 4\' 10"  (1.473 m)   Wt 63 kg   SpO2 96%   BMI 29.05 kg/m  Vital signs in last 24 hours: Temp:  [97.7 F (36.5 C)-98.9 F (37.2 C)] 97.7 F (36.5 C) (02/03 1521) Pulse Rate:  [66-86] 69 (02/03 1521) Resp:  [16-27] 18 (02/03 1521) BP: (93-134)/(56-103) 111/68 (02/03 1521) SpO2:  [94 %-98 %] 96 % (02/03 1521) Weight:  [63 kg] 63 kg (02/02 1729)  GENERAL: Awake, alert, in no acute distress Psych: Affect appropriate for situation, patient is calm and cooperative with examination Head: Normocephalic and atraumatic, without obvious abnormality EENT: Normal conjunctivae, dry mucous membranes, no OP obstruction LUNGS: Normal respiratory effort. Non-labored breathing on room air Extremities: warm, well perfused, without obvious deformity  NEURO:  Mental Status: Awake, alert, and oriented to person, place, time, and situation. She is able to provide a clear and coherent history of present illness. Speech/Language: speech is whispering but clear and fluent.   No neglect is noted Cranial Nerves:  II: PERRL visual fields full.  III, IV, VI: EOMI. Lid elevation symmetric and full.  V: Sensation is intact to light touch and symmetrical to face.  VII: Face is symmetric resting and smiling.  VIII: Hearing intact to voice IX, X: Voice is hoarse and whispery XI: Normal sternocleidomastoid and trapezius muscle strength XII: Tongue protrudes midline without fasciculations.   Motor: 5/5 strength to right upper extremity and right lower extremity, 4+/5 strength in left upper extremity and 3/5 strength in left lower extremity Tone is normal. Bulk is normal.  Sensation: Intact to light touch bilaterally in all four extremities. No extinction to DSS present.  Coordination: FTN intact bilaterally.  Gait: Deferred  NIHSS: 1a  Level of Conscious.: 0 1b LOC Questions: 0 1c LOC Commands: 0 2 Best Gaze: 0 3 Visual: 0 4 Facial Palsy: 0 5a Motor Arm - left: 0 5b Motor Arm - Right: 0 6a Motor Leg - Left: 1 6b Motor Leg - Right: 0 7 Limb Ataxia: 0 8 Sensory: 0 9 Best Language: 0 10 Dysarthria: 0 11 Extinct. and Inatten.: 0 TOTAL: 1   Labs I have reviewed labs in epic and the results pertinent to this consultation are:   CBC    Component Value Date/Time   WBC 4.9 01/11/2023 0644   RBC 3.70 (L) 01/11/2023 0644   HGB 10.6 (L) 01/11/2023 0644   HGB 13.2 01/03/2023 1142   HGB 13.3 08/02/2022 1112   HCT 32.0 (L) 01/11/2023 0644   HCT 41.0 08/02/2022 1112   PLT 75 (L) 01/11/2023 0644   PLT 223 01/03/2023 1142  PLT 291 08/02/2022 1112   MCV 86.5 01/11/2023 0644   MCV 87 08/02/2022 1112   MCH 28.6 01/11/2023 0644   MCHC 33.1 01/11/2023 0644   RDW 11.9 01/11/2023 0644   RDW 12.1 08/02/2022 1112   LYMPHSABS 1.9 01/10/2023 1800   LYMPHSABS 2.4 08/02/2022 1112   MONOABS 0.6 01/10/2023 1800   EOSABS 0.8 (H) 01/10/2023 1800   EOSABS 0.3 08/02/2022 1112   BASOSABS 0.1 01/10/2023 1800   BASOSABS 0.2 08/02/2022 1112    CMP     Component Value Date/Time   NA 138 01/11/2023 0644   NA 139 08/02/2022 1112   K 3.6 01/11/2023 0644   CL 103 01/11/2023 0644   CO2 28 01/11/2023 0644   GLUCOSE 110 (H) 01/11/2023 0644   BUN 13 01/11/2023 0644   BUN 16 08/02/2022 1112   CREATININE 0.84 01/11/2023 0644   CREATININE 0.76 01/03/2023 1142   CALCIUM 9.2 01/11/2023 0644   PROT 5.9 (L) 01/11/2023 0644   PROT 7.0 08/02/2022 1112   ALBUMIN 3.3 (L) 01/11/2023 0644   ALBUMIN 4.8 08/02/2022 1112   AST 17 01/11/2023 0644   AST 16 01/03/2023 1142   ALT 16 01/11/2023 0644   ALT 23 01/03/2023 1142   ALKPHOS 69 01/11/2023 0644   BILITOT 0.6 01/11/2023 0644   BILITOT 0.6 01/03/2023 1142   GFRNONAA >60 01/11/2023 0644   GFRNONAA >60 01/03/2023 1142   GFRAA >90 01/19/2014 0530    Lipid Panel     Component Value  Date/Time   CHOL 126 01/11/2023 0648   CHOL 198 08/02/2022 1112   TRIG 114 01/11/2023 0648   HDL 42 01/11/2023 0648   HDL 83 08/02/2022 1112   CHOLHDL 3.0 01/11/2023 0648   VLDL 23 01/11/2023 0648   LDLCALC 61 01/11/2023 0648   LDLCALC 101 (H) 08/02/2022 1112     Imaging I have reviewed the images obtained:  CT-scan of the brain: No acute abnormality  MRI examination of the brain: small embolic appearing infarcts in bilateral hemispheres and right frontal cortex  Assessment: 77 year old patient with history of depression, GERD, hyperlipidemia and small cell lung cancer presents with acute onset left leg weakness.  She also has noted weakness of grip strength on the left.  MRI shows embolic appearing strokes in bilateral hemispheres and right frontal cortex.  Patient is outside of the window for TNK and has no LVO.  Will obtain full stroke workup and consider loop recorder 30-day cardiac monitor to detect hidden A-fib.  Hypercoagulability due to cancer may also be the cause of these embolic strokes, and may need to consult with patient's oncologist to discuss anticoagulation with warfarin versus Lovenox.  Impression: Embolic acute ischemic strokes in patient with cancer  Recommendations: Stroke/TIA Workup  - Admit for stroke workup - Permissive HTN x48 hrs from sx onset goal BP <220/110. PRN labetalol or hydralazine if BP above these parameters. Avoid oral antihypertensives. - Check A1c and LDL + add statin per guidelines - aspirin 81 mg daily and clopidogrel 75 mg daily antiplt/anticoag.  Stroke team to discuss possible anticoagulation with patient's oncologist in the morning. - q4 hr neuro checks - STAT head CT for any change in neuro exam - Tele - PT/OT/SLP - Stroke education - Amb referral to neurology upon discharge    Pt seen by NP/Neuro and later by MD. Note/plan to be edited by MD as needed.  [place] , MSN, AGACNP-BC [facility] See Amion for  schedule and pager  information 01/11/2023 4:52 PM

## 2023-01-11 NOTE — Progress Notes (Signed)
Inpatient Rehab Admissions Coordinator:   Per therapy recommendations,  patient was screened for CIR candidacy by Clemens Catholic, MS, CCC-SLP. At this time, Pt. Appears to be a a potential candidate for CIR. I will place   order for rehab consult per protocol for full assessment. Note, pt. With recent cancer dx, will need to determine plans for oncologic treatment before CIR admit can be on the table. Please contact me any with questions.  Clemens Catholic, [place], [place] Admissions Coordinator  [phone] ([place]) [phone] (office)

## 2023-01-11 NOTE — Assessment & Plan Note (Addendum)
-   Continue Crestor - LDL 61

## 2023-01-12 DIAGNOSIS — I639 Cerebral infarction, unspecified: Secondary | ICD-10-CM | POA: Diagnosis not present

## 2023-01-12 MED ORDER — CALCIUM CARBONATE ANTACID 500 MG PO CHEW
2.0000 | CHEWABLE_TABLET | Freq: Three times a day (TID) | ORAL | Status: DC | PRN
  Administered 2023-01-12 – 2023-01-16 (×6): 400 mg via ORAL
  Filled 2023-01-12 (×7): qty 2

## 2023-01-12 MED ORDER — BUTALBITAL-APAP-CAFFEINE 50-325-40 MG PO TABS
2.0000 | ORAL_TABLET | Freq: Four times a day (QID) | ORAL | Status: DC | PRN
  Administered 2023-01-12 – 2023-01-17 (×15): 2 via ORAL
  Filled 2023-01-12 (×15): qty 2

## 2023-01-12 NOTE — Plan of Care (Signed)

## 2023-01-12 NOTE — Progress Notes (Signed)
Physical Therapy Treatment Patient Details Name: JADAH BOBAK MRN: [id number] DOB: 1946-06-24 Today's Date: 01/12/2023   History of Present Illness 77 y.o. female presents the ED 01/10/23 with a chief complaint of left leg weakness. Also with recent vertigo and fall due to vertigo. MRI  Small scattered acute and subacute infarcts in the bilateral cerebral hemisphere suggesting central embolic disease. Enhancing nodule in the right frontal cortex is most likely a subacute infarct  but recommend follow-up in 6-8 weeks given active malignancy.  PMH significant of depression, GERD, hyperlipidemia, non-small cell lung cancer with last treatment being January 22,    PT Comments    Patient with less left lean in sitting and standing, however remains with decr awareness and requires cues and occasional assist to correct. Able to transfer with use of stedy to/from [facility] and recliner with +1 assist. Up in recliner at end of session.     Recommendations for follow up therapy are one component of a multi-disciplinary discharge planning process, led by the attending physician.  Recommendations may be updated based on patient status, additional functional criteria and insurance authorization.  Follow Up Recommendations  Acute inpatient rehab (3hours/day)     Assistance Recommended at Discharge Frequent or constant Supervision/Assistance  Patient can return home with the following Two people to help with walking and/or transfers;Assistance with cooking/housework;Direct supervision/assist for medications management;Direct supervision/assist for financial management;Assist for transportation;Help with stairs or ramp for entrance   Equipment Recommendations  Wheelchair (measurements PT);Wheelchair cushion (measurements PT)    Recommendations for Other Services       Precautions / Restrictions Precautions Precautions: Fall     Mobility  Bed Mobility Overal bed mobility: Needs Assistance Bed Mobility:  Rolling, Sidelying to Sit Rolling: Supervision Sidelying to sit: Min assist, HOB elevated       General bed mobility comments: roll to left, left side to sit with assist and cues for sequencing; assist to move LLE over EoB and to raise torso;    Transfers Overall transfer level: Needs assistance Equipment used: None Transfers: Sit to/from Stand, Bed to chair/wheelchair/BSC Sit to Stand: Min assist, +2 safety/equipment           General transfer comment: +lean to left with assist to correct to midline; stood x4 Transfer via Lift Equipment: Stedy  Ambulation/Gait                   Stairs             Wheelchair Mobility    Modified Rankin (Stroke Patients Only) Modified Rankin (Stroke Patients Only) Pre-Morbid Rankin Score: No symptoms Modified Rankin: Severe disability     Balance Overall balance assessment: Needs assistance Sitting-balance support: Single extremity supported, Feet supported Sitting balance-Leahy Scale: Poor Sitting balance - Comments: left lean even with RUE support   Standing balance support: Bilateral upper extremity supported, Reliant on assistive device for balance Standing balance-Leahy Scale: Poor                              Cognition Arousal/Alertness: Awake/alert Behavior During Therapy: WFL for tasks assessed/performed Overall Cognitive Status: Impaired/Different from baseline Area of Impairment: Attention, Safety/judgement, Awareness, Problem solving                   Current Attention Level: Selective     Safety/Judgement: Decreased awareness of safety, Decreased awareness of deficits Awareness: Intellectual Problem Solving: Decreased initiation, Requires verbal cues, Requires tactile  cues General Comments: decr awareness of left leaning in sitting and requires assist to correct/maintain midline        Exercises      General Comments General comments (skin integrity, edema, etc.): VSS per  monitor      Pertinent Vitals/Pain Pain Assessment Pain Assessment: Faces Faces Pain Scale: Hurts even more Pain Location: left calf Pain Descriptors / Indicators: Aching Pain Intervention(s): Limited activity within patient's tolerance, Monitored during session    Home Living                          Prior Function            PT Goals (current goals can now be found in the care plan section) Acute Rehab PT Goals Patient Stated Goal: get stronger Time For Goal Achievement: 01/25/23 Potential to Achieve Goals: Good Progress towards PT goals: Progressing toward goals    Frequency    Min 3X/week      PT Plan Current plan remains appropriate    Co-evaluation              AM-PAC PT "6 Clicks" Mobility   Outcome Measure  Help needed turning from your back to your side while in a flat bed without using bedrails?: A Little Help needed moving from lying on your back to sitting on the side of a flat bed without using bedrails?: A Lot Help needed moving to and from a bed to a chair (including a wheelchair)?: A Lot Help needed standing up from a chair using your arms (e.g., wheelchair or bedside chair)?: A Lot Help needed to walk in hospital room?: Total Help needed climbing 3-5 steps with a railing? : Total 6 Click Score: 11    End of Session Equipment Utilized During Treatment: Gait belt Activity Tolerance: Patient tolerated treatment well Patient left: with call bell/phone within reach;in chair;with chair alarm set Nurse Communication: Mobility status;Need for lift equipment PT Visit Diagnosis: Hemiplegia and hemiparesis Hemiplegia - Right/Left: Left Hemiplegia - dominant/non-dominant: Non-dominant Hemiplegia - caused by: Cerebral infarction     Time: 0927-0948 PT Time Calculation (min) (ACUTE ONLY): 21 min  Charges:  $Therapeutic Activity: 8-22 mins                      Arby Barrette, PT Acute Rehabilitation Services  Office  [phone]    Rexanne Mano 01/12/2023, 9:59 AM

## 2023-01-12 NOTE — Progress Notes (Signed)
Progress Note    Patricia [name]   DUK:[id number]  DOB: [birth year]  DOA: 01/10/2023     1 PCP: Curt Bears, MD  Initial CC: LLE weakness  Hospital Course: Patricia [name] is a 77 yo female with PMH Stage IV NSCLC adenocarcinoma (extensive lymphadenopathy involving the neck, left hilar and mediastinal lymphadenopathy as well as upper abdomen lymph nodes and multiple subcutaneous/muscular nodules diagnosed in January 2024) on palliative systemic chemo, depression, GERD, HLD.  She presented with acute onset of left lower extremity weakness notably dragging her left leg starting on approximately Thursday.  She was recently seen approximately 1 week ago for new onset of headache and had no focal neurodeficits at that time. She was admitted for stroke workup.  MRI brain was performed which showed small scattered acute and subacute infarcts in the bilateral cerebral hemisphere.  Interval History:  No events overnight.  No change in neurodeficits.  Understands plan for CIR evaluation.  Otherwise doing okay when seen this morning.  Assessment and Plan: * Acute CVA (cerebrovascular accident) [facility]) - Presented with left lower extremity weakness onset approximately Thursday.  Admitted for stroke workup - MRI brain revealing small scattered acute and subacute infarcts in the bilateral cerebral hemisphere; enhancing nodule in the right frontal cortex suggesting subacute infarct but recommended for follow-up 6 to 8 weeks given her underlying malignancy - neurology consulted - LDL 61 (on Crestor). A1c 6% -Continue aspirin and Plavix for now - PT/OT recommending CIR - evaluated by SLP. Continue dysphagia 3 diet -Stroke workup complete.  Neurology referral at discharge with repeat MRI brain [place] contrast in 6-8 weeks to monitor for any metastatic disease - continue CIR workup -Follow-up oncology/neurology discussion regarding anticoagulation versus antiplatelet therapy  Primary hypertension - Holding  valsartan and hydrochlorothiazide secondary to permissive hypertension; but BP also low as is  Hoarseness - Chronic, due to cancer and oncologic treatments  Adenocarcinoma of left lung, stage 4 ([place]) - follows with Dr. Julien Nordmann; diagnosed Jan 2024 - Stage IV (T1c, N3, M1 C) non-small cell lung cancer, adenocarcinoma presented with left upper lobe metastatic neoplasm presented with left lower lobe lung nodule in addition to extensive lymphadenopathy involving the neck, left hilar and mediastinal lymphadenopathy as well as upper abdomen lymph nodes and multiple subcutaneous/muscular nodules diagnosed in January 2024.   Anxiety and depression - Continue Xanax and Paxil - UDS shows benzos which is expected with patient having prescription for Xanax  GERD (gastroesophageal reflux disease) - Continue PPI  HLD (hyperlipidemia) - Continue Crestor - LDL 60   Old records reviewed in assessment of this patient  Antimicrobials:   DVT prophylaxis:  heparin injection 5,000 Units Start: 01/11/23 0600 SCD's Start: 01/11/23 0446   Code Status:   Code Status: DNR  Mobility Assessment (last 72 hours)     Mobility Assessment     Row Name 01/12/23 0951 01/12/23 0930 01/11/23 2000 01/11/23 1700 01/11/23 1233   Does patient have an order for bedrest or is patient medically unstable -- No - Continue assessment No - Continue assessment -- --   What is the highest level of mobility based on the progressive mobility assessment? Level 3 (Stands with assist) - Balance while standing  and cannot march in place Level 3 (Stands with assist) - Balance while standing  and cannot march in place Level 3 (Stands with assist) - Balance while standing  and cannot march in place Level 3 (Stands with assist) - Balance while standing  and cannot march in place  Level 3 (Stands with assist) - Balance while standing  and cannot march in place    Row Name 01/11/23 0930 01/11/23 0403         Does patient have an order  for bedrest or is patient medically unstable No - Continue assessment No - Continue assessment      What is the highest level of mobility based on the progressive mobility assessment? Level 3 (Stands with assist) - Balance while standing  and cannot march in place Level 3 (Stands with assist) - Balance while standing  and cannot march in place               Barriers to discharge:  Disposition Plan:  CIR Status is: Inpatient  Objective: Blood pressure 117/62, pulse 73, temperature 98.5 F (36.9 C), temperature source Oral, resp. rate 20, height 4\' 10"  (1.473 m), weight 63 kg, SpO2 94 %.  Examination:  Physical Exam Constitutional:      General: She is not in acute distress.    Appearance: Normal appearance.     Comments: Whispering voice (baseline)  HENT:     Head: Normocephalic and atraumatic.     Mouth/Throat:     Mouth: Mucous membranes are moist.  Eyes:     Extraocular Movements: Extraocular movements intact.  Cardiovascular:     Rate and Rhythm: Normal rate and regular rhythm.  Pulmonary:     Effort: No respiratory distress.     Breath sounds: Normal breath sounds. No wheezing.  Abdominal:     General: Bowel sounds are normal. There is no distension.     Palpations: Abdomen is soft.     Tenderness: There is no abdominal tenderness.  Musculoskeletal:        General: Normal range of motion.     Cervical back: Normal range of motion and neck supple.  Skin:    General: Skin is warm and dry.  Neurological:     Mental Status: She is alert.     Comments: Definite LLE weakness 3+/5 and mild LUE weakness 4/5. No paresthesias.   Psychiatric:        Mood and Affect: Mood normal.        Behavior: Behavior normal.      Consultants:  Neurology  Procedures:    Data Reviewed: No results found for this or any previous visit (from the past 24 hour(s)).   I have reviewed pertinent nursing notes, vitals, labs, and images as necessary. I have ordered labwork to follow up  on as indicated.  I have reviewed the last notes from staff over past 24 hours. I have discussed patient's care plan and test results with nursing staff, CM/SW, and other staff as appropriate.  Time spent: Greater than 50% of the 55 minute visit was spent in counseling/coordination of care for the patient as laid out in the A&P.   LOS: 1 day   Dwyane Dee, MD [facility] 01/12/2023, 12:49 PM

## 2023-01-12 NOTE — Progress Notes (Addendum)
STROKE TEAM PROGRESS NOTE   INTERVAL HISTORY Her granddaughter is at the bedside.  Assessment and plan of care reviewed with MD at bedside.  Patient presented to the ED 2/2 with acute onset of left lower extremity weakness that started on Thursday.  She is also complained of a headache for the past week or so.  She was admitted for stroke workup.  MRI brain showed small scattered acute and subacute infarcts in the bilateral cerebral hemispheres and right frontal lobe, ACA distribution.  Patient also has a recent diagnosis of stage IV NSCLC adenocarcinoma with extensive lymphadenopathy, having only been diagnosed in January.  She is currently undergoing chemotherapy with her second session due 2/15.  Vitals:   01/11/23 2024 01/12/23 0014 01/12/23 0325 01/12/23 0742  BP: (!) 85/55 103/64 (!) 97/54 117/62  Pulse: 76 69 74 73  Resp: 20 18 16 20   Temp: 98.6 F (37 C) 98 F (36.7 C) 98.4 F (36.9 C) 98.5 F (36.9 C)  TempSrc: Oral Oral Oral Oral  SpO2: 93% 95% 92% 94%  Weight:      Height:       CBC:  Recent Labs  Lab 01/10/23 1800 01/11/23 0644  WBC 5.0 4.9  NEUTROABS 1.6*  --   HGB 10.5* 10.6*  HCT 32.4* 32.0*  MCV 87.3 86.5  PLT 76* 75*   Basic Metabolic Panel:  Recent Labs  Lab 01/10/23 1800 01/11/23 0644  NA 139 138  K 3.6 3.6  CL 103 103  CO2 27 28  GLUCOSE 95 110*  BUN 12 13  CREATININE 0.71 0.84  CALCIUM 9.1 9.2   Lipid Panel:  Recent Labs  Lab 01/11/23 0648  CHOL 126  TRIG 114  HDL 42  CHOLHDL 3.0  VLDL 23  LDLCALC 61   HgbA1c:  Recent Labs  Lab 01/11/23 0644  HGBA1C 6.0*   Urine Drug Screen:  Recent Labs  Lab 01/10/23 2119  LABOPIA NONE DETECTED  COCAINSCRNUR NONE DETECTED  LABBENZ POSITIVE*  AMPHETMU NONE DETECTED  THCU NONE DETECTED  LABBARB NONE DETECTED    Alcohol Level  Recent Labs  Lab 01/10/23 1800  ETH <10    IMAGING past 24 hours No results found.  PHYSICAL EXAM GENERAL: Awake, alert, in no acute distress Psych:  Affect appropriate for situation,  calm and cooperative  Head: Normocephalic and atraumatic EENT: Normal conjunctivae, dry mucous membranes LUNGS: Normal respiratory effort. Non-labored breathing on room air Extremities: warm, well perfused, without obvious deformity   NEURO:  Mental Status: Awake, alert, and oriented to person, place, time, and situation. She is able to provide a clear and coherent history of present illness. Speech/Language: speech is whispering but clear and fluent.   Cranial Nerves:  II: PERRL visual fields full.  III, IV, VI: EOMI.   V: Sensation is intact to light touch and symmetrical to face.  VII: Face is symmetric resting and smiling.  VIII: Hearing intact to voice IX, X: Voice is hoarse and whispery (baseline per patient) XI: Normal sternocleidomastoid and trapezius muscle strength XII: Tongue protrudes midline   Motor: 5/5 strength to right upper extremity and right lower extremity 4/5 strength in left upper extremity. 2/5 strength in left lower extremity Tone is normal. Bulk is normal.  Sensation: Intact to light touch bilaterally in all four extremities.  Coordination: FTN intact bilaterally.  Gait: Deferred    ASSESSMENT/PLAN Ms. KYONA CHAUNCEY is a 77 y.o. female with history of depression, GERD, hyperlipidemia and non-small cell lung cancer  who presents with left leg weakness.  On 2/1, she initially noticed some vertigo and right ear pain.  She fell due to the vertigo while going to the bathroom and hit her head.  In the morning of 2/2, she noticed left leg weakness which made it difficult for her to ambulate and came into the hospital for evaluation.  MRI brain demonstrated small acute infarcts in bilateral hemispheres and the right frontal lobe, ACA distribution.   LKW: 2/2 AM TNK given?: no, outside of window IR Thrombectomy? No, no LVO Modified Rankin Scale: 1-No significant post stroke disability and can perform usual duties with stroke  symptoms  Acute Ischemic Strokes, ACA distribution Etiology: Embolic versus secondary to hypercoagulable state of malignancy  CT head: No acute abnormality  MRI/MRA: Small scattered acute and subacute infarcts in the bilateral cerebral hemisphere suggesting central embolic disease. Enhancing nodule in the right frontal cortex is most likely a subacute infarct but recommend follow-up in 6-8 weeks given active malignancy. No underlying stenosis or irregularity of major arteries in the head and neck  2D Echo: LVEF is 60 to 46%, grade 1 diastolic dysfunction. No evidence of MVR, TVR, AVR.  LDL 61 HgbA1c 6.0 VTE prophylaxis - SCDs    Diet   DIET DYS 3 Room service appropriate? Yes; Fluid consistency: Thin   No antithrombotic prior to admission, now on aspirin 81 mg daily and clopidogrel 75 mg daily.  Will consult with her oncologist tomorrow to discuss possible conversion to Eliquis. Therapy recommendations:  AIR Disposition:  pending Recommend repeat MRI brain w/ and w/o in 6-8 weeks to continue to monitor for potential metastatic disease. Outpatient follow-up with neurology in 8 weeks after discharge.   Hypertension Home meds:  Diovan-HCT Long-term BP goal normotensive  Hyperlipidemia Home meds:  Crestor 20mg , resumed in hospital LDL 61, goal < 70 Continue statin at discharge   Other Stroke Risk Factors Advanced Age >/= 52  Stage IV adenocarcinoma   Hospital day # 1   Pt seen by Neuro NP/APP and later by MD. Note/plan to be edited by MD as needed.    Otelia Santee, DNP, AGACNP-BC [facility] Please use AMION for pager and EPIC for messaging  ATTENDING ATTESTATION:  77 year old with adenocarcinoma spread to lymph nodes on chemotherapy with multiple embolic appearing CVA likely due to hypercoagulability from cancer.  Concerned that 1 lesion may be metastatic and repeat MRI with and without contrast is recommended outpatient in a few weeks.  Will discuss with  oncology tomorrow in regards to anticoagulation.  Certainly she is at high risk if she has mets to the brain of bleeding.  She and her daughter understand this.  Aspirin for now as bridge.  She is having headaches recommend Fioricet as needed for headache relief as Tylenol is not working.  Dr. Reeves Forth evaluated pt independently, reviewed imaging, chart, labs. Discussed and formulated plan with the Resident/APP. Changes were made to the note where appropriate. Please see APP/resident note above for details.   Noel Henandez,MD    To contact Stroke Continuity provider, please refer to [email or web address] After hours, contact General Neurology

## 2023-01-13 ENCOUNTER — Inpatient Hospital Stay (HOSPITAL_COMMUNITY): Payer: Medicare HMO

## 2023-01-13 ENCOUNTER — Other Ambulatory Visit (HOSPITAL_COMMUNITY): Payer: Self-pay

## 2023-01-13 DIAGNOSIS — I639 Cerebral infarction, unspecified: Secondary | ICD-10-CM | POA: Diagnosis not present

## 2023-01-13 MED ORDER — APIXABAN 5 MG PO TABS
5.0000 mg | ORAL_TABLET | Freq: Two times a day (BID) | ORAL | Status: DC
  Administered 2023-01-14 – 2023-01-17 (×7): 5 mg via ORAL
  Filled 2023-01-13 (×7): qty 1

## 2023-01-13 NOTE — Progress Notes (Signed)
Occupational Therapy Treatment Patient Details Name: Patricia [name] MRN: [id number] DOB: 12/19/1945 Today's Date: 01/13/2023   History of present illness 77 y.o. female presents the ED 01/10/23 with a chief complaint of left leg weakness. Also with recent vertigo and fall due to vertigo. MRI  Small scattered acute and subacute infarcts in the bilateral cerebral hemisphere suggesting central embolic disease. Enhancing nodule in the right frontal cortex is most likely a subacute infarct  but recommend follow-up in 6-8 weeks given active malignancy.  PMH significant of depression, GERD, hyperlipidemia, non-small cell lung cancer with last treatment being January 22,   OT comments  Patient continues to make steady progress towards goals in skilled OT session. Patient's session encompassed on increasing standing balance and awareness of midline. Patient with decreased awareness of left leaning in standing and requires assist to correct/maintain midline;patient requiring increased cues to attempt to maintain midline, also requiring cues to maintain attention to tasks to date (min-mod of 2 in standing with use of mobility specialist). OT also finding patient attempting to get back in bed at beginning of OT session. Patient remains highly motivated and participatory and would make an excellent candidate for AIR placement due to prior level of function and family support.    Recommendations for follow up therapy are one component of a multi-disciplinary discharge planning process, led by the attending physician.  Recommendations may be updated based on patient status, additional functional criteria and insurance authorization.    Follow Up Recommendations  Acute inpatient rehab (3hours/day)     Assistance Recommended at Discharge Frequent or constant Supervision/Assistance  Patient can return home with the following  Two people to help with walking and/or transfers;Two people to help with  bathing/dressing/bathroom;Assistance with cooking/housework;Direct supervision/assist for medications management;Direct supervision/assist for financial management;Assist for transportation;Help with stairs or ramp for entrance   Equipment Recommendations  Other (comment) (defer to next venue)    Recommendations for Other Services      Precautions / Restrictions Precautions Precautions: Fall Restrictions Weight Bearing Restrictions: No       Mobility Bed Mobility Overal bed mobility: Needs Assistance Bed Mobility: Supine to Sit     Supine to sit: Min assist, HOB elevated     General bed mobility comments: max cues to lean on L elbow and min A to bring legs back into bed    Transfers Overall transfer level: Needs assistance Equipment used: None Transfers: Sit to/from Stand, Bed to chair/wheelchair/BSC Sit to Stand: Mod assist, +2 physical assistance     Step pivot transfers: Mod assist, +2 physical assistance     General transfer comment: +lean to left and posterior with assist to correct to midline; attempted to assist with weight-shift over RLE to allow her to advance LLE, patient scooting feet across floor to transfer back to bed     Balance Overall balance assessment: Needs assistance Sitting-balance support: Single extremity supported, Feet supported Sitting balance-Leahy Scale: Poor Sitting balance - Comments: left lean even with RUE support   Standing balance support: Bilateral upper extremity supported, Reliant on assistive device for balance Standing balance-Leahy Scale: Poor Standing balance comment: left and posterior lean                           ADL either performed or assessed with clinical judgement   ADL Overall ADL's : Needs assistance/impaired  Toilet Transfer: Moderate assistance;+2 for physical assistance;+2 for safety/equipment Toilet Transfer Details (indicate cue type and reason): attempting  stand pivot with mobility specialist         Functional mobility during ADLs: Moderate assistance;+2 for physical assistance;+2 for safety/equipment General ADL Comments: Session focus on increasing standing balance and awareness of midline    Extremity/Trunk Assessment              Vision       Perception     Praxis      Cognition Arousal/Alertness: Awake/alert Behavior During Therapy: WFL for tasks assessed/performed Overall Cognitive Status: Impaired/Different from baseline Area of Impairment: Attention, Safety/judgement, Awareness, Problem solving                   Current Attention Level: Selective     Safety/Judgement: Decreased awareness of safety, Decreased awareness of deficits Awareness: Intellectual Problem Solving: Decreased initiation, Requires verbal cues, Requires tactile cues, Difficulty sequencing General Comments: decr awareness of left leaning in standing and requires assist to correct/maintain midline;patient requiring increased cues to attempt to maintain midline, also requiring cues to maintain attention to tasks to date and OT finding patient attempting to get back in bed at beginning of OT session        Exercises      Shoulder Instructions       General Comments      Pertinent Vitals/ Pain       Pain Assessment Pain Assessment: No/denies pain  Home Living     Available Help at Discharge: Family Type of Home: House                              Lives With: Spouse    Prior Functioning/Environment              Frequency  Min 2X/week        Progress Toward Goals  OT Goals(current goals can now be found in the care plan section)  Progress towards OT goals: Progressing toward goals  Acute Rehab OT Goals Patient Stated Goal: to get better OT Goal Formulation: With patient Time For Goal Achievement: 01/25/23 Potential to Achieve Goals: Good  Plan Discharge plan remains appropriate     Co-evaluation                 AM-PAC OT "6 Clicks" Daily Activity     Outcome Measure   Help from another person eating meals?: None Help from another person taking care of personal grooming?: A Little Help from another person toileting, which includes using toliet, bedpan, or urinal?: A Lot Help from another person bathing (including washing, rinsing, drying)?: A Lot Help from another person to put on and taking off regular upper body clothing?: A Little Help from another person to put on and taking off regular lower body clothing?: A Lot 6 Click Score: 16    End of Session Equipment Utilized During Treatment: Gait belt  OT Visit Diagnosis: Unsteadiness on feet (R26.81);Muscle weakness (generalized) (M62.81);Other abnormalities of gait and mobility (R26.89);History of falling (Z91.81);Other symptoms and signs involving cognitive function   Activity Tolerance Patient tolerated treatment well   Patient Left in bed;with call bell/phone within reach;with bed alarm set;with family/visitor present   Nurse Communication Mobility status        Time: 2130-8657 OT Time Calculation (min): 28 min  Charges: OT General Charges $OT Visit: 1 Visit OT Treatments $Self Care/Home Management :  23-37 mins  [place] Camden Mazzaferro, OTR/L Acute Rehabilitation Services [phone]   Ascencion Dike 01/13/2023, 3:33 PM

## 2023-01-13 NOTE — Evaluation (Signed)
Speech Language Pathology Evaluation Patient Details Name: Patricia [name] MRN: [id number] DOB: January 15, 1946 Today's Date: 01/13/2023 Time: 7035-0093 SLP Time Calculation (min) (ACUTE ONLY): 17 min  Problem List:  Patient Active Problem List   Diagnosis Date Noted   Acute CVA (cerebrovascular accident) ([place]) 01/11/2023   Encounter for antineoplastic chemotherapy 12/30/2022   Adenocarcinoma of left lung, stage 4 ([place]) 12/19/2022   Left lower lobe pulmonary nodule 11/22/2022   Lymphadenopathy of head and neck region 11/22/2022   Hoarseness 09/07/2022   Primary hypertension 08/30/2022   Hiatal hernia 08/05/2022   Persistent depressive disorder, moderate 08/03/2022   Dysphagia 07/13/2021   Anxiety and depression 07/23/2017   Encounter for antineoplastic immunotherapy 02/19/2017   Prolapse of vaginal vault after hysterectomy 12/20/2016   GERD (gastroesophageal reflux disease) 04/11/2014   Insomnia 04/11/2014   Post-operative state 02/07/2014   Rectocele 12/22/2013   Impaired fasting glucose 08/27/2013   HLD (hyperlipidemia) 08/27/2013   Osteopenia 08/27/2013   Past Medical History:  Past Medical History:  Diagnosis Date   Depression    GERD (gastroesophageal reflux disease)    Hypercholesterolemia    IFG (impaired fasting glucose)    Osteopenia    PONV (postoperative nausea and vomiting)    Past Surgical History:  Past Surgical History:  Procedure Laterality Date   ABDOMINAL HYSTERECTOMY     CATARACT EXTRACTION W/PHACO  09/12/2011   Procedure: CATARACT EXTRACTION PHACO AND INTRAOCULAR LENS PLACEMENT ([place]);  Surgeon: Tonny Branch;  Location: [facility];  Service: Ophthalmology;  Laterality: Left;  CDE: 8.91   CATARACT EXTRACTION W/PHACO  11/18/2011   Procedure: CATARACT EXTRACTION PHACO AND INTRAOCULAR LENS PLACEMENT ([facility]);  Surgeon: Tonny Branch;  Location: [facility];  Service: Ophthalmology;  Laterality: Right;  CDE:10.26   COLONOSCOPY  2007   Dr. Gala Romney: internal hemorrhoids, single  anal papilla poor prep.    COLONOSCOPY N/A 03/27/2017   Rourk: Normal exam.   ECTOPIC PREGNANCY SURGERY     removal of right tube   ESOPHAGOGASTRODUODENOSCOPY (EGD) WITH PROPOFOL N/A 09/10/2021   Procedure: ESOPHAGOGASTRODUODENOSCOPY (EGD) WITH PROPOFOL;  Surgeon: Daneil Dolin, MD;  Location: [facility];  Service: Endoscopy;  Laterality: N/A;  8:15AM   MALONEY DILATION N/A 09/10/2021   Procedure: Venia Minks DILATION;  Surgeon: Daneil Dolin, MD;  Location: [facility];  Service: Endoscopy;  Laterality: N/A;   OVARIAN CYST REMOVAL     right   RECTOCELE REPAIR N/A 01/18/2014   Procedure: POSTERIOR REPAIR (RECTOCELE);  Surgeon: Jonnie Kind, MD;  Location: [facility];  Service: Gynecology;  Laterality: N/A;   VAGINAL HYSTERECTOMY N/A 01/18/2014   Procedure: HYSTERECTOMY VAGINAL;  Surgeon: Jonnie Kind, MD;  Location: [facility];  Service: Gynecology;  Laterality: N/A;   HPI:  Patricia [name] is a 77 yo female with PMH Stage IV NSCLC adenocarcinoma (extensive lymphadenopathy involving the neck, left hilar and mediastinal lymphadenopathy as well as upper abdomen lymph nodes and multiple subcutaneous/muscular nodules diagnosed in January 2024) on palliative systemic chemo, depression, GERD, HLD. She presented with acute onset of left lower extremity weakness notably dragging her left leg starting on approximately Thursday. She was recently seen approximately 1 week ago for new onset of headache and had no focal neurodeficits at that time. She was admitted for stroke workup. MRI brain was performed which showed small scattered acute and subacute infarcts in the bilateral cerebral hemisphere; MRI 01/11/23 revealed Small scattered acute and subacute infarcts in the bilateral cerebral hemisphere suggesting central embolic disease. Enhancing nodule in the  right frontal cortex is most likely a subacute infarct but recommend follow-up in 6-8 weeks given active malignancy. Pt denotes "I have problems remembering  some things"   Assessment / Plan / Recommendation Clinical Impression  Pt assessed via the [place] Mental Status Examination (SLUMS) with a score obtained of 23/30 with deficits noted within the areas of memory recall, intellectual awareness, sustained attention and problem solving.  A typical score on this assessment is 27/30.  [name], OME revealed slight L labial weakness at rest, but functional for swallowing/speech.  Vocal quality at baseline d/t recent pharyngeal CA dx impacts overall intelligibility within words-conversation d/t breathy/aphonic vocal quality during conversational turns.  Pt with some self-awareness of memory recall issues as she would question "Did I say that one already?" when repeating animals during word fluency task.  She experienced difficulty with subtraction portion of simple calculation task, repeating digits in reverse order and recall of objects after a time delay.  Recommend ST f/u for cognitive/linguistic changes/dysphagia tx while in acute setting. Thank you for this consult.    SLP Assessment  SLP Recommendation/Assessment: Patient needs continued Speech Language Pathology Services SLP Visit Diagnosis: Cognitive communication deficit (R41.841);Attention and concentration deficit Attention and concentration deficit following: Cerebral infarction    Recommendations for follow up therapy are one component of a multi-disciplinary discharge planning process, led by the attending physician.  Recommendations may be updated based on patient status, additional functional criteria and insurance authorization.    Follow Up Recommendations  Acute inpatient rehab (3hours/day)    Assistance Recommended at Discharge  Intermittent Supervision/Assistance  Functional Status Assessment Patient has had a recent decline in their functional status and demonstrates the ability to make significant improvements in function in a reasonable and predictable amount of  time.  Frequency and Duration min 2x/week  1 week      SLP Evaluation Cognition  Overall Cognitive Status: Impaired/Different from baseline Arousal/Alertness: Awake/alert Orientation Level: Oriented X4 Year: 2024 Month: February Day of Week: Correct Attention: Sustained Sustained Attention: Impaired Sustained Attention Impairment: Verbal basic;Functional basic Memory: Impaired Memory Impairment: Decreased recall of new information;Decreased short term memory Decreased Short Term Memory: Verbal basic;Functional basic Awareness: Impaired Awareness Impairment: Intellectual impairment Problem Solving: Impaired Problem Solving Impairment: Verbal complex;Functional complex Behaviors: Perseveration Safety/Judgment: Other (comment) (appeared WFL for tasks assessed)       Comprehension  Auditory Comprehension Overall Auditory Comprehension: Appears within functional limits for tasks assessed Yes/No Questions: Within Functional Limits Commands: Within Functional Limits Conversation: Complex Interfering Components: Pain;Attention;Working Field seismologist: Conservation officer, nature: Within [employer] Reading Comprehension Reading Status: Within funtional limits    Expression Expression Primary Mode of Expression: Verbal Verbal Expression Overall Verbal Expression: Appears within functional limits for tasks assessed Level of Generative/Spontaneous Verbalization: Conversation Naming: Not tested Pragmatics: No impairment Interfering Components: Attention Non-Verbal Means of Communication: Not applicable Written Expression Dominant Hand: Right Written Expression: Within Functional Limits   Oral / Motor  Oral Motor/Sensory Function Overall Oral Motor/Sensory Function: Within functional limits Motor Speech Overall Motor Speech: Appears within functional limits for tasks assessed Respiration: Within  functional limits Phonation: Breathy;Other (comment) (aphonic intermittently; d/t L pharyngeal adinocarcinoma; L vf dysfunction/paralysis per ENT report conveyed by family) Articulation: Impaired Level of Impairment: Conversation Intelligibility: Intelligibility reduced Word: 75-100% accurate Phrase: 75-100% accurate Sentence: 75-100% accurate Conversation: 75-100% accurate Motor Planning: Witnin functional limits Motor Speech Errors: Not applicable Interfering Components: Premorbid status  Pat Sefora Tietje,M.S., CCC-SLP 01/13/2023, 12:53 PM

## 2023-01-13 NOTE — TOC Benefit Eligibility Note (Signed)
Patient Teacher, English as a foreign language completed.    The patient is currently admitted and upon discharge could be taking Eliquis 5 mg.  The current 30 day co-pay is $47.00.   The patient is insured through [place], [place] Patient Advocate Specialist [place] Patient Advocate Team Direct Number: [phone]  Fax: [phone]

## 2023-01-13 NOTE — Consult Note (Signed)
Modified Barium Swallow Progress Note  Patient Details  Name: Patricia [name] MRN: [id number] Date of Birth: 10/21/1946  Today's Date: 01/13/2023  Modified Barium Swallow completed.  Full report located under Chart Review in the Imaging Section.  Brief recommendations include the following:  Clinical Impression  Pt with essentially normal swallow function with exception of piecemeal deglutition with mixed consistency spilling into vallecular space/pyriform (barely) prior to swallow (potentially d/t inattention) which places pt at risk for penetration/aspiration with this consistency during meals.  Discussed swallowing strategies to eliminate this risk including smaller bites or draining liquid from spoon prior to consuming mixed consistency.  Pt in agreement.  Recommend continue current diet of Mechanical soft/thin (Dysphagia 3) with gravy included with meats and/or mixing with other moist foods to eliminate dryness during consumption of POs.  ST will f/u for diet tolerance/cog tx in acute setting.  Recommend CIR if pt able.   Swallow Evaluation Recommendations       SLP Diet Recommendations: Dysphagia 3 (Mech soft) solids;Thin liquid   Liquid Administration via: Cup;Straw;Other (Comment) (small sips)   Medication Administration: Whole meds with liquid (or with puree if larger pill)   Supervision: Patient able to self feed;Staff to assist with self feeding;Intermittent supervision to cue for compensatory strategies   Compensations: Slow rate;Small sips/bites;Minimize environmental distractions   Postural Changes: Seated upright at 90 degrees   Oral Care Recommendations: Oral care BID;Patient independent with oral care   Other Recommendations: Other (Comment) (place gravy on each tray to moisten dry foods prior to consumption)    Pat Kebra [place],M.S., CCC-SLP 01/13/2023,10:25 AM

## 2023-01-13 NOTE — Progress Notes (Addendum)
STROKE TEAM PROGRESS NOTE   INTERVAL HISTORY No family at bedside.   Reports doing well, no headaches today.   MRI brain showed small scattered acute and subacute infarcts in the bilateral cerebral hemispheres and right frontal lobe, ACA distribution.  Attempted to contact pt's oncologst at [facility], left message to discuss [facility] Vitals:   01/13/23 0344 01/13/23 0400 01/13/23 0600 01/13/23 0804  BP: (!) 101/57   117/70  Pulse: 70 73  78  Resp: 17 19 15 16   Temp: 98 F (36.7 C)   (!) 97.5 F (36.4 C)  TempSrc: Oral   Oral  SpO2: 93%   95%  Weight:      Height:       CBC:  Recent Labs  Lab 01/10/23 1800 01/11/23 0644  WBC 5.0 4.9  NEUTROABS 1.6*  --   HGB 10.5* 10.6*  HCT 32.4* 32.0*  MCV 87.3 86.5  PLT 76* 75*    Basic Metabolic Panel:  Recent Labs  Lab 01/10/23 1800 01/11/23 0644  NA 139 138  K 3.6 3.6  CL 103 103  CO2 27 28  GLUCOSE 95 110*  BUN 12 13  CREATININE 0.71 0.84  CALCIUM 9.1 9.2    Lipid Panel:  Recent Labs  Lab 01/11/23 0648  CHOL 126  TRIG 114  HDL 42  CHOLHDL 3.0  VLDL 23  LDLCALC 61    HgbA1c:  Recent Labs  Lab 01/11/23 0644  HGBA1C 6.0*    Urine Drug Screen:  Recent Labs  Lab 01/10/23 2119  LABOPIA NONE DETECTED  COCAINSCRNUR NONE DETECTED  LABBENZ POSITIVE*  AMPHETMU NONE DETECTED  THCU NONE DETECTED  LABBARB NONE DETECTED     Alcohol Level  Recent Labs  Lab 01/10/23 1800  ETH <10     IMAGING past 24 hours DG Swallowing Func-Speech Pathology  Result Date: 01/13/2023 Table formatting from the original result was not included. Objective Swallowing Evaluation: Type of Study: MBS-Modified Barium Swallow Study  Patient Details Name: SAMAA UEDA MRN: [id number] Date of Birth: July 18, 1946 Today's Date: 01/13/2023 Time: SLP Start Time (ACUTE ONLY): 0940 -SLP Stop Time (ACUTE ONLY): 0956 SLP Time Calculation (min) (ACUTE ONLY): 16 min Past Medical History: Past Medical History: Diagnosis Date  Depression   GERD  (gastroesophageal reflux disease)   Hypercholesterolemia   IFG (impaired fasting glucose)   Osteopenia   PONV (postoperative nausea and vomiting)  Past Surgical History: Past Surgical History: Procedure Laterality Date  ABDOMINAL HYSTERECTOMY    CATARACT EXTRACTION W/PHACO  09/12/2011  Procedure: CATARACT EXTRACTION PHACO AND INTRAOCULAR LENS PLACEMENT ([place]);  Surgeon: Tonny Branch;  Location: [facility];  Service: Ophthalmology;  Laterality: Left;  CDE: 8.91  CATARACT EXTRACTION W/PHACO  11/18/2011  Procedure: CATARACT EXTRACTION PHACO AND INTRAOCULAR LENS PLACEMENT (IOC);  Surgeon: Tonny Branch;  Location: [facility];  Service: Ophthalmology;  Laterality: Right;  CDE:10.26  COLONOSCOPY  2007  Dr. Gala Romney: internal hemorrhoids, single anal papilla poor prep.   COLONOSCOPY N/A 03/27/2017  Rourk: Normal exam.  ECTOPIC PREGNANCY SURGERY    removal of right tube  ESOPHAGOGASTRODUODENOSCOPY (EGD) WITH PROPOFOL N/A 09/10/2021  Procedure: ESOPHAGOGASTRODUODENOSCOPY (EGD) WITH PROPOFOL;  Surgeon: Daneil Dolin, MD;  Location: [facility];  Service: Endoscopy;  Laterality: N/A;  8:15AM  MALONEY DILATION N/A 09/10/2021  Procedure: Venia Minks DILATION;  Surgeon: Daneil Dolin, MD;  Location: [facility];  Service: Endoscopy;  Laterality: N/A;  OVARIAN CYST REMOVAL    right  RECTOCELE REPAIR N/A 01/18/2014  Procedure: POSTERIOR REPAIR (  RECTOCELE);  Surgeon: Jonnie Kind, MD;  Location: [facility];  Service: Gynecology;  Laterality: N/A;  VAGINAL HYSTERECTOMY N/A 01/18/2014  Procedure: HYSTERECTOMY VAGINAL;  Surgeon: Jonnie Kind, MD;  Location: [facility];  Service: Gynecology;  Laterality: N/A; HPI: Ms. Borawski is a 77 yo female with PMH Stage IV NSCLC adenocarcinoma (extensive lymphadenopathy involving the neck, left hilar and mediastinal lymphadenopathy as well as upper abdomen lymph nodes and multiple subcutaneous/muscular nodules diagnosed in January 2024) on palliative systemic chemo, depression, GERD, HLD.  She presented on 01/11/23  with acute onset of left lower extremity weakness notably dragging her left leg starting on approximately Thursday.  She was recently seen approximately 1 week ago for new onset of headache and had no focal neurodeficits at that time.  She was admitted for stroke workup.  MRI brain was performed which showed small scattered acute and subacute infarcts in the bilateral cerebral hemisphere; Pt with choking episode on 01/11/33 during lunch tray, so BSE completed and pt placed on D3/thin liquid diet with precautions; MBS recommended to r/o silent aspiration/determine safest diet consistency/assess swallow function under VF d/t new CVA dx/L adinocarcinoma present.  Pt denoted she "got choked on Frosted flakes" this morning.  Subjective: "I got choked on Frosted Flakes"  Recommendations for follow up therapy are one component of a multi-disciplinary discharge planning process, led by the attending physician.  Recommendations may be updated based on patient status, additional functional criteria and insurance authorization. Assessment / Plan / Recommendation   01/13/2023  10:00 AM Clinical Impressions Clinical Impression Pt with essentially normal swallow function with exception of piecemeal deglutition with mixed consistency spilling into vallecular space/pyriform (barely) prior to swallow which places pt at risk for penetration/aspiration with this consistency during meals.  Discussed swallowing strategies to eliminate this risk including smaller bites or draining liquid from spoon prior to consuming mixed consistency.  Pt in agreement.  Recommend continue current diet of Mechanical soft/thin (Dysphagia 3) with gravy included with meats and/or mixing with other moist foods to eliminate dryness during consumption of POs.  ST will f/u for diet tolerance/education/cog tx in acute setting.  Recommend CIR if pt able. SLP Visit Diagnosis Dysphagia, unspecified (R13.10) Impact on safety and function Mild aspiration risk      01/13/2023  10:00 AM Treatment Recommendations Treatment Recommendations Therapy as outlined in treatment plan below     01/13/2023  10:00 AM Prognosis Prognosis for Safe Diet Advancement Good   01/13/2023  10:00 AM Diet Recommendations SLP Diet Recommendations Dysphagia 3 (Mech soft) solids;Thin liquid Liquid Administration via Cup;Straw;Other (Comment) Medication Administration Whole meds with liquid Compensations Slow rate;Small sips/bites;Minimize environmental distractions Postural Changes Seated upright at 90 degrees     01/13/2023  10:00 AM Other Recommendations Oral Care Recommendations Oral care BID;Patient independent with oral care Other Recommendations Other (Comment) Follow Up Recommendations Acute inpatient rehab (3hours/day) Functional Status Assessment Patient has had a recent decline in their functional status and demonstrates the ability to make significant improvements in function in a reasonable and predictable amount of time.   01/13/2023  10:00 AM Frequency and Duration  Speech Therapy Frequency (ACUTE ONLY) min 2x/week Treatment Duration 1 week     01/13/2023  10:00 AM Oral Phase Oral Phase [facility]    01/13/2023  10:00 AM Pharyngeal Phase Pharyngeal Phase Impaired Pharyngeal- Thin Teaspoon WFL Pharyngeal Material does not enter airway Pharyngeal- Thin Cup [facility] Pharyngeal Material does not enter airway Pharyngeal- Thin Straw WFL Pharyngeal Material does not enter airway Pharyngeal-  Puree WFL Pharyngeal Material does not enter airway Pharyngeal- Mechanical Soft WFL Pharyngeal Material does not enter airway Pharyngeal- Regular NT Pharyngeal- Multi-consistency Delayed swallow initiation-vallecula;Delayed swallow initiation-pyriform sinuses Pharyngeal Material does not enter airway;Other (Comment) Pharyngeal- Pill NT Pharyngeal Comment Pt's swallow appeared within functional limits with exception of mixed consistencies which entered vallecular space and min in pyriforms prior to swallow, but did NOT enter airway     01/13/2023  10:00 AM Cervical Esophageal Phase  Cervical Esophageal Phase [facility] Elvina Sidle, M.S., CCC-SLP 01/13/2023, 10:21 AM                      PHYSICAL EXAM GENERAL: Awake, alert, in no acute distress Psych: Affect appropriate for situation,  calm and cooperative  Head: Normocephalic and atraumatic EENT: Normal conjunctivae, dry mucous membranes LUNGS: Normal respiratory effort. Non-labored breathing on room air Extremities: warm, well perfused, without obvious deformity   NEURO:  Mental Status: Awake, alert, and oriented to person, place, time, and situation. She is able to provide a clear and coherent history of present illness. Speech/Language: speech is whispering but clear and fluent.   Cranial Nerves:  II: PERRL visual fields full.  III, IV, VI: EOMI.   V: Sensation is intact to light touch and symmetrical to face.  VII: Face is symmetric resting and smiling.  VIII: Hearing intact to voice IX, X: Voice is hoarse and whispery (baseline per patient) XI: Normal sternocleidomastoid and trapezius muscle strength XII: Tongue protrudes midline   Motor: 5/5 strength to right upper extremity and right lower extremity 4/5 strength in left upper extremity. 2/5 strength in left lower extremity Tone is normal. Bulk is normal.  Sensation: Intact to light touch bilaterally in all four extremities.  Coordination: FTN intact bilaterally.  Gait: Deferred    ASSESSMENT/PLAN Ms. AZHA CONSTANTIN is a 77 y.o. female with history of depression, GERD, hyperlipidemia and non-small cell lung cancer who presents with left leg weakness.  On 2/1, she initially noticed some vertigo and right ear pain.  She fell due to the vertigo while going to the bathroom and hit her head.  In the morning of 2/2, she noticed left leg weakness which made it difficult for her to ambulate and came into the hospital for evaluation.  MRI brain demonstrated small acute infarcts in bilateral hemispheres and the right frontal  lobe, ACA distribution.   LKW: 2/2 AM TNK given?: no, outside of window IR Thrombectomy? No, no LVO Modified Rankin Scale: 1-No significant post stroke disability and can perform usual duties with stroke symptoms  Acute Ischemic Strokes, ACA distribution Etiology: Embolic versus secondary to hypercoagulable state of malignancy  CT head: No acute abnormality  MRI/MRA: Small scattered acute and subacute infarcts in the bilateral cerebral hemisphere suggesting central embolic disease. Enhancing nodule in the right frontal cortex is most likely a subacute infarct but recommend follow-up in 6-8 weeks given active malignancy. No underlying stenosis or irregularity of major arteries in the head and neck  2D Echo: LVEF is 60 to 76%, grade 1 diastolic dysfunction. No evidence of MVR, TVR, AVR.  LDL 61 HgbA1c 6.0 VTE prophylaxis - SCDs    Diet   DIET DYS 3 Room service appropriate? Yes; Fluid consistency: Thin   No antithrombotic prior to admission, now on aspirin 81 mg daily and clopidogrel 75 mg daily.  Will consult with her oncologist tomorrow to discuss possible conversion to Eliquis. Therapy recommendations:  AIR Disposition:  pending Recommend repeat MRI brain w/  and w/o in 6-8 weeks to continue to monitor for potential metastatic disease. Outpatient follow-up with neurology in 8 weeks after discharge.   Hypertension Home meds:  Diovan-HCT Long-term BP goal normotensive  Hyperlipidemia Home meds:  Crestor 20mg , resumed in hospital LDL 61, goal < 70 Continue statin at discharge   Other Stroke Risk Factors Advanced Age >/= 72  Stage IV adenocarcinoma   Hospital day # 35   77 year old with adenocarcinoma spread to lymph nodes on chemotherapy with multiple embolic appearing CVA likely due to hypercoagulability from cancer.  Concerned that 1 lesion may be metastatic and repeat MRI with and without contrast is recommended outpatient in a few weeks.  Trying to get in touch with  oncology today in regards to anticoagulation.  Certainly she is at high risk if she has mets to the brain of bleeding.  She and her daughter understand this.  Discussed with Dr. Earlie Server patient's oncologist.  He is in agreement with Eliquis for anticoagulation.  Aspirin Plavix stopped today Eliquis added.  Neurology to sign off please call with questions.  Follow-up in stroke clinic outpatient.    To contact Stroke Continuity provider, please refer to [email or web address] After hours, contact General Neurology

## 2023-01-13 NOTE — Progress Notes (Signed)
Progress Note    Patricia [name]   DDU:[id number]  DOB: March 12, 1946  DOA: 01/10/2023     2 PCP: Curt Bears, MD  Initial CC: LLE weakness  Hospital Course: Patricia [name] is a 77 yo female with PMH Stage IV NSCLC adenocarcinoma (extensive lymphadenopathy involving the neck, left hilar and mediastinal lymphadenopathy as well as upper abdomen lymph nodes and multiple subcutaneous/muscular nodules diagnosed in January 2024) on palliative systemic chemo, depression, GERD, HLD.  She presented with acute onset of left lower extremity weakness notably dragging her left leg starting on approximately Thursday.  She was recently seen approximately 1 week ago for new onset of headache and had no focal neurodeficits at that time. She was admitted for stroke workup.  MRI brain was performed which showed small scattered acute and subacute infarcts in the bilateral cerebral hemisphere.  Interval History:  Swallow study today. Staying on dys 3 diet. No events overnight.  Some improvement in her left-sided strength when seen.  Husband and granddaughter bedside as well.  Assessment and Plan: * Acute CVA (cerebrovascular accident) [facility]) - Presented with left lower extremity weakness onset approximately Thursday.  Admitted for stroke workup - MRI brain revealing small scattered acute and subacute infarcts in the bilateral cerebral hemisphere; enhancing nodule in the right frontal cortex suggesting subacute infarct but recommended for follow-up 6 to 8 weeks given her underlying malignancy - neurology consulted - LDL 61 (on Crestor). A1c 6% -Continue aspirin and Plavix for now - PT/OT recommending CIR - evaluated by SLP. Continue dysphagia 3 diet; follow-up modified barium swallow results today -Stroke workup complete.  Neurology referral at discharge with repeat MRI brain [place] contrast in 6-8 weeks to monitor for any metastatic disease - continue CIR workup -Follow-up oncology/neurology discussion  regarding anticoagulation versus antiplatelet therapy  Primary hypertension - Holding valsartan and hydrochlorothiazide secondary to permissive hypertension; but BP also low as is  Hoarseness - Chronic, due to cancer and oncologic treatments  Adenocarcinoma of left lung, stage 4 ([place]) - follows with Dr. Julien Nordmann; diagnosed Jan 2024 - Stage IV (T1c, N3, M1 C) non-small cell lung cancer, adenocarcinoma presented with left upper lobe metastatic neoplasm presented with left lower lobe lung nodule in addition to extensive lymphadenopathy involving the neck, left hilar and mediastinal lymphadenopathy as well as upper abdomen lymph nodes and multiple subcutaneous/muscular nodules diagnosed in January 2024.   Anxiety and depression - Continue Xanax and Paxil - UDS shows benzos which is expected with patient having prescription for Xanax  GERD (gastroesophageal reflux disease) - Continue PPI  HLD (hyperlipidemia) - Continue Crestor - LDL 61   Old records reviewed in assessment of this patient  Antimicrobials:   DVT prophylaxis:  heparin injection 5,000 Units Start: 01/11/23 0600 SCD's Start: 01/11/23 0446   Code Status:   Code Status: DNR  Mobility Assessment (last 72 hours)     Mobility Assessment     Row Name 01/12/23 2000 01/12/23 1630 01/12/23 1230 01/12/23 0951 01/12/23 0930   Does patient have an order for bedrest or is patient medically unstable No - Continue assessment No - Continue assessment No - Continue assessment -- No - Continue assessment   What is the highest level of mobility based on the progressive mobility assessment? Level 3 (Stands with assist) - Balance while standing  and cannot march in place Level 3 (Stands with assist) - Balance while standing  and cannot march in place Level 3 (Stands with assist) - Balance while standing  and cannot march  in place Level 3 (Stands with assist) - Balance while standing  and cannot march in place Level 3 (Stands with assist)  - Balance while standing  and cannot march in place   Is the above level different from baseline mobility prior to current illness? Yes - Recommend PT order -- -- -- --    [place] Name 01/11/23 2000 01/11/23 1700 01/11/23 1233 01/11/23 0930 01/11/23 0403   Does patient have an order for bedrest or is patient medically unstable No - Continue assessment -- -- No - Continue assessment No - Continue assessment   What is the highest level of mobility based on the progressive mobility assessment? Level 3 (Stands with assist) - Balance while standing  and cannot march in place Level 3 (Stands with assist) - Balance while standing  and cannot march in place Level 3 (Stands with assist) - Balance while standing  and cannot march in place Level 3 (Stands with assist) - Balance while standing  and cannot march in place Level 3 (Stands with assist) - Balance while standing  and cannot march in place            Barriers to discharge:  Disposition Plan:  CIR Status is: Inpatient  Objective: Blood pressure 117/70, pulse 78, temperature (!) 97.5 F (36.4 C), temperature source Oral, resp. rate 16, height 4\' 10"  (1.473 m), weight 63 kg, SpO2 95 %.  Examination:  Physical Exam Constitutional:      General: She is not in acute distress.    Appearance: Normal appearance.     Comments: Whispering voice (baseline)  HENT:     Head: Normocephalic and atraumatic.     Mouth/Throat:     Mouth: Mucous membranes are moist.  Eyes:     Extraocular Movements: Extraocular movements intact.  Cardiovascular:     Rate and Rhythm: Normal rate and regular rhythm.  Pulmonary:     Effort: No respiratory distress.     Breath sounds: Normal breath sounds. No wheezing.  Abdominal:     General: Bowel sounds are normal. There is no distension.     Palpations: Abdomen is soft.     Tenderness: There is no abdominal tenderness.  Musculoskeletal:        General: Normal range of motion.     Cervical back: Normal range of  motion and neck supple.  Skin:    General: Skin is warm and dry.  Neurological:     Mental Status: She is alert.     Comments: Improved LLE weakness (now 4/5 from 3+/5); improved LUE weakness 4+/5. No paresthesias.   Psychiatric:        Mood and Affect: Mood normal.        Behavior: Behavior normal.      Consultants:  Neurology  Procedures:    Data Reviewed: No results found for this or any previous visit (from the past 24 hour(s)).   I have reviewed pertinent nursing notes, vitals, labs, and images as necessary. I have ordered labwork to follow up on as indicated.  I have reviewed the last notes from staff over past 24 hours. I have discussed patient's care plan and test results with nursing staff, CM/SW, and other staff as appropriate.  Time spent: Greater than 50% of the 55 minute visit was spent in counseling/coordination of care for the patient as laid out in the A&P.   LOS: 2 days   Dwyane Dee, MD [facility] 01/13/2023, 9:59 AM

## 2023-01-13 NOTE — Progress Notes (Signed)
  Transition of Care [facility]) Screening Note   Patient Details  Name: Patricia [name] Date of Birth: [birth year]   [facility]) CM/SW Contact:    Pollie Friar, RN Phone Number: 01/13/2023, 3:03 PM   Pt is from home with spouse. CIR awaiting cancer treatment plan to see if pt will be able to do rehab. Transition of Care Department [facility]) has reviewed patient. We will continue to monitor patient advancement through interdisciplinary progression rounds. If new patient transition needs arise, please place a TOC consult.

## 2023-01-13 NOTE — Progress Notes (Signed)
Inpatient Rehab Coordinator Note:  I met with patient, her spouse, and her granddaughter at bedside to discuss CIR recommendations and goals/expectations of CIR stay.  We reviewed 3 hrs/day of therapy, physician follow up, and average length of stay 2 weeks (dependent upon progress) with goals tbd.  We discussed need to determine oncology plan for treatment and how CIR fits in with that.  I let her know that we cannot do chemo while on CIR. Medical team touching base with oncology.  I will continue to follow.   Shann Medal, PT, DPT Admissions Coordinator [phone] 01/13/23  1:12 PM

## 2023-01-13 NOTE — Progress Notes (Signed)
Physical Therapy Treatment Patient Details Name: Patricia [name] MRN: [id number] DOB: 1945-12-16 Today's Date: 01/13/2023   History of Present Illness 77 y.o. female presents the ED 01/10/23 with a chief complaint of left leg weakness. Also with recent vertigo and fall due to vertigo. MRI  Small scattered acute and subacute infarcts in the bilateral cerebral hemisphere suggesting central embolic disease. Enhancing nodule in the right frontal cortex is most likely a subacute infarct  but recommend follow-up in 6-8 weeks given active malignancy.  PMH significant of depression, GERD, hyperlipidemia, non-small cell lung cancer with last treatment being January 22,    PT Comments    Patient with bright affect and eager to work with therapy. Moving LLE in supine better than previous visits and decided to try bed to chair transfer without stedy. Pt with poor ability to come to full standing with assist at pelvis to prevent posterior bias; pt resisting attempts to weight shift to her rt to allow advancement of LLE. Pt able to "shimmy" on rt foot to advance from bed to chair on her rt. Nursing updated and recommended continued use of stedy for safest transfer.     Recommendations for follow up therapy are one component of a multi-disciplinary discharge planning process, led by the attending physician.  Recommendations may be updated based on patient status, additional functional criteria and insurance authorization.  Follow Up Recommendations  Acute inpatient rehab (3hours/day)     Assistance Recommended at Discharge Frequent or constant Supervision/Assistance  Patient can return home with the following Two people to help with walking and/or transfers;Assistance with cooking/housework;Direct supervision/assist for medications management;Direct supervision/assist for financial management;Assist for transportation;Help with stairs or ramp for entrance   Equipment Recommendations  Wheelchair (measurements  PT);Wheelchair cushion (measurements PT)    Recommendations for Other Services       Precautions / Restrictions Precautions Precautions: Fall Restrictions Weight Bearing Restrictions: No     Mobility  Bed Mobility Overal bed mobility: Needs Assistance Bed Mobility: Rolling, Sidelying to Sit Rolling: Supervision Sidelying to sit: Min assist, HOB elevated       General bed mobility comments: roll to left, left side to sit with assist and cues for sequencing; assist to move LLE over EoB and to raise torso to come to midline    Transfers Overall transfer level: Needs assistance Equipment used: None Transfers: Sit to/from Stand, Bed to chair/wheelchair/BSC Sit to Stand: Mod assist, +2 physical assistance   Step pivot transfers: Mod assist, +2 physical assistance       General transfer comment: +lean to left and posterior with assist to correct to midline; attempted to assist with weight-shift over RLE to allow her to advance LLE, however pt resists shifting to her rt; pt "shimmied" rt foot to advance to chair    Ambulation/Gait               General Gait Details: unable   Stairs             Wheelchair Mobility    Modified Rankin (Stroke Patients Only) Modified Rankin (Stroke Patients Only) Pre-Morbid Rankin Score: No symptoms Modified Rankin: Severe disability     Balance Overall balance assessment: Needs assistance Sitting-balance support: Single extremity supported, Feet supported Sitting balance-Leahy Scale: Poor Sitting balance - Comments: left lean even with RUE support   Standing balance support: Bilateral upper extremity supported, Reliant on assistive device for balance Standing balance-Leahy Scale: Poor Standing balance comment: left and posterior lean  Cognition Arousal/Alertness: Awake/alert Behavior During Therapy: WFL for tasks assessed/performed Overall Cognitive Status: Impaired/Different  from baseline Area of Impairment: Attention, Safety/judgement, Awareness, Problem solving                   Current Attention Level: Selective     Safety/Judgement: Decreased awareness of safety, Decreased awareness of deficits Awareness: Intellectual Problem Solving: Decreased initiation, Requires verbal cues, Requires tactile cues, Difficulty sequencing General Comments: decr awareness of left leaning in sitting and requires assist to correct/maintain midline; able to move LLE in supine much better, however did not translate to standing/transfer        Exercises Other Exercises Other Exercises: AROM LLE prior to OOB    General Comments        Pertinent Vitals/Pain Pain Assessment Pain Assessment: No/denies pain    Home Living     Available Help at Discharge: Family Type of Home: House                  Prior Function            PT Goals (current goals can now be found in the care plan section) Acute Rehab PT Goals Patient Stated Goal: get stronger PT Goal Formulation: With patient Time For Goal Achievement: 01/25/23 Potential to Achieve Goals: Good Progress towards PT goals: Progressing toward goals    Frequency    Min 3X/week      PT Plan Current plan remains appropriate    Co-evaluation              AM-PAC PT "6 Clicks" Mobility   Outcome Measure  Help needed turning from your back to your side while in a flat bed without using bedrails?: A Little Help needed moving from lying on your back to sitting on the side of a flat bed without using bedrails?: A Little Help needed moving to and from a bed to a chair (including a wheelchair)?: Total Help needed standing up from a chair using your arms (e.g., wheelchair or bedside chair)?: Total Help needed to walk in hospital room?: Total Help needed climbing 3-5 steps with a railing? : Total 6 Click Score: 10    End of Session Equipment Utilized During Treatment: Gait belt Activity  Tolerance: Patient tolerated treatment well Patient left: with call bell/phone within reach;in chair;with chair alarm set;with family/visitor present Nurse Communication: Mobility status;Need for lift equipment (recommend continued use of stedy) PT Visit Diagnosis: Hemiplegia and hemiparesis Hemiplegia - Right/Left: Left Hemiplegia - dominant/non-dominant: Non-dominant Hemiplegia - caused by: Cerebral infarction     Time: 1305-1320 PT Time Calculation (min) (ACUTE ONLY): 15 min  Charges:  $Neuromuscular Re-education: 8-22 mins                      Arby Barrette, PT Acute Rehabilitation Services  Office [phone]    Rexanne Mano 01/13/2023, 1:34 PM

## 2023-01-14 ENCOUNTER — Encounter: Payer: Self-pay | Admitting: Internal Medicine

## 2023-01-14 DIAGNOSIS — C3492 Malignant neoplasm of unspecified part of left bronchus or lung: Secondary | ICD-10-CM | POA: Diagnosis not present

## 2023-01-14 DIAGNOSIS — R490 Dysphonia: Secondary | ICD-10-CM | POA: Diagnosis not present

## 2023-01-14 DIAGNOSIS — I639 Cerebral infarction, unspecified: Secondary | ICD-10-CM | POA: Diagnosis not present

## 2023-01-14 LAB — GLUCOSE, CAPILLARY: Glucose-Capillary: 104 mg/dL — ABNORMAL HIGH (ref 70–99)

## 2023-01-14 NOTE — Care Management Important Message (Signed)
Important Message  Patient Details  Name: Patricia [name] MRN: [id number] Date of Birth: 04-Mar-1946   Medicare Important Message Given:  Yes     Orbie Pyo 01/14/2023, 3:01 PM

## 2023-01-14 NOTE — Progress Notes (Signed)
Progress Note    Patricia [name]   LKT:[id number]  DOB: 24-Aug-1946  DOA: 01/10/2023     3 PCP: Curt Bears, MD  Initial CC: LLE weakness  Hospital Course: Patricia [name] is a 77 yo female with PMH Stage IV NSCLC adenocarcinoma (extensive lymphadenopathy involving the neck, left hilar and mediastinal lymphadenopathy as well as upper abdomen lymph nodes and multiple subcutaneous/muscular nodules diagnosed in January 2024) on palliative systemic chemo, depression, GERD, HLD.  She presented with acute onset of left lower extremity weakness notably dragging her left leg starting on approximately Thursday.  She was recently seen approximately 1 week ago for new onset of headache and had no focal neurodeficits at that time. She was admitted for stroke workup.  MRI brain was performed which showed small scattered acute and subacute infarcts in the bilateral cerebral hemisphere.  Interval History:  No events overnight.  Husband present bedside this morning and update given with questions answered.  She is still having intermittent headache.  Not full relief from Fioricet.  We will continue trying to rotate with Reglan/Benadryl.  Assessment and Plan: * Acute CVA (cerebrovascular accident) [facility]) - Presented with left lower extremity weakness onset approximately Thursday.  Admitted for stroke workup - MRI brain revealing small scattered acute and subacute infarcts in the bilateral cerebral hemisphere; enhancing nodule in the right frontal cortex suggesting subacute infarct but recommended for follow-up 6 to 8 weeks given her underlying malignancy - neurology consulted - LDL 61 (on Crestor). A1c 6% - PT/OT recommending CIR - evaluated by SLP. Continue dysphagia 3 diet; follow-up modified barium swallow results today -Stroke workup complete.  Neurology referral at discharge with repeat MRI brain Ione contrast in 6-8 weeks to monitor for any metastatic disease - continue CIR workup - continue eliquis  (neuro has discussed with oncology)  Primary hypertension - Holding valsartan and hydrochlorothiazide due to low BP  Hoarseness - Chronic, due to cancer and oncologic treatments  Adenocarcinoma of left lung, stage 4 ([place]) - follows with Dr. Julien Nordmann; diagnosed Jan 2024 - Stage IV (T1c, N3, M1 C) non-small cell lung cancer, adenocarcinoma presented with left upper lobe metastatic neoplasm presented with left lower lobe lung nodule in addition to extensive lymphadenopathy involving the neck, left hilar and mediastinal lymphadenopathy as well as upper abdomen lymph nodes and multiple subcutaneous/muscular nodules diagnosed in January 2024.  -I have discussed with Dr. Julien Nordmann.  Chemo okay to resume after discharge from CIR  Anxiety and depression - Continue Xanax and Paxil - UDS shows benzos which is expected with patient having prescription for Xanax  GERD (gastroesophageal reflux disease) - Continue PPI  HLD (hyperlipidemia) - Continue Crestor - LDL 80   Old records reviewed in assessment of this patient  Antimicrobials:   DVT prophylaxis:  SCD's Start: 01/11/23 0446 apixaban (ELIQUIS) tablet 5 mg   Code Status:   Code Status: DNR  Mobility Assessment (last 72 hours)     Mobility Assessment     Row Name 01/13/23 1500 01/13/23 1327 01/12/23 2000 01/12/23 1630 01/12/23 1230   Does patient have an order for bedrest or is patient medically unstable -- -- No - Continue assessment No - Continue assessment No - Continue assessment   What is the highest level of mobility based on the progressive mobility assessment? Level 3 (Stands with assist) - Balance while standing  and cannot march in place Level 3 (Stands with assist) - Balance while standing  and cannot march in place Level 3 (Stands with assist) -  Balance while standing  and cannot march in place Level 3 (Stands with assist) - Balance while standing  and cannot march in place Level 3 (Stands with assist) - Balance while  standing  and cannot march in place   Is the above level different from baseline mobility prior to current illness? -- -- Yes - Recommend PT order -- --    Row Name 01/12/23 0951 01/12/23 0930 01/11/23 2000 01/11/23 1700     Does patient have an order for bedrest or is patient medically unstable -- No - Continue assessment No - Continue assessment --    What is the highest level of mobility based on the progressive mobility assessment? Level 3 (Stands with assist) - Balance while standing  and cannot march in place Level 3 (Stands with assist) - Balance while standing  and cannot march in place Level 3 (Stands with assist) - Balance while standing  and cannot march in place Level 3 (Stands with assist) - Balance while standing  and cannot march in place             Barriers to discharge:  Disposition Plan:  CIR Status is: Inpatient  Objective: Blood pressure 103/60, pulse 66, temperature 98.1 F (36.7 C), temperature source Oral, resp. rate 20, height 4\' 10"  (1.473 m), weight 63 kg, SpO2 94 %.  Examination:  Physical Exam Constitutional:      General: She is not in acute distress.    Appearance: Normal appearance.     Comments: Whispering voice (baseline)  HENT:     Head: Normocephalic and atraumatic.     Mouth/Throat:     Mouth: Mucous membranes are moist.  Eyes:     Extraocular Movements: Extraocular movements intact.  Cardiovascular:     Rate and Rhythm: Normal rate and regular rhythm.  Pulmonary:     Effort: No respiratory distress.     Breath sounds: Normal breath sounds. No wheezing.  Abdominal:     General: Bowel sounds are normal. There is no distension.     Palpations: Abdomen is soft.     Tenderness: There is no abdominal tenderness.  Musculoskeletal:        General: Normal range of motion.     Cervical back: Normal range of motion and neck supple.  Skin:    General: Skin is warm and dry.  Neurological:     Mental Status: She is alert.     Comments: Improved  LLE weakness (now 4/5 from 3+/5); improved LUE weakness 4+/5. No paresthesias.   Psychiatric:        Mood and Affect: Mood normal.        Behavior: Behavior normal.      Consultants:  Neurology  Procedures:    Data Reviewed: No results found for this or any previous visit (from the past 24 hour(s)).   I have reviewed pertinent nursing notes, vitals, labs, and images as necessary. I have ordered labwork to follow up on as indicated.  I have reviewed the last notes from staff over past 24 hours. I have discussed patient's care plan and test results with nursing staff, CM/SW, and other staff as appropriate.  Time spent: Greater than 50% of the 55 minute visit was spent in counseling/coordination of care for the patient as laid out in the A&P.   LOS: 3 days   Dwyane Dee, MD [facility] 01/14/2023, 12:58 PM

## 2023-01-14 NOTE — Progress Notes (Addendum)
Mobility Specialist Progress Note   01/14/23 1450  Mobility  Activity Transferred to/from [facility];Transferred from bed to chair  Level of Assistance Moderate assist, patient does 50-74% (+2)  Assistive Device Other (Comment) (HHA)  Range of Motion/Exercises Active;All extremities  Activity Response Tolerated well   Patient received in supine, requesting assistance to [facility]. Agreeable to sit in recliner chair after BM. Required mod A + verbal cues to use bed rail for bed mobility. Stood with mod A +2 and took shimmy like steps to her right side. Performed pericare while seated on BSC with assistance of NT. Patient then stood with mod-max A from [facility] and pivoted to recliner chair. Tolerated without complaint or incident. Was left in recliner with all needs met, chair alarm set, and call bell in reach.   [place] Kayte Borchard, BS EXP Mobility Specialist Please contact via SecureChat or Rehab office at [phone]

## 2023-01-14 NOTE — Progress Notes (Signed)
Inpatient Rehab Admissions Coordinator:   Dr. Sabino Gasser able to speak with oncology who agreed okay to hold chemo until after CIR. I requested insurance auth be started today.  Will follow.   Shann Medal, PT, DPT Admissions Coordinator [phone] 01/14/23  10:05 AM

## 2023-01-14 NOTE — Plan of Care (Signed)
  Problem: Education: Goal: Knowledge of disease or condition will improve Outcome: Progressing   Problem: Coping: Goal: Will identify appropriate support needs Outcome: Progressing   Problem: Education: Goal: Knowledge of General Education information will improve Description: Including pain rating scale, medication(s)/side effects and non-pharmacologic comfort measures Outcome: Progressing   Problem: Health Behavior/Discharge Planning: Goal: Ability to manage health-related needs will improve Outcome: Not Progressing   Problem: Nutrition: Goal: Risk of aspiration will decrease Outcome: Not Progressing   Problem: Coping: Goal: Level of anxiety will decrease Outcome: Not Progressing

## 2023-01-15 DIAGNOSIS — C3492 Malignant neoplasm of unspecified part of left bronchus or lung: Secondary | ICD-10-CM | POA: Diagnosis not present

## 2023-01-15 DIAGNOSIS — I639 Cerebral infarction, unspecified: Secondary | ICD-10-CM | POA: Diagnosis not present

## 2023-01-15 DIAGNOSIS — I10 Essential (primary) hypertension: Secondary | ICD-10-CM | POA: Diagnosis not present

## 2023-01-15 MED ORDER — PANTOPRAZOLE SODIUM 40 MG PO TBEC
40.0000 mg | DELAYED_RELEASE_TABLET | Freq: Two times a day (BID) | ORAL | Status: DC
  Administered 2023-01-15 – 2023-01-17 (×4): 40 mg via ORAL
  Filled 2023-01-15 (×4): qty 1

## 2023-01-15 MED ORDER — ALUM & MAG HYDROXIDE-SIMETH 200-200-20 MG/5ML PO SUSP
15.0000 mL | ORAL | Status: DC | PRN
  Filled 2023-01-15 (×2): qty 30

## 2023-01-15 NOTE — Progress Notes (Signed)
Inpatient Rehab Admissions Coordinator:   Awaiting determination from [facility] regarding CIR prior auth request.  I will not have a bed available for this patient to admit today.  Will follow.   Shann Medal, PT, DPT Admissions Coordinator [phone] 01/15/23  9:54 AM

## 2023-01-15 NOTE — Progress Notes (Signed)
PROGRESS NOTE    Patricia [name]  FGH:[id number] DOB: 1946/04/08 DOA: 01/10/2023 PCP: Curt Bears, MD   Brief Narrative:   77 yo female with PMH Stage IV NSCLC adenocarcinoma (extensive lymphadenopathy involving the neck, left hilar and mediastinal lymphadenopathy as well as upper abdomen lymph nodes and multiple subcutaneous/muscular nodules diagnosed in January 2024) on palliative systemic chemo, depression, GERD, HLD presented with left lower extremity weakness and was admitted for stroke workup.  MRI brain showed small scattered acute and subacute infarcts in the bilateral cerebral hemisphere.  Neurology was consulted.  Currently on Eliquis.  PT recommended CIR placement.  Currently medically stable for CIR discharge.  Assessment & Plan:   Acute CVA Dysphagia Hyperlipidemia -Presented with left lower extremity weakness. -MRI brain showed small scattered acute and subacute infarcts in the bilateral cerebral hemisphere.  Neurology was consulted.  Currently on Eliquis.  PT recommended CIR placement.  Currently medically stable for CIR discharge.  Outpatient follow-up with neurology -Diet as per SLP recommendations -A1c 6%.  LDL 61.  Currently on Crestor. -Echo showed EF of 60 to 65% with grade 1 diastolic dysfunction  Hypertension -Blood pressure on the lower side.  Valsartan and hydrochlorothiazide on hold.  Also allowing for permissive hypertension  Hoarseness -Chronic: Due to cancer and oncology treatments.  Stage IV adenocarcinoma of left lung - follows with Dr. Julien Nordmann; diagnosed Jan 2024 - Stage IV (T1c, N3, M1 C) non-small cell lung cancer, adenocarcinoma presented with left upper lobe metastatic neoplasm presented with left lower lobe lung nodule in addition to extensive lymphadenopathy involving the neck, left hilar and mediastinal lymphadenopathy as well as upper abdomen lymph nodes and multiple subcutaneous/muscular nodules diagnosed in January 2024.  -Prior hospitalist  discussed with Dr. Julien Nordmann.  Chemo okay to resume after discharge from CIR  Anxiety and depression--continue Xanax and Paxil  GERD -Continue PPI  DVT prophylaxis: Eliquis Code Status: DNR Family Communication: Husband at bedside Disposition Plan: Status is: Inpatient Remains inpatient appropriate because: Of severity of illness.  Need for CIR placement.  Currently medically stable for CIR discharge  Consultants: Neurology  Procedures: Echo  Antimicrobials: None   Subjective: Patient seen and examined at bedside.  Denies any fever, nausea, vomiting.  Objective: Vitals:   01/15/23 0200 01/15/23 0353 01/15/23 0400 01/15/23 0800  BP:    91/66  Pulse:  65  73  Resp: 17 18 18 16   Temp:  98.3 F (36.8 C)  98 F (36.7 C)  TempSrc:  Oral  Oral  SpO2:  95%  94%  Weight:      Height:        Intake/Output Summary (Last 24 hours) at 01/15/2023 1014 Last data filed at 01/15/2023 0800 Gross per 24 hour  Intake 480 ml  Output --  Net 480 ml   Filed Weights   01/10/23 1729  Weight: 63 kg    Examination:  General exam: Appears calm and comfortable.  On room air.  No distress.  Hoarse/whispering voice. Respiratory system: Bilateral decreased breath sounds at bases Cardiovascular system: S1 & S2 heard, Rate controlled Gastrointestinal system: Abdomen is nondistended, soft and nontender. Normal bowel sounds heard. Extremities: No cyanosis, clubbing, edema    Data Reviewed: I have personally reviewed following labs and imaging studies  CBC: Recent Labs  Lab 01/10/23 1800 01/11/23 0644  WBC 5.0 4.9  NEUTROABS 1.6*  --   HGB 10.5* 10.6*  HCT 32.4* 32.0*  MCV 87.3 86.5  PLT 76* 75*   Basic Metabolic Panel:  Recent Labs  Lab 01/10/23 1800 01/11/23 0644  NA 139 138  K 3.6 3.6  CL 103 103  CO2 27 28  GLUCOSE 95 110*  BUN 12 13  CREATININE 0.71 0.84  CALCIUM 9.1 9.2   GFR: Estimated Creatinine Clearance: 45.5 mL/min (by C-G formula based on SCr of 0.84  mg/dL). Liver Function Tests: Recent Labs  Lab 01/10/23 1800 01/11/23 0644  AST 19 17  ALT 16 16  ALKPHOS 76 69  BILITOT 0.7 0.6  PROT 6.3* 5.9*  ALBUMIN 3.4* 3.3*   No results for input(s): "LIPASE", "AMYLASE" in the last 168 hours. No results for input(s): "AMMONIA" in the last 168 hours. Coagulation Profile: Recent Labs  Lab 01/10/23 1800  INR 1.2   Cardiac Enzymes: No results for input(s): "CKTOTAL", "CKMB", "CKMBINDEX", "TROPONINI" in the last 168 hours. BNP (last 3 results) No results for input(s): "PROBNP" in the last 8760 hours. HbA1C: No results for input(s): "HGBA1C" in the last 72 hours. CBG: Recent Labs  Lab 01/10/23 1723 01/14/23 2044  GLUCAP 106* 104*   Lipid Profile: No results for input(s): "CHOL", "HDL", "LDLCALC", "TRIG", "CHOLHDL", "LDLDIRECT" in the last 72 hours. Thyroid Function Tests: No results for input(s): "TSH", "T4TOTAL", "FREET4", "T3FREE", "THYROIDAB" in the last 72 hours. Anemia Panel: No results for input(s): "VITAMINB12", "FOLATE", "FERRITIN", "TIBC", "IRON", "RETICCTPCT" in the last 72 hours. Sepsis Labs: No results for input(s): "PROCALCITON", "LATICACIDVEN" in the last 168 hours.  No results found for this or any previous visit (from the past 240 hour(s)).       Radiology Studies: No results found.      Scheduled Meds:  apixaban  5 mg Oral BID   folic acid  1 mg Oral Daily   multivitamin with minerals  1 tablet Oral Daily   pantoprazole  20 mg Oral Daily   PARoxetine  40 mg Oral q AM   rosuvastatin  20 mg Oral Daily   Continuous Infusions:        Aline August, MD [facility] 01/15/2023, 10:14 AM

## 2023-01-15 NOTE — Progress Notes (Signed)
Copy of FMLA forms mailed to Patient as requested. Forms faxed to number provided and fax transmission confirmation received.

## 2023-01-15 NOTE — Plan of Care (Signed)
  Problem: Education: Goal: Knowledge of disease or condition will improve Outcome: Progressing   Problem: Coping: Goal: Will verbalize positive feelings about self Outcome: Progressing   Problem: Nutrition: Goal: Dietary intake will improve Outcome: Progressing   Problem: Education: Goal: Knowledge of General Education information will improve Description: Including pain rating scale, medication(s)/side effects and non-pharmacologic comfort measures Outcome: Progressing   Problem: Health Behavior/Discharge Planning: Goal: Ability to manage health-related needs will improve Outcome: Not Progressing   Problem: Activity: Goal: Risk for activity intolerance will decrease Outcome: Not Progressing

## 2023-01-15 NOTE — Progress Notes (Signed)
Physical Therapy Treatment Patient Details Name: Patricia [name] MRN: [id number] DOB: 1946/02/03 Today's Date: 01/15/2023   History of Present Illness 77 y.o. female presents the ED 01/10/23 with a chief complaint of left leg weakness. Also with recent vertigo and fall due to vertigo. MRI  Small scattered acute and subacute infarcts in the bilateral cerebral hemisphere suggesting central embolic disease. Enhancing nodule in the right frontal cortex is most likely a subacute infarct  but recommend follow-up in 6-8 weeks given active malignancy.  PMH significant of depression, GERD, hyperlipidemia, non-small cell lung cancer with last treatment being January 22,    PT Comments    Pt making steady progress towards her physical therapy goals and remains motivated to participate. Able to progress taking steps today, with tactile/verbal cueing for midline positioning, weight shifting, and stepping initiation/sequencing. Pt requiring two person min-mod assist overall for functional mobility. Continues to demonstrate impaired sitting/standing balance, L sided weakness, gait abnormalities, cognitive deficits. Would highly benefit from AIR to address deficits and maximize functional mobility. Suspect good progress given PLOF and motivation.   Recommendations for follow up therapy are one component of a multi-disciplinary discharge planning process, led by the attending physician.  Recommendations may be updated based on patient status, additional functional criteria and insurance authorization.  Follow Up Recommendations  Acute inpatient rehab (3hours/day)     Assistance Recommended at Discharge Frequent or constant Supervision/Assistance  Patient can return home with the following Two people to help with walking and/or transfers;Assistance with cooking/housework;Direct supervision/assist for medications management;Direct supervision/assist for financial management;Assist for transportation;Help with stairs  or ramp for entrance   Equipment Recommendations  Wheelchair (measurements PT);Wheelchair cushion (measurements PT);Rolling walker (2 wheels)    Recommendations for Other Services       Precautions / Restrictions Precautions Precautions: Fall Restrictions Weight Bearing Restrictions: No     Mobility  Bed Mobility Overal bed mobility: Needs Assistance Bed Mobility: Supine to Sit     Supine to sit: HOB elevated, Min guard     General bed mobility comments: Close min guard assist, verbal cues for neutral/midline positioning    Transfers Overall transfer level: Needs assistance Equipment used: None Transfers: Sit to/from Stand Sit to Stand: Min assist, Mod assist, +2 safety/equipment           General transfer comment: Min-modA + 2 for standing from edge of bed and chair multiple times. Cues for keeping L foot flat on floor    Ambulation/Gait Ambulation/Gait assistance: Mod assist, +2 safety/equipment Gait Distance (Feet): 5 Feet Assistive device: Rolling walker (2 wheels) Gait Pattern/deviations: Step-to pattern, Decreased stance time - left, Decreased weight shift to left Gait velocity: decreased Gait velocity interpretation: <1.8 ft/sec, indicate of risk for recurrent falls   General Gait Details: Tactile/verbal cues for neutral positioning, weight shifting, bigger L step, keeping L foot flat on floor. Increased L lateral lean with distractions, requiring up to modA.   Stairs             Wheelchair Mobility    Modified Rankin (Stroke Patients Only) Modified Rankin (Stroke Patients Only) Pre-Morbid Rankin Score: No symptoms Modified Rankin: Moderately severe disability     Balance Overall balance assessment: Needs assistance Sitting-balance support: Single extremity supported, Feet supported Sitting balance-Leahy Scale: Poor Sitting balance - Comments: left lean   Standing balance support: Bilateral upper extremity supported, Reliant on  assistive device for balance Standing balance-Leahy Scale: Poor Standing balance comment: left and posterior lean  Cognition Arousal/Alertness: Awake/alert Behavior During Therapy: WFL for tasks assessed/performed Overall Cognitive Status: Impaired/Different from baseline Area of Impairment: Attention, Safety/judgement, Awareness, Problem solving                   Current Attention Level: Selective     Safety/Judgement: Decreased awareness of safety, Decreased awareness of deficits Awareness: Emergent Problem Solving: Decreased initiation, Requires verbal cues, Requires tactile cues, Difficulty sequencing General Comments: needs cueing for attention to tasks, easily distractable and verbose        Exercises Other Exercises Other Exercises: Static standing at sink using mirror for visual cueing to achieve midline posture Other Exercises: x5 serial sit to stands with tactile facilitation through LLE to promote foot flat and keep even with RLE to incorporate increased weight bearing    General Comments        Pertinent Vitals/Pain Pain Assessment Pain Assessment: Faces Faces Pain Scale: Hurts little more Pain Location: headache, indigestion, distal LLE (cramping) Pain Descriptors / Indicators: Aching, Cramping Pain Intervention(s): Monitored during session    Home Living                          Prior Function            PT Goals (current goals can now be found in the care plan section) Acute Rehab PT Goals Potential to Achieve Goals: Good Progress towards PT goals: Progressing toward goals    Frequency    Min 3X/week      PT Plan Current plan remains appropriate    Co-evaluation PT/OT/SLP Co-Evaluation/Treatment: Yes Reason for Co-Treatment: For patient/therapist safety;To address functional/ADL transfers PT goals addressed during session: Mobility/safety with mobility        AM-PAC PT "6 Clicks"  Mobility   Outcome Measure  Help needed turning from your back to your side while in a flat bed without using bedrails?: A Little Help needed moving from lying on your back to sitting on the side of a flat bed without using bedrails?: A Little Help needed moving to and from a bed to a chair (including a wheelchair)?: A Lot Help needed standing up from a chair using your arms (e.g., wheelchair or bedside chair)?: A Lot Help needed to walk in hospital room?: Total Help needed climbing 3-5 steps with a railing? : Total 6 Click Score: 12    End of Session Equipment Utilized During Treatment: Gait belt Activity Tolerance: Patient tolerated treatment well Patient left: with call bell/phone within reach;in chair;with chair alarm set Nurse Communication: Mobility status PT Visit Diagnosis: Hemiplegia and hemiparesis Hemiplegia - Right/Left: Left Hemiplegia - dominant/non-dominant: Non-dominant Hemiplegia - caused by: Cerebral infarction     Time: 6203-5597 PT Time Calculation (min) (ACUTE ONLY): 26 min  Charges:  $Therapeutic Activity: 8-22 mins                     Wyona Almas, PT, DPT Acute Rehabilitation Services Office [place] 01/15/2023, 1:01 PM

## 2023-01-15 NOTE — Progress Notes (Signed)
Occupational Therapy Treatment Patient Details Name: Patricia [name] MRN: [id number] DOB: 11-Jan-1946 Today's Date: 01/15/2023   History of present illness 77 y.o. female presents the ED 01/10/23 with a chief complaint of left leg weakness. Also with recent vertigo and fall due to vertigo. MRI  Small scattered acute and subacute infarcts in the bilateral cerebral hemisphere suggesting central embolic disease. Enhancing nodule in the right frontal cortex is most likely a subacute infarct  but recommend follow-up in 6-8 weeks given active malignancy.  PMH significant of depression, GERD, hyperlipidemia, non-small cell lung cancer with last treatment being January 22,   OT comments  Patient continues to make steady progress towards goals in skilled OT session. Patient's session encompassed advancement of functional mobility with PT using mirror to promote increased understanding of finding midline due to tendency for L lateral lean. Patient needing max verbal cues to maintain attention to task, with no ability to complete divided attention to date. Patient with improved functional mobility to use RW, with mod A of 2 from PT and OT to promote advancement of L leg. OT continues to highly recommend AIR placement due to current level of deficits.    Recommendations for follow up therapy are one component of a multi-disciplinary discharge planning process, led by the attending physician.  Recommendations may be updated based on patient status, additional functional criteria and insurance authorization.    Follow Up Recommendations  Acute inpatient rehab (3hours/day)     Assistance Recommended at Discharge Frequent or constant Supervision/Assistance  Patient can return home with the following  Two people to help with walking and/or transfers;Two people to help with bathing/dressing/bathroom;Assistance with cooking/housework;Direct supervision/assist for medications management;Direct supervision/assist for  financial management;Assist for transportation;Help with stairs or ramp for entrance   Equipment Recommendations  Other (comment) (defer to next venue)    Recommendations for Other Services      Precautions / Restrictions Precautions Precautions: Fall Restrictions Weight Bearing Restrictions: No       Mobility Bed Mobility Overal bed mobility: Needs Assistance Bed Mobility: Supine to Sit     Supine to sit: HOB elevated, Min guard     General bed mobility comments: Close min guard assist, verbal cues for neutral/midline positioning    Transfers Overall transfer level: Needs assistance Equipment used: None Transfers: Sit to/from Stand Sit to Stand: Min assist, Mod assist, +2 safety/equipment           General transfer comment: Min-modA + 2 for standing from edge of bed and chair multiple times. Cues for keeping L foot flat on floor     Balance Overall balance assessment: Needs assistance Sitting-balance support: Single extremity supported, Feet supported Sitting balance-Leahy Scale: Poor Sitting balance - Comments: left lean   Standing balance support: Bilateral upper extremity supported, Reliant on assistive device for balance Standing balance-Leahy Scale: Poor Standing balance comment: left and posterior lean                           ADL either performed or assessed with clinical judgement   ADL Overall ADL's : Needs assistance/impaired     Grooming: Set up;Sitting                   Toilet Transfer: Moderate assistance;+2 for physical assistance;+2 for safety/equipment Toilet Transfer Details (indicate cue type and reason): completing functional mobility with max cues to advance L leg and assist from OT and PT to safely advance gait with RW  Functional mobility during ADLs: Moderate assistance;+2 for physical assistance;+2 for safety/equipment General ADL Comments: Session focus on increasing standing balance, functional  mobility,  and awareness of midline with use of mirror    Extremity/Trunk Assessment              Vision       Perception     Praxis      Cognition Arousal/Alertness: Awake/alert Behavior During Therapy: WFL for tasks assessed/performed Overall Cognitive Status: Impaired/Different from baseline Area of Impairment: Attention, Safety/judgement, Awareness, Problem solving                   Current Attention Level: Selective     Safety/Judgement: Decreased awareness of safety, Decreased awareness of deficits Awareness: Emergent Problem Solving: Decreased initiation, Requires verbal cues, Requires tactile cues, Difficulty sequencing General Comments: needs cueing for attention to tasks, easily distractable and verbose        Exercises Exercises: Other exercises Other Exercises Other Exercises: Static standing at sink using mirror for visual cueing to achieve midline posture Other Exercises: x5 serial sit to stands with tactile facilitation through LLE to promote foot flat and keep even with RLE to incorporate increased weight bearing    Shoulder Instructions       General Comments      Pertinent Vitals/ Pain       Pain Assessment Pain Assessment: Faces Faces Pain Scale: Hurts little more Pain Location: headache, indigestion, distal LLE (cramping) Pain Descriptors / Indicators: Aching, Cramping Pain Intervention(s): Monitored during session, Repositioned  Home Living                                          Prior Functioning/Environment              Frequency  Min 2X/week        Progress Toward Goals  OT Goals(current goals can now be found in the care plan section)  Progress towards OT goals: Progressing toward goals  Acute Rehab OT Goals Patient Stated Goal: to get better OT Goal Formulation: With patient Time For Goal Achievement: 01/25/23 Potential to Achieve Goals: Good  Plan Discharge plan remains appropriate     Co-evaluation      Reason for Co-Treatment: For patient/therapist safety;To address functional/ADL transfers PT goals addressed during session: Mobility/safety with mobility OT goals addressed during session: ADL's and self-care      AM-PAC OT "6 Clicks" Daily Activity     Outcome Measure   Help from another person eating meals?: None Help from another person taking care of personal grooming?: A Little Help from another person toileting, which includes using toliet, bedpan, or urinal?: A Lot Help from another person bathing (including washing, rinsing, drying)?: A Lot Help from another person to put on and taking off regular upper body clothing?: A Little Help from another person to put on and taking off regular lower body clothing?: A Lot 6 Click Score: 16    End of Session Equipment Utilized During Treatment: Gait belt;Rolling walker (2 wheels)  OT Visit Diagnosis: Unsteadiness on feet (R26.81);Muscle weakness (generalized) (M62.81);Other abnormalities of gait and mobility (R26.89);History of falling (Z91.81);Other symptoms and signs involving cognitive function   Activity Tolerance Patient tolerated treatment well   Patient Left in chair;with call bell/phone within reach;with chair alarm set   Nurse Communication Mobility status        Time: [id number]  OT Time Calculation (min): 26 min  Charges: OT General Charges $OT Visit: 1 Visit OT Treatments $Self Care/Home Management : 8-22 mins  Corinne Ports E. Jendaya Gossett, OTR/L Acute Rehabilitation Services [phone]   Ascencion Dike 01/15/2023, 4:19 PM

## 2023-01-16 DIAGNOSIS — C3492 Malignant neoplasm of unspecified part of left bronchus or lung: Secondary | ICD-10-CM | POA: Diagnosis not present

## 2023-01-16 DIAGNOSIS — I639 Cerebral infarction, unspecified: Secondary | ICD-10-CM | POA: Diagnosis not present

## 2023-01-16 DIAGNOSIS — R490 Dysphonia: Secondary | ICD-10-CM | POA: Diagnosis not present

## 2023-01-16 MED ORDER — IBUPROFEN 200 MG PO TABS
600.0000 mg | ORAL_TABLET | Freq: Once | ORAL | Status: AC
  Administered 2023-01-16: 600 mg via ORAL
  Filled 2023-01-16: qty 3

## 2023-01-16 NOTE — Progress Notes (Addendum)
Speech Language Pathology Treatment: Dysphagia  Patient Details Name: DONNIS PECHA MRN: [id number] DOB: Aug 25, 1946 Today's Date: 01/16/2023 Time: 8032-1224 SLP Time Calculation (min) (ACUTE ONLY): 16 min  Assessment / Plan / Recommendation Clinical Impression  Pt seen for f/u swallowing/cognitive tx focusing on education re: swallowing/safety precautions with pt able to recall 75% of precautions for PO intake with min verbal cues provided for portion of swallowing/aspiration precautions regarding consuming mixed consistencies with separation.  Pt stated "I had trouble with sausage and indigestion this morning."  Pt required min verbal cues to recall safety precautions re: mobility when asked, but able to identify most swallowing precautions when questioned.  Recommend continue current diet with swallowing precautions in place and ST will continue to f/u for cognitive/linguistic and dysphagia goals until transfer to CIR.    HPI HPI: Ms. Bussie is a 77 yo female with PMH Stage IV NSCLC adenocarcinoma (extensive lymphadenopathy involving the neck, left hilar and mediastinal lymphadenopathy as well as upper abdomen lymph nodes and multiple subcutaneous/muscular nodules diagnosed in January 2024) on palliative systemic chemo, depression, GERD, HLD. She presented with acute onset of left lower extremity weakness notably dragging her left leg starting on approximately Thursday. She was recently seen approximately 1 week ago for new onset of headache and had no focal neurodeficits at that time. She was admitted for stroke workup. MRI brain was performed which showed small scattered acute and subacute infarcts in the bilateral cerebral hemisphere; MRI 01/11/23 revealed Small scattered acute and subacute infarcts in the bilateral cerebral hemisphere suggesting central embolic disease.  MBS completed with Reg/thin recommended with swallowing precautions. Pt denoted she "got choked on Frosted flakes" this morning.       SLP Plan  Continue with current plan of care (imminent d/c to CIR)      Recommendations for follow up therapy are one component of a multi-disciplinary discharge planning process, led by the attending physician.  Recommendations may be updated based on patient status, additional functional criteria and insurance authorization.    Recommendations  Diet recommendations: Mechanical soft (D3);Thin liquid Liquids provided via: Cup;Straw (small sips) Medication Administration: Whole meds with liquid (or large with puree) Supervision: Patient able to self feed;Intermittent supervision to cue for compensatory strategies Compensations: Slow rate;Small sips/bites;Minimize environmental distractions;Other (Comment) (esophageal precautions) Postural Changes and/or Swallow Maneuvers: Seated upright 90 degrees;Upright 30-60 min after meal                General recommendations: Rehab consult Oral Care Recommendations: Oral care BID;Patient independent with oral care Follow Up Recommendations: Acute inpatient rehab (3hours/day) Assistance recommended at discharge: Frequent or constant Supervision/Assistance SLP Visit Diagnosis: Dysphagia, unspecified (R13.10);Attention and concentration deficit;Cognitive communication deficit (R41.841) Attention and concentration deficit following: Cerebral infarction Plan: Continue with current plan of care (imminent d/c to CIR)           Pat Bettymae Yott,M.S., CCC-SLP  01/16/2023, 10:07 AM

## 2023-01-16 NOTE — Progress Notes (Signed)
Inpatient Rehab Admissions Coordinator:   Awaiting determination from [facility] regarding CIR prior auth request.  I do not believe I will have a bed for this patient to admit today, but pending approval may be able to admit tomorrow vs over weekend.  Will follow.   Shann Medal, PT, DPT Admissions Coordinator [phone] 01/16/23  9:58 AM

## 2023-01-16 NOTE — Progress Notes (Signed)
Physical Therapy Treatment Patient Details Name: Patricia [name] MRN: [id number] DOB: 1946-01-09 Today's Date: 01/16/2023   History of Present Illness 77 y.o. female presents the ED 01/10/23 with a chief complaint of left leg weakness. Also with recent vertigo and fall due to vertigo. MRI  Small scattered acute and subacute infarcts in the bilateral cerebral hemisphere suggesting central embolic disease. Enhancing nodule in the right frontal cortex is most likely a subacute infarct  but recommend follow-up in 6-8 weeks given active malignancy.  PMH significant of depression, GERD, hyperlipidemia, non-small cell lung cancer with last treatment being January 22,    PT Comments    Patient highly motivated and making good progress towards goals. Able to progress ambulation with RW 72ft x2 with mod assist for balance and max cues for sequencing (pt easily distracts herself and forgets to lead with her LLE). Patient will do well with incr therapies on AIR unit.    Recommendations for follow up therapy are one component of a multi-disciplinary discharge planning process, led by the attending physician.  Recommendations may be updated based on patient status, additional functional criteria and insurance authorization.  Follow Up Recommendations  Acute inpatient rehab (3hours/day)     Assistance Recommended at Discharge Frequent or constant Supervision/Assistance  Patient can return home with the following Two people to help with walking and/or transfers;Assistance with cooking/housework;Direct supervision/assist for medications management;Direct supervision/assist for financial management;Assist for transportation;Help with stairs or ramp for entrance   Equipment Recommendations  Wheelchair (measurements PT);Wheelchair cushion (measurements PT);Rolling walker (2 wheels)    Recommendations for Other Services       Precautions / Restrictions Precautions Precautions: Fall Restrictions Weight Bearing  Restrictions: No     Mobility  Bed Mobility               General bed mobility comments: pt up in chair    Transfers Overall transfer level: Needs assistance Equipment used: Rolling walker (2 wheels) Transfers: Sit to/from Stand Sit to Stand: Min assist           General transfer comment: vc for sequencing; assist for anterior shift over BOS    Ambulation/Gait Ambulation/Gait assistance: Mod assist, +2 safety/equipment Gait Distance (Feet): 5 Feet (x2) Assistive device: Rolling walker (2 wheels) Gait Pattern/deviations: Step-to pattern, Decreased stance time - left, Decreased weight shift to right, Knee hyperextension - left, Narrow base of support Gait velocity: decreased Gait velocity interpretation: <1.8 ft/sec, indicate of risk for recurrent falls   General Gait Details: Tactile/verbal cues for neutral positioning, weight shifting to rt, bigger L step Increased L lateral lean with distractions, requiring up to modA. Chair follow   Stairs             Wheelchair Mobility    Modified Rankin (Stroke Patients Only) Modified Rankin (Stroke Patients Only) Pre-Morbid Rankin Score: No symptoms Modified Rankin: Moderately severe disability     Balance Overall balance assessment: Needs assistance Sitting-balance support: Single extremity supported, Feet supported Sitting balance-Leahy Scale: Poor Sitting balance - Comments: left lean   Standing balance support: Bilateral upper extremity supported, Reliant on assistive device for balance Standing balance-Leahy Scale: Poor Standing balance comment: left and posterior lean                            Cognition Arousal/Alertness: Awake/alert Behavior During Therapy: WFL for tasks assessed/performed Overall Cognitive Status: Impaired/Different from baseline Area of Impairment: Attention, Safety/judgement, Awareness, Problem solving  Current Attention Level: Selective      Safety/Judgement: Decreased awareness of safety, Decreased awareness of deficits Awareness: Emergent Problem Solving: Requires verbal cues, Requires tactile cues, Difficulty sequencing General Comments: needs cueing for attention to tasks, easily distractable and verbose; poor awareness of midline and when she is losing her balance left or posterior        Exercises Other Exercises Other Exercises: ankle DF x 5 reps, emphasizing LLE    General Comments        Pertinent Vitals/Pain Pain Assessment Pain Assessment: Faces Faces Pain Scale: Hurts little more Breathing: normal Negative Vocalization: none Facial Expression: smiling or inexpressive Body Language: relaxed Consolability: no need to console PAINAD Score: 0 Pain Location: headache Pain Descriptors / Indicators: Aching Pain Intervention(s): Limited activity within patient's tolerance    Home Living                          Prior Function            PT Goals (current goals can now be found in the care plan section) Acute Rehab PT Goals Patient Stated Goal: get stronger Time For Goal Achievement: 01/25/23 Potential to Achieve Goals: Good Progress towards PT goals: Progressing toward goals    Frequency    Min 3X/week      PT Plan Current plan remains appropriate    Co-evaluation              AM-PAC PT "6 Clicks" Mobility   Outcome Measure  Help needed turning from your back to your side while in a flat bed without using bedrails?: A Little Help needed moving from lying on your back to sitting on the side of a flat bed without using bedrails?: A Little Help needed moving to and from a bed to a chair (including a wheelchair)?: A Lot Help needed standing up from a chair using your arms (e.g., wheelchair or bedside chair)?: A Lot Help needed to walk in hospital room?: Total Help needed climbing 3-5 steps with a railing? : Total 6 Click Score: 12    End of Session Equipment Utilized  During Treatment: Gait belt Activity Tolerance: Patient tolerated treatment well Patient left: with call bell/phone within reach;in chair;with family/visitor present (pt verbalizes she will not get up alone (noted chair alarm pad not under pt at end of session))   PT Visit Diagnosis: Hemiplegia and hemiparesis Hemiplegia - Right/Left: Left Hemiplegia - dominant/non-dominant: Non-dominant Hemiplegia - caused by: Cerebral infarction     Time: 1259-1318 PT Time Calculation (min) (ACUTE ONLY): 19 min  Charges:  $Gait Training: 8-22 mins                      [place]  Office [phone]    Rexanne Mano 01/16/2023, 1:29 PM

## 2023-01-16 NOTE — Progress Notes (Signed)
PROGRESS NOTE    Patricia [name]  KDX:[id number] DOB: Nov 13, 1946 DOA: 01/10/2023 PCP: Curt Bears, MD   Brief Narrative:   77 yo female with PMH Stage IV NSCLC adenocarcinoma (extensive lymphadenopathy involving the neck, left hilar and mediastinal lymphadenopathy as well as upper abdomen lymph nodes and multiple subcutaneous/muscular nodules diagnosed in January 2024) on palliative systemic chemo, depression, GERD, HLD presented with left lower extremity weakness and was admitted for stroke workup.  MRI brain showed small scattered acute and subacute infarcts in the bilateral cerebral hemisphere.  Neurology was consulted.  Currently on Eliquis.  PT recommended CIR placement.  Currently medically stable for CIR discharge.  Assessment & Plan:   Acute CVA Dysphagia Hyperlipidemia -Presented with left lower extremity weakness. -MRI brain showed small scattered acute and subacute infarcts in the bilateral cerebral hemisphere.  Neurology was consulted.  Currently on Eliquis.  PT recommended CIR placement.  Currently medically stable for CIR discharge.  Outpatient follow-up with neurology -Diet as per SLP recommendations -A1c 6%.  LDL 61.  Currently on Crestor. -Echo showed EF of 60 to 65% with grade 1 diastolic dysfunction  Hypertension -Blood pressure on the lower side.  Valsartan and hydrochlorothiazide on hold.  Also allowing for permissive hypertension  Hoarseness -Chronic: Due to cancer and oncology treatments.  Stage IV adenocarcinoma of left lung - follows with Dr. Julien Nordmann; diagnosed Jan 2024 - Stage IV (T1c, N3, M1 C) non-small cell lung cancer, adenocarcinoma presented with left upper lobe metastatic neoplasm presented with left lower lobe lung nodule in addition to extensive lymphadenopathy involving the neck, left hilar and mediastinal lymphadenopathy as well as upper abdomen lymph nodes and multiple subcutaneous/muscular nodules diagnosed in January 2024.  -Prior hospitalist  discussed with Dr. Julien Nordmann.  Chemo okay to resume after discharge from CIR  Anxiety and depression--continue Xanax and Paxil  GERD -Continue PPI  DVT prophylaxis: Eliquis Code Status: DNR Family Communication: Husband at bedside Disposition Plan: Status is: Inpatient Remains inpatient appropriate because: Of severity of illness.  Need for CIR placement.  Currently medically stable for CIR discharge  Consultants: Neurology  Procedures: Echo  Antimicrobials: None   Subjective: Patient seen and examined at bedside.  No chest pain, shortness of breath, vomiting or fever reported.  Objective: Vitals:   01/15/23 1943 01/15/23 2318 01/16/23 0324 01/16/23 0719  BP: 112/63 (!) 104/53 (!) 141/62 113/76  Pulse: 75 64 70 72  Resp: 16 16 16 17   Temp: 98.4 F (36.9 C) 98.4 F (36.9 C) 98.3 F (36.8 C) 98.5 F (36.9 C)  TempSrc: Oral Oral Oral Oral  SpO2: 95% 95% 94% 95%  Weight:      Height:        Intake/Output Summary (Last 24 hours) at 01/16/2023 0800 Last data filed at 01/16/2023 0400 Gross per 24 hour  Intake 300 ml  Output 300 ml  Net 0 ml    Filed Weights   01/10/23 1729  Weight: 63 kg    Examination:  General exam: Currently on room air.  No acute distress.  Hoarse/whispering voice. Respiratory system: Decreased breath sounds at bases bilaterally, no wheezing cardiovascular system: Rate mostly controlled; S1 and S2 are heard Gastrointestinal system: Abdomen is distended slightly; soft and nontender.  Bowel sounds normally heard  extremities: No edema or clubbing   Data Reviewed: I have personally reviewed following labs and imaging studies  CBC: Recent Labs  Lab 01/10/23 1800 01/11/23 0644  WBC 5.0 4.9  NEUTROABS 1.6*  --   HGB 10.5*  10.6*  HCT 32.4* 32.0*  MCV 87.3 86.5  PLT 76* 75*    Basic Metabolic Panel: Recent Labs  Lab 01/10/23 1800 01/11/23 0644  NA 139 138  K 3.6 3.6  CL 103 103  CO2 27 28  GLUCOSE 95 110*  BUN 12 13  CREATININE  0.71 0.84  CALCIUM 9.1 9.2    GFR: Estimated Creatinine Clearance: 45.5 mL/min (by C-G formula based on SCr of 0.84 mg/dL). Liver Function Tests: Recent Labs  Lab 01/10/23 1800 01/11/23 0644  AST 19 17  ALT 16 16  ALKPHOS 76 69  BILITOT 0.7 0.6  PROT 6.3* 5.9*  ALBUMIN 3.4* 3.3*    No results for input(s): "LIPASE", "AMYLASE" in the last 168 hours. No results for input(s): "AMMONIA" in the last 168 hours. Coagulation Profile: Recent Labs  Lab 01/10/23 1800  INR 1.2    Cardiac Enzymes: No results for input(s): "CKTOTAL", "CKMB", "CKMBINDEX", "TROPONINI" in the last 168 hours. BNP (last 3 results) No results for input(s): "PROBNP" in the last 8760 hours. HbA1C: No results for input(s): "HGBA1C" in the last 72 hours. CBG: Recent Labs  Lab 01/10/23 1723 01/14/23 2044  GLUCAP 106* 104*    Lipid Profile: No results for input(s): "CHOL", "HDL", "LDLCALC", "TRIG", "CHOLHDL", "LDLDIRECT" in the last 72 hours. Thyroid Function Tests: No results for input(s): "TSH", "T4TOTAL", "FREET4", "T3FREE", "THYROIDAB" in the last 72 hours. Anemia Panel: No results for input(s): "VITAMINB12", "FOLATE", "FERRITIN", "TIBC", "IRON", "RETICCTPCT" in the last 72 hours. Sepsis Labs: No results for input(s): "PROCALCITON", "LATICACIDVEN" in the last 168 hours.  No results found for this or any previous visit (from the past 240 hour(s)).       Radiology Studies: No results found.      Scheduled Meds:  apixaban  5 mg Oral BID   folic acid  1 mg Oral Daily   multivitamin with minerals  1 tablet Oral Daily   pantoprazole  40 mg Oral BID   PARoxetine  40 mg Oral q AM   rosuvastatin  20 mg Oral Daily   Continuous Infusions:        Aline August, MD [facility] 01/16/2023, 8:00 AM

## 2023-01-16 NOTE — Progress Notes (Signed)
Mobility Specialist: Progress Note   01/16/23 1205  Mobility  Activity Transferred from bed to chair  Level of Assistance Moderate assist, patient does 50-74%  Assistive Device Other (Comment) (HHA)  Distance Ambulated (ft) 2 ft  Activity Response Tolerated well  Mobility Referral Yes  $Mobility charge 1 Mobility   Pt received in the bed and agreeable to mobility. MinA with bed mobility and modA to stand and pivot to the chair. No c/o throughout. Pt is in the chair with call bell at her side.  [place] [employer] Mobility Specialist Please contact via SecureChat or Rehab office at [phone]

## 2023-01-16 NOTE — Plan of Care (Signed)

## 2023-01-17 ENCOUNTER — Encounter (HOSPITAL_COMMUNITY): Payer: Self-pay | Admitting: Physical Medicine & Rehabilitation

## 2023-01-17 ENCOUNTER — Other Ambulatory Visit: Payer: Self-pay

## 2023-01-17 ENCOUNTER — Inpatient Hospital Stay (HOSPITAL_COMMUNITY)
Admission: RE | Admit: 2023-01-17 | Discharge: 2023-02-05 | DRG: 057 | Disposition: A | Discharge status: Home Health | Payer: Medicare HMO | Source: Intra-hospital | Attending: Physical Medicine & Rehabilitation | Admitting: Physical Medicine & Rehabilitation

## 2023-01-17 ENCOUNTER — Inpatient Hospital Stay (HOSPITAL_COMMUNITY): Payer: Medicare HMO

## 2023-01-17 DIAGNOSIS — R591 Generalized enlarged lymph nodes: Secondary | ICD-10-CM | POA: Diagnosis present

## 2023-01-17 DIAGNOSIS — M79662 Pain in left lower leg: Secondary | ICD-10-CM | POA: Diagnosis not present

## 2023-01-17 DIAGNOSIS — I10 Essential (primary) hypertension: Secondary | ICD-10-CM | POA: Diagnosis present

## 2023-01-17 DIAGNOSIS — H532 Diplopia: Secondary | ICD-10-CM | POA: Diagnosis not present

## 2023-01-17 DIAGNOSIS — R52 Pain, unspecified: Secondary | ICD-10-CM | POA: Diagnosis not present

## 2023-01-17 DIAGNOSIS — I6349 Cerebral infarction due to embolism of other cerebral artery: Secondary | ICD-10-CM | POA: Diagnosis not present

## 2023-01-17 DIAGNOSIS — R519 Headache, unspecified: Secondary | ICD-10-CM

## 2023-01-17 DIAGNOSIS — F419 Anxiety disorder, unspecified: Secondary | ICD-10-CM | POA: Diagnosis present

## 2023-01-17 DIAGNOSIS — M542 Cervicalgia: Secondary | ICD-10-CM | POA: Diagnosis not present

## 2023-01-17 DIAGNOSIS — E7800 Pure hypercholesterolemia, unspecified: Secondary | ICD-10-CM | POA: Diagnosis present

## 2023-01-17 DIAGNOSIS — I639 Cerebral infarction, unspecified: Secondary | ICD-10-CM | POA: Diagnosis not present

## 2023-01-17 DIAGNOSIS — M79605 Pain in left leg: Secondary | ICD-10-CM | POA: Diagnosis not present

## 2023-01-17 DIAGNOSIS — I63133 Cerebral infarction due to embolism of bilateral carotid arteries: Secondary | ICD-10-CM | POA: Diagnosis not present

## 2023-01-17 DIAGNOSIS — R590 Localized enlarged lymph nodes: Secondary | ICD-10-CM | POA: Diagnosis not present

## 2023-01-17 DIAGNOSIS — Z885 Allergy status to narcotic agent status: Secondary | ICD-10-CM

## 2023-01-17 DIAGNOSIS — I69319 Unspecified symptoms and signs involving cognitive functions following cerebral infarction: Secondary | ICD-10-CM

## 2023-01-17 DIAGNOSIS — C3400 Malignant neoplasm of unspecified main bronchus: Secondary | ICD-10-CM | POA: Diagnosis present

## 2023-01-17 DIAGNOSIS — F32A Depression, unspecified: Secondary | ICD-10-CM | POA: Diagnosis present

## 2023-01-17 DIAGNOSIS — I69354 Hemiplegia and hemiparesis following cerebral infarction affecting left non-dominant side: Secondary | ICD-10-CM | POA: Diagnosis not present

## 2023-01-17 DIAGNOSIS — C3492 Malignant neoplasm of unspecified part of left bronchus or lung: Secondary | ICD-10-CM | POA: Diagnosis not present

## 2023-01-17 DIAGNOSIS — H9201 Otalgia, right ear: Secondary | ICD-10-CM | POA: Diagnosis not present

## 2023-01-17 DIAGNOSIS — K219 Gastro-esophageal reflux disease without esophagitis: Secondary | ICD-10-CM | POA: Diagnosis present

## 2023-01-17 DIAGNOSIS — R69 Illness, unspecified: Secondary | ICD-10-CM | POA: Diagnosis not present

## 2023-01-17 DIAGNOSIS — R0600 Dyspnea, unspecified: Secondary | ICD-10-CM | POA: Diagnosis not present

## 2023-01-17 DIAGNOSIS — R1310 Dysphagia, unspecified: Secondary | ICD-10-CM

## 2023-01-17 DIAGNOSIS — Z87891 Personal history of nicotine dependence: Secondary | ICD-10-CM | POA: Diagnosis not present

## 2023-01-17 DIAGNOSIS — H5500 Unspecified nystagmus: Secondary | ICD-10-CM | POA: Diagnosis present

## 2023-01-17 DIAGNOSIS — J3800 Paralysis of vocal cords and larynx, unspecified: Secondary | ICD-10-CM | POA: Diagnosis present

## 2023-01-17 DIAGNOSIS — R059 Cough, unspecified: Secondary | ICD-10-CM | POA: Diagnosis not present

## 2023-01-17 DIAGNOSIS — R053 Chronic cough: Secondary | ICD-10-CM | POA: Diagnosis not present

## 2023-01-17 DIAGNOSIS — R490 Dysphonia: Secondary | ICD-10-CM | POA: Diagnosis present

## 2023-01-17 DIAGNOSIS — R197 Diarrhea, unspecified: Secondary | ICD-10-CM | POA: Diagnosis not present

## 2023-01-17 DIAGNOSIS — R531 Weakness: Secondary | ICD-10-CM | POA: Diagnosis not present

## 2023-01-17 DIAGNOSIS — I82403 Acute embolism and thrombosis of unspecified deep veins of lower extremity, bilateral: Secondary | ICD-10-CM | POA: Insufficient documentation

## 2023-01-17 DIAGNOSIS — K5901 Slow transit constipation: Secondary | ICD-10-CM | POA: Diagnosis not present

## 2023-01-17 DIAGNOSIS — Z79899 Other long term (current) drug therapy: Secondary | ICD-10-CM | POA: Diagnosis not present

## 2023-01-17 DIAGNOSIS — R058 Other specified cough: Secondary | ICD-10-CM | POA: Insufficient documentation

## 2023-01-17 DIAGNOSIS — E876 Hypokalemia: Secondary | ICD-10-CM | POA: Diagnosis not present

## 2023-01-17 DIAGNOSIS — K5900 Constipation, unspecified: Secondary | ICD-10-CM | POA: Diagnosis not present

## 2023-01-17 DIAGNOSIS — I6340 Cerebral infarction due to embolism of unspecified cerebral artery: Secondary | ICD-10-CM | POA: Diagnosis not present

## 2023-01-17 MED ORDER — TRAZODONE HCL 50 MG PO TABS
25.0000 mg | ORAL_TABLET | Freq: Every evening | ORAL | Status: DC | PRN
  Administered 2023-01-18 – 2023-01-23 (×2): 50 mg via ORAL
  Filled 2023-01-17 (×2): qty 1

## 2023-01-17 MED ORDER — BISACODYL 10 MG RE SUPP: 10.0000 mg | Freq: Every day | RECTAL | Status: DC | PRN

## 2023-01-17 MED ORDER — FLEET ENEMA 7-19 GM/118ML RE ENEM
1.0000 | ENEMA | Freq: Once | RECTAL | Status: AC | PRN
  Administered 2023-01-20: 1 via RECTAL
  Filled 2023-01-17: qty 1

## 2023-01-17 MED ORDER — BUTALBITAL-APAP-CAFFEINE 50-325-40 MG PO TABS: 2.0000 | ORAL_TABLET | Freq: Four times a day (QID) | ORAL | Status: DC | PRN

## 2023-01-17 MED ORDER — ACETAMINOPHEN 325 MG PO TABS
650.0000 mg | ORAL_TABLET | Freq: Four times a day (QID) | ORAL | Status: DC | PRN
  Filled 2023-01-17: qty 2

## 2023-01-17 MED ORDER — MELATONIN 3 MG PO TABS
6.0000 mg | ORAL_TABLET | Freq: Every evening | ORAL | Status: DC | PRN
  Filled 2023-01-17: qty 2

## 2023-01-17 MED ORDER — POLYETHYLENE GLYCOL 3350 17 G PO PACK
17.0000 g | PACK | Freq: Every day | ORAL | Status: DC | PRN
  Administered 2023-01-21: 17 g via ORAL
  Filled 2023-01-17: qty 1

## 2023-01-17 MED ORDER — ADULT MULTIVITAMIN W/MINERALS CH
1.0000 | ORAL_TABLET | Freq: Every day | ORAL | Status: DC
  Administered 2023-01-18 – 2023-02-05 (×19): 1 via ORAL
  Filled 2023-01-17 (×19): qty 1

## 2023-01-17 MED ORDER — ROSUVASTATIN CALCIUM 20 MG PO TABS
20.0000 mg | ORAL_TABLET | Freq: Every day | ORAL | Status: DC
  Administered 2023-01-18 – 2023-02-05 (×19): 20 mg via ORAL
  Filled 2023-01-17 (×19): qty 1

## 2023-01-17 MED ORDER — FOLIC ACID 1 MG PO TABS: 1.0000 mg | ORAL_TABLET | Freq: Every day | ORAL | Status: DC

## 2023-01-17 MED ORDER — APIXABAN 5 MG PO TABS
5.0000 mg | ORAL_TABLET | Freq: Two times a day (BID) | ORAL | Status: DC
  Administered 2023-01-17 – 2023-01-20 (×6): 5 mg via ORAL
  Filled 2023-01-17 (×6): qty 1

## 2023-01-17 MED ORDER — PAROXETINE HCL 20 MG PO TABS
40.0000 mg | ORAL_TABLET | Freq: Every morning | ORAL | Status: DC
  Administered 2023-01-18 – 2023-02-05 (×19): 40 mg via ORAL
  Filled 2023-01-17 (×19): qty 2

## 2023-01-17 MED ORDER — FOLIC ACID 1 MG PO TABS
1.0000 mg | ORAL_TABLET | Freq: Every day | ORAL | Status: DC
  Administered 2023-01-18 – 2023-02-05 (×19): 1 mg via ORAL
  Filled 2023-01-17 (×19): qty 1

## 2023-01-17 MED ORDER — TOPIRAMATE 25 MG PO TABS
25.0000 mg | ORAL_TABLET | Freq: Two times a day (BID) | ORAL | Status: DC
  Administered 2023-01-17 – 2023-01-21 (×8): 25 mg via ORAL
  Filled 2023-01-17 (×9): qty 1

## 2023-01-17 MED ORDER — APIXABAN 5 MG PO TABS: 5.0000 mg | ORAL_TABLET | Freq: Two times a day (BID) | ORAL | Status: DC

## 2023-01-17 MED ORDER — BENZONATATE 100 MG PO CAPS: 200.0000 mg | ORAL_CAPSULE | Freq: Three times a day (TID) | ORAL | Status: DC | PRN

## 2023-01-17 MED ORDER — ONDANSETRON 4 MG PO TBDP
4.0000 mg | ORAL_TABLET | Freq: Three times a day (TID) | ORAL | Status: DC | PRN
  Administered 2023-01-17 – 2023-02-02 (×8): 4 mg via ORAL
  Filled 2023-01-17 (×9): qty 1

## 2023-01-17 MED ORDER — PROCHLORPERAZINE EDISYLATE 10 MG/2ML IJ SOLN: 5.0000 mg | Freq: Four times a day (QID) | INTRAMUSCULAR | Status: DC | PRN

## 2023-01-17 MED ORDER — PANTOPRAZOLE SODIUM 40 MG PO TBEC
40.0000 mg | DELAYED_RELEASE_TABLET | Freq: Two times a day (BID) | ORAL | Status: DC
  Administered 2023-01-17 – 2023-02-05 (×38): 40 mg via ORAL
  Filled 2023-01-17 (×38): qty 1

## 2023-01-17 MED ORDER — PROCHLORPERAZINE 25 MG RE SUPP: 12.5000 mg | Freq: Four times a day (QID) | RECTAL | Status: DC | PRN

## 2023-01-17 MED ORDER — GUAIFENESIN-DM 100-10 MG/5ML PO SYRP
5.0000 mL | ORAL_SOLUTION | Freq: Four times a day (QID) | ORAL | Status: DC | PRN
  Administered 2023-01-23: 10 mL via ORAL
  Filled 2023-01-17: qty 10

## 2023-01-17 MED ORDER — DIPHENHYDRAMINE HCL 12.5 MG/5ML PO ELIX
12.5000 mg | ORAL_SOLUTION | Freq: Four times a day (QID) | ORAL | Status: DC | PRN
  Filled 2023-01-17: qty 10

## 2023-01-17 MED ORDER — ALPRAZOLAM 0.5 MG PO TABS
0.5000 mg | ORAL_TABLET | Freq: Every evening | ORAL | Status: DC | PRN
  Administered 2023-01-29 – 2023-02-01 (×4): 0.5 mg via ORAL
  Filled 2023-01-17 (×4): qty 1

## 2023-01-17 MED ORDER — LIDOCAINE 5 % EX PTCH
1.0000 | MEDICATED_PATCH | CUTANEOUS | Status: DC
  Administered 2023-01-18 – 2023-02-05 (×19): 1 via TRANSDERMAL
  Filled 2023-01-17 (×20): qty 1

## 2023-01-17 MED ORDER — ACETAMINOPHEN 325 MG PO TABS
325.0000 mg | ORAL_TABLET | ORAL | Status: DC | PRN
  Administered 2023-01-18 – 2023-02-02 (×12): 650 mg via ORAL
  Filled 2023-01-17 (×12): qty 2

## 2023-01-17 MED ORDER — ALUM & MAG HYDROXIDE-SIMETH 200-200-20 MG/5ML PO SUSP: 30.0000 mL | ORAL | Status: DC | PRN

## 2023-01-17 MED ORDER — CYCLOBENZAPRINE HCL 5 MG PO TABS
5.0000 mg | ORAL_TABLET | Freq: Three times a day (TID) | ORAL | Status: DC | PRN
  Administered 2023-01-18 – 2023-01-23 (×4): 5 mg via ORAL
  Filled 2023-01-17 (×5): qty 1

## 2023-01-17 MED ORDER — IPRATROPIUM-ALBUTEROL 0.5-2.5 (3) MG/3ML IN SOLN
3.0000 mL | Freq: Four times a day (QID) | RESPIRATORY_TRACT | Status: DC
  Administered 2023-01-17: 3 mL via RESPIRATORY_TRACT
  Filled 2023-01-17: qty 3

## 2023-01-17 MED ORDER — IPRATROPIUM-ALBUTEROL 0.5-2.5 (3) MG/3ML IN SOLN: 3.0000 mL | Freq: Four times a day (QID) | RESPIRATORY_TRACT | Status: DC

## 2023-01-17 MED ORDER — BUTALBITAL-APAP-CAFFEINE 50-325-40 MG PO TABS
2.0000 | ORAL_TABLET | Freq: Four times a day (QID) | ORAL | Status: DC | PRN
  Administered 2023-01-17 – 2023-01-29 (×34): 2 via ORAL
  Filled 2023-01-17 (×35): qty 2

## 2023-01-17 MED ORDER — FAMOTIDINE 20 MG PO TABS
20.0000 mg | ORAL_TABLET | Freq: Two times a day (BID) | ORAL | Status: DC
  Administered 2023-01-17 – 2023-02-05 (×38): 20 mg via ORAL
  Filled 2023-01-17 (×38): qty 1

## 2023-01-17 MED ORDER — PROCHLORPERAZINE MALEATE 5 MG PO TABS
5.0000 mg | ORAL_TABLET | Freq: Four times a day (QID) | ORAL | Status: DC | PRN
  Administered 2023-01-18 – 2023-01-22 (×2): 10 mg via ORAL
  Filled 2023-01-17 (×2): qty 2

## 2023-01-17 NOTE — Discharge Summary (Signed)
Physician Discharge Summary  Patricia [name] YTK:[id number] DOB: 11-14-1946 DOA: 01/10/2023  PCP: Curt Bears, MD  Admit date: 01/10/2023 Discharge date: 01/17/2023  Admitted From: Home Disposition: CIR  Recommendations for Outpatient Follow-up:  Follow up with CIR provider at earliest convenience Patient follow-up with neurology Follow up in ED if symptoms worsen or new appear   Home Health: No Equipment/Devices: None  Discharge Condition: Stable CODE STATUS: DNR Diet recommendation: Heart healthy/diet as per activity limitations  Brief/Interim Summary: 77 yo female with PMH Stage IV NSCLC adenocarcinoma (extensive lymphadenopathy involving the neck, left hilar and mediastinal lymphadenopathy as well as upper abdomen lymph nodes and multiple subcutaneous/muscular nodules diagnosed in January 2024) on palliative systemic chemo, depression, GERD, HLD presented with left lower extremity weakness and was admitted for stroke workup.  MRI brain showed small scattered acute and subacute infarcts in the bilateral cerebral hemisphere.  Neurology was consulted.  Currently on Eliquis.  PT recommended CIR placement.  Currently medically stable for CIR discharge.  Discharge to CIR once bed is available.  Discharge Diagnoses:   Acute CVA Dysphagia Hyperlipidemia -Presented with left lower extremity weakness. -MRI brain showed small scattered acute and subacute infarcts in the bilateral cerebral hemisphere.  Neurology was consulted.  Currently on Eliquis.  PT recommended CIR placement.  Currently medically stable for CIR discharge.  Outpatient follow-up with neurology -Diet as per SLP recommendations -A1c 6%.  LDL 61.  Currently on Crestor. -Echo showed EF of 60 to 65% with grade 1 diastolic dysfunction -Discharge to CIR once bed is available.   Hypertension -Blood pressure intermittently on the higher side.  Valsartan and hydrochlorothiazide on hold.  Initially allowed for permissive  hypertension.  Might have to restart antihypertensives.   Hoarseness -Chronic: Due to cancer and oncology treatments.   Stage IV adenocarcinoma of left lung - follows with Dr. Julien Nordmann; diagnosed Jan 2024 - Stage IV (T1c, N3, M1 C) non-small cell lung cancer, adenocarcinoma presented with left upper lobe metastatic neoplasm presented with left lower lobe lung nodule in addition to extensive lymphadenopathy involving the neck, left hilar and mediastinal lymphadenopathy as well as upper abdomen lymph nodes and multiple subcutaneous/muscular nodules diagnosed in January 2024.  -Prior hospitalist discussed with Dr. Julien Nordmann.  Chemo okay to resume after discharge from CIR   Anxiety and depression--continue Xanax and Paxil   GERD -Continue PPI  Discharge Instructions  Discharge Instructions     Ambulatory referral to Neurology   Complete by: As directed    An appointment is requested in approximately: 2 weeks   Diet - low sodium heart healthy   Complete by: As directed    Increase activity slowly   Complete by: As directed       Allergies as of 01/17/2023       Reactions   Codeine Nausea Only        Medication List     STOP taking these medications    amoxicillin-clavulanate 875-125 MG tablet Commonly known as: AUGMENTIN   lidocaine 2 % solution Commonly known as: XYLOCAINE   magic mouthwash (nystatin, lidocaine, diphenhydrAMINE, alum & mag hydroxide) suspension   valsartan-hydrochlorothiazide 160-25 MG tablet Commonly known as: DIOVAN-HCT       TAKE these medications    ALPRAZolam 0.5 MG tablet Commonly known as: XANAX TAKE (1) TABLET BY MOUTH AT BEDTIME AS NEEDED FOR SLEEP. What changed: See the new instructions.   apixaban 5 MG Tabs tablet Commonly known as: ELIQUIS Take 1 tablet (5 mg total) by mouth 2 (  two) times daily.   butalbital-acetaminophen-caffeine 50-325-40 MG tablet Commonly known as: FIORICET Take 2 tablets by mouth every 6 (six) hours as  needed for headache.   folic acid 1 MG tablet Commonly known as: FOLVITE Take 1 tablet (1 mg total) by mouth daily.   lansoprazole 30 MG capsule Commonly known as: PREVACID Take one po qd for acid reflux   meclizine 25 MG tablet Commonly known as: ANTIVERT Take one tablet po TID prn dizziness   multivitamin with minerals Tabs tablet Take 1 tablet by mouth daily.   naproxen sodium 220 MG tablet Commonly known as: ALEVE Take 220 mg by mouth daily as needed (pain).   PARoxetine 40 MG tablet Commonly known as: PAXIL TAKE 1 TABLET BY MOUTH EVERY DAY IN THE MORNING What changed: See the new instructions.   prochlorperazine 10 MG tablet Commonly known as: COMPAZINE Take 1 tablet (10 mg total) by mouth every 6 (six) hours as needed for nausea or vomiting.   rosuvastatin 20 MG tablet Commonly known as: CRESTOR Take 1 tablet (20 mg total) by mouth daily.   Vitamin D3 125 MCG (5000 UT) Caps Take 5,000 Units by mouth daily.        Allergies  Allergen Reactions   Codeine Nausea Only    Consultations: Neurology   Procedures/Studies: DG Swallowing Func-Speech Pathology  Result Date: 01/13/2023 Table formatting from the original result was not included. Objective Swallowing Evaluation: Type of Study: MBS-Modified Barium Swallow Study  Patient Details Name: Patricia [name] MRN: [id number] Date of Birth: 05-16-1946 Today's Date: 01/13/2023 Time: SLP Start Time (ACUTE ONLY): 0940 -SLP Stop Time (ACUTE ONLY): 0956 SLP Time Calculation (min) (ACUTE ONLY): 16 min Past Medical History: Past Medical History: Diagnosis Date  Depression   GERD (gastroesophageal reflux disease)   Hypercholesterolemia   IFG (impaired fasting glucose)   Osteopenia   PONV (postoperative nausea and vomiting)  Past Surgical History: Past Surgical History: Procedure Laterality Date  ABDOMINAL HYSTERECTOMY    CATARACT EXTRACTION W/PHACO  09/12/2011  Procedure: CATARACT EXTRACTION PHACO AND INTRAOCULAR LENS PLACEMENT  (Long Neck);  Surgeon: Tonny Branch;  Location: [facility];  Service: Ophthalmology;  Laterality: Left;  CDE: 8.91  CATARACT EXTRACTION W/PHACO  11/18/2011  Procedure: CATARACT EXTRACTION PHACO AND INTRAOCULAR LENS PLACEMENT (IOC);  Surgeon: Tonny Branch;  Location: [facility];  Service: Ophthalmology;  Laterality: Right;  CDE:10.26  COLONOSCOPY  2007  Dr. Gala Romney: internal hemorrhoids, single anal papilla poor prep.   COLONOSCOPY N/A 03/27/2017  Rourk: Normal exam.  ECTOPIC PREGNANCY SURGERY    removal of right tube  ESOPHAGOGASTRODUODENOSCOPY (EGD) WITH PROPOFOL N/A 09/10/2021  Procedure: ESOPHAGOGASTRODUODENOSCOPY (EGD) WITH PROPOFOL;  Surgeon: Daneil Dolin, MD;  Location: [facility];  Service: Endoscopy;  Laterality: N/A;  8:15AM  MALONEY DILATION N/A 09/10/2021  Procedure: Venia Minks DILATION;  Surgeon: Daneil Dolin, MD;  Location: [facility];  Service: Endoscopy;  Laterality: N/A;  OVARIAN CYST REMOVAL    right  RECTOCELE REPAIR N/A 01/18/2014  Procedure: POSTERIOR REPAIR (RECTOCELE);  Surgeon: Jonnie Kind, MD;  Location: [facility];  Service: Gynecology;  Laterality: N/A;  VAGINAL HYSTERECTOMY N/A 01/18/2014  Procedure: HYSTERECTOMY VAGINAL;  Surgeon: Jonnie Kind, MD;  Location: [facility];  Service: Gynecology;  Laterality: N/A; HPI: Ms. Digeronimo is a 77 yo female with PMH Stage IV NSCLC adenocarcinoma (extensive lymphadenopathy involving the neck, left hilar and mediastinal lymphadenopathy as well as upper abdomen lymph nodes and multiple subcutaneous/muscular nodules diagnosed in January 2024) on palliative systemic chemo,  depression, GERD, HLD.  She presented on 01/11/23 with acute onset of left lower extremity weakness notably dragging her left leg starting on approximately Thursday.  She was recently seen approximately 1 week ago for new onset of headache and had no focal neurodeficits at that time.  She was admitted for stroke workup.  MRI brain was performed which showed small scattered acute and subacute infarcts  in the bilateral cerebral hemisphere; Pt with choking episode on 01/11/33 during lunch tray, so BSE completed and pt placed on D3/thin liquid diet with precautions; MBS recommended to r/o silent aspiration/determine safest diet consistency/assess swallow function under VF d/t new CVA dx/L adinocarcinoma present.  Pt denoted she "got choked on Frosted flakes" this morning.  Subjective: "I got choked on Frosted Flakes"  Recommendations for follow up therapy are one component of a multi-disciplinary discharge planning process, led by the attending physician.  Recommendations may be updated based on patient status, additional functional criteria and insurance authorization. Assessment / Plan / Recommendation   01/13/2023  10:00 AM Clinical Impressions Clinical Impression Pt with essentially normal swallow function with exception of piecemeal deglutition with mixed consistency spilling into vallecular space/pyriform (barely) prior to swallow which places pt at risk for penetration/aspiration with this consistency during meals.  Discussed swallowing strategies to eliminate this risk including smaller bites or draining liquid from spoon prior to consuming mixed consistency.  Pt in agreement.  Recommend continue current diet of Mechanical soft/thin (Dysphagia 3) with gravy included with meats and/or mixing with other moist foods to eliminate dryness during consumption of POs.  ST will f/u for diet tolerance/education/cog tx in acute setting.  Recommend CIR if pt able. SLP Visit Diagnosis Dysphagia, unspecified (R13.10) Impact on safety and function Mild aspiration risk     01/13/2023  10:00 AM Treatment Recommendations Treatment Recommendations Therapy as outlined in treatment plan below     01/13/2023  10:00 AM Prognosis Prognosis for Safe Diet Advancement Good   01/13/2023  10:00 AM Diet Recommendations SLP Diet Recommendations Dysphagia 3 (Mech soft) solids;Thin liquid Liquid Administration via Cup;Straw;Other (Comment) Medication  Administration Whole meds with liquid Compensations Slow rate;Small sips/bites;Minimize environmental distractions Postural Changes Seated upright at 90 degrees     01/13/2023  10:00 AM Other Recommendations Oral Care Recommendations Oral care BID;Patient independent with oral care Other Recommendations Other (Comment) Follow Up Recommendations Acute inpatient rehab (3hours/day) Functional Status Assessment Patient has had a recent decline in their functional status and demonstrates the ability to make significant improvements in function in a reasonable and predictable amount of time.   01/13/2023  10:00 AM Frequency and Duration  Speech Therapy Frequency (ACUTE ONLY) min 2x/week Treatment Duration 1 week     01/13/2023  10:00 AM Oral Phase Oral Phase [facility]    01/13/2023  10:00 AM Pharyngeal Phase Pharyngeal Phase Impaired Pharyngeal- Thin Teaspoon WFL Pharyngeal Material does not enter airway Pharyngeal- Thin Cup [facility] Pharyngeal Material does not enter airway Pharyngeal- Thin Straw WFL Pharyngeal Material does not enter airway Pharyngeal- Puree WFL Pharyngeal Material does not enter airway Pharyngeal- Mechanical Soft WFL Pharyngeal Material does not enter airway Pharyngeal- Regular NT Pharyngeal- Multi-consistency Delayed swallow initiation-vallecula;Delayed swallow initiation-pyriform sinuses Pharyngeal Material does not enter airway;Other (Comment) Pharyngeal- Pill NT Pharyngeal Comment Pt's swallow appeared within functional limits with exception of mixed consistencies which entered vallecular space and min in pyriforms prior to swallow, but did NOT enter airway    01/13/2023  10:00 AM Cervical Esophageal Phase  Cervical Esophageal Phase [facility] Elvina Sidle,  M.S., [place] 01/13/2023, 10:21 AM                     ECHOCARDIOGRAM COMPLETE  Result Date: 01/11/2023    ECHOCARDIOGRAM REPORT   Patient Name:   OLUWADAMILOLA DELIZ Haig Date of Exam: 01/11/2023 Medical Rec #:  [id number]         Height:       58.0 in Accession #:    [id number]         Weight:       139.0 lb Date of Birth:  1946/06/16          BSA:          1.560 m Patient Age:    77 years          BP:           77/54 mmHg Patient Gender: F                 HR:           69 bpm. Exam Location:  Inpatient Procedure: 2D Echo, Color Doppler, Cardiac Doppler and Intracardiac            Opacification Agent Indications:    Stroke  History:        Patient has no prior history of Echocardiogram examinations.                 Risk Factors:Dyslipidemia. Lung cancer.  Sonographer:    Eartha Inch Referring Phys: [id number] ASIA B Cushing  Sonographer Comments: Technically difficult study due to poor echo windows. Image acquisition challenging due to patient body habitus and Image acquisition challenging due to respiratory motion. IMPRESSIONS  1. Left ventricular ejection fraction, by estimation, is 60 to 65%. The left ventricle has normal function. The left ventricle has no regional wall motion abnormalities. Left ventricular diastolic parameters are consistent with Grade I diastolic dysfunction (impaired relaxation).  2. Right ventricular systolic function is normal. The right ventricular size is normal.  3. No evidence of mitral valve regurgitation.  4. The aortic valve is grossly normal. Aortic valve regurgitation is not visualized.  5. The inferior vena cava is normal in size with greater than 50% respiratory variability, suggesting right atrial pressure of 3 mmHg. FINDINGS  Left Ventricle: Left ventricular ejection fraction, by estimation, is 60 to 65%. The left ventricle has normal function. The left ventricle has no regional wall motion abnormalities. Definity contrast agent was given IV to delineate the left ventricular  endocardial borders. The left ventricular internal cavity size was normal in size. There is no left ventricular hypertrophy. Left ventricular diastolic parameters are consistent with Grade I diastolic dysfunction (impaired relaxation). Right Ventricle: The right ventricular size  is normal. Right ventricular systolic function is normal. Left Atrium: Left atrial size was normal in size. Right Atrium: Right atrial size was normal in size. Pericardium: There is no evidence of pericardial effusion. Mitral Valve: No evidence of mitral valve regurgitation. MV peak gradient, 5.2 mmHg. The mean mitral valve gradient is 1.0 mmHg. Tricuspid Valve: Tricuspid valve regurgitation is not demonstrated. Aortic Valve: The aortic valve is grossly normal. Aortic valve regurgitation is not visualized. Pulmonic Valve: Pulmonic valve regurgitation is not visualized. Aorta: The aortic root and ascending aorta are structurally normal, with no evidence of dilitation. Venous: The inferior vena cava is normal in size with greater than 50% respiratory variability, suggesting right atrial pressure of 3 mmHg. IAS/Shunts: No atrial level shunt detected by color flow Doppler.  LEFT VENTRICLE PLAX 2D LVIDd:         3.30 cm   Diastology LVIDs:         2.20 cm   LV e' medial:    4.57 cm/s LV PW:         0.90 cm   LV E/e' medial:  17.4 LV IVS:        1.00 cm   LV e' lateral:   5.11 cm/s LVOT diam:     1.90 cm   LV E/e' lateral: 15.6 LV SV:         69 LV SV Index:   44 LVOT Area:     2.84 cm  RIGHT VENTRICLE RV S prime:     14.40 cm/s TAPSE (M-mode): 1.6 cm LEFT ATRIUM             Index LA diam:        2.40 cm 1.54 cm/m LA Vol (A2C):   41.2 ml 26.41 ml/m LA Vol (A4C):   35.2 ml 22.56 ml/m LA Biplane Vol: 39.7 ml 25.45 ml/m  AORTIC VALVE LVOT Vmax:   127.00 cm/s LVOT Vmean:  84.500 cm/s LVOT VTI:    0.243 m  AORTA Ao Root diam: 2.90 cm Ao Asc diam:  3.10 cm MITRAL VALVE MV Area (PHT): 2.73 cm     SHUNTS MV Area VTI:   2.06 cm     Systemic VTI:  0.24 m MV Peak grad:  5.2 mmHg     Systemic Diam: 1.90 cm MV Mean grad:  1.0 mmHg MV Vmax:       1.14 m/s MV Vmean:      51.8 cm/s MV Decel Time: 278 msec MV E velocity: 79.70 cm/s MV A velocity: 111.00 cm/s MV E/A ratio:  0.72 Mary Scientist, physiological signed by Phineas Inches  Signature Date/Time: 01/11/2023/4:52:53 PM    Final    MR ANGIO HEAD WO CONTRAST  Result Date: 01/11/2023 CLINICAL DATA:  Stroke suspected. History of lung cancer under treatment EXAM: MRI HEAD WITHOUT AND WITH CONTRAST MRA HEAD WITHOUT CONTRAST MRA NECK WITHOUT AND WITH CONTRAST TECHNIQUE: Multiplanar, multiecho pulse sequences of the brain and surrounding structures were obtained without and with intravenous contrast. Angiographic images of the Circle of Willis were obtained using MRA technique without intravenous contrast. Angiographic images of the neck were obtained using MRA technique without and with intravenous contrast. Carotid stenosis measurements (when applicable) are obtained utilizing NASCET criteria, using the distal internal carotid diameter as the denominator. CONTRAST:  6.7mL GADAVIST GADOBUTROL 1 MMOL/ML IV SOLN COMPARISON:  Head CT from yesterday FINDINGS: MRI HEAD FINDINGS Brain: Small acute infarcts scattered along the right frontal and occipital cortex, right frontal involvement is parasagittal in the posterior ACA distribution. There is also mild bilateral cerebral white matter involvement. A faint focus of diffusion restriction noted in the left parietal subcortical region. Small focus of enhancement in the posterior right frontal cortex is likely an area of subacute ischemia we will need follow-up. No swelling or definite mass. Normal brain volume. Mild chronic small vessel ischemia in the cerebral white matter and pons. No hydrocephalus or collection Vascular: Major flow voids and vascular enhancements are preserved Skull and upper cervical spine: No focal marrow lesion. Cervical spine degeneration with C3-4 anterolisthesis. Sinuses/Orbits: Negative Other: Reticulation in the bilateral subcutaneous face attributed to cosmetic injections MRA HEAD FINDINGS No branch occlusion, beading, or flow limiting stenosis. Negative for aneurysm or vascular malformation MRA NECK FINDINGS Antegrade flow  in the  carotid and vertebral arteries by time-of-flight. Normal arch on postcontrast imaging. When allowing for motion the carotid and vertebral arteries are widely patent and smoothly contoured in the bilateral neck. Tortuosity without beading affects the major arteries diffusely. IMPRESSION: 1. Small scattered acute and subacute infarcts in the bilateral cerebral hemisphere suggesting central embolic disease. Enhancing nodule in the right frontal cortex is most likely a subacute infarct but recommend follow-up in 6-8 weeks given active malignancy. 2. No underlying stenosis or irregularity of major arteries in the head and neck. Electronically Signed   By: Jorje Guild M.D.   On: 01/11/2023 10:41   MR BRAIN W WO CONTRAST  Result Date: 01/11/2023 CLINICAL DATA:  Stroke suspected. History of lung cancer under treatment EXAM: MRI HEAD WITHOUT AND WITH CONTRAST MRA HEAD WITHOUT CONTRAST MRA NECK WITHOUT AND WITH CONTRAST TECHNIQUE: Multiplanar, multiecho pulse sequences of the brain and surrounding structures were obtained without and with intravenous contrast. Angiographic images of the Circle of Willis were obtained using MRA technique without intravenous contrast. Angiographic images of the neck were obtained using MRA technique without and with intravenous contrast. Carotid stenosis measurements (when applicable) are obtained utilizing NASCET criteria, using the distal internal carotid diameter as the denominator. CONTRAST:  6.84mL GADAVIST GADOBUTROL 1 MMOL/ML IV SOLN COMPARISON:  Head CT from yesterday FINDINGS: MRI HEAD FINDINGS Brain: Small acute infarcts scattered along the right frontal and occipital cortex, right frontal involvement is parasagittal in the posterior ACA distribution. There is also mild bilateral cerebral white matter involvement. A faint focus of diffusion restriction noted in the left parietal subcortical region. Small focus of enhancement in the posterior right frontal cortex is likely  an area of subacute ischemia we will need follow-up. No swelling or definite mass. Normal brain volume. Mild chronic small vessel ischemia in the cerebral white matter and pons. No hydrocephalus or collection Vascular: Major flow voids and vascular enhancements are preserved Skull and upper cervical spine: No focal marrow lesion. Cervical spine degeneration with C3-4 anterolisthesis. Sinuses/Orbits: Negative Other: Reticulation in the bilateral subcutaneous face attributed to cosmetic injections MRA HEAD FINDINGS No branch occlusion, beading, or flow limiting stenosis. Negative for aneurysm or vascular malformation MRA NECK FINDINGS Antegrade flow in the carotid and vertebral arteries by time-of-flight. Normal arch on postcontrast imaging. When allowing for motion the carotid and vertebral arteries are widely patent and smoothly contoured in the bilateral neck. Tortuosity without beading affects the major arteries diffusely. IMPRESSION: 1. Small scattered acute and subacute infarcts in the bilateral cerebral hemisphere suggesting central embolic disease. Enhancing nodule in the right frontal cortex is most likely a subacute infarct but recommend follow-up in 6-8 weeks given active malignancy. 2. No underlying stenosis or irregularity of major arteries in the head and neck. Electronically Signed   By: Jorje Guild M.D.   On: 01/11/2023 10:41   MR ANGIO NECK W WO CONTRAST  Result Date: 01/11/2023 CLINICAL DATA:  Stroke suspected. History of lung cancer under treatment EXAM: MRI HEAD WITHOUT AND WITH CONTRAST MRA HEAD WITHOUT CONTRAST MRA NECK WITHOUT AND WITH CONTRAST TECHNIQUE: Multiplanar, multiecho pulse sequences of the brain and surrounding structures were obtained without and with intravenous contrast. Angiographic images of the Circle of Willis were obtained using MRA technique without intravenous contrast. Angiographic images of the neck were obtained using MRA technique without and with intravenous  contrast. Carotid stenosis measurements (when applicable) are obtained utilizing NASCET criteria, using the distal internal carotid diameter as the denominator. CONTRAST:  6.61mL GADAVIST GADOBUTROL 1  MMOL/ML IV SOLN COMPARISON:  Head CT from yesterday FINDINGS: MRI HEAD FINDINGS Brain: Small acute infarcts scattered along the right frontal and occipital cortex, right frontal involvement is parasagittal in the posterior ACA distribution. There is also mild bilateral cerebral white matter involvement. A faint focus of diffusion restriction noted in the left parietal subcortical region. Small focus of enhancement in the posterior right frontal cortex is likely an area of subacute ischemia we will need follow-up. No swelling or definite mass. Normal brain volume. Mild chronic small vessel ischemia in the cerebral white matter and pons. No hydrocephalus or collection Vascular: Major flow voids and vascular enhancements are preserved Skull and upper cervical spine: No focal marrow lesion. Cervical spine degeneration with C3-4 anterolisthesis. Sinuses/Orbits: Negative Other: Reticulation in the bilateral subcutaneous face attributed to cosmetic injections MRA HEAD FINDINGS No branch occlusion, beading, or flow limiting stenosis. Negative for aneurysm or vascular malformation MRA NECK FINDINGS Antegrade flow in the carotid and vertebral arteries by time-of-flight. Normal arch on postcontrast imaging. When allowing for motion the carotid and vertebral arteries are widely patent and smoothly contoured in the bilateral neck. Tortuosity without beading affects the major arteries diffusely. IMPRESSION: 1. Small scattered acute and subacute infarcts in the bilateral cerebral hemisphere suggesting central embolic disease. Enhancing nodule in the right frontal cortex is most likely a subacute infarct but recommend follow-up in 6-8 weeks given active malignancy. 2. No underlying stenosis or irregularity of major arteries in the head  and neck. Electronically Signed   By: Jorje Guild M.D.   On: 01/11/2023 10:41   CT HEAD WO CONTRAST  Result Date: 01/10/2023 CLINICAL DATA:  Sudden left leg weakness. Neuro deficit, acute, stroke suspected. EXAM: CT HEAD WITHOUT CONTRAST TECHNIQUE: Contiguous axial images were obtained from the base of the skull through the vertex without intravenous contrast. RADIATION DOSE REDUCTION: This exam was performed according to the departmental dose-optimization program which includes automated exposure control, adjustment of the mA and/or kV according to patient size and/or use of iterative reconstruction technique. COMPARISON:  None Available. FINDINGS: Brain: No acute intracranial hemorrhage, midline shift or mass effect. No extra-axial fluid collection. Mild atrophy is noted. Mild periventricular white matter hypodensities are present bilaterally. No hydrocephalus. Vascular: No hyperdense vessel or unexpected calcification. Skull: Normal. Negative for fracture or focal lesion. Sinuses/Orbits: No acute finding. Other: None. IMPRESSION: 1. No acute intracranial process. 2. Atrophy with chronic microvascular ischemic changes. Electronically Signed   By: Brett Fairy M.D.   On: 01/10/2023 20:04      Subjective: Patient seen and examined at bedside.  No fever, chest pain, worsening shortness of breath reported.  Had intermittent headaches yesterday but improved currently.  Discharge Exam: Vitals:   01/17/23 0400 01/17/23 0721  BP: (!) 140/93 125/60  Pulse: 76 65  Resp: 16 19  Temp: 97.8 F (36.6 C) 97.8 F (36.6 C)  SpO2: 95% 96%    General exam: No distress.  On room air currently.  Hoarse/whispering voice. Respiratory system: Bilateral decreased breath sounds at bases with scattered crackles  cardiovascular system: S1-S2 heard; rate controlled currently  gastrointestinal system: Abdomen is mildly distended; soft and nontender.  Normal bowel sounds heard  extremities: No cyanosis or  clubbing    The results of significant diagnostics from this hospitalization (including imaging, microbiology, ancillary and laboratory) are listed below for reference.     Microbiology: No results found for this or any previous visit (from the past 240 hour(s)).   Labs: BNP (last 3 results)  No results for input(s): "BNP" in the last 8760 hours. Basic Metabolic Panel: Recent Labs  Lab 01/10/23 1800 01/11/23 0644  NA 139 138  K 3.6 3.6  CL 103 103  CO2 27 28  GLUCOSE 95 110*  BUN 12 13  CREATININE 0.71 0.84  CALCIUM 9.1 9.2   Liver Function Tests: Recent Labs  Lab 01/10/23 1800 01/11/23 0644  AST 19 17  ALT 16 16  ALKPHOS 76 69  BILITOT 0.7 0.6  PROT 6.3* 5.9*  ALBUMIN 3.4* 3.3*   No results for input(s): "LIPASE", "AMYLASE" in the last 168 hours. No results for input(s): "AMMONIA" in the last 168 hours. CBC: Recent Labs  Lab 01/10/23 1800 01/11/23 0644  WBC 5.0 4.9  NEUTROABS 1.6*  --   HGB 10.5* 10.6*  HCT 32.4* 32.0*  MCV 87.3 86.5  PLT 76* 75*   Cardiac Enzymes: No results for input(s): "CKTOTAL", "CKMB", "CKMBINDEX", "TROPONINI" in the last 168 hours. BNP: Invalid input(s): "POCBNP" CBG: Recent Labs  Lab 01/10/23 1723 01/14/23 2044  GLUCAP 106* 104*   D-Dimer No results for input(s): "DDIMER" in the last 72 hours. Hgb A1c No results for input(s): "HGBA1C" in the last 72 hours. Lipid Profile No results for input(s): "CHOL", "HDL", "LDLCALC", "TRIG", "CHOLHDL", "LDLDIRECT" in the last 72 hours. Thyroid function studies No results for input(s): "TSH", "T4TOTAL", "T3FREE", "THYROIDAB" in the last 72 hours.  Invalid input(s): "FREET3" Anemia work up No results for input(s): "VITAMINB12", "FOLATE", "FERRITIN", "TIBC", "IRON", "RETICCTPCT" in the last 72 hours. Urinalysis    Component Value Date/Time   COLORURINE YELLOW 01/10/2023 2119   APPEARANCEUR CLEAR 01/10/2023 2119   LABSPEC 1.012 01/10/2023 2119   PHURINE 7.0 01/10/2023 2119    GLUCOSEU NEGATIVE 01/10/2023 2119   HGBUR NEGATIVE 01/10/2023 2119   BILIRUBINUR NEGATIVE 01/10/2023 2119   [place] NEGATIVE 01/10/2023 2119   PROTEINUR NEGATIVE 01/10/2023 2119   UROBILINOGEN 0.2 01/12/2014 1045   NITRITE NEGATIVE 01/10/2023 2119   LEUKOCYTESUR NEGATIVE 01/10/2023 2119   Sepsis Labs Recent Labs  Lab 01/10/23 1800 01/11/23 0644  WBC 5.0 4.9   Microbiology No results found for this or any previous visit (from the past 240 hour(s)).   Time coordinating discharge: 35 minutes  SIGNED:   Aline August, MD  [facility] 01/17/2023, 10:19 AM

## 2023-01-17 NOTE — TOC Transition Note (Signed)
Transition of Care [facility]) - CM/SW Discharge Note   Patient Details  Name: Patricia [name] MRN: [id number] Date of Birth: 12-May-1946  [facility]) CM/SW Contact:  Pollie Friar, RN Phone Number: 01/17/2023, 10:32 AM   Clinical Narrative:    Pt discharging to CIR today. CM signing off.    Final next level of care: IP Rehab Facility Barriers to Discharge: No Barriers Identified   Patient Goals and CMS Choice CMS Medicare.gov Compare Post Acute Care list provided to:: Patient Choice offered to / list presented to : Patient  Discharge Placement                         Discharge Plan and Services Additional resources added to the After Visit Summary for                                       Social Determinants of Health (SDOH) Interventions SDOH Screenings   Food Insecurity: No Food Insecurity (01/12/2023)  Housing: Low Risk  (01/12/2023)  Transportation Needs: No Transportation Needs (01/12/2023)  Utilities: Not At Risk (01/12/2023)  Depression (PHQ2-9): High Risk (08/02/2022)  Tobacco Use: Medium Risk (01/10/2023)     Readmission Risk Interventions     No data to display

## 2023-01-17 NOTE — Progress Notes (Signed)
PMR Admission Coordinator Pre-Admission Assessment   Patient: Patricia [name] is an 77 y.o., female MRN: [id number] DOB: [birth year] Height: 4\' 10"  (147.3 cm) Weight: 63 kg   Insurance Information HMO:     PPO: yes     PCP:      IPA:      80/20:      OTHER:  PRIMARY: [employer] Medicare      Policy#: [id number]      Subscriber: pt CM Name: Joellen Jersey      Phone#: not provided     Fax#: [phone] Pre-Cert#: [id number] auth for CIR provided from [place] with [facility] with updates due to Santiago Glad [phone]) at fax listed above on 2/15      Employer:  Benefits:  Phone #: [phone]     Name:  Eff. Date: 12/09/22     Deduct: $0      Out of Pocket Max: $4500 (met $375)      Life Max:  CIR: $295/day for days 1-6      SNF: 20 full days Outpatient:      Co-Pay: $20/visit Home Health: 100%      Co-Pay:  DME: 80%     Co-Pay: 20% Providers:  SECONDARY:       Policy#:      Phone#:    Development worker, community:       Phone#:    The Therapist, art Information Summary" for patients in Inpatient Rehabilitation Facilities with attached "Privacy Act [place] Records" was provided and verbally reviewed with: Patient and Family   Emergency Contact Information Contact Information       Name Relation Home Work Mobile    Paschal,Heather Granddaughter [phone]   [phone]    Lisman Spouse [phone]   [phone]           Current Medical History  Patient Admitting Diagnosis: CVA    History of Present Illness: Pt is a 77 y/o  female with PMH of depression, GERD, and new dx of non-small cell lung cancer currently undergoing treatment, followed by Dr. Earlie Server.  Pt presented to [facility] on 01/10/23 with LLE weakness, vertigo, and several falls.  Vitals in ED WNL, thrombocytopenia related to oncologic treatments, but otherwise labs unremarkable.  Neuro was consulted for concern for CVA and recommended full workup.  MRI showed small scattered acute and subacute infarcts in the  bilateral hemispheres.  Pt to continue eliquis for [facility].  Therapy ongoing and recommendations are for CIR.     Complete NIHSS TOTAL: 4   Patient's [facility] record from [facility] has been reviewed by the rehabilitation admission coordinator and physician.   Past Medical History      Past Medical History:  Diagnosis Date   Depression     GERD (gastroesophageal reflux disease)     Hypercholesterolemia     IFG (impaired fasting glucose)     Osteopenia     PONV (postoperative nausea and vomiting)        Has the patient had major surgery during 100 days prior to admission? No   Family History   family history includes Breast cancer in her maternal aunt and paternal aunt; Cancer in her paternal aunt; Heart attack in her father; Heart attack (age of onset: 77) in her brother; Osteoporosis in her mother.   Current Medications   Current Facility-Administered Medications:    acetaminophen (TYLENOL) tablet 650 mg, 650 mg, Oral, Q4H PRN, 650 mg at 01/17/23 0418 **OR** acetaminophen (TYLENOL) 160 MG/5ML solution 650 mg, 650 mg,  Per Tube, Q4H PRN **OR** acetaminophen (TYLENOL) suppository 650 mg, 650 mg, Rectal, Q4H PRN, Zierle-Ghosh, Asia B, DO   acetaminophen (TYLENOL) tablet 650 mg, 650 mg, Oral, Q6H PRN, Kristopher Oppenheim, DO, 650 mg at 01/15/23 2123   ALPRAZolam (XANAX) tablet 0.5 mg, 0.5 mg, Oral, QHS PRN, Zierle-Ghosh, Asia B, DO, 0.5 mg at 01/17/23 0419   alum & mag hydroxide-simeth (MAALOX/MYLANTA) 200-200-20 MG/5ML suspension 15 mL, 15 mL, Oral, Q4H PRN, Starla Link, Kshitiz, MD   apixaban (ELIQUIS) tablet 5 mg, 5 mg, Oral, BID, Palikh, Velna Ochs, MD, 5 mg at 01/17/23 1032   butalbital-acetaminophen-caffeine (FIORICET) 50-325-40 MG per tablet 2 tablet, 2 tablet, Oral, Q6H PRN, Dorene Grebe, MD, 2 tablet at 01/17/23 0019   calcium carbonate (TUMS - dosed in mg elemental calcium) chewable tablet 400 mg of elemental calcium, 2 tablet, Oral, TID PRN, Dwyane Dee, MD, 400 mg of elemental calcium at  01/16/23 0807   diphenhydrAMINE (BENADRYL) injection 12.5 mg, 12.5 mg, Intravenous, Q6H PRN, 12.5 mg at 01/15/23 1025 **AND** metoCLOPramide (REGLAN) injection 10 mg, 10 mg, Intravenous, Q6H PRN, Dwyane Dee, MD, 10 mg at 86/76/72 0947   folic acid (FOLVITE) tablet 1 mg, 1 mg, Oral, Daily, Zierle-Ghosh, Asia B, DO, 1 mg at 01/17/23 1032   melatonin tablet 6 mg, 6 mg, Oral, QHS PRN, Zierle-Ghosh, Asia B, DO, 6 mg at 01/16/23 2127   multivitamin with minerals tablet 1 tablet, 1 tablet, Oral, Daily, Zierle-Ghosh, Asia B, DO, 1 tablet at 01/17/23 1031   ondansetron (ZOFRAN) injection 4 mg, 4 mg, Intravenous, Q6H PRN, Zierle-Ghosh, Asia B, DO, 4 mg at 01/16/23 1817   pantoprazole (PROTONIX) EC tablet 40 mg, 40 mg, Oral, BID, Alekh, Kshitiz, MD, 40 mg at 01/17/23 1031   PARoxetine (PAXIL) tablet 40 mg, 40 mg, Oral, q AM, Zierle-Ghosh, Asia B, DO, 40 mg at 01/17/23 1030   rosuvastatin (CRESTOR) tablet 20 mg, 20 mg, Oral, Daily, Zierle-Ghosh, Asia B, DO, 20 mg at 01/17/23 1032   senna-docusate (Senokot-S) tablet 1 tablet, 1 tablet, Oral, QHS PRN, Zierle-Ghosh, Asia B, DO, 1 tablet at 01/12/23 1414   Patients Current Diet:  Diet Order                  Diet - low sodium heart healthy             DIET DYS 3 Room service appropriate? Yes; Fluid consistency: Thin  Diet effective now                         Precautions / Restrictions Precautions Precautions: Fall Restrictions Weight Bearing Restrictions: No    Has the patient had 2 or more falls or a fall with injury in the past year? Yes   Prior Activity Level Community (5-7x/wk): working and exercising consistently, no DME, driving   Prior Functional Level Self Care: Did the patient need help bathing, dressing, using the toilet or eating? Independent   Indoor Mobility: Did the patient need assistance with walking from room to room (with or without device)? Independent   Stairs: Did the patient need assistance with internal or  external stairs (with or without device)? Independent   Functional Cognition: Did the patient need help planning regular tasks such as shopping or remembering to take medications? Independent   Patient Information Are you of Hispanic, Latino/a,or Spanish origin?: A. No, not of Hispanic, Latino/a, or Spanish origin What is your race?: A. White Do you need or want an interpreter  to communicate with a doctor or health care staff?: 0. No   Patient's Response To:  Health Literacy and Transportation Is the patient able to respond to health literacy and transportation needs?: Yes Health Literacy - How often do you need to have someone help you when you read instructions, pamphlets, or other written material from your doctor or pharmacy?: Never In the past 12 months, has lack of transportation kept you from medical appointments or from getting medications?: No In the past 12 months, has lack of transportation kept you from meetings, work, or from getting things needed for daily living?: No   Development worker, international aid / [place] Devices/Equipment: None Home Equipment: None   Prior Device Use: Indicate devices/aids used by the patient prior to current illness, exacerbation or injury? None of the above   Current Functional Level Cognition   Arousal/Alertness: Awake/alert Overall Cognitive Status: Impaired/Different from baseline Current Attention Level: Selective Orientation Level: Oriented X4 Safety/Judgement: Decreased awareness of safety, Decreased awareness of deficits General Comments: needs cueing for attention to tasks, easily distractable and verbose; poor awareness of midline and when she is losing her balance left or posterior Attention: Sustained Sustained Attention: Impaired Sustained Attention Impairment: Verbal basic, Functional basic Memory: Impaired Memory Impairment: Decreased recall of new information, Decreased short term memory Decreased Short Term Memory:  Verbal basic, Functional basic Awareness: Impaired Awareness Impairment: Intellectual impairment Problem Solving: Impaired Problem Solving Impairment: Verbal complex, Functional complex Behaviors: Perseveration Safety/Judgment: Other (comment) (appeared WFL for tasks assessed)    Extremity Assessment (includes Sensation/Coordination)   Upper Extremity Assessment: Defer to OT evaluation  Lower Extremity Assessment: LLE deficits/detail LLE Deficits / Details: hip flexion 2+, hip extension 2+; knee extension 2+, ankle DF 2+ LLE Sensation: WNL (pt denies numbness)     ADLs   Overall ADL's : Needs assistance/impaired Eating/Feeding: Bed level, Modified independent Grooming: Set up, Sitting Grooming Details (indicate cue type and reason): up to mod A for sitting balance Upper Body Bathing: Maximal assistance, Sitting, Moderate assistance Upper Body Bathing Details (indicate cue type and reason): due to poor sitting balance Lower Body Bathing: Maximal assistance, Sit to/from stand, +2 for physical assistance Upper Body Dressing : Maximal assistance, Sitting Upper Body Dressing Details (indicate cue type and reason): due to poor sitting balance Lower Body Dressing: Maximal assistance, +2 for safety/equipment, +2 for physical assistance, Sit to/from stand Toilet Transfer: Moderate assistance, +2 for physical assistance, +2 for safety/equipment Toilet Transfer Details (indicate cue type and reason): completing functional mobility with max cues to advance L leg and assist from OT and PT to safely advance gait with RW Toileting- Clothing Manipulation and Hygiene: Total assistance, Sit to/from stand, +2 for physical assistance Toileting - Clothing Manipulation Details (indicate cue type and reason): +2 to maintain standing balance and total A for pericare Functional mobility during ADLs: Moderate assistance, +2 for physical assistance, +2 for safety/equipment General ADL Comments: Session focus on  increasing standing balance, functional mobility,  and awareness of midline with use of mirror     Mobility   Overal bed mobility: Needs Assistance Bed Mobility: Supine to Sit Rolling: Supervision Sidelying to sit: Min assist, HOB elevated Supine to sit: HOB elevated, Min guard Sit to sidelying: Mod assist General bed mobility comments: pt up in chair     Transfers   Overall transfer level: Needs assistance Equipment used: Rolling walker (2 wheels) Transfers: Sit to/from Stand Sit to Stand: Min assist Bed to/from chair/wheelchair/BSC transfer type:: Step pivot Step pivot transfers:  Mod assist, +2 physical assistance Transfer via Lift Equipment: Stedy General transfer comment: vc for sequencing; assist for anterior shift over BOS     Ambulation / Gait / Stairs / Wheelchair Mobility   Ambulation/Gait Ambulation/Gait assistance: Mod assist, +2 safety/equipment Gait Distance (Feet): 5 Feet (x2) Assistive device: Rolling walker (2 wheels) Gait Pattern/deviations: Step-to pattern, Decreased stance time - left, Decreased weight shift to right, Knee hyperextension - left, Narrow base of support General Gait Details: Tactile/verbal cues for neutral positioning, weight shifting to rt, bigger L step Increased L lateral lean with distractions, requiring up to modA. Chair follow Gait velocity: decreased Gait velocity interpretation: <1.8 ft/sec, indicate of risk for recurrent falls Pre-gait activities: assisted wt-shift to rt with pt still unable to advance LLE     Posture / Balance Dynamic Sitting Balance Sitting balance - Comments: left lean Balance Overall balance assessment: Needs assistance Sitting-balance support: Single extremity supported, Feet supported Sitting balance-Leahy Scale: Poor Sitting balance - Comments: left lean Standing balance support: Bilateral upper extremity supported, Reliant on assistive device for balance Standing balance-Leahy Scale: Poor Standing balance  comment: left and posterior lean     Special needs/care consideration Special service needs f/u with Dr. Julien Nordmann for oncology    Previous Home Environment (from acute therapy documentation) Living Arrangements: Spouse/significant other  Lives With: Spouse Available Help at Discharge: Family Type of Home: House Home Layout: One level Home Access: Stairs to enter Entrance Stairs-Rails: Right, Left Entrance Stairs-Number of Steps: 4 Bathroom Shower/Tub: Multimedia programmer: Programmer, systems: Yes Home Care Services: No   Discharge Living Setting Plans for Discharge Living Setting: Patient's home, Lives with (comment) (spouse [name])) Type of Home at Discharge: House Discharge Home Layout: One level Discharge Home Access: Stairs to enter Entrance Stairs-Rails: None Entrance Stairs-Number of Steps: 2 Discharge Bathroom Shower/Tub: Gaffer, Tub/shower unit Discharge Bathroom Toilet: Standard Discharge Bathroom Accessibility: Yes How Accessible: Accessible via walker Does the patient have any problems obtaining your medications?: No   Social/Family/Support Systems Patient Roles: Spouse Anticipated Caregiver: Adanya Sosinski (sp) and [employer] (granddaughter) Anticipated Ambulance person Information: [name] [phone]); [name] [phone]) Ability/Limitations of Caregiver: can provide supervision to guarding assist Caregiver Availability: 24/7 Discharge Plan Discussed with Primary Caregiver: Yes Is Caregiver In Agreement with Plan?: Yes Does Caregiver/Family have Issues with Lodging/Transportation while Pt is in Rehab?: No   Goals Patient/Family Goal for Rehab: PT/OT/SLP supervision Expected length of stay: 12-14 days Cultural Considerations: n/a Additional Information: new NSCLC diagnosis stage IV, undergoing chemo which is on hold until after rehab.  Discharge plan to pt's home with spouse able to provide 24/7 supervision and grand  daughter able to assist intermittently if needed Pt/Family Agrees to Admission and willing to participate: Yes Program Orientation Provided & Reviewed with Pt/Caregiver Including Roles  & Responsibilities: Yes  Barriers to Discharge: Insurance for SNF coverage   Decrease burden of Care through IP rehab admission: n/a   Possible need for SNF placement upon discharge: Not anticipated. Pt with discharge plan to her home with her spouse providing 24/7 supervision.    Patient Condition: I have reviewed medical records from [facility], spoken with  [facility] team , and patient, spouse, and family member. I met with patient at the bedside for inpatient rehabilitation assessment.  Patient will benefit from ongoing PT, OT, and SLP, can actively participate in 3 hours of therapy a day 5 days of the week, and can make measurable gains during the admission.  Patient will also benefit  from the coordinated team approach during an Inpatient Acute Rehabilitation admission.  The patient will receive intensive therapy as well as Rehabilitation physician, nursing, social worker, and care management interventions.  Due to bladder management, bowel management, safety, skin/wound care, disease management, medication administration, pain management, and patient education the patient requires 24 hour a day rehabilitation nursing.  The patient is currently mod assist with mobility and basic ADLs.  Discharge setting and therapy post discharge at home with home health is anticipated.  Patient has agreed to participate in the Acute Inpatient Rehabilitation Program and will admit today.   Preadmission Screen Completed By:  Michel Santee, PT, DPT 01/17/2023 10:36 AM ______________________________________________________________________   Discussed status with Dr. Tressa Busman on 01/17/23  at 10:45 AM  and received approval for admission today.   Admission Coordinator:  Michel Santee, PT, DPT time 10:45 AM Sudie Grumbling 01/17/23      Assessment/Plan: Diagnosis: Does the need for close, 24 hr/day Medical supervision in concert with the patient's rehab needs make it unreasonable for this patient to be served in a less intensive setting? Yes Co-Morbidities requiring supervision/potential complications: Bilateral stroke with L hemiparesis, dysphagia, cognitive impairments, persistent headache, persistent nausea, Stage IV NSCLC adenocarcinoma  on chemotherapy,  hypertension, anxiety, urinary incontinence and anemia Due to bladder management, safety, skin/wound care, disease management, medication administration, and patient education, does the patient require 24 hr/day rehab nursing? Yes Does the patient require coordinated care of a physician, rehab nurse, PT, OT, and SLP to address physical and functional deficits in the context of the above medical diagnosis(es)? Yes Addressing deficits in the following areas: balance, endurance, locomotion, strength, transferring, bowel/bladder control, bathing, dressing, feeding, grooming, toileting, cognition, and swallowing Can the patient actively participate in an intensive therapy program of at least 3 hrs of therapy 5 days a week? Yes The potential for patient to make measurable gains while on inpatient rehab is good Anticipated functional outcomes upon discharge from inpatient rehab: supervision PT, supervision OT, supervision SLP Estimated rehab length of stay to reach the above functional goals is: 12-14 days Anticipated discharge destination: Home 10. Overall Rehab/Functional Prognosis: good     MD Signature:   Gertie Gowda, DO 01/17/2023

## 2023-01-17 NOTE — Progress Notes (Signed)
Inpatient Rehabilitation Admission Medication Review by a Pharmacist  A complete drug regimen review was completed for this patient to identify any potential clinically significant medication issues.  High Risk Drug Classes Is patient taking? Indication by Medication  Antipsychotic Yes Prochlorperazine - nausea  Anticoagulant Yes Apixaban - recent embolic CVA  Antibiotic No   Opioid No   Antiplatelet No   Hypoglycemics/insulin No   Vasoactive Medication No   Chemotherapy No   Other Yes Famotidine, Protonix - GERD s/p esophageal dilatation Folic acid, MVI - vitamins Acetaminophen - Lower back pain, headaches Paroxetine - anxiety/depression Rosuvastatin - hyperlipidemia Topiramate - headaches Lidocaine patch - local measures for neck pain and headaches Alprazolam - anxiety/ depression Maalox - GERD Benzonatate, duoneb-cough associated with stage IV NSCLC Bisacodyl, Miralax, Fleet enema - constipation Cyclobenzapine - muscle spasms Diphenhydramine - itching Robitussin DM - cough Melatonin, Trazodone - insomnia      Type of Medication Issue Identified Description of Issue Recommendation(s)  Drug Interaction(s) (clinically significant)     Duplicate Therapy     Allergy     No Medication Administration End Date     Incorrect Dose     Additional Drug Therapy Needed      Significant med changes from prior encounter (inform family/care partners about these prior to discharge).    Other  Not resumed from home meds  - Vit D Valsartan/HCTZ 160/25 - holding for right now with SBP 100-140  Will continue to f/u SBP trends and restart if able    Clinically significant medication issues were identified that warrant physician communication and completion of prescribed/recommended actions by midnight of the next day:  No  Time spent performing this drug regimen review (minutes):  25 minutes   Sherlon Handing, PharmD, BCPS Please see amion for complete clinical pharmacist phone  list 01/17/2023 4:28 PM

## 2023-01-17 NOTE — Progress Notes (Signed)
Will shortly remove pt IV and wheel her to CIR

## 2023-01-17 NOTE — Progress Notes (Signed)
Mobility Specialist: Progress Note   01/17/23 1330  Mobility  Activity Ambulated with assistance in room  Level of Assistance Moderate assist, patient does 50-74%  Assistive Device Front wheel walker  Distance Ambulated (ft) 8 ft (4'x2)  Activity Response Tolerated well  Mobility Referral Yes  $Mobility charge 1 Mobility   Pt received in the bed and agreeable to mobility. MinA with bed mobility and modA to stand. C/o mild dizziness upon sitting EOB, resolved quickly. Able to take a few steps x2 from EOB with modA d/t posterior lean. No c/o during ambulation. Pt back to bed after session with call bell and phone in reach. Bed alarm is on.   [place] [name] Mobility Specialist Please contact via SecureChat or Rehab office at [phone]

## 2023-01-17 NOTE — H&P (Shared)
Physical Medicine and Rehabilitation Admission H&P    Chief Complaint  Patient presents with   Functional deficits due to stroke    HPI:  Patricia [name] is a 77 year old female with history of GERD, dysphagia s/p esophageal dilatation, L-VC paralysis, depression, Stage IV NSLC w/extensive lymphadenopathy diagnosed 12/23 and started on chemo last month. She was admitted on 01/10/23 with 2 week history of HA who developed acute onset of LLE weakness and vertigo with difficulty walking on day of admission. MRI brain done revealing small scattered acute and subacute infarcts in bilateral cerebral hemispheres and enhancing nodule right frontal cortex likley subacute infarct w/recommendations to repeat in 6-8 weeks to rule out malignancy. Neurology felt that stroke embolic v/s due to hypercoag state due to cancer. 2D echo showed EF 60-65% with grade IDD.   She was started on DAPT with question of DOAC was discussed with Dr. Earlie Server. She was started on Eliquis 02/06 with recs to repeat MRI in 6-8 weeks. MBS done 02/05 revealing normal swallow function and D3, thins recommended with limited distractions. She continues to be limited by left sided weakness with left lean, decreased awareness of deficits, poor attention with verbosity and cognitive deficits affecting ADLs and mobility. CIR recommended due to functional decline.     Review of Systems  Constitutional:  Negative for chills and fever.  HENT:  Negative for hearing loss.   Eyes:  Negative for blurred vision.  Respiratory:  Positive for cough, shortness of breath and wheezing.   Cardiovascular:  Negative for chest pain.  Gastrointestinal:  Positive for nausea (with coughing spells) and vomiting (due to coughing spells.). Negative for constipation.  Genitourinary:  Negative for dysuria.  Musculoskeletal:  Positive for back pain (felt to be due to SQ tumors), myalgias and neck pain.  Skin:  Negative for rash.  Neurological:  Positive for  dizziness, speech change, weakness and headaches.  Psychiatric/Behavioral:  The patient has insomnia (due to aweful HA and cough).     Past Medical History:  Diagnosis Date   Depression    GERD (gastroesophageal reflux disease)    Hypercholesterolemia    IFG (impaired fasting glucose)    Osteopenia    PONV (postoperative nausea and vomiting)     Past Surgical History:  Procedure Laterality Date   ABDOMINAL HYSTERECTOMY     CATARACT EXTRACTION W/PHACO  09/12/2011   Procedure: CATARACT EXTRACTION PHACO AND INTRAOCULAR LENS PLACEMENT ([place]);  Surgeon: Tonny Branch;  Location: [facility];  Service: Ophthalmology;  Laterality: Left;  CDE: 8.91   CATARACT EXTRACTION W/PHACO  11/18/2011   Procedure: CATARACT EXTRACTION PHACO AND INTRAOCULAR LENS PLACEMENT ([facility]);  Surgeon: Tonny Branch;  Location: [facility];  Service: Ophthalmology;  Laterality: Right;  CDE:10.26   COLONOSCOPY  2007   Dr. Gala Romney: internal hemorrhoids, single anal papilla poor prep.    COLONOSCOPY N/A 03/27/2017   [place]k: Normal exam.   ECTOPIC PREGNANCY SURGERY     removal of right tube   ESOPHAGOGASTRODUODENOSCOPY (EGD) WITH PROPOFOL N/A 09/10/2021   Procedure: ESOPHAGOGASTRODUODENOSCOPY (EGD) WITH PROPOFOL;  Surgeon: Daneil Dolin, MD;  Location: [facility];  Service: Endoscopy;  Laterality: N/A;  8:15AM   MALONEY DILATION N/A 09/10/2021   Procedure: Venia Minks DILATION;  Surgeon: Daneil Dolin, MD;  Location: [facility];  Service: Endoscopy;  Laterality: N/A;   OVARIAN CYST REMOVAL     right   RECTOCELE REPAIR N/A 01/18/2014   Procedure: POSTERIOR REPAIR (RECTOCELE);  Surgeon: Jonnie Kind, MD;  Location: [facility];  Service: Gynecology;  Laterality: N/A;   VAGINAL HYSTERECTOMY N/A 01/18/2014   Procedure: HYSTERECTOMY VAGINAL;  Surgeon: Jonnie Kind, MD;  Location: [facility];  Service: Gynecology;  Laterality: N/A;    Family History  Problem Relation Age of Onset   Heart attack Father    Osteoporosis Mother    Heart  attack Brother 31       MI   Breast cancer Maternal Aunt    Breast cancer Paternal Aunt    Cancer Paternal Aunt        ovarian cancer   Anesthesia problems Neg Hx    Hypotension Neg Hx    Malignant hyperthermia Neg Hx    Pseudochol deficiency Neg Hx    Colon cancer Neg Hx     Social History: Married. Was working till Jan 23rd and worked out 4-5 days week.  She  reports that she quit smoking about 38 years ago. Her smoking use included cigarettes. She has a 20.00 pack-year smoking history. She has never used smokeless tobacco. She reports that she does not drink alcohol and does not use drugs.   Allergies  Allergen Reactions   Codeine Nausea Only    Medications Prior to Admission  Medication Sig Dispense Refill   Cholecalciferol (VITAMIN D3) 125 MCG (5000 UT) CAPS Take 5,000 Units by mouth daily.     folic acid (FOLVITE) 1 MG tablet Take 1 tablet (1 mg total) by mouth daily. 30 tablet 4   lansoprazole (PREVACID) 30 MG capsule Take one po qd for acid reflux 90 capsule 1   meclizine (ANTIVERT) 25 MG tablet Take one tablet po TID prn dizziness 30 tablet 0   Multiple Vitamin (MULTIVITAMIN WITH MINERALS) TABS tablet Take 1 tablet by mouth daily.     naproxen sodium (ALEVE) 220 MG tablet Take 220 mg by mouth daily as needed (pain).     PARoxetine (PAXIL) 40 MG tablet TAKE 1 TABLET BY MOUTH EVERY DAY IN THE MORNING (Patient taking differently: Take 40 mg by mouth in the morning.) 90 tablet 2   prochlorperazine (COMPAZINE) 10 MG tablet Take 1 tablet (10 mg total) by mouth every 6 (six) hours as needed for nausea or vomiting. 30 tablet 0   rosuvastatin (CRESTOR) 20 MG tablet Take 1 tablet (20 mg total) by mouth daily. 100 tablet 1   valsartan-hydrochlorothiazide (DIOVAN-HCT) 160-25 MG tablet TAKE 1 TABLET BY MOUTH EVERY DAY 90 tablet 1   ALPRAZolam (XANAX) 0.5 MG tablet TAKE (1) TABLET BY MOUTH AT BEDTIME AS NEEDED FOR SLEEP. (Patient taking differently: Take 0.5 mg by mouth at bedtime as  needed for anxiety or sleep. TAKE (1) TABLET BY MOUTH AT BEDTIME AS NEEDED FOR SLEEP.) 30 tablet 1   lidocaine (XYLOCAINE) 2 % solution SMARTSIG:By Mouth (Patient not taking: Reported on 01/10/2023)     magic mouthwash (nystatin, lidocaine, diphenhydrAMINE, alum & mag hydroxide) suspension Swish and spit 5 mLs 3 (three) times daily as needed for mouth pain. (Patient not taking: Reported on 01/10/2023) 180 mL 0      Home: Home Living Family/patient expects to be discharged to:: Skilled nursing facility Living Arrangements: Spouse/significant other Available Help at Discharge: Family Type of Home: House Home Access: Stairs to enter [employer] of Steps: 4 Entrance Stairs-Rails: Right, Left Home Layout: One level Bathroom Shower/Tub: Multimedia programmer: Standard Bathroom Accessibility: Yes Home Equipment: None  Lives With: Spouse   Functional History: Prior Function Prior Level of Function : Independent/Modified Independent Mobility  Comments: working out 4-5 days/week; working 10 hr days  Functional Status:  Mobility: Bed Mobility Overal bed mobility: Needs Assistance Bed Mobility: Supine to Sit Rolling: Supervision Sidelying to sit: Min assist, HOB elevated Supine to sit: HOB elevated, Min guard Sit to sidelying: Mod assist General bed mobility comments: pt up in chair Transfers Overall transfer level: Needs assistance Equipment used: Rolling walker (2 wheels) Transfers: Sit to/from Stand Sit to Stand: Min assist Bed to/from chair/wheelchair/BSC transfer type:: Step pivot Step pivot transfers: Mod assist, +2 physical assistance Transfer via Lift Equipment: Stedy General transfer comment: vc for sequencing; assist for anterior shift over BOS Ambulation/Gait Ambulation/Gait assistance: Mod assist, +2 safety/equipment Gait Distance (Feet): 5 Feet (x2) Assistive device: Rolling walker (2 wheels) Gait Pattern/deviations: Step-to pattern, Decreased stance  time - left, Decreased weight shift to right, Knee hyperextension - left, Narrow base of support General Gait Details: Tactile/verbal cues for neutral positioning, weight shifting to rt, bigger L step Increased L lateral lean with distractions, requiring up to modA. Chair follow Gait velocity: decreased Gait velocity interpretation: <1.8 ft/sec, indicate of risk for recurrent falls Pre-gait activities: assisted wt-shift to rt with pt still unable to advance LLE    ADL: ADL Overall ADL's : Needs assistance/impaired Eating/Feeding: Bed level, Modified independent Grooming: Set up, Sitting Grooming Details (indicate cue type and reason): up to mod A for sitting balance Upper Body Bathing: Maximal assistance, Sitting, Moderate assistance Upper Body Bathing Details (indicate cue type and reason): due to poor sitting balance Lower Body Bathing: Maximal assistance, Sit to/from stand, +2 for physical assistance Upper Body Dressing : Maximal assistance, Sitting Upper Body Dressing Details (indicate cue type and reason): due to poor sitting balance Lower Body Dressing: Maximal assistance, +2 for safety/equipment, +2 for physical assistance, Sit to/from stand Toilet Transfer: Moderate assistance, +2 for physical assistance, +2 for safety/equipment Toilet Transfer Details (indicate cue type and reason): completing functional mobility with max cues to advance L leg and assist from OT and PT to safely advance gait with RW Toileting- Clothing Manipulation and Hygiene: Total assistance, Sit to/from stand, +2 for physical assistance Toileting - Clothing Manipulation Details (indicate cue type and reason): +2 to maintain standing balance and total A for pericare Functional mobility during ADLs: Moderate assistance, +2 for physical assistance, +2 for safety/equipment General ADL Comments: Session focus on increasing standing balance, functional mobility,  and awareness of midline with use of  mirror  Cognition: Cognition Overall Cognitive Status: Impaired/Different from baseline Arousal/Alertness: Awake/alert Orientation Level: Oriented X4 Year: 2024 Month: February Day of Week: Correct Attention: Sustained Sustained Attention: Impaired Sustained Attention Impairment: Verbal basic, Functional basic Memory: Impaired Memory Impairment: Decreased recall of new information, Decreased short term memory Decreased Short Term Memory: Verbal basic, Functional basic Awareness: Impaired Awareness Impairment: Intellectual impairment Problem Solving: Impaired Problem Solving Impairment: Verbal complex, Functional complex Behaviors: Perseveration Safety/Judgment: Other (comment) (appeared WFL for tasks assessed) Cognition Arousal/Alertness: Awake/alert Behavior During Therapy: WFL for tasks assessed/performed Overall Cognitive Status: Impaired/Different from baseline Area of Impairment: Attention, Safety/judgement, Awareness, Problem solving Current Attention Level: Selective Safety/Judgement: Decreased awareness of safety, Decreased awareness of deficits Awareness: Emergent Problem Solving: Requires verbal cues, Requires tactile cues, Difficulty sequencing General Comments: needs cueing for attention to tasks, easily distractable and verbose; poor awareness of midline and when she is losing her balance left or posterior   Blood pressure 125/60, pulse 65, temperature 97.8 F (36.6 C), temperature source Oral, resp. rate 19, height 4\' 10"  (1.473 m), weight 63 kg, SpO2  96 %. Physical Exam Constitutional:      Comments: Fatigued appearing. Was lying in bed with towel over her head (reports HA worse w/cough)    Pulmonary:     Comments: Audible wheeze with cough at times.  Skin:    Comments: Nodules right arm  Neurological:     Mental Status: She is alert.     Comments: Speaks in a whisper. Mild right facial droop. Decrease in motor control on left with weakness.      No  results found for this or any previous visit (from the past 48 hour(s)). No results found.    Blood pressure 125/60, pulse 65, temperature 97.8 F (36.6 C), temperature source Oral, resp. rate 19, height 4\' 10"  (1.473 m), weight 63 kg, SpO2 96 %.  Medical Problem List and Plan: 1. Functional deficits secondary to ***  -patient may *** shower  -ELOS/Goals: *** 2. Antithrombotics: -DVT/anticoagulation:  Pharmaceutical: Eliquis  -antiplatelet therapy: N/A 3. LBP/Pain Management: Will continue tylenol for now.  --May need something stronger for HA. Reports back pain better since chemo.   4. Mood/Behavior/Sleep: LCSW to follow for evaluation and support.    --Melatonin prn for insomnia.  --Treat cough -rule out aspiration/post obstructive infection.    -antipsychotic agents: N/A 5. Neuropsych/cognition: This patient is capable of making decisions on her own behalf. 6. Skin/Wound Care: Routine pressure relief measures.  7. Fluids/Electrolytes/Nutrition: Monitor I/O. Check CMET in am.  8. HTN: Monitor BP TID--continue to hold Valsartan and HCTZ.   --SBP trending 100- 140 range. Avoid hypoperfusion  9. Stage IV  NSCLC w/lymphadenopathy/muscular nodules: Chemo to resume after discharge.  --Duo nebs qid added w/tessalon perles prn --may need pulmonary involved if no better.   10. GERD s/p esophageal dilatation: Continue Protonix. Will add pepcid also. 11. Anxiety/depression: On Paxil daily w/xanax prn.  12. Hyperlipidemia: On Crestor.  13. Neck pain/Headaches: Will add Topamax 25 mg bid.   --lidocaine patch for local measures.  14. Horseness/L-VC paralysis : Has been ongoing since August-->a little worse.  15. Dizziness: Vestibular eval and treat.     ***  Bary Leriche, PA-C 01/17/2023

## 2023-01-17 NOTE — H&P (Signed)
Expand All Collapse All      Physical Medicine and Rehabilitation Admission H&P        Chief Complaint  Patient presents with   Functional deficits due to stroke      HPI:  Patricia [name] is a 77 year old female with history of GERD, dysphagia s/p esophageal dilatation, L-VC paralysis, depression, Stage IV NSLC w/extensive lymphadenopathy diagnosed 12/23 and started on chemo last month. She was admitted on 01/10/23 with 2 week history of HA who developed acute onset of LLE weakness and vertigo with difficulty walking on day of admission. MRI brain done revealing small scattered acute and subacute infarcts in bilateral cerebral hemispheres and enhancing nodule right frontal cortex likley subacute infarct w/recommendations to repeat in 6-8 weeks to rule out malignancy. Neurology felt that stroke embolic v/s due to hypercoag state due to cancer. 2D echo showed EF 60-65% with grade IDD.    She was started on DAPT with question of DOAC was discussed with Dr. Earlie Server. She was started on Eliquis 02/06 with recs to repeat MRI in 6-8 weeks. MBS done 02/05 revealing normal swallow function and D3, thins recommended with limited distractions. She continues to be limited by left sided weakness with left lean, decreased awareness of deficits, poor attention with verbosity and cognitive deficits affecting ADLs and mobility. CIR recommended due to functional decline.        Review of Systems  Constitutional:  Negative for chills and fever.  HENT:  Negative for hearing loss.   Eyes:  Negative for blurred vision.  Respiratory:  Positive for cough, shortness of breath and wheezing.   Cardiovascular:  Negative for chest pain.  Gastrointestinal:  Positive for nausea (with coughing spells) and vomiting (due to coughing spells.). Negative for constipation.  Genitourinary:  Negative for dysuria.  Musculoskeletal:  Positive for back pain (felt to be due to SQ tumors), myalgias and neck pain.  Skin:  Negative  for rash.  Neurological:  Positive for dizziness, speech change, weakness and headaches.  Psychiatric/Behavioral:  The patient has insomnia (due to aweful HA and cough).           Past Medical History:  Diagnosis Date   Depression     GERD (gastroesophageal reflux disease)     Hypercholesterolemia     IFG (impaired fasting glucose)     Osteopenia     PONV (postoperative nausea and vomiting)             Past Surgical History:  Procedure Laterality Date   ABDOMINAL HYSTERECTOMY       CATARACT EXTRACTION W/PHACO   09/12/2011    Procedure: CATARACT EXTRACTION PHACO AND INTRAOCULAR LENS PLACEMENT ([place]);  Surgeon: Tonny Branch;  Location: [facility];  Service: Ophthalmology;  Laterality: Left;  CDE: 8.91   CATARACT EXTRACTION W/PHACO   11/18/2011    Procedure: CATARACT EXTRACTION PHACO AND INTRAOCULAR LENS PLACEMENT ([facility]);  Surgeon: Tonny Branch;  Location: [facility];  Service: Ophthalmology;  Laterality: Right;  CDE:10.26   COLONOSCOPY   2007    Dr. Gala Romney: internal hemorrhoids, single anal papilla poor prep.    COLONOSCOPY N/A 03/27/2017    Rourk: Normal exam.   ECTOPIC PREGNANCY SURGERY        removal of right tube   ESOPHAGOGASTRODUODENOSCOPY (EGD) WITH PROPOFOL N/A 09/10/2021    Procedure: ESOPHAGOGASTRODUODENOSCOPY (EGD) WITH PROPOFOL;  Surgeon: Daneil Dolin, MD;  Location: [facility];  Service: Endoscopy;  Laterality: N/A;  8:15AM   MALONEY DILATION N/A 09/10/2021  Procedure: MALONEY DILATION;  Surgeon: Daneil Dolin, MD;  Location: [facility];  Service: Endoscopy;  Laterality: N/A;   OVARIAN CYST REMOVAL        right   RECTOCELE REPAIR N/A 01/18/2014    Procedure: POSTERIOR REPAIR (RECTOCELE);  Surgeon: Jonnie Kind, MD;  Location: [facility];  Service: Gynecology;  Laterality: N/A;   VAGINAL HYSTERECTOMY N/A 01/18/2014    Procedure: HYSTERECTOMY VAGINAL;  Surgeon: Jonnie Kind, MD;  Location: [facility];  Service: Gynecology;  Laterality: N/A;           Family History   Problem Relation Age of Onset   Heart attack Father     Osteoporosis Mother     Heart attack Brother 18        MI   Breast cancer Maternal Aunt     Breast cancer Paternal Aunt     Cancer Paternal Aunt          ovarian cancer   Anesthesia problems Neg Hx     Hypotension Neg Hx     Malignant hyperthermia Neg Hx     Pseudochol deficiency Neg Hx     Colon cancer Neg Hx        Social History: Married. Was working till Jan 23rd and worked out 4-5 days week.  She  reports that she quit smoking about 38 years ago. Her smoking use included cigarettes. She has a 20.00 pack-year smoking history. She has never used smokeless tobacco. She reports that she does not drink alcohol and does not use drugs.         Allergies  Allergen Reactions   Codeine Nausea Only            Medications Prior to Admission  Medication Sig Dispense Refill   Cholecalciferol (VITAMIN D3) 125 MCG (5000 UT) CAPS Take 5,000 Units by mouth daily.       folic acid (FOLVITE) 1 MG tablet Take 1 tablet (1 mg total) by mouth daily. 30 tablet 4   lansoprazole (PREVACID) 30 MG capsule Take one po qd for acid reflux 90 capsule 1   meclizine (ANTIVERT) 25 MG tablet Take one tablet po TID prn dizziness 30 tablet 0   Multiple Vitamin (MULTIVITAMIN WITH MINERALS) TABS tablet Take 1 tablet by mouth daily.       naproxen sodium (ALEVE) 220 MG tablet Take 220 mg by mouth daily as needed (pain).       PARoxetine (PAXIL) 40 MG tablet TAKE 1 TABLET BY MOUTH EVERY DAY IN THE MORNING (Patient taking differently: Take 40 mg by mouth in the morning.) 90 tablet 2   prochlorperazine (COMPAZINE) 10 MG tablet Take 1 tablet (10 mg total) by mouth every 6 (six) hours as needed for nausea or vomiting. 30 tablet 0   rosuvastatin (CRESTOR) 20 MG tablet Take 1 tablet (20 mg total) by mouth daily. 100 tablet 1   valsartan-hydrochlorothiazide (DIOVAN-HCT) 160-25 MG tablet TAKE 1 TABLET BY MOUTH EVERY DAY 90 tablet 1   ALPRAZolam (XANAX) 0.5 MG tablet  TAKE (1) TABLET BY MOUTH AT BEDTIME AS NEEDED FOR SLEEP. (Patient taking differently: Take 0.5 mg by mouth at bedtime as needed for anxiety or sleep. TAKE (1) TABLET BY MOUTH AT BEDTIME AS NEEDED FOR SLEEP.) 30 tablet 1   lidocaine (XYLOCAINE) 2 % solution SMARTSIG:By Mouth (Patient not taking: Reported on 01/10/2023)       magic mouthwash (nystatin, lidocaine, diphenhydrAMINE, alum & mag hydroxide) suspension Swish and spit 5 mLs  3 (three) times daily as needed for mouth pain. (Patient not taking: Reported on 01/10/2023) 180 mL 0          Home: Home Living Family/patient expects to be discharged to:: Skilled nursing facility Living Arrangements: Spouse/significant other Available Help at Discharge: Family Type of Home: House Home Access: Stairs to enter Technical brewer of Steps: 4 Entrance Stairs-Rails: Right, Left Home Layout: One level Bathroom Shower/Tub: Multimedia programmer: Programmer, systems: Yes Home Equipment: None  Lives With: Spouse   Functional History: Prior Function Prior Level of Function : Independent/Modified Independent Mobility Comments: working out 4-5 days/week; working 10 hr days   Functional Status:  Mobility: Bed Mobility Overal bed mobility: Needs Assistance Bed Mobility: Supine to Sit Rolling: Supervision Sidelying to sit: Min assist, HOB elevated Supine to sit: HOB elevated, Min guard Sit to sidelying: Mod assist General bed mobility comments: pt up in chair Transfers Overall transfer level: Needs assistance Equipment used: Rolling walker (2 wheels) Transfers: Sit to/from Stand Sit to Stand: Min assist Bed to/from chair/wheelchair/BSC transfer type:: Step pivot Step pivot transfers: Mod assist, +2 physical assistance Transfer via Lift Equipment: Stedy General transfer comment: vc for sequencing; assist for anterior shift over BOS Ambulation/Gait Ambulation/Gait assistance: Mod assist, +2 safety/equipment Gait  Distance (Feet): 5 Feet (x2) Assistive device: Rolling walker (2 wheels) Gait Pattern/deviations: Step-to pattern, Decreased stance time - left, Decreased weight shift to right, Knee hyperextension - left, Narrow base of support General Gait Details: Tactile/verbal cues for neutral positioning, weight shifting to rt, bigger L step Increased L lateral lean with distractions, requiring up to modA. Chair follow Gait velocity: decreased Gait velocity interpretation: <1.8 ft/sec, indicate of risk for recurrent falls Pre-gait activities: assisted wt-shift to rt with pt still unable to advance LLE   ADL: ADL Overall ADL's : Needs assistance/impaired Eating/Feeding: Bed level, Modified independent Grooming: Set up, Sitting Grooming Details (indicate cue type and reason): up to mod A for sitting balance Upper Body Bathing: Maximal assistance, Sitting, Moderate assistance Upper Body Bathing Details (indicate cue type and reason): due to poor sitting balance Lower Body Bathing: Maximal assistance, Sit to/from stand, +2 for physical assistance Upper Body Dressing : Maximal assistance, Sitting Upper Body Dressing Details (indicate cue type and reason): due to poor sitting balance Lower Body Dressing: Maximal assistance, +2 for safety/equipment, +2 for physical assistance, Sit to/from stand Toilet Transfer: Moderate assistance, +2 for physical assistance, +2 for safety/equipment Toilet Transfer Details (indicate cue type and reason): completing functional mobility with max cues to advance L leg and assist from OT and PT to safely advance gait with RW Toileting- Clothing Manipulation and Hygiene: Total assistance, Sit to/from stand, +2 for physical assistance Toileting - Clothing Manipulation Details (indicate cue type and reason): +2 to maintain standing balance and total A for pericare Functional mobility during ADLs: Moderate assistance, +2 for physical assistance, +2 for safety/equipment General ADL  Comments: Session focus on increasing standing balance, functional mobility,  and awareness of midline with use of mirror   Cognition: Cognition Overall Cognitive Status: Impaired/Different from baseline Arousal/Alertness: Awake/alert Orientation Level: Oriented X4 Year: 2024 Month: February Day of Week: Correct Attention: Sustained Sustained Attention: Impaired Sustained Attention Impairment: Verbal basic, Functional basic Memory: Impaired Memory Impairment: Decreased recall of new information, Decreased short term memory Decreased Short Term Memory: Verbal basic, Functional basic Awareness: Impaired Awareness Impairment: Intellectual impairment Problem Solving: Impaired Problem Solving Impairment: Verbal complex, Functional complex Behaviors: Perseveration Safety/Judgment: Other (comment) (appeared WFL for tasks assessed)  Cognition Arousal/Alertness: Awake/alert Behavior During Therapy: WFL for tasks assessed/performed Overall Cognitive Status: Impaired/Different from baseline Area of Impairment: Attention, Safety/judgement, Awareness, Problem solving Current Attention Level: Selective Safety/Judgement: Decreased awareness of safety, Decreased awareness of deficits Awareness: Emergent Problem Solving: Requires verbal cues, Requires tactile cues, Difficulty sequencing General Comments: needs cueing for attention to tasks, easily distractable and verbose; poor awareness of midline and when she is losing her balance left or posterior     Blood pressure 125/60, pulse 65, temperature 97.8 F (36.6 C), temperature source Oral, resp. rate 19, height 4\' 10"  (1.473 m), weight 63 kg, SpO2 96 %. Physical Exam  Constitutional: No apparent distress. Appropriate appearance for age.  HENT: No JVD. Atraumatic, normocephalic. Eyes: PERRLA. EOMI. Visual fields grossly intact. + right-beating nystagmus, worse when looking to right than left, with double vision when looking R Cardiovascular:  RRR, no murmurs/rub/gallops. No Edema. Peripheral pulses 2+  Respiratory: CTAB. No rales; mild central rhonchi; audible expiratory wheezing. On RA. +Weak cough effort.  Abdomen: + bowel sounds, normoactive. No distention or tenderness.  Skin: C/D/I. No apparent lesions. MSK:      No apparent deformity.      Strength:                RUE: 5/5 SA, 5/5 EF, 5/5 EE, 5/5 WE, 5/5 FF, 5/5 FA                 LUE: 4/5 SA, 4/5 EF, 4/5 EE, 4/5 WE, 4/5 FF, 4/5 FA                 RLE: 5/5 HF, 5/5 KE, 5/5 DF, 5/5 EHL, 5/5 PF                 LLE:  4/5 HF, 4/5 KE, 4/5 DF, 4/5 EHL, 4/5 PF   Neurologic exam:  Cognition: AAO to person, place, time and event.  Language: Fluent, No substitutions or neoglisms. +Hoarse, breathy vocal quality. Names 3/3 objects correctly.  Memory: Recalls 2/3 objects at 5 minutes.  Insight: Good insight into current condition.  Mood: Pleasant affect, appropriate mood.  Sensation: To light touch intact in BL UEs and LEs  Reflexes: 2+ in BL UE and LEs. Negative Hoffman's and babinski signs bilaterally. 2-3 beats clonus with ankle jerk bilaterally.  CN: 2-12 grossly intact.  Coordination: No apparent tremors. No ataxia on FTN, HTS bilaterally.  Spasticity: MAS 0 in all extremities.         Lab Results Last 48 Hours  No results found for this or any previous visit (from the past 48 hour(s)).   Imaging Results (Last 48 hours)  No results found.         Blood pressure 125/60, pulse 65, temperature 97.8 F (36.6 C), temperature source Oral, resp. rate 19, height 4\' 10"  (1.473 m), weight 63 kg, SpO2 96 %.   Medical Problem List and Plan: 1. 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 metastatic             -patient may shower             -ELOS/Goals: 12-14 days, SPV PT/OT/SLP goals 2. Antithrombotics: -DVT/anticoagulation:  Pharmaceutical: Eliquis 5 mg BID             -antiplatelet therapy: N/A - was on ASA and Plavix prior to  stroke, now on Eliquis only  3. LBP/Pain Management: Will continue tylenol for now.  --May need something stronger for  HA. Reports back pain better since chemo.   -- Tylenol, flexeril, fioricet PRN  4. Mood/Behavior/Sleep: LCSW to follow for evaluation and support.                       --Melatonin, Trazodone prn for insomnia.        -antipsychotic agents: N/A 5. Neuropsych/cognition: This patient is capable of making decisions on her own behalf. 6. Skin/Wound Care: Routine pressure relief measures.  7. Fluids/Electrolytes/Nutrition: Monitor I/O. Check CMET in am.  8. HTN: Monitor BP TID--continue to hold Valsartan and HCTZ.              --SBP trending 100- 140 range. Avoid hypoperfusion     01/17/2023    8:27 PM 01/17/2023    4:01 PM 01/17/2023    3:51 PM  Vitals with BMI  Height  4' 9.992" 4\' 11"   Weight  142 lbs 3 oz 137 lbs 13 oz  BMI  38.33 38.32  Systolic 919  166  Diastolic 71  66  Pulse 75  69     9. Stage IV  NSCLC w/lymphadenopathy/muscular nodules: Chemo to resume after discharge. --Per neurology, repeat MRI brain w/ and w/o in 6-8 weeks to continue to monitor for potential metastatic disease.  10. GERD s/p esophageal dilatation: Continue Protonix. Will add pepcid also. 11. Anxiety/depression: On Paxil daily w/xanax prn.  12. Hyperlipidemia: On Crestor.  13. Neck pain/Headaches: On PRN Fioricet 2 tabs Q6H, using consistently.  Will add Topamax 25 mg bid.              --lidocaine patch for local measures.              -- Flexeril 5 mg TID PRN for muscle tightness in neck             -- bilateral occipital nerve blocks may be helpful if persistent, however in setting of metastic disease with multiple SQ nodules will hold off  14. Horseness/L-VC paralysis : Has been ongoing since August-->a little worse.               -- SLP for swallow, speech              -- ISB  15. Dizziness/nausea/double vision: R-beating nystagmus on exam. Vestibular eval and treat.  16. Persistent  cough since CVA. Causing cervicalgia, HA's, and nausea/vomiting with PO intakes.              --Treat cough -rule out aspiration/post obstructive infection.                -- Duo nebs qid added w/tessalon perles prn --may need pulmonary involved if no better.      Bary Leriche, PA-C 01/17/2023  I have examined the patient independently and edited the note for HPI, ROS, exam, assessment, and plan as appropriate. I am in agreement with the above recommendations.   Gertie Gowda, DO 01/17/2023

## 2023-01-17 NOTE — PMR Pre-admission (Signed)
PMR Admission Coordinator Pre-Admission Assessment  Patient: Patricia [name] is an 77 y.o., female MRN: [id number] DOB: 11-Feb-1946 Height: 4\' 10"  (147.3 cm) Weight: 63 kg  Insurance Information HMO:     PPO: yes     PCP:      IPA:      80/20:      OTHER:  PRIMARY: [employer] Medicare      Policy#: [id number]      Subscriber: pt CM Name: Joellen Jersey      Phone#: not provided     Fax#: [phone] Pre-Cert#: [id number] auth for CIR provided from [place] with [facility] with updates due to Santiago Glad [phone]) at fax listed above on 2/15      Employer:  Benefits:  Phone #: [phone]     Name:  Eff. Date: 12/09/22     Deduct: $0      Out of Pocket Max: $4500 (met $375)      Life Max:  CIR: $295/day for days 1-6      SNF: 20 full days Outpatient:      Co-Pay: $20/visit Home Health: 100%      Co-Pay:  DME: 80%     Co-Pay: 20% Providers:  SECONDARY:       Policy#:      Phone#:   Development worker, community:       Phone#:   The Therapist, art Information Summary" for patients in Inpatient Rehabilitation Facilities with attached "Privacy Act [place] Records" was provided and verbally reviewed with: Patient and Family  Emergency Contact Information Contact Information     Name Relation Home Work Mobile   Paschal,Heather Granddaughter [phone]  [phone]   [place] Spouse [phone]  [phone]       Current Medical History  Patient Admitting Diagnosis: CVA   History of Present Illness: Pt is a 77 y/o  female with PMH of depression, GERD, and new dx of non-small cell lung cancer currently undergoing treatment, followed by Dr. Earlie Server.  Pt presented to [facility] on 01/10/23 with LLE weakness, vertigo, and several falls.  Vitals in ED WNL, thrombocytopenia related to oncologic treatments, but otherwise labs unremarkable.  Neuro was consulted for concern for CVA and recommended full workup.  MRI showed small scattered acute and subacute infarcts in the bilateral hemispheres.   Pt to continue eliquis for [facility].  Therapy ongoing and recommendations are for CIR.     Complete NIHSS TOTAL: 4  [facility]'s medical record from [facility] has been reviewed by the rehabilitation admission coordinator and physician.  Past Medical History  Past Medical History:  Diagnosis Date   Depression    GERD (gastroesophageal reflux disease)    Hypercholesterolemia    IFG (impaired fasting glucose)    Osteopenia    PONV (postoperative nausea and vomiting)     Has the patient had major surgery during 100 days prior to admission? No  Family History   family history includes Breast cancer in her maternal aunt and paternal aunt; Cancer in her paternal aunt; Heart attack in her father; Heart attack (age of onset: 34) in her brother; Osteoporosis in her mother.  Current Medications  Current Facility-Administered Medications:    acetaminophen (TYLENOL) tablet 650 mg, 650 mg, Oral, Q4H PRN, 650 mg at 01/17/23 0418 **OR** acetaminophen (TYLENOL) 160 MG/5ML solution 650 mg, 650 mg, Per Tube, Q4H PRN **OR** acetaminophen (TYLENOL) suppository 650 mg, 650 mg, Rectal, Q4H PRN, Zierle-Ghosh, Asia B, DO   acetaminophen (TYLENOL) tablet 650 mg, 650 mg, Oral, Q6H PRN, Kristopher Oppenheim,  DO, 650 mg at 01/15/23 2123   ALPRAZolam (XANAX) tablet 0.5 mg, 0.5 mg, Oral, QHS PRN, Zierle-Ghosh, Asia B, DO, 0.5 mg at 01/17/23 0419   alum & mag hydroxide-simeth (MAALOX/MYLANTA) 200-200-20 MG/5ML suspension 15 mL, 15 mL, Oral, Q4H PRN, Starla Link, Kshitiz, MD   apixaban (ELIQUIS) tablet 5 mg, 5 mg, Oral, BID, Palikh, Velna Ochs, MD, 5 mg at 01/17/23 1032   butalbital-acetaminophen-caffeine (FIORICET) 50-325-40 MG per tablet 2 tablet, 2 tablet, Oral, Q6H PRN, Dorene Grebe, MD, 2 tablet at 01/17/23 0019   calcium carbonate (TUMS - dosed in mg elemental calcium) chewable tablet 400 mg of elemental calcium, 2 tablet, Oral, TID PRN, Dwyane Dee, MD, 400 mg of elemental calcium at 01/16/23 0807   diphenhydrAMINE (BENADRYL)  injection 12.5 mg, 12.5 mg, Intravenous, Q6H PRN, 12.5 mg at 01/15/23 1025 **AND** metoCLOPramide (REGLAN) injection 10 mg, 10 mg, Intravenous, Q6H PRN, Dwyane Dee, MD, 10 mg at 45/80/99 8338   folic acid (FOLVITE) tablet 1 mg, 1 mg, Oral, Daily, Zierle-Ghosh, Asia B, DO, 1 mg at 01/17/23 1032   melatonin tablet 6 mg, 6 mg, Oral, QHS PRN, Zierle-Ghosh, Asia B, DO, 6 mg at 01/16/23 2127   multivitamin with minerals tablet 1 tablet, 1 tablet, Oral, Daily, Zierle-Ghosh, Asia B, DO, 1 tablet at 01/17/23 1031   ondansetron (ZOFRAN) injection 4 mg, 4 mg, Intravenous, Q6H PRN, Zierle-Ghosh, Asia B, DO, 4 mg at 01/16/23 1817   pantoprazole (PROTONIX) EC tablet 40 mg, 40 mg, Oral, BID, Alekh, Kshitiz, MD, 40 mg at 01/17/23 1031   PARoxetine (PAXIL) tablet 40 mg, 40 mg, Oral, q AM, Zierle-Ghosh, Asia B, DO, 40 mg at 01/17/23 1030   rosuvastatin (CRESTOR) tablet 20 mg, 20 mg, Oral, Daily, Zierle-Ghosh, Asia B, DO, 20 mg at 01/17/23 1032   senna-docusate (Senokot-S) tablet 1 tablet, 1 tablet, Oral, QHS PRN, Zierle-Ghosh, Asia B, DO, 1 tablet at 01/12/23 1414  Patients Current Diet:  Diet Order             Diet - low sodium heart healthy           DIET DYS 3 Room service appropriate? Yes; Fluid consistency: Thin  Diet effective now                   Precautions / Restrictions Precautions Precautions: Fall Restrictions Weight Bearing Restrictions: No   Has the patient had 2 or more falls or a fall with injury in the past year? Yes  Prior Activity Level Community (5-7x/wk): working and exercising consistently, no DME, driving  Prior Functional Level Self Care: Did the patient need help bathing, dressing, using the toilet or eating? Independent  Indoor Mobility: Did the patient need assistance with walking from room to room (with or without device)? Independent  Stairs: Did the patient need assistance with internal or external stairs (with or without device)? Independent  Functional  Cognition: Did the patient need help planning regular tasks such as shopping or remembering to take medications? Independent  Patient Information Are you of Hispanic, Latino/a,or Spanish origin?: A. No, not of Hispanic, Latino/a, or Spanish origin What is your race?: A. White Do you need or want an interpreter to communicate with a doctor or health care staff?: 0. No  Patient's Response To:  Health Literacy and Transportation Is the patient able to respond to health literacy and transportation needs?: Yes Health Literacy - How often do you need to have someone help you when you read instructions, pamphlets, or other written  material from your doctor or pharmacy?: Never In the past 12 months, has lack of transportation kept you from medical appointments or from getting medications?: No In the past 12 months, has lack of transportation kept you from meetings, work, or from getting things needed for daily living?: No  Development worker, international aid / [place] Devices/Equipment: None Home Equipment: None  Prior Device Use: Indicate devices/aids used by the patient prior to current illness, exacerbation or injury? None of the above  Current Functional Level Cognition  Arousal/Alertness: Awake/alert Overall Cognitive Status: Impaired/Different from baseline Current Attention Level: Selective Orientation Level: Oriented X4 Safety/Judgement: Decreased awareness of safety, Decreased awareness of deficits General Comments: needs cueing for attention to tasks, easily distractable and verbose; poor awareness of midline and when she is losing her balance left or posterior Attention: Sustained Sustained Attention: Impaired Sustained Attention Impairment: Verbal basic, Functional basic Memory: Impaired Memory Impairment: Decreased recall of new information, Decreased short term memory Decreased Short Term Memory: Verbal basic, Functional basic Awareness: Impaired Awareness Impairment:  Intellectual impairment Problem Solving: Impaired Problem Solving Impairment: Verbal complex, Functional complex Behaviors: Perseveration Safety/Judgment: Other (comment) (appeared WFL for tasks assessed)    Extremity Assessment (includes Sensation/Coordination)  Upper Extremity Assessment: Defer to OT evaluation  Lower Extremity Assessment: LLE deficits/detail LLE Deficits / Details: hip flexion 2+, hip extension 2+; knee extension 2+, ankle DF 2+ LLE Sensation: WNL (pt denies numbness)    ADLs  Overall ADL's : Needs assistance/impaired Eating/Feeding: Bed level, Modified independent Grooming: Set up, Sitting Grooming Details (indicate cue type and reason): up to mod A for sitting balance Upper Body Bathing: Maximal assistance, Sitting, Moderate assistance Upper Body Bathing Details (indicate cue type and reason): due to poor sitting balance Lower Body Bathing: Maximal assistance, Sit to/from stand, +2 for physical assistance Upper Body Dressing : Maximal assistance, Sitting Upper Body Dressing Details (indicate cue type and reason): due to poor sitting balance Lower Body Dressing: Maximal assistance, +2 for safety/equipment, +2 for physical assistance, Sit to/from stand Toilet Transfer: Moderate assistance, +2 for physical assistance, +2 for safety/equipment Toilet Transfer Details (indicate cue type and reason): completing functional mobility with max cues to advance L leg and assist from OT and PT to safely advance gait with RW Toileting- Clothing Manipulation and Hygiene: Total assistance, Sit to/from stand, +2 for physical assistance Toileting - Clothing Manipulation Details (indicate cue type and reason): +2 to maintain standing balance and total A for pericare Functional mobility during ADLs: Moderate assistance, +2 for physical assistance, +2 for safety/equipment General ADL Comments: Session focus on increasing standing balance, functional mobility,  and awareness of midline  with use of mirror    Mobility  Overal bed mobility: Needs Assistance Bed Mobility: Supine to Sit Rolling: Supervision Sidelying to sit: Min assist, HOB elevated Supine to sit: HOB elevated, Min guard Sit to sidelying: Mod assist General bed mobility comments: pt up in chair    Transfers  Overall transfer level: Needs assistance Equipment used: Rolling walker (2 wheels) Transfers: Sit to/from Stand Sit to Stand: Min assist Bed to/from chair/wheelchair/BSC transfer type:: Step pivot Step pivot transfers: Mod assist, +2 physical assistance Transfer via Lift Equipment: Stedy General transfer comment: vc for sequencing; assist for anterior shift over BOS    Ambulation / Gait / Stairs / Wheelchair Mobility  Ambulation/Gait Ambulation/Gait assistance: Mod assist, +2 safety/equipment Gait Distance (Feet): 5 Feet (x2) Assistive device: Rolling walker (2 wheels) Gait Pattern/deviations: Step-to pattern, Decreased stance time - left, Decreased weight shift to  right, Knee hyperextension - left, Narrow base of support General Gait Details: Tactile/verbal cues for neutral positioning, weight shifting to rt, bigger L step Increased L lateral lean with distractions, requiring up to modA. Chair follow Gait velocity: decreased Gait velocity interpretation: <1.8 ft/sec, indicate of risk for recurrent falls Pre-gait activities: assisted wt-shift to rt with pt still unable to advance LLE    Posture / Balance Dynamic Sitting Balance Sitting balance - Comments: left lean Balance Overall balance assessment: Needs assistance Sitting-balance support: Single extremity supported, Feet supported Sitting balance-Leahy Scale: Poor Sitting balance - Comments: left lean Standing balance support: Bilateral upper extremity supported, Reliant on assistive device for balance Standing balance-Leahy Scale: Poor Standing balance comment: left and posterior lean    Special needs/care consideration Special service  needs f/u with Dr. Julien Nordmann for oncology   Previous Home Environment (from acute therapy documentation) Living Arrangements: Spouse/significant other  Lives With: Spouse Available Help at Discharge: Family Type of Home: House Home Layout: One level Home Access: Stairs to enter Entrance Stairs-Rails: Right, Left Entrance Stairs-Number of Steps: 4 Bathroom Shower/Tub: Multimedia programmer: Programmer, systems: Yes Home Care Services: No  Discharge Living Setting Plans for Discharge Living Setting: Patient's home, Lives with (comment) (spouse [name])) Type of Home at Discharge: House Discharge Home Layout: One level Discharge Home Access: Stairs to enter Entrance Stairs-Rails: None Entrance Stairs-Number of Steps: 2 Discharge Bathroom Shower/Tub: Gaffer, Tub/shower unit Discharge Bathroom Toilet: Standard Discharge Bathroom Accessibility: Yes How Accessible: Accessible via walker Does the patient have any problems obtaining your medications?: No  Social/Family/Support Systems Patient Roles: Spouse Anticipated Caregiver: Norva Bowe (sp) and [employer] (granddaughter) Anticipated Ambulance person Information: [name] [phone]); [name] [phone]) Ability/Limitations of Caregiver: can provide supervision to guarding assist Caregiver Availability: 24/7 Discharge Plan Discussed with Primary Caregiver: Yes Is Caregiver In Agreement with Plan?: Yes Does Caregiver/Family have Issues with Lodging/Transportation while Pt is in Rehab?: No  Goals Patient/Family Goal for Rehab: PT/OT/SLP supervision Expected length of stay: 12-14 days Cultural Considerations: n/a Additional Information: new NSCLC diagnosis stage IV, undergoing chemo which is on hold until after rehab.  Discharge plan to pt's home with spouse able to provide 24/7 supervision and grand daughter able to assist intermittently if needed Pt/Family Agrees to Admission and willing  to participate: Yes Program Orientation Provided & Reviewed with Pt/Caregiver Including Roles  & Responsibilities: Yes  Barriers to Discharge: Insurance for SNF coverage  Decrease burden of Care through IP rehab admission: n/a  Possible need for SNF placement upon discharge: Not anticipated. Pt with discharge plan to her home with her spouse providing 24/7 supervision.   Patient Condition: I have reviewed medical records from [facility], spoken with  [facility] team , and patient, spouse, and family member. I met with patient at the bedside for inpatient rehabilitation assessment.  Patient will benefit from ongoing PT, OT, and SLP, can actively participate in 3 hours of therapy a day 5 days of the week, and can make measurable gains during the admission.  Patient will also benefit from the coordinated team approach during an Inpatient Acute Rehabilitation admission.  The patient will receive intensive therapy as well as Rehabilitation physician, nursing, social worker, and care management interventions.  Due to bladder management, bowel management, safety, skin/wound care, disease management, medication administration, pain management, and patient education the patient requires 24 hour a day rehabilitation nursing.  The patient is currently mod assist with mobility and basic ADLs.  Discharge setting and therapy post discharge  at home with home health is anticipated.  Patient has agreed to participate in the Acute Inpatient Rehabilitation Program and will admit today.  Preadmission Screen Completed By:  Michel Santee, PT, DPT 01/17/2023 10:36 AM ______________________________________________________________________   Discussed status with Dr. Tressa Busman on 01/17/23  at 10:45 AM  and received approval for admission today.  Admission Coordinator:  Michel Santee, PT, DPT time 10:45 AM Sudie Grumbling 01/17/23    Assessment/Plan: Diagnosis: Does the need for close, 24 hr/day Medical supervision in concert with the  patient's rehab needs make it unreasonable for this patient to be served in a less intensive setting? Yes Co-Morbidities requiring supervision/potential complications: Bilateral stroke with L hemiparesis, dysphagia, cognitive impairments, persistent headache, persistent nausea, Stage IV NSCLC adenocarcinoma  on chemotherapy,  hypertension, anxiety, urinary incontinence and anemia Due to bladder management, safety, skin/wound care, disease management, medication administration, and patient education, does the patient require 24 hr/day rehab nursing? Yes Does the patient require coordinated care of a physician, rehab nurse, PT, OT, and SLP to address physical and functional deficits in the context of the above medical diagnosis(es)? Yes Addressing deficits in the following areas: balance, endurance, locomotion, strength, transferring, bowel/bladder control, bathing, dressing, feeding, grooming, toileting, cognition, and swallowing Can the patient actively participate in an intensive therapy program of at least 3 hrs of therapy 5 days a week? Yes The potential for patient to make measurable gains while on inpatient rehab is good Anticipated functional outcomes upon discharge from inpatient rehab: supervision PT, supervision OT, supervision SLP Estimated rehab length of stay to reach the above functional goals is: 12-14 days Anticipated discharge destination: Home 10. Overall Rehab/Functional Prognosis: good   MD Signature:  Gertie Gowda, DO 01/17/2023

## 2023-01-17 NOTE — Plan of Care (Signed)

## 2023-01-17 NOTE — Progress Notes (Signed)
Inpatient Rehab Admissions Coordinator:    I have insurance approval and a bed available for pt to admit to CIR today. Dr. Starla Link in agreement.  Will let pt/family and TOC team know.   Shann Medal, PT, DPT Admissions Coordinator [phone] 01/17/23  10:29 AM

## 2023-01-18 DIAGNOSIS — R519 Headache, unspecified: Secondary | ICD-10-CM | POA: Diagnosis not present

## 2023-01-18 DIAGNOSIS — M79605 Pain in left leg: Secondary | ICD-10-CM | POA: Diagnosis not present

## 2023-01-18 DIAGNOSIS — I6340 Cerebral infarction due to embolism of unspecified cerebral artery: Secondary | ICD-10-CM | POA: Diagnosis not present

## 2023-01-18 HISTORY — DX: Headache, unspecified: R51.9

## 2023-01-18 LAB — CBC WITH DIFFERENTIAL/PLATELET
Abs Immature Granulocytes: 0.02 10*3/uL (ref 0.00–0.07)
Basophils Absolute: 0 10*3/uL (ref 0.0–0.1)
Basophils Relative: 1 %
Eosinophils Absolute: 0.9 10*3/uL — ABNORMAL HIGH (ref 0.0–0.5)
Eosinophils Relative: 14 %
HCT: 31.8 % — ABNORMAL LOW (ref 36.0–46.0)
Hemoglobin: 10.7 g/dL — ABNORMAL LOW (ref 12.0–15.0)
Immature Granulocytes: 0 %
Lymphocytes Relative: 27 %
Lymphs Abs: 1.7 10*3/uL (ref 0.7–4.0)
MCH: 29.6 pg (ref 26.0–34.0)
MCHC: 33.6 g/dL (ref 30.0–36.0)
MCV: 88.1 fL (ref 80.0–100.0)
Monocytes Absolute: 0.7 10*3/uL (ref 0.1–1.0)
Monocytes Relative: 10 %
Neutro Abs: 3 10*3/uL (ref 1.7–7.7)
Neutrophils Relative %: 48 %
Platelets: 199 10*3/uL (ref 150–400)
RBC: 3.61 MIL/uL — ABNORMAL LOW (ref 3.87–5.11)
RDW: 14.2 % (ref 11.5–15.5)
WBC: 6.3 10*3/uL (ref 4.0–10.5)
nRBC: 0 % (ref 0.0–0.2)

## 2023-01-18 LAB — COMPREHENSIVE METABOLIC PANEL
ALT: 22 U/L (ref 0–44)
AST: 20 U/L (ref 15–41)
Albumin: 3.3 g/dL — ABNORMAL LOW (ref 3.5–5.0)
Alkaline Phosphatase: 76 U/L (ref 38–126)
Anion gap: 10 (ref 5–15)
BUN: 11 mg/dL (ref 8–23)
CO2: 24 mmol/L (ref 22–32)
Calcium: 9.2 mg/dL (ref 8.9–10.3)
Chloride: 103 mmol/L (ref 98–111)
Creatinine, Ser: 0.84 mg/dL (ref 0.44–1.00)
GFR, Estimated: >60 mL/min (ref >60)
Glucose, Bld: 104 mg/dL — ABNORMAL HIGH (ref 70–99)
Potassium: 4 mmol/L (ref 3.5–5.1)
Sodium: 137 mmol/L (ref 135–145)
Total Bilirubin: 0.6 mg/dL (ref 0.3–1.2)
Total Protein: 5.9 g/dL — ABNORMAL LOW (ref 6.5–8.1)

## 2023-01-18 MED ORDER — DOCUSATE SODIUM 100 MG PO CAPS: 100.0000 mg | ORAL_CAPSULE | Freq: Every day | ORAL | Status: DC | PRN

## 2023-01-18 MED ORDER — POLYETHYLENE GLYCOL 3350 17 G PO PACK
17.0000 g | PACK | Freq: Every day | ORAL | Status: DC
  Administered 2023-01-18 – 2023-01-19 (×2): 17 g via ORAL
  Filled 2023-01-18 (×2): qty 1

## 2023-01-18 MED ORDER — IPRATROPIUM-ALBUTEROL 0.5-2.5 (3) MG/3ML IN SOLN: 3.0000 mL | Freq: Four times a day (QID) | RESPIRATORY_TRACT | Status: DC | PRN

## 2023-01-18 MED ORDER — DOCUSATE SODIUM 100 MG PO CAPS
100.0000 mg | ORAL_CAPSULE | Freq: Every day | ORAL | Status: DC
  Administered 2023-01-18 – 2023-01-19 (×2): 100 mg via ORAL
  Filled 2023-01-18 (×2): qty 1

## 2023-01-18 NOTE — Progress Notes (Signed)
Physical Therapy Note  Patient Details  Name: Patricia [name] MRN: [id number] Date of Birth: [birth year] Today's Date: 01/18/2023    Therapist arrived to perform PT eval; however, pt currently vomiting in bed with husband assisting her. Therapist provided washcloths and notified nurse who was present to provide medication. Pt continues to vomit light brown and spit up thick, clear secretions for next ~36minutes. Pt politely declines participation in PT eval at this time. Will return as scheduling allows.  Tawana Scale , PT, DPT, NCS, CSRS 01/18/2023, 10:41 AM

## 2023-01-18 NOTE — Progress Notes (Signed)
PROGRESS NOTE   Subjective/Complaints:  Pt doing ok today, had a dizzy spell with HA during PT but states this is what she's been experiencing since the stroke. Currently improved. Family states she was receiving IV meds before, ?migraine cocktail, but they're not sure exactly what it was. Fioricet was ordered last night, seems to help.   Slept ok, had a BM yesterday but requests stool softeners as she has had rectal surgery in the past and has always needed stool softeners. Urinating well.   Nursing later calling to say PT informed them that patient had LLE pain. Orders for b/l DVT study placed.   ROS: +HA, +dizziness-resolved, +LLE pain (per report). Denies fevers, chills, CP, SOB, abd pain, N/V/D/C, new/worsening paresthesias/weakness, or any other complaints at this time.     Objective:   DG CHEST PORT 1 VIEW  Result Date: 01/17/2023 CLINICAL DATA:  Dyspnea, weakness EXAM: PORTABLE CHEST 1 VIEW COMPARISON:  03/26/2005 FINDINGS: No focal consolidation. No pleural effusion or pneumothorax. Heart and mediastinal contours are unremarkable. No acute osseous abnormality. IMPRESSION: No active disease. Electronically Signed   By: Kathreen Devoid M.D.   On: 01/17/2023 14:08   Recent Labs    01/18/23 0615  WBC 6.3  HGB 10.7*  HCT 31.8*  PLT 199   Recent Labs    01/18/23 0615  NA 137  K 4.0  CL 103  CO2 24  GLUCOSE 104*  BUN 11  CREATININE 0.84  CALCIUM 9.2    Intake/Output Summary (Last 24 hours) at 01/18/2023 0826 Last data filed at 01/17/2023 1930 Gross per 24 hour  Intake 240 ml  Output 200 ml  Net 40 ml        Physical Exam: Vital Signs Blood pressure 123/72, pulse 93, temperature 99 F (37.2 C), temperature source Oral, resp. rate 17, height 4' 9.99" (1.473 m), weight 64.5 kg, SpO2 93 %.   Constitutional: No apparent distress. Appropriate appearance for age. Sitting in w/c HENT: No JVD. Atraumatic,  normocephalic. Eyes: PERRLA. EOMI. Visual fields grossly intact. + right-beating nystagmus, worse when looking to right than left, with double vision when looking R Cardiovascular: RRR, no murmurs/rub/gallops. No Edema. Peripheral pulses 2+  Respiratory: CTAB. No rales; no rhonchi noted this morning but poor effort, no definite wheezing heard. No distress or increased WOB.  Abdomen: + bowel sounds, normoactive. No distention or tenderness.  Skin: C/D/I. No apparent lesions. MSK:      No apparent deformity.      Strength:                RUE: 5/5 SA, 5/5 EF, 5/5 EE, 5/5 WE, 5/5 FF, 5/5 FA                 LUE: 4/5 SA, 4/5 EF, 4/5 EE, 4/5 WE, 4/5 FF, 4/5 FA                 RLE: 5/5 HF, 5/5 KE, 5/5 DF, 5/5 EHL, 5/5 PF                 LLE:  4/5 HF, 4/5 KE, 4/5 DF, 4/5 EHL, 4/5 PF    Neurologic exam:  Cognition: AAO to person, place, time and event.  Language: Fluent, No substitutions or neoglisms. +Hoarse, breathy vocal quality. Names 3/3 objects correctly.  Memory: Recalls 2/3 objects at 5 minutes.  Insight: Good insight into current condition.  Mood: Pleasant affect, appropriate mood.  Sensation: To light touch intact in BL UEs and LEs  Reflexes: 2+ in BL UE and LEs. Negative Hoffman's and babinski signs bilaterally. 2-3 beats clonus with ankle jerk bilaterally.  CN: 2-12 grossly intact.  Coordination: No apparent tremors. No ataxia on FTN, HTS bilaterally.  Spasticity: MAS 0 in all extremities.   Assessment/Plan: 1. Functional deficits which require 3+ hours per day of interdisciplinary therapy in a comprehensive inpatient rehab setting. Physiatrist is providing close team supervision and 24 hour management of active medical problems listed below. Physiatrist and rehab team continue to assess barriers to discharge/monitor patient progress toward functional and medical goals  Care Tool:  Bathing              Bathing assist       Upper Body Dressing/Undressing Upper body  dressing        Upper body assist      Lower Body Dressing/Undressing Lower body dressing            Lower body assist       Toileting Toileting    Toileting assist       Transfers Chair/bed transfer  Transfers assist           Locomotion Ambulation   Ambulation assist              Walk 10 feet activity   Assist           Walk 50 feet activity   Assist           Walk 150 feet activity   Assist           Walk 10 feet on uneven surface  activity   Assist           Wheelchair     Assist               Wheelchair 50 feet with 2 turns activity    Assist            Wheelchair 150 feet activity     Assist          Blood pressure 123/72, pulse 93, temperature 99 F (37.2 C), temperature source Oral, resp. rate 17, height 4' 9.99" (1.473 m), weight 64.5 kg, SpO2 93 %.  Medical Problem List and Plan: 1. 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 metastatic             -patient may shower             -ELOS/Goals: 12-14 days, SPV PT/OT/SLP goals 2. Antithrombotics: -DVT/anticoagulation:  Pharmaceutical: Eliquis 5 mg BID -antiplatelet therapy: N/A - was on ASA and Plavix prior to stroke, now on Eliquis only   3. LBP/Pain Management: Will continue tylenol 650mg  q6h PRN for now.  --May need something stronger for HA. Reports back pain better since chemo.   --Tylenol, flexeril 5mg  TID PRN, fioricet 2 tabs q6h PRN, Lidoderm patch QD   4. Mood/Behavior/Sleep: LCSW to follow for evaluation and support.                       --Melatonin 6mg  QHS PRN, Trazodone prn for insomnia.        -  antipsychotic agents: N/A 5. Neuropsych/cognition: This patient is capable of making decisions on her own behalf. 6. Skin/Wound Care: Routine pressure relief measures.  7. Fluids/Electrolytes/Nutrition: Monitor I/O. Monitor weekly labs starting 01/20/23 -01/18/23 CMP  unremarkable, monitor on weekly labs  8. HTN: Monitor BP TID--continue to hold Valsartan and HCTZ.              --SBP trending 100- 140 range. Avoid hypoperfusion   -01/18/23 BPs stable, cont to monitor Vitals:   01/17/23 1551 01/17/23 2027 01/18/23 0330  BP: 110/66 130/71 123/72      9. Stage IV  NSCLC w/lymphadenopathy/muscular nodules: Chemo to resume after discharge. --Per neurology, repeat MRI brain w/ and w/o in 6-8 weeks to continue to monitor for potential metastatic disease.  10. GERD s/p esophageal dilatation: Continue Protonix 40mg  BID. Will add pepcid 20mg  BID also. 11. Anxiety/depression: On Paxil 40mg  daily w/xanax 0.5mg  QHS prn.  12. Hyperlipidemia: On Crestor 20mg  QD 13. Neck pain/Headaches: On PRN Fioricet 2 tabs Q6H, using consistently.  Will add Topamax 25 mg bid.              --lidocaine patch for local measures.              -- Flexeril 5 mg TID PRN for muscle tightness in neck -- bilateral occipital nerve blocks may be helpful if persistent, however in setting of metastic disease with multiple SQ nodules will hold off  14. Horseness/L-VC paralysis : Has been ongoing since August-->a little worse.               -- SLP for swallow, speech              -- ISB  15. Dizziness/nausea/double vision: R-beating nystagmus on exam. Vestibular eval and treat.  -01/18/23 was getting Zofran inpatient, ordered 4mg  ODT q8h PRN n/v; also has Compazine PRN 16. Persistent cough since CVA. Causing cervicalgia, HA's, and nausea/vomiting with PO intakes.              --Treat cough -rule out aspiration/post obstructive infection.                -- Duo nebs qid added w/tessalon perles prn --may need pulmonary involved if no better.  -01/18/23 CXR yesterday without active disease; seems better today, though poor inspiratory effort. Continue Duonebs, Tessalon 200mg  TID PRN, Robitussin PRN, and monitor  17. Anemia: Hgb down to 10.5 on 01/10/23, stable at 10.7 on 01/18/23; monitor on weekly labs  starting 03/04/60  -Continue Folic acid 1mg  QD, multivitamins 18. Hx of rectal surgery, chronic constipation:  -01/18/23 LBM yesterday, but requests stool softeners given hx of rectal surgery. Ordered Miralax 17g QD + PRN, Colace 100mg  QD + PRN. Has Dulcolax supp PRN, Fleet's PRN.  19. LLE pain: nursing reporting this after assessment this morning 01/18/23, per PT reports-- PT concerned for DVT, no significant swelling noted on exam this morning -01/18/23 ordered doppler U/S bilateral LEs to r/o DVT given hx of malignancy, continue above pain meds   I spent >37mins on coordination of care d/t medication adjustments, discussion with nursing regarding LLE pain, reviewing labs/imaging, and discussing with family at bedside regarding plans.   LOS: 1 days A FACE TO [place] 01/18/2023, 8:26 AM

## 2023-01-18 NOTE — Evaluation (Signed)
Speech Language Pathology Assessment and Plan  Patient Details  Name: Patricia [name] MRN: [id number] Date of Birth: 19-Jun-1946  SLP Diagnosis: Cognitive Impairments;Dysphagia  Rehab Potential: Good ELOS: 12-14 days    Today's Date: 01/18/2023 SLP Individual Time: 1230-1330 SLP Individual Time Calculation (min): 38 min   Hospital Problem: Principal Problem:   Embolic stroke [facility])  Past Medical History:  Past Medical History:  Diagnosis Date   Depression    GERD (gastroesophageal reflux disease)    Hypercholesterolemia    IFG (impaired fasting glucose)    Osteopenia    PONV (postoperative nausea and vomiting)    Past Surgical History:  Past Surgical History:  Procedure Laterality Date   ABDOMINAL HYSTERECTOMY     CATARACT EXTRACTION W/PHACO  09/12/2011   Procedure: CATARACT EXTRACTION PHACO AND INTRAOCULAR LENS PLACEMENT (Christmas);  Surgeon: Tonny Branch;  Location: [facility];  Service: Ophthalmology;  Laterality: Left;  CDE: 8.91   CATARACT EXTRACTION W/PHACO  11/18/2011   Procedure: CATARACT EXTRACTION PHACO AND INTRAOCULAR LENS PLACEMENT (IOC);  Surgeon: Tonny Branch;  Location: [facility];  Service: Ophthalmology;  Laterality: Right;  CDE:10.26   COLONOSCOPY  2007   Dr. Gala Romney: internal hemorrhoids, single anal papilla poor prep.    COLONOSCOPY N/A 03/27/2017   Rourk: Normal exam.   ECTOPIC PREGNANCY SURGERY     removal of right tube   ESOPHAGOGASTRODUODENOSCOPY (EGD) WITH PROPOFOL N/A 09/10/2021   Procedure: ESOPHAGOGASTRODUODENOSCOPY (EGD) WITH PROPOFOL;  Surgeon: Daneil Dolin, MD;  Location: [facility];  Service: Endoscopy;  Laterality: N/A;  8:15AM   MALONEY DILATION N/A 09/10/2021   Procedure: Venia Minks DILATION;  Surgeon: Daneil Dolin, MD;  Location: [facility];  Service: Endoscopy;  Laterality: N/A;   OVARIAN CYST REMOVAL     right   RECTOCELE REPAIR N/A 01/18/2014   Procedure: POSTERIOR REPAIR (RECTOCELE);  Surgeon: Jonnie Kind, MD;  Location: [facility];  Service:  Gynecology;  Laterality: N/A;   VAGINAL HYSTERECTOMY N/A 01/18/2014   Procedure: HYSTERECTOMY VAGINAL;  Surgeon: Jonnie Kind, MD;  Location: [facility];  Service: Gynecology;  Laterality: N/A;    Assessment / Plan / Recommendation Clinical Impression   Patricia [name] is a 77 year old female with history of GERD, dysphagia s/p esophageal dilatation, L-VC paralysis, depression, Stage IV NSLC w/extensive lymphadenopathy diagnosed 12/23 and started on chemo last month. She was admitted on 01/10/23 with 2 week history of HA who developed acute onset of LLE weakness and vertigo with difficulty walking on day of admission. MRI brain done revealing small scattered acute and subacute infarcts in bilateral cerebral hemispheres and enhancing nodule right frontal cortex likley subacute infarct w/recommendations to repeat in 6-8 weeks to rule out malignancy. Neurology felt that stroke embolic v/s due to hypercoag state due to cancer. 2D echo showed EF 60-65% with grade IDD.    She was started on DAPT with question of DOAC was discussed with Dr. Earlie Server. She was started on Eliquis 02/06 with recs to repeat MRI in 6-8 weeks. MBS done 02/05 revealing normal swallow function and D3, thins recommended with limited distractions. She continues to be limited by left sided weakness with left lean, decreased awareness of deficits, poor attention with verbosity and cognitive deficits affecting ADLs and mobility. CIR recommended due to functional decline.    Skilled Therapeutic Interventions          Patient was seen this date for ST evaluation of cognitive linguistic skills and dysphagia.  Patient was sleeping upon SLP arrival;  however, upon waking, she was agreeable to complete assessment.  Pt reported she had been sick this morning and currently has a headache. At this time, pt was unable to have anymore medication (as stated by NSG). Pt was aphonic throughout session. She reports she has CA of the neck and now has vocal fold  paralysis. SLP explained how VF paralysis can impact airway protection.  Patient reports that she coughs with p.o. intake every day.  No coughing or throat clearing was observed during today's bedside evaluation.  See imaging for MBS report.  Previous SLP recommended continuation of Dys 3 diet with thin liquids; no mixed consistencies.  SLP provided education on mixed consistencies and demonstrated examples.  SLP recommended patient take medication with pure versus water.    Due to fatigue and illness, SLP completed shorter assessment using SLUMS.  Patient scored 13/30 on SLUMS indicating impaired cognition in the following areas re: memory, language, cognitive flexibility, executive functioning, and visuospatial skills.  Patient blamed headache on poor performance on assessment; however, was surprised by her score.  Patient reported the clock drawing should have been easier.  17 numbers appeared on the clock -in order, but perseverated.  Patient was unable to understand the time and required additional verbal and visual cueing.    Oral motor assessment was [facility].  SLP observed patient with lunch and no overt signs or symptoms of dysphagia were noted.  Patient reports this is how she prefers her food and would like to continue with current diet.  SLP rec continued Dys3 and thin liquids with medication in pure.    SLP Assessment  Patient will need skilled Speech Lanaguage Pathology Services during CIR admission    Recommendations  SLP Diet Recommendations: Dysphagia 3 (Mech soft);Thin Medication Administration: Whole meds with puree (due to impairments with mixed consistencies) Supervision: Patient able to self feed;Intermittent supervision to cue for compensatory strategies Compensations: Slow rate;Small sips/bites;Minimize environmental distractions;Other (Comment) Postural Changes and/or Swallow Maneuvers: Seated upright 90 degrees;Upright 30-60 min after meal Oral Care Recommendations: Oral care  BID;Patient independent with oral care Patient destination: Home Follow up Recommendations: Outpatient SLP Equipment Recommended: None recommended by SLP    SLP Frequency 3 to 5 out of 7 days   SLP Duration  SLP Intensity  SLP Treatment/Interventions 12-14 days  Minumum of 1-2 x/day, 30 to 90 minutes  Cognitive remediation/compensation;Environmental controls;Cueing hierarchy;Functional tasks;Therapeutic Activities;Internal/external aids;Therapeutic Exercise;Dysphagia/aspiration precaution training;Patient/family education    Pain Pain Assessment Pain Scale: 0-10 Pain Score: 7  Pain Type: Acute pain Pain Location: Head Pain Orientation: Anterior Pain Descriptors / Indicators: Headache Pain Onset: Gradual Pain Intervention(s): RN made aware  Prior Functioning Cognitive/Linguistic Baseline: Baseline deficits Baseline deficit details: Memory recall occasionally Type of Home: House  Lives With: Spouse [name]) Available Help at Discharge: Family;Available 24 hours/day Education: GED and 3 years of college Vocation: Full time employment  SLP Evaluation Cognition Overall Cognitive Status: Impaired/Different from baseline Arousal/Alertness: Awake/alert Orientation Level: Oriented X4 Year: 2024 Month: February Day of Week: Correct Attention: Focused;Sustained Focused Attention: Appears intact Sustained Attention: Impaired Sustained Attention Impairment: Verbal basic Memory: Impaired Memory Impairment: Decreased recall of new information;Decreased short term memory Decreased Short Term Memory: Verbal basic Awareness: Impaired Awareness Impairment: Emergent impairment Problem Solving: Appears intact Problem Solving Impairment: Verbal complex;Functional complex Executive Function: Reasoning;Sequencing;Organizing;Self Correcting Reasoning: Impaired Reasoning Impairment: Verbal complex Sequencing: Impaired Sequencing Impairment: Verbal complex Organizing:  Impaired Organizing Impairment: Verbal basic Self Monitoring: Impaired Self Monitoring Impairment: Verbal basic;Functional basic Self Correcting: Impaired Self Correcting  Impairment: Verbal basic Safety/Judgment: Appears intact [employer] Scales of Cognitive Functioning: Purposeful, Appropriate  Comprehension Auditory Comprehension Overall Auditory Comprehension: Appears within functional limits for tasks assessed Conversation: Complex EffectiveTechniques: Extra processing time;Repetition Reading Comprehension Reading Status: Within funtional limits Expression Expression Primary Mode of Expression: Verbal Verbal Expression Overall Verbal Expression: Appears within functional limits for tasks assessed Oral Motor Oral Motor/Sensory Function Overall Oral Motor/Sensory Function: Within functional limits Motor Speech Overall Motor Speech: Appears within functional limits for tasks assessed Respiration: Within functional limits Phonation: Breathy;Other (comment) Articulation: Within functional limitis Level of Impairment: Conversation Intelligibility: Intelligibility reduced Word: 75-100% accurate Phrase: 75-100% accurate Sentence: 75-100% accurate Conversation: 75-100% accurate Motor Planning: Witnin functional limits Motor Speech Errors: Not applicable Interfering Components: Premorbid status  Care Tool Care Tool Cognition Ability to hear (with hearing aid or hearing appliances if normally used Ability to hear (with hearing aid or hearing appliances if normally used): 0. Adequate - no difficulty in normal conservation, social interaction, listening to TV   Expression of Ideas and Wants Expression of Ideas and Wants: 3. Some difficulty - exhibits some difficulty with expressing needs and ideas (e.g, some words or finishing thoughts) or speech is not clear   Understanding Verbal and Non-Verbal Content Understanding Verbal and Non-Verbal Content: 3. Usually understands -  understands most conversations, but misses some part/intent of message. Requires cues at times to understand  Memory/Recall Ability Memory/Recall Ability : Current season;That he or she is in a hospital/hospital unit   PMSV Assessment  PMSV Trial Intelligibility: Intelligibility reduced Word: 75-100% accurate Phrase: 75-100% accurate Sentence: 75-100% accurate Conversation: 75-100% accurate  Bedside Swallowing Assessment General    Oral Care Assessment   Ice Chips Ice chips: Not tested Thin Liquid Thin Liquid: Impaired Presentation: Straw Other Comments: complaints of coughing with liquids during meals; not seen during CSE Nectar Thick Nectar Thick Liquid: Not tested Honey Thick Honey Thick Liquid: Not tested Puree Puree: Not tested Solid Solid: Impaired Pharyngeal Phase Impairments: Multiple swallows;Other (comments) BSE Assessment Suspected Esophageal Findings Suspected Esophageal Findings: Globus sensation;Regurgitation Risk for Aspiration Impact on safety and function: Mild aspiration risk Other Related Risk Factors: History of dysphagia;Previous CVA;Other (comment) (VF paralysis)  Short Term Goals: Week 1: SLP Short Term Goal 1 (Week 1): Pt will demonstrate increased error awareness during therapeutic task with minA verbal cues. SLP Short Term Goal 2 (Week 1): Pt will solve complex problems with minA verbal cues. SLP Short Term Goal 3 (Week 1): Pt will recall new information with minA verbal cues or visual aids. SLP Short Term Goal 4 (Week 1): Pt will tolerate least restrictive diet using safe swallow strategies with sup.  Refer to Care Plan for Long Term Goals  Recommendations for other services: None   Discharge Criteria: Patient will be discharged from SLP if patient refuses treatment 3 consecutive times without medical reason, if treatment goals not met, if there is a change in medical status, if patient makes no progress towards goals or if patient is  discharged from hospital.  The above assessment, treatment plan, treatment alternatives and goals were discussed and mutually agreed upon: by patientand husband.  Verdene Lennert 01/18/2023, 2:46 PM

## 2023-01-18 NOTE — Evaluation (Signed)
Occupational Therapy Assessment and Plan  Patient Details  Name: Patricia [name] MRN: [id number] Date of Birth: 03-Jan-1946  OT Diagnosis: apraxia, hemiplegia affecting dominant side, and muscle weakness (generalized) Rehab Potential: Rehab Potential (ACUTE ONLY): Good ELOS: 3-5 wks   Today's Date: 01/18/2023 OT Individual Time: 4332-9518 OT Individual Time Calculation (min): 86 min     Hospital Problem: Principal Problem:   Embolic stroke [facility])   Past Medical History:  Past Medical History:  Diagnosis Date   Depression    GERD (gastroesophageal reflux disease)    Hypercholesterolemia    IFG (impaired fasting glucose)    Osteopenia    PONV (postoperative nausea and vomiting)    Past Surgical History:  Past Surgical History:  Procedure Laterality Date   ABDOMINAL HYSTERECTOMY     CATARACT EXTRACTION W/PHACO  09/12/2011   Procedure: CATARACT EXTRACTION PHACO AND INTRAOCULAR LENS PLACEMENT ([place]);  Surgeon: Tonny Branch;  Location: [facility];  Service: Ophthalmology;  Laterality: Left;  CDE: 8.91   CATARACT EXTRACTION W/PHACO  11/18/2011   Procedure: CATARACT EXTRACTION PHACO AND INTRAOCULAR LENS PLACEMENT ([facility]);  Surgeon: Tonny Branch;  Location: [facility];  Service: Ophthalmology;  Laterality: Right;  CDE:10.26   COLONOSCOPY  2007   Dr. Gala Romney: internal hemorrhoids, single anal papilla poor prep.    COLONOSCOPY N/A 03/27/2017   Rourk: Normal exam.   ECTOPIC PREGNANCY SURGERY     removal of right tube   ESOPHAGOGASTRODUODENOSCOPY (EGD) WITH PROPOFOL N/A 09/10/2021   Procedure: ESOPHAGOGASTRODUODENOSCOPY (EGD) WITH PROPOFOL;  Surgeon: Daneil Dolin, MD;  Location: [facility];  Service: Endoscopy;  Laterality: N/A;  8:15AM   MALONEY DILATION N/A 09/10/2021   Procedure: Venia Minks DILATION;  Surgeon: Daneil Dolin, MD;  Location: [facility];  Service: Endoscopy;  Laterality: N/A;   OVARIAN CYST REMOVAL     right   RECTOCELE REPAIR N/A 01/18/2014   Procedure: POSTERIOR REPAIR  (RECTOCELE);  Surgeon: Jonnie Kind, MD;  Location: [facility];  Service: Gynecology;  Laterality: N/A;   VAGINAL HYSTERECTOMY N/A 01/18/2014   Procedure: HYSTERECTOMY VAGINAL;  Surgeon: Jonnie Kind, MD;  Location: [facility];  Service: Gynecology;  Laterality: N/A;    Assessment & Plan Clinical Impression: Patricia [name] is a 77 year old female with history of GERD, dysphagia s/p esophageal dilatation, L-VC paralysis, depression, Stage IV NSLC w/extensive lymphadenopathy diagnosed 12/23 and started on chemo last month. She was admitted on 01/10/23 with 2 week history of HA who developed acute onset of LLE weakness and vertigo with difficulty walking on day of admission. MRI brain done revealing small scattered acute and subacute infarcts in bilateral cerebral hemispheres and enhancing nodule right frontal cortex likley subacute infarct w/recommendations to repeat in 6-8 weeks to rule out malignancy. Neurology felt that stroke embolic v/s due to hypercoag state due to cancer. 2D echo showed EF 60-65% with grade IDD. The patient is said to continue to be limited by left sided weakness with left lean, decreased awareness of deficits, poor attention with verbosity and cognitive deficits affecting ADLs and mobility. CIR recommended due to functional decline.  The patient transferred to CIR on 01/17/2023 .    Patient currently requires  with basic self-care skills secondary to muscle weakness.  Prior to hospitalization, patient could complete BADL's  with independent .  Patient will benefit from skilled intervention to decrease level of assist with basic self-care skills prior to discharge home with care partner.  Anticipate patient will require intermittent supervision and follow up home  health.  OT - End of Session Activity Tolerance: Tolerates 30+ min activity with multiple rests Endurance Deficit: Yes Endurance Deficit Description: requires frequent seated rest breaks OT Assessment Rehab Potential  (ACUTE ONLY): Good OT Patient demonstrates impairments in the following area(s): Balance;Endurance;Vision;Motor OT Basic ADL's Functional Problem(s): Bathing;Dressing;Toileting OT Advanced ADL's Functional Problem(s): Simple Meal Preparation OT Transfers Functional Problem(s): Toilet;Tub/Shower OT Plan OT Frequency: 5 out of 7 days OT Duration/Estimated Length of Stay: 3-5 wks OT Treatment/Interventions: Balance/vestibular training;Neuromuscular re-education;Self Care/advanced ADL retraining;Therapeutic Exercise;UE/LE Strength taining/ROM;Therapeutic Activities OT Self Feeding Anticipated Outcome(s): Ind OT Basic Self-Care Anticipated Outcome(s): CGA to ModI OT Toileting Anticipated Outcome(s): CGA to ModI OT Bathroom Transfers Anticipated Outcome(s): CGA to [facility] OT Recommendation Patient destination: Home Follow Up Recommendations: None Equipment Details: to be determined   OT Evaluation Precautions/Restrictions  Precautions Precautions: Fall;Other (comment) Precaution Comments: L hemiparesis, L lean Restrictions Weight Bearing Restrictions: No General Chart Reviewed: Yes Family/Caregiver Present: Yes Vital Signs Therapy Vitals Temp: 97.9 F (36.6 C) Temp Source: Oral Pulse Rate: 74 Resp: 18 BP: 105/64 Patient Position (if appropriate): Lying Oxygen Therapy SpO2: 91 % O2 Device: Room Air Pain Pain Assessment Pain Scale: 0-10 Pain Score: 7  Pain Type: Acute pain Pain Location: Head Pain Descriptors / Indicators: Headache Pain Onset: Gradual Pain Intervention(s): RN made aware Home Living/Prior Cecilia expects to be discharged to:: Private residence Living Arrangements: Spouse/significant other Available Help at Discharge: Family, Available 24 hours/day Type of Home: House Home Access: Stairs to enter [employer] of Steps: 4 (back(main) entrance) Entrance Stairs-Rails: Right, Left, Can reach both Home Layout: One  level Bathroom Shower/Tub: Multimedia programmer: Standard Bathroom Accessibility: Yes  Lives With: Spouse [name]) IADL History Current License: Yes Education: GED and 3 years of college Occupation: Part time employment, Full time employment Prior Function Level of Independence: Independent with gait, Independent with transfers, Independent with homemaking with ambulation  Able to Take Stairs?: Yes Driving: Yes Vocation: Full time employment Vocation Requirements: Sample (Submits) clerk Vision Baseline Vision/History:  (hx of LASIK surgery) Ability to See in Adequate Light: 0 Adequate Patient Visual Report: Diplopia (intermittent double vision) Vision Assessment?: Vision impaired- to be further tested in functional context;Yes Eye Alignment: Within Functional Limits Ocular Range of Motion: Within Functional Limits (pt frequently closing R eye during smooth pursuit) Tracking/Visual Pursuits: Other (comment) (able to track in all quads but frequently closing R eye) Saccades: Decreased speed of saccadic movement;Impaired - to be further tested in functional context;Additional eye shifts occurred during testing Diplopia Assessment: Other (comment) (pt reports it is only present when looking at TV) Perception  Perception: Impaired Body Part Identification: impaired identification of L LE Spatial Orientation: impaired midline orientation with L lean bias Praxis Praxis: Impaired Praxis Impairment Details: Motor planning;Initiation Cognition Cognition Overall Cognitive Status: Impaired/Different from baseline Arousal/Alertness: Awake/alert Memory: Impaired Memory Impairment: Decreased recall of new information;Decreased short term memory Decreased Short Term Memory: Verbal basic Attention: Focused;Sustained;Selective Focused Attention: Appears intact Sustained Attention: Impaired Sustained Attention Impairment: Verbal basic Selective Attention: Impaired Awareness:  Impaired Awareness Impairment: Emergent impairment Problem Solving: Appears intact Problem Solving Impairment: Verbal complex;Functional complex Executive Function: Reasoning;Sequencing;Organizing;Self Correcting Reasoning: Impaired Reasoning Impairment: Verbal complex Sequencing: Impaired Sequencing Impairment: Verbal complex Organizing: Impaired Organizing Impairment: Verbal basic Self Monitoring: Impaired Self Monitoring Impairment: Verbal basic;Functional basic Self Correcting: Impaired Self Correcting Impairment: Verbal basic Safety/Judgment: Appears intact [employer] Scales of Cognitive Functioning: Purposeful, Appropriate Brief Interview for Mental Status (BIMS) Repetition of Three Words (First  Attempt): 3 Temporal Orientation: Year: Correct Temporal Orientation: Month: Accurate within 5 days Temporal Orientation: Day: Correct Recall: "Sock": Yes, after cueing ("something to wear") Recall: "Blue": Yes, after cueing ("a color") Recall: "Bed": Yes, no cue required BIMS Summary Score: 13 Sensation Sensation Light Touch: Appears Intact Hot/Cold: Not tested Proprioception: Impaired Detail Proprioception Impaired Details: Impaired LLE Stereognosis: Not tested Coordination Gross Motor Movements are Fluid and Coordinated: No Fine Motor Movements are Fluid and Coordinated: Yes (patient able to demonstrate movement, however, she presents with weakness impacting the quality of her movement over time.) Coordination and Movement Description: GM movements impaired due to L hemiparesis and impaired midline orientation Motor  Motor Motor: Hemiplegia;Abnormal postural alignment and control Motor - Skilled Clinical Observations: L hemiparesis, L lateral trunk lean  Trunk/Postural Assessment  Cervical Assessment Cervical Assessment: Within Functional Limits Thoracic Assessment Thoracic Assessment: Exceptions to [facility] Lumbar Assessment Lumbar Assessment: Exceptions to  [facility] Postural Control Postural Control: Deficits on evaluation (Patient presents with L Lean increasing risk for fall) Trunk Control: tendency for L lateral trunk LOB sitting EOB Protective Responses: delayed and insufficient with L or posterior LOB bias Postural Limitations: decreased  Balance Balance Balance Assessed: No Static Sitting Balance Static Sitting - Balance Support: Feet supported Static Sitting - Level of Assistance: Other (comment) (patient presents with Left Lean more exagerated over time.) Dynamic Sitting Balance Dynamic Sitting - Balance Support: Feet supported Dynamic Sitting - Level of Assistance: 4: Min assist;3: Mod assist Sitting balance - Comments: left lean Static Standing Balance Static Standing - Balance Support: During functional activity Static Standing - Level of Assistance: 4: Min assist;3: Mod assist Dynamic Standing Balance Dynamic Standing - Balance Support: During functional activity Dynamic Standing - Level of Assistance: 3: Mod assist;2: Max assist Extremity/Trunk Assessment RUE Assessment RUE Assessment: Within Functional Limits Passive Range of Motion (PROM) Comments:  (full range) Active Range of Motion (AROM) Comments:  (full range) General Strength Comments: 3/5 LUE Assessment LUE Assessment: Within Functional Limits Passive Range of Motion (PROM) Comments: full range Active Range of Motion (AROM) Comments: Full range General Strength Comments: 3-/5  Care Tool Care Tool Self Care Eating Eating activity did not occur: Refused (patient is s/u assist based on observation) Eating Assist Level: Set up assist    Oral Care    Oral Care Assist Level: Set up assist    Bathing   Body parts bathed by patient: Right arm;Left arm;Chest;Abdomen;Front perineal area;Buttocks;Right upper leg;Left upper leg;Face Body parts bathed by helper: Left lower leg;Right lower leg   Assist Level: Minimal Assistance - Patient > 75%    Upper Body  Dressing(including orthotics)   What is the patient wearing?: Pull over shirt   Assist Level: Set up assist    Lower Body Dressing (excluding footwear)   What is the patient wearing?: Pants;Incontinence brief Assist for lower body dressing: Maximal Assistance - Patient 25 - 49%    Putting on/Taking off footwear   What is the patient wearing?: Non-skid slipper socks;Shoes Assist for footwear: Maximal Assistance - Patient 25 - 49%       Care Tool Toileting Toileting activity   Assist for toileting: Minimal Assistance - Patient > 75%     Care Tool Bed Mobility Roll left and right activity   Roll left and right assist level: Minimal Assistance - Patient > 75%    Sit to lying activity   Sit to lying assist level: Moderate Assistance - Patient 50 - 74%    Lying to sitting on  side of bed activity   Lying to sitting on side of bed assist level: the ability to move from lying on the back to sitting on the side of the bed with no back support.: Moderate Assistance - Patient 50 - 74%     Care Tool Transfers Sit to stand transfer   Sit to stand assist level: Moderate Assistance - Patient 50 - 74%    Chair/bed transfer   Chair/bed transfer assist level: Moderate Assistance - Patient 50 - 74%     Toilet transfer         Care Tool Cognition  Expression of Ideas and Wants Expression of Ideas and Wants: 4. Without difficulty (complex and basic) - expresses complex messages without difficulty and with speech that is clear and easy to understand  Understanding Verbal and Non-Verbal Content Understanding Verbal and Non-Verbal Content: 3. Usually understands - understands most conversations, but misses some part/intent of message. Requires cues at times to understand   Memory/Recall Ability Memory/Recall Ability : Current season;That he or she is in a hospital/hospital unit   Refer to Care Plan for [facility] OT Short Term Goal 1 (Week 1): The pt will  transfer to all surfaces with MinA at 95% safe OT Short Term Goal 2 (Week 1): The pt will complete LB dressing with MinA at 95% safe OT Short Term Goal 3 (Week 1): The pt will tolerate >45 minutes of activity with minimal rest breaks @ 95% safe OT Short Term Goal 4 (Week 1): The pt will complete toileting with MinA by incorporating the grab bars  @ 95% safe  Recommendations for other services: None    Skilled Therapeutic Intervention  Patient seen this date for OT evaluation with family present at the time of treatment. The pt was  in good spirit, however, she indicated that she was experiencing dizziness and had reported to nursing that she had a head ache.  The pt was able to complete a BADL related task in bathing at sink level, following toileting.  The pt presents with a strong left lean, which is exaggerated over time, impacting her functional balance. The pt is able to complete BADL related task for UB with s/u assist , however, secondary to challenges with anatomical positioning, she is Mod to [place] with LB task performance resulting in assistance from others.  The pt is motivated towards improving her outcome and would benefit from skilled instructions  in functional tranfers, LB BADL related task performance, in bathing and dressing,  activity tolerance and UB strengthening ,to reduce the burden of care for the care provider and to ensure a smooth transition to home. .  I recommend that the pt participate in skilled OT service for 3-5 weeks for goal attainment.  ADL ADL Equipment Provided: Reacher Eating: Not assessed (based on observation pt is s/u assist does present with swallowing challenges encouraged to tuck chin to reduce incidence for adverse response.) Where Assessed-Eating: Wheelchair Grooming: Setup Where Assessed-Grooming: Sitting at sink Upper Body Bathing: Setup Where Assessed-Upper Body Bathing: Sitting at sink Lower Body Bathing: Minimal assistance;Moderate  assistance Where Assessed-Lower Body Bathing: Sitting at sink Upper Body Dressing: Setup Where Assessed-Upper Body Dressing: Wheelchair Lower Body Dressing: Minimal assistance;Moderate assistance Where Assessed-Lower Body Dressing: Wheelchair Toileting: Modified independent;Contact guard (secondary to challenges with transfer from w/c to commode needing additional time) Where Assessed-Toileting: Glass blower/designer: Moderate assistance;Minimal assistance (using grab bar and additional time.) Toilet Transfer Method: Stand pivot Toilet  Transfer Equipment: Grab bars (grab bar and arm of w/c) Tub/Shower Transfer: Minimal assistance;Moderate assistance (based on observation of toilet transfer.) Social research officer, government: Not assessed Mobility  Bed Mobility Bed Mobility: Supine to Sit;Sit to Supine Supine to Sit: Moderate Assistance - Patient 50-74%;Minimal Assistance - Patient > 75% Sit to Supine: Moderate Assistance - Patient 50-74% Transfers Sit to Stand: Moderate Assistance - Patient 50-74% Stand to Sit: Moderate Assistance - Patient 50-74%   Discharge Criteria: Patient will be discharged from OT if patient refuses treatment 3 consecutive times without medical reason, if treatment goals not met, if there is a change in medical status, if patient makes no progress towards goals or if patient is discharged from hospital.  The above assessment, treatment plan, treatment alternatives and goals were discussed and mutually agreed upon: by patient  Yvonne Kendall 01/18/2023, 4:45 PM

## 2023-01-18 NOTE — Evaluation (Signed)
Physical Therapy Assessment and Plan  Patient Details  Name: Patricia [name] MRN: [id number] Date of Birth: Nov 26, 1946  PT Diagnosis: Abnormal posture, Abnormality of gait, Cognitive deficits, Difficulty walking, Hemiparesis non-dominant, Impaired sensation, Muscle weakness, and Pain in Headache Rehab Potential: Good ELOS: 3-3.5 weeks   Today's Date: 01/18/2023 PT Individual Time: 9476-5465 PT Individual Time Calculation (min): 63 min    Hospital Problem: Principal Problem:   Embolic stroke [facility])   Past Medical History:  Past Medical History:  Diagnosis Date   Depression    GERD (gastroesophageal reflux disease)    Hypercholesterolemia    IFG (impaired fasting glucose)    Osteopenia    PONV (postoperative nausea and vomiting)    Past Surgical History:  Past Surgical History:  Procedure Laterality Date   ABDOMINAL HYSTERECTOMY     CATARACT EXTRACTION W/PHACO  09/12/2011   Procedure: CATARACT EXTRACTION PHACO AND INTRAOCULAR LENS PLACEMENT ([place]);  Surgeon: Tonny Branch;  Location: [facility];  Service: Ophthalmology;  Laterality: Left;  CDE: 8.91   CATARACT EXTRACTION W/PHACO  11/18/2011   Procedure: CATARACT EXTRACTION PHACO AND INTRAOCULAR LENS PLACEMENT ([facility]);  Surgeon: Tonny Branch;  Location: [facility];  Service: Ophthalmology;  Laterality: Right;  CDE:10.26   COLONOSCOPY  2007   Dr. Gala Romney: internal hemorrhoids, single anal papilla poor prep.    COLONOSCOPY N/A 03/27/2017   Rourk: Normal exam.   ECTOPIC PREGNANCY SURGERY     removal of right tube   ESOPHAGOGASTRODUODENOSCOPY (EGD) WITH PROPOFOL N/A 09/10/2021   Procedure: ESOPHAGOGASTRODUODENOSCOPY (EGD) WITH PROPOFOL;  Surgeon: Daneil Dolin, MD;  Location: [facility];  Service: Endoscopy;  Laterality: N/A;  8:15AM   MALONEY DILATION N/A 09/10/2021   Procedure: Venia Minks DILATION;  Surgeon: Daneil Dolin, MD;  Location: [facility];  Service: Endoscopy;  Laterality: N/A;   OVARIAN CYST REMOVAL     right   RECTOCELE REPAIR  N/A 01/18/2014   Procedure: POSTERIOR REPAIR (RECTOCELE);  Surgeon: Jonnie Kind, MD;  Location: [facility];  Service: Gynecology;  Laterality: N/A;   VAGINAL HYSTERECTOMY N/A 01/18/2014   Procedure: HYSTERECTOMY VAGINAL;  Surgeon: Jonnie Kind, MD;  Location: [facility];  Service: Gynecology;  Laterality: N/A;    Assessment & Plan Clinical Impression: Patient is a 77 y.o. year old female with history of GERD, dysphagia s/p esophageal dilatation, L-VC paralysis, depression, Stage IV NSLC w/extensive lymphadenopathy diagnosed 12/23 and started on chemo last month. She was admitted on 01/10/23 with 2 week history of HA who developed acute onset of LLE weakness and vertigo with difficulty walking on day of admission. MRI brain done revealing small scattered acute and subacute infarcts in bilateral cerebral hemispheres and enhancing nodule right frontal cortex likley subacute infarct w/recommendations to repeat in 6-8 weeks to rule out malignancy. Neurology felt that stroke embolic v/s due to hypercoag state due to cancer. 2D echo showed EF 60-65% with grade IDD.    She was started on DAPT with question of DOAC was discussed with Dr. Earlie Server. She was started on Eliquis 02/06 with recs to repeat MRI in 6-8 weeks. MBS done 02/05 revealing normal swallow function and D3, thins recommended with limited distractions. She continues to be limited by left sided weakness with left lean, decreased awareness of deficits, poor attention with verbosity and cognitive deficits affecting ADLs and mobility. CIR recommended due to functional decline. Patient transferred to CIR on 01/17/2023 .   Patient currently requires +2 mod assist with mobility secondary to muscle weakness and muscle joint  tightness, decreased cardiorespiratoy endurance, impaired timing and sequencing, unbalanced muscle activation, and decreased motor planning, decreased visual motor skills, decreased midline orientation, decreased attention, decreased  awareness, decreased memory, and delayed processing,  , and decreased sitting balance, decreased standing balance, decreased postural control, hemiplegia, and decreased balance strategies.  Prior to hospitalization, patient was independent  with mobility and lived with Spouse Patricia [name]) in a House home.  Home access is 4 (back(main) entrance)Stairs to enter.  Patient will benefit from skilled PT intervention to maximize safe functional mobility, minimize fall risk, and decrease caregiver burden for planned discharge home with 24 hour assist.  Anticipate patient will benefit from follow up OP at discharge.  PT - End of Session Activity Tolerance: Tolerates 30+ min activity with multiple rests Endurance Deficit: Yes Endurance Deficit Description: requires frequent seated rest breaks PT Assessment Rehab Potential (ACUTE/IP ONLY): Good PT Barriers to Discharge: [place] home environment;Home environment access/layout;Pending chemo/radiation PT Patient demonstrates impairments in the following area(s): Balance;Perception;Behavior;Safety;Edema;Sensory;Endurance;Skin Integrity;Motor;Nutrition;Pain PT Transfers Functional Problem(s): Bed Mobility;Bed to Chair;Car;Furniture PT Locomotion Functional Problem(s): Ambulation;Stairs;Wheelchair Mobility PT Plan PT Intensity: Minimum of 1-2 x/day ,45 to 90 minutes PT Frequency: 5 out of 7 days PT Duration Estimated Length of Stay: 3-3.5 weeks PT Treatment/Interventions: Ambulation/gait training;Community reintegration;DME/adaptive equipment instruction;Neuromuscular re-education;Psychosocial support;Stair training;UE/LE Strength taining/ROM;Wheelchair propulsion/positioning;Balance/vestibular training;Discharge planning;Functional electrical stimulation;Pain management;Skin care/wound management;Therapeutic Activities;UE/LE Coordination activities;Cognitive remediation/compensation;Disease management/prevention;Functional mobility training;Patient/family  education;Splinting/orthotics;Therapeutic Exercise;Visual/perceptual remediation/compensation PT Transfers Anticipated Outcome(s): CGA using LRAD PT Locomotion Anticipated Outcome(s): CGA using LRAD PT Recommendation Recommendations for Other Services: Vestibular eval;Therapeutic Recreation consult Therapeutic Recreation Interventions: Stress management;Outing/community reintergration Follow Up Recommendations: 24 hour supervision/assistance;Outpatient PT Patient destination: Home Equipment Recommended: To be determined   PT Evaluation Precautions/Restrictions Precautions Precautions: Fall;Other (comment) Precaution Comments: L hemiparesis, L lean Restrictions Weight Bearing Restrictions: No Pain Pain Assessment Pain Scale: 0-10 Pain Score: 9  Pain Type: Acute pain Pain Location: Head Pain Orientation: Anterior Pain Descriptors / Indicators: Headache Pain Onset: Gradual Pain Interference Pain Interference Pain Effect on Sleep: 4. Almost constantly Pain Interference with Therapy Activities: 2. Occasionally Pain Interference with Day-to-Day Activities: 3. Frequently Home Living/Prior Functioning Home Living Available Help at Discharge: Family;Available 24 hours/day Type of Home: House Home Access: Stairs to enter [employer] of Steps: 4 (back(main) entrance) Entrance Stairs-Rails: Right;Left;Can reach both Home Layout: One level Bathroom Shower/Tub: Multimedia programmer: Standard Bathroom Accessibility: Yes  Lives With: Spouse Patricia [name]) Prior Function Level of Independence: Independent with gait;Independent with transfers;Independent with homemaking with ambulation  Able to Take Stairs?: Yes Driving: Yes Vocation: Full time employment Vocation Requirements: Sample (Submits) clerk Vision/Perception  Vision - History Ability to See in Adequate Light: 0 Adequate Vision - Assessment Eye Alignment: Within Functional Limits Ocular Range of Motion:  Within Functional Limits (pt frequently closing R eye during smooth pursuit) Tracking/Visual Pursuits: Other (comment) (able to track in all quads but frequently closing R eye) Saccades: Decreased speed of saccadic movement;Impaired - to be further tested in functional context;Additional eye shifts occurred during testing Diplopia Assessment: Other (comment) (pt reports it is only present when looking at TV) Perception Perception: Impaired Body Part Identification: impaired identification of L LE Spatial Orientation: impaired midline orientation with L lean bias Praxis Praxis: Impaired Praxis Impairment Details: Motor planning;Initiation  Cognition Overall Cognitive Status: Impaired/Different from baseline Arousal/Alertness: Awake/alert Orientation Level: Oriented X4 Year: 2024 Month: February Day of Week: Correct Attention: Focused;Sustained;Selective Focused Attention: Appears intact Sustained Attention: Impaired Sustained Attention Impairment: Verbal basic Selective Attention: Impaired Memory: Impaired Decreased Short Term Memory:  Verbal basic Awareness: Impaired Safety/Judgment: Appears intact Sensation Sensation Light Touch: Appears Intact Hot/Cold: Not tested Proprioception: Impaired Detail Proprioception Impaired Details: Impaired LLE Stereognosis: Not tested Coordination Gross Motor Movements are Fluid and Coordinated: No Fine Motor Movements are Fluid and Coordinated: Yes Coordination and Movement Description: GM movements impaired due to L hemiparesis and impaired midline orientation Motor  Motor Motor: Hemiplegia;Abnormal postural alignment and control Motor - Skilled Clinical Observations: L hemiparesis, L lateral trunk lean   Trunk/Postural Assessment  Cervical Assessment Cervical Assessment: Within Functional Limits Thoracic Assessment Thoracic Assessment: Exceptions to [facility] (rounded shoulders) Lumbar Assessment Lumbar Assessment: Exceptions to [facility]  (posterior pelvic tilt) Postural Control Postural Control: Deficits on evaluation Trunk Control: tendency for L lateral trunk LOB sitting EOB Protective Responses: delayed and insufficient with L or posterior LOB bias Postural Limitations: decreased  Balance Balance Balance Assessed: Yes Static Sitting Balance Static Sitting - Balance Support: Feet supported Static Sitting - Level of Assistance: Other (comment) (CGA) Dynamic Sitting Balance Dynamic Sitting - Balance Support: Feet supported Dynamic Sitting - Level of Assistance: 4: Min assist;3: Mod assist Static Standing Balance Static Standing - Balance Support: During functional activity Static Standing - Level of Assistance: 4: Min assist;3: Mod assist Dynamic Standing Balance Dynamic Standing - Balance Support: During functional activity Dynamic Standing - Level of Assistance: 3: Mod assist;2: Max assist Extremity Assessment      RLE Assessment RLE Assessment: Exceptions to [facility] RLE Strength RLE Overall Strength Comments: assessed in sitting Right Hip Flexion: 4-/5 Right Knee Flexion: 4/5 Right Knee Extension: 4+/5 Right Ankle Dorsiflexion: 4+/5 Right Ankle Plantar Flexion: 4/5 LLE Assessment LLE Assessment: Exceptions to [facility] Active Range of Motion (AROM) Comments: limited ankle DF ROM partially limited by pain LLE Strength LLE Overall Strength Comments: assessed in sitting Left Hip Flexion: 3+/5 Left Knee Flexion: 4-/5 Left Knee Extension: 4/5 Left Ankle Dorsiflexion: 2+/5 (reports calf pain limiting this) Left Ankle Plantar Flexion: 2/5 (reports calf pain limiting this)  Care Tool Care Tool Bed Mobility Roll left and right activity   Roll left and right assist level: Minimal Assistance - Patient > 75%    Sit to lying activity   Sit to lying assist level: Moderate Assistance - Patient 50 - 74%    Lying to sitting on side of bed activity   Lying to sitting on side of bed assist level: the ability to move from  lying on the back to sitting on the side of the bed with no back support.: Moderate Assistance - Patient 50 - 74%     Care Tool Transfers Sit to stand transfer   Sit to stand assist level: Moderate Assistance - Patient 50 - 74%    Chair/bed transfer   Chair/bed transfer assist level: Moderate Assistance - Patient 50 - 74%     Psychologist, counselling transfer activity did not occur: Safety/medical concerns (fatigue limited)        Care Tool Locomotion Ambulation   Assist level: 2 helpers Assistive device: Hand held assist Max distance: 46ft  Walk 10 feet activity   Assist level: 2 helpers Assistive device: Hand held assist   Walk 50 feet with 2 turns activity Walk 50 feet with 2 turns activity did not occur: Safety/medical concerns      Walk 150 feet activity Walk 150 feet activity did not occur: Safety/medical concerns      Walk 10 feet on uneven surfaces activity Walk 10 feet on uneven  surfaces activity did not occur: Safety/medical concerns      Stairs Stair activity did not occur: Safety/medical concerns        Walk up/down 1 step activity Walk up/down 1 step or curb (drop down) activity did not occur: Safety/medical concerns      Walk up/down 4 steps activity Walk up/down 4 steps activity did not occur: Safety/medical concerns      Walk up/down 12 steps activity Walk up/down 12 steps activity did not occur: Safety/medical concerns      Pick up small objects from floor   Pick up small object from the floor assist level: Dependent - Patient 0%    Wheelchair   Type of Wheelchair: Manual   Wheelchair assist level: Dependent - Patient 0%    Wheel 50 feet with 2 turns activity   Assist Level: Dependent - Patient 0%  Wheel 150 feet activity   Assist Level: Dependent - Patient 0%    Refer to Care Plan for Long Term Goals  SHORT TERM GOAL WEEK 1 PT Short Term Goal 1 (Week 1): Pt will perform supine<>sit with min assist PT Short Term Goal 2  (Week 1): Pt will perform sit<>stands using LRAD with min assist PT Short Term Goal 3 (Week 1): Pt will perform bed<>chair transfers using LRAD with min assist PT Short Term Goal 4 (Week 1): Pt will ambulate at least 67ft using LRAD with mod assist of 1 (+2 w/c follow if needed) PT Short Term Goal 5 (Week 1): Pt will initiate stair navigation training  Recommendations for other services: Therapeutic Recreation  Stress management and Outing/community reintegration  Skilled Therapeutic Intervention Therapist returned to perform evaluation and pt reporting feeling better at this time and is agreeable to session. Pt's husband, Patricia [name], present. Evaluation completed (see details above) with patient education regarding purpose of PT evaluation, PT POC and goals, therapy schedule, weekly team meetings, and other CIR information including safety plan and fall risk safety. Pt performed the below functional mobility tasks with the specified levels of skilled cuing and assistance. Throughout session pt demos impaired midline orientation with posterior vs L lateral LOB  as well as impaired proprioceptive/attention to L LE placement during transfers and gait with poor foot clearance despite attempts at cuing/facilitating improvement. At end of session, pt left supine in bed with needs in reach, bed alarm on, and her husband present.  Mobility Bed Mobility Bed Mobility: Supine to Sit;Sit to Supine Supine to Sit: Moderate Assistance - Patient 50-74%;Minimal Assistance - Patient > 75% Sit to Supine: Moderate Assistance - Patient 50-74% Transfers Transfers: Sit to Stand;Stand to Sit;Stand Pivot Transfers Sit to Stand: Moderate Assistance - Patient 50-74% Stand to Sit: Moderate Assistance - Patient 50-74% Stand Pivot Transfers: Moderate Assistance - Patient 50 - 74% Stand Pivot Transfer Details: Verbal cues for technique;Verbal cues for sequencing;Verbal cues for gait pattern;Verbal cues for safe use of  DME/AE;Verbal cues for precautions/safety;Visual cues/gestures for sequencing;Tactile cues for weight shifting;Tactile cues for sequencing;Manual facilitation for weight shifting Stand Pivot Transfer Details (indicate cue type and reason): pt with L lateral lean and lack of L LE step requiring maual facilitation to improve Transfer (Assistive device): 1 person hand held assist Locomotion  Gait Ambulation: Yes Gait Assistance: 2 Helpers;Moderate Assistance - Patient 50-74% Gait Distance (Feet): 34 Feet Assistive device: 2 person hand held assist Gait Assistance Details: Manual facilitation for weight shifting;Verbal cues for precautions/safety;Verbal cues for technique;Verbal cues for sequencing;Verbal cues for gait pattern;Visual cues/gestures for sequencing;Tactile cues for weight  shifting;Tactile cues for sequencing;Tactile cues for posture;Tactile cues for initiation Gait Gait: Yes Gait Pattern: Impaired Gait Pattern: Step-to pattern;Decreased step length - left;Poor foot clearance - left;Narrow base of support;Lateral trunk lean to left;Decreased stance time - left;Decreased weight shift to right Gait velocity: decreased Stairs / Additional Locomotion Stairs: No Wheelchair Mobility Wheelchair Mobility: No   Discharge Criteria: Patient will be discharged from PT if patient refuses treatment 3 consecutive times without medical reason, if treatment goals not met, if there is a change in medical status, if patient makes no progress towards goals or if patient is discharged from hospital.  The above assessment, treatment plan, treatment alternatives and goals were discussed and mutually agreed upon: by patient and by family  Tawana Scale , PT, DPT, NCS, CSRS 01/18/2023, 7:59 AM

## 2023-01-18 NOTE — Plan of Care (Signed)
  Problem: RH Balance Goal: LTG Patient will maintain dynamic sitting balance (PT) Description: LTG:  Patient will maintain dynamic sitting balance with assistance during mobility activities (PT) Flowsheets (Taken 01/18/2023 1555) LTG: Pt will maintain dynamic sitting balance during mobility activities with:: Supervision/Verbal cueing Goal: LTG Patient will maintain dynamic standing balance (PT) Description: LTG:  Patient will maintain dynamic standing balance with assistance during mobility activities (PT) Flowsheets (Taken 01/18/2023 1555) LTG: Pt will maintain dynamic standing balance during mobility activities with:: Contact Guard/Touching assist   Problem: Sit to Stand Goal: LTG:  Patient will perform sit to stand with assistance level (PT) Description: LTG:  Patient will perform sit to stand with assistance level (PT) Flowsheets (Taken 01/18/2023 1555) LTG: PT will perform sit to stand in preparation for functional mobility with assistance level: Supervision/Verbal cueing   Problem: RH Bed Mobility Goal: LTG Patient will perform bed mobility with assist (PT) Description: LTG: Patient will perform bed mobility with assistance, with/without cues (PT). Flowsheets (Taken 01/18/2023 1555) LTG: Pt will perform bed mobility with assistance level of: Supervision/Verbal cueing   Problem: RH Bed to Chair Transfers Goal: LTG Patient will perform bed/chair transfers w/assist (PT) Description: LTG: Patient will perform bed to chair transfers with assistance (PT). Flowsheets (Taken 01/18/2023 1555) LTG: Pt will perform Bed to Chair Transfers with assistance level: Contact Guard/Touching assist   Problem: RH Car Transfers Goal: LTG Patient will perform car transfers with assist (PT) Description: LTG: Patient will perform car transfers with assistance (PT). Flowsheets (Taken 01/18/2023 1555) LTG: Pt will perform car transfers with assist:: Contact Guard/Touching assist   Problem: RH  Ambulation Goal: LTG Patient will ambulate in controlled environment (PT) Description: LTG: Patient will ambulate in a controlled environment, # of feet with assistance (PT). Flowsheets (Taken 01/18/2023 1555) LTG: Ambulation distance in controlled environment: 122ft using LRAD Goal: LTG Patient will ambulate in home environment (PT) Description: LTG: Patient will ambulate in home environment, # of feet with assistance (PT). Flowsheets (Taken 01/18/2023 1555) LTG: Pt will ambulate in home environ  assist needed:: Contact Guard/Touching assist LTG: Ambulation distance in home environment: 74ft using LRAD   Problem: RH Stairs Goal: LTG Patient will ambulate up and down stairs w/assist (PT) Description: LTG: Patient will ambulate up and down # of stairs with assistance (PT) Flowsheets (Taken 01/18/2023 1555) LTG: Pt will ambulate up/down stairs assist needed:: Contact Guard/Touching assist LTG: Pt will  ambulate up and down number of stairs: 4 steps using HRs per home set-up

## 2023-01-19 DIAGNOSIS — I6340 Cerebral infarction due to embolism of unspecified cerebral artery: Secondary | ICD-10-CM | POA: Diagnosis not present

## 2023-01-19 DIAGNOSIS — M79662 Pain in left lower leg: Secondary | ICD-10-CM | POA: Diagnosis not present

## 2023-01-19 DIAGNOSIS — K5900 Constipation, unspecified: Secondary | ICD-10-CM

## 2023-01-19 MED ORDER — SENNA 8.6 MG PO TABS
1.0000 | ORAL_TABLET | Freq: Every day | ORAL | Status: DC
  Administered 2023-01-19 – 2023-01-22 (×4): 8.6 mg via ORAL
  Filled 2023-01-19 (×4): qty 1

## 2023-01-19 MED ORDER — DOCUSATE SODIUM 100 MG PO CAPS
100.0000 mg | ORAL_CAPSULE | Freq: Two times a day (BID) | ORAL | Status: DC
  Administered 2023-01-19 – 2023-01-22 (×6): 100 mg via ORAL
  Filled 2023-01-19 (×6): qty 1

## 2023-01-19 NOTE — Progress Notes (Signed)
Inpatient Rehabilitation  Patient information reviewed and entered into eRehab system by Britany Callicott Ryan Ogborn, OTR/L, Rehab Quality Coordinator.   Information including medical coding, functional ability and quality indicators will be reviewed and updated through discharge.   

## 2023-01-19 NOTE — Progress Notes (Signed)
PROGRESS NOTE   Subjective/Complaints:  Pt upset this morning because she wanted to get up to go to the restroom and she was told to use the urinal. She prefers to use the restroom. Discussed this with pt, and placed order for pt to be assisted to use restroom. Urinating well, though. Hasn't had a BM in 2 days, doesn't like miralax as this makes her nauseated/vomit.  Pain and headaches doing better.  Cough doing better, says it's mostly just when she swallows and it might tickle her throat and cause her to cough. Has used inhalers and does feel like it helps too.  Has some mild L calf pain with standing, no swelling, unchanged from yesterday.   ROS: +HA-improving, +dizziness-resolved, +LLE pain with standing, +constipation, +cough-better. Denies fevers, chills, CP, SOB, abd pain, N/V/D, new/worsening paresthesias/weakness, or any other complaints at this time.     Objective:   DG CHEST PORT 1 VIEW  Result Date: 01/17/2023 CLINICAL DATA:  Dyspnea, weakness EXAM: PORTABLE CHEST 1 VIEW COMPARISON:  03/26/2005 FINDINGS: No focal consolidation. No pleural effusion or pneumothorax. Heart and mediastinal contours are unremarkable. No acute osseous abnormality. IMPRESSION: No active disease. Electronically Signed   By: Kathreen Devoid M.D.   On: 01/17/2023 14:08   Recent Labs    01/18/23 0615  WBC 6.3  HGB 10.7*  HCT 31.8*  PLT 199    Recent Labs    01/18/23 0615  NA 137  K 4.0  CL 103  CO2 24  GLUCOSE 104*  BUN 11  CREATININE 0.84  CALCIUM 9.2     Intake/Output Summary (Last 24 hours) at 01/19/2023 8592 Last data filed at 01/18/2023 1618 Gross per 24 hour  Intake 320 ml  Output --  Net 320 ml         Physical Exam: Vital Signs Blood pressure 111/62, pulse 77, temperature 98.3 F (36.8 C), temperature source Oral, resp. rate 18, height 4' 9.99" (1.473 m), weight 64.5 kg, SpO2 96 %.   Constitutional: No apparent  distress but tearful initially. Appropriate appearance for age. Laying in bed HENT: No JVD. Atraumatic, normocephalic. Eyes: PERRLA. EOMI. Visual fields grossly intact. + right-beating nystagmus, worse when looking to right than left, with double vision when looking R Cardiovascular: RRR, no murmurs/rub/gallops. No Edema. Peripheral pulses 2+  Respiratory: CTAB. No rales; no rhonchi noted this morning, no wheezing appreciated. No distress or increased WOB.  Abdomen: + bowel sounds, normoactive. No distention or tenderness.  Skin: C/D/I. No apparent lesions. MSK/extremities: no pedal edema, negative Homan's sign on LLE, no appreciable reproducible tenderness to L calf, no swelling or discoloration.   Prior exam: MSK:      No apparent deformity.      Strength:                RUE: 5/5 SA, 5/5 EF, 5/5 EE, 5/5 WE, 5/5 FF, 5/5 FA                 LUE: 4/5 SA, 4/5 EF, 4/5 EE, 4/5 WE, 4/5 FF, 4/5 FA                 RLE: 5/5 HF, 5/5  KE, 5/5 DF, 5/5 EHL, 5/5 PF                 LLE:  4/5 HF, 4/5 KE, 4/5 DF, 4/5 EHL, 4/5 PF    Neurologic exam:  Cognition: AAO to person, place, time and event.  Language: Fluent, No substitutions or neoglisms. +Hoarse, breathy vocal quality. Names 3/3 objects correctly.  Memory: Recalls 2/3 objects at 5 minutes.  Insight: Good insight into current condition.  Mood: Pleasant affect, appropriate mood.  Sensation: To light touch intact in BL UEs and LEs  Reflexes: 2+ in BL UE and LEs. Negative Hoffman's and babinski signs bilaterally. 2-3 beats clonus with ankle jerk bilaterally.  CN: 2-12 grossly intact.  Coordination: No apparent tremors. No ataxia on FTN, HTS bilaterally.  Spasticity: MAS 0 in all extremities.   Assessment/Plan: 1. Functional deficits which require 3+ hours per day of interdisciplinary therapy in a comprehensive inpatient rehab setting. Physiatrist is providing close team supervision and 24 hour management of active medical problems listed  below. Physiatrist and rehab team continue to assess barriers to discharge/monitor patient progress toward functional and medical goals  Care Tool:  Bathing    Body parts bathed by patient: Right arm, Left arm, Chest, Abdomen, Front perineal area, Buttocks, Right upper leg, Left upper leg, Face   Body parts bathed by helper: Left lower leg, Right lower leg     Bathing assist Assist Level: Minimal Assistance - Patient > 75%     Upper Body Dressing/Undressing Upper body dressing   What is the patient wearing?: Pull over shirt    Upper body assist Assist Level: Set up assist    Lower Body Dressing/Undressing Lower body dressing      What is the patient wearing?: Pants, Incontinence brief     Lower body assist Assist for lower body dressing: Maximal Assistance - Patient 25 - 49%     Toileting Toileting    Toileting assist Assist for toileting: Minimal Assistance - Patient > 75%     Transfers Chair/bed transfer  Transfers assist     Chair/bed transfer assist level: Moderate Assistance - Patient 50 - 74%     Locomotion Ambulation   Ambulation assist      Assist level: 2 helpers Assistive device: Hand held assist Max distance: 52ft   Walk 10 feet activity   Assist     Assist level: 2 helpers Assistive device: Hand held assist   Walk 50 feet activity   Assist Walk 50 feet with 2 turns activity did not occur: Safety/medical concerns         Walk 150 feet activity   Assist Walk 150 feet activity did not occur: Safety/medical concerns         Walk 10 feet on uneven surface  activity   Assist Walk 10 feet on uneven surfaces activity did not occur: Safety/medical concerns         Wheelchair     Assist Is the patient using a wheelchair?: Yes Type of Wheelchair: Manual    Wheelchair assist level: Dependent - Patient 0%      Wheelchair 50 feet with 2 turns activity    Assist        Assist Level: Dependent - Patient  0%   Wheelchair 150 feet activity     Assist      Assist Level: Dependent - Patient 0%   Blood pressure 111/62, pulse 77, temperature 98.3 F (36.8 C), temperature source Oral, resp. rate 18, height  4' 9.99" (1.473 m), weight 64.5 kg, SpO2 96 %.  Medical Problem List and Plan: 1. 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 metastatic             -patient may shower             -ELOS/Goals: 12-14 days, SPV PT/OT/SLP goals  -Continue CIR 2. Antithrombotics: -DVT/anticoagulation:  Pharmaceutical: Eliquis 5 mg BID -antiplatelet therapy: N/A - was on ASA and Plavix prior to stroke, now on Eliquis only   3. LBP/Pain Management: Will continue tylenol 650mg  q6h PRN for now.  --May need something stronger for HA. Reports back pain better since chemo.   --Tylenol, flexeril 5mg  TID PRN, fioricet 2 tabs q6h PRN, Lidoderm patch QD   4. Mood/Behavior/Sleep: LCSW to follow for evaluation and support.                       --Melatonin 6mg  QHS PRN, Trazodone prn for insomnia.        -antipsychotic agents: N/A 5. Neuropsych/cognition: This patient is capable of making decisions on her own behalf. 6. Skin/Wound Care: Routine pressure relief measures.   7. Fluids/Electrolytes/Nutrition: Monitor I/O. Monitor weekly labs starting 01/20/23 -01/18/23 CMP unremarkable, monitor on weekly labs   8. HTN: Monitor BP TID--continue to hold Valsartan and HCTZ.              --SBP trending 100- 140 range. Avoid hypoperfusion   -01/19/23 BPs stable, cont to monitor Vitals:   01/17/23 1551 01/17/23 2027 01/18/23 0330 01/18/23 1243  BP: 110/66 130/71 123/72 (!) 121/52   01/18/23 1538 01/18/23 1950 01/19/23 0248  BP: 105/64 115/63 111/62      9. Stage IV  NSCLC w/lymphadenopathy/muscular nodules: Chemo to resume after discharge. --Per neurology, repeat MRI brain w/ and w/o in 6-8 weeks to continue to monitor for potential metastatic disease.   10. GERD s/p  esophageal dilatation: Continue Protonix 40mg  BID. Will add pepcid 20mg  BID also.  11. Anxiety/depression: On Paxil 40mg  daily w/xanax 0.5mg  QHS prn.   12. Hyperlipidemia: On Crestor 20mg  QD  13. Neck pain/Headaches: On PRN Fioricet 2 tabs Q6H, using consistently.  Will add Topamax 25 mg bid.              --lidocaine patch for local measures.              -- Flexeril 5 mg TID PRN for muscle tightness in neck -- bilateral occipital nerve blocks may be helpful if persistent, however in setting of metastic disease with multiple SQ nodules will hold off  -01/19/23 doing some better, cont regimen and monitor  14. Horseness/L-VC paralysis : Has been ongoing since August-->a little worse.               -- SLP for swallow, speech              -- ISB   15. Dizziness/nausea/double vision: R-beating nystagmus on exam. Vestibular eval and treat.  -01/18/23 was getting Zofran inpatient, ordered 4mg  ODT q8h PRN n/v; also has Compazine PRN  16. Persistent cough since CVA. Causing cervicalgia, HA's, and nausea/vomiting with PO intakes.              --Treat cough -rule out aspiration/post obstructive infection.                -- Duo nebs qid added w/tessalon perles prn --may need pulmonary involved if no better.  -01/18/23 CXR  yesterday without active disease; seems better today, though poor inspiratory effort. Continue Duonebs, Tessalon 200mg  TID PRN, Robitussin PRN, and monitor  -01/19/23 lungs sound ok today, coughing spells seems to be with PO intake, monitor for now but no acute change needed today  17. Anemia: Hgb down to 10.5 on 01/10/23, stable at 10.7 on 01/18/23; monitor on weekly labs starting 6/94/37  -Continue Folic acid 1mg  QD, multivitamins  18. Hx of rectal surgery, chronic constipation:  -01/18/23 LBM yesterday, but requests stool softeners given hx of rectal surgery. Ordered Miralax 17g QD + PRN, Colace 100mg  QD + PRN. Has Dulcolax supp PRN, Fleet's PRN.  -01/19/23 no BM in 2 days, doesn't  like miralax; change miralax to PRN only, increase Colace to 100mg  BID+PRN, add Senna 8.6mg  QD  19. LLE pain: nursing reporting this after assessment this morning 01/18/23, per PT reports-- PT concerned for DVT, no significant swelling noted on exam this morning -01/18/23 ordered doppler U/S bilateral LEs to r/o DVT given hx of malignancy, continue above pain meds -01/19/23 DVT study not done yet, will inquire about this. L leg without swelling or tenderness on exam, but still will proceed with dopplers.     LOS: 2 days A FACE TO [place] 01/19/2023, 7:27 AM

## 2023-01-20 ENCOUNTER — Inpatient Hospital Stay (HOSPITAL_COMMUNITY): Payer: Medicare HMO

## 2023-01-20 ENCOUNTER — Other Ambulatory Visit: Payer: Medicare HMO

## 2023-01-20 ENCOUNTER — Ambulatory Visit: Payer: Medicare HMO | Admitting: Internal Medicine

## 2023-01-20 ENCOUNTER — Ambulatory Visit: Payer: Medicare HMO

## 2023-01-20 DIAGNOSIS — R52 Pain, unspecified: Secondary | ICD-10-CM

## 2023-01-20 DIAGNOSIS — I63133 Cerebral infarction due to embolism of bilateral carotid arteries: Secondary | ICD-10-CM

## 2023-01-20 LAB — BASIC METABOLIC PANEL
Anion gap: 7 (ref 5–15)
BUN: 12 mg/dL (ref 8–23)
CO2: 23 mmol/L (ref 22–32)
Calcium: 9.1 mg/dL (ref 8.9–10.3)
Chloride: 106 mmol/L (ref 98–111)
Creatinine, Ser: 0.86 mg/dL (ref 0.44–1.00)
GFR, Estimated: >60 mL/min (ref >60)
Glucose, Bld: 111 mg/dL — ABNORMAL HIGH (ref 70–99)
Potassium: 3.4 mmol/L — ABNORMAL LOW (ref 3.5–5.1)
Sodium: 136 mmol/L (ref 135–145)

## 2023-01-20 LAB — CBC
HCT: 33.9 % — ABNORMAL LOW (ref 36.0–46.0)
Hemoglobin: 10.7 g/dL — ABNORMAL LOW (ref 12.0–15.0)
MCH: 28.8 pg (ref 26.0–34.0)
MCHC: 31.6 g/dL (ref 30.0–36.0)
MCV: 91.4 fL (ref 80.0–100.0)
Platelets: 275 10*3/uL (ref 150–400)
RBC: 3.71 MIL/uL — ABNORMAL LOW (ref 3.87–5.11)
RDW: 15.1 % (ref 11.5–15.5)
WBC: 4.9 10*3/uL (ref 4.0–10.5)
nRBC: 0 % (ref 0.0–0.2)

## 2023-01-20 MED ORDER — POTASSIUM CHLORIDE CRYS ER 20 MEQ PO TBCR
20.0000 meq | EXTENDED_RELEASE_TABLET | Freq: Two times a day (BID) | ORAL | Status: AC
  Administered 2023-01-20 – 2023-01-21 (×3): 20 meq via ORAL
  Filled 2023-01-20 (×3): qty 1

## 2023-01-20 MED ORDER — APIXABAN 5 MG PO TABS
10.0000 mg | ORAL_TABLET | Freq: Two times a day (BID) | ORAL | Status: AC
  Administered 2023-01-20 – 2023-01-27 (×14): 10 mg via ORAL
  Filled 2023-01-20 (×14): qty 2

## 2023-01-20 MED ORDER — POTASSIUM CHLORIDE CRYS ER 20 MEQ PO TBCR: 20.0000 meq | EXTENDED_RELEASE_TABLET | Freq: Two times a day (BID) | ORAL | Status: DC

## 2023-01-20 MED ORDER — SORBITOL 70 % SOLN
30.0000 mL | Freq: Once | Status: AC
  Administered 2023-01-20: 30 mL via ORAL
  Filled 2023-01-20: qty 30

## 2023-01-20 MED ORDER — BENZONATATE 100 MG PO CAPS
100.0000 mg | ORAL_CAPSULE | Freq: Three times a day (TID) | ORAL | Status: DC
  Administered 2023-01-21 – 2023-02-05 (×46): 100 mg via ORAL
  Filled 2023-01-20 (×46): qty 1

## 2023-01-20 MED ORDER — APIXABAN 5 MG PO TABS
5.0000 mg | ORAL_TABLET | Freq: Two times a day (BID) | ORAL | Status: DC
  Administered 2023-01-27 – 2023-02-05 (×18): 5 mg via ORAL
  Filled 2023-01-20 (×18): qty 1

## 2023-01-20 NOTE — Progress Notes (Signed)
Occupational Therapy Session Note  Patient Details  Name: Patricia [name] MRN: [id number] Date of Birth: 04/18/1946  Today's Date: 01/20/2023 OT Individual Time: 6644-0347 OT Individual Time Calculation (min): 58 min    Short Term Goals: Week 1:  OT Short Term Goal 1 (Week 1): The pt will transfer to all surfaces with MinA at 95% safe OT Short Term Goal 2 (Week 1): The pt will complete LB dressing with MinA at 95% safe OT Short Term Goal 3 (Week 1): The pt will tolerate >45 minutes of activity with minimal rest breaks @ 95% safe OT Short Term Goal 4 (Week 1): The pt will complete toileting with MinA by incorporating the grab bars  @ 95% safe  Skilled Therapeutic Interventions/Progress Updates:     Pt received sitting up in wc presenting to be in good spirits and receptive to skilled OT session. Pt reporting headache and cramp in LLE- Rn notified providing pain medications, OT offering rest breaks and repositioning. Pt also reporting nausea headache and constipation, however able to tolerate session without increased symptoms. MD in/out during session for morning rounds reporting plans to complete doppler soon.    Pt requesting assistance ordering lunch at beginning of session. Educated Pt on phone usage and items available from menu. Pt able to identify desired items form menu following education, however requiring assistance with ordering food d/t significantly soft speech with dietary staff unable to clearly hear Pt over the phone.   Pt completed functional mobility training in her room ambulating ~41ft x2 trials with mod verbal and tactile required to increase attention to LLE, step length, balance, and weight shifting.   Pt transported total A to therapy gym in wc for time management and energy conservation. Pt completed standing dynamic reaching and standing balance task for functional strengthening, coordination, and balance training. Pt required mod A to maintain dynamic standing  balance d/t L and posterior lean. Worked on weight shifting prior to beginning activity to support Pt in increasing body awareness to find center of balance. Pt unaware of L foot placement or small base of support with education and max verbal cues required. Pt able to reach across body to retrieve clothes pins placed on L side with RUE and don onto overhead basketball net with mod A for balance. Pt then completed task using L hand with rest break provided following. Pt reported mild dizziness in standing. Vitals obtained in sitting: BP 141/77 (95( Hr 74). Vitals in standing: BP 120/78 (92) Hr 85. Pt able to maintain standing with vitals re-taken after ~2 minutes: BP 134/82 (98) HR 90. Pt doffed clothes pins from overhead basketball net with mod A and max cueing for body positioning/wight shifting.   Pt transported back to her room total A in wc. Pt was left resting in wc with call bell in reach, seat belt alarm on, and all needs met.   Therapy Documentation Precautions:  Precautions Precautions: Fall, Other (comment) Precaution Comments: L hemiparesis, L lean Restrictions Weight Bearing Restrictions: No General:   Vital Signs: Therapy Vitals Temp: 98 F (36.7 C) Temp Source: Oral Pulse Rate: 74 Resp: 17 BP: 128/73 Patient Position (if appropriate): Lying Oxygen Therapy SpO2: 95 % O2 Device: Room Air Pain:   ADL: ADL Equipment Provided: Reacher Eating: Not assessed (based on observation pt is s/u assist does present with swallowing challenges encouraged to tuck chin to reduce incidence for adverse response.) Where Assessed-Eating: Wheelchair Grooming: Setup Where Assessed-Grooming: Sitting at sink Upper Body Bathing: Setup Where  Assessed-Upper Body Bathing: Sitting at sink Lower Body Bathing: Minimal assistance, Moderate assistance Where Assessed-Lower Body Bathing: Sitting at sink Upper Body Dressing: Setup Where Assessed-Upper Body Dressing: Wheelchair Lower Body Dressing:  Minimal assistance, Moderate assistance Where Assessed-Lower Body Dressing: Wheelchair Toileting: Modified independent, Contact guard (secondary to challenges with transfer from w/c to commode needing additional time) Where Assessed-Toileting: Glass blower/designer: Moderate assistance, Minimal assistance (using grab bar and additional time.) Toilet Transfer Method: Stand pivot Toilet Transfer Equipment: Grab bars (grab bar and arm of w/c) Tub/Shower Transfer: Minimal assistance, Moderate assistance (based on observation of toilet transfer.) Social research officer, government: Not assessed   Therapy/Group: Individual Therapy  [name] 01/20/2023, 8:00 AM

## 2023-01-20 NOTE — Progress Notes (Signed)
PROGRESS NOTE   Subjective/Complaints:  Still constipated , took a pill at home but does not recall name   ROS: +LLE pain with standing, +constipation, +cough-better. Denies fevers, chills, CP, SOB, abd pain, N/V/D,   Objective:   No results found. Recent Labs    01/18/23 0615 01/20/23 0838  WBC 6.3 4.9  HGB 10.7* 10.7*  HCT 31.8* 33.9*  PLT 199 275    Recent Labs    01/18/23 0615 01/20/23 0838  NA 137 136  K 4.0 3.4*  CL 103 106  CO2 24 23  GLUCOSE 104* 111*  BUN 11 12  CREATININE 0.84 0.86  CALCIUM 9.2 9.1     Intake/Output Summary (Last 24 hours) at 01/20/2023 0939 Last data filed at 01/20/2023 0700 Gross per 24 hour  Intake 570 ml  Output --  Net 570 ml         Physical Exam: Vital Signs Blood pressure 128/73, pulse 74, temperature 98 F (36.7 C), temperature source Oral, resp. rate 17, height 4' 9.99" (1.473 m), weight 64.5 kg, SpO2 95 %.   General: No acute distress Mood and affect are appropriate Heart: Regular rate and rhythm no rubs murmurs or extra sounds Lungs: Clear to auscultation, breathing unlabored, no rales or wheezes Abdomen: Positive bowel sounds, soft nontender to palpation, nondistended Extremities: No clubbing, cyanosis, or edema Skin: No evidence of breakdown, no evidence of rash  Prior exam: MSK:      No apparent deformity.      Strength:                RUE: 5/5 SA, 5/5 EF, 5/5 EE, 5/5 WE, 5/5 FF, 5/5 FA                 LUE: 4/5 SA, 4/5 EF, 4/5 EE, 4/5 WE, 4/5 FF, 4/5 FA                 RLE: 5/5 HF, 5/5 KE, 5/5 DF, 5/5 EHL, 5/5 PF                 LLE:  4/5 HF, 4/5 KE, 4/5 DF, 4/5 EHL, 4/5 PF    Neurologic exam:  Cognition: AAO to person, place, time and event.  Language: Fluent, No substitutions or neoglisms. +Hoarse, breathy vocal quality. Names 3/3 objects correctly.   Mood: Pleasant affect, appropriate mood.  Sensation: To light touch intact in BL UEs and LEs    Coordination: No apparent tremors. No ataxia on FTN, HTS bilaterally.  Spasticity: MAS 0 in all extremities.   Assessment/Plan: 1. Functional deficits which require 3+ hours per day of interdisciplinary therapy in a comprehensive inpatient rehab setting. Physiatrist is providing close team supervision and 24 hour management of active medical problems listed below. Physiatrist and rehab team continue to assess barriers to discharge/monitor patient progress toward functional and medical goals  Care Tool:  Bathing    Body parts bathed by patient: Right arm, Left arm, Chest, Abdomen, Front perineal area, Buttocks, Right upper leg, Left upper leg, Face   Body parts bathed by helper: Left lower leg, Right lower leg     Bathing assist Assist Level: Minimal Assistance -  Patient > 75%     Upper Body Dressing/Undressing Upper body dressing   What is the patient wearing?: Pull over shirt    Upper body assist Assist Level: Set up assist    Lower Body Dressing/Undressing Lower body dressing      What is the patient wearing?: Pants, Incontinence brief     Lower body assist Assist for lower body dressing: Maximal Assistance - Patient 25 - 49%     Toileting Toileting    Toileting assist Assist for toileting: Minimal Assistance - Patient > 75%     Transfers Chair/bed transfer  Transfers assist     Chair/bed transfer assist level: Moderate Assistance - Patient 50 - 74%     Locomotion Ambulation   Ambulation assist      Assist level: 2 helpers Assistive device: Hand held assist Max distance: 73ft   Walk 10 feet activity   Assist     Assist level: 2 helpers Assistive device: Hand held assist   Walk 50 feet activity   Assist Walk 50 feet with 2 turns activity did not occur: Safety/medical concerns         Walk 150 feet activity   Assist Walk 150 feet activity did not occur: Safety/medical concerns         Walk 10 feet on uneven surface   activity   Assist Walk 10 feet on uneven surfaces activity did not occur: Safety/medical concerns         Wheelchair     Assist Is the patient using a wheelchair?: Yes Type of Wheelchair: Manual    Wheelchair assist level: Dependent - Patient 0%      Wheelchair 50 feet with 2 turns activity    Assist        Assist Level: Dependent - Patient 0%   Wheelchair 150 feet activity     Assist      Assist Level: Dependent - Patient 0%   Blood pressure 128/73, pulse 74, temperature 98 F (36.7 C), temperature source Oral, resp. rate 17, height 4' 9.99" (1.473 m), weight 64.5 kg, SpO2 95 %.  Medical Problem List and Plan: 1. 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 metastatic             -patient may shower             -ELOS/Goals: 12-14 days, SPV PT/OT/SLP goals  -Continue CIR 2. Antithrombotics: -DVT/anticoagulation:  Pharmaceutical: Eliquis 5 mg BID -antiplatelet therapy: N/A - was on ASA and Plavix prior to stroke, now on Eliquis only   3. LBP/Pain Management: Will continue tylenol 650mg  q6h PRN for now.  --May need something stronger for HA. Reports back pain better since chemo.   --Tylenol, flexeril 5mg  TID PRN, fioricet 2 tabs q6h PRN, Lidoderm patch QD   4. Mood/Behavior/Sleep: LCSW to follow for evaluation and support.                       --Melatonin 6mg  QHS PRN, Trazodone prn for insomnia.        -antipsychotic agents: N/A 5. Neuropsych/cognition: This patient is capable of making decisions on her own behalf. 6. Skin/Wound Care: Routine pressure relief measures.   7. Fluids/Electrolytes/Nutrition: Monitor I/O. Monitor weekly labs starting 01/20/23 -01/18/23 CMP unremarkable, monitor on weekly labs   8. HTN: Monitor BP TID--continue to hold Valsartan and HCTZ.              --SBP trending 100-  140 range. Avoid hypoperfusion   -01/19/23 BPs stable, cont to monitor Vitals:   01/17/23 1551 01/17/23  2027 01/18/23 0330 01/18/23 1243  BP: 110/66 130/71 123/72 (!) 121/52   01/18/23 1538 01/18/23 1950 01/19/23 0248 01/19/23 1453  BP: 105/64 115/63 111/62 (!) 118/90   01/19/23 2005 01/20/23 0644  BP: 111/74 128/73      9. Stage IV  NSCLC w/lymphadenopathy/muscular nodules: Chemo to resume after discharge. --Per neurology, repeat MRI brain w/ and w/o in 6-8 weeks to continue to monitor for potential metastatic disease.   10. GERD s/p esophageal dilatation: Continue Protonix 40mg  BID. Will add pepcid 20mg  BID also.  11. Anxiety/depression: On Paxil 40mg  daily w/xanax 0.5mg  QHS prn.   12. Hyperlipidemia: On Crestor 20mg  QD  13. Neck pain/Headaches: On PRN Fioricet 2 tabs Q6H, using consistently.  Will add Topamax 25 mg bid.              --lidocaine patch for local measures.              -- Flexeril 5 mg TID PRN for muscle tightness in neck -- bilateral occipital nerve blocks may be helpful if persistent, however in setting of metastic disease with multiple SQ nodules will hold off  -01/19/23 doing some better, cont regimen and monitor  14. Horseness/L-VC paralysis : Has been ongoing since August-->a little worse.               -- SLP for swallow, speech              -- ISB   15. Dizziness/nausea/double vision: R-beating nystagmus on exam. Vestibular eval and treat.  -01/18/23 was getting Zofran inpatient, ordered 4mg  ODT q8h PRN n/v; also has Compazine PRN  16. Persistent cough since CVA. Causing cervicalgia, HA's, and nausea/vomiting with PO intakes.              --Treat cough -rule out aspiration/post obstructive infection.                -- Duo nebs qid added w/tessalon perles prn --may need pulmonary involved if no better.  -01/18/23 CXR yesterday without active disease; seems better today, though poor inspiratory effort. Continue Duonebs, Tessalon 200mg  TID PRN, Robitussin PRN, and monitor  -01/19/23 lungs sound ok today, coughing spells seems to be with PO intake, monitor for now  but no acute change needed today  17. Anemia: Hgb down to 10.5 on 01/10/23, stable at 10.7 on 01/18/23; monitor on weekly labs starting 4/40/10  -Continue Folic acid 1mg  QD, multivitamins  18. Hx of rectal surgery, chronic constipation:  -01/18/23 LBM yesterday, but requests stool softeners given hx of rectal surgery. Ordered Miralax 17g QD + PRN, Colace 100mg  QD + PRN. Has Dulcolax supp PRN, Fleet's PRN.  -01/19/23 no BM in 2 days, doesn't like miralax; change miralax to PRN only, increase Colace to 100mg  BID+PRN, add Senna 8.6mg  QD Give sorbitol x 1 19. LLE pain: nursing reporting this after assessment this morning 01/18/23, per PT reports-- PT concerned for DVT, no significant swelling noted on exam this morning -01/18/23 ordered doppler U/S bilateral LEs to r/o DVT given hx of malignancy, continue above pain meds -01/19/23 DVT study not done yet, will inquire about this. L leg without swelling or tenderness on exam, but still will proceed with dopplers.     LOS: 3 days A FACE TO FACE EVALUATION WAS PERFORMED  Charlett [name] 01/20/2023, 9:39 AM

## 2023-01-20 NOTE — IPOC Note (Signed)
Overall Plan of Care [facility]) Patient Details Name: Patricia [name] MRN: [id number] DOB: 15-Nov-1946  Admitting Diagnosis: Embolic stroke [facility])  Hospital Problems: Principal Problem:   Embolic stroke ([place]) Active Problems:   Nonintractable episodic headache   Pain of left lower extremity     Functional Problem List: Nursing Bowel, Bladder, Safety, Endurance, Medication Management, Pain  PT Balance, Perception, Behavior, Safety, Edema, Sensory, Endurance, Skin Integrity, Motor, Nutrition, Pain  OT Balance, Endurance, Vision, Motor  SLP Cognition  TR         Basic ADL's: OT Bathing, Dressing, Toileting     Advanced  ADL's: OT Simple Meal Preparation     Transfers: PT Bed Mobility, Bed to Chair, Car, Manufacturing systems engineer, Metallurgist: PT Ambulation, Data processing manager, Emergency planning/management officer     Additional Impairments: OT    SLP Swallowing, Firefighter, Attention, Awareness  TR      Anticipated Outcomes Item Anticipated Outcome  Self Feeding Ind  Swallowing  modI   Basic self-care  CGA to ModI  Toileting  CGA to ModI   Bathroom Transfers CGA to [employer]  Bowel/Bladder  manage bowel w mod I and bladder w toileting assist  Transfers  CGA using LRAD  Locomotion  CGA using LRAD  Communication  indep  Cognition  sup  Pain  < 4 with prns  Safety/Judgment  manage w cues   Therapy Plan: PT Intensity: Minimum of 1-2 x/day ,45 to 90 minutes PT Frequency: 5 out of 7 days PT Duration Estimated Length of Stay: 3-3.5 weeks OT Frequency: 5 out of 7 days OT Duration/Estimated Length of Stay: 3-5 wks SLP Intensity: Minumum of 1-2 x/day, 30 to 90 minutes SLP Frequency: 3 to 5 out of 7 days SLP Duration/Estimated Length of Stay: 12-14 days   Team Interventions: Nursing Interventions Patient/Family Education, Pain Management, Medication Management, Bladder Management, Discharge Planning, Bowel Management, Disease Management/Prevention  PT interventions  Ambulation/gait training, Community reintegration, DME/adaptive equipment instruction, Neuromuscular re-education, Psychosocial support, Stair training, UE/LE Strength taining/ROM, Wheelchair propulsion/positioning, Training and development officer, Discharge planning, Functional electrical stimulation, Pain management, Skin care/wound management, Therapeutic Activities, UE/LE Coordination activities, Cognitive remediation/compensation, Disease management/prevention, Functional mobility training, Patient/family education, Splinting/orthotics, Therapeutic Exercise, Visual/perceptual remediation/compensation  OT Interventions Balance/vestibular training, Neuromuscular re-education, Self Care/advanced ADL retraining, Therapeutic Exercise, UE/LE Strength taining/ROM, Therapeutic Activities  SLP Interventions Cognitive remediation/compensation, Environmental controls, Cueing hierarchy, Functional tasks, Therapeutic Activities, Internal/external aids, Therapeutic Exercise, Dysphagia/aspiration precaution training, Patient/family education  TR Interventions    SW/CM Interventions Discharge Planning, Psychosocial Support, Patient/Family Education   Barriers to Discharge MD  Medical stability  Nursing Decreased caregiver support, Home environment access/layout 1 level 4 ste bil rail w spouse ; grand-daughter to assist  PT Inaccessible home environment, Home environment access/layout, Pending chemo/radiation    OT      SLP      SW Decreased caregiver support     Team Discharge Planning: Destination: PT-Home ,OT- Home , SLP-Home Projected Follow-up: PT-24 hour supervision/assistance, Outpatient PT, OT-  None, SLP-Outpatient SLP Projected Equipment Needs: PT-To be determined, OT-  , SLP-None recommended by SLP Equipment Details: PT- , OT-to be determined Patient/family involved in discharge planning: PT- Patient, Family member/caregiver,  OT- , SLP-Patient, Family member/caregiver  MD ELOS: 7-10d Medical  Rehab Prognosis:  Good Assessment:The patient has been admitted for CIR therapies with the diagnosis of Embolic bilateral stroke. The team will be addressing functional mobility, strength, stamina, balance, safety, adaptive techniques and equipment, self-care, bowel and  bladder mgt, patient and caregiver education, NSCLC, dysphonia. Goals have been set at CGA/Mod I. Anticipated discharge destination is home.  See Team Conference Notes for weekly updates to the plan of care

## 2023-01-20 NOTE — Progress Notes (Signed)
Inpatient Rehabilitation Care Coordinator Assessment and Plan Patient Details  Name: Patricia [name] MRN: [id number] Date of Birth: 1946-05-13  Today's Date: 01/20/2023  Hospital Problems: Principal Problem:   Embolic stroke [facility]) Active Problems:   Nonintractable episodic headache   Pain of left lower extremity  Past Medical History:  Past Medical History:  Diagnosis Date   Depression    GERD (gastroesophageal reflux disease)    Hypercholesterolemia    IFG (impaired fasting glucose)    Osteopenia    PONV (postoperative nausea and vomiting)    Past Surgical History:  Past Surgical History:  Procedure Laterality Date   ABDOMINAL HYSTERECTOMY     CATARACT EXTRACTION W/PHACO  09/12/2011   Procedure: CATARACT EXTRACTION PHACO AND INTRAOCULAR LENS PLACEMENT ([place]);  Surgeon: Tonny Branch;  Location: [facility] OR[facility];  Service: Ophthalmology;  Laterality: Left;  CDE: 8.91   CATARACT EXTRACTION W/PHACO  11/18/2011   Procedure: CATARACT EXTRACTION PHACO AND INTRAOCULAR LENS PLACEMENT (I[facility]);  Surgeon: Tonny Branch;  Location: [facility] ORS;  Service: Ophthalmology;  Laterality: Right;  CDE:10.26   COLONOSCOPY  2007   Dr. Gala Romney: internal hemorrhoids, single anal papilla poor prep.    COLONOSCOPY N/A 03/27/2017   Rourk: Normal exam.   ECTOPIC PREGNANCY SURGERY     removal of right tube   ESOPHAGOGASTRODUODENOSCOPY (EGD) WITH PROPOFOL N/A 09/10/2021   Procedure: ESOPHAGOGASTRODUODENOSCOPY (EGD) WITH PROPOFOL;  Surgeon: Daneil Dolin, MD;  Location: [facility] END[facility] SU[facility];  Service: Endoscopy;  Laterality: N/A;  8:15AM   MALONEY DILATION N/A 09/10/2021   Procedure: Venia Minks DILATION;  Surgeon: Daneil Dolin, MD;  Location: [facility];  Service: Endoscopy;  Laterality: N/A;   OVARIAN CYST REMOVAL     right   RECTOCELE REPAIR N/A 01/18/2014   Procedure: POSTERIOR REPAIR (RECTOCELE);  Surgeon: Jonnie Kind, MD;  Location: [facility] ORS;  Service: Gynecology;  Laterality: N/A;   VAGINAL HYSTERECTOMY N/A 01/18/2014    Procedure: HYSTERECTOMY VAGINAL;  Surgeon: Jonnie Kind, MD;  Location: [facility];  Service: Gynecology;  Laterality: N/A;   Social History:  reports that she quit smoking about 38 years ago. Her smoking use included cigarettes. She has a 20.00 pack-year smoking history. She has never used smokeless tobacco. She reports that she does not drink alcohol and does not use drugs.  Family / Support Systems Marital Status: Married Patient Roles: Spouse, Parent, Other (Comment) (Grandparent) Spouse/Significant Other: [name]- [phone]-cell [phone] [name] Other Supports: [name] granddaughter [phone] Anticipated Caregiver: Husband and granddaughter Ability/Limitations of Caregiver: Husband is 49 yo and HOH and has health issues of his own, while granddaughter does not work but has three children she cares for-all school age Caregiver Availability: 24/7 Family Dynamics: Close with family and friends, she hopes between family and friends she will have the support she needs and hopes to be mod/i level by discharge.  Social History Preferred language: English Religion: Baptist Cultural Background: No issues Education: [place] - How often do you need to have someone help you when you read instructions, pamphlets, or other written material from your doctor or pharmacy?: Never Writes: Yes Employment Status: Retired Public relations account executive Issues: No issues Guardian/Conservator: None-according to MD pt is capable of making her own decisions while here.   Abuse/Neglect Abuse/Neglect Assessment Can Be Completed: Yes Physical Abuse: Denies Verbal Abuse: Denies Sexual Abuse: Denies Exploitation of patient/patient's resources: Denies Self-Neglect: Denies  Patient response to: Social Isolation - How often do you feel lonely or isolated from those around you?: Never  Emotional  Status Pt's affect, behavior and adjustment status: Pt is motivated to do well but hopes the MD can manage her  headache she has had since stroke. She will push herself in therapies and will try to make porgress while here-she does whisper while talking Recent Psychosocial Issues: other health issues Psychiatric History: No history is able to explain her stroke and verbalize her concerns may benefit from seeing neuro-psych whlle here Substance Abuse History: No issues  Patient / Family Perceptions, Expectations & Goals Pt/Family understanding of illness & functional limitations: Pt is able to explain her stroke and deficits, she does talk with the MD daily and feels her questions and concerns are being addressed. Premorbid pt/family roles/activities: Wife, mom, grandmother, retiree, freind, church member Anticipated changes in roles/activities/participation: resume Pt/family expectations/goals: Pt states: " I hope to do well and not need assist my husband is elderly and has his own health issues. "  Recruitment consultant: None Premorbid Home Care/DME Agencies: None Transportation available at discharge: self and husband Is the patient able to respond to transportation needs?: Yes In the past 12 months, has lack of transportation kept you from medical appointments or from getting medications?: No In the past 12 months, has lack of transportation kept you from meetings, work, or from getting things needed for daily living?: No Resource referrals recommended: Neuropsychology  Discharge Planning Living Arrangements: Spouse/significant other Support Systems: Spouse/significant other, Other relatives, Water engineer, Social worker community Type of Residence: Private residence Insurance Resources: Multimedia programmer (specify) Scientist, clinical (histocompatibility and immunogenetics) Medicare) Financial Resources: Social Security, Family Support Financial Screen Referred: No Living Expenses: Own Money Management: Patient, Spouse Does the patient have any problems obtaining your medications?: No Home Management: self Patient/Family  Preliminary Plans: Return home with husband and granddaughter assisting hopefully pt will be supervision level at discharge. Will work on discharge needs. Care Coordinator Barriers to Discharge: Decreased caregiver support Care Coordinator Anticipated Follow Up Needs: HH/OP  Clinical Impression Pleasant female who is motivated to do well and recover from this stroke. Her husband is supportive but has health issues of his own and can do supervision-light min assist.Granddaughter can assist some also. Aware this social worker is covering for her's who will return tomorrow.  Elease Hashimoto 01/20/2023, 10:56 AM

## 2023-01-20 NOTE — Progress Notes (Signed)
Pt is having choking episodes after intakes of thin liquid. Pt son called out stating that the patient was turning blue after choking on water. This nurse will give ice chips throughout the night until speech evaluates in the am. Pt also requested an enema stating that she is constipated. No further concerns at this time. Call bell in reach

## 2023-01-20 NOTE — Progress Notes (Signed)
Patient ID: MONSERRAT VIDAURRI, female   DOB: July 05, 1946, 77 y.o.   MRN: [id number] Met with the patient to review current situation, rehab process, team conference and plan of care. Discussed new dx of NSCLC, Jan 2024 with hoarseness, N/V and por appetite complicated by CVA. Reviewed medications for secondary risk management including Eliquis. Discussed dietary modifications and medications available for nausea along with Vit D and folic acid/multi vitamin.Patient also reports headaches; nurse aware and administering pain medications routinely. Continue to follow along to address educational needs to facilitate preparation for discharge. Margarito Liner

## 2023-01-20 NOTE — Progress Notes (Signed)
Physical Therapy Session Note  Patient Details  Name: Patricia [name] MRN: [id number] Date of Birth: [birth year]  Today's Date: 01/20/2023 PT Individual Time: 1257-1405 PT Individual Time Calculation (min): 68 min   Short Term Goals: Week 1:  PT Short Term Goal 1 (Week 1): Pt will perform supine<>sit with min assist PT Short Term Goal 2 (Week 1): Pt will perform sit<>stands using LRAD with min assist PT Short Term Goal 3 (Week 1): Pt will perform bed<>chair transfers using LRAD with min assist PT Short Term Goal 4 (Week 1): Pt will ambulate at least 66ft using LRAD with mod assist of 1 (+2 w/c follow if needed) PT Short Term Goal 5 (Week 1): Pt will initiate stair navigation training  Skilled Therapeutic Interventions/Progress Updates:    Chart reviewed and pt agreeable to therapy. Pt received seated in WC with no c/o pain. Also of note, pt mentioned having strong cough after eating chicken noodle soup. Session focused on functional transfers, balance, and home safety to promote safe home access. Pt initiated session with sit to stand using ModA + RW. PT then reviewed standing technique with pt, and pt then stood with MinA + RW + VC for sequencing. Pt then taken to therapy gym for time management. In gym, pt completed 5 x 55min marches on Kinet[place]. Pt then stood with MinA + RW + VC but required ModA to mitigate L lateral lean. PT placed mirror in front of pt. Pt then stood with same assist level and CGA for balance with periodic MinA that pt recovered with VC and visual feedback. Pt then began to c/o nausea. Pt returned to room for continued therapy in cause of vomiting. In room, pt stood with MinA + RW and demonstrated ability to use vertical lines in room to manage upright posture. Pt then c/o increasing nausea. PT called RN to room for medication. While taking medication via applesauce, pt sipped water and began coughing. Coughing strength increased. Pt then began to vomit and have nosebleed. RN  present to assist. Pt assisted with nose bleed management. BP assessed and found to be 135/68mmHg. PT and RN continued to assist pt. Once settled, pt completed SPT to bed with MinA + 2 + no AD. Pt returned to bed with SBA. PT inquired about home set up and discussed safe home access with pt.At end of session, pt was left semi-reclined in bed with alarm engaged, nurse call bell and all needs in reach.     Therapy Documentation Precautions:  Precautions Precautions: Fall, Other (comment) Precaution Comments: L hemiparesis, L lean Restrictions Weight Bearing Restrictions: No General: PT Amount of Missed Time (min): 7 Minutes PT Missed Treatment Reason: Patient ill (Comment) (nausea + vomitting + nose bleed)    Therapy/Group: Individual Therapy  Marquette Old, PT, DPT 01/20/2023, 2:07 PM

## 2023-01-20 NOTE — Progress Notes (Signed)
VASCULAR LAB    Bilateral lower extremity venous duplex has been performed.  See CV proc for preliminary results.  Gave verbal report   Julyan Gales, RVT 01/20/2023, 3:10 PM

## 2023-01-20 NOTE — Progress Notes (Signed)
Inpatient Rehabilitation Center Individual Statement of Services  Patient Name:  Patricia [name]  Date:  01/20/2023  Welcome to the [place].  Our goal is to provide you with an individualized program based on your diagnosis and situation, designed to meet your specific needs.  With this comprehensive rehabilitation program, you will be expected to participate in at least 3 hours of rehabilitation therapies Monday-Friday, with modified therapy programming on the weekends.  Your rehabilitation program will include the following services:  Physical Therapy (PT), Occupational Therapy (OT), Speech Therapy (ST), 24 hour per day rehabilitation nursing, Therapeutic Recreaction (TR), Neuropsychology, Care Coordinator, Rehabilitation Medicine, Nutrition Services, and Pharmacy Services  Weekly team conferences will be held on Wednesday to discuss your progress.  Your Inpatient Rehabilitation Care Coordinator will talk with you frequently to get your input and to update you on team discussions.  Team conferences with you and your family in attendance may also be held.  Expected length of stay: 3-3.5 weeks  Overall anticipated outcome: CGA-min level  Depending on your progress and recovery, your program may change. Your Inpatient Rehabilitation Care Coordinator will coordinate services and will keep you informed of any changes. Your Inpatient Rehabilitation Care Coordinator's name and contact numbers are listed  below.  The following services may also be recommended but are not provided by the [place] will be made to provide these services after discharge if needed.  Arrangements include referral to agencies that provide these services.  Your insurance has been verified to be:  [place] primary doctor is:  Curt Bears  Pertinent  information will be shared with your doctor and your insurance company.  Inpatient Rehabilitation Care Coordinator:  Erlene Quan, [place] or [phone]  Information discussed with and copy given to patient by: Elease Hashimoto, 01/20/2023, 10:58 AM

## 2023-01-20 NOTE — Progress Notes (Addendum)
Patient reported to have BLE DVT--RLE from distal femoral down and LLE w/calf DVT. Discussed with Dr.Kirsteins and w/pharm. Will start on treatment dose X 7 days. She did have a nose bleed that lasted a few minutes then resolved. Also reported to have episode of NV which was worse followed by emesis and cough.She continues to have cough followed by vomiting and severe HA.  She reports that cough is no better and that she was having nose bleeds PTA. Duo nebs discontinued by RT. Will get 2V CXR. Schedule tessalon perles. Marland Kitchen

## 2023-01-20 NOTE — Progress Notes (Addendum)
Physical Therapy Session Note  Patient Details  Name: Patricia [name] MRN: [id number] Date of Birth: [birth year]  Today's Date: 01/20/2023 PT Individual Time: 1050-1150 PT Individual Time Calculation (min): 60 min   Short Term Goals: Week 1:  PT Short Term Goal 1 (Week 1): Pt will perform supine<>sit with min assist PT Short Term Goal 2 (Week 1): Pt will perform sit<>stands using LRAD with min assist PT Short Term Goal 3 (Week 1): Pt will perform bed<>chair transfers using LRAD with min assist PT Short Term Goal 4 (Week 1): Pt will ambulate at least 36ft using LRAD with mod assist of 1 (+2 w/c follow if needed) PT Short Term Goal 5 (Week 1): Pt will initiate stair navigation training  Skilled Therapeutic Interventions/Progress Updates:    Pt presents in w/c still reporting pain in L calf and stating that is was impacting her tolerance to standing in earlier session. Still awaiting doppler studies and no formal orders or recommendations at this time, so just worked within patient tolerance this session.   Focused on w/c mobility training with BUE for functional strengthening, coordination, and mobility training - pt required mod verbal and tactile cues for technique and overall decreased awareness on L. Extra time needed for task to go x 150' to therapy gym especially with turns and obstacle negotiation. Educated on parts management and set up of w/c placement for transfers.   Performed min to mod assist stand pivot transfer w/c <> mat with cues for hand placement and technique and manual facilitation for weightshifting. Seated EOM maintained sitting balance functionally with supervision. Engaged in propped on elbow on L x 5 reps with focus on technique, strength, and trunk activation/elongation.  NMR during bed mobilty with noted decreased body awareness of LLE but good active movement noted. Engaged in bridging in supine for functional strengthening and focusing on coordination of LLE as well  and to maintain adduction with ball between knees during bridging activity x 10 reps x 2 sets with cues for technique. Supine <> sit with overall min assist but first time coming from supine to sit pt ended up on in prone with legs hanging off mat and LLE sticking out into extension, difficulty problem solving how to adjust. Returned to supine with assist and then performed whole sequence again with min assist and improved technique. Transferred back ot w/c as described above and returned to room with all needs in reach.  Pt easily distracted throughout session, requiring cues for redirection to task. Decreased functional memory noted as well.   Therapy Documentation Precautions:  Precautions Precautions: Fall, Other (comment) Precaution Comments: L hemiparesis, L lean Restrictions Weight Bearing Restrictions: No  Pain: C/o pain in L calf which increases with standing and to touch. MD and RN aware. Pt also reports neck pain and headache. Premedicated.     Therapy/Group: Individual Therapy  Canary Brim Ivory Broad, PT, DPT, CBIS  01/20/2023, 11:59 AM

## 2023-01-21 DIAGNOSIS — I63133 Cerebral infarction due to embolism of bilateral carotid arteries: Secondary | ICD-10-CM | POA: Diagnosis not present

## 2023-01-21 NOTE — Progress Notes (Signed)
PROGRESS NOTE   Subjective/Complaints:  Discussed coughing with SLP , does not appear to be aspiration related although seem to do better with small sips     ROS: +LLE pain with standing, +constipation, +cough-better. Denies fevers, chills, CP, SOB, abd pain, N/V/D,   Objective:   DG Chest 2 View  Result Date: 01/20/2023 CLINICAL DATA:  Cough EXAM: CHEST - 2 VIEW COMPARISON:  01/17/2023 FINDINGS: Low lung volumes. No acute airspace disease or pleural effusion. Normal cardiomediastinal silhouette. Aortic atherosclerosis. No pneumothorax. IMPRESSION: No active cardiopulmonary disease. Low lung volumes. Electronically Signed   By: Donavan Foil M.D.   On: 01/20/2023 20:16   VAS [name] LOWER EXTREMITY VENOUS (DVT)  Result Date: 01/20/2023  Lower Venous DVT Study Patient Name:  Patricia [name]  Date of Exam:   01/20/2023 Medical Rec #: [id number]          Accession #:    [id number] Date of Birth: Sep 08, 1946           Patient Gender: F Patient Age:   77 years Exam Location:  [facility] Procedure:      VAS [name] LOWER EXTREMITY VENOUS (DVT) Referring Phys: PAMELA LOVE --------------------------------------------------------------------------------  Indications: Pain.  Risk Factors: Immobility. Comparison Study: No prior study on file Performing Technologist: Sharion Dove RVS  Examination Guidelines: A complete evaluation includes B-mode imaging, spectral Doppler, color Doppler, and power Doppler as needed of all accessible portions of each vessel. Bilateral testing is considered an integral part of a complete examination. Limited examinations for reoccurring indications may be performed as noted. The reflux portion of the exam is performed with the patient in reverse Trendelenburg.  +---------+---------------+---------+-----------+----------+--------------+ RIGHT    CompressibilityPhasicitySpontaneityPropertiesThrombus Aging  +---------+---------------+---------+-----------+----------+--------------+ CFV      Full           Yes      Yes                                 +---------+---------------+---------+-----------+----------+--------------+ SFJ      Full                                                        +---------+---------------+---------+-----------+----------+--------------+ FV Prox  Full                                                        +---------+---------------+---------+-----------+----------+--------------+ FV Mid   Full                                                        +---------+---------------+---------+-----------+----------+--------------+ FV DistalNone                                                        +---------+---------------+---------+-----------+----------+--------------+  PFV      Full                                                        +---------+---------------+---------+-----------+----------+--------------+ POP      None           Yes      Yes                                 +---------+---------------+---------+-----------+----------+--------------+ PTV      None                                         Acute          +---------+---------------+---------+-----------+----------+--------------+ PERO     None                                         Acute          +---------+---------------+---------+-----------+----------+--------------+   +---------+---------------+---------+-----------+----------+--------------+ LEFT     CompressibilityPhasicitySpontaneityPropertiesThrombus Aging +---------+---------------+---------+-----------+----------+--------------+ CFV      Full           Yes      Yes                                 +---------+---------------+---------+-----------+----------+--------------+ SFJ      Full                                                         +---------+---------------+---------+-----------+----------+--------------+ FV Prox  Full                                                        +---------+---------------+---------+-----------+----------+--------------+ FV Mid   Full                                                        +---------+---------------+---------+-----------+----------+--------------+ FV DistalNone                                         Acute          +---------+---------------+---------+-----------+----------+--------------+ PFV      Full                                                        +---------+---------------+---------+-----------+----------+--------------+  POP      Full           Yes      Yes                                 +---------+---------------+---------+-----------+----------+--------------+ PTV      None                                         Acute          +---------+---------------+---------+-----------+----------+--------------+ PERO     None                                         Acute          +---------+---------------+---------+-----------+----------+--------------+ Gastroc  Full                                                        +---------+---------------+---------+-----------+----------+--------------+    Summary: RIGHT: - Findings consistent with acute deep vein thrombosis involving the right popliteal vein, right posterior tibial veins, right peroneal veins, and distal right femoral vein.  LEFT: - Findings consistent with acute deep vein thrombosis involving the left posterior tibial veins, and left peroneal veins.  *See table(s) above for measurements and observations.    Preliminary    Recent Labs    01/20/23 0838  WBC 4.9  HGB 10.7*  HCT 33.9*  PLT 275    Recent Labs    01/20/23 0838  NA 136  K 3.4*  CL 106  CO2 23  GLUCOSE 111*  BUN 12  CREATININE 0.86  CALCIUM 9.1    No intake or output data in the 24 hours ending  01/21/23 0954       Physical Exam: Vital Signs Blood pressure (!) 149/77, pulse 88, temperature 98.4 F (36.9 C), temperature source Oral, resp. rate 17, height 4' 9.99" (1.473 m), weight 64.5 kg, SpO2 97 %.   General: No acute distress Mood and affect are appropriate Heart: Regular rate and rhythm no rubs murmurs or extra sounds Lungs: Clear to auscultation, breathing unlabored, no rales or wheezes Abdomen: Positive bowel sounds, soft nontender to palpation, nondistended Extremities: No clubbing, cyanosis, or edema, moderate left and mild right popliteal tenderness , neg Homan bilateral  Skin: No evidence of breakdown, no evidence of rash  Prior exam: MSK:      No apparent deformity.      Strength:                RUE: 5/5 SA, 5/5 EF, 5/5 EE, 5/5 WE, 5/5 FF, 5/5 FA                 LUE: 4/5 SA, 4/5 EF, 4/5 EE, 4/5 WE, 4/5 FF, 4/5 FA                 RLE: 5/5 HF, 5/5 KE, 5/5 DF, 5/5 EHL, 5/5 PF                 LLE:  4/5 HF, 4/5 KE, 4/5 DF, 4/5 EHL, 4/5  PF    Neurologic exam:  Cognition: AAO to person, place, time and event.  Language: Fluent, No substitutions or neoglisms. +Hoarse, breathy vocal quality. Names 3/3 objects correctly.   Mood: Pleasant affect, appropriate mood.  Sensation: To light touch intact in BL UEs and LEs   Coordination: No apparent tremors. No ataxia on FTN, HTS bilaterally.  Spasticity: MAS 0 in all extremities.   Assessment/Plan: 1. Functional deficits which require 3+ hours per day of interdisciplinary therapy in a comprehensive inpatient rehab setting. Physiatrist is providing close team supervision and 24 hour management of active medical problems listed below. Physiatrist and rehab team continue to assess barriers to discharge/monitor patient progress toward functional and medical goals  Care Tool:  Bathing    Body parts bathed by patient: Right arm, Left arm, Chest, Abdomen, Front perineal area, Buttocks, Right upper leg, Left upper leg, Face    Body parts bathed by helper: Left lower leg, Right lower leg     Bathing assist Assist Level: Minimal Assistance - Patient > 75%     Upper Body Dressing/Undressing Upper body dressing   What is the patient wearing?: Pull over shirt    Upper body assist Assist Level: Set up assist    Lower Body Dressing/Undressing Lower body dressing      What is the patient wearing?: Pants, Incontinence brief     Lower body assist Assist for lower body dressing: Maximal Assistance - Patient 25 - 49%     Toileting Toileting    Toileting assist Assist for toileting: Minimal Assistance - Patient > 75%     Transfers Chair/bed transfer  Transfers assist     Chair/bed transfer assist level: Moderate Assistance - Patient 50 - 74%     Locomotion Ambulation   Ambulation assist      Assist level: 2 helpers Assistive device: Hand held assist Max distance: 63ft   Walk 10 feet activity   Assist     Assist level: 2 helpers Assistive device: Hand held assist   Walk 50 feet activity   Assist Walk 50 feet with 2 turns activity did not occur: Safety/medical concerns         Walk 150 feet activity   Assist Walk 150 feet activity did not occur: Safety/medical concerns         Walk 10 feet on uneven surface  activity   Assist Walk 10 feet on uneven surfaces activity did not occur: Safety/medical concerns         Wheelchair     Assist Is the patient using a wheelchair?: Yes Type of Wheelchair: Manual    Wheelchair assist level: Moderate Assistance - Patient 50 - 74% Max wheelchair distance: 150'    Wheelchair 50 feet with 2 turns activity    Assist        Assist Level: Moderate Assistance - Patient 50 - 74%   Wheelchair 150 feet activity     Assist      Assist Level: Moderate Assistance - Patient 50 - 74%   Blood pressure (!) 149/77, pulse 88, temperature 98.4 F (36.9 C), temperature source Oral, resp. rate 17, height 4' 9.99"  (1.473 m), weight 64.5 kg, SpO2 97 %.  Medical Problem List and Plan: 1. 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 metastatic             -patient may shower             -ELOS/Goals: 12-14 days, SPV  PT/OT/SLP goals  -Continue CIR 2. Antithrombotics: -DVT/anticoagulation:  Pharmaceutical: Eliquis 5 mg BID -antiplatelet therapy: N/A - was on ASA and Plavix prior to stroke, now on Eliquis only   3. LBP/Pain Management: Will continue tylenol 650mg  q6h PRN for now.  --May need something stronger for HA. Reports back pain better since chemo.   --Tylenol, flexeril 5mg  TID PRN, fioricet 2 tabs q6h PRN, Lidoderm patch QD   4. Mood/Behavior/Sleep: LCSW to follow for evaluation and support.                       --Melatonin 6mg  QHS PRN, Trazodone prn for insomnia.        -antipsychotic agents: N/A 5. Neuropsych/cognition: This patient is capable of making decisions on her own behalf. 6. Skin/Wound Care: Routine pressure relief measures.   7. Fluids/Electrolytes/Nutrition: Monitor I/O. Monitor weekly labs starting 01/20/23 -01/18/23 CMP unremarkable, monitor on weekly labs   8. HTN: Monitor BP TID--continue to hold Valsartan and HCTZ.              --SBP trending 100- 140 range. Avoid hypoperfusion   -01/19/23 BPs stable, cont to monitor Vitals:   01/17/23 2027 01/18/23 0330 01/18/23 1243 01/18/23 1538  BP: 130/71 123/72 (!) 121/52 105/64   01/18/23 1950 01/19/23 0248 01/19/23 1453 01/19/23 2005  BP: 115/63 111/62 (!) 118/90 111/74   01/20/23 0644 01/20/23 1408 01/20/23 2023 01/21/23 0259  BP: 128/73 129/79 (!) 141/76 (!) 149/77      9. Stage IV  NSCLC w/lymphadenopathy/muscular nodules: Chemo to resume after discharge. --Per neurology, repeat MRI brain w/ and w/o in 6-8 weeks to continue to monitor for potential metastatic disease.   10. GERD s/p esophageal dilatation: Continue Protonix 40mg  BID. Will add pepcid 20mg  BID also.  11.  Anxiety/depression: On Paxil 40mg  daily w/xanax 0.5mg  QHS prn.   12. Hyperlipidemia: On Crestor 20mg  QD  13. Neck pain/Headaches: On PRN Fioricet 2 tabs Q6H, using consistently.  Will add Topamax 25 mg bid.              --lidocaine patch for local measures.              -- Flexeril 5 mg TID PRN for muscle tightness in neck -- bilateral occipital nerve blocks may be helpful if persistent, however in setting of metastic disease with multiple SQ nodules will hold off  -01/19/23 doing some better, cont regimen and monitor  14. Horseness/L-VC paralysis : Has been ongoing since August-->a little worse.               -- SLP for swallow, speech              -- ISB   15. Dizziness/nausea/double vision: R-beating nystagmus on exam. Vestibular eval and treat.  -01/18/23 was getting Zofran inpatient, ordered 4mg  ODT q8h PRN n/v; also has Compazine PRN  16. Persistent cough since CVA. Causing cervicalgia, HA's, and nausea/vomiting with PO intakes.              --Treat cough -rule out aspiration/post obstructive infection.                -- Duo nebs qid added w/tessalon perles prn --may need pulmonary involved if no better.  -01/18/23 CXR yesterday without active disease; seems better today, though poor inspiratory effort. Continue Duonebs, Tessalon 200mg  TID PRN, Robitussin PRN, and monitor  -01/19/23 lungs sound ok today, coughing spells seems to be with PO intake, monitor for now but  no acute change needed today  17. Anemia: Hgb down to 10.5 on 01/10/23, stable at 10.7 on 01/18/23; monitor on weekly labs starting 08/28/79  -Continue Folic acid 1mg  QD, multivitamins  18. Hx of rectal surgery, chronic constipation:  -01/18/23 LBM yesterday, but requests stool softeners given hx of rectal surgery. Ordered Miralax 17g QD + PRN, Colace 100mg  QD + PRN. Has Dulcolax supp PRN, Fleet's PRN.  -01/19/23 no BM in 2 days, doesn't like miralax; change miralax to PRN only, increase Colace to 100mg  BID+PRN, add Senna 8.6mg   QD Give sorbitol x 1 19. LLE pain: nursing reporting this after assessment this morning 01/18/23, per PT reports-- PT concerned for DVT, no significant swelling noted on exam this morning -01/18/23 ordered doppler U/S bilateral LEs to r/o DVT given hx of malignancy, continue above pain meds + DVT , increase Eliquis to 10mg  BID- may cont therapy     LOS: 4 days A FACE TO FACE EVALUATION WAS PERFORMED  Charlett [name] 01/21/2023, 9:54 AM

## 2023-01-21 NOTE — Progress Notes (Signed)
Occupational Therapy Session Note  Patient Details  Name: Patricia [name] MRN: [id number] Date of Birth: 01/06/1946  Today's Date: 01/21/2023 OT Individual Time: 0800-0900 OT Individual Time Calculation (min): 60 min   Short Term Goals: Week 1:  OT Short Term Goal 1 (Week 1): The pt will transfer to all surfaces with MinA at 95% safe OT Short Term Goal 2 (Week 1): The pt will complete LB dressing with MinA at 95% safe OT Short Term Goal 3 (Week 1): The pt will tolerate >45 minutes of activity with minimal rest breaks @ 95% safe OT Short Term Goal 4 (Week 1): The pt will complete toileting with MinA by incorporating the grab bars  @ 95% safe  Skilled Therapeutic Interventions/Progress Updates:     Pt received sitting up in bed presenting to be in good spirits with husband, [name], present in room. Pt reporting mild headache this AM- Rn in/out to provide medications, OT offering environmental modifications, rest breaks, and repositioning. Pt mildly nervous upon OT arrival when requested to sit EOB stating, "I have blood clots, I am not allowed to move". Education provided on DVT treatment as Pt had started blood thinners. Private messaged MD to confirm Pt was safe to mobilize to further provide Pt assurance.   Pt transitioned to EOB with supervision. Pt sit>stand CGA with cueing required for body mechanics and technique. Functional mobility training in room to bathroom ~15 ft with min to mod A and max VB and tactile cues for weight shifting, step length, and foot positioning. Pt transitioned to standard toilet mod A and using grab bars. Pt provided increased time on toilet d/t Pt reporting need for BM (continent void, no BM). Pt able to complete posterior peri-care with CGA and bring pants to waist min A. Pt ambulated to wc located in room similar to previous trial. Pt mildly SOB following functional mobility with education provided on PLB'ing.   Pt donned shirt seated in wc supervision and pants  with mod A to weave LLE and maintain balance while bringing pants to waist. Pt completed grooming/hygiene tasks seated in WC utilizing BUEs to manipulate items with supervision. Pt able to locate all items needed on L side of sink without cueing required.   Pt reporting double vision occurring when watching tv. Educated pt on occurrence, cause, and accommodations for double vision with Pt receptive to education. Provided Pt glasses with L lense occluded by transparent tape to decrease double vision and impact on BADLs. Pt receptive and verbalizing understanding of use.   MD entering room at endo of session. Pt was left resting in wc with call bell in reach, seat belt alarm on, and all needs met.  Therapy Documentation Precautions:  Precautions Precautions: Fall, Other (comment) Precaution Comments: L hemiparesis, L lean Restrictions Weight Bearing Restrictions: No General:     ADL: ADL Equipment Provided: Reacher Eating: Not assessed (based on observation pt is s/u assist does present with swallowing challenges encouraged to tuck chin to reduce incidence for adverse response.) Where Assessed-Eating: Wheelchair Grooming: Setup Where Assessed-Grooming: Sitting at sink Upper Body Bathing: Setup Where Assessed-Upper Body Bathing: Sitting at sink Lower Body Bathing: Minimal assistance, Moderate assistance Where Assessed-Lower Body Bathing: Sitting at sink Upper Body Dressing: Setup Where Assessed-Upper Body Dressing: Wheelchair Lower Body Dressing: Minimal assistance, Moderate assistance Where Assessed-Lower Body Dressing: Wheelchair Toileting: Modified independent, Contact guard (secondary to challenges with transfer from w/c to commode needing additional time) Where Assessed-Toileting: Glass blower/designer: Moderate assistance, Minimal assistance (using  grab bar and additional time.) Toilet Transfer Method: Stand pivot Science writer: Grab bars (grab bar and arm of  w/c) Tub/Shower Transfer: Minimal assistance, Moderate assistance (based on observation of toilet transfer.) Social research officer, government: Not assessed   Therapy/Group: Individual Therapy  [name] 01/21/2023, 7:48 AM

## 2023-01-21 NOTE — Progress Notes (Signed)
Speech Language Pathology Daily Session Note  Patient Details  Name: Patricia [name] MRN: [id number] Date of Birth: [birth year]  Today's Date: 01/21/2023 SLP Individual Time: 0700-0740 SLP Individual Time Calculation (min): 40 min  Short Term Goals: Week 1: SLP Short Term Goal 1 (Week 1): Pt will demonstrate increased error awareness during therapeutic task with minA verbal cues. SLP Short Term Goal 2 (Week 1): Pt will solve complex problems with minA verbal cues. SLP Short Term Goal 3 (Week 1): Pt will recall new information with minA verbal cues or visual aids. SLP Short Term Goal 4 (Week 1): Pt will tolerate least restrictive diet using safe swallow strategies with sup.  Skilled Therapeutic Interventions: Skilled treatment session focused on dysphagia goals. Upon arrival, patient was awake in bed and consuming ice chips. Patient reports that she has not been drinking liquids for the last 2 days due to ongoing coughing when drinking. During conversation, patient with significant coughing without food or liquid present. MBS reviewed and no overt s/s of aspiration observed with thin liquids. Patient consumed thin liquids via tsp without overt s/s of aspiration and felt "comfortable" with the bolus size. Patient then given a 5cc Provale cup in which patient consumed several sips without overt s/s of aspiration. Therefore, recommend patient consume thin liquids via 5cc Provale cup. Patient verbalized understanding and agreement. SLP also provided education regarding proper positioning for PO intake and reflux precautions. Patient left upright in bed with alarm on and family present. Continue with current plan of care.      Pain Pain Assessment Pain Scale: 0-10 Pain Score: 3  Pain Type: Acute pain Pain Location: Head Pain Descriptors / Indicators: Aching Pain Frequency: Constant Pain Onset: On-going Patients Stated Pain Goal: 2 Pain Intervention(s): Medication (See eMAR)  Therapy/Group:  Individual Therapy  Taji Barretto 01/21/2023, 10:29 AM

## 2023-01-21 NOTE — Progress Notes (Signed)
Physical Therapy Session Note  Patient Details  Name: Patricia [name] MRN: [id number] Date of Birth: 1946-07-07  Today's Date: 01/21/2023 PT Individual Time: 1004-1100 PT Individual Time Calculation (min): 56 min   Short Term Goals: Week 1:  PT Short Term Goal 1 (Week 1): Pt will perform supine<>sit with min assist PT Short Term Goal 2 (Week 1): Pt will perform sit<>stands using LRAD with min assist PT Short Term Goal 3 (Week 1): Pt will perform bed<>chair transfers using LRAD with min assist PT Short Term Goal 4 (Week 1): Pt will ambulate at least 65ft using LRAD with mod assist of 1 (+2 w/c follow if needed) PT Short Term Goal 5 (Week 1): Pt will initiate stair navigation training  Skilled Therapeutic Interventions/Progress Updates:    Per MD, no restrictions on mobility due to recent DVT findings.  Pt received supine in bed with her husband, Patricia [name], present and pt agreeable to therapy session. Reporting significant pounding headache pain - nurse notified and present for medication administration.  Supine>sitting L EOB, HOB elevated and using bedrail, with close supervision for safety. R stand pivot EOB>w/c B UE support on therapist or armrest with min assist for lifting to stand and balance while turning.  Reports need to use bathroom. Transported in/out bathroom in w/c.   Stand pivot w/c<>toilet using UE support on grab bars with min assist for lifting to stand and balance - cuing for standing up right and not leaning posteriorly. Dependent LB clothing management due to urgency. Continent of BM - seated peri-care set-up assist. Transferred out of bathroom in w/c when pt reports feeling urge to void bowels again. Returned to toilet as described above and pt further continent of bowels.   Transported to/from gym in w/c for time management and energy conservation.  R stand pivot w/c>EOM, B UE support on therapist, with min assist for balance due to posterior lean.  Patient participated  in [facility] and demonstrates significantly increased fall risk as noted by score of 5/56.  (<36= high risk for falls, close to 100%; 37-45 significant >80%; 46-51 moderate >50%; 52-55 lower >25%). Pt unable to maintain static standing balance without max/total assist due to persistent posterior lean varying bias R > L with poor balance recovery strategies (severely delayed and inadequate).  Gait training 7ft + 5ft  using R HHA with therapist guarding on L side with posterior lean support - mod assist for balance. Pt demonstrating the following gait deviations with therapist providing the described cuing and facilitation for improvement:  - continues to have increased postural sway in all directions with posterior lean bias primarily R>L - continues to have poor L LE foot clearance and step length - narrow BOS - hinges forward at hips, repeated cuing to improve upright posture and activate glutes - pt counting the steps aloud to improve her focus on the task - benefits from cuing to increase L LE step length but doesn't know L vs R so therapist calls it the therapist's side leg   L stand pivot w/c>EOB with light mod assist due to fatigue at this time and transferring towards L. Sit>supine via reverse logroll technique to increase pt independence with min assist.  At end of session, pt left supine in bed with needs in reach, bed alarm on, and her husband present. Pt requesting to take a nap due to not sleeping well last night.   Therapy Documentation Precautions:  Precautions Precautions: Fall, Other (comment) Precaution Comments: L hemiparesis, L lean  Restrictions Weight Bearing Restrictions: No   Pain:   Reports pounding headache - nurse notified and present for medication administration - modified interventions as able for pain management - utilized distraction and emotional support.   Balance: Standardized Balance Assessment Standardized Balance Assessment: Berg Balance  Test Berg Balance Test Sit to Stand: Needs minimal aid to stand or to stabilize Standing Unsupported: Unable to stand 30 seconds unassisted (pt with persistent posterior lean, unable to maintain standing without max/total assist) Sitting with Back Unsupported but Feet Supported on Floor or Stool: Able to sit 2 minutes under supervision Stand to Sit: Needs assistance to sit Transfers: Needs one person to assist Standing Unsupported with Eyes Closed: Needs help to keep from falling Standing Ubsupported with Feet Together: Needs help to attain position and unable to hold for 15 seconds From Standing, Reach Forward with Outstretched Arm: Loses balance while trying/requires external support From Standing Position, Pick up Object from Floor: Unable to try/needs assist to keep balance From Standing Position, Turn to Look Behind Over each Shoulder: Needs assist to keep from losing balance and falling Turn 360 Degrees: Needs assistance while turning Standing Unsupported, Alternately Place Feet on Step/Stool: Needs assistance to keep from falling or unable to try Standing Unsupported, One Foot in Front: Loses balance while stepping or standing Standing on One Leg: Unable to try or needs assist to prevent fall Total Score: 5   Therapy/Group: Individual Therapy  Tawana Scale , PT, DPT, NCS, CSRS 01/21/2023, 7:54 AM

## 2023-01-21 NOTE — Progress Notes (Signed)
Physical Therapy Session Note  Patient Details  Name: Patricia [name] MRN: [id number] Date of Birth: [birth year]  Today's Date: 01/21/2023 PT Individual Time: 1700-1749 PT Individual Time Calculation (min): 45 min   Short Term Goals: Week 1:  PT Short Term Goal 1 (Week 1): Pt will perform supine<>sit with min assist PT Short Term Goal 2 (Week 1): Pt will perform sit<>stands using LRAD with min assist PT Short Term Goal 3 (Week 1): Pt will perform bed<>chair transfers using LRAD with min assist PT Short Term Goal 4 (Week 1): Pt will ambulate at least 70ft using LRAD with mod assist of 1 (+2 w/c follow if needed) PT Short Term Goal 5 (Week 1): Pt will initiate stair navigation training  Skilled Therapeutic Interventions/Progress Updates:    pt received in bed and agreeable to therapy. Pt requesting to use bathroom. Supine>sit with increased time and min A for trunk elevation. Sit to stand and ambulatory transfer to bathroom with mod A d/t L lateral and posterior lean. Facilitation for weight shifting and RW management. Mod A for balance for clothing management, supervision hygiene in sitting. Pt stood at sink in same manner to wash hands. Reported feeling dizzy after standing and walking, but BP=148/73 (95). Pt reports symptoms fade with rest. Pt transported to therapy gym for time management and energy conservation. NMR focused on Sit to stand with therapist positioned in front of pt. Pt did well when told to reach anteriorly for mat table then bring hands to therapist's shoulders. In that position, pt was able to feel and correct lean, but only for 3-5 seconds before leaning again and needing to correct again. Pt returned to room and to bed after session with mod A Stand pivot transfer with RW, sit>supine with min A for LE management. Pt was left with all needs in reach and alarm active.   Therapy Documentation Precautions:  Precautions Precautions: Fall, Other (comment) Precaution Comments: L  hemiparesis, L lean Restrictions Weight Bearing Restrictions: No General:       Therapy/Group: Individual Therapy  Mickel Fuchs 01/21/2023, 3:59 PM

## 2023-01-22 DIAGNOSIS — I63133 Cerebral infarction due to embolism of bilateral carotid arteries: Secondary | ICD-10-CM | POA: Diagnosis not present

## 2023-01-22 MED ORDER — BISACODYL 10 MG RE SUPP
10.0000 mg | Freq: Every day | RECTAL | Status: DC
  Administered 2023-01-22 – 2023-01-24 (×2): 10 mg via RECTAL
  Filled 2023-01-22 (×2): qty 1

## 2023-01-22 MED ORDER — ENSURE ENLIVE PO LIQD
237.0000 mL | Freq: Two times a day (BID) | ORAL | Status: DC
  Administered 2023-01-23 – 2023-01-25 (×4): 237 mL via ORAL

## 2023-01-22 MED ORDER — TOPIRAMATE 25 MG PO TABS
50.0000 mg | ORAL_TABLET | Freq: Two times a day (BID) | ORAL | Status: DC
  Administered 2023-01-22 – 2023-01-29 (×14): 50 mg via ORAL
  Filled 2023-01-22 (×14): qty 2

## 2023-01-22 MED ORDER — SENNOSIDES-DOCUSATE SODIUM 8.6-50 MG PO TABS
3.0000 | ORAL_TABLET | Freq: Two times a day (BID) | ORAL | Status: DC
  Administered 2023-01-22 – 2023-01-24 (×5): 3 via ORAL
  Filled 2023-01-22 (×6): qty 3

## 2023-01-22 MED FILL — Dexamethasone Sodium Phosphate Inj 100 MG/10ML: INTRAMUSCULAR | Qty: 1 | Status: AC

## 2023-01-22 MED FILL — Fosaprepitant Dimeglumine For IV Infusion 150 MG (Base Eq): INTRAVENOUS | Qty: 5 | Status: AC

## 2023-01-22 NOTE — Progress Notes (Signed)
Physical Therapy Session Note  Patient Details  Name: Patricia [name] MRN: [id number] Date of Birth: [birth year]  Today's Date: 01/22/2023 PT Individual Time: 0921-1033 PT Individual Time Calculation (min): 72 min   Short Term Goals: Week 1:  PT Short Term Goal 1 (Week 1): Pt will perform supine<>sit with min assist PT Short Term Goal 2 (Week 1): Pt will perform sit<>stands using LRAD with min assist PT Short Term Goal 3 (Week 1): Pt will perform bed<>chair transfers using LRAD with min assist PT Short Term Goal 4 (Week 1): Pt will ambulate at least 52ft using LRAD with mod assist of 1 (+2 w/c follow if needed) PT Short Term Goal 5 (Week 1): Pt will initiate stair navigation training  Skilled Therapeutic Interventions/Progress Updates:    Pt received sitting on toilet with NT present and pt agreeable to therapy session, therapist assumed care of pt. Pt reports she voided bladder and had already performed peri-care. Sit>stand toilet>UE support on grab bars with light min assist - dependent brief management. L stand pivot toilet>w/c UE support on grab bar with min assist for balance and for pivoting hips fully. Transported to sink in w/c. Sit>stand w/c>sink with min assist for lifting to stand/balance - performed hand hygiene with up to heavy mod/max assist for balance due to pt having repeated posterior LOB with delayed awareness and poor attention to it, so no true balance recovery strategy initiated. Sitting in w/c, donned UB clothing set-up assist, and threaded on LB clothing mod assist with pt after recalling she should have threaded in L LE first.   Attempted sit>stand w/c>no AD with pt having repeated posterior lean/LOB and trying to push backs of legs onto seat to come to stand unsuccessfully, requires heavy mod assist and unable to come to standing safely to pull up pants. Transitioned to having w/c at foot of bed and pt coming to stand using UE support on footboard as needed for balance -  continues to require heavy mod assist but able to stand and pull pants up over hips with total assist due to tightness of pants.   Transported to/from gym in w/c for time management and energy conservation.  L stand pivot transfer w/c>EOM with pt continuing to push backs of legs against seat and have strong posterior lean while coming to stand.  Sit<>stand NMR focusing on increasing anterior trunk lean and gaining balance once standing to decrease posterior lean while participating in grabbing squigz from mirror in front of pt to promote anterior trunk hinge at hips and then stnanding to place them at top of mirror to force increased anterior trunk lean - pt has lack of awareness when posterior LOB is occurring with severely delayed attempt at initiating balance recovery   MD in/out for morning assessment.  Gait training 119ft + 169ft (progressing to navigating through 2 doorways) using B HHA from therapist (guarding on L side) with mod assist for balance. Pt demonstrating the following gait deviations with therapist providing the described cuing and facilitation for improvement:  - continues to have increased postural sway in all directions with posterior lean bias primarily  - continues to have poor L LE foot clearance and step length with excessively large R LE step length - narrow BOS - hinges forward at hips with fatigue, cuing to improve upright posture and activate glutes - benefits from cuing to increase L LE step length but doesn't know L vs R but improving  Discussed minimizing dual-tasking during gait (not trying to have  a conversation while walking) but rather focusing on her balance and gait mechanics.  Pt requests to return to room to attempt to void BM.  Transported back to room. Stand pivot w/c>toilet using B UE support on grab bars with min assist - requires max cuing for L LE positioning as she often doesn't step it back. Dependent LB clothing management - continent of bladder  but unable to void bowels (reports she is having trouble having BM, team notified during conference).   Gait training back to bed using [facility] with pt guarding on L side and providing mod assist for balance due to L lateral lean and overall increased postural instability.   Standing hand hygiene at sink with max cuing for L foot placement and mod assist for balance due to L posterior LOB with poor awareness. Sit>supine via reverse logroll technique with mod assist for B LE management. Pt left supine in bed with needs in reach and bed alarm on.    Therapy Documentation Precautions:  Precautions Precautions: Fall, Other (comment) Precaution Comments: L hemiparesis, L lean Restrictions Weight Bearing Restrictions: No   Pain:  Continues to report severe headache pain - MD aware.    Therapy/Group: Individual Therapy  Tawana Scale , PT, DPT, NCS, CSRS 01/22/2023, 7:54 AM

## 2023-01-22 NOTE — Plan of Care (Signed)
  Problem: Consults Goal: RH STROKE PATIENT EDUCATION Description: See Patient Education module for education specifics  Outcome: Progressing   Problem: RH BOWEL ELIMINATION Goal: RH STG MANAGE BOWEL WITH ASSISTANCE Description: STG Manage Bowel with toileting Assistance. Outcome: Progressing Goal: RH STG MANAGE BOWEL W/MEDICATION W/ASSISTANCE Description: STG Manage Bowel with Medication with mod I Assistance. Outcome: Progressing   Problem: RH BLADDER ELIMINATION Goal: RH STG MANAGE BLADDER WITH ASSISTANCE Description: STG Manage Bladder With toileting Assistance Outcome: Progressing   Problem: RH PAIN MANAGEMENT Goal: RH STG PAIN MANAGED AT OR BELOW PT'S PAIN GOAL Description: < 4 with prns Outcome: Progressing   Problem: RH KNOWLEDGE DEFICIT Goal: RH STG INCREASE KNOWLEDGE OF HYPERTENSION Description: Patient and spouse will be able to manage HTN with medications and dietary modifications using educational resources indepdendently Outcome: Progressing Goal: RH STG INCREASE KNOWLEGDE OF HYPERLIPIDEMIA Description: Patient and spouse will be able to manage HLD with medications and dietary modifications using educational resources indepdendently Outcome: Progressing Goal: RH STG INCREASE KNOWLEDGE OF STROKE PROPHYLAXIS Description: Patient and spouse will be able to manage secondary risks with medications and dietary modifications using educational resources indepdendently Outcome: Progressing   Problem: Education: Goal: Knowledge of disease or condition will improve Outcome: Progressing Goal: Knowledge of secondary prevention will improve (MUST DOCUMENT ALL) Outcome: Progressing Goal: Knowledge of patient specific risk factors will improve Elta [place] N/A or DELETE if not current risk factor) Outcome: Progressing   Problem: Ischemic Stroke/TIA Tissue Perfusion: Goal: Complications of ischemic stroke/TIA will be minimized Outcome: Progressing   Problem: Coping: Goal: Will  verbalize positive feelings about self Outcome: Progressing Goal: Will identify appropriate support needs Outcome: Progressing   Problem: Health Behavior/Discharge Planning: Goal: Ability to manage health-related needs will improve Outcome: Progressing Goal: Goals will be collaboratively established with patient/family Outcome: Progressing   Problem: Self-Care: Goal: Ability to participate in self-care as condition permits will improve Outcome: Progressing Goal: Verbalization of feelings and concerns over difficulty with self-care will improve Outcome: Progressing Goal: Ability to communicate needs accurately will improve Outcome: Progressing   Problem: Nutrition: Goal: Risk of aspiration will decrease Outcome: Progressing Goal: Dietary intake will improve Outcome: Progressing

## 2023-01-22 NOTE — Patient Care Conference (Signed)
Inpatient RehabilitationTeam Conference and Plan of Care Update Date: 01/22/2023   Time: 10:50 AM    Patient Name: [place] Record Number: [id number]  Date of Birth: 1946/06/12 Sex: Female         Room/Bed: 4W11C/4W11C-01 Payor Info: Payor: [employer] MEDICARE / Plan: [place] MEDICARE HMO/PPO / Product Type: *No Product type* /    Admit Date/Time:  01/17/2023  3:45 PM  Primary Diagnosis:  Embolic stroke [facility])  Hospital Problems: Principal Problem:   Embolic stroke [facility]) Active Problems:   Nonintractable episodic headache   Pain of left lower extremity    Expected Discharge Date: Expected Discharge Date: 02/01/23  Team Members Present: Physician leading conference: Dr. Alysia Penna Social Worker Present: Erlene Quan, BSW Nurse Present: Dorien Chihuahua, RN PT Present: Page Spiro, PT OT Present: Other (comment) Lowella Fairy, OT) SLP Present: Weston Anna, SLP PPS Coordinator present : Gunnar Fusi, SLP     Current Status/Progress Goal Weekly Team Focus  Bowel/Bladder   pt continent of b/b.   remain continent   Assist with toileting qshift and prn    Swallow/Nutrition/ Hydration   Dys 3 diet with thin liquids (via Provale cup); no mixed consistencies   mod I  tolerance of current diet with implementation of safe swallowing precautions/strategies. Pt prefers dys 3 textures at this time. Assess regular consistencies as tolerated/appropriate    ADL's   UB dressing supervision, LB dressing mod A, UB bathing CGA, LB bathing mod A, grooming/hygiene supervision seated at sink, shower transfer min A stand pivot, toilet transfer min A stand pivot; Pt presenting with posterior and L sided lean requiring assistance to miantain balance   BADLs set-up assist, min A LB dressing, CGA toilet and showe transfer   BADL retraining, R NMR, bed mobility, fucntional mobility training, endurance training, activity tolerance, balance training, d/c planning     Mobility   min assist bed mobility, min/mod assist sit<>stand and stand pivot transfers, mod assist gait up to 5ft using B HHA, severe balance impairment scoring 5/56 on Berg Balance Test on 2/13 with pt having persistent posterior lean requiring total assist to prevent LOB and impaired awareness of when LOB is occuring with poor midline orientation - pt also has impaired attention to task, educated pt on not dual-tasking when mobilizing and always prioritizing her balance   CGA/supervision overall at ambulatory level  activity tolerance, pt/family education, transfer training, bed mobility training, gait training, dynamic standing balance, L hemibody NMR    Communication                Safety/Cognition/ Behavioral Observations  Patient scored 13/30 on SLUMS indicating impaired cognition in the following areas re: memory, language, cognitive flexibility, executive functioning, and visuospatial skills. Assessment limited by fatigue   mod I-to-sup A   focused & sustained attention, compensatory memory strategies, awareness    Pain   pt c/o chronic headache 7/10 pain scale. prn meds given   pain score <3   Assess qshift and prn    Skin   skin intact   Maintain skin integrity  Assess qshift and prn      Discharge Planning:  Discharging home with granddaughter and husband to assist. Spouse is 85 and has health issues. Granddaughter does not work but has 2 school aged children. Able to provide 24/7.   Team Discussion: Patient with coordination and poor standing balance post bilateral strokes. MD increased Eliquis dosage for DVT treatment given hypercoagulation state given  new NSCLC diagnosis stage IV, undergoing chemo which is on hold until after rehab . Note poor appetite with N/V and fatigue.  Patient on target to meet rehab goals: yes, currently needs supervision for upper body care and mod assist for lower body care. Needs min assist for transfers and stand pivot transfers due  to left posterior leaning. With grab bars can stand with mod assist but is not able to stand with portable equipment. Needs mod - total assist for standing due to proprioception/visual spatial issues and cannot decipher right and left sensation. Working on focused and sustained attention, awareness, memory issues with min assist. Hoarseness with vocal cord paralysis. Maintained on a D3-thin liquid with provale' cup diet.  *See Care Plan and progress notes for long and short-term goals.   Revisions to Treatment Plan:  N/a  Teaching Needs: Safety, transfers, toileting, medications, dietary modifications, etc.  Current Barriers to Discharge: Decreased caregiver support and Home enviroment access/layout  Possible Resolutions to Barriers: Family education HH follow up services Ramp for entry to home, W/C     Medical Summary Current Status: HA requiring PRN meds and topamax, new LE DVTs with increased Eliquis dose  Barriers to Discharge: Pending chemo/radiation   Possible Resolutions to Barriers/Weekly Focus: med adjustment for HA, monitor LE DVT treatment   Continued Need for Acute Rehabilitation Level of Care: The patient requires daily medical management by a physician with specialized training in physical medicine and rehabilitation for the following reasons: Direction of a multidisciplinary physical rehabilitation program to maximize functional independence : Yes Medical management of patient stability for increased activity during participation in an intensive rehabilitation regime.: Yes Analysis of laboratory values and/or radiology reports with any subsequent need for medication adjustment and/or medical intervention. : Yes   I attest that I was present, lead the team conference, and concur with the assessment and plan of the team.   Dorien Chihuahua B 01/22/2023, 4:47 PM

## 2023-01-22 NOTE — Discharge Instructions (Addendum)
Inpatient Rehab Discharge Instructions  Patricia [name] Discharge date and time:  02/05/23  Activities/Precautions/ Functional Status: Activity: no lifting, driving, or strenuous exercise till cleared by MD Diet: cardiac diet Use provale cup as needed for drinking liquids Wound Care: none needed   Functional status:  ___ No restrictions      ___ Walk up steps independently __X_ 24/7 supervision/assistance   ___ Walk up steps with assistance ___ Intermittent supervision/assistance  ___ Bathe/dress independently ___ Walk with walker     __X_ Bathe/dress with assistance ___ Walk Independently     ___ Shower independently _X__ Walk with assistance    ___ Shower with assistance ___ No alcohol       ___ Return to work/school ________  COMMUNITY REFERRALS UPON DISCHARGE:    Home Health:   PT     OT     ST                  Agency: [facility]  Phone: [phone]    Medical Equipment/Items Ordered:Rolling Walker and Bedside Commode                                                   Agency/Supplier: [employer] [phone]   Special Instructions:  STROKE/TIA DISCHARGE INSTRUCTIONS SMOKING Cigarette smoking nearly doubles your risk of having a stroke & is the single most alterable risk factor  If you smoke or have smoked in the last 12 months, you are advised to quit smoking for your health. Most of the excess cardiovascular risk related to smoking disappears within a year of stopping. Ask you doctor about anti-smoking medications [place] Quit Line: 1-800-QUIT NOW Free Smoking Cessation Classes [phone]  CHOLESTEROL Know your levels; limit fat & cholesterol in your diet  Lipid Panel     Component Value Date/Time   CHOL 126 01/11/2023 0648   CHOL 198 08/02/2022 1112   TRIG 114 01/11/2023 0648   HDL 42 01/11/2023 0648   HDL 83 08/02/2022 1112   CHOLHDL 3.0 01/11/2023 0648   VLDL 23 01/11/2023 0648   LDLCALC 61 01/11/2023 0648   LDLCALC 101 (H) 08/02/2022 1112     Many patients  benefit from treatment even if their cholesterol is at goal. Goal: Total Cholesterol (CHOL) less than 160 Goal:  Triglycerides (TRIG) less than 150 Goal:  HDL greater than 40 Goal:  LDL (LDLCALC) less than 100   BLOOD PRESSURE American Stroke Association blood pressure target is less that 120/80 mm/Hg  Your discharge blood pressure is:  BP: 139/63 Monitor your blood pressure Limit your salt and alcohol intake Many individuals will require more than one medication for high blood pressure  DIABETES (A1c is a blood sugar average for last 3 months) Goal HGBA1c is under 7% (HBGA1c is blood sugar average for last 3 months)  Diabetes: No known diagnosis of diabetes    Lab Results  Component Value Date   HGBA1C 6.0 (H) 01/11/2023    Your HGBA1c can be lowered with medications, healthy diet, and exercise. Check your blood sugar as directed by your physician Call your physician if you experience unexplained or low blood sugars.  PHYSICAL ACTIVITY/REHABILITATION Goal is 30 minutes at least 4 days per week  Activity: No driving Therapies: see above Return to work: N/a Activity decreases your risk of heart attack and stroke and makes your  heart stronger.  It helps control your weight and blood pressure; helps you relax and can improve your mood. Participate in a regular exercise program. Talk with your doctor about the best form of exercise for you (dancing, walking, swimming, cycling).  DIET/WEIGHT Goal is to maintain a healthy weight  Your discharge diet is:  Diet Order             DIET DYS 3 Room service appropriate? Yes; Fluid consistency: Thin  Diet effective now                   liquids Your height is:  Height: 4' 9.99" (147.3 cm) Your current weight is: Weight: 63.1 kg Your Body Mass Index (BMI) is:  BMI (Calculated): 29.08 Following the type of diet specifically designed for you will help prevent another stroke. Your goal weight range is:   Your goal Body Mass Index (BMI) is  19-24. Healthy food habits can help reduce 3 risk factors for stroke:  High cholesterol, hypertension, and excess weight.  RESOURCES Stroke/Support Group:  Call [phone]   STROKE EDUCATION PROVIDED/REVIEWED AND GIVEN TO PATIENT Stroke warning signs and symptoms How to activate emergency medical system (call 911). Medications prescribed at discharge. Need for follow-up after discharge. Personal risk factors for stroke. Pneumonia vaccine given:  Flu vaccine given:  My questions have been answered, the writing is legible, and I understand these instructions.  I will adhere to these goals & educational materials that have been provided to me after my discharge from the hospital.     My questions have been answered and I understand these instructions. I will adhere to these goals and the provided educational materials after my discharge from the hospital.  Patient/Caregiver Signature _______________________________ Date __________  Clinician Signature _______________________________________ Date __________  Please bring this form and your medication list with you to all your follow-up doctor's appointments. Information on my medicine - ELIQUIS (apixaban)  This medication education was reviewed with me or my healthcare representative as part of my discharge preparation.   Why was Eliquis prescribed for you? Eliquis was prescribed to treat blood clots that may have been found in the veins of your legs (deep vein thrombosis) or in your lungs (pulmonary embolism) and to reduce the risk of them occurring again.  What do You need to know about Eliquis ? The starting dose is 10 mg (two 5 mg tablets) taken TWICE daily for the FIRST SEVEN (7) DAYS, then on January 28, 2023 the dose is reduced to ONE 5 mg tablet taken TWICE daily.  Eliquis may be taken with or without food.   Try to take the dose about the same time in the morning and in the evening. If you have difficulty swallowing the  tablet whole please discuss with your pharmacist how to take the medication safely.  Take Eliquis exactly as prescribed and DO NOT stop taking Eliquis without talking to the doctor who prescribed the medication.  Stopping may increase your risk of developing a new blood clot.  Refill your prescription before you run out.  After discharge, you should have regular check-up appointments with your healthcare provider that is prescribing your Eliquis.    What do you do if you miss a dose? If a dose of ELIQUIS is not taken at the scheduled time, take it as soon as possible on the same day and twice-daily administration should be resumed. The dose should not be doubled to make up for a missed dose.  Important  Safety Information A possible side effect of Eliquis is bleeding. You should call your healthcare provider right away if you experience any of the following: Bleeding from an injury or your nose that does not stop. Unusual colored urine (red or dark brown) or unusual colored stools (red or black). Unusual bruising for unknown reasons. A serious fall or if you hit your head (even if there is no bleeding).  Some medicines may interact with Eliquis and might increase your risk of bleeding or clotting while on Eliquis. To help avoid this, consult your healthcare provider or pharmacist prior to using any new prescription or non-prescription medications, including herbals, vitamins, non-steroidal anti-inflammatory drugs (NSAIDs) and supplements.  This website has more information on Eliquis (apixaban): http://[email or web address].com/eliquis/home

## 2023-01-22 NOTE — Progress Notes (Signed)
Speech Language Pathology Daily Session Note  Patient Details  Name: Patricia [name] MRN: [id number] Date of Birth: 09/29/1946  Today's Date: 01/22/2023 SLP Individual Time: 1314-3888 SLP Individual Time Calculation (min): 40 min  Short Term Goals: Week 1: SLP Short Term Goal 1 (Week 1): Pt will demonstrate increased error awareness during therapeutic task with minA verbal cues. SLP Short Term Goal 2 (Week 1): Pt will solve complex problems with minA verbal cues. SLP Short Term Goal 3 (Week 1): Pt will recall new information with minA verbal cues or visual aids. SLP Short Term Goal 4 (Week 1): Pt will tolerate least restrictive diet using safe swallow strategies with sup.  Skilled Therapeutic Interventions: Skilled treatment session focused on dysphagia and cognitive goals. SLP facilitated session by providing skilled observation with breakfast meal of Dys. 3 textures with thin liquids via the provale cup. Patient with one overt coughing episode, suspect due to mixed consistencies (drinking with a full oral cavity). Min verbal cues were needed  throughout meal for attention to self-feeding due to verbosity. Recommend patient continue current diet with full supervision. Patient participated in a functional conversation regarding intellectual and emergent awareness with patient requiring Mod verbal cues due to being tangential and verbose. Patient left upright in bed with alarm on and all needs within reach. Continue with current plan of care.      Pain No/Denies Pain   Therapy/Group: Individual Therapy  Kaili Castille 01/22/2023, 12:45 PM

## 2023-01-22 NOTE — Progress Notes (Signed)
Patient ID: Patricia [name], female   DOB: 04/18/1946, 77 y.o.   MRN: [id number]  Team Conference Report to Patient/Family  Team Conference discussion was reviewed with the patient and caregiver, including goals, any changes in plan of care and target discharge date.  Patient and caregiver express understanding and are in agreement.  The patient has a target discharge date of 02/01/23.  Sw met with patient and spouse at bedside and provided team conference updates. Patient working on improving her appetite. Patient reports that she is still experiencing constipation and nausea. Spouse at bedside reports that they have 2 steps to enter and their grandson will be able to build a ramp before next Saturday. No additional questions or concerns.   Patricia [name] 01/22/2023, 1:47 PM

## 2023-01-22 NOTE — Progress Notes (Signed)
Occupational Therapy Session Note  Patient Details  Name: Patricia [name] MRN: [id number] Date of Birth: Feb 13, 1946  Today's Date: 01/22/2023 OT Individual Time: 8563-1497 OT Individual Time Calculation (min): 72 min   Short Term Goals: Week 1:  OT Short Term Goal 1 (Week 1): The pt will transfer to all surfaces with MinA at 95% safe OT Short Term Goal 2 (Week 1): The pt will complete LB dressing with MinA at 95% safe OT Short Term Goal 3 (Week 1): The pt will tolerate >45 minutes of activity with minimal rest breaks @ 95% safe OT Short Term Goal 4 (Week 1): The pt will complete toileting with MinA by incorporating the grab bars  @ 95% safe  Skilled Therapeutic Interventions/Progress Updates:     Pt received semi-reclined in bed with husband, Patricia [name], present in room. Pt reporting 9/10 pain d/t headache- offered to contact Rn for pain medications with Pt stating she received pain medications prior to session. Offered to stay in room for session to minimize sound and light to decrease symptoms of headache with Pt receptive. Pt also reporting she had felt nauseous today requesting to complete activities bed level or seated.   Focused beginning of session on further vision evaluation to identify deficits and impact on functional tasks. Pt reporting she is continuing to have double vision when looking at TV or clock in room, however diplopia disappears when wearing L eye occluded glasses. Educated Pt diplopia is common following CVA and often disappears with time. Current goal of vision intervention is to decrease diplopia symptoms through accommodations to increase participation. Assessed Pt visual pursuits, visual field, and convergence with Pt able to smoothly track stimulus in all field and notice stimuli with peripheral vision. No visual field deficit noted, however Pt unable to fully converge L eye during assessment. Provided Pt with handout with exercises for visual tracking, visual pursuits,  convergence, and saccades to increase functional vision for BADLs. Provided Pt demonstration with practice provided following including skilled feedback on technique to increase success. Pt receptive to completing vision exercises in between sessions.   Pt completed visual motor peg board task using medium sized pegs sitting EOB with lateral, superior, inferior, and cross body reaching incorporated into task for visual motor coordination, increased awareness to L side, dynamic sitting balance, and facilitation on weight bearing through BLEs. Pt able to reach for pegs without dysmetria. Undershooting, or overshooting noted with Pt reporting no presence of double vision. Mod cues required for attention as Pt frequently stopped task to speak to therapist.   Pt completed seated therband exercises using Red therband to increase LUE strength and awareness. Pt completed 1x10 reps of chest pulls, lat pull down therapist anchors, and diagonal shoulder pulls with skilled feedback provided for position, technique, and utilizing breath during movement.   Pt noted to cough during session when speaking without taking breaks in conversation with mod cues required to instruct Pt to stop and swallow prior to carrying on conversation.  Pt returned to bed min A to lift LLE. Pt able to utilize bed features and complete bridges x3 to pull self up in bed with mod cueing for technique. Pt was left resting in bed with call bell in reach, bed alarm on, and all needs met.    Therapy Documentation Precautions:  Precautions Precautions: Fall, Other (comment) Precaution Comments: L hemiparesis, L lean Restrictions Weight Bearing Restrictions: No General:   Vital Signs: Therapy Vitals Temp: 98.2 F (36.8 C) Pulse Rate: 75 Resp: 18  BP: 129/70 Patient Position (if appropriate): Lying Oxygen Therapy SpO2: 97 % O2 Device: Room Air Pain: Pain Assessment Pain Score: Asleep  Therapy/Group: Individual Therapy  [name] 01/22/2023, 3:20 PM

## 2023-01-22 NOTE — Progress Notes (Addendum)
PROGRESS NOTE   Subjective/Complaints:  No new issues using fioricet for HA 2 tabs ~TID, left calf pain is more proximal today , no new swelling in LEs, no RLE pain  ROS: +LLE pain with standing, +constipation, +cough-better. Denies fevers, chills, CP, SOB, abd pain, N/V/D,   Objective:   VAS [name] LOWER EXTREMITY VENOUS (DVT)  Result Date: 01/21/2023  Lower Venous DVT Study Patient Name:  Patricia [name]  Date of Exam:   01/20/2023 Medical Rec #: [id number]          Accession #:    [id number] Date of Birth: [birth year]           Patient Gender: F Patient Age:   77 years Exam Location:  [facility] Procedure:      VAS [name] LOWER EXTREMITY VENOUS (DVT) Referring Phys: PAMELA LOVE --------------------------------------------------------------------------------  Indications: Pain.  Risk Factors: Immobility. Comparison Study: No prior study on file Performing Technologist: Sharion Dove RVS  Examination Guidelines: A complete evaluation includes B-mode imaging, spectral Doppler, color Doppler, and power Doppler as needed of all accessible portions of each vessel. Bilateral testing is considered an integral part of a complete examination. Limited examinations for reoccurring indications may be performed as noted. The reflux portion of the exam is performed with the patient in reverse Trendelenburg.  +---------+---------------+---------+-----------+----------+--------------+ RIGHT    CompressibilityPhasicitySpontaneityPropertiesThrombus Aging +---------+---------------+---------+-----------+----------+--------------+ CFV      Full           Yes      Yes                                 +---------+---------------+---------+-----------+----------+--------------+ SFJ      Full                                                        +---------+---------------+---------+-----------+----------+--------------+ FV Prox  Full                                                         +---------+---------------+---------+-----------+----------+--------------+ FV Mid   Full                                                        +---------+---------------+---------+-----------+----------+--------------+ FV DistalNone                                                        +---------+---------------+---------+-----------+----------+--------------+ PFV      Full                                                        +---------+---------------+---------+-----------+----------+--------------+  POP      None           Yes      Yes                                 +---------+---------------+---------+-----------+----------+--------------+ PTV      None                                         Acute          +---------+---------------+---------+-----------+----------+--------------+ PERO     None                                         Acute          +---------+---------------+---------+-----------+----------+--------------+   +---------+---------------+---------+-----------+----------+--------------+ LEFT     CompressibilityPhasicitySpontaneityPropertiesThrombus Aging +---------+---------------+---------+-----------+----------+--------------+ CFV      Full           Yes      Yes                                 +---------+---------------+---------+-----------+----------+--------------+ SFJ      Full                                                        +---------+---------------+---------+-----------+----------+--------------+ FV Prox  Full                                                        +---------+---------------+---------+-----------+----------+--------------+ FV Mid   Full                                                        +---------+---------------+---------+-----------+----------+--------------+ FV DistalNone                                         Acute           +---------+---------------+---------+-----------+----------+--------------+ PFV      Full                                                        +---------+---------------+---------+-----------+----------+--------------+ POP      Full           Yes      Yes                                 +---------+---------------+---------+-----------+----------+--------------+ PTV  None                                         Acute          +---------+---------------+---------+-----------+----------+--------------+ PERO     None                                         Acute          +---------+---------------+---------+-----------+----------+--------------+ Gastroc  Full                                                        +---------+---------------+---------+-----------+----------+--------------+     Summary: RIGHT: - Findings consistent with acute deep vein thrombosis involving the right popliteal vein, right posterior tibial veins, right peroneal veins, and distal right femoral vein.  LEFT: - Findings consistent with acute deep vein thrombosis involving the left posterior tibial veins, and left peroneal veins.  *See table(s) above for measurements and observations. Electronically signed by Orlie Pollen on 01/21/2023 at 12:09:25 PM.    Final    DG Chest 2 View  Result Date: 01/20/2023 CLINICAL DATA:  Cough EXAM: CHEST - 2 VIEW COMPARISON:  01/17/2023 FINDINGS: Low lung volumes. No acute airspace disease or pleural effusion. Normal cardiomediastinal silhouette. Aortic atherosclerosis. No pneumothorax. IMPRESSION: No active cardiopulmonary disease. Low lung volumes. Electronically Signed   By: Donavan Foil M.D.   On: 01/20/2023 20:16   Recent Labs    01/20/23 0838  WBC 4.9  HGB 10.7*  HCT 33.9*  PLT 275    Recent Labs    01/20/23 0838  NA 136  K 3.4*  CL 106  CO2 23  GLUCOSE 111*  BUN 12  CREATININE 0.86  CALCIUM 9.1     Intake/Output Summary (Last 24 hours) at  01/22/2023 0945 Last data filed at 01/22/2023 0811 Gross per 24 hour  Intake 594 ml  Output --  Net 594 ml         Physical Exam: Vital Signs Blood pressure (!) 150/78, pulse 76, temperature 97.7 F (36.5 C), temperature source Oral, resp. rate 17, height 4' 9.99" (1.473 m), weight 64.5 kg, SpO2 99 %.   General: No acute distress Mood and affect are appropriate Heart: Regular rate and rhythm no rubs murmurs or extra sounds Lungs: Clear to auscultation, breathing unlabored, no rales or wheezes Abdomen: Positive bowel sounds, soft nontender to palpation, nondistended Extremities: No clubbing, cyanosis, or edema, moderate left and mild right popliteal tenderness , neg Homan bilateral  Skin: No evidence of breakdown, no evidence of rash  Prior exam: MSK:      No apparent deformity.      Strength:                RUE: 5/5 SA, 5/5 EF, 5/5 EE, 5/5 WE, 5/5 FF, 5/5 FA                 LUE: 4/5 SA, 4/5 EF, 4/5 EE, 4/5 WE, 4/5 FF, 4/5 FA                 RLE: 5/5 HF, 5/5 KE, 5/5 DF, 5/5  EHL, 5/5 PF                 LLE:  4/5 HF, 4/5 KE, 4/5 DF, 4/5 EHL, 4/5 PF    Neurologic exam:  Cognition: AAO to person, place, time and event.  Language: Fluent, No substitutions or neoglisms. +Hoarse, breathy vocal quality. Names 3/3 objects correctly.   Mood: Pleasant affect, appropriate mood.  Sensation: To light touch intact in BL UEs and LEs   Coordination: No apparent tremors. No ataxia on FTN, HTS bilaterally.  Spasticity: MAS 0 in all extremities.   Assessment/Plan: 1. Functional deficits which require 3+ hours per day of interdisciplinary therapy in a comprehensive inpatient rehab setting. Physiatrist is providing close team supervision and 24 hour management of active medical problems listed below. Physiatrist and rehab team continue to assess barriers to discharge/monitor patient progress toward functional and medical goals  Care Tool:  Bathing    Body parts bathed by patient: Right  arm, Left arm, Chest, Abdomen, Front perineal area, Buttocks, Right upper leg, Left upper leg, Face   Body parts bathed by helper: Left lower leg, Right lower leg     Bathing assist Assist Level: Minimal Assistance - Patient > 75%     Upper Body Dressing/Undressing Upper body dressing   What is the patient wearing?: Pull over shirt    Upper body assist Assist Level: Set up assist    Lower Body Dressing/Undressing Lower body dressing      What is the patient wearing?: Pants, Incontinence brief     Lower body assist Assist for lower body dressing: Maximal Assistance - Patient 25 - 49%     Toileting Toileting    Toileting assist Assist for toileting: Minimal Assistance - Patient > 75%     Transfers Chair/bed transfer  Transfers assist     Chair/bed transfer assist level: Moderate Assistance - Patient 50 - 74%     Locomotion Ambulation   Ambulation assist      Assist level: Moderate Assistance - Patient 50 - 74% Assistive device: Hand held assist Max distance: 72ft   Walk 10 feet activity   Assist     Assist level: 2 helpers Assistive device: Hand held assist   Walk 50 feet activity   Assist Walk 50 feet with 2 turns activity did not occur: Safety/medical concerns         Walk 150 feet activity   Assist Walk 150 feet activity did not occur: Safety/medical concerns         Walk 10 feet on uneven surface  activity   Assist Walk 10 feet on uneven surfaces activity did not occur: Safety/medical concerns         Wheelchair     Assist Is the patient using a wheelchair?: Yes Type of Wheelchair: Manual    Wheelchair assist level: Moderate Assistance - Patient 50 - 74% Max wheelchair distance: 150'    Wheelchair 50 feet with 2 turns activity    Assist        Assist Level: Moderate Assistance - Patient 50 - 74%   Wheelchair 150 feet activity     Assist      Assist Level: Moderate Assistance - Patient 50 - 74%    Blood pressure (!) 150/78, pulse 76, temperature 97.7 F (36.5 C), temperature source Oral, resp. rate 17, height 4' 9.99" (1.473 m), weight 64.5 kg, SpO2 99 %.  Medical Problem List and Plan: 1. Functional deficits secondary to Bilateral cerebral CVAs with L  hemiparesis; embolic vs. metastatic             -patient may shower             -ELOS/Goals: 12-14 days, SPV PT/OT/SLP goals  -Continue CIR 2. Antithrombotics: -DVT/anticoagulation:  Pharmaceutical: Eliquis 5 mg BID -antiplatelet therapy: N/A - was on ASA and Plavix prior to stroke, now on Eliquis only, will be on 10mg  BID x7d then resume 5mg , Dr Julien Nordmann in agreement   3. LBP/Pain Management: Will continue tylenol 650mg  q6h PRN for now.  --May need something stronger for HA. Reports back pain better since chemo.   --Tylenol, flexeril 5mg  TID PRN, fioricet 2 tabs q6h PRN, Lidoderm patch QD   4. Mood/Behavior/Sleep: LCSW to follow for evaluation and support.                       --Melatonin 6mg  QHS PRN, Trazodone prn for insomnia.        -antipsychotic agents: N/A 5. Neuropsych/cognition: This patient is capable of making decisions on her own behalf. 6. Skin/Wound Care: Routine pressure relief measures.   7. Fluids/Electrolytes/Nutrition: Monitor I/O. Monitor weekly labs starting 01/20/23 -01/18/23 CMP unremarkable, monitor on weekly labs   8. HTN: Monitor BP TID--continue to hold Valsartan and HCTZ.              --SBP trending 100- 140 range. Avoid hypoperfusion   -01/19/23 BPs stable, cont to monitor Vitals:   01/18/23 1950 01/19/23 0248 01/19/23 1453 01/19/23 2005  BP: 115/63 111/62 (!) 118/90 111/74   01/20/23 0644 01/20/23 1408 01/20/23 2023 01/21/23 0259  BP: 128/73 129/79 (!) 141/76 (!) 149/77   01/21/23 1329 01/21/23 1950 01/22/23 0325 01/22/23 0325  BP: 108/73 118/71 (!) 150/78 (!) 150/78      9. Stage IV  NSCLC w/lymphadenopathy/muscular nodules: Chemo to resume after discharge. --Per neurology, repeat MRI brain  w/ and w/o in 6-8 weeks which would be ` last 2 weeks of March to continue to monitor for potential metastatic disease. Will need neuro outpt f/u 2-4 wks post discharge  10. GERD s/p esophageal dilatation: Continue Protonix 40mg  BID. Will add pepcid 20mg  BID also.  11. Anxiety/depression: On Paxil 40mg  daily w/xanax 0.5mg  QHS prn.   12. Hyperlipidemia: On Crestor 20mg  QD  13. Neck pain/Headaches: On PRN Fioricet 2 tabs Q6H, using consistently.  Will add Topamax 25 mg bid.              --lidocaine patch for local measures.              -- Flexeril 5 mg TID PRN for muscle tightness in neck -- bilateral occipital nerve blocks may be helpful if persistent, however in setting of metastic disease with multiple SQ nodules will hold off  -01/19/23 doing some better, cont regimen and monitor  14. Horseness/L-VC paralysis : Has been ongoing since August-->a little worse.               -- SLP for swallow, speech              -- ISB   15. Dizziness/nausea/double vision: R-beating nystagmus on exam. Vestibular eval and treat.  -01/18/23 was getting Zofran inpatient, ordered 4mg  ODT q8h PRN n/v; also has Compazine PRN  16. Persistent cough since CVA. Causing cervicalgia, HA's, and nausea/vomiting with PO intakes.              --Treat cough -rule out aspiration/post obstructive infection.                --  Duo nebs qid added w/tessalon perles prn --may need pulmonary involved if no better.  -01/18/23 CXR yesterday without active disease; seems better today, though poor inspiratory effort. Continue Duonebs, Tessalon 200mg  TID PRN, Robitussin PRN, and monitor  -01/19/23 lungs sound ok today, coughing spells seems to be with PO intake, monitor for now but no acute change needed today  17. Anemia: Hgb down to 10.5 on 01/10/23, stable at 10.7 on 01/18/23; monitor on weekly labs starting 5/85/27  -Continue Folic acid 1mg  QD, multivitamins  18. Hx of rectal surgery, chronic constipation:  -01/18/23 LBM yesterday,  but requests stool softeners given hx of rectal surgery. Ordered Miralax 17g QD + PRN, Colace 100mg  QD + PRN. Has Dulcolax supp PRN, Fleet's PRN.  -01/19/23 no BM in 2 days, doesn't like miralax; change miralax to PRN only, increase Colace to 100mg  BID+PRN, add Senna 8.6mg  QD Give sorbitol x 1 19. LLE pain: nursing reporting this after assessment this morning 01/18/23, per PT reports-- PT concerned for DVT, no significant swelling noted on exam this morning -01/18/23 ordered doppler U/S bilateral LEs to r/o DVT given hx of malignancy, continue above pain meds + DVT , increase Eliquis to 10mg  BID x 7d then resume 5mg  BID- may cont therapy     LOS: 5 days A FACE TO FACE EVALUATION WAS PERFORMED  Charlett [name] 01/22/2023, 9:45 AM

## 2023-01-23 ENCOUNTER — Inpatient Hospital Stay: Payer: Medicare HMO | Admitting: Internal Medicine

## 2023-01-23 ENCOUNTER — Inpatient Hospital Stay: Payer: Medicare HMO

## 2023-01-23 DIAGNOSIS — I63133 Cerebral infarction due to embolism of bilateral carotid arteries: Secondary | ICD-10-CM | POA: Diagnosis not present

## 2023-01-23 NOTE — Progress Notes (Addendum)
Speech Language Pathology Weekly Progress and Session Note  Patient Details  Name: Patricia [name] MRN: [id number] Date of Birth: Aug 16, 1946  Beginning of progress report period: January 18, 2023 End of progress report period: January 23, 2023  Today's Date: 01/23/2023 SLP Individual Time: 0800-0830 SLP Individual Time Calculation (min): 30 min  Short Term Goals: Week 1: SLP Short Term Goal 1 (Week 1): Pt will demonstrate increased error awareness during therapeutic task with minA verbal cues. SLP Short Term Goal 1 - Progress (Week 1): Met SLP Short Term Goal 2 (Week 1): Pt will solve complex problems with minA verbal cues. SLP Short Term Goal 2 - Progress (Week 1): Other (comment) (not formally addressed this reporting period) SLP Short Term Goal 3 (Week 1): Pt will recall new information with minA verbal cues or visual aids. SLP Short Term Goal 3 - Progress (Week 1): Met SLP Short Term Goal 4 (Week 1): Pt will tolerate least restrictive diet using safe swallow strategies with sup. SLP Short Term Goal 4 - Progress (Week 1): Met  New Short Term Goals: Week 2: SLP Short Term Goal 1 (Week 2): STG=LTG due to ELOS  Weekly Progress Updates: Patient has made functional gains and has met 3 of 4 STGs this reporting period. Patient is currently completing cognitive tasks with overall min-to-mod A verbal cues to complete functional and complex tasks accurately and safely. Pt is currently consuming a dysphagia 3 diet with thin liquids with use of 5cc Provale cup at supervision level for implementation of swallowing safety precautions. Patient and family education is ongoing. Patient would benefit from continued skilled SLP intervention to maximize cognitive and swallowing functioning and overall functional independence prior to discharge.   Intensity: Minumum of 1-2 x/day, 30 to 90 minutes Frequency: 3 to 5 out of 7 days Duration/Length of Stay: 2/24 Treatment/Interventions: Cognitive  remediation/compensation;Environmental controls;Cueing hierarchy;Functional tasks;Therapeutic Activities;Internal/external aids;Therapeutic Exercise;Dysphagia/aspiration precaution training;Patient/family education  Daily Session Skilled Therapeutic Interventions: Skilled ST treatment focused on cognitive and swallowing goals. Pt was greeted semi-reclined in bed on arrival. Pt asked appropriate questions regarding swallow function and dysphonia. SLP reinforced education on dysphagia, 5cc provale cup, and optimal positioning for PO consumption. Pt consumed single sips of thin liquids via provale cup without overt s/sx of aspiration. Pt was also observed consuming dysphagia 3 textures and mixed consistencies with functional mastication, effective oral clearance, and without overt s/sx of aspiration. Continue with current diet as tolerated.   SLP provided education on dysphonia, vocal hygiene, and importance of hydration, as pt reported she often gets a "tickle" in her throat and c/o significant dryness. Pt reported she consumes approximately 16 fluid oz a day, "if that." SLP facilitated functional problem solving pertaining to communication with spouse who is very hard of hearing per pt report, including face-to-face communication, reducing environmental stimuli, and use of written communication as needed. Pt verbalized understanding through teach back.   Pt recalled recent events in therapy appropriately with awareness to deficits with min-to-mod A verbal cues. Pt benefited from intermittent verbal redirection cues for topic maintenance due to talkative nature. Patient was left in bed with alarm activated and immediate needs within reach at end of session. Continue per current plan of care.      General    Pain Pain Assessment Pain Scale: 0-10 Pain Score: 9  Pain Type: Acute pain Pain Location: Head Pain Descriptors / Indicators: Aching Pain Frequency: Constant Pain Onset: On-going Pain  Intervention(s): Medication (See eMAR)  Therapy/Group: Individual Therapy  Connelly Spruell  T Deforest Maiden 01/23/2023, 12:57 PM

## 2023-01-23 NOTE — Progress Notes (Signed)
Occupational Therapy Session Note  Patient Details  Name: Patricia [name] MRN: [id number] Date of Birth: October 27, 1946  Today's Date: 01/23/2023 OT Individual Time: 1102-1200 OT Individual Time Calculation (min): 58 min    Short Term Goals: Week 1:  OT Short Term Goal 1 (Week 1): The pt will transfer to all surfaces with MinA at 95% safe OT Short Term Goal 2 (Week 1): The pt will complete LB dressing with MinA at 95% safe OT Short Term Goal 3 (Week 1): The pt will tolerate >45 minutes of activity with minimal rest breaks @ 95% safe OT Short Term Goal 4 (Week 1): The pt will complete toileting with MinA by incorporating the grab bars  @ 95% safe  Skilled Therapeutic Interventions/Progress Updates:     Pt received using restroom with nursing staff present in room. Pt handed off to OT with pt reporting mild head ache, however no other pain at this time- OT offering repositioning, rest breaks, and changes in environment decrease pain. Pt receptive to skilled OT session. Pt provided increased time on toilet d/t need for Bm (small +B and B with nursing staff present). Pt able to complete anterior/posterior peri care with supervision and bring pants to waist min A.   Pt completed functional mobility training in room with mod HHA with verbal and tactile cues required for step length and leg positioning. Pt presenting with R/L discrimination challenges. PT reporting prior to session discussing naming R/L side to decrease challenges discerning between R/L (R-Patricia [name], L-Patricia [name]) with OT utilizing strategy during session to support carry over and consistency between therapy sessions. Pt completed hand washing in standing with min A for balance and cueing for body positioning.   Education provided on wc propulsion to increase independence in functional mobility upon d/c home with demonstration and practice provided following. Pt able to propel wc from room to therapy gym ~50 feet with mod A for navigating around  obstacles and max cueing for technique.   Pt completed LB exercises in supine position to increase LB strength, control, and awareness for functional mobility/transfers. Skilled feedback and VB/tactile cueing provided during exercises for pacing, control, muscle engagement, and technique. Pt noted to attempt to overly utilize RLE during BLE exercises requiring mod cueing to attend and engage LLE in movement  -Hip Abduction with yellow (light resistance) 2x10 reps -Hip Adduction isometric holds with ball between knees 3x10 reps -Glute bridges with isometric hold with ball between knees 3x12 reps  Pt transitioned from supine to EOM with min A and mod cues for technique. Sit>stand min A with HHA. Ambulated to wc mod HHA with mod cueing for step length and L leg positioning. Pt transported back to room total A in wc. Pt was left resting in wc with call bell in reach, chair alarm on, husband and friend present in room, and all immediate needs met.   Therapy Documentation Precautions:  Precautions Precautions: Fall, Other (comment) Precaution Comments: L hemiparesis, L lean Restrictions Weight Bearing Restrictions: No General:   Vital Signs: Therapy Vitals Temp: 98.2 F (36.8 C) Temp Source: Oral Pulse Rate: 86 Resp: 17 BP: 133/68 Patient Position (if appropriate): Lying Oxygen Therapy SpO2: 93 % O2 Device: Room Air Pain: Pain Assessment Pain Scale: 0-10 Pain Score: 3  Pain Type: Acute pain Pain Location: Head Pain Descriptors / Indicators: Aching Pain Frequency: Constant Pain Onset: On-going Pain Intervention(s): Medication (See eMAR) ADL: ADL Equipment Provided: Reacher Eating: Not assessed (based on observation pt is s/u assist does present with  swallowing challenges encouraged to tuck chin to reduce incidence for adverse response.) Where Assessed-Eating: Wheelchair Grooming: Setup Where Assessed-Grooming: Sitting at sink Upper Body Bathing: Setup Where Assessed-Upper  Body Bathing: Sitting at sink Lower Body Bathing: Minimal assistance, Moderate assistance Where Assessed-Lower Body Bathing: Sitting at sink Upper Body Dressing: Setup Where Assessed-Upper Body Dressing: Wheelchair Lower Body Dressing: Minimal assistance, Moderate assistance Where Assessed-Lower Body Dressing: Wheelchair Toileting: Modified independent, Contact guard (secondary to challenges with transfer from w/c to commode needing additional time) Where Assessed-Toileting: Glass blower/designer: Moderate assistance, Minimal assistance (using grab bar and additional time.) Toilet Transfer Method: Stand pivot Toilet Transfer Equipment: Grab bars (grab bar and arm of w/c) Tub/Shower Transfer: Minimal assistance, Moderate assistance (based on observation of toilet transfer.) Social research officer, government: Not assessed   Therapy/Group: Individual Therapy  [name] 01/23/2023, 11:23 AM

## 2023-01-23 NOTE — Plan of Care (Signed)
  Problem: RH Balance Goal: LTG Patient will maintain dynamic standing balance (PT) Description: LTG:  Patient will maintain dynamic standing balance with assistance during mobility activities (PT) Flowsheets (Taken 01/23/2023 2156) LTG: Pt will maintain dynamic standing balance during mobility activities with:: (downgraded due to change in POC for shorter LOS) Minimal Assistance - Patient > 75% Note: downgraded due to change in POC for shorter LOS    Problem: Sit to Stand Goal: LTG:  Patient will perform sit to stand with assistance level (PT) Description: LTG:  Patient will perform sit to stand with assistance level (PT) Flowsheets (Taken 01/23/2023 2156) LTG: PT will perform sit to stand in preparation for functional mobility with assistance level: (downgraded due to change in POC for shorter LOS) Minimal Assistance - Patient > 75% Note: downgraded due to change in POC for shorter LOS    Problem: RH Bed Mobility Goal: LTG Patient will perform bed mobility with assist (PT) Description: LTG: Patient will perform bed mobility with assistance, with/without cues (PT). Flowsheets (Taken 01/23/2023 2156) LTG: Pt will perform bed mobility with assistance level of: (downgraded due to change in POC for shorter LOS) Contact Guard/Touching assist Note: downgraded due to change in POC for shorter LOS    Problem: RH Car Transfers Goal: LTG Patient will perform car transfers with assist (PT) Description: LTG: Patient will perform car transfers with assistance (PT). Flowsheets (Taken 01/23/2023 2156) LTG: Pt will perform car transfers with assist:: (downgraded due to change in POC for shorter LOS) Minimal Assistance - Patient > 75% Note: downgraded due to change in POC for shorter LOS    Problem: RH Stairs Goal: LTG Patient will ambulate up and down stairs w/assist (PT) Description: LTG: Patient will ambulate up and down # of stairs with assistance (PT) Flowsheets (Taken 01/23/2023 2156) LTG: Pt will  ambulate up/down stairs assist needed:: (downgraded due to change in POC for shorter LOS) Minimal Assistance - Patient > 75% LTG: Pt will  ambulate up and down number of stairs: 4 steps using HRs Note: downgraded due to change in POC for shorter LOS    Problem: RH Ambulation Goal: LTG Patient will ambulate in home environment (PT) Description: LTG: Patient will ambulate in home environment, # of feet with assistance (PT). Flowsheets (Taken 01/23/2023 2159) LTG: Pt will ambulate in home environ  assist needed:: (downgraded due to change in POC for shorter LOS) Minimal Assistance - Patient > 75% LTG: Ambulation distance in home environment: 7ft using LRAD Note: downgraded due to change in POC for shorter LOS    Problem: RH Ambulation Goal: LTG Patient will ambulate in controlled environment (PT) Description: LTG: Patient will ambulate in a controlled environment, # of feet with assistance (PT). 01/23/2023 2200 by Tawana Scale, PT Flowsheets (Taken 01/23/2023 2200) LTG: Pt will ambulate in controlled environ  assist needed:: (downgraded due to change in POC for shorter LOS) Minimal Assistance - Patient > 75% LTG: Ambulation distance in controlled environment: 154ft using LRAD Note: downgraded due to change in POC for shorter LOS  01/23/2023 2159 by Tawana Scale, PT Flowsheets (Taken 01/23/2023 2159) LTG: Pt will ambulate in controlled environ  assist needed:: Contact Guard/Touching assist

## 2023-01-23 NOTE — Progress Notes (Signed)
Physical Therapy Weekly Progress Note  Patient Details  Name: Patricia [name] MRN: [id number] Date of Birth: 1946-09-24  Beginning of progress report period: January 18, 2023 End of progress report period: January 23, 2023  Today's Date: 01/23/2023 PT Individual Time: 0907-1001 and 1425-1505 PT Individual Time Calculation (min): 54 min  and 40 min   Patient has met 2 of 5 short term goals. Patricia [name] is making steady progress with therapy demonstrating increasing independence with functional mobility. She is performing supine<>sit with CGA/min assist using bed features, sit<>stands and stand pivot transfers HHA with min/mod assist, and ambulating up to ~132ft using B HHA with mod assist for balance. She continues to demonstrate severe balance impairment with impaired awareness of when LOB is occurring (typically posteriorly) and severely delayed and inadequate balance recovery strategies displayed. She also continues to have L hemiparesis with impaired L LE gait mechanics resulting in L LOB when ambulating. Based on team discussion with family input, have decided to shorten her ELOS and downgrade her LTGs to a safe level while allowing her to D/C sooner and initiate her cancer treatments in a more timely manner. Family is planning to have ramp installed this weekend for home entry. Pending pt's progress she may or may not be able to safely ambulate with her husband at D/C. Pt will benefit from continued CIR level skilled physical therapy to further progress her independence with functional mobility prior to D/Cing home with 24hr support from family.   Patient continues to demonstrate the following deficits muscle weakness, decreased cardiorespiratoy endurance, impaired timing and sequencing, unbalanced muscle activation, and decreased motor planning,  , decreased midline orientation, decreased initiation, decreased attention, decreased awareness, decreased problem solving, decreased safety  awareness, decreased memory, and delayed processing, and decreased sitting balance, decreased standing balance, decreased postural control, hemiplegia, and decreased balance strategies and therefore will continue to benefit from skilled PT intervention to increase functional independence with mobility.  Patient not progressing toward long term goals.  See goal revision..  Plan of care revisions: shorter LOS.  PT Short Term Goals Week 1:  PT Short Term Goal 1 (Week 1): Pt will perform supine<>sit with min assist PT Short Term Goal 1 - Progress (Week 1): Met PT Short Term Goal 2 (Week 1): Pt will perform sit<>stands using LRAD with min assist PT Short Term Goal 2 - Progress (Week 1): Progressing toward goal PT Short Term Goal 3 (Week 1): Pt will perform bed<>chair transfers using LRAD with min assist PT Short Term Goal 3 - Progress (Week 1): Progressing toward goal PT Short Term Goal 4 (Week 1): Pt will ambulate at least 41ft using LRAD with mod assist of 1 (+2 w/c follow if needed) PT Short Term Goal 4 - Progress (Week 1): Met PT Short Term Goal 5 (Week 1): Pt will initiate stair navigation training PT Short Term Goal 5 - Progress (Week 1): Not met Week 2:  PT Short Term Goal 1 (Week 2): = to LTGs based on ELOS  Skilled Therapeutic Interventions/Progress Updates:  Ambulation/gait training;Community reintegration;DME/adaptive equipment instruction;Neuromuscular re-education;Psychosocial support;Stair training;UE/LE Strength taining/ROM;Wheelchair propulsion/positioning;Balance/vestibular training;Discharge planning;Functional electrical stimulation;Pain management;Skin care/wound management;Therapeutic Activities;UE/LE Coordination activities;Cognitive remediation/compensation;Disease management/prevention;Functional mobility training;Patient/family education;Splinting/orthotics;Therapeutic Exercise;Visual/perceptual remediation/compensation   Session 1: Pt received siting in w/c with NT present  and pt agreeable to therapy session. Pt agreeable to therapy session. Pt continues to be hyperverbal today requiring cuing to focus on the task at hand to ensure her safety. Transported to/from gym in w/c for time management  and energy conservation.  Sit>stand w/c>HHA with continued light mod assist for coming to stand and balance due to posterior lean bias - cuing to bring feet back and apart (tends to place L LE too far towards R and bring trunk forward).  Gait training 11ft + 22ft using BHHA from therapist (therapist guarding on L side) with mod assist for balance. Pt demonstrating the following gait deviations with therapist providing the described cuing and facilitation for improvement:  - continues to have increased postural sway in all directions, primarily posterior lean & L lateral lean - continues to have poor L LE foot clearance causing decreased step length further worsening L lean/LOB more so today than prior days; therapist intermittently manually facilitating improved R weight shift during R stance - takes excessively large R LE step length - hinges forward at hips with fatigue, cuing to improve upright posture and activate glutes - benefits from cuing to increase L LE step length but doesn't know L vs R (now calling R LE "Patricia [name]" and L LE "Patricia [name]")   Pt reports feeling need to void - transported back to her room.   Gait training in/out bathroom with B HHA as described above with mod assist for balance due to continued above gait deviations that are worsened in this dynamic home setting with smaller spaces and increased turning.   Standing using UE support on grab bar with min assist for balance during max/total assist LB clothing management - pt unable to void despite attempt.   Standing hand hygiene at sink with pt requiring max/total cuing to step over fully to sink prior to reaching to start washing her hands - pt has total L LOB when doing this showing decreased safety  awareness, impaired anticipatory awareness, and impaired problem solving.  Pt states "I feel awful..I got up and down all night.Marland KitchenMarland KitchenI'm give out." Pt requesting to return to bed. L stand pivot w/c>EOB using B HHA to promote increased upright posture and allow therapist to improve facilitation for weight shifting onto stance limb. Sit>supine with CGA/min assist as pt [name] down sprawled out on the bed. At end of session, pt left supine in bed with needs in reach and nurse coming to provide nausea medication.   Session 2: Pt received sitting in w/c with her husband, Patricia [name], present and pt agreeable to therapy session.  Therapist initiated verbal family education on the following:  - change in POC to allow pt to D/C sooner at a lower mobility level in order to allow her to initiate cancer treatment sooner, pt/family in agreement with this plan - due to the change in her POC, pt will need a wheelchair - recommending 16x16in hemi-height wheelchair with cushion, notified SW with plan for possible custom wheelchair consult due to pt's height/stature and unsure progression of mobility in future due to medical diagnoses - discussed need for hands-on family education/training prior to D/C and that pt may not be safe to ambulate with her husband's assistance due to concern for pt's LOB, they thought maybe their family member, [name], could learn how to safely provide physical assistance for pt to ambulate at home - therapist asked SW to follow-up and coordinate scheduling famed  - educated pt's husband on pt's impaired balance and severely delayed awareness of when LOB is occurring with inadequate balance recovery strategies as well as pt's impaired dual-task abilities requiring cuing to focus on the current task at hand   Donned shoes total assist for time management.  Transported to/from gym in  w/c for time management and energy conservation.  Sit>stands w/c>B HHA with mod assist primarily for balance to  facilitate anterior trunk weight shift due to strong posterior lean bias - continued cuing to bring trunk forward when coming to stand.  Gait training ~163ft using BHHA from therapist (guarding on L side) with mod assist for balance - progressed to only L HHA for final ~51ft of gait distance. Pt demonstrating the following gait deviations with therapist providing the described cuing and facilitation for improvement:  - continues to have excessively large R LE step length with poor R weight shift onto stance limb resulting in decreased L LE foot clearance and step length  - cuing to look up and maintain glute activation for upright posture - continues to lack R vs L discrimination with attempts at using code names but pt still struggling with differentiating them  Transported back towards room then performed gait training ~69ft to EOB using B HHA and mod assist as just described to demonstrate to pt's husband pt's CLOF.  Sit>supine with min assist for trunk descent and B LE management onto bed. Pt left supine in bed with needs in reach, bed alarm on, and pt's husband present.    Therapy Documentation Precautions:  Precautions Precautions: Fall, Other (comment) Precaution Comments: L hemiparesis, L lean Restrictions Weight Bearing Restrictions: No   Pain: Session 1: Towards end of session pt reports her headache pain is worsening and she starts to feel nauseous - nurse notified for medication administration - therapist assisted pt back to bed to rest at end of session.  Session 2: Continues to report 7/10 headache pain - nurse notified and present for medication administration.   Therapy/Group: Individual Therapy  Tawana Scale , PT, DPT, NCS, CSRS 01/23/2023, 7:52 AM

## 2023-01-23 NOTE — Progress Notes (Signed)
Patient endorsing headache. Endorsed headache upon waking this morning. However, as the early morning progressed headache increased to a "9". In addition to, with increase pain associated with headache diastolic decreased with blood pressure.

## 2023-01-23 NOTE — Progress Notes (Signed)
PROGRESS NOTE   Subjective/Complaints:  HA pain earlier today , discussed prophyllactic med topiramate and prn med fioricet with goal of no longer using PRN med Discussed D/C date as well as need to resume chemo  ROS: +LLE pain with standing, +constipation, +cough-better. Denies fevers, chills, CP, SOB, abd pain, N/V/D,   Objective:   No results found. No results for input(s): "WBC", "HGB", "HCT", "PLT" in the last 72 hours.  No results for input(s): "NA", "K", "CL", "CO2", "GLUCOSE", "BUN", "CREATININE", "CALCIUM" in the last 72 hours.   Intake/Output Summary (Last 24 hours) at 01/23/2023 0915 Last data filed at 01/23/2023 0700 Gross per 24 hour  Intake 420 ml  Output --  Net 420 ml         Physical Exam: Vital Signs Blood pressure (!) 141/68, pulse 76, temperature 98.4 F (36.9 C), temperature source Oral, resp. rate 16, height 4' 9.99" (1.473 m), weight 62.1 kg, SpO2 96 %.   General: No acute distress Mood and affect are appropriate Heart: Regular rate and rhythm no rubs murmurs or extra sounds Lungs: Clear to auscultation, breathing unlabored, no rales or wheezes Abdomen: Positive bowel sounds, soft nontender to palpation, nondistended Extremities: No clubbing, cyanosis, or edema, moderate left and mild right popliteal tenderness , neg Homan bilateral  Skin: No evidence of breakdown, no evidence of rash  Prior exam: MSK:      No apparent deformity.      Strength:                RUE: 5/5 SA, 5/5 EF, 5/5 EE, 5/5 WE, 5/5 FF, 5/5 FA                 LUE: 4/5 SA, 4/5 EF, 4/5 EE, 4/5 WE, 4/5 FF, 4/5 FA                 RLE: 5/5 HF, 5/5 KE, 5/5 DF, 5/5 EHL, 5/5 PF                 LLE:  4/5 HF, 4/5 KE, 4/5 DF, 4/5 EHL, 4/5 PF    Neurologic exam:  Cognition: AAO to person, place, time and event.  Language: Fluent, No substitutions or neoglisms. +Hoarse, breathy vocal quality. Names 3/3 objects correctly.   Mood:  Pleasant affect, appropriate mood.  Sensation: To light touch intact in BL UEs and LEs   Coordination: No apparent tremors. No ataxia on FTN, HTS bilaterally.  Spasticity: MAS 0 in all extremities.   Assessment/Plan: 1. Functional deficits which require 3+ hours per day of interdisciplinary therapy in a comprehensive inpatient rehab setting. Physiatrist is providing close team supervision and 24 hour management of active medical problems listed below. Physiatrist and rehab team continue to assess barriers to discharge/monitor patient progress toward functional and medical goals  Care Tool:  Bathing    Body parts bathed by patient: Right arm, Left arm, Chest, Abdomen, Front perineal area, Buttocks, Right upper leg, Left upper leg, Face   Body parts bathed by helper: Left lower leg, Right lower leg     Bathing assist Assist Level: Minimal Assistance - Patient > 75%     Upper Body  Dressing/Undressing Upper body dressing   What is the patient wearing?: Pull over shirt    Upper body assist Assist Level: Set up assist    Lower Body Dressing/Undressing Lower body dressing      What is the patient wearing?: Pants, Incontinence brief     Lower body assist Assist for lower body dressing: Maximal Assistance - Patient 25 - 49%     Toileting Toileting    Toileting assist Assist for toileting: Minimal Assistance - Patient > 75%     Transfers Chair/bed transfer  Transfers assist     Chair/bed transfer assist level: Moderate Assistance - Patient 50 - 74%     Locomotion Ambulation   Ambulation assist      Assist level: Moderate Assistance - Patient 50 - 74% Assistive device: Hand held assist Max distance: 32ft   Walk 10 feet activity   Assist     Assist level: 2 helpers Assistive device: Hand held assist   Walk 50 feet activity   Assist Walk 50 feet with 2 turns activity did not occur: Safety/medical concerns         Walk 150 feet  activity   Assist Walk 150 feet activity did not occur: Safety/medical concerns         Walk 10 feet on uneven surface  activity   Assist Walk 10 feet on uneven surfaces activity did not occur: Safety/medical concerns         Wheelchair     Assist Is the patient using a wheelchair?: Yes Type of Wheelchair: Manual    Wheelchair assist level: Moderate Assistance - Patient 50 - 74% Max wheelchair distance: 150'    Wheelchair 50 feet with 2 turns activity    Assist        Assist Level: Moderate Assistance - Patient 50 - 74%   Wheelchair 150 feet activity     Assist      Assist Level: Moderate Assistance - Patient 50 - 74%   Blood pressure (!) 141/68, pulse 76, temperature 98.4 F (36.9 C), temperature source Oral, resp. rate 16, height 4' 9.99" (1.473 m), weight 62.1 kg, SpO2 96 %.  Medical Problem List and Plan: 1. 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 metastatic             -patient may shower             -ELOS/Goals: 12-14 days, SPV PT/OT/SLP goals  -Continue CIR 2. Antithrombotics: -DVT/anticoagulation:  Pharmaceutical: Eliquis 5 mg BID -antiplatelet therapy: N/A - was on ASA and Plavix prior to stroke, now on Eliquis only, will be on 10mg  BID x7d then resume 5mg , Dr Julien Nordmann in agreement   3. LBP/Pain Management: Will continue tylenol 650mg  q6h PRN for now.  --May need something stronger for HA. Reports back pain better since chemo.   --Tylenol, flexeril 5mg  TID PRN, fioricet 2 tabs q6h PRN, Lidoderm patch QD   4. Mood/Behavior/Sleep: LCSW to follow for evaluation and support.                       --Melatonin 6mg  QHS PRN, Trazodone prn for insomnia.        -antipsychotic agents: N/A 5. Neuropsych/cognition: This patient is capable of making decisions on her own behalf. 6. Skin/Wound Care: Routine pressure relief measures.   7. Fluids/Electrolytes/Nutrition: Monitor I/O. Monitor weekly labs  starting 01/20/23 -01/18/23 CMP unremarkable, monitor on weekly labs   8. HTN: Monitor BP  TID--continue to hold Valsartan and HCTZ.              --SBP trending 100- 140 range. Avoid hypoperfusion   -01/19/23 BPs stable, cont to monitor Vitals:   01/20/23 2023 01/21/23 0259 01/21/23 1329 01/21/23 1950  BP: (!) 141/76 (!) 149/77 108/73 118/71   01/22/23 0325 01/22/23 0325 01/22/23 1040 01/22/23 1429  BP: (!) 150/78 (!) 150/78 126/73 129/70   01/22/23 1919 01/22/23 2203 01/23/23 0517 01/23/23 0652  BP: (!) 139/91 (!) 137/58 (!) 149/75 (!) 141/68      9. Stage IV  NSCLC w/lymphadenopathy/muscular nodules: Chemo to resume after discharge. --Per neurology, repeat MRI brain w/ and w/o in 6-8 weeks which would be ` last 2 weeks of March to continue to monitor for potential metastatic disease. Will need neuro outpt f/u 2-4 wks post discharge  10. GERD s/p esophageal dilatation: Continue Protonix 40mg  BID. Will add pepcid 20mg  BID also.  11. Anxiety/depression: On Paxil 40mg  daily w/xanax 0.5mg  QHS prn.   12. Hyperlipidemia: On Crestor 20mg  QD  13. Neck pain/Headaches: On PRN Fioricet 2 tabs Q6H, using consistently.  Will add Topamax 25 mg bid.              --lidocaine patch for local measures.              -- Flexeril 5 mg TID PRN for muscle tightness in neck --increased topamax to 50mg  BID- reassess for dose  increase in am    14. Horseness/L-VC paralysis : Has been ongoing since August-->a little worse.               -- SLP for swallow, speech              -- ISB   15. Dizziness/nausea/double vision: R-beating nystagmus on exam. Vestibular eval and treat.  -01/18/23 was getting Zofran inpatient, ordered 4mg  ODT q8h PRN n/v; also has Compazine PRN  16. Persistent cough since CVA. Causing cervicalgia, HA's, and nausea/vomiting with PO intakes.              --Treat cough -rule out aspiration/post obstructive infection.                -- Duo nebs qid added w/tessalon perles prn --may need  pulmonary involved if no better.  -01/18/23 CXR yesterday without active disease; seems better today, though poor inspiratory effort. Continue Duonebs, Tessalon 200mg  TID PRN, Robitussin PRN, and monitor  -01/19/23 lungs sound ok today, coughing spells seems to be with PO intake, monitor for now but no acute change needed today  17. Anemia: Hgb down to 10.5 on 01/10/23, stable at 10.7 on 01/18/23; monitor on weekly labs starting 9/41/74  -Continue Folic acid 1mg  QD, multivitamins  18. Hx of rectal surgery, chronic constipation:  -01/18/23 LBM yesterday, but requests stool softeners given hx of rectal surgery. Ordered Miralax 17g QD + PRN, Colace 100mg  QD + PRN. Has Dulcolax supp PRN, Fleet's PRN.  -01/19/23 no BM in 2 days, doesn't like miralax; change miralax to PRN only, increase Colace to 100mg  BID+PRN, add Senna 8.6mg  QD Give sorbitol x 1 19. LLE pain: nursing reporting this after assessment this morning 01/18/23, per PT reports-- PT concerned for DVT, no significant swelling noted on exam this morning -01/18/23 ordered doppler U/S bilateral LEs to r/o DVT given hx of malignancy, continue above pain meds + DVT , increase Eliquis to 10mg  BID x 7d then resume 5mg  BID- may cont therapy     LOS:  6 days A FACE TO FACE EVALUATION WAS PERFORMED  Charlett [name] 01/23/2023, 9:15 AM

## 2023-01-24 MED ORDER — MUSCLE RUB 10-15 % EX CREA
TOPICAL_CREAM | CUTANEOUS | Status: DC | PRN
  Filled 2023-01-24: qty 85

## 2023-01-24 NOTE — Progress Notes (Addendum)
PROGRESS NOTE   Subjective/Complaints:  No issues overnite , still needing assist with ADLs, L calf pain after therapy   ROS: +LLE pain with standing, +constipation, +cough-better. Denies fevers, chills, CP, SOB, abd pain, N/V/D,   Objective:   No results found. No results for input(s): "WBC", "HGB", "HCT", "PLT" in the last 72 hours.  No results for input(s): "NA", "K", "CL", "CO2", "GLUCOSE", "BUN", "CREATININE", "CALCIUM" in the last 72 hours.   Intake/Output Summary (Last 24 hours) at 01/24/2023 0808 Last data filed at 01/23/2023 1500 Gross per 24 hour  Intake 160 ml  Output --  Net 160 ml         Physical Exam: Vital Signs Blood pressure (!) 142/75, pulse 90, temperature 98 F (36.7 C), temperature source Oral, resp. rate 18, height 4' 9.99" (1.473 m), weight 62.5 kg, SpO2 95 %.   General: No acute distress Mood and affect are appropriate Heart: Regular rate and rhythm no rubs murmurs or extra sounds Lungs: Clear to auscultation, breathing unlabored, no rales or wheezes Abdomen: Positive bowel sounds, soft nontender to palpation, nondistended Extremities: No clubbing, cyanosis, or edema, left calf tenderness mild, no swelling or erythema   Skin: No evidence of breakdown, no evidence of rash  Prior exam: MSK:      No apparent deformity.      Strength:                RUE: 5/5 SA, 5/5 EF, 5/5 EE, 5/5 WE, 5/5 FF, 5/5 FA                 LUE: 4/5 SA, 4/5 EF, 4/5 EE, 4/5 WE, 4/5 FF, 4/5 FA                 RLE: 5/5 HF, 5/5 KE, 5/5 DF, 5/5 EHL, 5/5 PF                 LLE:  4/5 HF, 4/5 KE, 4/5 DF, 4/5 EHL, 4/5 PF    Neurologic exam:  Cognition: AAO to person, place, time and event.  Language: Fluent, No substitutions or neoglisms. +Hoarse, breathy vocal quality. Names 3/3 objects correctly.   Mood: Pleasant affect, appropriate mood.  Sensation: To light touch intact in BL UEs and LEs   Coordination: No  apparent tremors. No ataxia on FTN, HTS bilaterally.  Spasticity: MAS 0 in all extremities.   Assessment/Plan: 1. Functional deficits which require 3+ hours per day of interdisciplinary therapy in a comprehensive inpatient rehab setting. Physiatrist is providing close team supervision and 24 hour management of active medical problems listed below. Physiatrist and rehab team continue to assess barriers to discharge/monitor patient progress toward functional and medical goals  Care Tool:  Bathing    Body parts bathed by patient: Right arm, Left arm, Chest, Abdomen, Front perineal area, Buttocks, Right upper leg, Left upper leg, Face   Body parts bathed by helper: Left lower leg, Right lower leg     Bathing assist Assist Level: Minimal Assistance - Patient > 75%     Upper Body Dressing/Undressing Upper body dressing   What is the patient wearing?: Pull over shirt    Upper  body assist Assist Level: Set up assist    Lower Body Dressing/Undressing Lower body dressing      What is the patient wearing?: Pants, Incontinence brief     Lower body assist Assist for lower body dressing: Maximal Assistance - Patient 25 - 49%     Toileting Toileting    Toileting assist Assist for toileting: Minimal Assistance - Patient > 75%     Transfers Chair/bed transfer  Transfers assist     Chair/bed transfer assist level: Moderate Assistance - Patient 50 - 74%     Locomotion Ambulation   Ambulation assist      Assist level: Moderate Assistance - Patient 50 - 74% Assistive device: Hand held assist Max distance: 125ft   Walk 10 feet activity   Assist     Assist level: 2 helpers Assistive device: Hand held assist   Walk 50 feet activity   Assist Walk 50 feet with 2 turns activity did not occur: Safety/medical concerns         Walk 150 feet activity   Assist Walk 150 feet activity did not occur: Safety/medical concerns         Walk 10 feet on uneven surface   activity   Assist Walk 10 feet on uneven surfaces activity did not occur: Safety/medical concerns         Wheelchair     Assist Is the patient using a wheelchair?: Yes Type of Wheelchair: Manual    Wheelchair assist level: Moderate Assistance - Patient 50 - 74% Max wheelchair distance: 150'    Wheelchair 50 feet with 2 turns activity    Assist        Assist Level: Moderate Assistance - Patient 50 - 74%   Wheelchair 150 feet activity     Assist      Assist Level: Moderate Assistance - Patient 50 - 74%   Blood pressure (!) 142/75, pulse 90, temperature 98 F (36.7 C), temperature source Oral, resp. rate 18, height 4' 9.99" (1.473 m), weight 62.5 kg, SpO2 95 %.  Medical Problem List and Plan: 1. 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 metastatic             -patient may shower             -ELOS/Goals: 12-14 days, SPV PT/OT/SLP goals  -Continue CIR 2. Antithrombotics: -DVT/anticoagulation:  Pharmaceutical: Eliquis 5 mg BID -antiplatelet therapy: N/A - was on ASA and Plavix prior to stroke, now on Eliquis only, will be on 10mg  BID x7d then resume 5mg , Dr Julien Nordmann in agreement   3. LBP/Pain Management: Will continue tylenol 650mg  q6h PRN for now.  -order ben gay for Left calf pain 4. Mood/Behavior/Sleep: LCSW to follow for evaluation and support.                       --Melatonin 6mg  QHS PRN, Trazodone prn for insomnia.        -antipsychotic agents: N/A 5. Neuropsych/cognition: This patient is capable of making decisions on her own behalf. 6. Skin/Wound Care: Routine pressure relief measures.   7. Fluids/Electrolytes/Nutrition: Monitor I/O. Monitor weekly labs starting 01/20/23 -01/18/23 CMP unremarkable, monitor on weekly labs   8. HTN: Monitor BP TID--continue to hold Valsartan and HCTZ.              --SBP trending 100- 140 range. Avoid hypoperfusion   -01/19/23 BPs stable, cont to monitor Vitals:    01/21/23 1950 01/22/23  0325 01/22/23 0325 01/22/23 1040  BP: 118/71 (!) 150/78 (!) 150/78 126/73   01/22/23 1429 01/22/23 1919 01/22/23 2203 01/23/23 0517  BP: 129/70 (!) 139/91 (!) 137/58 (!) 149/75   01/23/23 0652 01/23/23 1104 01/23/23 1919 01/24/23 0343  BP: (!) 141/68 133/68 (!) 155/71 (!) 142/75      9. Stage IV  NSCLC w/lymphadenopathy/muscular nodules: Chemo to resume after discharge. --Per neurology, repeat MRI brain w/ and w/o in 6-8 weeks which would be ` last 2 weeks of March to continue to monitor for potential metastatic disease. Will need neuro outpt f/u 2-4 wks post discharge  10. GERD s/p esophageal dilatation: Continue Protonix 40mg  BID. Will add pepcid 20mg  BID also.  11. Anxiety/depression: On Paxil 40mg  daily w/xanax 0.5mg  QHS prn.   12. Hyperlipidemia: On Crestor 20mg  QD  13. Neck pain/Headaches: On PRN Fioricet 2 tabs Q6H, using consistently.  Will add Topamax 25 mg bid.              --lidocaine patch for local measures.              -- Flexeril 5 mg TID PRN for muscle tightness in neck --increased topamax to 50mg  BID- reassess for dose  increase in am    14. Horseness/L-VC paralysis : Has been ongoing since August-->a little worse.               -- SLP for swallow, speech              -- ISB   15. Dizziness/nausea/double vision: R-beating nystagmus on exam. Vestibular eval and treat.  -01/18/23 was getting Zofran inpatient, ordered 4mg  ODT q8h PRN n/v; also has Compazine PRN  16. Persistent cough since CVA. Causing cervicalgia, HA's, and nausea/vomiting with PO intakes.              --Treat cough -rule out aspiration/post obstructive infection.                -- Duo nebs qid added w/tessalon perles prn --may need pulmonary involved if no better.  -01/18/23 CXR yesterday without active disease; seems better today, though poor inspiratory effort. Continue Duonebs, Tessalon 200mg  TID PRN, Robitussin PRN, and monitor  -01/19/23 lungs sound ok today, coughing spells  seems to be with PO intake, monitor for now but no acute change needed today  17. Anemia: Hgb down to 10.5 on 01/10/23, stable at 10.7 on 01/18/23; monitor on weekly labs starting 3/89/37  -Continue Folic acid 1mg  QD, multivitamins  18. Hx of rectal surgery, chronic constipation:  -01/18/23 LBM yesterday, but requests stool softeners given hx of rectal surgery. Ordered Miralax 17g QD + PRN, Colace 100mg  QD + PRN. Has Dulcolax supp PRN, Fleet's PRN.  -01/19/23 no BM in 2 days, doesn't like miralax; change miralax to PRN only, increase Colace to 100mg  BID+PRN, add Senna 8.6mg  QD Give sorbitol x 1 19. LLE pain: nursing reporting this after assessment this morning 01/18/23, per PT reports-- PT concerned for DVT, no significant swelling noted on exam this morning -01/18/23 ordered doppler U/S bilateral LEs to r/o DVT given hx of malignancy, continue above pain meds + DVT , increase Eliquis to 10mg  BID x 7d then resume 5mg  BID- may cont therapy     LOS: 7 days A FACE TO FACE EVALUATION WAS PERFORMED  Patricia [name] 01/24/2023, 8:08 AM

## 2023-01-24 NOTE — Plan of Care (Signed)
  Problem: Consults Goal: RH STROKE PATIENT EDUCATION Description: See Patient Education module for education specifics  Outcome: Progressing   Problem: RH BOWEL ELIMINATION Goal: RH STG MANAGE BOWEL WITH ASSISTANCE Description: STG Manage Bowel with toileting Assistance. Outcome: Progressing Goal: RH STG MANAGE BOWEL W/MEDICATION W/ASSISTANCE Description: STG Manage Bowel with Medication with mod I Assistance. Outcome: Progressing   Problem: RH BLADDER ELIMINATION Goal: RH STG MANAGE BLADDER WITH ASSISTANCE Description: STG Manage Bladder With toileting Assistance Outcome: Progressing   Problem: RH PAIN MANAGEMENT Goal: RH STG PAIN MANAGED AT OR BELOW PT'S PAIN GOAL Description: < 4 with prns Outcome: Progressing   Problem: RH KNOWLEDGE DEFICIT Goal: RH STG INCREASE KNOWLEDGE OF HYPERTENSION Description: Patient and spouse will be able to manage HTN with medications and dietary modifications using educational resources indepdendently Outcome: Progressing Goal: RH STG INCREASE KNOWLEGDE OF HYPERLIPIDEMIA Description: Patient and spouse will be able to manage HLD with medications and dietary modifications using educational resources indepdendently Outcome: Progressing Goal: RH STG INCREASE KNOWLEDGE OF STROKE PROPHYLAXIS Description: Patient and spouse will be able to manage secondary risks with medications and dietary modifications using educational resources indepdendently Outcome: Progressing   Problem: Education: Goal: Knowledge of disease or condition will improve Outcome: Progressing Goal: Knowledge of secondary prevention will improve (MUST DOCUMENT ALL) Outcome: Progressing Goal: Knowledge of patient specific risk factors will improve Elta [place] N/A or DELETE if not current risk factor) Outcome: Progressing   Problem: Ischemic Stroke/TIA Tissue Perfusion: Goal: Complications of ischemic stroke/TIA will be minimized Outcome: Progressing   Problem: Coping: Goal: Will  verbalize positive feelings about self Outcome: Progressing Goal: Will identify appropriate support needs Outcome: Progressing   Problem: Health Behavior/Discharge Planning: Goal: Ability to manage health-related needs will improve Outcome: Progressing Goal: Goals will be collaboratively established with patient/family Outcome: Progressing   Problem: Self-Care: Goal: Ability to participate in self-care as condition permits will improve Outcome: Progressing Goal: Verbalization of feelings and concerns over difficulty with self-care will improve Outcome: Progressing Goal: Ability to communicate needs accurately will improve Outcome: Progressing   Problem: Nutrition: Goal: Risk of aspiration will decrease Outcome: Progressing Goal: Dietary intake will improve Outcome: Progressing

## 2023-01-24 NOTE — Progress Notes (Signed)
Patient ID: Patricia [name], female   DOB: 11-09-1946, 77 y.o.   MRN: [id number]  Sw made attempt to call pt's granddaughter, [name] to arrange family education. Left detailed VM to discuss potential family education next Wednesday or Thursday.    SW made attempt to call patient spouse to arrange family education. Left detailed VM to discuss potential family education next Wednesday or Thursday.

## 2023-01-24 NOTE — Progress Notes (Signed)
Patient ID: Patricia [name], female   DOB: 03-09-1946, 76 y.o.   MRN: [id number]  Wheelchair ordered through [facility].

## 2023-01-24 NOTE — Progress Notes (Signed)
Recreational Therapy Session Note  Patient Details  Name: Patricia [name] MRN: [id number] Date of Birth: [birth year] Today's Date: 01/24/2023  Eval deferred until next week.  Pt using the restroom during scheduled session today.  [place] 01/24/2023, 12:23 PM

## 2023-01-24 NOTE — Progress Notes (Addendum)
Speech Language Pathology Daily Session Note  Patient Details  Name: Patricia [name] MRN: [id number] Date of Birth: [birth year]  Today's Date: 01/24/2023 SLP Individual Time: 1135-1200 SLP Individual Time Calculation (min): 25 min  Short Term Goals: Week 2: SLP Short Term Goal 1 (Week 2): STG=LTG due to ELOS  Skilled Therapeutic Interventions: Pt seen this date for skilled ST intervention targeting cognitive goals outlined in care plan. Pt received awake/alert and lying semi-reclined in bed; repositioned to fully upright. Agreeable to intervention at bedside. Spouse stepped out for therapy. Intermittent coughing noted during today's session, in the absence of PO. Complains of pain along L side.  SLP facilitated today's session by providing overall demonstration prompts followed by Min A verbal cues for accurate sequencing of 4-step ADL pictures - achieved 100% accuracy. Attempted use of amplifier to assess if this would assist with comprehensibility to communication partner; however, amplifier not working. Will plan to try again in upcoming sessions. Pt requesting to use the bathroom. Provided Min A for transfer from bed to [facility]. Required Sup A for safety awareness during functional transfer. Pt deferred PO trials this date.  Pt left on [facility] with spouse present for supervision and call bell reviewed and within reach. Continue per current ST POC.   Pain Pain along L side body.  Therapy/Group: Individual Therapy  Uno Esau A Granger Chui 01/24/2023, 3:27 PM

## 2023-01-24 NOTE — Progress Notes (Signed)
Physical Therapy Session Note  Patient Details  Name: Patricia [name] MRN: [id number] Date of Birth: 1946-09-18  Today's Date: 01/24/2023 PT Individual Time: 0917-1001 PT Individual Time Calculation (min): 44 min   Short Term Goals: Week 1:  PT Short Term Goal 1 (Week 1): Pt will perform supine<>sit with min assist PT Short Term Goal 1 - Progress (Week 1): Met PT Short Term Goal 2 (Week 1): Pt will perform sit<>stands using LRAD with min assist PT Short Term Goal 2 - Progress (Week 1): Progressing toward goal PT Short Term Goal 3 (Week 1): Pt will perform bed<>chair transfers using LRAD with min assist PT Short Term Goal 3 - Progress (Week 1): Progressing toward goal PT Short Term Goal 4 (Week 1): Pt will ambulate at least 56ft using LRAD with mod assist of 1 (+2 w/c follow if needed) PT Short Term Goal 4 - Progress (Week 1): Met PT Short Term Goal 5 (Week 1): Pt will initiate stair navigation training PT Short Term Goal 5 - Progress (Week 1): Not met Week 2:  PT Short Term Goal 1 (Week 2): = to LTGs based on ELOS  Skilled Therapeutic Interventions/Progress Updates:  Patient seated upright in w/c on entrance to room. Patient alert and agreeable to PT session.   Patient with headache pain complaint at start of session. Relates receiving something from RN however believes that it was Tylenol and headache has not subsided. Relates taking other medication specifically for HA pain and requesting prior to  session. RN notified of pt request for headache medicine and receives prior to leaving room for session.  Therapeutic Activity: Transfers: Pt performed sit<>stand transfer at start of session pushing hips forward from seat and unable to shift weight forward to attain standing balance. Allowed pt to fail with assisted return to sit in w/c. Following NMR and blocked practice for appropriate forward lean and weight shift, pt able to rise to stand with light MinA for power up/ CGA. Required vc/  tc for forward lean throughout session.   Gait Training:  Pt ambulated 45 ft using Bil HHA with CGA/ MinA. Demonstrated good balance with min pressure into therapist's BUE. Provided vc/ tc for balance and weight shift for improved foot clearance.  Neuromuscular Re-ed: NMR facilitated during session with focus on standing balance, motor control, confidence/ self efficacy. Pt guided in toe touches to 3" step, then 5" step x10 for LLE with block to R knee then RLE. Performed to increase hip and knee flexion strength as well as to improve motor control for improve quality of gait. Pt then guided in sit<>stand with increased forward lean and then biased to RLE with LLE on low step with use of RW for UE support.  NMR performed for improvements in motor control and coordination, balance, sequencing, judgement, and self confidence/ efficacy in performing all aspects of mobility at highest level of independence.   Patient seated upright at end of session with brakes locked, belt alarm set, and all needs within reach.   Therapy Documentation Precautions:  Precautions Precautions: Fall, Other (comment) Precaution Comments: L hemiparesis, L lean Restrictions Weight Bearing Restrictions: No General:   Vital Signs:   Pain:  Following HA pain medication, no complaint of HA from pt throughout session.   Therapy/Group: Individual Therapy  Alger Simons PT, DPT, CSRS 01/24/2023, 6:49 PM

## 2023-01-24 NOTE — Progress Notes (Signed)
Occupational Therapy Weekly Progress Note  Patient Details  Name: DAYAN KREIS MRN: [id number] Date of Birth: [birth year]  Beginning of progress report period: January 18, 2023 End of progress report period: January 24, 2023  Today's Date: 01/24/2023 OT Individual Time: 1102-1200 OT Individual Time Calculation (min): 58 min  OT Individual Time: 7262-0355 OT Individual Time Calculation (min): 72 min    Patient has met 0 of 4 short term goals, however is progressing towards reaching STG and Ltg goals. Pt is steadily demonstrating increased independence with functional mobility and BADLs. She is performing LB dressing mod A, LB bathing mod A, UB dressing supervision, UB bathing supervision, grooming/hygiene supervision wc level, and toilet/shower transfers min/mod A with HHA.   Patient continues to demonstrate the following deficits: muscle weakness, decreased cardiorespiratoy endurance, decreased midline orientation, decreased attention to left, and decreased motor planning, decreased attention, decreased awareness, decreased problem solving, decreased safety awareness, and decreased memory, and decreased sitting balance, decreased standing balance, decreased postural control, and decreased balance strategies and therefore will continue to benefit from skilled OT intervention to enhance overall performance with BADL and Reduce care partner burden.  Patient progressing toward long term goals..  Plan of care revisions: Toileting and toilet transfer goals downgraded due to change in POC for shorter LOS.Pt family is available to provide the necessary assistance at d/c. See POC revisions.   OT Short Term Goals Week 1:  OT Short Term Goal 1 (Week 1): The pt will transfer to all surfaces with MinA at 95% safe OT Short Term Goal 1 - Progress (Week 1): Progressing toward goal OT Short Term Goal 2 (Week 1): The pt will complete LB dressing with MinA at 95% safe OT Short Term Goal 2 - Progress (Week  1): Progressing toward goal OT Short Term Goal 3 (Week 1): The pt will tolerate >45 minutes of activity with minimal rest breaks @ 95% safe OT Short Term Goal 3 - Progress (Week 1): Progressing toward goal OT Short Term Goal 4 (Week 1): The pt will complete toileting with MinA by incorporating the grab bars  @ 95% safe OT Short Term Goal 4 - Progress (Week 1): Progressing toward goal Week 2:  OT Short Term Goal 1 (Week 2): STG=LTG d/t Pt ELOS  Skilled Therapeutic Interventions/Progress Updates:     AM Session:  Pt received sitting up  in wc with RN present in room providing morning medications. Pt reporting 9/10 pain in LLE- Rn providing pain medications, OT offering rest breaks, repositioning, and gentle stretches. MD in/out for morning rounding.   Pt requesting water at beginning of session- provided in provolone cup. Pt also stating she has not been asking for water d/t Pt not wanting to get up and go to the bathroom. Education provided on importance of water intake for supporting health and recovery with Pt receptive. Reeducated Pt on call bell use and reinforced availability of nursing staff to take Pt to bathroom when needed. Pt noted to have increased coughing when overly speaking with therapist during session requiring maximal cueing to pause, swallow, and take a deep breath when coughing vs. speaking or drinking water.   Shoes and socks donned on Pt total A for time management. Pt transported total A to therapy gym in wc for energy conservation.   Pt completed functional mobility in therapy gym mod HHA with facilitation of R/L weight shifting and step length.   Pt completed marches in standing with tactile and visual cues provided to increase step  height, pacing, and midline orientation. Pt presenting with posterior and L lean with max education and cueing required to increase Pt awareness to this and Pt unable to correct without mod A +time.    Pt propelled wc back to room with mod A  for navigating environment and max Vb'cueing for technique, attention, and awareness to L side. External cues provided to support pacing.   Pt transferred back to bed mod HHA. Pt was left resting in bed with call bell in reach, bed alarm on, and all needs met.   PM Session:  Pt received sitting up in bed with family and friends present in room. Pt presenting to be in good spirits, however reporting head ache and pain in LLE. Contacted Rn- provided pain medications at beginning of session with OT offering changes to environment, rest breaks, and repositioning to accommodate for pain.   Pt transitioned to EOB with min A to lift trunk. Sit<>stand mod HHA with cueing required for positioning and technique. Pt unaware of LLE placement requiring mod cueing.   Functional mobility training in room ~10 ft mod HHA with facilitation for weight shifting and mod cueing for step length and positioning.  Pt transported total a to therapy gym in wc for time management and energy conservation.   Pt completed standing checkers activity at elevated table without AD. Focused beginning of activity on assisting Pt with anterior/posterior and lateral weight shifting to find COG. Facilitated cross body reaching and lateral weight shifting during task with single UE supported on table. Pt requiring min A to maintain balance with maximal tactile cueing for weight shifting.    Pt completed marches in place in preparation for walking with BUEs supported on Ot's shoulders and OT facilitating weight shifting through tactile cueing. Pt completing at overall mod A level.   Pt completed functional mobility training with RW 61ft x2 trials to facilitate anterior weight shift as Pt presenting with significant posterior and L lean. Pt requiring max A for RW management and mod tactile/VB cueing for step length and COG.  Pt completed standing corn hole game to work on dynamic standing balance with a focus on bringing trunk into midline  with anterior weight shifting facilitation. Pt requiring maximal cueing to focus attention on locating midline vs. Playing game or attempting off topic discussion with OT.  Pt urgently requesting to use restroom at end of session. Transported back to room total A in wc. Stand pivot>toilet mod HHA. Pt handed off to nursing staff at end of session with all immediate needs met d/t increased time need on toilet.   Therapy Documentation Precautions:  Precautions Precautions: Fall, Other (comment) Precaution Comments: L hemiparesis, L lean Restrictions Weight Bearing Restrictions: No General:   ADL: ADL Equipment Provided: [place] Eating: Not assessed (based on observation pt is s/u assist does present with swallowing challenges encouraged to tuck chin to reduce incidence for adverse response.) Where Assessed-Eating: Wheelchair Grooming: Setup Where Assessed-Grooming: Sitting at sink Upper Body Bathing: Setup Where Assessed-Upper Body Bathing: Sitting at sink Lower Body Bathing: Minimal assistance, Moderate assistance Where Assessed-Lower Body Bathing: Sitting at sink Upper Body Dressing: Setup Where Assessed-Upper Body Dressing: Wheelchair Lower Body Dressing: Minimal assistance, Moderate assistance Where Assessed-Lower Body Dressing: Wheelchair Toileting: Modified independent, Contact guard (secondary to challenges with transfer from w/c to commode needing additional time) Where Assessed-Toileting: Glass blower/designer: Moderate assistance, Minimal assistance (using grab bar and additional time.) Toilet Transfer Method: Stand pivot Toilet Transfer Equipment: Grab bars (grab bar and arm  of w/c) Tub/Shower Transfer: Minimal assistance, Moderate assistance (based on observation of toilet transfer.) Gaffer Transfer: Not assessed   Therapy/Group: Individual Therapy  [name] 01/24/2023, 7:58 AM

## 2023-01-25 MED ORDER — SENNOSIDES-DOCUSATE SODIUM 8.6-50 MG PO TABS
3.0000 | ORAL_TABLET | Freq: Two times a day (BID) | ORAL | Status: DC | PRN
  Administered 2023-01-27: 3 via ORAL
  Filled 2023-01-25: qty 3

## 2023-01-25 MED ORDER — MUSCLE RUB 10-15 % EX CREA
TOPICAL_CREAM | Freq: Three times a day (TID) | CUTANEOUS | Status: DC
  Filled 2023-01-25: qty 85

## 2023-01-25 NOTE — Progress Notes (Signed)
PROGRESS NOTE   Subjective/Complaints:  Doing ok, had diarrhea yesterday, that's slowed down a bit. But slept poorly because of it. Still having LLE pain, Bengay helps a lot, would like that scheduled instead of PRN. HA/Pain otherwise improving with meds. Urinating well. Denies any other complaints or concerns today.   ROS: +LLE pain with standing, +diarrhea, +cough-better. Denies fevers, chills, CP, SOB, abd pain, N/V/C  Objective:   No results found. No results for input(s): "WBC", "HGB", "HCT", "PLT" in the last 72 hours.  No results for input(s): "NA", "K", "CL", "CO2", "GLUCOSE", "BUN", "CREATININE", "CALCIUM" in the last 72 hours.   Intake/Output Summary (Last 24 hours) at 01/25/2023 0721 Last data filed at 01/24/2023 1852 Gross per 24 hour  Intake 716 ml  Output --  Net 716 ml         Physical Exam: Vital Signs Blood pressure (!) 143/73, pulse 84, temperature 98.6 F (37 C), temperature source Oral, resp. rate 18, height 4' 9.99" (1.473 m), weight 64.2 kg, SpO2 96 %.   General: No acute distress, laying in bed Mood and affect are appropriate, less tearful this week Heart: Regular rate and rhythm no rubs murmurs or extra sounds Lungs: Clear to auscultation, breathing unlabored, no rales, rhonchi, or wheezes Abdomen: Positive bowel sounds, soft nontender to palpation, nondistended Extremities: No clubbing, cyanosis, or edema, left calf tenderness mild, no swelling or erythema   Skin: No evidence of breakdown, no evidence of rash  Prior exam: MSK:      No apparent deformity.      Strength:                RUE: 5/5 SA, 5/5 EF, 5/5 EE, 5/5 WE, 5/5 FF, 5/5 FA                 LUE: 4/5 SA, 4/5 EF, 4/5 EE, 4/5 WE, 4/5 FF, 4/5 FA                 RLE: 5/5 HF, 5/5 KE, 5/5 DF, 5/5 EHL, 5/5 PF                 LLE:  4/5 HF, 4/5 KE, 4/5 DF, 4/5 EHL, 4/5 PF    Neurologic exam:  Cognition: AAO to person, place, time and  event.  Language: Fluent, No substitutions or neoglisms. +Hoarse, breathy vocal quality. Names 3/3 objects correctly.   Mood: Pleasant affect, appropriate mood.  Sensation: To light touch intact in BL UEs and LEs   Coordination: No apparent tremors. No ataxia on FTN, HTS bilaterally.  Spasticity: MAS 0 in all extremities.   Assessment/Plan: 1. Functional deficits which require 3+ hours per day of interdisciplinary therapy in a comprehensive inpatient rehab setting. Physiatrist is providing close team supervision and 24 hour management of active medical problems listed below. Physiatrist and rehab team continue to assess barriers to discharge/monitor patient progress toward functional and medical goals  Care Tool:  Bathing    Body parts bathed by patient: Right arm, Left arm, Chest, Abdomen, Front perineal area, Buttocks, Right upper leg, Left upper leg, Face   Body parts bathed by helper: Left lower leg, Right lower leg  Bathing assist Assist Level: Minimal Assistance - Patient > 75%     Upper Body Dressing/Undressing Upper body dressing   What is the patient wearing?: Pull over shirt    Upper body assist Assist Level: Set up assist    Lower Body Dressing/Undressing Lower body dressing      What is the patient wearing?: Pants, Incontinence brief     Lower body assist Assist for lower body dressing: Maximal Assistance - Patient 25 - 49%     Toileting Toileting    Toileting assist Assist for toileting: Minimal Assistance - Patient > 75%     Transfers Chair/bed transfer  Transfers assist     Chair/bed transfer assist level: Moderate Assistance - Patient 50 - 74%     Locomotion Ambulation   Ambulation assist      Assist level: Moderate Assistance - Patient 50 - 74% Assistive device: Hand held assist Max distance: 179ft   Walk 10 feet activity   Assist     Assist level: 2 helpers Assistive device: Hand held assist   Walk 50 feet  activity   Assist Walk 50 feet with 2 turns activity did not occur: Safety/medical concerns         Walk 150 feet activity   Assist Walk 150 feet activity did not occur: Safety/medical concerns         Walk 10 feet on uneven surface  activity   Assist Walk 10 feet on uneven surfaces activity did not occur: Safety/medical concerns         Wheelchair     Assist Is the patient using a wheelchair?: Yes Type of Wheelchair: Manual    Wheelchair assist level: Moderate Assistance - Patient 50 - 74% Max wheelchair distance: 150'    Wheelchair 50 feet with 2 turns activity    Assist        Assist Level: Moderate Assistance - Patient 50 - 74%   Wheelchair 150 feet activity     Assist      Assist Level: Moderate Assistance - Patient 50 - 74%   Blood pressure (!) 143/73, pulse 84, temperature 98.6 F (37 C), temperature source Oral, resp. rate 18, height 4' 9.99" (1.473 m), weight 64.2 kg, SpO2 96 %.  Medical Problem List and Plan: 1. 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 metastatic             -patient may shower             -ELOS/Goals: 12-14 days, SPV PT/OT/SLP goals  -Continue CIR, d/c goal 02/01/23 2. Antithrombotics: -DVT/anticoagulation:  Pharmaceutical: Eliquis 5 mg BID>>inc to 10mg  BID x 7d (01/20/23-01/27/23) then resume 5mg  BID starting 01/27/23 @2000  -antiplatelet therapy: N/A - was on ASA and Plavix prior to stroke, now on Eliquis only, will be on 10mg  BID x7d then resume 5mg , Dr Julien Nordmann in agreement   3. LBP/Pain Management: Will continue tylenol 650mg  q6h PRN for now.  -order ben gay for Left calf pain-- changed to scheduled TID 01/25/23 -Lidoderm patch to neck QD 4. Mood/Behavior/Sleep: LCSW to follow for evaluation and support.                       --Melatonin 6mg  QHS PRN, Trazodone prn for insomnia.        -antipsychotic agents: N/A 5. Neuropsych/cognition: This patient is capable of  making decisions on her own behalf. 6. Skin/Wound Care: Routine pressure relief measures.   7. Fluids/Electrolytes/Nutrition:  Monitor I/O. Monitor weekly labs next 01/27/23 -01/18/23 CMP unremarkable, BMP 01/20/23 with mild hypokalemia 3.4  8. HTN: Monitor BP TID--continue to hold Valsartan and HCTZ.              --SBP trending 100- 140 range. Avoid hypoperfusion   -01/25/23 BPs stable, cont to monitor Vitals:   01/22/23 1040 01/22/23 1429 01/22/23 1919 01/22/23 2203  BP: 126/73 129/70 (!) 139/91 (!) 137/58   01/23/23 0517 01/23/23 0652 01/23/23 1104 01/23/23 1919  BP: (!) 149/75 (!) 141/68 133/68 (!) 155/71   01/24/23 0343 01/24/23 1432 01/24/23 1935 01/25/23 0306  BP: (!) 142/75 136/75 123/73 (!) 143/73      9. Stage IV  NSCLC w/lymphadenopathy/muscular nodules: Chemo to resume after discharge. --Per neurology, repeat MRI brain w/ and w/o in 6-8 weeks which would be ` last 2 weeks of March to continue to monitor for potential metastatic disease. Will need neuro outpt f/u 2-4 wks post discharge  10. GERD s/p esophageal dilatation: Continue Protonix 40mg  BID. Will add pepcid 20mg  BID also.  11. Anxiety/depression: On Paxil 40mg  daily w/xanax 0.5mg  QHS prn.   12. Hyperlipidemia: On Crestor 20mg  QD  13. Neck pain/Headaches: On PRN Fioricet 2 tabs Q6H, using consistently.  Will add Topamax 25 mg bid.              --lidocaine patch for local measures.              -- Flexeril 5 mg TID PRN for muscle tightness in neck --increased topamax to 50mg  BID- reassess for dose  increase in am  -01/25/23 improving, cont regimen   14. Horseness/L-VC paralysis : Has been ongoing since August-->a little worse.               -- SLP for swallow, speech              -- ISB   15. Dizziness/nausea/double vision: R-beating nystagmus on exam. Vestibular eval and treat.  -01/18/23 was getting Zofran inpatient, ordered 4mg  ODT q8h PRN n/v; also has Compazine PRN  16. Persistent cough since CVA. Causing  cervicalgia, HA's, and nausea/vomiting with PO intakes.              --Treat cough -rule out aspiration/post obstructive infection.                -- Duo nebs qid added w/tessalon perles prn --may need pulmonary involved if no better.  -01/18/23 CXR yesterday without active disease; seems better today, though poor inspiratory effort. Continue Duonebs, Tessalon 200mg  TID PRN, Robitussin PRN, and monitor  -01/19/23 lungs sound ok today, coughing spells seems to be with PO intake, monitor for now but no acute change needed today -01/25/23 CXR yesterday reassuring, cough improving, cont regimen  17. Anemia: Hgb down to 10.5 on 01/10/23, stable at 10.7 on 01/20/23; monitor on weekly labs next 4/40/34  -Continue Folic acid 1mg  QD, multivitamins  18. Hx of rectal surgery, chronic constipation:  -01/18/23 LBM yesterday, but requests stool softeners given hx of rectal surgery. Ordered Miralax 17g QD + PRN, Colace 100mg  QD + PRN. Has Dulcolax supp PRN, Fleet's PRN.  -01/19/23 no BM in 2 days, doesn't like miralax; change miralax to PRN only, increase Colace to 100mg  BID+PRN, add Senna 8.6mg  QD -Given sorbitol x 1 on 01/20/23; D/c'd Colace, Senokot, and Miralax on 01/22/23, started dulcolax supp QD and Senokot-S 3 tabs BID on 01/22/23. -01/25/23 now with diarrhea; change Senokot-S to BID PRN, leave Dulcolax supp in  place, monitor for now  19. LLE pain: nursing reporting this after assessment this morning 01/18/23, per PT reports-- PT concerned for DVT, no significant swelling noted on exam this morning -01/18/23 ordered doppler U/S bilateral LEs to r/o DVT given hx of malignancy, continue above pain meds -01/20/23 + DVT , increase Eliquis to 10mg  BID x 7d then resume 5mg  BID- may cont therapy  -01/25/23 scheduled Bengay as above    LOS: 8 days A FACE TO [employer] 01/25/2023, 7:21 AM

## 2023-01-26 NOTE — Progress Notes (Signed)
PROGRESS NOTE   Subjective/Complaints:  Doing well today, slept well and urinating well. LBM yesterday and diarrhea has slowed/stopped. Pain doing better, headaches improving with meds. Denies any other complaints or concerns today.   ROS: +LLE pain with standing, +diarrhea-better, +cough-better. Denies fevers, chills, CP, SOB, abd pain, N/V/C  Objective:   No results found. No results for input(s): "WBC", "HGB", "HCT", "PLT" in the last 72 hours.  No results for input(s): "NA", "K", "CL", "CO2", "GLUCOSE", "BUN", "CREATININE", "CALCIUM" in the last 72 hours.   Intake/Output Summary (Last 24 hours) at 01/26/2023 1202 Last data filed at 01/26/2023 0803 Gross per 24 hour  Intake 720 ml  Output --  Net 720 ml        Physical Exam: Vital Signs Blood pressure (!) 148/66, pulse 74, temperature (!) 97.5 F (36.4 C), temperature source Oral, resp. rate 17, height 4' 9.99" (1.473 m), weight 63.1 kg, SpO2 96 %.   General: No acute distress, laying in bed Mood and affect are appropriate, less tearful this week Heart: Regular rate and rhythm no rubs murmurs or extra sounds Lungs: Clear to auscultation, breathing unlabored, no rales, rhonchi, or wheezes Abdomen: Positive bowel sounds, soft nontender to palpation, nondistended Extremities: No clubbing, cyanosis, or edema, left calf tenderness mild and seems improved today, no swelling or erythema   Skin: No evidence of breakdown, no evidence of rash  Prior exam: MSK:      No apparent deformity.      Strength:                RUE: 5/5 SA, 5/5 EF, 5/5 EE, 5/5 WE, 5/5 FF, 5/5 FA                 LUE: 4/5 SA, 4/5 EF, 4/5 EE, 4/5 WE, 4/5 FF, 4/5 FA                 RLE: 5/5 HF, 5/5 KE, 5/5 DF, 5/5 EHL, 5/5 PF                 LLE:  4/5 HF, 4/5 KE, 4/5 DF, 4/5 EHL, 4/5 PF    Neurologic exam:  Cognition: AAO to person, place, time and event.  Language: Fluent, No substitutions or  neoglisms. +Hoarse, breathy vocal quality. Names 3/3 objects correctly.   Mood: Pleasant affect, appropriate mood.  Sensation: To light touch intact in BL UEs and LEs   Coordination: No apparent tremors. No ataxia on FTN, HTS bilaterally.  Spasticity: MAS 0 in all extremities.     Assessment/Plan: 1. Functional deficits which require 3+ hours per day of interdisciplinary therapy in a comprehensive inpatient rehab setting. Physiatrist is providing close team supervision and 24 hour management of active medical problems listed below. Physiatrist and rehab team continue to assess barriers to discharge/monitor patient progress toward functional and medical goals  Care Tool:  Bathing    Body parts bathed by patient: Right arm, Left arm, Chest, Abdomen, Front perineal area, Buttocks, Right upper leg, Left upper leg, Face   Body parts bathed by helper: Left lower leg, Right lower leg     Bathing assist Assist Level: Minimal Assistance -  Patient > 75%     Upper Body Dressing/Undressing Upper body dressing   What is the patient wearing?: Pull over shirt    Upper body assist Assist Level: Set up assist    Lower Body Dressing/Undressing Lower body dressing      What is the patient wearing?: Pants, Incontinence brief     Lower body assist Assist for lower body dressing: Maximal Assistance - Patient 25 - 49%     Toileting Toileting    Toileting assist Assist for toileting: Minimal Assistance - Patient > 75%     Transfers Chair/bed transfer  Transfers assist     Chair/bed transfer assist level: Moderate Assistance - Patient 50 - 74%     Locomotion Ambulation   Ambulation assist      Assist level: Moderate Assistance - Patient 50 - 74% Assistive device: Hand held assist Max distance: 170ft   Walk 10 feet activity   Assist     Assist level: 2 helpers Assistive device: Hand held assist   Walk 50 feet activity   Assist Walk 50 feet with 2 turns activity  did not occur: Safety/medical concerns         Walk 150 feet activity   Assist Walk 150 feet activity did not occur: Safety/medical concerns         Walk 10 feet on uneven surface  activity   Assist Walk 10 feet on uneven surfaces activity did not occur: Safety/medical concerns         Wheelchair     Assist Is the patient using a wheelchair?: Yes Type of Wheelchair: Manual    Wheelchair assist level: Moderate Assistance - Patient 50 - 74% Max wheelchair distance: 150'    Wheelchair 50 feet with 2 turns activity    Assist        Assist Level: Moderate Assistance - Patient 50 - 74%   Wheelchair 150 feet activity     Assist      Assist Level: Moderate Assistance - Patient 50 - 74%   Blood pressure (!) 148/66, pulse 74, temperature (!) 97.5 F (36.4 C), temperature source Oral, resp. rate 17, height 4' 9.99" (1.473 m), weight 63.1 kg, SpO2 96 %.  Medical Problem List and Plan: 1. 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 metastatic             -patient may shower             -ELOS/Goals: 12-14 days, SPV PT/OT/SLP goals  -Continue CIR, d/c goal 02/01/23 2. Antithrombotics: -DVT/anticoagulation:  Pharmaceutical: Eliquis 5 mg BID>>inc to 10mg  BID x 7d (01/20/23-01/27/23) then resume 5mg  BID starting 01/27/23 @2000  -antiplatelet therapy: N/A - was on ASA and Plavix prior to stroke, now on Eliquis only, will be on 10mg  BID x7d then resume 5mg , Dr Julien Nordmann in agreement   3. LBP/Pain Management: Will continue tylenol 650mg  q6h PRN for now.  -order ben gay for Left calf pain-- changed to scheduled TID 01/25/23 -Lidoderm patch to neck QD 4. Mood/Behavior/Sleep: LCSW to follow for evaluation and support.                       --Melatonin 6mg  QHS PRN, Trazodone prn for insomnia.        -antipsychotic agents: N/A 5. Neuropsych/cognition: This patient is capable of making decisions on her own behalf. 6. Skin/Wound  Care: Routine pressure relief measures.   7. Fluids/Electrolytes/Nutrition: Monitor I/O. Monitor weekly labs next  01/27/23 -01/18/23 CMP unremarkable, BMP 01/20/23 with mild hypokalemia 3.4  8. HTN: Monitor BP TID--continue to hold Valsartan and HCTZ.              --SBP trending 100- 140 range. Avoid hypoperfusion   -01/26/23 BPs stable, cont to monitor Vitals:   01/22/23 2203 01/23/23 0517 01/23/23 0652 01/23/23 1104  BP: (!) 137/58 (!) 149/75 (!) 141/68 133/68   01/23/23 1919 01/24/23 0343 01/24/23 1432 01/24/23 1935  BP: (!) 155/71 (!) 142/75 136/75 123/73   01/25/23 0306 01/25/23 1326 01/25/23 1938 01/26/23 0308  BP: (!) 143/73 (!) 140/74 119/70 (!) 148/66      9. Stage IV  NSCLC w/lymphadenopathy/muscular nodules: Chemo to resume after discharge. --Per neurology, repeat MRI brain w/ and w/o in 6-8 weeks which would be ` last 2 weeks of March to continue to monitor for potential metastatic disease. Will need neuro outpt f/u 2-4 wks post discharge  10. GERD s/p esophageal dilatation: Continue Protonix 40mg  BID. Will add pepcid 20mg  BID also.  11. Anxiety/depression: On Paxil 40mg  daily w/xanax 0.5mg  QHS prn.   12. Hyperlipidemia: On Crestor 20mg  QD  13. Neck pain/Headaches: On PRN Fioricet 2 tabs Q6H, using consistently.  Will add Topamax 25 mg bid.              --lidocaine patch for local measures.              -- Flexeril 5 mg TID PRN for muscle tightness in neck --increased topamax to 50mg  BID- reassess for dose  increase in am  -01/25/23 improving, cont regimen  14. Horseness/L-VC paralysis : Has been ongoing since August-->a little worse.               -- SLP for swallow, speech              -- ISB   15. Dizziness/nausea/double vision: R-beating nystagmus on exam. Vestibular eval and treat.  -01/18/23 was getting Zofran inpatient, ordered 4mg  ODT q8h PRN n/v; also has Compazine PRN  16. Persistent cough since CVA. Causing cervicalgia, HA's, and nausea/vomiting with PO  intakes.              --Treat cough -rule out aspiration/post obstructive infection.                -- Duo nebs qid added w/tessalon perles prn --may need pulmonary involved if no better.  -01/18/23 CXR yesterday without active disease; seems better today, though poor inspiratory effort. Continue Duonebs, Tessalon 200mg  TID PRN, Robitussin PRN, and monitor  -01/19/23 lungs sound ok today, coughing spells seems to be with PO intake, monitor for now but no acute change needed today -01/25/23 CXR yesterday reassuring, cough improving, cont regimen  17. Anemia: Hgb down to 10.5 on 01/10/23, stable at 10.7 on 01/20/23; monitor on weekly labs next 5/59/74  -Continue Folic acid 1mg  QD, multivitamins  18. Hx of rectal surgery, chronic constipation:  -01/18/23 LBM yesterday, but requests stool softeners given hx of rectal surgery. Ordered Miralax 17g QD + PRN, Colace 100mg  QD + PRN. Has Dulcolax supp PRN, Fleet's PRN.  -01/19/23 no BM in 2 days, doesn't like miralax; change miralax to PRN only, increase Colace to 100mg  BID+PRN, add Senna 8.6mg  QD -Given sorbitol x 1 on 01/20/23; D/c'd Colace, Senokot, and Miralax on 01/22/23, started dulcolax supp QD and Senokot-S 3 tabs BID on 01/22/23. -01/25/23 now with diarrhea; change Senokot-S to BID PRN, leave Dulcolax supp in place, monitor for now -01/26/23 diarrhea  slowed/stopped, monitor for constipation since stool regimen has changed.  19. LLE pain: nursing reporting this after assessment this morning 01/18/23, per PT reports-- PT concerned for DVT, no significant swelling noted on exam this morning -01/18/23 ordered doppler U/S bilateral LEs to r/o DVT given hx of malignancy, continue above pain meds -01/20/23 + DVT , increase Eliquis to 10mg  BID x 7d then resume 5mg  BID- may cont therapy  -01/25/23 scheduled Bengay as above -01/26/23 much better pain control, cont to monitor    LOS: 9 days A FACE TO [place] 01/26/2023, 12:02 PM

## 2023-01-27 LAB — CBC
HCT: 33.3 % — ABNORMAL LOW (ref 36.0–46.0)
Hemoglobin: 10.8 g/dL — ABNORMAL LOW (ref 12.0–15.0)
MCH: 29.5 pg (ref 26.0–34.0)
MCHC: 32.4 g/dL (ref 30.0–36.0)
MCV: 91 fL (ref 80.0–100.0)
Platelets: 186 10*3/uL (ref 150–400)
RBC: 3.66 MIL/uL — ABNORMAL LOW (ref 3.87–5.11)
RDW: 16.7 % — ABNORMAL HIGH (ref 11.5–15.5)
WBC: 4.2 10*3/uL (ref 4.0–10.5)
nRBC: 0 % (ref 0.0–0.2)

## 2023-01-27 LAB — BASIC METABOLIC PANEL
Anion gap: 13 (ref 5–15)
BUN: 14 mg/dL (ref 8–23)
CO2: 20 mmol/L — ABNORMAL LOW (ref 22–32)
Calcium: 9.1 mg/dL (ref 8.9–10.3)
Chloride: 107 mmol/L (ref 98–111)
Creatinine, Ser: 0.88 mg/dL (ref 0.44–1.00)
GFR, Estimated: >60 mL/min (ref >60)
Glucose, Bld: 117 mg/dL — ABNORMAL HIGH (ref 70–99)
Potassium: 3.4 mmol/L — ABNORMAL LOW (ref 3.5–5.1)
Sodium: 140 mmol/L (ref 135–145)

## 2023-01-27 MED ORDER — POTASSIUM CHLORIDE CRYS ER 10 MEQ PO TBCR
10.0000 meq | EXTENDED_RELEASE_TABLET | Freq: Two times a day (BID) | ORAL | Status: AC
  Administered 2023-01-27 – 2023-01-28 (×4): 10 meq via ORAL
  Filled 2023-01-27 (×4): qty 1

## 2023-01-27 NOTE — Progress Notes (Signed)
PROGRESS NOTE   Subjective/Complaints:  No issues overnite, no left calf pain, discussed anticoagulant meds   ROS: +LLE pain with standing, +diarrhea-better, +cough-better. Denies fevers, chills, CP, SOB, abd pain, N/V/C  Objective:   No results found. Recent Labs    01/27/23 0618  WBC 4.2  HGB 10.8*  HCT 33.3*  PLT 186    Recent Labs    01/27/23 0618  NA 140  K 3.4*  CL 107  CO2 20*  GLUCOSE 117*  BUN 14  CREATININE 0.88  CALCIUM 9.1     Intake/Output Summary (Last 24 hours) at 01/27/2023 6153 Last data filed at 01/27/2023 0700 Gross per 24 hour  Intake 708 ml  Output --  Net 708 ml         Physical Exam: Vital Signs Blood pressure 139/63, pulse 70, temperature 97.7 F (36.5 C), temperature source Oral, resp. rate 18, height 4' 9.99" (1.473 m), weight 63.1 kg, SpO2 96 %.   General: No acute distress, laying in bed Mood and affect are appropriate, less tearful this week Heart: Regular rate and rhythm no rubs murmurs or extra sounds Lungs: Clear to auscultation, breathing unlabored, no rales, rhonchi, or wheezes Abdomen: Positive bowel sounds, soft nontender to palpation, nondistended Extremities: No clubbing, cyanosis, or edema, left calf tenderness mild and seems improved today, no swelling or erythema   Skin: No evidence of breakdown, no evidence of rash  Prior exam: MSK:      No apparent deformity.      Strength:                RUE: 5/5 SA, 5/5 EF, 5/5 EE, 5/5 WE, 5/5 FF, 5/5 FA                 LUE: 4/5 SA, 4/5 EF, 4/5 EE, 4/5 WE, 4/5 FF, 4/5 FA                 RLE: 5/5 HF, 5/5 KE, 5/5 DF, 5/5 EHL, 5/5 PF                 LLE:  4/5 HF, 4/5 KE, 4/5 DF, 4/5 EHL, 4/5 PF    Neurologic exam:  Cognition: AAO to person, place, time and event.  Language: Fluent, No substitutions or neoglisms. +Hoarse, breathy vocal quality. Names 3/3 objects correctly.   Mood: Pleasant affect, appropriate mood.   Sensation: To light touch intact in BL UEs and LEs   Coordination: No apparent tremors. No ataxia on FTN, HTS bilaterally.  Spasticity: MAS 0 in all extremities.     Assessment/Plan: 1. Functional deficits which require 3+ hours per day of interdisciplinary therapy in a comprehensive inpatient rehab setting. Physiatrist is providing close team supervision and 24 hour management of active medical problems listed below. Physiatrist and rehab team continue to assess barriers to discharge/monitor patient progress toward functional and medical goals  Care Tool:  Bathing    Body parts bathed by patient: Right arm, Left arm, Chest, Abdomen, Front perineal area, Buttocks, Right upper leg, Left upper leg, Face   Body parts bathed by helper: Left lower leg, Right lower leg     Bathing assist  Assist Level: Minimal Assistance - Patient > 75%     Upper Body Dressing/Undressing Upper body dressing   What is the patient wearing?: Pull over shirt    Upper body assist Assist Level: Set up assist    Lower Body Dressing/Undressing Lower body dressing      What is the patient wearing?: Pants, Incontinence brief     Lower body assist Assist for lower body dressing: Maximal Assistance - Patient 25 - 49%     Toileting Toileting    Toileting assist Assist for toileting: Minimal Assistance - Patient > 75%     Transfers Chair/bed transfer  Transfers assist     Chair/bed transfer assist level: Moderate Assistance - Patient 50 - 74%     Locomotion Ambulation   Ambulation assist      Assist level: Moderate Assistance - Patient 50 - 74% Assistive device: Hand held assist Max distance: 150ft   Walk 10 feet activity   Assist     Assist level: 2 helpers Assistive device: Hand held assist   Walk 50 feet activity   Assist Walk 50 feet with 2 turns activity did not occur: Safety/medical concerns         Walk 150 feet activity   Assist Walk 150 feet activity did  not occur: Safety/medical concerns         Walk 10 feet on uneven surface  activity   Assist Walk 10 feet on uneven surfaces activity did not occur: Safety/medical concerns         Wheelchair     Assist Is the patient using a wheelchair?: Yes Type of Wheelchair: Manual    Wheelchair assist level: Moderate Assistance - Patient 50 - 74% Max wheelchair distance: 150'    Wheelchair 50 feet with 2 turns activity    Assist        Assist Level: Moderate Assistance - Patient 50 - 74%   Wheelchair 150 feet activity     Assist      Assist Level: Moderate Assistance - Patient 50 - 74%   Blood pressure 139/63, pulse 70, temperature 97.7 F (36.5 C), temperature source Oral, resp. rate 18, height 4' 9.99" (1.473 m), weight 63.1 kg, SpO2 96 %.  Medical Problem List and Plan: 1. 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 metastatic             -patient may shower             -ELOS/Goals: 12-14 days, SPV PT/OT/SLP goals  -Continue CIR, d/c goal 02/01/23 2. Antithrombotics: -DVT/anticoagulation:  Pharmaceutical: Eliquis 5 mg BID>>inc to 10mg  BID x 7d (01/20/23-01/27/23) then resume 5mg  BID starting 01/27/23 @2000  -antiplatelet therapy: N/A - was on ASA and Plavix prior to stroke, now on Eliquis only, will be on 10mg  BID x7d then resume 5mg , Dr Julien Nordmann in agreement   3. LBP/Pain Management: Will continue tylenol 650mg  q6h PRN for now.  -order ben gay for Left calf pain-- changed to scheduled TID 01/25/23 -Lidoderm patch to neck QD 4. Mood/Behavior/Sleep: LCSW to follow for evaluation and support.                       --Melatonin 6mg  QHS PRN, Trazodone prn for insomnia.        -antipsychotic agents: N/A 5. Neuropsych/cognition: This patient is capable of making decisions on her own behalf. 6. Skin/Wound Care: Routine pressure relief measures.   7. Fluids/Electrolytes/Nutrition: Monitor I/O. Monitor weekly  labs mild hypoK=  will supplement x 2 d and repeat 2/23     Latest Ref Rng & Units 01/27/2023    6:18 AM 01/20/2023    8:38 AM 01/18/2023    6:15 AM  BMP  Glucose 70 - 99 mg/dL 117  111  104   BUN 8 - 23 mg/dL 14  12  11    Creatinine 0.44 - 1.00 mg/dL 0.88  0.86  0.84   Sodium 135 - 145 mmol/L 140  136  137   Potassium 3.5 - 5.1 mmol/L 3.4  3.4  4.0   Chloride 98 - 111 mmol/L 107  106  103   CO2 22 - 32 mmol/L 20  23  24    Calcium 8.9 - 10.3 mg/dL 9.1  9.1  9.2      8. HTN: Monitor BP TID--continue to hold Valsartan and HCTZ.              --SBP trending 100- 140 range. Avoid hypoperfusion   -01/26/23 BPs stable, cont to monitor Vitals:   01/23/23 1104 01/23/23 1919 01/24/23 0343 01/24/23 1432  BP: 133/68 (!) 155/71 (!) 142/75 136/75   01/24/23 1935 01/25/23 0306 01/25/23 1326 01/25/23 1938  BP: 123/73 (!) 143/73 (!) 140/74 119/70   01/26/23 0308 01/26/23 1311 01/26/23 2022 01/27/23 0424  BP: (!) 148/66 109/62 132/69 139/63      9. Stage IV  NSCLC w/lymphadenopathy/muscular nodules: Chemo to resume after discharge. --Per neurology, repeat MRI brain w/ and w/o in 6-8 weeks which would be ` last 2 weeks of March to continue to monitor for potential metastatic disease. Will need neuro outpt f/u 2-4 wks post discharge  10. GERD s/p esophageal dilatation: Continue Protonix 40mg  BID. Will add pepcid 20mg  BID also.  11. Anxiety/depression: On Paxil 40mg  daily w/xanax 0.5mg  QHS prn.   12. Hyperlipidemia: On Crestor 20mg  QD  13. Neck pain/Headaches: On PRN Fioricet 2 tabs Q6H, using consistently.  Will add Topamax 25 mg bid.              --lidocaine patch for local measures.              -- Flexeril 5 mg TID PRN for muscle tightness in neck --increased topamax to 50mg  BID- reassess for dose  increase in am  -01/25/23 improving, cont regimen  14. Horseness/L-VC paralysis : Has been ongoing since August-->a little worse.               -- SLP for swallow, speech              -- ISB   15.  Dizziness/nausea/double vision: R-beating nystagmus on exam. Vestibular eval and treat.  -01/18/23 was getting Zofran inpatient, ordered 4mg  ODT q8h PRN n/v; also has Compazine PRN  16. Persistent cough since CVA. Causing cervicalgia, HA's, and nausea/vomiting with PO intakes.              --Treat cough -rule out aspiration/post obstructive infection.                -- Duo nebs qid added w/tessalon perles prn --may need pulmonary involved if no better.  -01/18/23 CXR yesterday without active disease; seems better today, though poor inspiratory effort. Continue Duonebs, Tessalon 200mg  TID PRN, Robitussin PRN, and monitor  -01/19/23 lungs sound ok today, coughing spells seems to be with PO intake, monitor for now but no acute change needed today -01/25/23 CXR yesterday reassuring, cough improving, cont regimen  17. Anemia: Hgb down  to 10.5 on 01/10/23, stable at 10.7 on 01/20/23; monitor on weekly labs next 03/07/06  -Continue Folic acid 1mg  QD, multivitamins  18. Hx of rectal surgery, chronic constipation:  -01/18/23 LBM yesterday, but requests stool softeners given hx of rectal surgery. Ordered Miralax 17g QD + PRN, Colace 100mg  QD + PRN. Has Dulcolax supp PRN, Fleet's PRN.  -01/19/23 no BM in 2 days, doesn't like miralax; change miralax to PRN only, increase Colace to 100mg  BID+PRN, add Senna 8.6mg  QD -Given sorbitol x 1 on 01/20/23; D/c'd Colace, Senokot, and Miralax on 01/22/23, started dulcolax supp QD and Senokot-S 3 tabs BID on 01/22/23. -01/25/23 now with diarrhea; change Senokot-S to BID PRN, leave Dulcolax supp in place, monitor for now -01/26/23 diarrhea slowed/stopped, monitor for constipation since stool regimen has changed.  19. LLE pain: nursing reporting this after assessment this morning 01/18/23, per PT reports-- PT concerned for DVT, no significant swelling noted on exam this morning -01/18/23 ordered doppler U/S bilateral LEs to r/o DVT given hx of malignancy, continue above pain  meds -01/20/23 + DVT , increase Eliquis to 10mg  BID x 7d then resume 5mg  BID- may cont therapy  -01/25/23 scheduled [place] as above -01/26/23 much better pain control, cont to monitor    LOS: 10 days A FACE TO FACE EVALUATION WAS PERFORMED  Charlett [name] 01/27/2023, 8:52 AM

## 2023-01-27 NOTE — Progress Notes (Signed)
Speech Language Pathology Daily Session Note  Patient Details  Name: Patricia [name] MRN: [id number] Date of Birth: 28-Jan-1946  Today's Date: 01/27/2023 SLP Individual Time: 1222-4114 SLP Individual Time Calculation (min): 45 min  Short Term Goals: Week 2: SLP Short Term Goal 1 (Week 2): STG=LTG due to ELOS  Skilled Therapeutic Interventions: Skilled ST treatment focused on cognitive goals. Pt was greeted semi-reclined in bed on arrival and consuming an [place] ice without overt s/sx of aspiration. Pt transferred to wheelchair with stand pivot transfer and mod A due to L lean. Pt was transported to ortho gym for completion of a novel card game targeting memory, attention, and awareness goals. Pt completed task with overall mod fading to min A verbal cues in regards to recall of task instructions and awareness of errors. Carry over improved as task progressed. Pt sustained attention for 20 minute duration with sup A verbal redirection cues. Pt was returned to room and transferred to bed with min A and left with alarm activated and immediate needs within reach at end of session. Continue per current plan of care.      Pain  None/denied  Therapy/Group: Individual Therapy  [name] 01/27/2023, 3:55 PM

## 2023-01-27 NOTE — Progress Notes (Signed)
Patient ID: Patricia [name], female   DOB: Apr 27, 1946, 77 y.o.   MRN: [id number]  Family education arranged Wednesday 1-4 with pt's granddaughter. No additional questions or concerns.

## 2023-01-27 NOTE — Progress Notes (Signed)
Physical Therapy Session Note  Patient Details  Name: Patricia [name] MRN: [id number] Date of Birth: Oct 31, 1946  Today's Date: 01/27/2023 PT Individual Time: 1132-1200 PT Individual Time Calculation (min): 28 min   Short Term Goals: Week 1:  PT Short Term Goal 1 (Week 1): Pt will perform supine<>sit with min assist PT Short Term Goal 1 - Progress (Week 1): Met PT Short Term Goal 2 (Week 1): Pt will perform sit<>stands using LRAD with min assist PT Short Term Goal 2 - Progress (Week 1): Progressing toward goal PT Short Term Goal 3 (Week 1): Pt will perform bed<>chair transfers using LRAD with min assist PT Short Term Goal 3 - Progress (Week 1): Progressing toward goal PT Short Term Goal 4 (Week 1): Pt will ambulate at least 84ft using LRAD with mod assist of 1 (+2 w/c follow if needed) PT Short Term Goal 4 - Progress (Week 1): Met PT Short Term Goal 5 (Week 1): Pt will initiate stair navigation training PT Short Term Goal 5 - Progress (Week 1): Not met  Skilled Therapeutic Interventions/Progress Updates:    Pt received sitting upright in WC and agreeable to therapy. Pt transported to therapy gym dependently. Pt performed sit to stand transfer throughout session w/ CGA. Squat pivot transfers w/ Min A. Pt required mod vc for which UE to use when transferring from [facility] when doing a squat pivot.   Gait training: ~60ft w/ HHA x1 for the first 30 ft and HHA x2 for the last 40 ft. Pt demonstrated decreased postural control in standing. Mod vc required to instruct patient to find COM first before attempting to ambulate. Therapist facilitating mild R wt shift at hips to assist w/ L foot clearance. Pt demonstrated a posterior COM when ambulating and therapist cued pt to bring wt forward to prevent LOB. Pt returned to room dependently in [place]. Performed sit to supine transfer w/ supervision assist and vc to bring LLE up on the bed. Pt left w/ bed alarm on, call bell in reach and all needs met.   Therapy  Documentation Precautions:  Precautions Precautions: Fall, Other (comment) Precaution Comments: L hemiparesis, L lean Restrictions Weight Bearing Restrictions: No General:  Pain: pt denies any pain        Therapy/Group: Individual Therapy  Chaney Maclaren 01/27/2023, 4:53 PM

## 2023-01-27 NOTE — Progress Notes (Signed)
Patient ID: Patricia [name], female   DOB: 1946-03-01, 77 y.o.   MRN: [id number]  Rolling Walker ordered through [place].

## 2023-01-27 NOTE — Progress Notes (Signed)
Physical Therapy Session Note  Patient Details  Name: Patricia [name] MRN: [id number] Date of Birth: 07/19/1946  Today's Date: 01/27/2023 PT Individual Time: 2334-3568 PT Individual Time Calculation (min): 59 min   Short Term Goals: Week 1:  PT Short Term Goal 1 (Week 1): Pt will perform supine<>sit with min assist PT Short Term Goal 1 - Progress (Week 1): Met PT Short Term Goal 2 (Week 1): Pt will perform sit<>stands using LRAD with min assist PT Short Term Goal 2 - Progress (Week 1): Progressing toward goal PT Short Term Goal 3 (Week 1): Pt will perform bed<>chair transfers using LRAD with min assist PT Short Term Goal 3 - Progress (Week 1): Progressing toward goal PT Short Term Goal 4 (Week 1): Pt will ambulate at least 23ft using LRAD with mod assist of 1 (+2 w/c follow if needed) PT Short Term Goal 4 - Progress (Week 1): Met PT Short Term Goal 5 (Week 1): Pt will initiate stair navigation training PT Short Term Goal 5 - Progress (Week 1): Not met  Skilled Therapeutic Interventions/Progress Updates:  Patient supine in bed on entrance to room. Patient alert and agreeable to PT session.   Patient with no pain complaint at start of session.  Therapeutic Activity: Bed Mobility: Pt performed supine <> sit with ***. VC/ tc required for ***. Transfers: Pt performed sit<>stand and stand pivot transfers throughout session with ***. Provided verbal cues for***.  Gait Training:  Pt ambulated *** ft using *** with ***. Demonstrated ***. Provided vc/ tc for ***.  Wheelchair Mobility:  Pt propelled wheelchair *** feet with ***. Provided vc for ***.  Neuromuscular Re-ed: NMR facilitated during session with focus on***. Pt guided in ***. NMR performed for improvements in motor control and coordination, balance, sequencing, judgement, and self confidence/ efficacy in performing all aspects of mobility at highest level of independence.   Patient *** at end of session with brakes locked, ***  alarm set, and all needs within reach.   Therapy Documentation Precautions:  Precautions Precautions: Fall, Other (comment) Precaution Comments: L hemiparesis, L lean Restrictions Weight Bearing Restrictions: No General:   Vital Signs: Therapy Vitals Temp: 98.1 F (36.7 C) Pulse Rate: 66 Resp: 15 BP: 135/73 Patient Position (if appropriate): Lying Oxygen Therapy SpO2: 96 % O2 Device: Room Air Pain:  No pain related this session. Pt does complain of dizziness.  Therapy/Group: Individual Therapy  Alger Simons PT, DPT, CSRS 01/27/2023, 4:44 PM

## 2023-01-27 NOTE — Progress Notes (Signed)
Occupational Therapy Session Note  Patient Details  Name: Patricia [name] MRN: [id number] Date of Birth: 07/29/1946  Today's Date: 01/27/2023 OT Individual Time: 7711-6579 OT Individual Time Calculation (min): 60 min    Short Term Goals: Week 2:  OT Short Term Goal 1 (Week 2): STG=LTG d/t Pt ELOS  Skilled Therapeutic Interventions/Progress Updates:  Pt greeted supine in bed with MD and nurse present, pt agreeable to OT intervention. Session focus on BADL reeducation, functional mobility, dynamic standing balance, LLE NMR and decreasing overall caregiver burden.   Pt completed supine>sit to L side of bed with MIN A. Pt completed seated dressing tasks with pt able to don OH shirt with set- up assist and MOD A for LB dressing, pt needing cues to recall dressing LLE first and assist to thread LLE and pull pants to waist line in standing. [name] for sit>stand from EOB with HHA. Pt completed stand pivot to w/c to R side with MIN HHA.   Total A transport to gym in w/c for time mgmt. Worked on various therapeutic activities with an emphasis on LLE NMR, body awareness, dynamic standing balance and functional reaching.     Pt completed Sit>stand from EOM with RLE on step to provide NMR to LLE.pt presents with posterior lean and L lean needing MODA. Provided mirror for visual feedback. Pt instructed to reach for clothespins with LUE to challenge dynamic balance and allow for increased WB'ing onto LLE.   Worked on sit>Stands from [facility] with therapist seated in front of pt.  Pt able to stand to march RLE and LLE with BUE support with pt holding onto therapist shoulders. Pt continues to present with L lean/posterior lean needing up to MOD A for balance.   Pt completed functional ambulation ~ 70 ft with HHA on L side with overall MODA with +2 for chair follow.        Ended session with pt seated in w/c with all needs within reach.               Therapy Documentation Precautions:   Precautions Precautions: Fall, Other (comment) Precaution Comments: L hemiparesis, L lean Restrictions Weight Bearing Restrictions: No    Pain: No pain    Therapy/Group: Individual Therapy  Corinne Ports [facility] 01/27/2023, 12:09 PM

## 2023-01-27 NOTE — Progress Notes (Signed)
Patient ID: Patricia [name], female   DOB: May 21, 1946, 77 y.o.   MRN: [id number]  SW called pt's granddaughter, [name] to arrange family education. Left detailed VM.

## 2023-01-27 NOTE — Progress Notes (Addendum)
Patient ID: Patricia [name], female   DOB: [birth year], 77 y.o.   MRN: [id number] [facility] referral sent to [facility] Orders sent.

## 2023-01-28 NOTE — Progress Notes (Signed)
Pt's grandaughter, [name], had called the navigator to relay the concerns the pt is having about restarting treatment. [name] states that the pt is afraid to restart chemotherapy when she gets discharged because she is afraid she'll have another stroke. [name] also asked that if the pt decides to not restart chemotherapy "how long does she have". Navigator explained that that is a difficult question to answer, however navigator will pass the pt's concerns along to Dr Julien Nordmann. According to [facility], the pt is scheduled to be d/c'd to home from [facility] this weekend.  Navigator discussed the pts concerns with Dr Julien Nordmann face to face. Dr Julien Nordmann would like the pt to have a follow up appt with him after her discharge from the hospital to discuss her options.  Navigator called Heather back and informed her of Dr Ellan Lambert suggestion that the pt see him before making a decision about treatment. Navigator informed [name] that Dr Ellan Lambert first available appt is on 3/4 @ 8:15. [name] states she will be out of town that date, but that the pt's husband may be able to get her to her appt. [name] tells the Navigator that she will call tmw after she has spoken to the pt's husband and her husband as well. Navigator verbalized understanding.

## 2023-01-28 NOTE — Plan of Care (Signed)
  Problem: RH Toileting Goal: LTG Patient will perform toileting task (3/3 steps) with assistance level (OT) Description: LTG: Patient will perform toileting task (3/3 steps) with assistance level (OT)  Flowsheets (Taken 01/28/2023 0751) LTG: Pt will perform toileting task (3/3 steps) with assistance level: Minimal Assistance - Patient > 75% Note: downgraded due to change in POC for shorter LOS    Problem: RH Toilet Transfers Goal: LTG Patient will perform toilet transfers w/assist (OT) Description: LTG: Patient will perform toilet transfers with assist, with/without cues using equipment (OT) Flowsheets (Taken 01/28/2023 0751) LTG: Pt will perform toilet transfers with assistance level of: Minimal Assistance - Patient > 75% Note: downgraded due to change in POC for shorter LOS

## 2023-01-28 NOTE — Discharge Summary (Signed)
Physician Discharge Summary  Patient ID: Patricia [name] MRN: [id number] DOB/AGE: 1946/07/31 77 y.o.  Admit date: 01/17/2023 Discharge date: 02/05/2023  Discharge Diagnoses:  Principal Problem:   Embolic stroke [facility]) Active Problems:   GERD (gastroesophageal reflux disease)   Dysphagia   Primary hypertension   Hoarseness   Adenocarcinoma of left lung, stage 4 (HCC)   Pain of left lower extremity   Paroxysmal cough   DVT of lower extremity, bilateral (HCC)   Discharged Condition: stable  Significant Diagnostic Studies: VAS [name] LOWER EXTREMITY VENOUS (DVT)  Result Date: 01/21/2023  Lower Venous DVT Study Patient Name:  Patricia [name] Syme  Date of Exam:   01/20/2023 Medical Rec #: [id number]          Accession #:    [id number] Date of Birth: 10/24/1946           Patient Gender: F Patient Age:   77 years Exam Location:  [facility] Procedure:      VAS [name] LOWER EXTREMITY VENOUS (DVT) Referring Phys: Glorian Mcdonell --------------------------------------------------------------------------------  Indications: Pain.  Risk Factors: Immobility. Comparison Study: No prior study on file Performing Technologist: Sharion Dove RVS  Examination Guidelines: A complete evaluation includes B-mode imaging, spectral Doppler, color Doppler, and power Doppler as needed of all accessible portions of each vessel. Bilateral testing is considered an integral part of a complete examination. Limited examinations for reoccurring indications may be performed as noted. The reflux portion of the exam is performed with the patient in reverse Trendelenburg.  +---------+---------------+---------+-----------+----------+--------------+ RIGHT    CompressibilityPhasicitySpontaneityPropertiesThrombus Aging +---------+---------------+---------+-----------+----------+--------------+ CFV      Full           Yes      Yes                                  +---------+---------------+---------+-----------+----------+--------------+ SFJ      Full                                                        +---------+---------------+---------+-----------+----------+--------------+ FV Prox  Full                                                        +---------+---------------+---------+-----------+----------+--------------+ FV Mid   Full                                                        +---------+---------------+---------+-----------+----------+--------------+ FV DistalNone                                                        +---------+---------------+---------+-----------+----------+--------------+ PFV      Full                                                        +---------+---------------+---------+-----------+----------+--------------+  POP      None           Yes      Yes                                 +---------+---------------+---------+-----------+----------+--------------+ PTV      None                                         Acute          +---------+---------------+---------+-----------+----------+--------------+ PERO     None                                         Acute          +---------+---------------+---------+-----------+----------+--------------+   +---------+---------------+---------+-----------+----------+--------------+ LEFT     CompressibilityPhasicitySpontaneityPropertiesThrombus Aging +---------+---------------+---------+-----------+----------+--------------+ CFV      Full           Yes      Yes                                 +---------+---------------+---------+-----------+----------+--------------+ SFJ      Full                                                        +---------+---------------+---------+-----------+----------+--------------+ FV Prox  Full                                                         +---------+---------------+---------+-----------+----------+--------------+ FV Mid   Full                                                        +---------+---------------+---------+-----------+----------+--------------+ FV DistalNone                                         Acute          +---------+---------------+---------+-----------+----------+--------------+ PFV      Full                                                        +---------+---------------+---------+-----------+----------+--------------+ POP      Full           Yes      Yes                                 +---------+---------------+---------+-----------+----------+--------------+ PTV  None                                         Acute          +---------+---------------+---------+-----------+----------+--------------+ PERO     None                                         Acute          +---------+---------------+---------+-----------+----------+--------------+ Gastroc  Full                                                        +---------+---------------+---------+-----------+----------+--------------+     Summary: RIGHT: - Findings consistent with acute deep vein thrombosis involving the right popliteal vein, right posterior tibial veins, right peroneal veins, and distal right femoral vein.  LEFT: - Findings consistent with acute deep vein thrombosis involving the left posterior tibial veins, and left peroneal veins.  *See table(s) above for measurements and observations. Electronically signed by Orlie Pollen on 01/21/2023 at 12:09:25 PM.    Final    DG Chest 2 View  Result Date: 01/20/2023 CLINICAL DATA:  Cough EXAM: CHEST - 2 VIEW COMPARISON:  01/17/2023 FINDINGS: Low lung volumes. No acute airspace disease or pleural effusion. Normal cardiomediastinal silhouette. Aortic atherosclerosis. No pneumothorax. IMPRESSION: No active cardiopulmonary disease. Low lung volumes. Electronically Signed    By: Donavan Foil M.D.   On: 01/20/2023 20:16   DG CHEST PORT 1 VIEW  Result Date: 01/17/2023 CLINICAL DATA:  Dyspnea, weakness EXAM: PORTABLE CHEST 1 VIEW COMPARISON:  03/26/2005 FINDINGS: No focal consolidation. No pleural effusion or pneumothorax. Heart and mediastinal contours are unremarkable. No acute osseous abnormality. IMPRESSION: No active disease. Electronically Signed   By: Kathreen Devoid M.D.   On: 01/17/2023 14:08    Labs:  Basic Metabolic Panel: Recent Labs  Lab 01/31/23 0722 02/03/23 0945 02/05/23 0732  NA 140 138  --   K 3.8 3.3* 3.1*  CL 111 105  --   CO2 21* 21*  --   GLUCOSE 110* 115*  --   BUN 16 14  --   CREATININE 0.81 0.90  --   CALCIUM 9.3 9.6  --     CBC:    Latest Ref Rng & Units 02/03/2023    9:45 AM 01/27/2023    6:18 AM 01/20/2023    8:38 AM  CBC  WBC 4.0 - 10.5 K/uL 8.3  4.2  4.9   Hemoglobin 12.0 - 15.0 g/dL 12.3  10.8  10.7   Hematocrit 36.0 - 46.0 % 37.0  33.3  33.9   Platelets 150 - 400 K/uL 281  186  275      CBG: No results for input(s): "GLUCAP" in the last 168 hours.  Brief HPI:   Patricia [name] is a 77 y.o. female with history of GERD, dysphagia s/p esophageal dilatation, left vocal cord paralysis, stage IV NSL with extensive adenopathy diagnosed 11/2022 and started on chemo in Jan.  She was admitted on 01/10/2023 with 2-week history of headaches that progressed to acute onset of LLE weakness and vertigo with difficulty walking on day of admission.  MRI brain done revealing small scattered acute and subacute infarcts in bilateral cerebral hemispheres and enhancing nodule right frontal cortex likely subacute infarct with recommendations repeat films in 6 to 8 weeks to rule out malignancy.    Neurology felt the stroke was embolic or due to hypercoagulable state due to cancer.  Her case was discussed with Dr. Julien Nordmann and she was started on Eliquis on 02/06.  MBS showed normal swallow function therefore she was maintained on dysphagia 3  with thin liquids limited distraction.  She continued to be limited by left-sided weakness with left lean, decreased awareness of deficits, poor attention was verbosity, cognitive deficits as well as persistent headaches.  CIR was recommended due to functional decline.   Hospital Course: Patricia [name] was admitted to rehab 01/17/2023 for inpatient therapies to consist of PT, ST and OT at least three hours five days a week. Past admission physiatrist, therapy team and rehab RN have worked together to provide customized collaborative inpatient rehab. She continued to have proximal coughing spells which resulted in worsening her headaches as well as caused her to vomit.  Chest x-ray done showed no evidence of pneumonia or aspiration.  Tessalon Perles was added to help manage symptoms.    At admission she was noted to be using  Fioricet frequently and reported constant headaches with minimal relief therefore Topamax was added at admission and titrated up to 100 mg twice daily with improvement in symptoms.  Fioricet was tapered to 2.5 mg 3 times daily as needed by discharge.  Speech has been following for dysphagia treatment and recommended use of provide To prevent aspiration of thin liquids.  Bowel program has been augmented to help manage constipation.  Dulcolax suppository was offered but she has refused this frequently.  Blood pressures were monitored on TID basis and has been variable.  HCTZ was resumed on 02/25 with resultant hypokalemia.  She was started on K-Dur for supplementation and this was titrated to 3 times daily prior to discharge.  Recommend recheck BMET in next 5 to 7 days to monitor for stability.  She continues to have issues with reflux symptoms therefore Pepcid twice daily was added to help manage symptoms.  Therapy reported LLE pain with activity and express concerns of DVT. On 02/10 and BLE Dopplers done due to history of malignancy.  Dopplers revealed RLE greater than LLE DVTs and  Eliquis was increased to treatment dose of 10 mg twice daily x 7 days then prior dose resumed.  Nutritional supplements were offered during her stay and p.o. intake has improved overall.  She has had improvement in activity tolerance and has progressed to contact-guard to supervision level at discharge.  She will continue to receive follow-up home health PT, OT and ST by [facility] after discharge   Rehab course: During patient's stay in rehab weekly team conferences were held to monitor patient's progress, set goals and discuss barriers to discharge. At admission, patient required close to mod assist with mobility and min to max assist with ADL tasks.  She exhibited cognitive deficits impacting memory, language, cognitive flexibility, executive functioning and visual-spatial skills. She  has had improvement in activity tolerance, balance, postural control as well as ability to compensate for deficits. He/She has had improvement in functional use LUE  and LLE as well as improvement in awareness..  She requires supervision to contact-guard assist to complete ADL tasks. She requires contact-guard assist for sit to stand transfers and contact-guard to min assist to ambulate 180  feet with rolling walker.  She requires verbal cues for safe use of AE as well as sequencing and gait pattern. She has made slow function cognitive gains and requires min verbal cues to complete functional and familiar tasks safely.  She she is tolerating regular textures with thin liquids with minimal overt signs or symptoms of aspiration with intermittent supervision needed for safe swallow strategies.  Her slums score has improved from 13/30 to 17/30. Family education has been completed with husband.    Disposition: Home  Diet: Regular  Special Instructions: Recommend repeat be met in 5 to 7 days to monitor potassium levels. Will need follow-up MRI brain in 2 to 3 weeks per neurology recommendations.  Discharge  Instructions     Ambulatory referral to Neurology   Complete by: As directed    An appointment is requested in approximately: 3-4 weeks   Ambulatory referral to Physical Medicine Rehab   Complete by: As directed    Hospital follow up      Allergies as of 02/05/2023       Reactions   Codeine Nausea Only        Medication List     STOP taking these medications    meclizine 25 MG tablet Commonly known as: ANTIVERT   naproxen sodium 220 MG tablet Commonly known as: ALEVE   valsartan-hydrochlorothiazide 160-25 MG tablet Commonly known as: DIOVAN-HCT   Vitamin D3 125 MCG (5000 UT) capsule Generic drug: Cholecalciferol       TAKE these medications    ALPRAZolam 0.5 MG tablet Commonly known as: XANAX TAKE (1) TABLET BY MOUTH AT BEDTIME AS NEEDED FOR SLEEP. What changed: See the new instructions.   apixaban 5 MG Tabs tablet Commonly known as: ELIQUIS Take 1 tablet (5 mg total) by mouth 2 (two) times daily.   benzonatate 100 MG capsule Commonly known as: TESSALON Take 1 capsule (100 mg total) by mouth 3 (three) times daily.   butalbital-acetaminophen-caffeine 50-325-40 MG tablet--Rx# 14 tablets Commonly known as: FIORICET Take 1/2 tablet by mouth every 6 (six) hours as needed for headache. What changed: how much to take   cyclobenzaprine 5 MG tablet Commonly known as: FLEXERIL Take 1 tablet (5 mg total) by mouth 3 (three) times daily as needed for muscle spasms (neck/shoulder pain).   docusate sodium 100 MG capsule Commonly known as: COLACE Take 1 capsule (100 mg total) by mouth 2 (two) times daily.   famotidine 20 MG tablet Commonly known as: PEPCID Take 1 tablet (20 mg total) by mouth 2 (two) times daily.   folic acid 1 MG tablet Commonly known as: FOLVITE Take 1 tablet (1 mg total) by mouth daily.   hydrochlorothiazide 25 MG tablet Commonly known as: HYDRODIURIL Take 1 tablet (25 mg total) by mouth daily.   lansoprazole 30 MG capsule Commonly  known as: PREVACID Take one po qd for acid reflux   lidocaine 5 % Commonly known as: LIDODERM Place 1 patch onto the skin daily. Remove & Discard patch within 12 hours or as directed by MD   multivitamin with minerals Tabs tablet Take 1 tablet by mouth daily.   Muscle Rub 10-15 % Crea Apply 1 Application topically 3 (three) times daily to bilateral calves   PARoxetine 40 MG tablet Commonly known as: PAXIL TAKE 1 TABLET BY MOUTH EVERY DAY IN THE MORNING What changed: See the new instructions.   potassium chloride SA 20 MEQ tablet Commonly known as: KLOR-CON M Take 1 tablet (20 mEq total) by  mouth 2 (two) times daily.   prochlorperazine 10 MG tablet Commonly known as: COMPAZINE Take 1 tablet (10 mg total) by mouth every 6 (six) hours as needed for nausea or vomiting.   rosuvastatin 20 MG tablet Commonly known as: CRESTOR Take 1 tablet (20 mg total) by mouth daily.   senna-docusate 8.6-50 MG tablet Commonly known as: Senokot-S Take 3 tablets by mouth 2 (two) times daily.   topiramate 100 MG tablet Commonly known as: TOPAMAX Take 1 tablet (100 mg total) by mouth 2 (two) times daily.        Follow-up Information     Curt Bears, MD Follow up on 02/11/2023.   Specialty: Oncology Why: Be there at 8 am for labs/appt/infusion Contact information: [place] [address] [phone]         Charlett [name], MD Follow up.   Specialty: Physical Medicine and Rehabilitation Why: office will call you with follow up appointment Contact information: [place] [address] [phone]         Hoskins, Montgomery, NP. Call.   Specialty: Family Medicine Why: Call in 1-2 days for post hospital follow up Contact information: [address] [place]  [address] [phone]         [facility] Follow up.   Why: office will call you with follow up appointment Contact  information: [address]     [place] [phone]                Signed: Bary Leriche 02/05/2023, 9:38 AM

## 2023-01-28 NOTE — Progress Notes (Signed)
PPhysical Therapy Session Note  Patient Details  Name: Patricia [name] MRN: [id number] Date of Birth: 04/04/1946  Today's Date: 01/28/2023 PT Individual Time: [phone] PT Individual Time Calculation (min): 78 min   Short Term Goals: Week 2:  PT Short Term Goal 1 (Week 2): = to LTGs based on ELOS  Skilled Therapeutic Interventions/Progress Updates:    Pt received sitting in w/c with her husband, [name], present and pt agreeable to therapy session. Reports need to use bathroom. Sit>stand w/c>L HHA with pt pushing up with both hands from seat and still having minor posterior lean upon coming to stand but is able to correct it with decreased assistance today. Gait training ~69ft x2 in/out bathroom using L HHA with min assist for balance and 1x heavy mod assist due to posterior lean when pt turning to look at something quickly and not focusing on stepping her feet while turning - pt with poor awareness of this LOB. Standing with min assist for balance due to minor posterior lean while pt performed LB clothing management without assist. Continent of bladder and performed seated peri-care without assist. Standing hand hygiene at sink with CGA for steadying - pt leaning hips on sink.  Pt's husband present throughout remainder of session for initiation of verbal education.  Gait training ~175ft to main therapy gym using L HHA with min assist for balance. Pt demonstrating the following gait deviations with therapist providing the described cuing and facilitation for improvement:  - continues to have decreased L LE foot clearance and step length requiring cuing to correct - this ultimately results in L lean/LOB if not corrected with verbal cuing - continues to have excessively large R LE step length especially in comparison to L LE step length causing worsening L anterior LOB  - overall balance instability   Provided pt with RW.   Sit<>stands in gym with pt continuing to have L posterolateral lean with  1x L posterior LOB returning to sitting on mat. Therapist providing extensive education on only placing 1 hand on RW when coming to stand and ensuring L foot is back and out (to widen her BOS and ensure L foot is underneath her fully) prior to initiating coming to stand - pt with poor recall of this training and would benefit from reinforcement.  Gait training 72ft x2 using RW (navigating through 2 doorways) with min assist for balance. Pt demonstrating the following gait deviations with therapist providing the described cuing and facilitation for improvement:  - using an AD requires increased divided/alternating attention to focus on it and her balance while stepping LEs correctly during gait, which is a challenge - pt noticed to bump RW into objects on R side (such as doorframe or mat), likely due to L inattention not realizing the amount of space available on the L and poor motor planning/navigating AD - continues to have poor L LE foot clearance and step length - during 2nd walk pt says outloud to herself "left foot, right foot," which improves her L LE foot clearance and step length while promoting sustained attention on gait mechanics - continues with L lean and poor R weight shift during R stance Reinforced education on proper AD management and staying close to it when turning with min improvmenet in 2nd gait  Pt noticed to be blinking eyes and states she is seeing black spots in her vision for ~1 week that started while she was up here on CIR. And reports she is still having horizontal diplopia.  Dynamic gait training task using RW with challenge of collecting horseshoes one-at-a-time from table and moving them to basketball goal with focus on proper AD management while prioritizing the attention to her balance and LE stepping - requires CGA/min assist throughout - primarily min assist when turning due to minor posterior lean/LOB as well as pt tendency to cross step her feet when  turning.  Dynamic gait training with focus on R weight shift during R stance to improve L step length via biasing the impairment by having +2 assist pull her towards the L using yellow theraband while therapist provides min/mod assist for balance and L HHA - pt with poor ability to comprehend the goal of the task despite demonstration resulting in limited to no ability to weight shift R against the resistance - need to break down the task and perform it at a slower speed to be more successful.   Gait training ~142ft back to her room using RW with consistent min assist for balance at this point due to worsening L lateral lean with poorer L LE foot clearance and step length - therapist providing persistent cuing for improved L LE step length throughout while not taking an excessively large step with R LE.  At end of session, pt left seated in w/c with needs in reach, her family present, and chair alarm on.   Therapy Documentation Precautions:  Precautions Precautions: Fall, Other (comment) Precaution Comments: L hemiparesis, L lean Restrictions Weight Bearing Restrictions: No   Pain:  Reports headache pain at beginning of session - nurse notified and present for medication administration.    Therapy/Group: Individual Therapy  Tawana Scale , PT, DPT, NCS, CSRS 01/28/2023, 12:23 PM

## 2023-01-28 NOTE — Progress Notes (Signed)
Patient ID: Patricia [name], female   DOB: 01-05-1946, 77 y.o.   MRN: [id number]  [facility] ordered through [place].

## 2023-01-28 NOTE — Progress Notes (Signed)
PROGRESS NOTE   Subjective/Complaints:  No issues overnite   ROS: +LLE pain with standing, +diarrhea-better, +cough-better. Denies fevers, chills, CP, SOB, abd pain, N/V/C  Objective:   No results found. Recent Labs    01/27/23 0618  WBC 4.2  HGB 10.8*  HCT 33.3*  PLT 186     Recent Labs    01/27/23 0618  NA 140  K 3.4*  CL 107  CO2 20*  GLUCOSE 117*  BUN 14  CREATININE 0.88  CALCIUM 9.1      Intake/Output Summary (Last 24 hours) at 01/28/2023 0848 Last data filed at 01/27/2023 1804 Gross per 24 hour  Intake 358 ml  Output --  Net 358 ml         Physical Exam: Vital Signs Blood pressure (!) 144/81, pulse 65, temperature 97.8 F (36.6 C), resp. rate 17, height 4' 9.99" (1.473 m), weight 63.1 kg, SpO2 97 %.   General: No acute distress, laying in bed Mood and affect are appropriate, less tearful this week Heart: Regular rate and rhythm no rubs murmurs or extra sounds Lungs: Clear to auscultation, breathing unlabored, no rales, rhonchi, or wheezes Abdomen: Positive bowel sounds, soft nontender to palpation, nondistended Extremities: No clubbing, cyanosis, or edema, left calf tenderness mild and seems improved today, no swelling or erythema   Skin: No evidence of breakdown, no evidence of rash  Prior exam: MSK:      No apparent deformity.      Strength:                RUE: 5/5 SA, 5/5 EF, 5/5 EE, 5/5 WE, 5/5 FF, 5/5 FA                 LUE: 4/5 SA, 4/5 EF, 4/5 EE, 4/5 WE, 4/5 FF, 4/5 FA                 RLE: 5/5 HF, 5/5 KE, 5/5 DF, 5/5 EHL, 5/5 PF                 LLE:  4/5 HF, 4/5 KE, 4/5 DF, 4/5 EHL, 4/5 PF    Neurologic exam:  Cognition: AAO to person, place, time and event.  Language: Fluent, No substitutions or neoglisms. +Hoarse, breathy vocal quality.   Mood: Pleasant affect, appropriate mood.  Sensation: To light touch intact in BL UEs and LEs    Spasticity: MAS 0 in all extremities.      Assessment/Plan: 1. Functional deficits which require 3+ hours per day of interdisciplinary therapy in a comprehensive inpatient rehab setting. Physiatrist is providing close team supervision and 24 hour management of active medical problems listed below. Physiatrist and rehab team continue to assess barriers to discharge/monitor patient progress toward functional and medical goals  Care Tool:  Bathing    Body parts bathed by patient: Right arm, Left arm, Chest, Abdomen, Front perineal area, Buttocks, Right upper leg, Left upper leg, Face   Body parts bathed by helper: Left lower leg, Right lower leg     Bathing assist Assist Level: Minimal Assistance - Patient > 75%     Upper Body Dressing/Undressing Upper body dressing   What  is the patient wearing?: Pull over shirt    Upper body assist Assist Level: Set up assist    Lower Body Dressing/Undressing Lower body dressing      What is the patient wearing?: Pants     Lower body assist Assist for lower body dressing: Moderate Assistance - Patient 50 - 74%     Toileting Toileting    Toileting assist Assist for toileting: Contact Guard/Touching assist     Transfers Chair/bed transfer  Transfers assist     Chair/bed transfer assist level: Minimal Assistance - Patient > 75% (stand pivot with HHA)     Locomotion Ambulation   Ambulation assist      Assist level: Moderate Assistance - Patient 50 - 74% Assistive device: Hand held assist Max distance: 1109ft   Walk 10 feet activity   Assist     Assist level: 2 helpers Assistive device: Hand held assist   Walk 50 feet activity   Assist Walk 50 feet with 2 turns activity did not occur: Safety/medical concerns         Walk 150 feet activity   Assist Walk 150 feet activity did not occur: Safety/medical concerns         Walk 10 feet on uneven surface  activity   Assist Walk 10 feet on uneven surfaces activity did not occur: Safety/medical  concerns         Wheelchair     Assist Is the patient using a wheelchair?: Yes Type of Wheelchair: Manual    Wheelchair assist level: Moderate Assistance - Patient 50 - 74% Max wheelchair distance: 150'    Wheelchair 50 feet with 2 turns activity    Assist        Assist Level: Moderate Assistance - Patient 50 - 74%   Wheelchair 150 feet activity     Assist      Assist Level: Moderate Assistance - Patient 50 - 74%   Blood pressure (!) 144/81, pulse 65, temperature 97.8 F (36.6 C), resp. rate 17, height 4' 9.99" (1.473 m), weight 63.1 kg, SpO2 97 %.  Medical Problem List and Plan: 1. 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 metastatic             -patient may shower             -ELOS/Goals: 12-14 days, SPV PT/OT/SLP goals  -Continue CIR, d/c goal 02/01/23 2. Antithrombotics: -DVT/anticoagulation:  Pharmaceutical: Eliquis 5 mg BID>>inc to 10mg  BID x 7d (01/20/23-01/27/23) then resume 5mg  BID starting 01/27/23 @2000  -antiplatelet therapy: N/A - was on ASA and Plavix prior to stroke, now on Eliquis only, will be on 10mg  BID x7d then resume 5mg , Dr Julien Nordmann in agreement   3. LBP/Pain Management: Will continue tylenol 650mg  q6h PRN for now.  -order ben gay for Left calf pain-- changed to scheduled TID 01/25/23 -Lidoderm patch to neck QD 4. Mood/Behavior/Sleep: LCSW to follow for evaluation and support.                       --Melatonin 6mg  QHS PRN, Trazodone prn for insomnia.        -antipsychotic agents: N/A 5. Neuropsych/cognition: This patient is capable of making decisions on her own behalf. 6. Skin/Wound Care: Routine pressure relief measures.   7. Fluids/Electrolytes/Nutrition: Monitor I/O. Monitor weekly labs mild hypoK= will supplement x 2 d and repeat 2/23     Latest Ref Rng & Units 01/27/2023    6:18  AM 01/20/2023    8:38 AM 01/18/2023    6:15 AM  BMP  Glucose 70 - 99 mg/dL 117  111  104   BUN 8 - 23  mg/dL 14  12  11    Creatinine 0.44 - 1.00 mg/dL 0.88  0.86  0.84   Sodium 135 - 145 mmol/L 140  136  137   Potassium 3.5 - 5.1 mmol/L 3.4  3.4  4.0   Chloride 98 - 111 mmol/L 107  106  103   CO2 22 - 32 mmol/L 20  23  24    Calcium 8.9 - 10.3 mg/dL 9.1  9.1  9.2      8. HTN: Monitor BP TID--continue to hold Valsartan and HCTZ.              --SBP trending 100- 140 range. Avoid hypoperfusion   -01/26/23 BPs stable, cont to monitor Vitals:   01/24/23 1432 01/24/23 1935 01/25/23 0306 01/25/23 1326  BP: 136/75 123/73 (!) 143/73 (!) 140/74   01/25/23 1938 01/26/23 0308 01/26/23 1311 01/26/23 2022  BP: 119/70 (!) 148/66 109/62 132/69   01/27/23 0424 01/27/23 1358 01/27/23 1947 01/28/23 0355  BP: 139/63 135/73 115/76 (!) 144/81      9. Stage IV  NSCLC w/lymphadenopathy/muscular nodules: Chemo to resume after discharge. --Per neurology, repeat MRI brain w/ and w/o in 6-8 weeks which would be ` last 2 weeks of March to continue to monitor for potential metastatic disease. Will need neuro outpt f/u 2-4 wks post discharge  10. GERD s/p esophageal dilatation: Continue Protonix 40mg  BID. Will add pepcid 20mg  BID also.  11. Anxiety/depression: On Paxil 40mg  daily w/xanax 0.5mg  QHS prn.   12. Hyperlipidemia: On Crestor 20mg  QD  13. Neck pain/Headaches: On PRN Fioricet 2 tabs Q6H, using consistently.  Will add Topamax 25 mg bid.              --lidocaine patch for local measures.              -- Flexeril 5 mg TID PRN for muscle tightness in neck --increased topamax to 50mg  BID- reassess for dose  increase in am  -01/25/23 improving, cont regimen  14. Horseness/L-VC paralysis : Has been ongoing since August-->a little worse.               -- SLP for swallow, speech              -- ISB   15. Dizziness/nausea/double vision: R-beating nystagmus on exam. Vestibular eval and treat.  -01/18/23 was getting Zofran inpatient, ordered 4mg  ODT q8h PRN n/v; also has Compazine PRN  16. Persistent cough since  CVA. Causing cervicalgia, HA's, and nausea/vomiting with PO intakes.              --Treat cough -rule out aspiration/post obstructive infection.                -- Duo nebs qid added w/tessalon perles prn Improving , has lung ca so would expect some cough   17. Anemia: Hgb down to 10.5 on 01/10/23, stable at 10.7 on 01/20/23; monitor on weekly labs next 03/27/61  -Continue Folic acid 1mg  QD, multivitamins  18. Hx of rectal surgery, chronic constipation:  -01/18/23 LBM yesterday, but requests stool softeners given hx of rectal surgery. Ordered Miralax 17g QD + PRN, Colace 100mg  QD + PRN. Has Dulcolax supp PRN, Fleet's PRN.  -01/19/23 no BM in 2 days, doesn't like miralax; change miralax to PRN only, increase Colace  to 100mg  BID+PRN, add Senna 8.6mg  QD -Given sorbitol x 1 on 01/20/23; D/c'd Colace, Senokot, and Miralax on 01/22/23, started dulcolax supp QD and Senokot-S 3 tabs BID on 01/22/23. -01/25/23 now with diarrhea; change Senokot-S to BID PRN, leave Dulcolax supp in place, monitor for now -2/20, BMx 2 on 2/19- change dulc supp to prn  19. LLE pain: nursing reporting this after assessment this morning 01/18/23, per PT reports-- PT concerned for DVT, no significant swelling noted on exam this morning -01/18/23 ordered doppler U/S bilateral LEs to r/o DVT given hx of malignancy, continue above pain meds -01/20/23 + DVT , increase Eliquis to 10mg  BID x 7d then resume 5mg  BID- may cont therapy  -01/25/23 scheduled [place] as above -01/28/23 much better pain control, cont to monitor    LOS: 11 days A FACE TO FACE EVALUATION WAS PERFORMED  Charlett [name] 01/28/2023, 8:48 AM

## 2023-01-28 NOTE — Progress Notes (Signed)
Given scheduled medication this morning. Room was dark patient endorsed as waking up seeing white/light spots in field of vision.

## 2023-01-28 NOTE — Progress Notes (Signed)
Occupational Therapy Session Note  Patient Details  Name: MARLET KORTE MRN: [id number] Date of Birth: [birth year]  Today's Date: 01/28/2023 OT Individual Time: [phone] OT Individual Time Calculation (min): 74 min  OT Individual Time: 1100-1200 OT Individual Time Calculation (min): 60 min   Short Term Goals: Week 1:  OT Short Term Goal 1 (Week 1): The pt will transfer to all surfaces with MinA at 95% safe OT Short Term Goal 1 - Progress (Week 1): Progressing toward goal OT Short Term Goal 2 (Week 1): The pt will complete LB dressing with MinA at 95% safe OT Short Term Goal 2 - Progress (Week 1): Progressing toward goal OT Short Term Goal 3 (Week 1): The pt will tolerate >45 minutes of activity with minimal rest breaks @ 95% safe OT Short Term Goal 3 - Progress (Week 1): Progressing toward goal OT Short Term Goal 4 (Week 1): The pt will complete toileting with MinA by incorporating the grab bars  @ 95% safe OT Short Term Goal 4 - Progress (Week 1): Progressing toward goal Week 2:  OT Short Term Goal 1 (Week 2): STG=LTG d/t Pt ELOS  Skilled Therapeutic Interventions/Progress Updates:     AM Session: Pt received semi-reclined in bed with husband and nursing staff present in room providing morning medications and topical pain medication. Pt reporting 2/10 pain in LLE- OT offering rest breaks and repositioning. Pt politely denying need for shower this AM reporting she performed sponge bath seated on BSC and donned clean clothes following.   Focused beginning of session on discussing DME needs and d/c planning. Educated Pt on fall prevention and home set-up to increase accessibility with pt and husband receptive to education. Pt's husband reporting ramp has been built at house and home has been rearranged to accommodate hospital bed. Discussed shower safety and need for shower seat to increase safety- husband reporting they have family loaning them a shower chair and that their walk-in shower  is equip with grab bars and a [place]. Reinforced need for Pt to have assistance with shower transfer and LB bathing with 24/7 supervision to increase Pt safety.   Pt transitioned to EOB supervision using bed features with HOB elevated. Pt sit>stand min HHA ambulated to sink HHA. Dynamic standing balance challenge during grooming/hygiene tasks standing at sink. Facilitated Pt finding COG using external visual, tactile, and VB cueing "weight in toes" and "hips towards sink" with Pt able to maintain balance ~5 minutes with min-mod A without UE support transitioning to intermittent CGA with single UE support. Pt ambulated to wc min HHA with mod cueing for safety, weight shifting, and LLE awareness.   Pt transported to therapy gym total A in wc for time management and energy conservation.   Pt completed dynamic standing balance task to facilitate anterior weight shifting, increasing body awareness, and LB strength for BADLs. Posterior reaching incorporated into task with squigz placed on mat behind Pt and mirror placed anteriorly infront of Pt. Pt able to reach posteriorly to reach squigz and push them onto mirror with min A 6 reps x 3 trials with min A and max cueing. Pt required rest breaks between and following trials.   Pt completed sit>stands with 3.3 lb ball to work on dynamic standing balance and LB strength required for toileting and LB dressing. Pt able to complete sit>stand x3 with min A and max tactile cueing for anterior weight shifting "nose over toes" and to locate COG in standing.   Pt stand step min  HHA to wc. Transported back to room total A in wc. Requested to return to bed min HHA to EOB. EOB>supine supervision with cueing on technique to improve positioning in bed. Pt was left resting in bed with call bell in reach, bed alarm on, husband present in room, and all needs met.   PM Session: Pt received semi-reclined in bed presenting to be in good spirits and receptive to skilled OT session. Pt  reporting her head ache from this AM is no longer present. Pt politely refusing shower this AM  Pt transitioned to EOB using bed features and HOB elevated with supervision. Dynamic sitting balance challenge with donning shoes and socks. Pt able to donn R sock CGA. Increased assistance required donning L sock/shoe d/t posterior LOB noted when lifting L foot off ground requiring min A to lift trunk to center. Sit>stand ambulating to wc min HHA with mod cueing for weight shifting, step length, and COG.   Pt transported total A for time management and energy conservation.   Pt completed 2x2 minutes seated beach ball volley EOM using 3 lb weighted bar to address dynamic sitting balance, trunk control, and UE strength for seated BADL tasks. Pt required mod tactile and VB cueing to bring trunk forward sitting EOM and utilize BUEs to "punch" weighted bar forward. Rest break provided between and following trials.   Pt completed sit>stand x6 with anterior BUE chest press using 5 lb weighted ball to facilitate anterior weight shifting, increase static/dynamic standing balance, and body awareness. Max tactile and VB cueing provided to bring weight into toes and trunk forward. Pt reporting fear of falling when weight shifting forward with education and therapeutic motivation provided to ensure Pt safety. Utilized mirror during second trial to provide visual feedback and increased awareness to LLE positioning with noted improvement.   Pt completed modified crunches EOM using 3 lb weighted bar to facilitate increased trunk control and awareness. Pt able to complete 8x3 reps with max VB cueing required for technique and tactile cueing provided to increase muscle engagement, control, and positioning.   Pt completed functional mobility training with HHA 64ft x2 trials with mod VB cueing required for step length, weight shifting, and LLE positioning.   Pt transported back to room total A in wc. Pt was left resting in wc  with call bell in reach, seat belt alarm on, and all needs met.   Therapy Documentation Precautions:  Precautions Precautions: Fall, Other (comment) Precaution Comments: L hemiparesis, L lean Restrictions Weight Bearing Restrictions: No General:   Vital Signs: Therapy Vitals Temp: 97.8 F (36.6 C) Pulse Rate: 65 Resp: 17 BP: (Abnormal) 144/81 Patient Position (if appropriate): Lying Oxygen Therapy SpO2: 97 % O2 Device: Room Air Pain: Pain Assessment Pain Scale: 0-10 Pain Score: 8  Pain Type: Acute pain Pain Location: Head Pain Orientation: Mid Pain Descriptors / Indicators: Aching;Headache Pain Frequency: Constant Pain Onset: Gradual Patients Stated Pain Goal: 3 Pain Intervention(s): Medication (See eMAR) Multiple Pain Sites: No   Therapy/Group: Individual Therapy  [name] 01/28/2023, 7:51 AM

## 2023-01-29 MED ORDER — BUTALBITAL-APAP-CAFFEINE 50-325-40 MG PO TABS
1.0000 | ORAL_TABLET | Freq: Four times a day (QID) | ORAL | Status: DC | PRN
  Administered 2023-01-29 – 2023-02-02 (×8): 1 via ORAL
  Filled 2023-01-29 (×9): qty 1

## 2023-01-29 MED ORDER — TOPIRAMATE 25 MG PO TABS
75.0000 mg | ORAL_TABLET | Freq: Two times a day (BID) | ORAL | Status: DC
  Administered 2023-01-29 – 2023-02-03 (×10): 75 mg via ORAL
  Filled 2023-01-29 (×10): qty 3

## 2023-01-29 MED ORDER — POTASSIUM CHLORIDE CRYS ER 10 MEQ PO TBCR
10.0000 meq | EXTENDED_RELEASE_TABLET | Freq: Two times a day (BID) | ORAL | Status: AC
  Administered 2023-01-29 – 2023-01-30 (×4): 10 meq via ORAL
  Filled 2023-01-29 (×4): qty 1

## 2023-01-29 NOTE — Progress Notes (Signed)
Patient ID: Patricia [name], female   DOB: 09-Jul-1946, 77 y.o.   MRN: [id number]  Team Conference Report to Patient/Family  Team Conference discussion was reviewed with the patient and caregiver, including goals, any changes in plan of care and target discharge date.  Patient and caregiver express understanding and are in agreement.  The patient has a target discharge date of 02/05/23 (LOS extended).  Sw met with patient and provided conference updates. Patient and family informed of the extension. Family completing family education today. Granddaughter, [name] provided an additional copy of [employer] paperwork. No additional questions or concerns.   [name] 01/29/2023, 2:26 PM

## 2023-01-29 NOTE — Progress Notes (Signed)
Speech Language Pathology Daily Session Note  Patient Details  Name: Patricia [name] MRN: [id number] Date of Birth: 1946/10/25  Today's Date: 01/29/2023 SLP Individual Time: 1400-1430 SLP Individual Time Calculation (min): 30 min  Short Term Goals: Week 2: SLP Short Term Goal 1 (Week 2): STG=LTG due to ELOS  Skilled Therapeutic Interventions: Skilled treatment session focused on education with the patient, her husband, and her granddaughter. SLP facilitated session by providing education regarding patient's current swallowing function, diet recommendations, appropriate textures, swallowing compensatory strategies, and medication administration. SLP also provided education regarding strategies to utilize to maximize recall and overall cognitive functioning including the importance of participating in cognitive tasks/activities at home. All verbalized understanding and handouts were given to reinforce information. Patient left upright in wheelchair with family present. Continue with current plan of care.      Pain Pain Assessment Pain Scale: 0-10 Pain Score: 0-No pain  Therapy/Group: Individual Therapy  Hawthorne Day 01/29/2023, 3:36 PM

## 2023-01-29 NOTE — Progress Notes (Signed)
Patient ID: Patricia [name], female   DOB: Dec 07, 1946, 77 y.o.   MRN: [id number]  Sw made attempt to call pt's granddaughter in reference to pt's leave paperwork. SW left detailed VM, will wait for follow up.

## 2023-01-29 NOTE — Patient Care Conference (Signed)
Inpatient RehabilitationTeam Conference and Plan of Care Update Date: 01/29/2023   Time: 10:47 AM    Patient Name: Patricia [name]      Medical Record Number: [id number]  Date of Birth: [birth year] Sex: Female         Room/Bed: 4W11C/4W11C-01 Payor Info: Payor: [employer] MEDICARE / Plan: [place] MEDICARE HMO/PPO / Product Type: *No Product type* /    Admit Date/Time:  01/17/2023  3:45 PM  Primary Diagnosis:  Embolic stroke [facility])  Hospital Problems: Principal Problem:   Embolic stroke [facility]) Active Problems:   Nonintractable episodic headache   Pain of left lower extremity    Expected Discharge Date: Expected Discharge Date: 02/05/23 (LOS extended)  Team Members Present: Physician leading conference: Dr. Alysia Penna Social Worker Present: Erlene Quan, BSW Nurse Present: Dorien Chihuahua, RN PT Present: Page Spiro, PT OT Present: Other (comment) Lowella Fairy, OT) SLP Present: Weston Anna, SLP     Current Status/Progress Goal Weekly Team Focus  Bowel/Bladder   Continent of b/b   Remain continent   Assist with toileting as needed    Swallow/Nutrition/ Hydration   dys 3 diet with thin liquids via provale cup, sup A   mod I  tolerance of current diet with implementation of swallowing precautions/strategies. Regular PO trials as tolerated    ADL's   supervision UB BADLs, mod LB dressing/bathing d/t poor sitting and standing balance, supervision grooming/hygeine, min A stand pivot> shower chair/toilet HHA using grab bars;continues to have decreased body awareness and decreased awareness to LLE placement, decreased attention during BADLs and fucntional moiblity, making progress towards goals, however progress is very slow   BADLs set-up assist, min A LB dressing, toilet and shower transfer downgraded to min A   BADL retraining, balance trianing, functional mobility, bed mobility, endurance trianing, activity tolerance, d/c planning, DME education, family education, U/LB  strengthening    Mobility   supervision bed mobility using hospital bed features, min assist sit<>stand and stand pivot transfers using HHA vs RW continuing to have L posterior lean bias, min/mod assist gait up to 123ft using L HHA vs RW with pt continuing to have poor L LE attention/awareness with decreased foot clearance and step length resulting in L lean/LOB - continues to have significant difficulty with focused/sustained attention on mobility tasks - Berg Balance Test improved to 13/56   downgraded to CGA/min assist overall at ambulatory level to allow shorter LOS in order to begin oncology treatments sooner  activity tolerance, pt/family education, transfer training, bed mobility training, gait training, dynamic standing balance, L hemibody NMR, midline orientation    Communication                Safety/Cognition/ Behavioral Observations  sup-to-min A   mod I-to-sup A   attention, memory, awareness, problem solving skills    Pain   C/O chronic headaches, PRN meds available   Pain <3/10   Assess Qshift and prn    Skin   Skin intact   Maintain skin integrity  Assess qshift and prn      Discharge Planning:  Discharging home with spouse and granddaughter to assist. Family education on Wednesday.   Team Discussion: Patient with new NSCLC diagnosis stage IV, undergoing chemo which is on hold until after rehab for stroke.  Patient c/o HA, seeing floaters/dark spots but left LE pain is better post CVA. Patient with poor sitting/standing balance, decreased attention and poor endurance, decreased left LE awareness and needs cues for attention.  Patient on target to  meet rehab goals: no, currently needs supervision for upper body care and mod assist for lower body bathing and dressing. Completes hygiene seated with supervision, shower and transfers with min assist.  Completes sit - stand with HHA and min assist.  Goals for discharge set for min assist overall.  *See Care Plan and  progress notes for long and short-term goals.   Revisions to Treatment Plan:  Downgraded goals  Ophthalmology referral   Teaching Needs: Safety, medications, dietary modifications, transfers, toileting, etc.   Current Barriers to Discharge: Decreased caregiver support and Home enviroment access/layout  Possible Resolutions to Barriers: Family education HH follow up services DME: W/C, RW, BSC; already has a hospital bed    Medical Summary Current Status: Lower extremity pain improved, diarrhea improved, blood pressure controlled  Barriers to Discharge: Pending chemo/radiation   Possible Resolutions to Barriers/Weekly Focus: Caregiver training, may need to identify addition we will caregivers given the husband's age and infirmity   Continued Need for Acute Rehabilitation Level of Care: The patient requires daily medical management by a physician with specialized training in physical medicine and rehabilitation for the following reasons: Direction of a multidisciplinary physical rehabilitation program to maximize functional independence : Yes Medical management of patient stability for increased activity during participation in an intensive rehabilitation regime.: Yes Analysis of laboratory values and/or radiology reports with any subsequent need for medication adjustment and/or medical intervention. : Yes   I attest that I was present, lead the team conference, and concur with the assessment and plan of the team.   Margarito Liner 01/29/2023, 3:21 PM

## 2023-01-29 NOTE — Progress Notes (Signed)
Physical Therapy Session Note  Patient Details  Name: MARKIA KYER MRN: [id number] Date of Birth: 10/05/1946  Today's Date: 01/29/2023 PT Individual Time: 0920-1032 and 9381-0175 PT Individual Time Calculation (min): 72 min and 43 min  Short Term Goals: Week 2:  PT Short Term Goal 1 (Week 2): = to LTGs based on ELOS  Skilled Therapeutic Interventions/Progress Updates:    Session 1: Pt received supine in bed awake and agreeable to therapy session. Pt reports she already has a hospital bed at home. Supine>sitting L EOB, HOB partially elevated and relying on bedrail to bring trunk upright, with close supervision and increased time/effort. Sitting EOB donned tennis sheos total assist for time management.   Sit>stand EOB>RW with pt able to recall correct hand placement using AD, but still requires max cuing to bring L foot "back and apart" underneath and widen BOS - CGA for steadying with pt relying sightly on backs of legs to balance while rising due to minor posterior lean. Gait training ~86ft x2 to/from bathroom using RW with primarily CGA but requires min assist when navigating over bathroom threshold and when turning to stand at sink due to minor posterior LOB with poor balance recovery strategy. Standing with CGA for steadying while pt performed LB clothing management without assist - pt with repeated minor posterior LOB during requiring use of ankle strategy and backs of legs on toilet to maintain balance. Continent of bladder and performed seated peri-care set-up assist. Standing at sink, initially requires min assist for balance and max cuing to step all the way up to the sink and rest front of hips on counter for support then able to maintain balance with CGA/close supervision.   Pt continues to require min/mod cuing to focus on mobility task at hand to improve her balance rather than being distracted with conversation.   Transported to/from gym in w/c for time management and energy  conservation.  Gait training ~71ft +77ft in ADL apartment using RW to practice in home environment with primarily CGA and only a few instances of min assist to recovery minor posterior LOB - pt with more consistently symmetrical step lengths for reciprocal pattern - continued min/mod cuing to focus on ambulation during.   Patient participated in [place] and demonstrates significantly increased fall risk as noted by score of 13/56; however, this is a clinically significant improvement compared to the 5/56 pt scored on 01/21/23  (<36= high risk for falls, close to 100%; 37-45 significant >80%; 46-51 moderate >50%; 52-55 lower >25%).  Transported back to her room and pt performed ~47ft ambulatory transfer w/c>EOB using RW for practice and carryover of education provided throughout session with CGA and 1x min assist due to minor L posterior LOB when turning and stepping back to the bed.  Sit>supine supervision using bed features. Pt left supine in bed with needs in reach and bed alarm on.  Session 2: Pt received supine in bed with her husband, [name], and granddaughter, [name], present for family education/training. Pt agreeable to therapy session.   Therapist provided education on the following:  - recommendation for 24hr support at D/C, family confirms this is arranged - pts need for physical assistance for all mobility - use of gait belt to provide assistance - recommendation for pt to use wheelchair when needed vs ambulating with RW and assistance - need to wear tennis shoes vs gripper socks - decreasing clutter and pick-up rugs in home to decrease trip hazards - providing pt physical assistance on L  posterior side as this the direction she typically has LOB - plan to extend her LOS to February 28th based on pt being unable to see Oncologist until March 4th and she is continuing to make good progress with therapy  Supine<>sitting using bed features, family confirms pt has hospital bed  at home, with supervision.   Therapist provided visual demonstration on how to assist pt with stand pivot transfers EOB<>w/c using RW - performed 1x with therapist and then 2x with pt's husband providing physical assistance demonstrating excellent understanding.  Transported to ADL apartment. Pt ambulated ~84ft to recliner using RW with therapist providing physical assistance transitioned to pt's granddaughter - CGA with occasional min assist for balance recovery. Pt ambulated additional ~84ft using RW with pt's husband providing physical assistance. Both demonstrate excellent technique and understanding of how to safely guarding the patient with min progressed to no cuing.  Verbally reviewed car transfer but did not have time to perform simulated trial at this time. Pt and family report no additional questions/concerns at this time and report feeling good about education provided today. Pt left sitting in w/c with needs in reach and family present to assume care of her until next therapy session.   Therapy Documentation Precautions:  Precautions Precautions: Fall, Other (comment) Precaution Comments: L hemiparesis, L lean Restrictions Weight Bearing Restrictions: No   Pain:   Session 1: No reports of pain throughout session.  Session 2: No reports of pain throughout session.   Balance: Standardized Balance Assessment Standardized Balance Assessment: Berg Balance Test Berg Balance Test Sit to Stand: Able to stand using hands after several tries (pt requires use of backs of legs against mat to come to standing in midline) Standing Unsupported: Needs several tries to stand 30 seconds unsupported (has posterior LOB with backs against mat at 25sec then at 43sec) Sitting with Back Unsupported but Feet Supported on Floor or Stool: Able to sit 2 minutes under supervision Stand to Sit: Uses backs of legs against chair to control descent Transfers: Able to transfer with verbal cueing and /or  supervision (supervision for safety and heavy reliance on B UE support) Standing Unsupported with Eyes Closed: Able to stand 10 seconds with supervision (requries close supervision for safety) Standing Ubsupported with Feet Together: Needs help to attain position and unable to hold for 15 seconds From Standing, Reach Forward with Outstretched Arm: Loses balance while trying/requires external support (posterior LOB) From Standing Position, Pick up Object from Floor: Unable to try/needs assist to keep balance From Standing Position, Turn to Look Behind Over each Shoulder: Needs assist to keep from losing balance and falling Turn 360 Degrees: Needs assistance while turning Standing Unsupported, Alternately Place Feet on Step/Stool: Needs assistance to keep from falling or unable to try Standing Unsupported, One Foot in Front: Loses balance while stepping or standing Standing on One Leg: Unable to try or needs assist to prevent fall Total Score: 13   Therapy/Group: Individual Therapy  Tawana Scale , PT, DPT, NCS, CSRS 01/29/2023, 7:56 AM

## 2023-01-29 NOTE — Progress Notes (Signed)
Occupational Therapy Session Note  Patient Details  Name: Patricia [name] MRN: [id number] Date of Birth: [birth year]  Today's Date: 01/29/2023 OT Individual Time: 1430-1525 OT Individual Time Calculation (min): 55 min    Short Term Goals: Week 1:  OT Short Term Goal 1 (Week 1): The pt will transfer to all surfaces with MinA at 95% safe OT Short Term Goal 1 - Progress (Week 1): Progressing toward goal OT Short Term Goal 2 (Week 1): The pt will complete LB dressing with MinA at 95% safe OT Short Term Goal 2 - Progress (Week 1): Progressing toward goal OT Short Term Goal 3 (Week 1): The pt will tolerate >45 minutes of activity with minimal rest breaks @ 95% safe OT Short Term Goal 3 - Progress (Week 1): Progressing toward goal OT Short Term Goal 4 (Week 1): The pt will complete toileting with MinA by incorporating the grab bars  @ 95% safe OT Short Term Goal 4 - Progress (Week 1): Progressing toward goal Week 2:  OT Short Term Goal 1 (Week 2): STG=LTG d/t Pt ELOS  Skilled Therapeutic Interventions/Progress Updates:     Pt received sitting up in wc with grand daughter, [name], and husband, [name], present in room ready for family education focused session. Pt reporting 0/10 pain and requesting to use restroom at beginning of session. Provided Pt family with demonstration on assisting Pt with toilet transfer with family assisting Pt to toilet following demonstration with skilled feedback provided. Pt family reporting they felt comfortable assisting Pt to bathroom with Pt able to complete with min A ot CGA using RW with mild LOB noted during ambulation. Pt grand daughter demonstrating safe Pt handling skills assisting Pt with dynamic standing balance while Pt completed LB clothing management. Provided education on shower transfer with simulated shower edge used during demonstration. Both pt grand Dtr and husband practiced transfer and simulated shower with Pt- demonstrating teach back as evidence of  learning with feedback provided to increase Pt safety. OT educated family on safe Pt handling skills including body mechanics, identifying Pt deficits, tactile.Vb cueing, and fall prevention. Educated on need for 24/7 supervision following d/c with [facility] services recommended. Discussed home safety with emphasis on fall prevention and increasing accessibility through home set-up. Pt and family educated on fall prevention and DME usage with demonstration provided. Pt family and Pt receptive to all education with active participation throughout session. Pt left resting in wc with call bell in reach, seat belt alarm on, and all needs met.   Therapy Documentation Precautions:  Precautions Precautions: Fall, Other (comment) Precaution Comments: L hemiparesis, L lean Restrictions Weight Bearing Restrictions: No  ADL: ADL Equipment Provided: Reacher Eating: Not assessed (based on observation pt is s/u assist does present with swallowing challenges encouraged to tuck chin to reduce incidence for adverse response.) Where Assessed-Eating: Wheelchair Grooming: Setup Where Assessed-Grooming: Sitting at sink Upper Body Bathing: Setup Where Assessed-Upper Body Bathing: Sitting at sink Lower Body Bathing: Minimal assistance, Moderate assistance Where Assessed-Lower Body Bathing: Sitting at sink Upper Body Dressing: Setup Where Assessed-Upper Body Dressing: Wheelchair Lower Body Dressing: Minimal assistance, Moderate assistance Where Assessed-Lower Body Dressing: Wheelchair Toileting: Modified independent, Contact guard (secondary to challenges with transfer from w/c to commode needing additional time) Where Assessed-Toileting: Glass blower/designer: Moderate assistance, Minimal assistance (using grab bar and additional time.) Toilet Transfer Method: Stand pivot Toilet Transfer Equipment: Grab bars (grab bar and arm of w/c) Tub/Shower Transfer: Minimal assistance, Moderate assistance (based on  observation of toilet transfer.)  Walk-In Shower Transfer: Not assessed  Therapy/Group: Individual Therapy  [name] 01/29/2023, 7:58 AM

## 2023-01-29 NOTE — Progress Notes (Signed)
PROGRESS NOTE   Subjective/Complaints:  No issues overnite , pt is worried that husband will need support as a caregiver post discharge   ROS:  Denies fevers, chills, CP, SOB, abd pain, N/V/C  Objective:   No results found. Recent Labs    01/27/23 0618  WBC 4.2  HGB 10.8*  HCT 33.3*  PLT 186     Recent Labs    01/27/23 0618  NA 140  K 3.4*  CL 107  CO2 20*  GLUCOSE 117*  BUN 14  CREATININE 0.88  CALCIUM 9.1     No intake or output data in the 24 hours ending 01/29/23 0925       Physical Exam: Vital Signs Blood pressure (!) 140/68, pulse 69, temperature 97.6 F (36.4 C), temperature source Oral, resp. rate 18, height 4' 9.99" (1.473 m), weight 63.1 kg, SpO2 94 %.   General: No acute distress, laying in bed Mood and affect are appropriate, less tearful this week Heart: Regular rate and rhythm no rubs murmurs or extra sounds Lungs: Clear to auscultation, breathing unlabored, no rales, rhonchi, or wheezes Abdomen: Positive bowel sounds, soft nontender to palpation, nondistended Extremities: No clubbing, cyanosis, or edema, left calf tenderness mild and seems improved today, no swelling or erythema   Skin: No evidence of breakdown, no evidence of rash  Prior exam: MSK:      No apparent deformity.      Strength:               5/5 RUE and RLE, 4+ LUE and LLE   Neurologic exam:  Cognition: AAO to person, place, time and event.  Language: Fluent, No substitutions or neoglisms. +Hoarse, breathy vocal quality.   Mood: Pleasant affect, appropriate mood.  Sensation: To light touch intact in BL UEs and LEs    Spasticity: MAS 0 in all extremities.     Assessment/Plan: 1. Functional deficits which require 3+ hours per day of interdisciplinary therapy in a comprehensive inpatient rehab setting. Physiatrist is providing close team supervision and 24 hour management of active medical problems listed  below. Physiatrist and rehab team continue to assess barriers to discharge/monitor patient progress toward functional and medical goals  Care Tool:  Bathing    Body parts bathed by patient: Right arm, Left arm, Chest, Abdomen, Front perineal area, Buttocks, Right upper leg, Left upper leg, Face   Body parts bathed by helper: Left lower leg, Right lower leg     Bathing assist Assist Level: Minimal Assistance - Patient > 75%     Upper Body Dressing/Undressing Upper body dressing   What is the patient wearing?: Pull over shirt    Upper body assist Assist Level: Set up assist    Lower Body Dressing/Undressing Lower body dressing      What is the patient wearing?: Pants     Lower body assist Assist for lower body dressing: Moderate Assistance - Patient 50 - 74%     Toileting Toileting    Toileting assist Assist for toileting: Contact Guard/Touching assist     Transfers Chair/bed transfer  Transfers assist     Chair/bed transfer assist level: Minimal Assistance - Patient > 75% (stand  pivot with HHA)     Locomotion Ambulation   Ambulation assist      Assist level: Moderate Assistance - Patient 50 - 74% Assistive device: Hand held assist Max distance: 197ft   Walk 10 feet activity   Assist     Assist level: 2 helpers Assistive device: Hand held assist   Walk 50 feet activity   Assist Walk 50 feet with 2 turns activity did not occur: Safety/medical concerns         Walk 150 feet activity   Assist Walk 150 feet activity did not occur: Safety/medical concerns         Walk 10 feet on uneven surface  activity   Assist Walk 10 feet on uneven surfaces activity did not occur: Safety/medical concerns         Wheelchair     Assist Is the patient using a wheelchair?: Yes Type of Wheelchair: Manual    Wheelchair assist level: Moderate Assistance - Patient 50 - 74% Max wheelchair distance: 150'    Wheelchair 50 feet with 2 turns  activity    Assist        Assist Level: Moderate Assistance - Patient 50 - 74%   Wheelchair 150 feet activity     Assist      Assist Level: Moderate Assistance - Patient 50 - 74%   Blood pressure (!) 140/68, pulse 69, temperature 97.6 F (36.4 C), temperature source Oral, resp. rate 18, height 4' 9.99" (1.473 m), weight 63.1 kg, SpO2 94 %.  Medical Problem List and Plan: 1. 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 metastatic             -patient may shower             -ELOS/Goals: 12-14 days, SPV PT/OT/SLP goals  -Continue CIR, d/c goal 02/01/23 Team conference today please see physician documentation under team conference tab, met with team  to discuss problems,progress, and goals. Formulized individual treatment plan based on medical history, underlying problem and comorbidities.  2. Antithrombotics: -DVT/anticoagulation:  Pharmaceutical: Eliquis 5 mg BID>>inc to 10mg  BID x 7d (01/20/23-01/27/23) then resume 5mg  BID starting 01/27/23 @2000  -antiplatelet therapy: N/A - was on ASA and Plavix prior to stroke, now on Eliquis only, will be on 10mg  BID x7d then resume 5mg , Dr Julien Nordmann in agreement   3. LBP/Pain Management: Will continue tylenol 650mg  q6h PRN for now.  -order ben gay for Left calf pain-- changed to scheduled TID 01/25/23 -Lidoderm patch to neck QD 4. Mood/Behavior/Sleep: LCSW to follow for evaluation and support.                       --Melatonin 6mg  QHS PRN, Trazodone prn for insomnia.        -antipsychotic agents: N/A 5. Neuropsych/cognition: This patient is capable of making decisions on her own behalf. 6. Skin/Wound Care: Routine pressure relief measures.   7. Fluids/Electrolytes/Nutrition: Monitor I/O. Monitor weekly labs mild hypoK= will supplement x 2 d and repeat 2/23     Latest Ref Rng & Units 01/27/2023    6:18 AM 01/20/2023    8:38 AM 01/18/2023    6:15 AM  BMP  Glucose 70 - 99 mg/dL 117  111  104    BUN 8 - 23 mg/dL 14  12  11    Creatinine 0.44 - 1.00 mg/dL 0.88  0.86  0.84   Sodium 135 - 145 mmol/L 140  136  137  Potassium 3.5 - 5.1 mmol/L 3.4  3.4  4.0   Chloride 98 - 111 mmol/L 107  106  103   CO2 22 - 32 mmol/L 20  23  24    Calcium 8.9 - 10.3 mg/dL 9.1  9.1  9.2      8. HTN: Monitor BP TID--continue to hold Valsartan and HCTZ.              --SBP trending 100- 140 range. Avoid hypoperfusion   -01/26/23 BPs stable, cont to monitor Vitals:   01/25/23 1326 01/25/23 1938 01/26/23 0308 01/26/23 1311  BP: (!) 140/74 119/70 (!) 148/66 109/62   01/26/23 2022 01/27/23 0424 01/27/23 1358 01/27/23 1947  BP: 132/69 139/63 135/73 115/76   01/28/23 0355 01/28/23 1341 01/28/23 1930 01/29/23 0305  BP: (!) 144/81 136/76 132/72 (!) 140/68      9. Stage IV  NSCLC w/lymphadenopathy/muscular nodules: Chemo to resume after discharge. --Per neurology, repeat MRI brain w/ and w/o in 6-8 weeks which would be ` last 2 weeks of March to continue to monitor for potential metastatic disease. Will need neuro outpt f/u 2-4 wks post discharge  10. GERD s/p esophageal dilatation: Continue Protonix 40mg  BID. Will add pepcid 20mg  BID also.  11. Anxiety/depression: On Paxil 40mg  daily w/xanax 0.5mg  QHS prn.   12. Hyperlipidemia: On Crestor 20mg  QD  13. Neck pain/Headaches: On PRN Fioricet 2 tabs Q6H, using consistently.  Will add Topamax 25 mg bid.              --lidocaine patch for local measures.              -- Flexeril 5 mg TID PRN for muscle tightness in neck --increased topamax to 50mg  BID- reassess for dose  increase in am  -01/25/23 improving, cont regimen  14. Horseness/L-VC paralysis : Has been ongoing since August-->a little worse.               -- SLP for swallow, speech              -- ISB   15. Dizziness/nausea/double vision: R-beating nystagmus on exam. Vestibular eval and treat.  -01/18/23 was getting Zofran inpatient, ordered 4mg  ODT q8h PRN n/v; also has Compazine PRN  16.  Persistent cough since CVA. Causing cervicalgia, HA's, and nausea/vomiting with PO intakes.              --Treat cough -rule out aspiration/post obstructive infection.                -- Duo nebs qid added w/tessalon perles prn Improving , has lung ca so would expect some cough   17. Anemia: Hgb down to 10.5 on 01/10/23, stable at 10.7 on 01/20/23; monitor on weekly labs next 1/51/76  -Continue Folic acid 1mg  QD, multivitamins  18. Hx of rectal surgery, chronic constipation:  -01/18/23 LBM yesterday, but requests stool softeners given hx of rectal surgery. Ordered Miralax 17g QD + PRN, Colace 100mg  QD + PRN. Has Dulcolax supp PRN, Fleet's PRN.  -01/19/23 no BM in 2 days, doesn't like miralax; change miralax to PRN only, increase Colace to 100mg  BID+PRN, add Senna 8.6mg  QD -Given sorbitol x 1 on 01/20/23; D/c'd Colace, Senokot, and Miralax on 01/22/23, started dulcolax supp QD and Senokot-S 3 tabs BID on 01/22/23. -01/25/23 now with diarrhea; change Senokot-S to BID PRN, leave Dulcolax supp in place, monitor for now -2/20, BMx 2 on 2/19- change dulc supp to prn  19. LLE pain: nursing reporting this  after assessment this morning 01/18/23, per PT reports-- PT concerned for DVT, no significant swelling noted on exam this morning -01/18/23 ordered doppler U/S bilateral LEs to r/o DVT given hx of malignancy, continue above pain meds -01/20/23 + DVT , increase Eliquis to 10mg  BID x 7d then resume 5mg  BID- may cont therapy  -01/25/23 scheduled [place]ay as above -01/28/23 much better pain control, cont to monitor    LOS: 12 days A FACE TO FACE EVALUATION WAS PERFORMED  Charlett [name] 01/29/2023, 9:25 AM

## 2023-01-30 NOTE — Progress Notes (Signed)
Patient ID: Patricia [name], female   DOB: 10/23/1946, 77 y.o.   MRN: [id number]  Handicapped placement card application provided. Contact information provided to [employer] to call to make co payment for WC.

## 2023-01-30 NOTE — Progress Notes (Addendum)
Speech Language Pathology Daily Session Note  Patient Details  Name: Patricia [name] MRN: [id number] Date of Birth: 1946-01-20  Today's Date: 01/30/2023 SLP Individual Time: 0800-0836 SLP Individual Time Calculation (min): 36 min  Short Term Goals: Week 3: SLP Short Term Goal 1 (Week 3): STGs=LTGs due to ELOS  Skilled Therapeutic Interventions: Skilled ST treatment focused on cognitive goals. Pt was passed off by SLP for continuation of session with focus on medication management. SLP facilitated education on current medication regime and reviewed each medication by name, dose, reason for taking, and frequency while using a individualized medication chart for reference. Medication chart provided to pt for purpose of an external aid. Pt identified pertinent information on chart with mod I. Required min A verbal cues for recall of medication name/purpose given limited field without the use of external aid. SLP then facilitated TID pillbox organization with min fading to sup A verbal cues with 100% accuracy. Pt benefited from opportunities to confirm correct organization to build confidence with task. Pt independently assessed pillbox for accuracy with each practice prescription. It is recommended pt have supervision with medication management at discharge. Pt verbalized agreement and stated her family will be there to provide support as needed. Pt with concerns about having another stroke. SLP provided general stroke education while emphasizing taking medications consistently, diet, exercise, sleep, and stress relief. Patient was left in bed with alarm activated and immediate needs within reach at end of session. Continue per current plan of care.      Pain  None/denied  Therapy/Group: Individual Therapy  [name] 01/30/2023, 8:58 AM

## 2023-01-30 NOTE — Progress Notes (Signed)
PROGRESS NOTE   Subjective/Complaints:  No new issues, is ok with longer LOS   ROS:  Denies fevers, chills, CP, SOB, abd pain, N/V/C  Objective:   No results found. No results for input(s): "WBC", "HGB", "HCT", "PLT" in the last 72 hours.   No results for input(s): "NA", "K", "CL", "CO2", "GLUCOSE", "BUN", "CREATININE", "CALCIUM" in the last 72 hours.    Intake/Output Summary (Last 24 hours) at 01/30/2023 0832 Last data filed at 01/30/2023 0700 Gross per 24 hour  Intake 700 ml  Output --  Net 700 ml         Physical Exam: Vital Signs Blood pressure (!) 141/89, pulse 65, temperature 98.2 F (36.8 C), resp. rate 16, height 4' 9.99" (1.473 m), weight 63.1 kg, SpO2 97 %.   General: No acute distress, laying in bed Mood and affect are appropriate, less tearful this week Heart: Regular rate and rhythm no rubs murmurs or extra sounds Lungs: Clear to auscultation, breathing unlabored, no rales, rhonchi, or wheezes Abdomen: Positive bowel sounds, soft nontender to palpation, nondistended Extremities: No clubbing, cyanosis, or edema, left calf tenderness mild and seems improved today, no swelling or erythema   Skin: No evidence of breakdown, no evidence of rash  Prior exam: MSK:      No apparent deformity.      Strength:               5/5 RUE and RLE, 5- LUE and LLE   Neurologic exam:  Cognition: AAO to person, place, time and event.  Voice:  +Hoarse, breathy vocal quality.   Mood: Pleasant affect, appropriate mood.  Sensation: To light touch intact in BL UEs and LEs    Spasticity: MAS 0 in all extremities.     Assessment/Plan: 1. Functional deficits which require 3+ hours per day of interdisciplinary therapy in a comprehensive inpatient rehab setting. Physiatrist is providing close team supervision and 24 hour management of active medical problems listed below. Physiatrist and rehab team continue to assess  barriers to discharge/monitor patient progress toward functional and medical goals  Care Tool:  Bathing    Body parts bathed by patient: Right arm, Left arm, Chest, Abdomen, Front perineal area, Buttocks, Right upper leg, Left upper leg, Face   Body parts bathed by helper: Left lower leg, Right lower leg     Bathing assist Assist Level: Minimal Assistance - Patient > 75%     Upper Body Dressing/Undressing Upper body dressing   What is the patient wearing?: Pull over shirt    Upper body assist Assist Level: Set up assist    Lower Body Dressing/Undressing Lower body dressing      What is the patient wearing?: Pants     Lower body assist Assist for lower body dressing: Moderate Assistance - Patient 50 - 74%     Toileting Toileting    Toileting assist Assist for toileting: Contact Guard/Touching assist     Transfers Chair/bed transfer  Transfers assist     Chair/bed transfer assist level: Minimal Assistance - Patient > 75% (stand pivot with HHA)     Locomotion Ambulation   Ambulation assist      Assist level:  Moderate Assistance - Patient 50 - 74% Assistive device: Hand held assist Max distance: 182ft   Walk 10 feet activity   Assist     Assist level: 2 helpers Assistive device: Hand held assist   Walk 50 feet activity   Assist Walk 50 feet with 2 turns activity did not occur: Safety/medical concerns         Walk 150 feet activity   Assist Walk 150 feet activity did not occur: Safety/medical concerns         Walk 10 feet on uneven surface  activity   Assist Walk 10 feet on uneven surfaces activity did not occur: Safety/medical concerns         Wheelchair     Assist Is the patient using a wheelchair?: Yes Type of Wheelchair: Manual    Wheelchair assist level: Moderate Assistance - Patient 50 - 74% Max wheelchair distance: 150'    Wheelchair 50 feet with 2 turns activity    Assist        Assist Level:  Moderate Assistance - Patient 50 - 74%   Wheelchair 150 feet activity     Assist      Assist Level: Moderate Assistance - Patient 50 - 74%   Blood pressure (!) 141/89, pulse 65, temperature 98.2 F (36.8 C), resp. rate 16, height 4' 9.99" (1.473 m), weight 63.1 kg, SpO2 97 %.  Medical Problem List and Plan: 1. 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 Antithrombotics: -DVT/anticoagulation:  Pharmaceutical: Eliquis 5 mg BID>>inc to 10mg  BID x 7d (01/20/23-01/27/23) then resume 5mg  BID starting 01/27/23 @2000  -antiplatelet therapy: N/A - was on ASA and Plavix prior to stroke, now on Eliquis only, will be on 10mg  BID x7d then resume 5mg , Dr Julien Nordmann in agreement   3. LBP/Pain Management: Will continue tylenol 650mg  q6h PRN for now.  -order ben gay for Left calf pain-- changed to scheduled TID 01/25/23 -Lidoderm patch to neck QD 4. Mood/Behavior/Sleep: LCSW to follow for evaluation and support.                       --Melatonin 6mg  QHS PRN, Trazodone prn for insomnia.        -antipsychotic agents: N/A 5. Neuropsych/cognition: This patient is capable of making decisions on her own behalf. 6. Skin/Wound Care: Routine pressure relief measures.   7. Fluids/Electrolytes/Nutrition: Monitor I/O. Monitor weekly labs mild hypoK= will supplement x 2 d and repeat 2/23     Latest Ref Rng & Units 01/27/2023    6:18 AM 01/20/2023    8:38 AM 01/18/2023    6:15 AM  BMP  Glucose 70 - 99 mg/dL 117  111  104   BUN 8 - 23 mg/dL 14  12  11    Creatinine 0.44 - 1.00 mg/dL 0.88  0.86  0.84   Sodium 135 - 145 mmol/L 140  136  137   Potassium 3.5 - 5.1 mmol/L 3.4  3.4  4.0   Chloride 98 - 111 mmol/L 107  106  103   CO2 22 - 32 mmol/L 20  23  24    Calcium 8.9 - 10.3 mg/dL 9.1  9.1  9.2      8. HTN:  Monitor BP  TID--continue to hold Valsartan and HCTZ.              --SBP trending 100- 140 range. Avoid hypoperfusion   -01/26/23 BPs stable, cont to monitor Vitals:   01/26/23 1311 01/26/23 2022 01/27/23 0424 01/27/23 1358  BP: 109/62 132/69 139/63 135/73   01/27/23 1947 01/28/23 0355 01/28/23 1341 01/28/23 1930  BP: 115/76 (!) 144/81 136/76 132/72   01/29/23 0305 01/29/23 1549 01/29/23 1920 01/30/23 0507  BP: (!) 140/68 130/69 136/82 (!) 141/89      9. Stage IV  NSCLC w/lymphadenopathy/muscular nodules: Chemo to resume after discharge. --Per neurology, repeat MRI brain w/ and w/o in 6-8 weeks which would be ` last 2 weeks of March to continue to monitor for potential metastatic disease. Will need neuro outpt f/u 2-4 wks post discharge  10. GERD s/p esophageal dilatation: Continue Protonix 40mg  BID. Will add pepcid 20mg  BID also.  11. Anxiety/depression: On Paxil 40mg  daily w/xanax 0.5mg  QHS prn.   12. Hyperlipidemia: On Crestor 20mg  QD  13. Neck pain/Headaches: On PRN Fioricet 2 tabs Q6H, using consistently.  Will add Topamax 25 mg bid.              --lidocaine patch for local measures.              -- Flexeril 5 mg TID PRN for muscle tightness in neck --increased topamax to 50mg  BID- reassess for dose  increase in am  -01/25/23 improving, cont regimen  14. Horseness/L-VC paralysis : Has been ongoing since August-->a little worse.               -- SLP for swallow, speech              -- ISB   15. Dizziness/nausea/double vision: R-beating nystagmus on exam. Vestibular eval and treat.  -01/18/23 was getting Zofran inpatient, ordered 4mg  ODT q8h PRN n/v; also has Compazine PRN  16. Persistent cough since CVA. Causing cervicalgia, HA's, and nausea/vomiting with PO intakes.              --Treat cough -rule out aspiration/post obstructive infection.                -- Duo nebs qid added w/tessalon perles prn Improving , has lung ca so would expect some cough   17. Anemia: Hgb down to 10.5 on  01/10/23, stable at 10.7 on 01/20/23; monitor on weekly labs next 8/81/10  -Continue Folic acid 1mg  QD, multivitamins  18. Hx of rectal surgery, chronic constipation:  -01/18/23 LBM yesterday, but requests stool softeners given hx of rectal surgery. Ordered Miralax 17g QD + PRN, Colace 100mg  QD + PRN. Has Dulcolax supp PRN, Fleet's PRN.  -01/19/23 no BM in 2 days, doesn't like miralax; change miralax to PRN only, increase Colace to 100mg  BID+PRN, add Senna 8.6mg  QD -Given sorbitol x 1 on 01/20/23; D/c'd Colace, Senokot, and Miralax on 01/22/23, started dulcolax supp QD and Senokot-S 3 tabs BID on 01/22/23. -01/25/23 now with diarrhea; change Senokot-S to BID PRN, leave Dulcolax supp in place, monitor for now -2/20, BMx 2 on 2/19- change dulc supp to prn  19. LLE pain: nursing reporting this after assessment this morning 01/18/23, per PT reports-- PT concerned for DVT, no significant swelling noted on exam this morning -01/18/23 ordered doppler U/S bilateral LEs to r/o DVT given hx of malignancy, continue above pain meds -01/20/23 + DVT , increase Eliquis to 10mg  BID x 7d then resume 5mg  BID- may  cont therapy  -01/25/23 scheduled [place] as above -01/30/23 much better pain control, cont to monitor   20.  Vocal cord paralysis due to mediastinal adenopathy compressing recurrent laryngeal nerve LOS: 13 days A FACE TO FACE EVALUATION WAS PERFORMED  Charlett [name] 01/30/2023, 8:32 AM

## 2023-01-30 NOTE — Plan of Care (Signed)
  Problem: Sit to Stand Goal: LTG:  Patient will perform sit to stand with assistance level (PT) Description: LTG:  Patient will perform sit to stand with assistance level (PT) Flowsheets (Taken 01/30/2023 1304) LTG: PT will perform sit to stand in preparation for functional mobility with assistance level: (upgraded based on pt's progress and extended LOS) Contact Guard/Touching assist Note: upgraded based on pt's progress and extended LOS    Problem: RH Bed Mobility Goal: LTG Patient will perform bed mobility with assist (PT) Description: LTG: Patient will perform bed mobility with assistance, with/without cues (PT). Flowsheets (Taken 01/30/2023 1304) LTG: Pt will perform bed mobility with assistance level of: (upgraded based on pt's progress and extended LOS) Supervision/Verbal cueing Note: upgraded based on pt's progress and extended LOS    Problem: RH Car Transfers Goal: LTG Patient will perform car transfers with assist (PT) Description: LTG: Patient will perform car transfers with assistance (PT). Flowsheets (Taken 01/30/2023 1304) LTG: Pt will perform car transfers with assist:: (upgraded based on pt's progress and extended LOS) Contact Guard/Touching assist Note: upgraded based on pt's progress and extended LOS    Problem: RH Ambulation Goal: LTG Patient will ambulate in home environment (PT) Description: LTG: Patient will ambulate in home environment, # of feet with assistance (PT). Flowsheets (Taken 01/30/2023 1304) LTG: Pt will ambulate in home environ  assist needed:: (upgraded based on pt's progress and extended LOS) Contact Guard/Touching assist LTG: Ambulation distance in home environment: 63ft using LRAD Note: upgraded based on pt's progress and extended LOS

## 2023-01-30 NOTE — Progress Notes (Signed)
Occupational Therapy Session Note  Patient Details  Name: Patricia [name] MRN: [id number] Date of Birth: 1946-11-25  Today's Date: 01/30/2023 OT Individual Time: 0174-9449 OT Individual Time Calculation (min): 30 min    Short Term Goals: Week 2:  OT Short Term Goal 1 (Week 2): STG=LTG d/t Pt ELOS  Skilled Therapeutic Interventions/Progress Updates:     Pt received semi-reclined in bed presenting to be in good spirits and receptive to skilled OT session with Pt reporting 0/10 pain during session. Pt transitioned to EOB with supervision. Pt donned shoes and socks seated EOB for dynamic sitting balance challenge with OT providing education on positioning to increase stability and body awareness. Pt noted to posteriorly loose sitting balance x3 without LLE supported on floor, however able to lift trunk to return to COG with mod VB cueing and min tactile cues to facilitate weight shifting/core engagement. Single loss of balance to L side when donning R socks.  Pt initiating sit>standing by pulling up on RW requiring mod questioning cues to recall hand placement. Re-educated Pt on body mechanics and hand positioning to support carry over and increase Pt safety. Pt sit>stand CGA ambulating to sink with RW. Pt completed grooming/hygiene tasks in standing with occasional single UE support on sink during functional cross body reaching outside COG. Pt able to maintain balance with CGA!! Pt demonstrating increased awareness this session when leaning posteriorly correcting with min VB and tactile cueing. Mod cues required to instruct Pt to focus on task vs. Discussing off topic conversations with therapist.  Pt requesting to use restroom ambulating with CGA to min A when stepping over bathroom threshold and turning to sit on toilet. Pt able to complete 3/3 toileting tasks with light min A to adjust pants over bottom using grab bars to balance. Ambulated to sink and washed hands in standing using grab RW with  light min A. Pt receptive to staying up in wc. Pt left with seat belt alarm on, call bell in reach, and all needs met.   Therapy Documentation Precautions:  Precautions Precautions: Fall, Other (comment) Precaution Comments: L hemiparesis, L lean Restrictions Weight Bearing Restrictions: No General:   Vital Signs: Therapy Vitals Temp: 98.2 F (36.8 C) Pulse Rate: 65 Resp: 16 BP: (Abnormal) 141/89 Patient Position (if appropriate): Lying Oxygen Therapy SpO2: 97 % O2 Device: Room Air Pain:   ADL: ADL Equipment Provided: Reacher Eating: Not assessed (based on observation pt is s/u assist does present with swallowing challenges encouraged to tuck chin to reduce incidence for adverse response.) Where Assessed-Eating: Wheelchair Grooming: Setup Where Assessed-Grooming: Sitting at sink Upper Body Bathing: Setup Where Assessed-Upper Body Bathing: Sitting at sink Lower Body Bathing: Minimal assistance, Moderate assistance Where Assessed-Lower Body Bathing: Sitting at sink Upper Body Dressing: Setup Where Assessed-Upper Body Dressing: Wheelchair Lower Body Dressing: Minimal assistance, Moderate assistance Where Assessed-Lower Body Dressing: Wheelchair Toileting: Modified independent, Contact guard (secondary to challenges with transfer from w/c to commode needing additional time) Where Assessed-Toileting: Glass blower/designer: Moderate assistance, Minimal assistance (using grab bar and additional time.) Toilet Transfer Method: Stand pivot Toilet Transfer Equipment: Grab bars (grab bar and arm of w/c) Tub/Shower Transfer: Minimal assistance, Moderate assistance (based on observation of toilet transfer.) Social research officer, government: Not assessed  Therapy/Group: Individual Therapy  [name] 01/30/2023, 7:58 AM

## 2023-01-30 NOTE — Progress Notes (Signed)
Speech Language Pathology Weekly Progress and Session Note  Patient Details  Name: Patricia [name] MRN: [id number] Date of Birth: August 23, 1946  Beginning of progress report period: January 23, 2023 End of progress report period: January 30, 2023  Today's Date: 01/30/2023 SLP Individual Time: 0730-0800 SLP Individual Time Calculation (min): 30 min  Short Term Goals: Week 2: SLP Short Term Goal 1 (Week 2): STG=LTG due to ELOS SLP Short Term Goal 1 - Progress (Week 2): Progressing toward goal    New Short Term Goals: Week 3: SLP Short Term Goal 1 (Week 3): STGs=LTGs due to ELOS  Weekly Progress Updates: Patient continues to make progress towards her LTGs and due to progress, patient's discharge date has been extended to maximize her overall functional independence upon discharge in order to reduce caregiver burden. Currently, patient is consuming Dys. 3 textures with thin liquids via the Provale cup with minimal overt s/s of aspiration and intermittent supervision level verbal cues for use of swallowing compensatory strategies.  Patient prefers to stay on softer foods at this time, therefore, trials of regular textures have not been attempted. Overall, patient requires Min A verbal cues to complete functional and familiar tasks safely in regards to sustained attention, emergent awareness, and recall. Patient and family education ongoing. Patient would benefit from continued skilled SLP intervention to maximize her swallowing and cognitive functioning prior to discharge in order to reduce caregiver burden.      Intensity: Minumum of 1-2 x/day, 30 to 90 minutes Frequency: 3 to 5 out of 7 days Duration/Length of Stay: 02/05/23 Treatment/Interventions: Cognitive remediation/compensation;Environmental controls;Cueing hierarchy;Functional tasks;Therapeutic Activities;Internal/external aids;Therapeutic Exercise;Dysphagia/aspiration precaution training;Patient/family education   Daily  Session  Skilled Therapeutic Interventions:  Skilled treatment session focused on cognitive goals. Upon arrival, patient was awake in bed with 2 different cups containing straws. Patient reported she only used the straw in the hot chocolate "since it was hot" but did not demonstrate any overt s/s of aspiration due to taking "tiny sips." SLP provided education regarding using the Provale cup to reduce risk of aspiration. Patient requested to use the bathroom and required Mod verbal cues for sequencing and problem solving throughout task as patient initially reaches to clinician to use to "pull up on" vs pushing from the wheelchair/bed. Patient reported that she recalled a conversation with the PT regarding writing down the steps of a transfer to maximize recall and carryover. Patient was continent of bowel and bladder. Patient transferred back to bed per her request and handed off to another SLP. Continue with current plan of care.      Pain No/Denies Pain   Therapy/Group: Individual Therapy  Renard Caperton 01/30/2023, 6:49 AM

## 2023-01-30 NOTE — Plan of Care (Signed)
  Problem: RH Attention Goal: LTG Patient will demonstrate this level of attention during functional activites (SLP) Description: LTG:  Patient will will demonstrate this level of attention during functional activites (SLP) Flowsheets (Taken 01/30/2023 [phone]) Patient will demonstrate during cognitive/linguistic activities the attention type of: Sustained LTG: Patient will demonstrate this level of attention during cognitive/linguistic activities with assistance of (SLP): Supervision Note: Goal downgraded   Problem: RH Awareness Goal: LTG: Patient will demonstrate awareness during functional activites type of (SLP) Description: LTG: Patient will demonstrate awareness during functional activites type of (SLP) Flowsheets (Taken 01/30/2023 [phone]) Patient will demonstrate during cognitive/linguistic activities awareness type of: Emergent LTG: Patient will demonstrate awareness during cognitive/linguistic activities with assistance of (SLP): Supervision Note: Goal downgraded

## 2023-01-30 NOTE — Group Note (Signed)
Patient Details Name: Patricia [name] MRN: [id number] DOB: 1946-07-11 Today's Date: 01/30/2023  Group Description: Stress management: Pt participated in group session with a focus on stress mgmt, education provided on healthy coping strategies, and social interaction. Focus of session on providing coping strategies to manage new diagnosis to allow for improved mental health to increase overall quality of life . Discussed how to break down stressors into "daily hassles," "major life stressors" and "life circumstances" in an effort to allow pts to chunk their stressors into groups and determine where to best put their efforts/time when dealing with stress. Provided active listening, emotional support and therapeutic use of self. Offered education on factors that protect [name] against stress such as "daily uplifts," "healthy coping strategies" and "protective factors." Encouraged all group members to make an effort to actively recall one event from their day that was a daily uplift in an effort to protect their mindset from stressors as well as sharing this information with their caregivers to facilitate improved caregiver communication and decrease overall burden of care.  Issued pt handouts on healthy coping strategies to implement into routine.   Individual level documentation: Patient participated with full collaboration during session.   Pain:no c/o   [name] 01/30/2023, 4:03 PM

## 2023-01-30 NOTE — Evaluation (Signed)
Recreational Therapy Assessment and Plan  Patient Details  Name: Patricia [name] MRN: [id number] Date of Birth: [birth year] Today's Date: 01/30/2023  Rehab Potential:  Good ELOS:   /c 2/28  Assessment Hospital Problem: Principal Problem:   Embolic stroke [facility])     Past Medical History:      Past Medical History:  Diagnosis Date   Depression     GERD (gastroesophageal reflux disease)     Hypercholesterolemia     IFG (impaired fasting glucose)     Osteopenia     PONV (postoperative nausea and vomiting)      Past Surgical History:       Past Surgical History:  Procedure Laterality Date   ABDOMINAL HYSTERECTOMY       CATARACT EXTRACTION W/PHACO   09/12/2011    Procedure: CATARACT EXTRACTION PHACO AND INTRAOCULAR LENS PLACEMENT ([place]);  Surgeon: Tonny Branch;  Location: [facility];  Service: Ophthalmology;  Laterality: Left;  CDE: 8.91   CATARACT EXTRACTION W/PHACO   11/18/2011    Procedure: CATARACT EXTRACTION PHACO AND INTRAOCULAR LENS PLACEMENT (IOC);  Surgeon: Tonny Branch;  Location: [facility];  Service: Ophthalmology;  Laterality: Right;  CDE:10.26   COLONOSCOPY   2007    Dr. Gala Romney: internal hemorrhoids, single anal papilla poor prep.    COLONOSCOPY N/A 03/27/2017    Rourk: Normal exam.   ECTOPIC PREGNANCY SURGERY        removal of right tube   ESOPHAGOGASTRODUODENOSCOPY (EGD) WITH PROPOFOL N/A 09/10/2021    Procedure: ESOPHAGOGASTRODUODENOSCOPY (EGD) WITH PROPOFOL;  Surgeon: Daneil Dolin, MD;  Location: [facility];  Service: Endoscopy;  Laterality: N/A;  8:15AM   MALONEY DILATION N/A 09/10/2021    Procedure: Venia Minks DILATION;  Surgeon: Daneil Dolin, MD;  Location: [facility];  Service: Endoscopy;  Laterality: N/A;   OVARIAN CYST REMOVAL        right   RECTOCELE REPAIR N/A 01/18/2014    Procedure: POSTERIOR REPAIR (RECTOCELE);  Surgeon: Jonnie Kind, MD;  Location: [facility];  Service: Gynecology;  Laterality: N/A;   VAGINAL HYSTERECTOMY N/A 01/18/2014    Procedure:  HYSTERECTOMY VAGINAL;  Surgeon: Jonnie Kind, MD;  Location: [facility];  Service: Gynecology;  Laterality: N/A;      Assessment & Plan Clinical Impression: Patient is a 77 y.o. year old female with history of GERD, dysphagia s/p esophageal dilatation, L-VC paralysis, depression, Stage IV NSLC w/extensive lymphadenopathy diagnosed 12/23 and started on chemo last month. She was admitted on 01/10/23 with 2 week history of HA who developed acute onset of LLE weakness and vertigo with difficulty walking on day of admission. MRI brain done revealing small scattered acute and subacute infarcts in bilateral cerebral hemispheres and enhancing nodule right frontal cortex likley subacute infarct w/recommendations to repeat in 6-8 weeks to rule out malignancy. Neurology felt that stroke embolic v/s due to hypercoag state due to cancer. 2D echo showed EF 60-65% with grade IDD.    She was started on DAPT with question of DOAC was discussed with Dr. Earlie Server. She was started on Eliquis 02/06 with recs to repeat MRI in 6-8 weeks. MBS done 02/05 revealing normal swallow function and D3, thins recommended with limited distractions. She continues to be limited by left sided weakness with left lean, decreased awareness of deficits, poor attention with verbosity and cognitive deficits affecting ADLs and mobility. CIR recommended due to functional decline. Patient transferred to CIR on 01/17/2023.    Pt presents with decreased activity tolerance, decreased functional  mobility, decreased balance, decreased vision, decreased attention, decreased awareness, decreased memory, and delayed processing,  feelings of stress Limiting pt's independence with leisure/community pursuits.  Met with pt today to discuss TR services including leisure education, activity analysis/modifications and stress management.  Also discussed the importance of social, emotional, spiritual health in addition to physical health and their effects on overall  health and wellness.  Pt stated understanding.  Husband present.     Plan  Pt will participate in stress management/coping group >45 minutes during LOS  Recommendations for other services: None   Discharge Criteria: Patient will be discharged from TR if patient refuses treatment 3 consecutive times without medical reason.  If treatment goals not met, if there is a change in medical status, if patient makes no progress towards goals or if patient is discharged from hospital.  The above assessment, treatment plan, treatment alternatives and goals were discussed and mutually agreed upon: by patient  [place] 01/30/2023, 4:00 PM

## 2023-01-30 NOTE — Progress Notes (Signed)
Patient ID: Patricia [name], female   DOB: 1946/09/03, 77 y.o.   MRN: [id number]  [employer] waiting on co-payment for wheelchair before delivery.

## 2023-01-30 NOTE — Progress Notes (Signed)
Physical Therapy Weekly Progress Note  Patient Details  Name: Patricia [name] MRN: [id number] Date of Birth: 12-Aug-1946  Beginning of progress report period: January 23, 2023 End of progress report period: January 30, 2023  Today's Date: 01/30/2023 PT Individual Time: 1107-1201 and 2951-8841 PT Individual Time Calculation (min): 54 min and 23 min  Patient has met 0 of 1 short term goals due to none being set at that time based on ELOS; however, based on team discussion, extended pt's D/C date to 2/28 due to no sooner Oncology appointments being available and pt continuing to make steady progress with therapy to allow her to achieve a higher functional mobility level. [name] is continuing to make stedy progress with therapy demonstrating increasing independence with functional mobility performing bed mobility supervision using bed features (will have hospital bed), sit<>stand and stand pivot transfers using RW with min assist progressing towards CGA, and ambulating up to 179ft using RW with CGA/light min assist. She continues to have significant balance impairments as noted by score of 15/56 on Berg Balance Test; however, this is a clinically significant improvement compared to the 5/56 only 1 week prior. She will benefit from continued CIR level skilled physical therapy to continue progressing her independence with functional mobility prior to D/Cing home with 24hr support from husband and granddaughter.  Patient continues to demonstrate the following deficits muscle weakness, decreased cardiorespiratoy endurance, impaired timing and sequencing, unbalanced muscle activation, and decreased motor planning, decreased visual motor skills, decreased midline orientation and decreased attention to left, decreased attention, decreased awareness, decreased problem solving, decreased safety awareness, decreased memory, and delayed processing, and decreased sitting balance, decreased standing balance,  decreased postural control, and decreased balance strategies and therefore will continue to benefit from skilled PT intervention to increase functional independence with mobility.  Patient progressing toward long term goals and due to extension in POC and pt's progress thus far, were able to re-upgrade some goals back to CGA level.  Plan of care revisions: extended LOS as described above.  PT Short Term Goals Week 2:  PT Short Term Goal 1 (Week 2): = to LTGs based on ELOS PT Short Term Goal 1 - Progress (Week 2): Progressing toward goal Week 3:  PT Short Term Goal 1 (Week 3): = to LTGs based on ELOS  Skilled Therapeutic Interventions/Progress Updates:  Ambulation/gait training;Community reintegration;DME/adaptive equipment instruction;Neuromuscular re-education;Psychosocial support;Stair training;UE/LE Strength taining/ROM;Wheelchair propulsion/positioning;Balance/vestibular training;Discharge planning;Functional electrical stimulation;Pain management;Skin care/wound management;Therapeutic Activities;UE/LE Coordination activities;Cognitive remediation/compensation;Disease management/prevention;Functional mobility training;Patient/family education;Splinting/orthotics;Therapeutic Exercise;Visual/perceptual remediation/compensation  Session 1: Pt received sitting in w/c with her husband, [name], and step son present and pt agreeable to therapy session.   Stand pivot w/c<>EOB, no AD, with CGA for steadying/safety and cuing for stepping to turn fully prior to initiating sitting down - pt demos significant improvement in her balance compared to last week.  Therapist educated pt's husband on how to fold-up and pick up wheelchair to place in/out of vehicle with him demonstrating understanding.  Transported to/from gym in w/c for time management and energy conservation.  Gait training up/down ramp using RW with CGA primarily and 1x min assist due to minor posterior LOB when initially getting up on the  ramp. Repeated with pt's husband providing hands-on assistance and demonstrating excellent technique and cuing to ensure pt safety.  Pt's husband reports no additional questions/concerns and reports feeling confident assisting pt at D/C.  Therapist notified SW that therapist should have requested pt receive a youth RW and asked they be swapped.  Gait training 142ft using L HHA with primarily CGA and only occasional light min assist for balance. Pt demonstrating the following gait deviations with therapist providing the described cuing and facilitation for improvement:  - continues to have decreased L LE foot clearance and step length that gradually leads to L anterior LOB if not corrected with verbal cuing - pt demos improved ability to dual-task while walking, waving to individuals in hallway without increased LOB - no significant LOB noted throughout  Gait training using L HHA 142ft + 35ft as described above, but added 2.5lb on L LE to provide increased challenge to target pt's focus on her L LE gait mechanics - demos improved L LE foot clearance via increased hip/knee flexion and increased step length with mod/max cuing progressed to only min cuing   Stair navigation training ascending/descending 4 steps (6" height) using B HRs with min assist for balance especially on descent due to posterior lean - cuing for reciprocal stepping pattern to target L LE NMR with pt having difficulty sequencing it in the moment - difficulty lifting L LE to place up or down on next step due to insufficient motor recruitment.  Transported back to room and pt left seated in w/c in the care of her husband.  Session 2: Pt received sitting in w/c with her husband, [name], present and pt agreeable to therapy session. Pt's husband with questions regarding handicap placard - deferred to SW. Pt's new youth RW was delivered to her room - therapist adjusted to ensure proper height with significant improvement in fit for  patient.  Transported to ADL apartment in w/c for time management and energy conservation. Gait training ~143ft using RW navigating through ADL apartment kitchen and bathroom then down to ortho gym to address turning and AD management while targeting endurance - CGA throughout for safety but no overt LOBs this session. At end of session, pt left seated in w/c in care of COTA for group session.  Therapy Documentation Precautions:  Precautions Precautions: Fall, Other (comment) Precaution Comments: L hemiparesis, L lean Restrictions Weight Bearing Restrictions: No   Pain:  Session 1: No reports of pain throughout session.  Session 2: No reports of pain throughout session.   Therapy/Group: Individual Therapy  Tawana Scale , PT, DPT, NCS, CSRS 01/30/2023, 7:54 AM

## 2023-01-30 NOTE — Progress Notes (Signed)
Patient ID: Patricia [name], female   DOB: 20-Aug-1946, 77 y.o.   MRN: [id number]  Youth RW ordered through [place].

## 2023-01-30 NOTE — Group Note (Signed)
Patient Details Name: Patricia [name] MRN: [id number] DOB: 1946-06-28 Today's Date: 01/30/2023  Time Calculation: OT Group Time Calculation OT Group Start Time: 4431 OT Group Stop Time: 1525 OT Group Time Calculation (min): 60 min      Group Description: Stress management: Pt participated in group session with a focus on stress mgmt, education provided on healthy coping strategies, and social interaction. Focus of session on providing coping strategies to manage new diagnosis to allow for improved mental health to increase overall quality of life . Discussed how to break down stressors into "daily hassles," "major life stressors" and "life circumstances" in an effort to allow pts to chunk their stressors into groups and determine where to best put their efforts/time when dealing with stress. Provided active listening, emotional support and therapeutic use of self. Offered education on factors that protect [name] against stress such as "daily uplifts," "healthy coping strategies" and "protective factors." Encouraged all group members to make an effort to actively recall one event from their day that was a daily uplift in an effort to protect their mindset from stressors as well as sharing this information with their caregivers to facilitate improved caregiver communication and decrease overall burden of care.  Issued pt handouts on healthy coping strategies to implement into routine.   Individual level documentation: Patient participated with full collaboration during session.   Pain: Pain Assessment Pain Scale: 0-10 Pain Score: 0-No pain    [name] 01/30/2023, 3:55 PM

## 2023-01-31 LAB — BASIC METABOLIC PANEL
Anion gap: 8 (ref 5–15)
BUN: 16 mg/dL (ref 8–23)
CO2: 21 mmol/L — ABNORMAL LOW (ref 22–32)
Calcium: 9.3 mg/dL (ref 8.9–10.3)
Chloride: 111 mmol/L (ref 98–111)
Creatinine, Ser: 0.81 mg/dL (ref 0.44–1.00)
GFR, Estimated: >60 mL/min (ref >60)
Glucose, Bld: 110 mg/dL — ABNORMAL HIGH (ref 70–99)
Potassium: 3.8 mmol/L (ref 3.5–5.1)
Sodium: 140 mmol/L (ref 135–145)

## 2023-01-31 NOTE — Plan of Care (Signed)
  Problem: RH Dressing Goal: LTG Patient will perform lower body dressing w/assist (OT) Description: LTG: Patient will perform lower body dressing with assist, with/without cues in positioning using equipment (OT) Flowsheets (Taken 01/31/2023 1609) LTG: Pt will perform lower body dressing with assistance level of: Contact Guard/Touching assist Note: upgraded based on pt's progress and extended LOS    Problem: RH Toileting Goal: LTG Patient will perform toileting task (3/3 steps) with assistance level (OT) Description: LTG: Patient will perform toileting task (3/3 steps) with assistance level (OT)  Flowsheets (Taken 01/31/2023 1609) LTG: Pt will perform toileting task (3/3 steps) with assistance level: Contact Guard/Touching assist Note: upgraded based on pt's progress and extended LOS    Problem: RH Toilet Transfers Goal: LTG Patient will perform toilet transfers w/assist (OT) Description: LTG: Patient will perform toilet transfers with assist, with/without cues using equipment (OT) Flowsheets (Taken 01/31/2023 1609) LTG: Pt will perform toilet transfers with assistance level of: Contact Guard/Touching assist Note: upgraded based on pt's progress and extended LOS

## 2023-01-31 NOTE — Progress Notes (Signed)
Physical Therapy Session Note  Patient Details  Name: Patricia [name] MRN: [id number] Date of Birth: 17-Apr-1946  Today's Date: 01/31/2023 PT Individual Time: 0952-1050 PT Individual Time Calculation (min): 58 min   Short Term Goals: Week 1:  PT Short Term Goal 1 (Week 1): Pt will perform supine<>sit with min assist PT Short Term Goal 1 - Progress (Week 1): Met PT Short Term Goal 2 (Week 1): Pt will perform sit<>stands using LRAD with min assist PT Short Term Goal 2 - Progress (Week 1): Progressing toward goal PT Short Term Goal 3 (Week 1): Pt will perform bed<>chair transfers using LRAD with min assist PT Short Term Goal 3 - Progress (Week 1): Progressing toward goal PT Short Term Goal 4 (Week 1): Pt will ambulate at least 5f using LRAD with mod assist of 1 (+2 w/c follow if needed) PT Short Term Goal 4 - Progress (Week 1): Met PT Short Term Goal 5 (Week 1): Pt will initiate stair navigation training PT Short Term Goal 5 - Progress (Week 1): Not met Week 2:  PT Short Term Goal 1 (Week 2): = to LTGs based on ELOS PT Short Term Goal 1 - Progress (Week 2): Progressing toward goal  Skilled Therapeutic Interventions/Progress Updates:    Pt received supine in bed and agreeable to therapy. Pt removed socks in supine by bringing BLE up to reach socks independently; then sat at EOB CGA from therapist's hand. Pt shoes donned in sitting w/ Min A. Pt performed a stand [facility] A w/ cueing for proper hand and foot placement. [facility].  Gait training ~150 ft around nurses station w/ RW and Min A +2 for WC follow and RW management.  Pt demonstrated decreased stride length more so on the L than R. When attempting to turn w/ the RW pt would get slightly off balance and stepped R foot behind the L w/ increased adduction causing her to lean more towards the L. Brought this to pts awareness and w/ minor wt shift facilitation to the R  was able to correct her foot placement and increase BOS. Pt navigated around 3 cones ~10 ft w/ therapist in front providing HHA. Therapist providing B lateral wt shifts at pelvis to encourage BLE advancement. Pt's L foot demonstrated toe drag which caused the pt to get out of rhythm. Pt attempted to correct this by speeding up. Therapist instructed pt to slow down and focus on the motor planning of each step. Postural sway to the L when pt's BOS became narrow. Cueing to increase BOS assisted in pt finding midline. Pt easily distracted by environment when ambulating which resulted in poor sequencing of movement. Standing marches w/ HHA 1x10 B. Pt instructed to march knees up and slowly bring foot down emphasizing motor control. Pt required mod vc to increase hip flexion on LLE and was able to demonstrate understanding, although inconsistently. Pt transported back to room dependently and [facility], call bell in reach and all needs met.   Therapy Documentation Precautions:  Precautions Precautions: Fall, Other (comment) Precaution Comments: L hemiparesis, L lean Restrictions Weight Bearing Restrictions: No General:    Pain: Pt denies any pain        Therapy/Group: Individual Therapy  Katharin Schneider 01/31/2023, 11:46 AM

## 2023-01-31 NOTE — Progress Notes (Addendum)
Speech Language Pathology Daily Session Note  Patient Details  Name: Patricia [name] MRN: [id number] Date of Birth: 18-Jan-1946  Today's Date: 01/31/2023 SLP Individual Time: 1105-1200 SLP Individual Time Calculation (min): 55 min  Short Term Goals: Week 3: SLP Short Term Goal 1 (Week 3): STGs=LTGs due to ELOS  Skilled Therapeutic Interventions: Pt seen this date for skilled ST intervention targeting cognitive and swallowing goals outlined in care plan. Pt received awake/alert and OOB in w/c, family present. Agreeable to intervention in speech office. Appearing fatigued as session progressed - had a full morning of therapies.  SLP facilitated today's session by providing overall Max A verbal and visual cues for participation in card game (UNO) to address attention, recall, visual scanning, and problem-solving. Max A for engaging in eye contact when communicating with SLP positioned within L visual field. Max A, faded to Min A for scanning L during card game. Given hypophonia in the setting of vocal cord paralysis, SLP provided voice amplifier to assess if this would be helpful in improving speech comprehensibility to communication partner; however, this did not change comprehensibility much, if any at all. Demonstrated emergent awareness with Sup to Min A verbal cues. Recalled morning d/w MD with Sup A (accuracy verify by MD note).  Tolerated thin liquids via 5 cc Provale cup with no overt s/sx concerning for airway intrusion given set-up assistance.   Pt returned to room and left OOB in w/c with all safety measures activated, call bell within reach, and all immediate needs met. Spouse present. Continue per current ST POC.  Pain No pain reported; NAD  Therapy/Group: Individual Therapy  Sahej Hauswirth A Charmin Aguiniga 01/31/2023, 2:52 PM

## 2023-01-31 NOTE — Progress Notes (Signed)
Patient ID: Patricia [name], female   DOB: 12/04/1946, 77 y.o.   MRN: [id number]  New handicapped placement card application provided in pt's room.

## 2023-01-31 NOTE — Progress Notes (Addendum)
Occupational Therapy Weekly Progress Note  Patient Details  Name: Patricia [name] MRN: [id number] Date of Birth: 1946/05/16  Beginning of progress report period: January 24, 2023 End of progress report period: January 31, 2023  Today's Date: 01/31/2023 OT Individual Time: [id number] OT Individual Time Calculation (min): 58 min  15 missed minutes d/t fatigue/head ache  Patient has met 0 of 1 short term goals d/t none being set at the time based on ELOS. However, based on medical team discussion, team decision to extend Pt d/c date to 02/28 d/t no sooner oncology appointments being available and Pt continuing to make steady progress with her therapy to allow her to achieve a higher functional mobility level and increase safety for d/c. Pt is making steady progress and is completing toileting consistently with light min A for balance, UB dressing/bathing supervision, LB dressing/bathing CGA, and toilet/shower transfers with light min A.   Patient continues to demonstrate the following deficits: muscle weakness, decreased cardiorespiratoy endurance, impaired timing and sequencing, decreased coordination, and decreased motor planning, decreased visual acuity and decreased visual perceptual skills, decreased attention to left and decreased motor planning, decreased initiation, decreased attention, decreased awareness, decreased problem solving, decreased safety awareness, and decreased memory, and decreased sitting balance, decreased standing balance, decreased postural control, and decreased balance strategies and therefore will continue to benefit from skilled OT intervention to enhance overall performance with BADL, iADL, and Reduce care partner burden.  Patient progressing toward long term goals..  Plan of care revisions: extended LOS with toileting, LB dressing, and toilet transfer goals upgraded d/t Pt current progress.  OT Short Term Goals Week 1:  OT Short Term Goal 1 (Week 1): The pt will  transfer to all surfaces with MinA at 95% safe OT Short Term Goal 1 - Progress (Week 1): Met OT Short Term Goal 2 (Week 1): The pt will complete LB dressing with MinA at 95% safe OT Short Term Goal 2 - Progress (Week 1): Met OT Short Term Goal 3 (Week 1): The pt will tolerate >45 minutes of activity with minimal rest breaks @ 95% safe OT Short Term Goal 3 - Progress (Week 1): Not met OT Short Term Goal 4 (Week 1): The pt will complete toileting with MinA by incorporating the grab bars  @ 95% safe OT Short Term Goal 4 - Progress (Week 1): Met Week 2:  OT Short Term Goal 1 (Week 2): STG=LTG d/t Pt ELOS OT Short Term Goal 1 - Progress (Week 2): Progressing toward goal  Skilled Therapeutic Interventions/Progress Updates:     1300: Attempted to see Pt for regularly scheduled therapy session. Pt sleeping upon OT arrival, easy to wake. Pt reporting head ache and fatigue stating she had received medications from Rn, but needed some time to rest and let the medicine start to work. Provided Pt therapeutic support and motivation, however Pt needing to rest at this time. Informed Pt OT would be back to see her later this after noon as able with Pt receptive.  [id number]: Returned to see Pt. Pt received lightly sleeping in bed with husband present in room. Pt reporting decrease in head ache symptoms and was receptive to skilled OT session with a focus on functional cognition, dynamic standing balance, and activity tolerance.  Pt requesting to use restroom at beginning of session. Transitioned to EOB with supervision using bed features with increased time d/t fatigue. Pt donned socks and shoes sitting EOB crossing legs into figure-4 position, however requiring CGA and mod cueing to  maintain seated balance as Pt unaware when she was falling over to L side. Sit>stand CGA with mod cueing for hand placement. Ambulated to bathroom light min A using RW and transferred to toilet using grab bars. Upon arriving to toilet pt  stating "what do I do now" requiring mod questioning cues to doff pants and recall hand placement for balance. Pt able to complete anterior pericare and clothing management with light min A for balance. Ambulated to sink and stood to wash hands using RW with CGA.  Pt transported total A to therapy gym for time management and energy conservation. Pt participated in standing connect-4 game x3 trials with rest breaks provided between trials. Pt educated on game verbalizing understanding prior to start. Pt stood with RW to sort game pieces into red and yellow requiring mod cueing to recall her personal color of choice. During game, Pt requiring mod cueing to attention to task and problem solve strategies. Pt also requiring max direct cueing to count number of game chips (9) that OT had played vs. Number of game chips she had played (10). Pt reporting "I am too tired for my brain to work". Allowed Pt increased rest break at end of session and participated in light hearted conversation to improve Pt moral.  Pt transported back to room total A in wc. Pt transitioned back to bed with min HHA. Pt encouraged to continue water intake to prevent dehydration d/t Pt reporting "I don't want to drink water, because I will need to get out of bed to pee". Provided re-education on call bell use and availability of staff to assist Pt to bathroom. Pt receptive to education. Pt was left resting in bed with call bell in reach, bed alarm on, and all needs met.  Therapy Documentation Precautions:  Precautions Precautions: Fall, Other (comment) Precaution Comments: L hemiparesis, L lean Restrictions Weight Bearing Restrictions: No General: General OT Amount of Missed Time: 15 Minutes Vital Signs: Therapy Vitals Temp: 97.9 F (36.6 C) Pulse Rate: 71 Resp: 16 BP: (Abnormal) 143/85 Patient Position (if appropriate): Lying Oxygen Therapy SpO2: 97 % O2 Device: Room Air Pain:   ADL: ADL Equipment Provided:  Reacher Eating: Not assessed (based on observation pt is s/u assist does present with swallowing challenges encouraged to tuck chin to reduce incidence for adverse response.) Where Assessed-Eating: Wheelchair Grooming: Setup Where Assessed-Grooming: Sitting at sink Upper Body Bathing: Setup Where Assessed-Upper Body Bathing: Sitting at sink Lower Body Bathing: Minimal assistance, Moderate assistance Where Assessed-Lower Body Bathing: Sitting at sink Upper Body Dressing: Setup Where Assessed-Upper Body Dressing: Wheelchair Lower Body Dressing: Minimal assistance, Moderate assistance Where Assessed-Lower Body Dressing: Wheelchair Toileting: Modified independent, Contact guard (secondary to challenges with transfer from w/c to commode needing additional time) Where Assessed-Toileting: Glass blower/designer: Moderate assistance, Minimal assistance (using grab bar and additional time.) Toilet Transfer Method: Stand pivot Toilet Transfer Equipment: Grab bars (grab bar and arm of w/c) Tub/Shower Transfer: Minimal assistance, Moderate assistance (based on observation of toilet transfer.) Social research officer, government: Not assessed   Therapy/Group: Individual Therapy  [name] 01/31/2023, 4:00 PM

## 2023-01-31 NOTE — Progress Notes (Signed)
PROGRESS NOTE   Subjective/Complaints:  No new issues, is ok with longer LOS   ROS:  Denies fevers, chills, CP, SOB, abd pain, N/V/C  Objective:   No results found. No results for input(s): "WBC", "HGB", "HCT", "PLT" in the last 72 hours.   Recent Labs    01/31/23 0722  NA 140  K 3.8  CL 111  CO2 21*  GLUCOSE 110*  BUN 16  CREATININE 0.81  CALCIUM 9.3      Intake/Output Summary (Last 24 hours) at 01/31/2023 0856 Last data filed at 01/31/2023 0852 Gross per 24 hour  Intake 828 ml  Output --  Net 828 ml         Physical Exam: Vital Signs Blood pressure (!) 157/80, pulse 67, temperature 97.9 F (36.6 C), resp. rate 18, height 4' 9.99" (1.473 m), weight 63.1 kg, SpO2 97 %.   General: No acute distress, laying in bed Mood and affect are appropriate, less tearful this week Heart: Regular rate and rhythm no rubs murmurs or extra sounds Lungs: Clear to auscultation, breathing unlabored, no rales, rhonchi, or wheezes Abdomen: Positive bowel sounds, soft nontender to palpation, nondistended Extremities: No clubbing, cyanosis, or edema, left calf tenderness mild and seems improved today, no swelling or erythema   Skin: No evidence of breakdown, no evidence of rash  Prior exam: MSK:      No apparent deformity.      Strength:               5/5 RUE and RLE, 5- LUE and LLE   Neurologic exam:  Cognition: AAO to person, place, time and event.  Voice:  +Hoarse, breathy vocal quality.   Mood: Pleasant affect, appropriate mood.  Sensation: To light touch intact in BL UEs and LEs    Spasticity: MAS 0 in all extremities.     Assessment/Plan: 1. Functional deficits which require 3+ hours per day of interdisciplinary therapy in a comprehensive inpatient rehab setting. Physiatrist is providing close team supervision and 24 hour management of active medical problems listed below. Physiatrist and rehab team  continue to assess barriers to discharge/monitor patient progress toward functional and medical goals  Care Tool:  Bathing    Body parts bathed by patient: Right arm, Left arm, Chest, Abdomen, Front perineal area, Buttocks, Right upper leg, Left upper leg, Face   Body parts bathed by helper: Left lower leg, Right lower leg     Bathing assist Assist Level: Minimal Assistance - Patient > 75%     Upper Body Dressing/Undressing Upper body dressing   What is the patient wearing?: Pull over shirt    Upper body assist Assist Level: Set up assist    Lower Body Dressing/Undressing Lower body dressing      What is the patient wearing?: Pants     Lower body assist Assist for lower body dressing: Moderate Assistance - Patient 50 - 74%     Toileting Toileting    Toileting assist Assist for toileting: Contact Guard/Touching assist     Transfers Chair/bed transfer  Transfers assist     Chair/bed transfer assist level: Minimal Assistance - Patient > 75% (stand pivot with HHA)  Locomotion Ambulation   Ambulation assist      Assist level: Moderate Assistance - Patient 50 - 74% Assistive device: Hand held assist Max distance: 132f   Walk 10 feet activity   Assist     Assist level: 2 helpers Assistive device: Hand held assist   Walk 50 feet activity   Assist Walk 50 feet with 2 turns activity did not occur: Safety/medical concerns         Walk 150 feet activity   Assist Walk 150 feet activity did not occur: Safety/medical concerns         Walk 10 feet on uneven surface  activity   Assist Walk 10 feet on uneven surfaces activity did not occur: Safety/medical concerns         Wheelchair     Assist Is the patient using a wheelchair?: Yes Type of Wheelchair: Manual    Wheelchair assist level: Moderate Assistance - Patient 50 - 74% Max wheelchair distance: 150'    Wheelchair 50 feet with 2 turns activity    Assist         Assist Level: Moderate Assistance - Patient 50 - 74%   Wheelchair 150 feet activity     Assist      Assist Level: Moderate Assistance - Patient 50 - 74%   Blood pressure (!) 157/80, pulse 67, temperature 97.9 F (36.6 C), resp. rate 18, height 4' 9.99" (1.473 m), weight 63.1 kg, SpO2 97 %.  Medical Problem List and Plan: 1. 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 Antithrombotics: -DVT/anticoagulation:  Pharmaceutical: Eliquis 5 mg BID>>inc to '10mg'$  BID x 7d (01/20/23-01/27/23) then resume '5mg'$  BID starting 01/27/23 '@2000'$  -antiplatelet therapy: N/A - was on ASA and Plavix prior to stroke, now on Eliquis only, will be on '10mg'$  BID x7d then resume '5mg'$ , Dr MJulien Nordmannin agreement   3. LBP/Pain Management: Will continue tylenol '650mg'$  q6h PRN for now.  -order ben gay for Left calf pain-- changed to scheduled TID 01/25/23 -Lidoderm patch to neck QD 4. Mood/Behavior/Sleep: LCSW to follow for evaluation and support.                       --Melatonin '6mg'$  QHS PRN, Trazodone prn for insomnia.        -antipsychotic agents: N/A 5. Neuropsych/cognition: This patient is capable of making decisions on her own behalf. 6. Skin/Wound Care: Routine pressure relief measures.   7. Fluids/Electrolytes/Nutrition: Monitor I/O. Monitor weekly labs mild hypoK= will supplement x 2 d and repeat 2/23     Latest Ref Rng & Units 01/31/2023    7:22 AM 01/27/2023    6:18 AM 01/20/2023    8:38 AM  BMP  Glucose 70 - 99 mg/dL 110  117  111   BUN 8 - 23 mg/dL '16  14  12   '$ Creatinine 0.44 - 1.00 mg/dL 0.81  0.88  0.86   Sodium 135 - 145 mmol/L 140  140  136   Potassium 3.5 - 5.1 mmol/L 3.8  3.4  3.4   Chloride 98 - 111 mmol/L 111  107  106   CO2 22 - 32 mmol/L '21  20  23   '$ Calcium 8.9 - 10.3  mg/dL 9.3  9.1  9.1      8. HTN:  Monitor BP TID--continue to hold Valsartan and HCTZ.              --SBP trending 100- 140 range. Avoid hypoperfusion   -01/31/23 BPs creeping up may resume valsartan if trend continues next wk , cont to monitor Vitals:   01/27/23 1358 01/27/23 1947 01/28/23 0355 01/28/23 1341  BP: 135/73 115/76 (!) 144/81 136/76   01/28/23 1930 01/29/23 0305 01/29/23 1549 01/29/23 1920  BP: 132/72 (!) 140/68 130/69 136/82   01/30/23 0507 01/30/23 1335 01/30/23 2001 01/31/23 0508  BP: (!) 141/89 136/89 (!) 150/77 (!) 157/80      9. Stage IV  NSCLC w/lymphadenopathy/muscular nodules: Chemo to resume after discharge. --Per neurology, repeat MRI brain w/ and w/o in 6-8 weeks which would be ` last 2 weeks of March to continue to monitor for potential metastatic disease. Will need neuro outpt f/u 2-4 wks post discharge  10. GERD s/p esophageal dilatation: Continue Protonix '40mg'$  BID. Will add pepcid '20mg'$  BID also.  11. Anxiety/depression: On Paxil '40mg'$  daily w/xanax 0.'5mg'$  QHS prn.   12. Hyperlipidemia: On Crestor '20mg'$  QD  13. Neck pain/Headaches: On PRN Fioricet 2 tabs Q6H, using consistently.  Will add Topamax 25 mg bid.              --lidocaine patch for local measures.              -- Flexeril 5 mg TID PRN for muscle tightness in neck --increased topamax to '50mg'$  BID- reassess for dose  increase in am  -01/25/23 improving, cont regimen  14. Horseness/L-VC paralysis : Has been ongoing since August-->a little worse.               -- SLP for swallow, speech              -- ISB   15. Dizziness/nausea/double vision: R-beating nystagmus on exam. Vestibular eval and treat.  -01/18/23 was getting Zofran inpatient, ordered '4mg'$  ODT q8h PRN n/v; also has Compazine PRN  16. Persistent cough since CVA. Causing cervicalgia, HA's, and nausea/vomiting with PO intakes.              --Treat cough -rule out aspiration/post obstructive infection.                -- Duo nebs qid added w/tessalon perles prn Improving , has  lung ca so would expect some cough   17. Anemia: Hgb down to 10.5 on 01/10/23, stable at 10.7 on 01/20/23; monitor on weekly labs next [id number]  -Continue Folic acid '1mg'$  QD, multivitamins  18. Hx of rectal surgery, chronic constipation:  -01/18/23 LBM yesterday, but requests stool softeners given hx of rectal surgery. Ordered Miralax 17g QD + PRN, Colace '100mg'$  QD + PRN. Has Dulcolax supp PRN, Fleet's PRN.  -01/19/23 no BM in 2 days, doesn't like miralax; change miralax to PRN only, increase Colace to '100mg'$  BID+PRN, add Senna 8.'6mg'$  QD -Given sorbitol x 1 on 01/20/23; D/c'd Colace, Senokot, and Miralax on 01/22/23, started dulcolax supp QD and Senokot-S 3 tabs BID on 01/22/23. -01/25/23 now with diarrhea; change Senokot-S to BID PRN, leave Dulcolax supp in place, monitor for now -2/20, BMx 2 on 2/19- change dulc supp to prn  19. LLE pain: nursing reporting this after assessment this morning 01/18/23, per PT reports-- PT concerned for DVT, no significant swelling noted on exam this morning -01/18/23 ordered doppler U/S bilateral LEs to r/o  DVT given hx of malignancy, continue above pain meds -01/20/23 + DVT , increase Eliquis to '10mg'$  BID x 7d then resume '5mg'$  BID- may cont therapy  -01/25/23 scheduled [place] as above -01/30/23 much better pain control, cont to monitor   20.  Vocal cord paralysis due to mediastinal adenopathy compressing recurrent laryngeal nerve LOS: 14 days A FACE TO FACE EVALUATION WAS PERFORMED  Charlett [name] 01/31/2023, 8:56 AM

## 2023-02-01 DIAGNOSIS — H9201 Otalgia, right ear: Secondary | ICD-10-CM

## 2023-02-01 NOTE — Progress Notes (Signed)
PROGRESS NOTE   Subjective/Complaints:  Doing pretty well, some R ear pain today. Has used flonase before and "didn't work".  Slept ok. Pain ok otherwise. LBM 2 days ago but feels like she might have one today. Peeing normally.   ROS: +R ear pain, +constipation-fluctuating.  Denies fevers, chills, CP, SOB, abd pain, N/V/D.   Objective:   No results found. No results for input(s): "WBC", "HGB", "HCT", "PLT" in the last 72 hours.   Recent Labs    01/31/23 0722  NA 140  K 3.8  CL 111  CO2 21*  GLUCOSE 110*  BUN 16  CREATININE 0.81  CALCIUM 9.3      Intake/Output Summary (Last 24 hours) at 02/01/2023 0710 Last data filed at 01/31/2023 1752 Gross per 24 hour  Intake 474 ml  Output --  Net 474 ml         Physical Exam: Vital Signs Blood pressure 136/75, pulse 68, temperature 97.8 F (36.6 C), resp. rate 17, height 4' 9.99" (1.473 m), weight 62.1 kg, SpO2 96 %.   General: No acute distress, laying in bed Mood and affect are appropriate, happier this week HEENT: R ear with mild serous effusion, no TM erythma or significant bulging, L TM with scant serous effusion, also no TM erythema or bulging.  Heart: Regular rate and rhythm no rubs murmurs or extra sounds Lungs: Clear to auscultation, breathing unlabored, no rales, rhonchi, or wheezes, occasional dry cough.  Abdomen: Positive bowel sounds, soft nontender to palpation, nondistended Extremities: No clubbing, cyanosis, or edema, left calf tenderness mild and seems improved this week, no swelling or erythema   Skin: No evidence of breakdown, no evidence of rash  Prior exam: MSK:      No apparent deformity.      Strength:               5/5 RUE and RLE, 5- LUE and LLE   Neurologic exam:  Cognition: AAO to person, place, time and event.  Voice:  +Hoarse, breathy vocal quality.   Mood: Pleasant affect, appropriate mood.  Sensation: To light touch intact in  BL UEs and LEs    Spasticity: MAS 0 in all extremities.     Assessment/Plan: 1. Functional deficits which require 3+ hours per day of interdisciplinary therapy in a comprehensive inpatient rehab setting. Physiatrist is providing close team supervision and 24 hour management of active medical problems listed below. Physiatrist and rehab team continue to assess barriers to discharge/monitor patient progress toward functional and medical goals  Care Tool:  Bathing    Body parts bathed by patient: Right arm, Left arm, Chest, Abdomen, Front perineal area, Buttocks, Right upper leg, Left upper leg, Face   Body parts bathed by helper: Left lower leg, Right lower leg     Bathing assist Assist Level: Minimal Assistance - Patient > 75%     Upper Body Dressing/Undressing Upper body dressing   What is the patient wearing?: Pull over shirt    Upper body assist Assist Level: Set up assist    Lower Body Dressing/Undressing Lower body dressing      What is the patient wearing?: Pants  Lower body assist Assist for lower body dressing: Moderate Assistance - Patient 50 - 74%     Toileting Toileting    Toileting assist Assist for toileting: Contact Guard/Touching assist     Transfers Chair/bed transfer  Transfers assist     Chair/bed transfer assist level: Minimal Assistance - Patient > 75% (stand pivot with HHA)     Locomotion Ambulation   Ambulation assist      Assist level: Moderate Assistance - Patient 50 - 74% Assistive device: Hand held assist Max distance: 122f   Walk 10 feet activity   Assist     Assist level: 2 helpers Assistive device: Hand held assist   Walk 50 feet activity   Assist Walk 50 feet with 2 turns activity did not occur: Safety/medical concerns         Walk 150 feet activity   Assist Walk 150 feet activity did not occur: Safety/medical concerns         Walk 10 feet on uneven surface  activity   Assist Walk 10  feet on uneven surfaces activity did not occur: Safety/medical concerns         Wheelchair     Assist Is the patient using a wheelchair?: Yes Type of Wheelchair: Manual    Wheelchair assist level: Moderate Assistance - Patient 50 - 74% Max wheelchair distance: 150'    Wheelchair 50 feet with 2 turns activity    Assist        Assist Level: Moderate Assistance - Patient 50 - 74%   Wheelchair 150 feet activity     Assist      Assist Level: Moderate Assistance - Patient 50 - 74%   Blood pressure 136/75, pulse 68, temperature 97.8 F (36.6 C), resp. rate 17, height 4' 9.99" (1.473 m), weight 62.1 kg, SpO2 96 %.  Medical Problem List and Plan: 1. 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 Antithrombotics: -DVT/anticoagulation:  Pharmaceutical: Eliquis 5 mg BID>>inc to '10mg'$  BID x 7d (01/20/23-01/27/23) then resume '5mg'$  BID starting 01/27/23 '@2000'$  -antiplatelet therapy: N/A - was on ASA and Plavix prior to stroke, now on Eliquis only, will be on '10mg'$  BID x7d then resume '5mg'$ , Dr MJulien Nordmannin agreement   3. LBP/Pain Management: Will continue tylenol '650mg'$  q6h PRN for now.  -order ben gay for Left calf pain-- changed to scheduled TID 01/25/23 -Lidoderm patch to neck QD 4. Mood/Behavior/Sleep: LCSW to follow for evaluation and support.                       --Melatonin '6mg'$  QHS PRN, Trazodone prn for insomnia.        -antipsychotic agents: N/A 5. Neuropsych/cognition: This patient is capable of making decisions on her own behalf. 6. Skin/Wound Care: Routine pressure relief measures.   7. Fluids/Electrolytes/Nutrition: Monitor I/O. Monitor weekly labs mild hypoK= will supplement x 2 d and repeat 2/23--improved, monitor weekly labs next 02/03/23     Latest Ref Rng & Units 01/31/2023    7:22  AM 01/27/2023    6:18 AM 01/20/2023    8:38 AM  BMP  Glucose 70 - 99 mg/dL 110  117  111   BUN 8 - 23 mg/dL 16  14  12   Creatinine 0.44 - 1.00 mg/dL 0.81  0.88  0.86   Sodium 135 - 145 mmol/L 140  140  136   Potassium 3.5 - 5.1 mmol/L 3.8  3.4  3.4   Chloride 98 - 111 mmol/L 111  107  106   CO2 22 - 32 mmol/L '21  20  23   '$ Calcium 8.9 - 10.3 mg/dL 9.3  9.1  9.1      8. HTN: Monitor BP TID--continue to hold Valsartan and HCTZ.              --SBP trending 100- 140 range. Avoid hypoperfusion  -01/31/23 BPs creeping up may resume valsartan if trend continues next wk , cont to monitor -02/01/23 BP fairly stable, monitor Vitals:   01/28/23 1341 01/28/23 1930 01/29/23 0305 01/29/23 1549  BP: 136/76 132/72 (!) 140/68 130/69   01/29/23 1920 01/30/23 0507 01/30/23 1335 01/30/23 2001  BP: 136/82 (!) 141/89 136/89 (!) 150/77   01/31/23 0508 01/31/23 1237 01/31/23 1936 02/01/23 0525  BP: (!) 157/80 (!) 143/85 139/63 136/75      9. Stage IV  NSCLC w/lymphadenopathy/muscular nodules: Chemo to resume after discharge. --Per neurology, repeat MRI brain w/ and w/o in 6-8 weeks which would be ` last 2 weeks of March to continue to monitor for potential metastatic disease. Will need neuro outpt f/u 2-4 wks post discharge  10. GERD s/p esophageal dilatation: Continue Protonix '40mg'$  BID. Will add pepcid '20mg'$  BID also.  11. Anxiety/depression: On Paxil '40mg'$  daily w/xanax 0.'5mg'$  QHS prn.   12. Hyperlipidemia: On Crestor '20mg'$  QD  13. Neck pain/Headaches: On PRN Fioricet 2 tabs Q6H, using consistently.  Will add Topamax 25 mg bid.              --lidocaine patch for local measures.              -- Flexeril 5 mg TID PRN for muscle tightness in neck --increased topamax to '50mg'$  BID- reassess for dose  increase in am  -01/25/23 improving, cont regimen -01/29/23 increased topamax '75mg'$  BID  14. Horseness/L-VC paralysis : Has been ongoing since August-->a little worse.               -- SLP for swallow, speech               -- ISB   15. Dizziness/nausea/double vision: R-beating nystagmus on exam. Vestibular eval and treat.  -01/18/23 was getting Zofran inpatient, ordered '4mg'$  ODT q8h PRN n/v; also has Compazine PRN  16. Persistent cough since CVA. Causing cervicalgia, HA's, and nausea/vomiting with PO intakes.              --Treat cough -rule out aspiration/post obstructive infection.                -- Duo nebs qid added w/tessalon perles '100mg'$  TID -Improving , has lung ca so would expect some cough   17. Anemia: Hgb down to 10.5 on 01/10/23, stable at 10.7 on 01/20/23; monitor on weekly labs next [id number]  -Continue Folic acid '1mg'$  QD, multivitamins  18. Hx of rectal surgery, chronic constipation:  -01/18/23 LBM yesterday, but requests stool softeners given hx of rectal surgery. Ordered Miralax 17g QD + PRN, Colace '100mg'$  QD + PRN. Has Dulcolax supp PRN, Fleet's PRN.  -01/19/23 no BM in 2 days, doesn't like miralax; change miralax to PRN only, increase Colace to '100mg'$  BID+PRN, add Senna 8.'6mg'$  QD -Given sorbitol x 1 on 01/20/23; D/c'd Colace,  Senokot, and Miralax on 01/22/23, started dulcolax supp QD and Senokot-S 3 tabs BID on 01/22/23. -01/25/23 now with diarrhea; change Senokot-S to BID PRN, leave Dulcolax supp in place, monitor for now -2/20, BMx 2 on 2/19- change dulc supp to prn -02/01/23 no BM in 2 days, feels urge today, monitor for now  19. LLE pain: nursing reporting this after assessment this morning 01/18/23, per PT reports-- PT concerned for DVT, no significant swelling noted on exam this morning -01/18/23 ordered doppler U/S bilateral LEs to r/o DVT given hx of malignancy, continue above pain meds -01/20/23 + DVT , increase Eliquis to '10mg'$  BID x 7d then resume '5mg'$  BID- may cont therapy  -01/25/23 scheduled [place] as above -01/30/23 much better pain control, cont to monitor   20.  Vocal cord paralysis due to mediastinal adenopathy compressing recurrent laryngeal nerve  21. R ear pain: TMs with mild serous  effusion but no evidence of AOM, offered flonase but pt declined, monitor for now.    LOS: 15 days A FACE TO [place] 02/01/2023, 7:10 AM

## 2023-02-01 NOTE — Progress Notes (Signed)
Speech Language Pathology Daily Session Note  Patient Details  Name: Patricia [name] MRN: [id number] Date of Birth: [birth year]  Today's Date: 02/01/2023 SLP Individual Time: 1445-1535 SLP Individual Time Calculation (min): 50 min  Short Term Goals: Week 3: SLP Short Term Goal 1 (Week 3): STGs=LTGs due to ELOS  Skilled Therapeutic Interventions: Skilled ST treatment focused on cognitive and swallowing goals. Pt was greeted semi-reclined in bed on arrival and accompanied by spouse. Pt informed SLP that Provale cup has been lost (accidentally send away with meal tray) and pt has been drinking from regular cup and straw today without any coughing episodes per spouse. SLP assessed tolerance of thin liquids via cup and straw and w/ both single and sequential sips. Pt exhibited what appeared to be a swift swallow response and without overt s/sx of aspiration. Pt also agreeable to participate in PO trials with regular consistencies. Pt consumed with functional and timely mastication, adequate oral clearance, and delayed cough x1 suspect attributed to talking with bolus in mouth. Pt's spouse reported pt has been eating peanut butter crackers in room without noticeable difficulty or coughing episodes. Pt appears appropriate for diet advancement to regular textures and thin liquids. May discontinue use of Provale cup. SLP reinforced swallowing precautions and strategies including upright positioning, small bites/sips, slow rate, avoid talking while eating/drinking. Pt and spouse verbalized understanding via teach back.   SLP facilitated functional problem solving skills with "solving daily math problems" subtest via the ALFA. Pt read large print text with min A verbal cues for visual scanning L of midline. Pt achieved 2/10 accurate independent of cues and progressed to 7/10 with max A multimodal cues. SLP facilitated other time based scenarios (e.g., if it is 3:00 now, what time will it be in 1 hour?) with max A  verbal cues and benefiting from various different verbal explanations and use of finger counting. Pt demonstrated awareness to cognitive deficits with sup A verbal cues. Verbalized awareness that it would not be safe to drive. Pt sustained attention to task for 15 minute duration with sup A verbal redirection cues.   Patient was left in bed with alarm activated and immediate needs within reach at end of session. Continue per current plan of care.      Pain  None/denied  Therapy/Group: Individual Therapy  [name] 02/01/2023, 12:56 PM

## 2023-02-01 NOTE — Plan of Care (Signed)
  Problem: Consults Goal: RH STROKE PATIENT EDUCATION Description: See Patient Education module for education specifics  Outcome: Progressing   Problem: RH BOWEL ELIMINATION Goal: RH STG MANAGE BOWEL WITH ASSISTANCE Description: STG Manage Bowel with toileting Assistance. Outcome: Progressing Goal: RH STG MANAGE BOWEL W/MEDICATION W/ASSISTANCE Description: STG Manage Bowel with Medication with mod I Assistance. Outcome: Progressing   Problem: RH BLADDER ELIMINATION Goal: RH STG MANAGE BLADDER WITH ASSISTANCE Description: STG Manage Bladder With toileting Assistance Outcome: Progressing   Problem: RH PAIN MANAGEMENT Goal: RH STG PAIN MANAGED AT OR BELOW PT'S PAIN GOAL Description: < 4 with prns Outcome: Progressing   Problem: RH KNOWLEDGE DEFICIT Goal: RH STG INCREASE KNOWLEDGE OF HYPERTENSION Description: Patient and spouse will be able to manage HTN with medications and dietary modifications using educational resources indepdendently Outcome: Progressing Goal: RH STG INCREASE KNOWLEGDE OF HYPERLIPIDEMIA Description: Patient and spouse will be able to manage HLD with medications and dietary modifications using educational resources indepdendently Outcome: Progressing Goal: RH STG INCREASE KNOWLEDGE OF STROKE PROPHYLAXIS Description: Patient and spouse will be able to manage secondary risks with medications and dietary modifications using educational resources indepdendently Outcome: Progressing   Problem: Education: Goal: Knowledge of disease or condition will improve Outcome: Progressing Goal: Knowledge of secondary prevention will improve (MUST DOCUMENT ALL) Outcome: Progressing Goal: Knowledge of patient specific risk factors will improve Elta [place] N/A or DELETE if not current risk factor) Outcome: Progressing   Problem: Ischemic Stroke/TIA Tissue Perfusion: Goal: Complications of ischemic stroke/TIA will be minimized Outcome: Progressing   Problem: Coping: Goal: Will  verbalize positive feelings about self Outcome: Progressing Goal: Will identify appropriate support needs Outcome: Progressing   Problem: Health Behavior/Discharge Planning: Goal: Ability to manage health-related needs will improve Outcome: Progressing Goal: Goals will be collaboratively established with patient/family Outcome: Progressing   Problem: Self-Care: Goal: Ability to participate in self-care as condition permits will improve Outcome: Progressing Goal: Verbalization of feelings and concerns over difficulty with self-care will improve Outcome: Progressing Goal: Ability to communicate needs accurately will improve Outcome: Progressing   Problem: Nutrition: Goal: Risk of aspiration will decrease Outcome: Progressing Goal: Dietary intake will improve Outcome: Progressing

## 2023-02-01 NOTE — Progress Notes (Signed)
Occupational Therapy Session Note  Patient Details  Name: Patricia [name] MRN: [id number] Date of Birth: [birth year]  Today's Date: 02/01/2023 OT Individual Time: 1006-1100 OT Individual Time Calculation (min): 54 min    Short Term Goals: Week 1:  OT Short Term Goal 1 (Week 1): The pt will transfer to all surfaces with MinA at 95% safe OT Short Term Goal 1 - Progress (Week 1): Met OT Short Term Goal 2 (Week 1): The pt will complete LB dressing with MinA at 95% safe OT Short Term Goal 2 - Progress (Week 1): Met OT Short Term Goal 3 (Week 1): The pt will tolerate >45 minutes of activity with minimal rest breaks @ 95% safe OT Short Term Goal 3 - Progress (Week 1): Not met OT Short Term Goal 4 (Week 1): The pt will complete toileting with MinA by incorporating the grab bars  @ 95% safe OT Short Term Goal 4 - Progress (Week 1): Met Week 2:  OT Short Term Goal 1 (Week 2): STG=LTG d/t Pt ELOS OT Short Term Goal 1 - Progress (Week 2): Progressing toward goal  Skilled Therapeutic Interventions/Progress Updates:     Pt received sitting up in wc presenting to be in good spirits and receptive to skilled OT session. Pt reporting 0/10 pain and politely refusing need for shower/dressing this session. Pt requesting to use bathroom at beginning of session.   Pt sit>stand using RW CGA with mod questioning cues to recall hand placement and body positioning. Ambulated to toilet light min A using RW. Pt able to complete anterior peri care and clothing management with light min A to fully bring pants over bottom. Ambulated to sink and washed hands in standing using RW with dynamic standing challenge to lean outside BOS to turn on water/retrieve paper towels. Pt completed grooming/hygiene tasks in standing with supervision requiring min cueing to recall turning off water after brushing teeth. Pt able to maintain dynamic standing balance during tasks with CGA and mod cues for foot positioning and weight shifting to  COG.   Pt propelled wc to therapy gym with mod A to navigate around obstacles and max VB cueing for technique and attention to task.   Pt completed standing card matching task using RW for balance with posterior reaching incorporated into task. Pt completed cross body reaching x9 trials R/L to retreive playing cards from vertical mirror and place them behind her to simulate posterior reaching for toileting. Rest break provided following. Pt then retrieved cards in standing and visually scanned to match card on vertical mirror to address attention, problem solving, and awareness deficits. Pt completing task in moderately busy gym with Pt requiring max cueing to attend to task and locate matching card.   Pt requesting to use restroom at end of session d/t need for BM. Pt transported back to room in wc total A. Stand pivot to toilet using RW CGA and doffed clothing in standing with RW CGA. Pt handed off to nursing staff at end of session d/t requiring increased time on toilet.   Therapy Documentation Precautions:  Precautions Precautions: Fall, Other (comment) Precaution Comments: L hemiparesis, L lean Restrictions Weight Bearing Restrictions: No General:   Vital Signs: Therapy Vitals Temp: 97.8 F (36.6 C) Pulse Rate: 68 Resp: 17 BP: 136/75 Patient Position (if appropriate): Lying Oxygen Therapy SpO2: 96 % O2 Device: Room Air   Therapy/Group: Individual Therapy  Patricia [name] 02/01/2023, 7:59 AM

## 2023-02-02 DIAGNOSIS — K5901 Slow transit constipation: Secondary | ICD-10-CM

## 2023-02-02 MED ORDER — DOCUSATE SODIUM 100 MG PO CAPS
100.0000 mg | ORAL_CAPSULE | Freq: Two times a day (BID) | ORAL | Status: DC
  Administered 2023-02-02 – 2023-02-05 (×7): 100 mg via ORAL
  Filled 2023-02-02 (×7): qty 1

## 2023-02-02 MED ORDER — BISACODYL 10 MG RE SUPP
10.0000 mg | Freq: Every day | RECTAL | Status: DC
  Filled 2023-02-02: qty 1

## 2023-02-02 MED ORDER — HYDROCHLOROTHIAZIDE 25 MG PO TABS
25.0000 mg | ORAL_TABLET | Freq: Every day | ORAL | Status: DC
  Administered 2023-02-02 – 2023-02-05 (×4): 25 mg via ORAL
  Filled 2023-02-02 (×4): qty 1

## 2023-02-02 NOTE — Progress Notes (Signed)
Occupational Therapy Session Note  Patient Details  Name: Patricia [name] MRN: [id number] Date of Birth: 08/26/1946  Today's Date: 02/02/2023 OT Individual Time: 1345-1444 OT Individual Time Calculation (min): 59 min    Short Term Goals: Week 2:  OT Short Term Goal 1 (Week 2): STG=LTG d/t Pt ELOS OT Short Term Goal 1 - Progress (Week 2): Progressing toward goal  Skilled Therapeutic Interventions/Progress Updates:  Pt resting bed level upon OT arrival for pm session with husband bedside and pt reporting just having gotten back to bed from up all day in w/c due to migraine. Pt had rec'd meds from nursing just a few minutes ago. OT trial with clear sunglasses with same lens taping applied for light relief and diplopia mngt. Pt then agreeable to session with report of some increased comfort. Pt requested toilet use. OT assisted with close S from supine to sit to EOB, reviewed L wide head turn for compensation for L vis/perc deficit with SPT to L side with CGA. OT support w/c level due to urgency to toilet fearing loose stool.   CGA on and off regular toilet with grab bar support. Peri hygiene in sitting with R hand with CGA. Once back sink side completed hand washing with min cues for scanning for items on l side. Session completed with standing level activity for activity tolerance, visual perceptual skills and L sided NMRE. Pt able to complete large peg design (mod complexity) with CGA to stand for 3 min x 4 trials and 90% accuracy for target with graded control of L hand. Pt requested back to bed after standing activity and pt able to reposition in supine with HOB elevated with close S. Pt reported 3/10 HA at end of session. Left pt with bed exit engaged, husband bedside, needs and nurse call button in reach.  Therapy Documentation Precautions:  Precautions Precautions: Fall, Other (comment) Precaution Comments: L hemiparesis, L lean Restrictions Weight Bearing Restrictions:  No   Therapy/Group: Individual Therapy  Barnabas Lister 02/02/2023, 7:56 AM

## 2023-02-02 NOTE — Progress Notes (Signed)
PROGRESS NOTE   Subjective/Complaints:  Doing well today. Had some nausea this morning but hasn't asked for any zofran yet-- will after eval.  Slept well. Pain well controlled. Hasn't had a BM in several days, never had one yesterday, but feels like she needs to have one now. Tried and didn't have success. Urinating well. Ear feeling better.  Otherwise, denies additional complaints.   ROS: +R ear pain-resolved, +constipation, +nausea.  Denies fevers, chills, CP, SOB, abd pain, vomiting, diarrhea.   Objective:   No results found. No results for input(s): "WBC", "HGB", "HCT", "PLT" in the last 72 hours.   Recent Labs    01/31/23 0722  NA 140  K 3.8  CL 111  CO2 21*  GLUCOSE 110*  BUN 16  CREATININE 0.81  CALCIUM 9.3     Intake/Output Summary (Last 24 hours) at 02/02/2023 1134 Last data filed at 02/02/2023 0740 Gross per 24 hour  Intake 467 ml  Output --  Net 467 ml        Physical Exam: Vital Signs Blood pressure (!) 151/79, pulse 74, temperature 98 F (36.7 C), resp. rate 18, height 4' 9.99" (1.473 m), weight 60.4 kg, SpO2 96 %.   General: No acute distress, laying in bed Mood and affect are appropriate, happier this week HEENT: R ear with mild serous effusion, no TM erythma or significant bulging, L TM with scant serous effusion, also no TM erythema or bulging--not reassessed today Heart: Regular rate and rhythm no rubs murmurs or extra sounds Lungs: Clear to auscultation, breathing unlabored, no rales, rhonchi, or wheezes, occasional dry cough.  Abdomen: Positive bowel sounds, soft nontender to palpation, nondistended Extremities: No clubbing, cyanosis, or edema, left calf tenderness mild and seems improved this week, no swelling or erythema   Skin: No evidence of breakdown, no evidence of rash  Prior exam: MSK:      No apparent deformity.      Strength:               5/5 RUE and RLE, 5- LUE and LLE    Neurologic exam:  Cognition: AAO to person, place, time and event.  Voice:  +Hoarse, breathy vocal quality.   Mood: Pleasant affect, appropriate mood.  Sensation: To light touch intact in BL UEs and LEs    Spasticity: MAS 0 in all extremities.     Assessment/Plan: 1. Functional deficits which require 3+ hours per day of interdisciplinary therapy in a comprehensive inpatient rehab setting. Physiatrist is providing close team supervision and 24 hour management of active medical problems listed below. Physiatrist and rehab team continue to assess barriers to discharge/monitor patient progress toward functional and medical goals  Care Tool:  Bathing    Body parts bathed by patient: Right arm, Left arm, Chest, Abdomen, Front perineal area, Buttocks, Right upper leg, Left upper leg, Face   Body parts bathed by helper: Left lower leg, Right lower leg     Bathing assist Assist Level: Minimal Assistance - Patient > 75%     Upper Body Dressing/Undressing Upper body dressing   What is the patient wearing?: Pull over shirt    Upper body assist Assist Level: Set  up assist    Lower Body Dressing/Undressing Lower body dressing      What is the patient wearing?: Pants     Lower body assist Assist for lower body dressing: Moderate Assistance - Patient 50 - 74%     Toileting Toileting    Toileting assist Assist for toileting: Contact Guard/Touching assist     Transfers Chair/bed transfer  Transfers assist     Chair/bed transfer assist level: Minimal Assistance - Patient > 75% (stand pivot with HHA)     Locomotion Ambulation   Ambulation assist      Assist level: Moderate Assistance - Patient 50 - 74% Assistive device: Hand held assist Max distance: 175f   Walk 10 feet activity   Assist     Assist level: 2 helpers Assistive device: Hand held assist   Walk 50 feet activity   Assist Walk 50 feet with 2 turns activity did not occur: Safety/medical  concerns         Walk 150 feet activity   Assist Walk 150 feet activity did not occur: Safety/medical concerns         Walk 10 feet on uneven surface  activity   Assist Walk 10 feet on uneven surfaces activity did not occur: Safety/medical concerns         Wheelchair     Assist Is the patient using a wheelchair?: Yes Type of Wheelchair: Manual    Wheelchair assist level: Moderate Assistance - Patient 50 - 74% Max wheelchair distance: 150'    Wheelchair 50 feet with 2 turns activity    Assist        Assist Level: Moderate Assistance - Patient 50 - 74%   Wheelchair 150 feet activity     Assist      Assist Level: Moderate Assistance - Patient 50 - 74%   Blood pressure (!) 151/79, pulse 74, temperature 98 F (36.7 C), resp. rate 18, height 4' 9.99" (1.473 m), weight 60.4 kg, SpO2 96 %.  Medical Problem List and Plan: 1. 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 Antithrombotics: -DVT/anticoagulation:  Pharmaceutical: Eliquis 5 mg BID>>inc to '10mg'$  BID x 7d (01/20/23-01/27/23) then resume '5mg'$  BID starting 01/27/23 PM -antiplatelet therapy: N/A - was on ASA and Plavix prior to stroke, now on Eliquis only, will be on '10mg'$  BID x7d then resume '5mg'$ , Dr MJulien Nordmannin agreement   3. LBP/Pain Management: Will continue tylenol '650mg'$  q6h PRN for now.  -order ben gay for Left calf pain-- changed to scheduled TID 01/25/23 -Lidoderm patch to neck QD 4. Mood/Behavior/Sleep: LCSW to follow for evaluation and support.                       --Melatonin '6mg'$  QHS PRN, Trazodone prn for insomnia.        -antipsychotic agents: N/A 5. Neuropsych/cognition: This patient is capable of making decisions on her own behalf. 6. Skin/Wound Care: Routine pressure relief measures.   7.  Fluids/Electrolytes/Nutrition: Monitor I/O. Monitor weekly labs mild hypoK= will supplement x 2 d and repeat 2/23--improved, monitor weekly labs next 02/03/23     Latest Ref Rng & Units 01/31/2023    7:22 AM 01/27/2023    6:18 AM 01/20/2023  8:38 AM  BMP  Glucose 70 - 99 mg/dL 110  117  111   BUN 8 - 23 mg/dL '16  14  12   '$ Creatinine 0.44 - 1.00 mg/dL 0.81  0.88  0.86   Sodium 135 - 145 mmol/L 140  140  136   Potassium 3.5 - 5.1 mmol/L 3.8  3.4  3.4   Chloride 98 - 111 mmol/L 111  107  106   CO2 22 - 32 mmol/L '21  20  23   '$ Calcium 8.9 - 10.3 mg/dL 9.3  9.1  9.1      8. HTN: Monitor BP TID--continue to hold Valsartan-HCTZ 160-'25mg'$  QD.              --SBP trending 100- 140 range. Avoid hypoperfusion  -01/31/23 BPs creeping up may resume valsartan if trend continues next wk , cont to monitor -02/01/23 BP fairly stable, monitor -02/02/23 BP continuing to have spikes, will restart HCTZ '25mg'$  for now Vitals:   01/29/23 1549 01/29/23 1920 01/30/23 0507 01/30/23 1335  BP: 130/69 136/82 (!) 141/89 136/89   01/30/23 2001 01/31/23 0508 01/31/23 1237 01/31/23 1936  BP: (!) 150/77 (!) 157/80 (!) 143/85 139/63   02/01/23 0525 02/01/23 1259 02/01/23 1935 02/02/23 0545  BP: 136/75 127/77 (!) 150/83 (!) 151/79     9. Stage IV  NSCLC w/lymphadenopathy/muscular nodules: Chemo to resume after discharge. --Per neurology, repeat MRI brain w/ and w/o in 6-8 weeks which would be ` last 2 weeks of March to continue to monitor for potential metastatic disease. Will need neuro outpt f/u 2-4 wks post discharge  10. GERD s/p esophageal dilatation: Continue Protonix '40mg'$  BID. Will add pepcid '20mg'$  BID also.  11. Anxiety/depression: On Paxil '40mg'$  daily w/xanax 0.'5mg'$  QHS prn.   12. Hyperlipidemia: On Crestor '20mg'$  QD  13. Neck pain/Headaches: On PRN Fioricet 2 tabs Q6H, using consistently.  Will add Topamax 25 mg bid.              --lidocaine patch for local measures.              -- Flexeril 5 mg TID PRN for  muscle tightness in neck --increased topamax to '50mg'$  BID- reassess for dose  increase in am  -01/25/23 improving, cont regimen -01/29/23 increased topamax '75mg'$  BID  14. 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               -- SLP for swallow, speech              -- ISB   15. Dizziness/nausea/double vision: R-beating nystagmus on exam. Vestibular eval and treat.  -01/18/23 was getting Zofran inpatient, ordered '4mg'$  ODT q8h PRN n/v; also has Compazine PRN  16. Persistent cough since CVA. Causing cervicalgia, HA's, and nausea/vomiting with PO intakes.              --Treat cough -rule out aspiration/post obstructive infection.                -- Duo nebs qid added w/tessalon perles '100mg'$  TID -Improving , has lung ca so would expect some cough   17. Anemia: Hgb down to 10.5 on 01/10/23, stable at 10.7 on 01/20/23; monitor on weekly labs next [id number]  -Continue Folic acid '1mg'$  QD, multivitamins  18. Hx of rectal surgery, chronic constipation:  -01/18/23 LBM yesterday, but requests stool softeners given hx of rectal surgery. Ordered Miralax 17g QD + PRN,  Colace '100mg'$  QD + PRN. Has Dulcolax supp PRN, Fleet's PRN.  -01/19/23 no BM in 2 days, doesn't like miralax; change miralax to PRN only, increase Colace to '100mg'$  BID+PRN, add Senna 8.'6mg'$  QD -Given sorbitol x 1 on 01/20/23; D/c'd Colace, Senokot, and Miralax on 01/22/23, started dulcolax supp QD and Senokot-S 3 tabs BID on 01/22/23. -01/25/23 now with diarrhea; change Senokot-S to BID PRN, leave Dulcolax supp in place, monitor for now -2/20, BMx 2 on 2/19- change dulc supp to prn -02/01/23 no BM in 2 days, feels urge today, monitor for now -02/02/23 no BM still; restart dulcolax supp QD and colace '100mg'$  BID, leave Senokot-S 3tabs BID PRN; monitor  19. LLE pain: nursing reporting this after assessment this morning 01/18/23, per PT reports-- PT concerned for DVT,  no significant swelling noted on exam this morning -01/18/23 ordered doppler U/S bilateral LEs to r/o DVT given hx of malignancy, continue above pain meds -01/20/23 + DVT , increase Eliquis to '10mg'$  BID x 7d then resume '5mg'$  BID- may cont therapy  -01/25/23 scheduled Bengay as above -01/30/23 much better pain control, cont to monitor  20. R ear pain: TMs with mild serous effusion but no evidence of AOM, offered flonase but pt declined, monitor for now.   -02/02/23 improved, monitor   LOS: 16 days A FACE TO [place] 02/02/2023, 11:34 AM

## 2023-02-03 ENCOUNTER — Encounter (HOSPITAL_COMMUNITY): Payer: Self-pay | Admitting: Physical Medicine & Rehabilitation

## 2023-02-03 DIAGNOSIS — R058 Other specified cough: Secondary | ICD-10-CM | POA: Insufficient documentation

## 2023-02-03 DIAGNOSIS — I82403 Acute embolism and thrombosis of unspecified deep veins of lower extremity, bilateral: Secondary | ICD-10-CM | POA: Insufficient documentation

## 2023-02-03 LAB — CBC
HCT: 37 % (ref 36.0–46.0)
Hemoglobin: 12.3 g/dL (ref 12.0–15.0)
MCH: 29.4 pg (ref 26.0–34.0)
MCHC: 33.2 g/dL (ref 30.0–36.0)
MCV: 88.3 fL (ref 80.0–100.0)
Platelets: 281 10*3/uL (ref 150–400)
RBC: 4.19 MIL/uL (ref 3.87–5.11)
RDW: 16.1 % — ABNORMAL HIGH (ref 11.5–15.5)
WBC: 8.3 10*3/uL (ref 4.0–10.5)
nRBC: 0 % (ref 0.0–0.2)

## 2023-02-03 LAB — BASIC METABOLIC PANEL
Anion gap: 12 (ref 5–15)
BUN: 14 mg/dL (ref 8–23)
CO2: 21 mmol/L — ABNORMAL LOW (ref 22–32)
Calcium: 9.6 mg/dL (ref 8.9–10.3)
Chloride: 105 mmol/L (ref 98–111)
Creatinine, Ser: 0.9 mg/dL (ref 0.44–1.00)
GFR, Estimated: >60 mL/min (ref >60)
Glucose, Bld: 115 mg/dL — ABNORMAL HIGH (ref 70–99)
Potassium: 3.3 mmol/L — ABNORMAL LOW (ref 3.5–5.1)
Sodium: 138 mmol/L (ref 135–145)

## 2023-02-03 MED ORDER — TOPIRAMATE 25 MG PO TABS
100.0000 mg | ORAL_TABLET | Freq: Two times a day (BID) | ORAL | Status: DC
  Administered 2023-02-03 – 2023-02-05 (×4): 100 mg via ORAL
  Filled 2023-02-03 (×4): qty 4

## 2023-02-03 MED ORDER — SENNOSIDES-DOCUSATE SODIUM 8.6-50 MG PO TABS
3.0000 | ORAL_TABLET | Freq: Two times a day (BID) | ORAL | Status: DC
  Administered 2023-02-03 – 2023-02-05 (×3): 3 via ORAL
  Filled 2023-02-03 (×4): qty 3

## 2023-02-03 MED ORDER — BUTALBITAL-APAP-CAFFEINE 50-325-40 MG PO TABS
0.5000 | ORAL_TABLET | Freq: Four times a day (QID) | ORAL | Status: DC | PRN
  Administered 2023-02-03 – 2023-02-04 (×2): 0.5 via ORAL
  Filled 2023-02-03 (×2): qty 1

## 2023-02-03 MED ORDER — PHENAZOPYRIDINE HCL 100 MG PO TABS
100.0000 mg | ORAL_TABLET | Freq: Two times a day (BID) | ORAL | Status: AC
  Administered 2023-02-03 – 2023-02-04 (×4): 100 mg via ORAL
  Filled 2023-02-03 (×4): qty 1

## 2023-02-03 MED ORDER — POTASSIUM CHLORIDE CRYS ER 20 MEQ PO TBCR
20.0000 meq | EXTENDED_RELEASE_TABLET | Freq: Two times a day (BID) | ORAL | Status: DC
  Administered 2023-02-03 – 2023-02-05 (×4): 20 meq via ORAL
  Filled 2023-02-03 (×4): qty 1

## 2023-02-03 NOTE — Progress Notes (Signed)
Occupational Therapy Session Note  Patient Details  Name: Patricia [name] MRN: [id number] Date of Birth: May 07, 1946  Today's Date: 02/03/2023 OT Individual Time: 1330-1400 OT Individual Time Calculation (min): 30 min    Short Term Goals: Week 1:  OT Short Term Goal 1 (Week 1): The pt will transfer to all surfaces with MinA at 95% safe OT Short Term Goal 1 - Progress (Week 1): Met OT Short Term Goal 2 (Week 1): The pt will complete LB dressing with MinA at 95% safe OT Short Term Goal 2 - Progress (Week 1): Met OT Short Term Goal 3 (Week 1): The pt will tolerate >45 minutes of activity with minimal rest breaks @ 95% safe OT Short Term Goal 3 - Progress (Week 1): Not met OT Short Term Goal 4 (Week 1): The pt will complete toileting with MinA by incorporating the grab bars  @ 95% safe OT Short Term Goal 4 - Progress (Week 1): Met Week 2:  OT Short Term Goal 1 (Week 2): STG=LTG d/t Pt ELOS OT Short Term Goal 1 - Progress (Week 2): Progressing toward goal  Skilled Therapeutic Interventions/Progress Updates:    Pt received in bed with spouse present.  Pt agreeable to therapy.  Had pt learn how to get up from flat bed without rails with only guiding A.  Pt sat to EOB and worked on sit to stands and integrating in overhead reaches with max cues due to difficulty following directions.  Pt transferred to recliner and worked on sit to stands from [employer], standing balance with integrating simple arm exercises with max cues.  Focused on awareness of L side with pt using L side well with movements. Pt sat in recliner to rest with spouse in the room with her.    Therapy Documentation Precautions:  Precautions Precautions: Fall, Other (comment) Precaution Comments: L hemiparesis, L lean Restrictions Weight Bearing Restrictions: No   Pain: No c/o pain    Therapy/Group: Individual Therapy  [place] 02/03/2023, 11:48 AM

## 2023-02-03 NOTE — Progress Notes (Signed)
Patient ID: Patricia [name], female   DOB: 05-Mar-1946, 77 y.o.   MRN: [id number]  Patient and spouse prefer to use the [facility] that they have at home. No additional questions or concerns. Sw will FU with [employer].

## 2023-02-03 NOTE — Progress Notes (Signed)
PROGRESS NOTE   Subjective/Complaints:  No issues overnite   ROS: +R ear pain-resolved, +constipation, +nausea.  Denies fevers, chills, CP, SOB, abd pain, vomiting, diarrhea.   Objective:   No results found. No results for input(s): "WBC", "HGB", "HCT", "PLT" in the last 72 hours.   No results for input(s): "NA", "K", "CL", "CO2", "GLUCOSE", "BUN", "CREATININE", "CALCIUM" in the last 72 hours.    Intake/Output Summary (Last 24 hours) at 02/03/2023 0903 Last data filed at 02/02/2023 1823 Gross per 24 hour  Intake 600 ml  Output --  Net 600 ml         Physical Exam: Vital Signs Blood pressure 138/76, pulse 67, temperature 98.7 F (37.1 C), temperature source Oral, resp. rate 16, height 4' 9.99" (1.473 m), weight 60.4 kg, SpO2 94 %.   General: No acute distress, laying in bed Mood and affect are appropriate, happier this week HEENT: R ear with mild serous effusion, no TM erythma or significant bulging, L TM with scant serous effusion, also no TM erythema or bulging--not reassessed today Heart: Regular rate and rhythm no rubs murmurs or extra sounds Lungs: Clear to auscultation, breathing unlabored, no rales, rhonchi, or wheezes, occasional dry cough.  Abdomen: Positive bowel sounds, soft nontender to palpation, nondistended Extremities: No clubbing, cyanosis, or edema, left calf tenderness mild and seems improved this week, no swelling or erythema   Skin: No evidence of breakdown, no evidence of rash  Prior exam: MSK:      No apparent deformity.      Strength:               5/5 RUE and RLE, 5- LUE and LLE   Neurologic exam:  Cognition: AAO to person, place, time and event.  Voice:  +Hoarse, breathy vocal quality.   Mood: Pleasant affect, appropriate mood.  Sensation: To light touch intact in BL UEs and LEs    Spasticity: MAS 0 in all extremities.     Assessment/Plan: 1. Functional deficits which  require 3+ hours per day of interdisciplinary therapy in a comprehensive inpatient rehab setting. Physiatrist is providing close team supervision and 24 hour management of active medical problems listed below. Physiatrist and rehab team continue to assess barriers to discharge/monitor patient progress toward functional and medical goals  Care Tool:  Bathing    Body parts bathed by patient: Right arm, Left arm, Chest, Abdomen, Front perineal area, Buttocks, Right upper leg, Left upper leg, Face   Body parts bathed by helper: Left lower leg, Right lower leg     Bathing assist Assist Level: Minimal Assistance - Patient > 75%     Upper Body Dressing/Undressing Upper body dressing   What is the patient wearing?: Pull over shirt    Upper body assist Assist Level: Set up assist    Lower Body Dressing/Undressing Lower body dressing      What is the patient wearing?: Pants     Lower body assist Assist for lower body dressing: Moderate Assistance - Patient 50 - 74%     Toileting Toileting    Toileting assist Assist for toileting: Contact Guard/Touching assist     Transfers Chair/bed transfer  Transfers assist  Chair/bed transfer assist level: Minimal Assistance - Patient > 75% (stand pivot with HHA)     Locomotion Ambulation   Ambulation assist      Assist level: Moderate Assistance - Patient 50 - 74% Assistive device: Hand held assist Max distance: 173f   Walk 10 feet activity   Assist     Assist level: 2 helpers Assistive device: Hand held assist   Walk 50 feet activity   Assist Walk 50 feet with 2 turns activity did not occur: Safety/medical concerns         Walk 150 feet activity   Assist Walk 150 feet activity did not occur: Safety/medical concerns         Walk 10 feet on uneven surface  activity   Assist Walk 10 feet on uneven surfaces activity did not occur: Safety/medical concerns         Wheelchair     Assist Is  the patient using a wheelchair?: Yes Type of Wheelchair: Manual    Wheelchair assist level: Moderate Assistance - Patient 50 - 74% Max wheelchair distance: 150'    Wheelchair 50 feet with 2 turns activity    Assist        Assist Level: Moderate Assistance - Patient 50 - 74%   Wheelchair 150 feet activity     Assist      Assist Level: Moderate Assistance - Patient 50 - 74%   Blood pressure 138/76, pulse 67, temperature 98.7 F (37.1 C), temperature source Oral, resp. rate 16, height 4' 9.99" (1.473 m), weight 60.4 kg, SpO2 94 %.  Medical Problem List and Plan: 1. 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 Antithrombotics: -DVT/anticoagulation:  Pharmaceutical: Eliquis 5 mg BID>>inc to '10mg'$  BID x 7d (01/20/23-01/27/23) then resume '5mg'$  BID starting 01/27/23 PM -antiplatelet therapy: N/A - was on ASA and Plavix prior to stroke, now on Eliquis only, will be on '10mg'$  BID x7d then resume '5mg'$ , Dr MJulien Nordmannin agreement   3. LBP/Pain Management: Will continue tylenol '650mg'$  q6h PRN for now.  -order ben gay for Left calf pain-- changed to scheduled TID 01/25/23 -Lidoderm patch to neck QD 4. Mood/Behavior/Sleep: LCSW to follow for evaluation and support.                       --Melatonin '6mg'$  QHS PRN, Trazodone prn for insomnia.        -antipsychotic agents: N/A 5. Neuropsych/cognition: This patient is capable of making decisions on her own behalf. 6. Skin/Wound Care: Routine pressure relief measures.   7. Fluids/Electrolytes/Nutrition: Monitor I/O. Monitor weekly labs mild hypoK= will supplement x 2 d and repeat 2/23--improved, monitor weekly labs next 02/03/23     Latest Ref Rng & Units 01/31/2023    7:22 AM 01/27/2023    6:18 AM 01/20/2023    8:38 AM  BMP  Glucose 70 - 99 mg/dL 110  117  111    BUN 8 - 23 mg/dL '16  14  12   '$ Creatinine 0.44 - 1.00 mg/dL 0.81  0.88  0.86   Sodium 135 - 145 mmol/L 140  140  136   Potassium 3.5 - 5.1 mmol/L 3.8  3.4  3.4   Chloride 98 -  111 mmol/L 111  107  106   CO2 22 - 32 mmol/L '21  20  23   '$ Calcium 8.9 - 10.3 mg/dL 9.3  9.1  9.1      8. HTN: Monitor BP TID--continue to hold Valsartan-HCTZ 160-'25mg'$  QD.              --SBP trending 100- 140 range. Avoid hypoperfusion  -01/31/23 BPs creeping up may resume valsartan if trend continues next wk , cont to monitor -02/01/23 BP fairly stable, monitor -02/02/23 BP continuing to have spikes, will restart HCTZ '25mg'$  for now Vitals:   01/30/23 1335 01/30/23 2001 01/31/23 0508 01/31/23 1237  BP: 136/89 (!) 150/77 (!) 157/80 (!) 143/85   01/31/23 1936 02/01/23 0525 02/01/23 1259 02/01/23 1935  BP: 139/63 136/75 127/77 (!) 150/83   02/02/23 0545 02/02/23 1318 02/02/23 1953 02/03/23 0418  BP: (!) 151/79 (!) 143/84 (!) 150/79 138/76     9. Stage IV  NSCLC w/lymphadenopathy/muscular nodules: Chemo to resume after discharge. --Per neurology, repeat MRI brain w/ and w/o in 6-8 weeks which would be ` last 2 weeks of March to continue to monitor for potential metastatic disease. Will need neuro outpt f/u 2-4 wks post discharge  10. GERD s/p esophageal dilatation: Continue Protonix '40mg'$  BID. Will add pepcid '20mg'$  BID also.  11. Anxiety/depression: On Paxil '40mg'$  daily w/xanax 0.'5mg'$  QHS prn.   12. Hyperlipidemia: On Crestor '20mg'$  QD  13. Neck pain/Headaches: On PRN Fioricet 2 tabs Q6H, using consistently.  Will add Topamax 25 mg bid.              --lidocaine patch for local measures.              -- Flexeril 5 mg TID PRN for muscle tightness in neck --increased topamax to '50mg'$  BID- reassess for dose  increase in am  -01/25/23 improving, cont regimen -01/29/23 increased topamax '75mg'$  BID No HA c/o 2/26- taking 1-2 fioricet per day , reduce to 1/2 tab, should be able to get by with acetaminophen and topamax '100mg'$  BID   14. 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               -- SLP for swallow, speech              -- ISB   15. Dizziness/nausea/double vision: R-beating nystagmus on exam. Vestibular eval and treat.  -01/18/23 was getting Zofran inpatient, ordered '4mg'$  ODT q8h PRN n/v; also has Compazine PRN  16. Persistent cough since CVA. Causing cervicalgia, HA's, and nausea/vomiting with PO intakes.              --Treat cough -rule out aspiration/post obstructive infection.                -- Duo nebs qid added w/tessalon perles '100mg'$  TID -Improving , has lung ca so would expect some cough   17. Anemia: Hgb down to 10.5 on 01/10/23, stable at 10.7 on 01/20/23; monitor on weekly labs next [id number]  -Continue Folic acid '1mg'$  QD, multivitamins  18. Hx of rectal surgery, chronic constipation:  -01/18/23 LBM yesterday, but requests stool softeners given hx of rectal surgery. Ordered Miralax 17g QD + PRN, Colace '100mg'$  QD + PRN. Has Dulcolax supp PRN, Fleet's PRN.  -01/19/23 no BM in 2 days, doesn't like miralax; change miralax to PRN only, increase Colace to '100mg'$  BID+PRN, add Senna 8.'6mg'$  QD -Given sorbitol x 1 on 01/20/23;  D/c'd Colace, Senokot, and Miralax on 01/22/23, started dulcolax supp QD and Senokot-S 3 tabs BID on 01/22/23. -01/25/23 now with diarrhea; change Senokot-S to BID PRN, leave Dulcolax supp in place, monitor for now -2/20, BMx 2 on 2/19- change dulc supp to prn -02/01/23 no BM in 2 days, feels urge today, monitor for now -02/02/23 no BM still; restart dulcolax supp QD and colace '100mg'$  BID, leave Senokot-S 3tabs BID PRN; monitor LBM 2/24 will schedule senna S  19. LLE pain: nursing reporting this after assessment this morning 01/18/23, per PT reports-- PT concerned for DVT, no significant swelling noted on exam this morning -01/18/23 ordered doppler U/S bilateral LEs to r/o DVT given hx of malignancy,  continue above pain meds -01/20/23 + DVT , increase Eliquis to '10mg'$  BID x 7d then resume '5mg'$  BID- may cont therapy  -01/25/23 scheduled Bengay as above -01/30/23 much better pain control, cont to monitor  20. R ear pain: TMs with mild serous effusion but no evidence of AOM, offered flonase but pt declined, monitor for now.   -02/02/23 improved, monitor   LOS: 17 days A FACE TO FACE EVALUATION WAS PERFORMED  Charlett [name] 02/03/2023, 9:03 AM

## 2023-02-03 NOTE — Progress Notes (Signed)
Speech Language Pathology Daily Session Note  Patient Details  Name: Patricia [name] MRN: [id number] Date of Birth: Jul 02, 1946  Today's Date: 02/03/2023 SLP Individual Time: [id number] SLP Individual Time Calculation (min): 55 min  Short Term Goals: Week 3: SLP Short Term Goal 1 (Week 3): STGs=LTGs due to ELOS  Skilled Therapeutic Interventions: Skilled treatment session focused on dysphagia and cognitive goals. Upon arrival, patient was awake in bed while eating her breakfast. Patient consumed her meal of regular textures with thin liquids via cup without overt s/s of aspiration and supervision level verbal cues for use of swallowing compensatory strategies. Recommend patient continue current diet. Patient requested to use the bathroom with Min-Mod verbal cues needed for sequencing and problem solving throughout task. Patient was continent of bladder and performed basic self-care tasks at the sink with extra time. SLP also facilitated session by providing Mod verbal cues for external aids for orientation to date as well as for verbalizing the steps/sequence of a transfer in order for SLP to write them down to maximize recall and carryover of information. Patient left upright in bed with alarm on and all needs within reach. Continue with current plan of care.      Pain No/Denies Pain   Therapy/Group: Individual Therapy  Aviendha Azbell 02/03/2023, 7:54 AM

## 2023-02-03 NOTE — Progress Notes (Signed)
Occupational Therapy Session Note  Patient Details  Name: NYVEAH JUNGMANN MRN: [id number] Date of Birth: 12/05/1946  Today's Date: 02/03/2023 OT Individual Time: [id number] OT Individual Time Calculation (min): 57 min    Short Term Goals: Week 2:  OT Short Term Goal 1 (Week 2): STG=LTG d/t Pt ELOS OT Short Term Goal 1 - Progress (Week 2): Progressing toward goal  Skilled Therapeutic Interventions/Progress Updates:     Pt received reclined in recliner with husband present in room. Pt presenting to be in good spirits, however fatigued following full day of therapy sessions. Pt receptive to skilled OT session with increased rest breaks provided to increase Pt safety 2/2 fatigue.   Pt requesting to use restroom at beginning of session. Pt impulsively trying to stand Independently at beginning of session requiring mod VB cueing to wait for therapist and RW set-up. Pt sit>stand CGA with mod questioning cues required for hand placement and body mechanics. Ambulated ot bathroom using RW CGA with max tactile and VB cueing required for step length of L foot with Pt initially dragging foot during ambulation. Transferred to toilet using grab bars and RW completing 3/3 toileting task with light min A for balance d/t posterior lean and decreased attention. Pt ambulated to sink and washed her hands in standing for dynamic standing balance challenge with CGA with VB cueing required for foot position. Pt required mod questioning cues to turn off water following task.   Pt transported to therapy gym total A in wc for time management and energy conservation. Pt completed standing star stepping task with R and then LLE. Pt impulsive during task requiring max VB cues for pacing and safety as Pt frequently not stepping L foot back to start position and attempting to step with R foot without weight shifting. After learning task, Pt able to complete front and side star steps using RW with light min A for balance +VB cues  for pacing and tactile cues for weight shifting and R/L discrimination.   Task then graded up to incorporate forward stepping with single arm support on RW with horse shoes throwing activity. Sit>stand practice incorporated into task to increase LB strength, balance, and body awareness for ADLs and functional mobility. Pt able to complete sit>stand x6 trials using RW with CGA and mod cueing for technique. Forward stepping with R foot d/t R side dominant returning to center following throw with RUE light min A with weight shifting facilitation. Pt reporting fatigue and provided rest break.   Pt stand step to wc using RW CGA. Pt transported back to room total A in wc d/t fatigue. Pt ambulated to EOB using RW CGA transitioning to supine supervision. Pt left resting in bed with call bell in reach, bed alarm on, and all needs met.   Therapy Documentation Precautions:  Precautions Precautions: Fall, Other (comment) Precaution Comments: L hemiparesis, L lean Restrictions Weight Bearing Restrictions: No General:   Vital Signs: Therapy Vitals Temp: 98.3 F (36.8 C) Temp Source: Oral Pulse Rate: 78 Resp: 19 BP: 117/78 Patient Position (if appropriate): Lying Oxygen Therapy SpO2: 95 % O2 Device: Room Air Pain:   ADL: ADL Equipment Provided: Reacher Eating: Not assessed (based on observation pt is s/u assist does present with swallowing challenges encouraged to tuck chin to reduce incidence for adverse response.) Where Assessed-Eating: Wheelchair Grooming: Setup Where Assessed-Grooming: Sitting at sink Upper Body Bathing: Setup Where Assessed-Upper Body Bathing: Sitting at sink Lower Body Bathing: Minimal assistance, Moderate assistance Where Assessed-Lower Body Bathing: Sitting  at sink Upper Body Dressing: Setup Where Assessed-Upper Body Dressing: Wheelchair Lower Body Dressing: Minimal assistance, Moderate assistance Where Assessed-Lower Body Dressing: Wheelchair Toileting: Modified  independent, Contact guard (secondary to challenges with transfer from w/c to commode needing additional time) Where Assessed-Toileting: Glass blower/designer: Moderate assistance, Minimal assistance (using grab bar and additional time.) Toilet Transfer Method: Stand pivot Toilet Transfer Equipment: Grab bars (grab bar and arm of w/c) Tub/Shower Transfer: Minimal assistance, Moderate assistance (based on observation of toilet transfer.) Social research officer, government: Not assessed Vision   Perception    Praxis   Balance   Exercises:   Other Treatments:     Therapy/Group: Individual Therapy  [name] 02/03/2023, 3:18 PM

## 2023-02-03 NOTE — Progress Notes (Signed)
Physical Therapy Session Note  Patient Details  Name: Patricia [name] MRN: [id number] Date of Birth: 1946-08-21  Today's Date: 02/03/2023 PT Individual Time: 1000-1053 PT Individual Time Calculation (min): 53 min   Short Term Goals: Week 2:  PT Short Term Goal 1 (Week 2): = to LTGs based on ELOS PT Short Term Goal 1 - Progress (Week 2): Progressing toward goal Week 3:  PT Short Term Goal 1 (Week 3): = to LTGs based on ELOS  Skilled Therapeutic Interventions/Progress Updates:   Received pt semi-reclined in bed, pt agreeable to PT treatment, and reported headache - RN notified but reported pt up to date on pain medication. Session with emphasis on functional mobility/transfers, generalized strengthening and endurance, dynamic standing balance/coordination, stair navigation, gait training, and toileting. Pt transferred semi-reclined<>sitting EOB with HOB elevated and use of bedrails with supervision. Donned socks and shoes with max A and transferred bed<>WC stand<>pivot with RW and min A (noted posterior lean) and transported to/from room in [facility] dependently for time management purposes. Pt stood with RW and min A and ambulated 136f with RW and min A - emphasis on turns. Pt with tendency to run into obstacles on R side and required cues for proximity to RW, to increase L step length, widen BOS, and for RW safety with ambulation. Pt also required cues to reach back for [facility] Hsptlarmrests prior to sitting down.   Pt then navigated 4 6in steps x 2 trials with bilateral handrails and min A ascending and descending with a step to pattern - cues to ensure L foot entirely on step prior to proceeding. Pt limited by dizziness and required rest break in between trials. Pt stood with RW and min A and ambulated additional 1847fwith RW and CGA fading to min A with fatigue and with turns. Returned to room and pt reported urge to toilet. Stood with RW and min A and ambulated into bathroom with RW and min A with cues for RW  safety when turning to sit. Pt able to remove clothing with CGA and left seated on toilet left in care of AaMarjory Liescleared to assist with toileting per safety plan).   Therapy Documentation Precautions:  Precautions Precautions: Fall, Other (comment) Precaution Comments: L hemiparesis, L lean Restrictions Weight Bearing Restrictions: No  Therapy/Group: Individual Therapy AnAlfonse AlpersT, DPT  02/03/2023, 7:12 AM

## 2023-02-04 MED ORDER — POTASSIUM CHLORIDE CRYS ER 20 MEQ PO TBCR
20.0000 meq | EXTENDED_RELEASE_TABLET | Freq: Two times a day (BID) | ORAL | 0 refills | Status: DC
  Filled 2023-02-04: qty 60, 30d supply, fill #0

## 2023-02-04 MED ORDER — LIDOCAINE 5 % EX PTCH
1.0000 | MEDICATED_PATCH | CUTANEOUS | 0 refills | Status: DC
  Filled 2023-02-04: qty 30, 30d supply, fill #0

## 2023-02-04 MED ORDER — HYDROCHLOROTHIAZIDE 25 MG PO TABS
25.0000 mg | ORAL_TABLET | Freq: Every day | ORAL | 0 refills | Status: DC
  Filled 2023-02-04: qty 30, 30d supply, fill #0

## 2023-02-04 MED ORDER — DOCUSATE SODIUM 100 MG PO CAPS
100.0000 mg | ORAL_CAPSULE | Freq: Two times a day (BID) | ORAL | 0 refills | Status: DC
  Filled 2023-02-04: qty 60, 30d supply, fill #0

## 2023-02-04 MED ORDER — BENZONATATE 100 MG PO CAPS
100.0000 mg | ORAL_CAPSULE | Freq: Three times a day (TID) | ORAL | 0 refills | Status: DC
  Filled 2023-02-04: qty 90, 30d supply, fill #0

## 2023-02-04 MED ORDER — LANSOPRAZOLE 30 MG PO CPDR
DELAYED_RELEASE_CAPSULE | ORAL | 1 refills | Status: DC
  Filled 2023-02-04: qty 90, fill #0

## 2023-02-04 MED ORDER — ROSUVASTATIN CALCIUM 20 MG PO TABS
20.0000 mg | ORAL_TABLET | Freq: Every day | ORAL | 0 refills | Status: DC
  Filled 2023-02-04: qty 30, 30d supply, fill #0

## 2023-02-04 MED ORDER — MUSCLE RUB 10-15 % EX CREA
1.0000 | TOPICAL_CREAM | Freq: Three times a day (TID) | CUTANEOUS | 0 refills | Status: DC
  Filled 2023-02-04: qty 85, 29d supply, fill #0

## 2023-02-04 MED ORDER — PROCHLORPERAZINE MALEATE 10 MG PO TABS
10.0000 mg | ORAL_TABLET | Freq: Four times a day (QID) | ORAL | 0 refills | Status: DC | PRN
  Filled 2023-02-04: qty 30, 8d supply, fill #0

## 2023-02-04 MED ORDER — FOLIC ACID 1 MG PO TABS
1.0000 mg | ORAL_TABLET | Freq: Every day | ORAL | 0 refills | Status: DC
  Filled 2023-02-04: qty 30, 30d supply, fill #0

## 2023-02-04 MED ORDER — APIXABAN 5 MG PO TABS
5.0000 mg | ORAL_TABLET | Freq: Two times a day (BID) | ORAL | 0 refills | Status: DC
  Filled 2023-02-04: qty 60, 30d supply, fill #0

## 2023-02-04 MED ORDER — TOPIRAMATE 100 MG PO TABS
100.0000 mg | ORAL_TABLET | Freq: Two times a day (BID) | ORAL | 0 refills | Status: DC
  Filled 2023-02-04: qty 60, 30d supply, fill #0

## 2023-02-04 MED ORDER — CYCLOBENZAPRINE HCL 5 MG PO TABS
5.0000 mg | ORAL_TABLET | Freq: Three times a day (TID) | ORAL | 0 refills | Status: DC | PRN
  Filled 2023-02-04: qty 30, 10d supply, fill #0

## 2023-02-04 MED ORDER — FAMOTIDINE 20 MG PO TABS
20.0000 mg | ORAL_TABLET | Freq: Two times a day (BID) | ORAL | 0 refills | Status: DC
  Filled 2023-02-04: qty 60, 30d supply, fill #0

## 2023-02-04 MED ORDER — BUTALBITAL-APAP-CAFFEINE 50-325-40 MG PO TABS
0.5000 | ORAL_TABLET | Freq: Four times a day (QID) | ORAL | 0 refills | Status: DC | PRN
  Filled 2023-02-04: qty 14, 7d supply, fill #0

## 2023-02-04 MED ORDER — SENNOSIDES-DOCUSATE SODIUM 8.6-50 MG PO TABS
3.0000 | ORAL_TABLET | Freq: Two times a day (BID) | ORAL | 0 refills | Status: DC
  Filled 2023-02-04: qty 180, 30d supply, fill #0

## 2023-02-04 NOTE — Progress Notes (Signed)
Inpatient Rehabilitation Discharge Medication Review by a Pharmacist  A complete drug regimen review was completed for this patient to identify any potential clinically significant medication issues.  High Risk Drug Classes Is patient taking? Indication by Medication  Antipsychotic Yes Compazine- N/V  Anticoagulant Yes Apixaban- DVT  Antibiotic No   Opioid No   Antiplatelet No   Hypoglycemics/insulin No   Vasoactive Medication Yes HCTZ- HTN  Chemotherapy No   Other Yes Pepcid- GERD Prevacid- GERD Paxil- MDD Crestor- HLD Topamax- HA ppx Xanax- anxiety Fioricet- HA Flexeril- muscle spasms     Type of Medication Issue Identified Description of Issue Recommendation(s)  Drug Interaction(s) (clinically significant)     Duplicate Therapy     Allergy     No Medication Administration End Date     Incorrect Dose     Additional Drug Therapy Needed     Significant med changes from prior encounter (inform family/care partners about these prior to discharge).    Other       Clinically significant medication issues were identified that warrant physician communication and completion of prescribed/recommended actions by midnight of the next day:  No   Time spent performing this drug regimen review (minutes):  30   Suzi Hernan BS, PharmD, BCPS Clinical Pharmacist 02/05/2023 7:28 AM  Contact: [phone] after 3 PM  "Be curious, not judgmental..." -Debbora Dus

## 2023-02-04 NOTE — Progress Notes (Signed)
Occupational Therapy Session Note  Patient Details  Name: Patricia [name] MRN: [id number] Date of Birth: 05/27/1946  Today's Date: 02/04/2023 OT Individual Time: 1337-1400 OT Individual Time Calculation (min): 23 min  37 mins missed d/t pain/fatigue  Short Term Goals: Week 3:  STG= LTGs   Skilled Therapeutic Interventions/Progress Updates:  Pt greeted supine in bed reporting pain and fatigue, pt agreeable limited OT intervention. Session focus on BADL reeducation, functional mobility, dynamic standing balance and decreasing overall caregiver burden.     Pt requested assisted to put her hair into a pony tail as pt with difficulty scooping up L side of her hair. Pt completed supine>sit with supervision and completed an ambulatory toilet transfer with RW and CGA. Pt completed 3/3 toileting tasks CGA with pt completing continent urine void.   Pt stood at sink for hand hygiene with CGA. Pt continues to report pain from HA and in her neck requesting to return to bed, issued pt heat pack for neck.                   Ended session with pt supine in bed with all needs within reach and bed alarm activated.                     Therapy Documentation Precautions:  Precautions Precautions: Fall, Other (comment) Precaution Comments: L hemiparesis, posterior LOB bias with occasional L lean Restrictions Weight Bearing Restrictions: No General: General OT Amount of Missed Time: 37 Minutes   Pain: unrated pain in head and neck, heat provided and rest breaks provided as needed.    Therapy/Group: Individual Therapy  [name] 02/04/2023, 3:39 PM

## 2023-02-04 NOTE — Progress Notes (Signed)
PROGRESS NOTE   Subjective/Complaints:  No issues overnite , has been refusing dulc supp qd except had one last noc with results DIscussed K+ and need for f/u lab  ROS:+constipation, -nausea.  Denies fevers, chills, CP, SOB, abd pain, vomiting, diarrhea.   Objective:   No results found. Recent Labs    02/03/23 0945  WBC 8.3  HGB 12.3  HCT 37.0  PLT 281     Recent Labs    02/03/23 0945  NA 138  K 3.3*  CL 105  CO2 21*  GLUCOSE 115*  BUN 14  CREATININE 0.90  CALCIUM 9.6      Intake/Output Summary (Last 24 hours) at 02/04/2023 0816 Last data filed at 02/04/2023 0735 Gross per 24 hour  Intake 458 ml  Output --  Net 458 ml         Physical Exam: Vital Signs Blood pressure (!) 146/82, pulse 91, temperature 98.1 F (36.7 C), temperature source Oral, resp. rate 16, height 4' 9.99" (1.473 m), weight 60.4 kg, SpO2 92 %.   General: No acute distress, laying in bed Mood and affect are appropriate, happier this week HEENT: R ear with mild serous effusion, no TM erythma or significant bulging, L TM with scant serous effusion, also no TM erythema or bulging--not reassessed today Heart: Regular rate and rhythm no rubs murmurs or extra sounds Lungs: Clear to auscultation, breathing unlabored, no rales, rhonchi, or wheezes, occasional dry cough.  Abdomen: Positive bowel sounds, soft nontender to palpation, nondistended Extremities: No clubbing, cyanosis, or edema, left calf tenderness mild and seems improved this week, no swelling or erythema   Skin: No evidence of breakdown, no evidence of rash  Prior exam: MSK:      No apparent deformity.      Strength:               5/5 RUE and RLE, 5- LUE and LLE   Neurologic exam:  Cognition: AAO to person, place, time and event.  Voice:  +Hoarse, breathy vocal quality.   Mood: Pleasant affect, appropriate mood.  Sensation: To light touch intact in BL UEs and LEs     Spasticity: MAS 0 in all extremities.     Assessment/Plan: 1. Functional deficits which require 3+ hours per day of interdisciplinary therapy in a comprehensive inpatient rehab setting. Physiatrist is providing close team supervision and 24 hour management of active medical problems listed below. Physiatrist and rehab team continue to assess barriers to discharge/monitor patient progress toward functional and medical goals  Care Tool:  Bathing    Body parts bathed by patient: Right arm, Left arm, Chest, Abdomen, Front perineal area, Buttocks, Right upper leg, Left upper leg, Face   Body parts bathed by helper: Left lower leg, Right lower leg     Bathing assist Assist Level: Minimal Assistance - Patient > 75%     Upper Body Dressing/Undressing Upper body dressing   What is the patient wearing?: Pull over shirt    Upper body assist Assist Level: Set up assist    Lower Body Dressing/Undressing Lower body dressing      What is the patient wearing?: Pants     Lower  body assist Assist for lower body dressing: Moderate Assistance - Patient 50 - 74%     Toileting Toileting    Toileting assist Assist for toileting: Contact Guard/Touching assist     Transfers Chair/bed transfer  Transfers assist     Chair/bed transfer assist level: Minimal Assistance - Patient > 75% Chair/bed transfer assistive device: Programmer, multimedia   Ambulation assist      Assist level: Minimal Assistance - Patient > 75% Assistive device: Walker-rolling Max distance: 130f   Walk 10 feet activity   Assist     Assist level: Minimal Assistance - Patient > 75% Assistive device: Walker-rolling   Walk 50 feet activity   Assist Walk 50 feet with 2 turns activity did not occur: Safety/medical concerns  Assist level: Minimal Assistance - Patient > 75% Assistive device: Walker-rolling    Walk 150 feet activity   Assist Walk 150 feet activity did not occur:  Safety/medical concerns  Assist level: Minimal Assistance - Patient > 75% Assistive device: Walker-rolling    Walk 10 feet on uneven surface  activity   Assist Walk 10 feet on uneven surfaces activity did not occur: Safety/medical concerns         Wheelchair     Assist Is the patient using a wheelchair?: Yes Type of Wheelchair: Manual    Wheelchair assist level: Moderate Assistance - Patient 50 - 74% Max wheelchair distance: 150'    Wheelchair 50 feet with 2 turns activity    Assist        Assist Level: Moderate Assistance - Patient 50 - 74%   Wheelchair 150 feet activity     Assist      Assist Level: Moderate Assistance - Patient 50 - 74%   Blood pressure (!) 146/82, pulse 91, temperature 98.1 F (36.7 C), temperature source Oral, resp. rate 16, height 4' 9.99" (1.473 m), weight 60.4 kg, SpO2 92 %.  Medical Problem List and Plan: 1. 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 Antithrombotics: -DVT/anticoagulation:  Pharmaceutical: Eliquis 5 mg BID>>inc to '10mg'$  BID x 7d (01/20/23-01/27/23) then resume '5mg'$  BID starting 01/27/23 PM -antiplatelet therapy: N/A - was on ASA and Plavix prior to stroke, now on Eliquis only, will be on '10mg'$  BID [id number] resume '5mg'$ , Dr MJulien Nordmannin agreement   3. LBP/Pain Management: Will continue tylenol '650mg'$  q6h PRN for now.  -order ben gay for Left calf pain-- changed to scheduled TID 01/25/23 -Lidoderm patch to neck QD 4. Mood/Behavior/Sleep: LCSW to follow for evaluation and support.                       --Melatonin '6mg'$  QHS PRN, Trazodone prn for insomnia.        -antipsychotic agents: N/A 5. Neuropsych/cognition: This patient is capable of making decisions on her own behalf. 6. Skin/Wound Care: Routine pressure relief measures.   7.  Fluids/Electrolytes/Nutrition: Monitor I/O. Monitor weekly labs mild hypoK , now on KCL 231m BID, recheck in am , will need daily KCL supplementation now that HCTZ restarted      Latest Ref Rng & Units 02/03/2023    9:45 AM 01/31/2023    7:22 AM 01/27/2023    6:18 AM  BMP  Glucose 70 - 99 mg/dL 115  110  117   BUN 8 - 23 mg/dL '14  16  14   '$ Creatinine 0.44 - 1.00 mg/dL 0.90  0.81  0.88   Sodium 135 - 145 mmol/L 138  140  140   Potassium 3.5 - 5.1 mmol/L 3.3  3.8  3.4   Chloride 98 - 111 mmol/L 105  111  107   CO2 22 - 32 mmol/L '21  21  20   '$ Calcium 8.9 - 10.3 mg/dL 9.6  9.3  9.1      8. HTN: Monitor BP TID--continue to hold Valsartan-HCTZ 160-'25mg'$  QD.              --SBP trending 100- 140 range. Avoid hypoperfusion  -01/31/23 BPs creeping up may resume valsartan if trend continues next wk , cont to monitor -02/01/23 BP fairly stable, monitor -02/02/23 BP continuing to have spikes, will restart HCTZ '25mg'$  for now Vitals:   01/31/23 1237 01/31/23 1936 02/01/23 0525 02/01/23 1259  BP: (!) 143/85 139/63 136/75 127/77   02/01/23 1935 02/02/23 0545 02/02/23 1318 02/02/23 1953  BP: (!) 150/83 (!) 151/79 (!) 143/84 (!) 150/79   02/03/23 0418 02/03/23 1309 02/03/23 1955 02/04/23 0335  BP: 138/76 117/78 (!) 143/73 (!) 146/82     9. Stage IV  NSCLC w/lymphadenopathy/muscular nodules: Chemo to resume after discharge. --Per neurology, repeat MRI brain w/ and w/o in 6-8 weeks which would be ` last 2 weeks of March to continue to monitor for potential metastatic disease. Will need neuro outpt f/u 2-4 wks post discharge  10. GERD s/p esophageal dilatation: Continue Protonix '40mg'$  BID. Will add pepcid '20mg'$  BID also.  11. Anxiety/depression: On Paxil '40mg'$  daily w/xanax 0.'5mg'$  QHS prn.   12. Hyperlipidemia: On Crestor '20mg'$  QD  13. Neck pain/Headaches: On PRN Fioricet 2 tabs Q6H, using consistently.  Will add Topamax 25 mg bid.              --lidocaine patch for local measures.              -- Flexeril  5 mg TID PRN for muscle tightness in neck --increased topamax to '50mg'$  BID- reassess for dose  increase in am  -01/25/23 improving, cont regimen -01/29/23 increased topamax '75mg'$  BID No HA c/o 2/26- taking 1-2 fioricet per day , reduce to 1/2 tab, should be able to get by with acetaminophen and topamax '100mg'$  BID  Only took 1/2 fioricet yesterday will d/c  14. 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               -- SLP for swallow, speech              -- ISB   15. Dizziness/nausea/double vision: R-beating nystagmus on exam. Vestibular eval and treat.  -01/18/23 was getting Zofran inpatient, ordered '4mg'$  ODT q8h PRN n/v; also has Compazine PRN  16. Persistent cough since CVA. Causing cervicalgia, HA's, and nausea/vomiting with PO intakes.              --Treat cough -rule out aspiration/post obstructive infection.                -- Duo nebs qid added w/tessalon perles '100mg'$  TID -Improving , has lung ca so would expect some cough   17. Anemia: Hgb down to 10.5 on 01/10/23, stable at 10.7 on 01/20/23; monitor on weekly labs next 02/03/23  -Continue  Folic acid '1mg'$  QD, multivitamins  18. Hx of rectal surgery, chronic constipation:  -01/18/23 LBM yesterday, but requests stool softeners given hx of rectal surgery. Ordered Miralax 17g QD + PRN, Colace '100mg'$  QD + PRN. Has Dulcolax supp PRN, Fleet's PRN.  -01/19/23 no BM in 2 days, doesn't like miralax; change miralax to PRN only, increase Colace to '100mg'$  BID+PRN, add Senna 8.'6mg'$  QD -Given sorbitol x 1 on 01/20/23; D/c'd Colace, Senokot, and Miralax on 01/22/23, started dulcolax supp QD and Senokot-S 3 tabs BID on 01/22/23. -01/25/23 now with diarrhea; change Senokot-S to BID PRN, leave Dulcolax supp in place, monitor for now -2/20, BMx 2 on 2/19- change dulc supp to prn -02/01/23 no BM in 2 days, feels urge today, monitor for now Constipation had BM after supp  yesterday , cont colace '100mg'$  BID LBM 2/24 will schedule senna S  19. LLE pain: nursing reporting this after assessment this morning 01/18/23, per PT reports-- PT concerned for DVT, no significant swelling noted on exam this morning -01/18/23 ordered doppler U/S bilateral LEs to r/o DVT given hx of malignancy, continue above pain meds -01/20/23 + DVT , increase Eliquis to '10mg'$  BID x 7d then resume '5mg'$  BID- may cont therapy  -01/25/23 scheduled [place] as above -01/30/23 much better pain control, cont to monitor  20. R ear pain: TMs with mild serous effusion but no evidence of AOM, offered flonase but pt declined, monitor for now.   -02/02/23 improved, monitor   LOS: 18 days A FACE TO FACE EVALUATION WAS PERFORMED  Charlett [name] 02/04/2023, 8:16 AM

## 2023-02-04 NOTE — Progress Notes (Signed)
Occupational Therapy Discharge Summary  Patient Details  Name: Patricia [name] MRN: [id number] Date of Birth: [birth year]  Date of Discharge from OT service:February 04, 2023  Today's Date: 02/04/2023 OT Individual Time: [id number] OT Individual Time Calculation (min): 72 min    Patient has met 4 of 5 long term goals due to improved activity tolerance, improved balance, postural control, ability to compensate for deficits, improved attention, improved awareness, and improved coordination.  Patient to discharge at overall Supervision-CGA level.  Patient's care partner is independent to provide the necessary physical and cognitive assistance at discharge.    Reasons goals not met: Bathing goal not met d/t Pt requiring supervision for bathing tasks 2/2 cognitive deficits.   Recommendation:  Patient will benefit from ongoing skilled OT services in home health setting to continue to advance functional skills in the area of BADL, iADL, Vocation, and Reduce care partner burden.  Equipment: BSC (family owns a hospital bed and shower chair with back)  Reasons for discharge: treatment goals met and discharge from hospital  Patient/family agrees with progress made and goals achieved: Yes  OT Discharge Skilled Therapeutic Intervention/Progress:  Pt received sitting up in bed presenting to be in good spirits and receptive to skilled OT session with emphasis on BADL retraining and functional transfers in preparation for d/c. Provided Pt print out of home HEP and reviewed exercises with her to support carry over upon d/c. Informed Pt she would most likely need assistance from husband to follow pictures with Pt receptive. Rn in/out for medication management. MD in/out for morning rounds. Pt transitioned to EOB with supervision. Sit>stand using RW CGA with VB cueing required for hand placement. Pt ambulated to bathroom and transferred to toilet using RW with CGA. Pt doffed clothing with supervision seated  on toilet. Pt transferred to tub bench in walk-in shower with CGA and mod cueing for technique. Pt able to complete Ub bathing supervision and LB bathing CGA using long-handled sponge to wash feet with education provided. Reviewed shower safety with Pt with pt receptive. Pt ambulated to room using RW and completed UB dressing Eob supervision. LB dressing completed with CGA for dynamic sitting and standing balance. Pt completed grooming/hygiene tasks seated at sink with supervision. WC>EOB using RW CGA. EOB>supine supervision using bed features. Assisted Pt with drying hair for time management. Pt was left resting in bed with call bell in reach, bed alarm on, and all needs met.  Precautions/Restrictions  Precautions Precautions: Fall;Other (comment) Precaution Comments: L hemiparesis, posterior LOB bias with occasional L lean Restrictions Weight Bearing Restrictions: No General OT Amount of Missed Time: 37 Minutes Vital Signs Therapy Vitals Pulse Rate: 77 Resp: 18 BP: (Abnormal) 123/103 Patient Position (if appropriate): Lying Oxygen Therapy SpO2: 95 % O2 Device: Room Air Pain Pain Assessment Pain Scale: 0-10 Pain Score: 7  Pain Location: Head Pain Descriptors / Indicators: Headache Pain Intervention(s): Medication (See eMAR);Emotional support;Distraction;Repositioned Multiple Pain Sites: No ADL ADL Equipment Provided: Reacher, Long-handled sponge Eating: Supervision/safety Where Assessed-Eating: Edge of bed Grooming: Supervision/safety Where Assessed-Grooming: Sitting at sink Upper Body Bathing: Setup Where Assessed-Upper Body Bathing: Shower Lower Body Bathing: Supervision/safety Where Assessed-Lower Body Bathing: Shower Upper Body Dressing: Setup Where Assessed-Upper Body Dressing: Edge of bed Lower Body Dressing: Contact guard Where Assessed-Lower Body Dressing: Edge of bed Toileting: Contact guard Where Assessed-Toileting: Glass blower/designer: Arts administrator Method: Counselling psychologist: Energy manager: Not assessed Social research officer, government: Curator Method: Wellsite geologist  Equipment: Shower seat with back Vision Baseline Vision/History:  (hx of LASIK sx) Patient Visual Report: Diplopia (intermittent double vision) Vision Assessment?: Yes Eye Alignment: Within Functional Limits Ocular Range of Motion: Within Functional Limits (Pt noted to squint R eye during visual pursuits) Alignment/Gaze Preference: Within Defined Limits Tracking/Visual Pursuits:  (able to track in all fields, however squinting R eye) Saccades: Decreased speed of saccadic movement;Additional eye shifts occurred during testing Convergence: Impaired (comment) (L eye is slower to converge) Visual Fields: No apparent deficits Diplopia Assessment:  (only present when looking at tv, decreased occruance since evaluation) Perception  Perception: Impaired Body Part Identification: impaired R vs L descrimination Spatial Orientation: impaired midline orientation with posterior lean bias Praxis Praxis: Intact Praxis Impairment Details: Motor planning;Initiation Cognition Cognition Overall Cognitive Status: Impaired/Different from baseline Arousal/Alertness: Awake/alert Orientation Level: Person;Place;Situation Person: Oriented Place: Oriented Situation: Oriented Memory: Impaired Memory Impairment: Decreased recall of new information;Decreased short term memory Decreased Short Term Memory: Verbal basic Attention: Focused;Sustained;Selective Focused Attention: Appears intact Sustained Attention: Appears intact Sustained Attention Impairment: Verbal basic Selective Attention: Impaired Awareness: Impaired Awareness Impairment: Emergent impairment Problem Solving: Appears intact Problem Solving Impairment: Verbal complex;Functional complex Executive Function: Reasoning;Sequencing;Organizing;Self  Correcting Reasoning: Impaired Reasoning Impairment: Verbal complex;Functional complex Sequencing: Impaired Sequencing Impairment: Verbal complex;Functional complex Organizing: Impaired Organizing Impairment: Verbal basic Self Monitoring: Impaired Self Monitoring Impairment: Verbal basic;Functional basic Self Correcting: Impaired Self Correcting Impairment: Verbal basic Safety/Judgment: Appears intact Brief Interview for Mental Status (BIMS) Repetition of Three Words (First Attempt): 3 Temporal Orientation: Year: Correct Temporal Orientation: Month: Accurate within 5 days Temporal Orientation: Day: Incorrect Recall: "Sock": Yes, no cue required Recall: "Blue": Yes, no cue required Recall: "Bed": No, could not recall BIMS Summary Score: 12 Sensation Sensation Light Touch: Appears Intact Hot/Cold: Appears Intact Proprioception: Impaired Detail Proprioception Impaired Details: Impaired LLE Stereognosis: Not tested Coordination Gross Motor Movements are Fluid and Coordinated: No Fine Motor Movements are Fluid and Coordinated: Yes Coordination and Movement Description: GM movements impaired due to L hemiparesis and impaired midline orientation Finger Nose Finger Test: smooth movements, however LUE slower than RUE Motor  Motor Motor: Hemiplegia;Abnormal postural alignment and control Motor - Discharge Observations: mild L hemiparesis with posterior lean bias; however, improved since initial evaluation Mobility  Bed Mobility Bed Mobility: Supine to Sit;Sit to Supine Supine to Sit: Supervision/Verbal cueing Sit to Supine: Supervision/Verbal cueing Transfers Sit to Stand: Contact Guard/Touching assist Stand to Sit: Contact Guard/Touching assist  Trunk/Postural Assessment  Cervical Assessment Cervical Assessment: Within Functional Limits Thoracic Assessment Thoracic Assessment: Exceptions to [facility] (mild thoracic rounding) Lumbar Assessment Lumbar Assessment: Exceptions to [facility]  (posterior pelvic tilt) Postural Control Postural Control: Deficits on evaluation Protective Responses: delayed but improved since initial evaluation to be adequate for minor purtubations using RW support Postural Limitations: decreased  Balance Balance Balance Assessed: Yes Standardized Balance Assessment Standardized Balance Assessment: Berg Balance Test (Berg Balance Scale from 01/29/23) Merrilee Jansky Balance Test Sit to Stand: Able to stand using hands after several tries (pt requires use of backs of legs against mat to come to standing in midline) Standing Unsupported: Needs several tries to stand 30 seconds unsupported (has posterior LOB with backs against mat at 25sec then at 43sec) Sitting with Back Unsupported but Feet Supported on Floor or Stool: Able to sit 2 minutes under supervision Stand to Sit: Uses backs of legs against chair to control descent Transfers: Able to transfer with verbal cueing and /or supervision (supervision for safety and heavy reliance on B UE support) Standing Unsupported with  Eyes Closed: Able to stand 10 seconds with supervision (requries close supervision for safety) Standing Ubsupported with Feet Together: Needs help to attain position and unable to hold for 15 seconds From Standing, Reach Forward with Outstretched Arm: Loses balance while trying/requires external support (posterior LOB) From Standing Position, Pick up Object from Floor: Unable to try/needs assist to keep balance From Standing Position, Turn to Look Behind Over each Shoulder: Needs assist to keep from losing balance and falling Turn 360 Degrees: Needs assistance while turning Standing Unsupported, Alternately Place Feet on Step/Stool: Needs assistance to keep from falling or unable to try Standing Unsupported, One Foot in Front: Loses balance while stepping or standing Standing on One Leg: Unable to try or needs assist to prevent fall Total Score: 13 Static Sitting Balance Static Sitting -  Balance Support: Feet supported Static Sitting - Level of Assistance: 5: Stand by assistance Dynamic Sitting Balance Dynamic Sitting - Balance Support: Feet supported Dynamic Sitting - Level of Assistance: 5: Stand by assistance Sitting balance - Comments: left lean Static Standing Balance Static Standing - Balance Support: During functional activity;Bilateral upper extremity supported Static Standing - Level of Assistance: Other (comment);5: Stand by assistance (CGA) Dynamic Standing Balance Dynamic Standing - Balance Support: During functional activity;Bilateral upper extremity supported Dynamic Standing - Level of Assistance: Other (comment);4: Min assist (CGA) Extremity/Trunk Assessment RUE Assessment RUE Assessment: Within Functional Limits Passive Range of Motion (PROM) Comments: WFL Active Range of Motion (AROM) Comments: WFL General Strength Comments: 4/5 LUE Assessment LUE Assessment: Within Functional Limits Passive Range of Motion (PROM) Comments: full range Active Range of Motion (AROM) Comments: Full range General Strength Comments: 3+/5   [name] 02/04/2023, 4:39 PM

## 2023-02-04 NOTE — Progress Notes (Signed)
Patient ID: Patricia [name], female   DOB: 02-13-1946, 76 y.o.   MRN: [id number]  Patient's Soc 2/29.

## 2023-02-04 NOTE — Progress Notes (Signed)
Inpatient Rehabilitation Care Coordinator Discharge Note   Patient Details  Name: Patricia [name] MRN: [id number] Date of Birth: 1946/10/13   Discharge location: Home with spouse and granddaughter  Length of Stay: 19 Days  Discharge activity level: CGA/Min A  Home/community participation: Spouse and granddaughter  Patient response [id number] Literacy - How often do you need to have someone help you when you read instructions, pamphlets, or other written material from your doctor or pharmacy?: Rarely  Patient response [id number] Isolation - How often do you feel lonely or isolated from those around you?: Never  Services provided included: RD, MD, PT, OT, SLP, RN, TR, CM, Pharmacy, Neuropsych, SW  Financial Services:  Charity fundraiser Utilized: [place] [employer]  Choices offered to/list presented to: Patient and spouse  Follow-up services arranged:  Home Health, DME Home Health Agency: [place] [facility] OT SLP    DME : Rolling walker and BSC - [employer]    Patient response to transportation need: Is the patient able to respond to transportation needs?: Yes In the past 12 months, has lack of transportation kept you from medical appointments or from getting medications?: No In the past 12 months, has lack of transportation kept you from meetings, work, or from getting things needed for daily living?: No    Comments (or additional information):  Patient/Family verbalized understanding of follow-up arrangements:  Yes  Individual responsible for coordination of the follow-up plan: [name] [phone]  Confirmed correct DME delivered: [name] 02/04/2023    [name]

## 2023-02-04 NOTE — Plan of Care (Signed)
  Problem: RH Bathing Goal: LTG Patient will bathe all body parts with assist levels (OT) Description: LTG: Patient will bathe all body parts with assist levels (OT) Outcome: Not Met (add Reason) Note: Goal not met d/t Pt requiring supervision when bathing in shower to increase Pt safety 2/2 cognitive deficits.    Problem: RH Dressing Goal: LTG Patient will perform upper body dressing (OT) Description: LTG Patient will perform upper body dressing with assist, with/without cues (OT). Outcome: Completed/Met Goal: LTG Patient will perform lower body dressing w/assist (OT) Description: LTG: Patient will perform lower body dressing with assist, with/without cues in positioning using equipment (OT) Outcome: Completed/Met   Problem: RH Toileting Goal: LTG Patient will perform toileting task (3/3 steps) with assistance level (OT) Description: LTG: Patient will perform toileting task (3/3 steps) with assistance level (OT)  Outcome: Completed/Met   Problem: RH Toilet Transfers Goal: LTG Patient will perform toilet transfers w/assist (OT) Description: LTG: Patient will perform toilet transfers with assist, with/without cues using equipment (OT) Outcome: Completed/Met

## 2023-02-04 NOTE — Discharge Summary (Signed)
Physical Therapy Discharge Summary  Patient Details  Name: Patricia [name] MRN: [id number] Date of Birth: 07/08/1946  Date of Discharge from PT service:February 04, 2023  Today's Date: 02/04/2023 PT Individual Time: 1436-1500 PT Individual Time Calculation (min): 24 min    Patient has met 9 of 9 long term goals due to improved activity tolerance, improved balance, improved postural control, increased strength, ability to compensate for deficits, functional use of  left upper extremity and left lower extremity, improved attention, improved awareness, and improved coordination.  Patient to discharge at an ambulatory level  CGA/min assist using RW  for household mobility then requiring use of wheelchair for community mobility. Patient's care partner attended hands-on education/training and is independent to provide the necessary physical and cognitive assistance at discharge.  All goals met.  Recommendation:  Patient will benefit from ongoing skilled PT services in home health setting to continue to advance safe functional mobility, address ongoing impairments in dynamic standing balance, midline orientation (posterior lean bias), transfer training, dynamic gait training using LRAD, activity tolerance, and minimize fall risk.  Equipment: youth height RW, pt already has hospital bed and wheelchair  Reasons for discharge: treatment goals met and discharge from hospital  Patient/family agrees with progress made and goals achieved: Yes  Skilled Therapeutic Interventions/Progress Updates:  Pt received supine in bed with her husband, [name], and additional family present. Pt agreeable to therapy session despite reports of headache. Pt/family deny any questions at this time and report feeling confident with D/C home tomorrow. Supine<>sitting to/from L EOB, supervision using bed features as pt will have a hospital bed at home. Pt's husband provided all assistance this session to demonstrate  understanding and allow therapist any opportunities to provide feedback in prep for D/C. Pt's husband assist pt with donning shoes, he donned gait belt and set-up RW for pt all without cuing. Sit>stand EOB>RW with CGA for steadying as pt continues to demo posterior lean bias using backs of legs against bed for balance support. Pt ambulated ~135f using RW with CGA provided by her husband, demonstrating proper guarding technique and he was able to safely cue pt throughout to improve her positioning in the AD when needed. Pt/husband report feeling safe performing ambulation at home. Therapist reinforced education on ensuring pt is not dual-tasking during mobility to maintain her safety because she has impaired selective/alternating attention. At end of session, pt left supine in bed with needs in reach and her family present to care for her.  PT Discharge Precautions/Restrictions Precautions Precautions: Fall;Other (comment) Precaution Comments: L hemiparesis, posterior LOB bias with occasional L lean Restrictions Weight Bearing Restrictions: No Pain Pain Assessment Pain Scale: 0-10 Pain Score: 7  Pain Location: Head Pain Descriptors / Indicators: Headache Pain Intervention(s): Medication (See eMAR);Emotional support;Distraction;Repositioned Multiple Pain Sites: No Pain Interference Pain Interference Pain Effect on Sleep: 1. Rarely or not at all Pain Interference with Therapy Activities: 1. Rarely or not at all Pain Interference with Day-to-Day Activities: 1. Rarely or not at all Vision/Perception  Vision - History Ability to See in Adequate Light: 0 Adequate Perception Perception: Impaired Body Part Identification: impaired R vs L descrimination Spatial Orientation: impaired midline orientation with posterior lean bias Praxis Praxis: Impaired Praxis Impairment Details: Motor planning;Initiation  Cognition Overall Cognitive Status: Impaired/Different from baseline Arousal/Alertness:  Awake/alert Orientation Level: Oriented X4 Attention: Focused;Sustained;Selective Focused Attention: Appears intact Sustained Attention: Impaired Selective Attention: Impaired Memory: Impaired Awareness: Impaired Safety/Judgment: Appears intact Sensation Sensation Light Touch: Appears Intact Hot/Cold: Not tested Proprioception: Impaired Detail  Proprioception Impaired Details: Impaired LLE Stereognosis: Not tested Coordination Gross Motor Movements are Fluid and Coordinated: No Coordination and Movement Description: GM movements impaired due to L hemiparesis and impaired midline orientation; however, improved since initial evaluation Motor  Motor Motor: Hemiplegia;Abnormal postural alignment and control Motor - Discharge Observations: mild L hemiparesis with posterior lean bias; however, improved since initial evaluation  Mobility Bed Mobility Bed Mobility: Supine to Sit;Sit to Supine (using hospital bed features per home set-up) Supine to Sit: Supervision/Verbal cueing Sit to Supine: Supervision/Verbal cueing Transfers Transfers: Sit to Stand;Stand to Sit;Stand Pivot Transfers Sit to Stand: Contact Guard/Touching assist Stand to Sit: Contact Guard/Touching assist Stand Pivot Transfers: Contact Guard/Touching assist Stand Pivot Transfer Details: Verbal cues for precautions/safety;Verbal cues for technique;Verbal cues for sequencing;Verbal cues for safe use of DME/AE Transfer (Assistive device): Rolling walker Locomotion  Gait Ambulation: Yes Gait Assistance: Contact Guard/Touching assist;Minimal Assistance - Patient > 75% Gait Distance (Feet): 180 Feet Assistive device: Rolling walker Gait Assistance Details: Verbal cues for safe use of DME/AE;Verbal cues for technique;Verbal cues for sequencing;Verbal cues for gait pattern;Verbal cues for precautions/safety;Tactile cues for weight shifting;Tactile cues for sequencing Gait Gait: Yes Gait Pattern: Impaired Gait Pattern:   (tends to have posterior lean bias, lack of sufficient R weight shift with poor L LE foot clearance and step length) Gait velocity: decreased Stairs / Additional Locomotion Stairs: Yes Stairs Assistance: Minimal Assistance - Patient > 75% Stair Management Technique: Two rails;Step to pattern Number of Stairs: 8 Height of Stairs: 6 Ramp: Contact Guard/touching assist (RW) Curb: Minimal Assistance - Patient >75% (RW) Wheelchair Mobility Wheelchair Mobility: No  Trunk/Postural Assessment  Cervical Assessment Cervical Assessment: Within Functional Limits Thoracic Assessment Thoracic Assessment: Exceptions to [facility] (mild thoracic rounding) Lumbar Assessment Lumbar Assessment: Exceptions to [facility] (posterior pelvic tilt) Postural Control Postural Control: Deficits on evaluation Protective Responses: delayed but improved since initial evaluation to be adequate for minor purtubations using RW support Postural Limitations: decreased  Balance Balance Balance Assessed: Yes Standardized Balance Assessment Standardized Balance Assessment: Berg Balance Test (Berg Balance Scale from 01/29/23) Merrilee Jansky Balance Test Sit to Stand: Able to stand using hands after several tries (pt requires use of backs of legs against mat to come to standing in midline) Standing Unsupported: Needs several tries to stand 30 seconds unsupported (has posterior LOB with backs against mat at 25sec then at 43sec) Sitting with Back Unsupported but Feet Supported on Floor or Stool: Able to sit 2 minutes under supervision Stand to Sit: Uses backs of legs against chair to control descent Transfers: Able to transfer with verbal cueing and /or supervision (supervision for safety and heavy reliance on B UE support) Standing Unsupported with Eyes Closed: Able to stand 10 seconds with supervision (requries close supervision for safety) Standing Ubsupported with Feet Together: Needs help to attain position and unable to hold for 15  seconds From Standing, Reach Forward with Outstretched Arm: Loses balance while trying/requires external support (posterior LOB) From Standing Position, Pick up Object from Floor: Unable to try/needs assist to keep balance From Standing Position, Turn to Look Behind Over each Shoulder: Needs assist to keep from losing balance and falling Turn 360 Degrees: Needs assistance while turning Standing Unsupported, Alternately Place Feet on Step/Stool: Needs assistance to keep from falling or unable to try Standing Unsupported, One Foot in Front: Loses balance while stepping or standing Standing on One Leg: Unable to try or needs assist to prevent fall Total Score: 13 Static Sitting Balance Static Sitting - Balance Support: Feet  supported Static Sitting - Level of Assistance: 5: Stand by assistance Dynamic Sitting Balance Dynamic Sitting - Balance Support: Feet supported Dynamic Sitting - Level of Assistance: 5: Stand by assistance Static Standing Balance Static Standing - Balance Support: During functional activity;Bilateral upper extremity supported Static Standing - Level of Assistance: Other (comment);5: Stand by assistance (CGA) Dynamic Standing Balance Dynamic Standing - Balance Support: During functional activity;Bilateral upper extremity supported Dynamic Standing - Level of Assistance: Other (comment);4: Min assist (CGA) Extremity Assessment      RLE Assessment RLE Assessment: Exceptions to [facility] RLE Strength RLE Overall Strength: Deficits RLE Overall Strength Comments: assessed in sitting Right Hip Flexion: 4-/5 Right Knee Flexion: 4/5 Right Knee Extension: 4+/5 Right Ankle Dorsiflexion: 4+/5 Right Ankle Plantar Flexion: 4/5 LLE Assessment LLE Assessment: Exceptions to [facility] Active Range of Motion (AROM) Comments: no reports of calf pain today LLE Strength LLE Overall Strength: Deficits LLE Overall Strength Comments: assessed in sitting Left Hip Flexion: 3+/5 Left Knee Flexion:  4-/5 Left Knee Extension: 4/5 Left Ankle Dorsiflexion: 3+/5 Left Ankle Plantar Flexion: 3+/5   Tawana Scale , PT, DPT, NCS, CSRS 02/04/2023, 3:07 PM

## 2023-02-04 NOTE — Plan of Care (Signed)
  Problem: RH Swallowing Goal: LTG Patient will consume least restrictive diet using compensatory strategies with assistance (SLP) Description: LTG:  Patient will consume least restrictive diet using compensatory strategies with assistance (SLP) Outcome: Not Met (add Reason)   Problem: RH Memory Goal: LTG Patient will demonstrate ability for day to day (SLP) Description: LTG:   Patient will demonstrate ability for day to day recall/carryover during cognitive/linguistic activities with assist  (SLP) Outcome: Not Met (add Reason) Goal: LTG Patient will use memory compensatory aids to (SLP) Description: LTG:  Patient will use memory compensatory aids to recall biographical/new, daily complex information with cues (SLP) Outcome: Not Met (add Reason)   Problem: RH Attention Goal: LTG Patient will demonstrate this level of attention during functional activites (SLP) Description: LTG:  Patient will will demonstrate this level of attention during functional activites (SLP) Outcome: Completed/Met   Problem: RH Awareness Goal: LTG: Patient will demonstrate awareness during functional activites type of (SLP) Description: LTG: Patient will demonstrate awareness during functional activites type of (SLP) Outcome: Completed/Met

## 2023-02-04 NOTE — Progress Notes (Signed)
Speech Language Pathology Discharge Summary  Patient Details  Name: Patricia [name] MRN: [id number] Date of Birth: 1945-12-15  Date of Discharge from SLP service:February 04, 2023  Today's Date: 02/04/2023 SLP Individual Time: [id number] SLP Individual Time Calculation (min): 45 min  Skilled Therapeutic Interventions: Skilled ST treatment focused on cognitive goals. Pt was greeted semi-reclined in bed on arrival and requesting to go to the bathroom. Pt transferred to w/c using RW and CGA. Pt had continent urine void and completed completed toileting tasks with CGA. Pt was returned to bed for remainder of session.   SLP facilitated cognitive re-assessment with the SLUMS. Pt scored 17/30 points. Results were suggestive of improvement since initial evaluation (score 13/30 on 01/18/23). Pt exhibited increased difficulty with memory, executive functioning, visuospatial skills, calculations, and mental manipulation components. SLP provided education on results, and reinforced memory and problem solving strategies. Pt verbalized understanding through teach back.  Education completed. Patient was left in bed with alarm activated and immediate needs within reach at end of session.   Patient has met 2 of 5 long term goals.  Patient to discharge at [facility] level.  Reasons goals not met: Slower than anticipated progress   Clinical Impression/Discharge Summary: Patient has made slow yet functional gains and has met 2 of 5 long-term goals this admission. Overall, patient requires Min A verbal cues to complete functional and familiar tasks safely in regards to sustained attention, emergent awareness, and recall. Patient is consuming regular textures with thin liquids with minimal overt s/s of aspiration and intermittent supervision level verbal cues for use of swallowing compensatory strategies. Patient and family education is complete and patient to discharge at overall min A level. Patient's care partner is  independent to provide the necessary physical and cognitive assistance at discharge. Patient would benefit from continued SLP services in home health setting to maximize cognitive function, swallowing safety, and functional independence.      Care Partner:  Caregiver Able to Provide Assistance: Yes  Type of Caregiver Assistance: Cognitive;Physical  Recommendation:  24 hour supervision/assistance;Home Health SLP  Rationale for SLP Follow Up: Maximize cognitive function and independence;Maximize swallowing safety   Equipment: N/A   Reasons for discharge: Discharged from hospital   Patient/Family Agrees with Progress Made and Goals Achieved: Yes    [name] 02/04/2023, 7:57 PM

## 2023-02-05 ENCOUNTER — Other Ambulatory Visit (HOSPITAL_COMMUNITY): Payer: Self-pay

## 2023-02-05 ENCOUNTER — Encounter: Payer: Self-pay | Admitting: Internal Medicine

## 2023-02-05 LAB — POTASSIUM: Potassium: 3.1 mmol/L — ABNORMAL LOW (ref 3.5–5.1)

## 2023-02-05 MED ORDER — POTASSIUM CHLORIDE CRYS ER 20 MEQ PO TBCR
20.0000 meq | EXTENDED_RELEASE_TABLET | Freq: Three times a day (TID) | ORAL | 0 refills | Status: DC
  Filled 2023-02-05: qty 90, 30d supply, fill #0

## 2023-02-05 MED ORDER — POTASSIUM CHLORIDE CRYS ER 20 MEQ PO TBCR: 20.0000 meq | EXTENDED_RELEASE_TABLET | Freq: Three times a day (TID) | ORAL | Status: DC

## 2023-02-05 NOTE — Progress Notes (Signed)
PROGRESS NOTE   Subjective/Complaints:   DIscussed K+ and need for f/u lab as OP (scheduled)  ROS:+constipation, -nausea.  Denies fevers, chills, CP, SOB, abd pain, vomiting, diarrhea.   Objective:   No results found. Recent Labs    02/03/23 0945  WBC 8.3  HGB 12.3  HCT 37.0  PLT 281      Recent Labs    02/03/23 0945 02/05/23 0732  NA 138  --   K 3.3* 3.1*  CL 105  --   CO2 21*  --   GLUCOSE 115*  --   BUN 14  --   CREATININE 0.90  --   CALCIUM 9.6  --       Intake/Output Summary (Last 24 hours) at 02/05/2023 0956 Last data filed at 02/05/2023 0746 Gross per 24 hour  Intake 1200 ml  Output --  Net 1200 ml         Physical Exam: Vital Signs Blood pressure 126/76, pulse 86, temperature 98 F (36.7 C), resp. rate 16, height 4' 9.99" (1.473 m), weight 60.4 kg, SpO2 97 %.   General: No acute distress, laying in bed Mood and affect are appropriate, happier this week HEENT: R ear with mild serous effusion, no TM erythma or significant bulging, L TM with scant serous effusion, also no TM erythema or bulging--not reassessed today Heart: Regular rate and rhythm no rubs murmurs or extra sounds Lungs: Clear to auscultation, breathing unlabored, no rales, rhonchi, or wheezes, occasional dry cough.  Abdomen: Positive bowel sounds, soft nontender to palpation, nondistended Extremities: No clubbing, cyanosis, or edema, left calf tenderness mild and seems improved this week, no swelling or erythema   Skin: No evidence of breakdown, no evidence of rash  neuro      No apparent deformity.      Strength:               5/5 BUE    Voice:  +Hoarse, breathy vocal quality.      Spasticity: MAS 0 in all extremities.     Assessment/Plan: 1. Functional deficits due to CVA Stable for D/C today F/u PCP in 3-4 weeks F/u PM&R 2 weeks See D/C summary See D/C instructions   Care Tool:  Bathing    Body  parts bathed by patient: Right arm, Left arm, Chest, Abdomen, Front perineal area, Buttocks, Right upper leg, Left upper leg, Face, Left lower leg, Right lower leg   Body parts bathed by helper: Left lower leg, Right lower leg     Bathing assist Assist Level: Supervision/Verbal cueing     Upper Body Dressing/Undressing Upper body dressing   What is the patient wearing?: Pull over shirt    Upper body assist Assist Level: Set up assist    Lower Body Dressing/Undressing Lower body dressing      What is the patient wearing?: Pants     Lower body assist Assist for lower body dressing: Contact Guard/Touching assist     Toileting Toileting    Toileting assist Assist for toileting: Contact Guard/Touching assist     Transfers Chair/bed transfer  Transfers assist     Chair/bed transfer assist level: Contact Guard/Touching assist Chair/bed transfer  assistive device: Museum/gallery exhibitions officer assist      Assist level: Minimal Assistance - Patient > 75% Assistive device: Walker-rolling Max distance: 136f   Walk 10 feet activity   Assist     Assist level: Contact Guard/Touching assist Assistive device: Walker-rolling   Walk 50 feet activity   Assist Walk 50 feet with 2 turns activity did not occur: Safety/medical concerns  Assist level: Contact Guard/Touching assist Assistive device: Walker-rolling    Walk 150 feet activity   Assist Walk 150 feet activity did not occur: Safety/medical concerns  Assist level: Minimal Assistance - Patient > 75% Assistive device: Walker-rolling    Walk 10 feet on uneven surface  activity   Assist Walk 10 feet on uneven surfaces activity did not occur: Safety/medical concerns   Assist level: Minimal Assistance - Patient > 75% (ramp) Assistive device: Walker-rolling   Wheelchair     Assist Is the patient using a wheelchair?: Yes Type of Wheelchair: Manual    Wheelchair assist level:  Moderate Assistance - Patient 50 - 74% Max wheelchair distance: 150'    Wheelchair 50 feet with 2 turns activity    Assist        Assist Level: Moderate Assistance - Patient 50 - 74%   Wheelchair 150 feet activity     Assist      Assist Level: Moderate Assistance - Patient 50 - 74%   Blood pressure 126/76, pulse 86, temperature 98 F (36.7 C), resp. rate 16, height 4' 9.99" (1.473 m), weight 60.4 kg, SpO2 97 %.  Medical Problem List and Plan: 1. 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 metastatic             -patient may shower             -DC home today    2. Antithrombotics: -DVT/anticoagulation:  Pharmaceutical: Eliquis 5 mg BID>>inc to '10mg'$  BID x 7d (01/20/23-01/27/23) then resume '5mg'$  BID starting 01/27/23 PM -antiplatelet therapy: N/A - was on ASA and Plavix prior to stroke, now on Eliquis only, will be on '10mg'$  BID x7d then resume '5mg'$ , Dr MJulien Nordmannin agreement   3. LBP/Pain Management: Will continue tylenol '650mg'$  q6h PRN for now.  -order ben gay for Left calf pain-- changed to scheduled TID 01/25/23 -Lidoderm patch to neck QD 4. Mood/Behavior/Sleep: LCSW to follow for evaluation and support.                       --Melatonin '6mg'$  QHS PRN, Trazodone prn for insomnia.        -antipsychotic agents: N/A 5. Neuropsych/cognition: This patient is capable of making decisions on her own behalf. 6. Skin/Wound Care: Routine pressure relief measures.   7. Fluids/Electrolytes/Nutrition: Monitor I/O. Monitor weekly labs mild hypoK , now on KCL 283m BID, recheck in am , will need daily KCL supplementation now that HCTZ restarted      Latest Ref Rng & Units 02/05/2023    7:32 AM 02/03/2023    9:45 AM 01/31/2023    7:22 AM  BMP  Glucose 70 - 99 mg/dL  115  110   BUN 8 - 23 mg/dL  14  16   Creatinine 0.44 - 1.00 mg/dL  0.90  0.81   Sodium 135 - 145 mmol/L  138  140   Potassium 3.5 - 5.1 mmol/L 3.1  3.3  3.8   Chloride 98 - 111  mmol/L  105  111   CO2 22 - 32 mmol/L  21  21   Calcium 8.9 - 10.3 mg/dL  9.6  9.3      8. HTN: Monitor BP TID--continue to hold Valsartan-HCTZ 160-'25mg'$  QD.              --SBP trending 100- 140 range. Avoid hypoperfusion  -01/31/23 BPs creeping up may resume valsartan if trend continues next wk , cont to monitor -02/01/23 BP fairly stable, monitor -02/02/23 BP continuing to have spikes, will restart HCTZ '25mg'$  for now Vitals:   02/01/23 1259 02/01/23 1935 02/02/23 0545 02/02/23 1318  BP: 127/77 (!) 150/83 (!) 151/79 (!) 143/84   02/02/23 1953 02/03/23 0418 02/03/23 1309 02/03/23 1955  BP: (!) 150/79 138/76 117/78 (!) 143/73   02/04/23 0335 02/04/23 1551 02/04/23 1959 02/05/23 0554  BP: (!) 146/82 (!) 123/103 128/71 126/76     9. Stage IV  NSCLC w/lymphadenopathy/muscular nodules: Chemo to resume after discharge. --Per neurology, repeat MRI brain w/ and w/o in 6-8 weeks which would be ` last 2 weeks of March to continue to monitor for potential metastatic disease. Will need neuro outpt f/u 2-4 wks post discharge  10. GERD s/p esophageal dilatation: Continue Protonix '40mg'$  BID. Will add pepcid '20mg'$  BID also.  11. Anxiety/depression: On Paxil '40mg'$  daily w/xanax 0.'5mg'$  QHS prn.   12. Hyperlipidemia: On Crestor '20mg'$  QD  13. Neck pain/Headaches: On PRN Fioricet 2 tabs Q6H, using consistently.  Will add Topamax 25 mg bid.              --lidocaine patch for local measures.              -- Flexeril 5 mg TID PRN for muscle tightness in neck --increased topamax to '50mg'$  BID- reassess for dose  increase in am  -01/25/23 improving, cont regimen -01/29/23 increased topamax '75mg'$  BID No HA c/o 2/26- taking 1-2 fioricet per day , reduce to 1/2 tab, should be able to get by with acetaminophen and topamax '100mg'$  BID  Only took 1/2 fioricet yesterday will d/c  14. 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                -- SLP for swallow, speech              -- ISB   15. Dizziness/nausea/double vision: R-beating nystagmus on exam. Vestibular eval and treat.  -01/18/23 was getting Zofran inpatient, ordered '4mg'$  ODT q8h PRN n/v; also has Compazine PRN  16. Persistent cough since CVA. Causing cervicalgia, HA's, and nausea/vomiting with PO intakes.              --Treat cough -rule out aspiration/post obstructive infection.                -- Duo nebs qid added w/tessalon perles '100mg'$  TID -Improving , has lung ca so would expect some cough   17. Anemia: Hgb down to 10.5 on 01/10/23, stable at 10.7 on 01/20/23; monitor on weekly labs next [id number]  -Continue Folic acid '1mg'$  QD, multivitamins  18. Hx of rectal surgery, chronic constipation:  -01/18/23 LBM yesterday, but requests stool softeners given hx of rectal surgery. Ordered Miralax 17g QD + PRN, Colace '100mg'$  QD + PRN. Has Dulcolax supp PRN, Fleet's PRN.  -01/19/23 no BM in 2 days, doesn't like miralax; change miralax to PRN only, increase Colace to '100mg'$  BID+PRN, add Senna 8.'6mg'$  QD -Given sorbitol x 1 on 01/20/23;  D/c'd Colace, Senokot, and Miralax on 01/22/23, started dulcolax supp QD and Senokot-S 3 tabs BID on 01/22/23. -01/25/23 now with diarrhea; change Senokot-S to BID PRN, leave Dulcolax supp in place, monitor for now -2/20, BMx 2 on 2/19- change dulc supp to prn -02/01/23 no BM in 2 days, feels urge today, monitor for now Constipation had BM after supp yesterday , cont colace '100mg'$  BID LBM 2/24 will schedule senna S  19. LLE pain: nursing reporting this after assessment this morning 01/18/23, per PT reports-- PT concerned for DVT, no significant swelling noted on exam this morning -01/18/23 ordered doppler U/S bilateral LEs to r/o DVT given hx of malignancy, continue above pain meds -01/20/23 + DVT , increase Eliquis to '10mg'$  BID x 7d then resume '5mg'$  BID- may cont therapy  -01/25/23 scheduled [place] as above -01/30/23 much better pain control, cont to  monitor  20. R ear pain: TMs with mild serous effusion but no evidence of AOM, offered flonase but pt declined, monitor for now.   -02/02/23 improved, monitor   LOS: 19 days A FACE TO FACE EVALUATION WAS PERFORMED  Charlett [name] 02/05/2023, 9:56 AM

## 2023-02-05 NOTE — Progress Notes (Signed)
Recreational Therapy Discharge Summary Patient Details  Name: Patricia [name] MRN: [id number] Date of Birth: [birth year] Today's Date: 02/05/2023  Comments on progress toward goals: Pt referred by team for pt education in regards to activity analysis/modifications and stress management/coping.  Education provided to pt individually as well in in a group session.  Pt is motivated to regain further independence and return to previously enjoyed activities as able.  Pt is discharging home today with family to provide the needed supervision/assistance.  Reasons for discharge: discharge from hospital  Follow-up: [place] agrees with progress made and goals achieved: Yes  [name] 02/05/2023, 11:06 AM

## 2023-02-07 ENCOUNTER — Telehealth: Payer: Self-pay | Admitting: Medical Oncology

## 2023-02-07 ENCOUNTER — Other Ambulatory Visit: Payer: Self-pay

## 2023-02-07 ENCOUNTER — Telehealth: Payer: Self-pay

## 2023-02-07 DIAGNOSIS — K219 Gastro-esophageal reflux disease without esophagitis: Secondary | ICD-10-CM | POA: Diagnosis not present

## 2023-02-07 DIAGNOSIS — R69 Illness, unspecified: Secondary | ICD-10-CM | POA: Diagnosis not present

## 2023-02-07 DIAGNOSIS — F418 Other specified anxiety disorders: Secondary | ICD-10-CM | POA: Diagnosis not present

## 2023-02-07 DIAGNOSIS — R599 Enlarged lymph nodes, unspecified: Secondary | ICD-10-CM | POA: Diagnosis not present

## 2023-02-07 DIAGNOSIS — R490 Dysphonia: Secondary | ICD-10-CM | POA: Diagnosis not present

## 2023-02-07 DIAGNOSIS — E7800 Pure hypercholesterolemia, unspecified: Secondary | ICD-10-CM | POA: Diagnosis not present

## 2023-02-07 DIAGNOSIS — I10 Essential (primary) hypertension: Secondary | ICD-10-CM | POA: Diagnosis not present

## 2023-02-07 DIAGNOSIS — C3412 Malignant neoplasm of upper lobe, left bronchus or lung: Secondary | ICD-10-CM | POA: Diagnosis not present

## 2023-02-07 DIAGNOSIS — Z79630 Long term (current) use of alkylating agent: Secondary | ICD-10-CM | POA: Diagnosis not present

## 2023-02-07 DIAGNOSIS — M79605 Pain in left leg: Secondary | ICD-10-CM | POA: Diagnosis not present

## 2023-02-07 DIAGNOSIS — I69354 Hemiplegia and hemiparesis following cerebral infarction affecting left non-dominant side: Secondary | ICD-10-CM | POA: Diagnosis not present

## 2023-02-07 NOTE — Telephone Encounter (Signed)
Verbal orders given per discharge summary

## 2023-02-07 NOTE — Telephone Encounter (Signed)
I recommended [name] contact referring physician Alysia Penna, MD.) for PT orders.

## 2023-02-09 NOTE — Progress Notes (Unsigned)
[facility] OFFICE PROGRESS NOTE  Curt Bears, MD [address] [place] [address]  DIAGNOSIS: Stage IV (T1c, N3, M1 C) non-small cell lung cancer, adenocarcinoma presented with left upper lobe metastatic neoplasm presented with left lower lobe lung nodule in addition to extensive lymphadenopathy involving the neck, left hilar and mediastinal lymphadenopathy as well as upper abdomen lymph nodes and multiple subcutaneous/muscular nodules diagnosed in January 2024.   Molecular studies: [id number]   PD-L1 expression: 99***    PRIOR THERAPY: None  CURRENT THERAPY: Systemic chemotherapy with carboplatin for AUC of 5, Alimta 500 Mg/M2 and Keytruda 200 Mg IV every 3 weeks.  First dose is planned for December 30, 2022. ***Keytruda alone***?  INTERVAL HISTORY: Patricia [name] 77 y.o. female returns to clinic today for follow-up visit accompanied by ***the patient was diagnosed with lung cancer in January 2024.  She underwent her first cycle of treatment with chemoimmunotherapy and unfortunately presented to the emergency room on 01/10/2023 and was found to have embolic strokes bilaterally.  She had been endorsing headaches for 2 weeks, but on the day of admission she had a left lower extremity weakness and difficulty walking.  Neurology felt that her strokes were from hypercoagulable state.  She was started on Eliquis and she later went to rehab with physical therapy and Occupational Therapy and was discharged on 02/05/2023.  During her time at rehab, the patient was found to have bilateral lower extremity DVTs for which her Eliquis was increased.  For headache she was started on Topamax.  Overall since being discharged the patient is feeling***.  Ask her if she be interested in single agent Keytruda?  She denies any fever, chills, or night sweats.  Weight loss?  Cough?  Takes Tessalon?  Shortness of breath?  Denies any chest pain or hemoptysis.  Denies any nausea, vomiting,  diarrhea, or constipation.  Headaches?  Extremity weakness?  Speech changes?  She is here today for evaluation and more detailed discussion about her current condition and recommended treatment options.  MEDICAL HISTORY: Past Medical History:  Diagnosis Date   Depression    GERD (gastroesophageal reflux disease)    Hypercholesterolemia    IFG (impaired fasting glucose)    Nonintractable episodic headache 01/18/2023   Osteopenia    PONV (postoperative nausea and vomiting)     ALLERGIES:  is allergic to codeine.  MEDICATIONS:  Current Outpatient Medications  Medication Sig Dispense Refill   ALPRAZolam (XANAX) 0.5 MG tablet TAKE (1) TABLET BY MOUTH AT BEDTIME AS NEEDED FOR SLEEP. (Patient taking differently: Take 0.5 mg by mouth at bedtime as needed for anxiety or sleep. TAKE (1) TABLET BY MOUTH AT BEDTIME AS NEEDED FOR SLEEP.) 30 tablet 1   apixaban (ELIQUIS) 5 MG TABS tablet Take 1 tablet (5 mg total) by mouth 2 (two) times daily. 60 tablet 0   benzonatate (TESSALON) 100 MG capsule Take 1 capsule (100 mg total) by mouth 3 (three) times daily. 90 capsule 0   butalbital-acetaminophen-caffeine (FIORICET) 50-325-40 MG tablet Take 1/2 tablet by mouth every 6 (six) hours as needed for headache. 14 tablet 0   cyclobenzaprine (FLEXERIL) 5 MG tablet Take 1 tablet (5 mg total) by mouth 3 (three) times daily as needed for muscle spasms (neck/shoulder pain). 30 tablet 0   docusate sodium (COLACE) 100 MG capsule Take 1 capsule (100 mg total) by mouth 2 (two) times daily. 60 capsule 0   famotidine (PEPCID) 20 MG tablet Take 1 tablet (20 mg total) by  mouth 2 (two) times daily. 60 tablet 0   folic acid (FOLVITE) 1 MG tablet Take 1 tablet (1 mg total) by mouth daily. 30 tablet 0   hydrochlorothiazide (HYDRODIURIL) 25 MG tablet Take 1 tablet (25 mg total) by mouth daily. 30 tablet 0   lansoprazole (PREVACID) 30 MG capsule Take one po qd for acid reflux 90 capsule 1   lidocaine (LIDODERM) 5 % Place 1 patch  onto the skin daily. Remove & Discard patch within 12 hours or as directed by MD 30 patch 0   Menthol-Methyl Salicylate (MUSCLE RUB) 10-15 % CREA Apply 1 Application topically 3 (three) times daily to bilateral calves 85 g 0   Multiple Vitamin (MULTIVITAMIN WITH MINERALS) TABS tablet Take 1 tablet by mouth daily.     PARoxetine (PAXIL) 40 MG tablet TAKE 1 TABLET BY MOUTH EVERY DAY IN THE MORNING (Patient taking differently: Take 40 mg by mouth in the morning.) 90 tablet 2   potassium chloride SA (KLOR-CON M) 20 MEQ tablet Take 1 tablet (20 mEq total) by mouth 3 (three) times daily. 90 tablet 0   prochlorperazine (COMPAZINE) 10 MG tablet Take 1 tablet (10 mg total) by mouth every 6 (six) hours as needed for nausea or vomiting. 30 tablet 0   rosuvastatin (CRESTOR) 20 MG tablet Take 1 tablet (20 mg total) by mouth daily. 30 tablet 0   senna-docusate (SENOKOT-S) 8.6-50 MG tablet Take 3 tablets by mouth 2 (two) times daily. 180 tablet 0   topiramate (TOPAMAX) 100 MG tablet Take 1 tablet (100 mg total) by mouth 2 (two) times daily. 60 tablet 0   No current facility-administered medications for this visit.    SURGICAL HISTORY:  Past Surgical History:  Procedure Laterality Date   ABDOMINAL HYSTERECTOMY     CATARACT EXTRACTION W/PHACO  09/12/2011   Procedure: CATARACT EXTRACTION PHACO AND INTRAOCULAR LENS PLACEMENT (IOC);  Surgeon: Tonny Branch;  Location: [facility];  Service: Ophthalmology;  Laterality: Left;  CDE: 8.91   CATARACT EXTRACTION W/PHACO  11/18/2011   Procedure: CATARACT EXTRACTION PHACO AND INTRAOCULAR LENS PLACEMENT (IOC);  Surgeon: Tonny Branch;  Location: [facility];  Service: Ophthalmology;  Laterality: Right;  CDE:10.26   COLONOSCOPY  2007   Dr. Gala Romney: internal hemorrhoids, single anal papilla poor prep.    COLONOSCOPY N/A 03/27/2017   Rourk: Normal exam.   ECTOPIC PREGNANCY SURGERY     removal of right tube   ESOPHAGOGASTRODUODENOSCOPY (EGD) WITH PROPOFOL N/A 09/10/2021   Procedure:  ESOPHAGOGASTRODUODENOSCOPY (EGD) WITH PROPOFOL;  Surgeon: Daneil Dolin, MD;  Location: [facility];  Service: Endoscopy;  Laterality: N/A;  8:15AM   MALONEY DILATION N/A 09/10/2021   Procedure: Venia Minks DILATION;  Surgeon: Daneil Dolin, MD;  Location: [facility];  Service: Endoscopy;  Laterality: N/A;   OVARIAN CYST REMOVAL     right   RECTOCELE REPAIR N/A 01/18/2014   Procedure: POSTERIOR REPAIR (RECTOCELE);  Surgeon: Jonnie Kind, MD;  Location: [facility];  Service: Gynecology;  Laterality: N/A;   VAGINAL HYSTERECTOMY N/A 01/18/2014   Procedure: HYSTERECTOMY VAGINAL;  Surgeon: Jonnie Kind, MD;  Location: [facility];  Service: Gynecology;  Laterality: N/A;    REVIEW OF SYSTEMS:   Review of Systems  Constitutional: Negative for appetite change, chills, fatigue, fever and unexpected weight change.  HENT:   Negative for mouth sores, nosebleeds, sore throat and trouble swallowing.   Eyes: Negative for eye problems and icterus.  Respiratory: Negative for cough, hemoptysis, shortness of breath and wheezing.  Cardiovascular: Negative for chest pain and leg swelling.  Gastrointestinal: Negative for abdominal pain, constipation, diarrhea, nausea and vomiting.  Genitourinary: Negative for bladder incontinence, difficulty urinating, dysuria, frequency and hematuria.   Musculoskeletal: Negative for back pain, gait problem, neck pain and neck stiffness.  Skin: Negative for itching and rash.  Neurological: Negative for dizziness, extremity weakness, gait problem, headaches, light-headedness and seizures.  Hematological: Negative for adenopathy. Does not bruise/bleed easily.  Psychiatric/Behavioral: Negative for confusion, depression and sleep disturbance. The patient is not nervous/anxious.     PHYSICAL EXAMINATION:  There were no vitals taken for this visit.  ECOG PERFORMANCE STATUS: {CHL ONC ECOG [id number]  Physical Exam  Constitutional: Oriented to person, place, and time and  well-developed, well-nourished, and in no distress. No distress.  HENT:  Head: Normocephalic and atraumatic.  Mouth/Throat: Oropharynx is clear and moist. No oropharyngeal exudate.  Eyes: Conjunctivae are normal. Right eye exhibits no discharge. Left eye exhibits no discharge. No scleral icterus.  Neck: Normal range of motion. Neck supple.  Cardiovascular: Normal rate, regular rhythm, normal heart sounds and intact distal pulses.   Pulmonary/Chest: Effort normal and breath sounds normal. No respiratory distress. No wheezes. No rales.  Abdominal: Soft. Bowel sounds are normal. Exhibits no distension and no mass. There is no tenderness.  Musculoskeletal: Normal range of motion. Exhibits no edema.  Lymphadenopathy:    No cervical adenopathy.  Neurological: Alert and oriented to person, place, and time. Exhibits normal muscle tone. Gait normal. Coordination normal.  Skin: Skin is warm and dry. No rash noted. Not diaphoretic. No erythema. No pallor.  Psychiatric: Mood, memory and judgment normal.  Vitals reviewed.  LABORATORY DATA: Lab Results  Component Value Date   WBC 8.3 02/03/2023   HGB 12.3 02/03/2023   HCT 37.0 02/03/2023   MCV 88.3 02/03/2023   PLT 281 02/03/2023      Chemistry      Component Value Date/Time   NA 138 02/03/2023 0945   NA 139 08/02/2022 1112   K 3.1 (L) 02/05/2023 0732   CL 105 02/03/2023 0945   CO2 21 (L) 02/03/2023 0945   BUN 14 02/03/2023 0945   BUN 16 08/02/2022 1112   CREATININE 0.90 02/03/2023 0945   CREATININE 0.76 01/03/2023 1142      Component Value Date/Time   CALCIUM 9.6 02/03/2023 0945   ALKPHOS 76 01/18/2023 0615   AST 20 01/18/2023 0615   AST 16 01/03/2023 1142   ALT 22 01/18/2023 0615   ALT 23 01/03/2023 1142   BILITOT 0.6 01/18/2023 0615   BILITOT 0.6 01/03/2023 1142       RADIOGRAPHIC STUDIES:  VAS [name] LOWER EXTREMITY VENOUS (DVT)  Result Date: 01/21/2023  Lower Venous DVT Study Patient Name:  NORMANDIE BLANKS Sippel  Date of Exam:    01/20/2023 Medical Rec #: [id number]          Accession #:    [id number] Date of Birth: [birth year]           Patient Gender: F Patient Age:   77 years Exam [facility]  [facility] Procedure:      VAS [name] LOWER EXTREMITY VENOUS (DVT) Referring Phys: PAMELA LOVE --------------------------------------------------------------------------------  Indications: Pain.  Risk Factors: Immobility. Comparison Study: No prior study on file Performing Technologist: Sharion Dove RVS  Examination Guidelines: A complete evaluation includes B-mode imaging, spectral Doppler, color Doppler, and power Doppler as needed of all accessible portions of each vessel. Bilateral testing is considered an integral part of a complete examination.  Limited examinations for reoccurring indications may be performed as noted. The reflux portion of the exam is performed with the patient in reverse Trendelenburg.  +---------+---------------+---------+-----------+----------+--------------+ RIGHT    CompressibilityPhasicitySpontaneityPropertiesThrombus Aging +---------+---------------+---------+-----------+----------+--------------+ CFV      Full           Yes      Yes                                 +---------+---------------+---------+-----------+----------+--------------+ SFJ      Full                                                        +---------+---------------+---------+-----------+----------+--------------+ FV Prox  Full                                                        +---------+---------------+---------+-----------+----------+--------------+ FV Mid   Full                                                        +---------+---------------+---------+-----------+----------+--------------+ FV DistalNone                                                        +---------+---------------+---------+-----------+----------+--------------+ PFV      Full                                                         +---------+---------------+---------+-----------+----------+--------------+ POP      None           Yes      Yes                                 +---------+---------------+---------+-----------+----------+--------------+ PTV      None                                         Acute          +---------+---------------+---------+-----------+----------+--------------+ PERO     None                                         Acute          +---------+---------------+---------+-----------+----------+--------------+   +---------+---------------+---------+-----------+----------+--------------+ LEFT     CompressibilityPhasicitySpontaneityPropertiesThrombus Aging +---------+---------------+---------+-----------+----------+--------------+ CFV      Full           Yes  Yes                                 +---------+---------------+---------+-----------+----------+--------------+ SFJ      Full                                                        +---------+---------------+---------+-----------+----------+--------------+ FV Prox  Full                                                        +---------+---------------+---------+-----------+----------+--------------+ FV Mid   Full                                                        +---------+---------------+---------+-----------+----------+--------------+ FV DistalNone                                         Acute          +---------+---------------+---------+-----------+----------+--------------+ PFV      Full                                                        +---------+---------------+---------+-----------+----------+--------------+ POP      Full           Yes      Yes                                 +---------+---------------+---------+-----------+----------+--------------+ PTV      None                                         Acute           +---------+---------------+---------+-----------+----------+--------------+ PERO     None                                         Acute          +---------+---------------+---------+-----------+----------+--------------+ Gastroc  Full                                                        +---------+---------------+---------+-----------+----------+--------------+     Summary: RIGHT: - Findings consistent with acute deep vein thrombosis involving the right popliteal vein, right posterior tibial veins, right peroneal veins, and distal right femoral vein.  LEFT: -  Findings consistent with acute deep vein thrombosis involving the left posterior tibial veins, and left peroneal veins.  *See table(s) above for measurements and observations. Electronically signed by Orlie Pollen on 01/21/2023 at 12:09:25 PM.    Final    DG Chest 2 View  Result Date: 01/20/2023 CLINICAL DATA:  Cough EXAM: CHEST - 2 VIEW COMPARISON:  01/17/2023 FINDINGS: Low lung volumes. No acute airspace disease or pleural effusion. Normal cardiomediastinal silhouette. Aortic atherosclerosis. No pneumothorax. IMPRESSION: No active cardiopulmonary disease. Low lung volumes. Electronically Signed   By: Donavan Foil M.D.   On: 01/20/2023 20:16   DG CHEST PORT 1 VIEW  Result Date: 01/17/2023 CLINICAL DATA:  Dyspnea, weakness EXAM: PORTABLE CHEST 1 VIEW COMPARISON:  03/26/2005 FINDINGS: No focal consolidation. No pleural effusion or pneumothorax. Heart and mediastinal contours are unremarkable. No acute osseous abnormality. IMPRESSION: No active disease. Electronically Signed   By: Kathreen Devoid M.D.   On: 01/17/2023 14:08   DG Swallowing Func-Speech Pathology  Result Date: 01/13/2023 Table formatting from the original result was not included. Objective Swallowing Evaluation: Type of Study: MBS-Modified Barium Swallow Study  Patient Details Name: Patricia [name] MRN: [id number] Date of Birth: 06/05/1946 Today's Date: 01/13/2023 Time: SLP  Start Time (ACUTE ONLY): 0940 -SLP Stop Time (ACUTE ONLY): 0956 SLP Time Calculation (min) (ACUTE ONLY): 16 min Past Medical History: Past Medical History: Diagnosis Date  Depression   GERD (gastroesophageal reflux disease)   Hypercholesterolemia   IFG (impaired fasting glucose)   Osteopenia   PONV (postoperative nausea and vomiting)  Past Surgical History: Past Surgical History: Procedure Laterality Date  ABDOMINAL HYSTERECTOMY    CATARACT EXTRACTION W/PHACO  09/12/2011  Procedure: CATARACT EXTRACTION PHACO AND INTRAOCULAR LENS PLACEMENT ([place]);  Surgeon: Tonny Branch;  Location: [facility];  Service: Ophthalmology;  Laterality: Left;  CDE: 8.91  CATARACT EXTRACTION W/PHACO  11/18/2011  Procedure: CATARACT EXTRACTION PHACO AND INTRAOCULAR LENS PLACEMENT (IOC);  Surgeon: Tonny Branch;  Location: [facility];  Service: Ophthalmology;  Laterality: Right;  CDE:10.26  COLONOSCOPY  2007  Dr. Gala Romney: internal hemorrhoids, single anal papilla poor prep.   COLONOSCOPY N/A 03/27/2017  Rourk: Normal exam.  ECTOPIC PREGNANCY SURGERY    removal of right tube  ESOPHAGOGASTRODUODENOSCOPY (EGD) WITH PROPOFOL N/A 09/10/2021  Procedure: ESOPHAGOGASTRODUODENOSCOPY (EGD) WITH PROPOFOL;  Surgeon: Daneil Dolin, MD;  [facility];  Service: Endoscopy;  Laterality: N/A;  8:15AM  MALONEY DILATION N/A 09/10/2021  Procedure: Venia Minks DILATION;  Surgeon: Daneil Dolin, MD;  [facility];  Service: Endoscopy;  Laterality: N/A;  OVARIAN CYST REMOVAL    right  RECTOCELE REPAIR N/A 01/18/2014  Procedure: POSTERIOR REPAIR (RECTOCELE);  Surgeon: Jonnie Kind, MD;  Location: [facility];  Service: Gynecology;  Laterality: N/A;  VAGINAL HYSTERECTOMY N/A 01/18/2014  Procedure: HYSTERECTOMY VAGINAL;  Surgeon: Jonnie Kind, MD;  Location: [facility];  Service: Gynecology;  Laterality: N/A; HPI: Ms. Stohler is a 77 yo female with PMH Stage IV NSCLC adenocarcinoma (extensive lymphadenopathy involving the neck, left hilar and mediastinal  lymphadenopathy as well as upper abdomen lymph nodes and multiple subcutaneous/muscular nodules diagnosed in January 2024) on palliative systemic chemo, depression, GERD, HLD.  She presented on 01/11/23 with acute onset of left lower extremity weakness notably dragging her left leg starting on approximately Thursday.  She was recently seen approximately 1 week ago for new onset of headache and had no focal neurodeficits at that time.  She was admitted for stroke workup.  MRI brain was performed which  showed small scattered acute and subacute infarcts in the bilateral cerebral hemisphere; Pt with choking episode on 01/11/33 during lunch tray, so BSE completed and pt placed on D3/thin liquid diet with precautions; MBS recommended to r/o silent aspiration/determine safest diet consistency/assess swallow function under VF d/t new CVA dx/L adinocarcinoma present.  Pt denoted she "got choked on Frosted flakes" this morning.  Subjective: "I got choked on Frosted Flakes"  Recommendations for follow up therapy are one component of a multi-disciplinary discharge planning process, led by the attending physician.  Recommendations may be updated based on patient status, additional functional criteria and insurance authorization. Assessment / Plan / Recommendation   01/13/2023  10:00 AM Clinical Impressions Clinical Impression Pt with essentially normal swallow function with exception of piecemeal deglutition with mixed consistency spilling into vallecular space/pyriform (barely) prior to swallow which places pt at risk for penetration/aspiration with this consistency during meals.  Discussed swallowing strategies to eliminate this risk including smaller bites or draining liquid from spoon prior to consuming mixed consistency.  Pt in agreement.  Recommend continue current diet of Mechanical soft/thin (Dysphagia 3) with gravy included with meats and/or mixing with other moist foods to eliminate dryness during consumption of POs.  ST  will f/u for diet tolerance/education/cog tx in acute setting.  Recommend CIR if pt able. SLP Visit Diagnosis Dysphagia, unspecified (R13.10) Impact on safety and function Mild aspiration risk     01/13/2023  10:00 AM Treatment Recommendations Treatment Recommendations Therapy as outlined in treatment plan below     01/13/2023  10:00 AM Prognosis Prognosis for Safe Diet Advancement Good   01/13/2023  10:00 AM Diet Recommendations SLP Diet Recommendations Dysphagia 3 (Mech soft) solids;Thin liquid Liquid Administration via Cup;Straw;Other (Comment) Medication Administration Whole meds with liquid Compensations Slow rate;Small sips/bites;Minimize environmental distractions Postural Changes Seated upright at 90 degrees     01/13/2023  10:00 AM Other Recommendations Oral Care Recommendations Oral care BID;Patient independent with oral care Other Recommendations Other (Comment) Follow Up Recommendations Acute inpatient rehab (3hours/day) Functional Status Assessment Patient has had a recent decline in their functional status and demonstrates the ability to make significant improvements in function in a reasonable and predictable amount of time.   01/13/2023  10:00 AM Frequency and Duration  Speech Therapy Frequency (ACUTE ONLY) min 2x/week Treatment Duration 1 week     01/13/2023  10:00 AM Oral Phase Oral Phase [facility]    01/13/2023  10:00 AM Pharyngeal Phase Pharyngeal Phase Impaired Pharyngeal- Thin Teaspoon WFL Pharyngeal Material does not enter airway Pharyngeal- Thin Cup [facility] Pharyngeal Material does not enter airway Pharyngeal- Thin Straw WFL Pharyngeal Material does not enter airway Pharyngeal- Puree WFL Pharyngeal Material does not enter airway Pharyngeal- Mechanical Soft WFL Pharyngeal Material does not enter airway Pharyngeal- Regular NT Pharyngeal- Multi-consistency Delayed swallow initiation-vallecula;Delayed swallow initiation-pyriform sinuses Pharyngeal Material does not enter airway;Other (Comment) Pharyngeal- Pill NT  Pharyngeal Comment Pt's swallow appeared within functional limits with exception of mixed consistencies which entered vallecular space and min in pyriforms prior to swallow, but did NOT enter airway    01/13/2023  10:00 AM Cervical Esophageal Phase  Cervical Esophageal Phase [facility] Elvina Sidle, M.S., CCC-SLP 01/13/2023, 10:21 AM                     ECHOCARDIOGRAM COMPLETE  Result Date: 01/11/2023    ECHOCARDIOGRAM REPORT   Patient Name:   Patricia [name] Pascual Date of Exam: 01/11/2023 Medical Rec #:  [id number]  Height:       58.0 in Accession #:    [id number]        Weight:       139.0 lb Date of Birth:  1946-03-26          BSA:          1.560 m Patient Age:    29 years          BP:           77/54 mmHg Patient Gender: F                 HR:           69 bpm. Exam Location:  Inpatient Procedure: 2D Echo, Color Doppler, Cardiac Doppler and Intracardiac            Opacification Agent Indications:    Stroke  History:        Patient has no prior history of Echocardiogram examinations.                 Risk Factors:Dyslipidemia. Lung cancer.  Sonographer:    Eartha Inch Referring Phys: [id number] ASIA B [place]  Sonographer Comments: Technically difficult study due to poor echo windows. Image acquisition challenging due to patient body habitus and Image acquisition challenging due to respiratory motion. IMPRESSIONS  1. Left ventricular ejection fraction, by estimation, is 60 to 65%. The left ventricle has normal function. The left ventricle has no regional wall motion abnormalities. Left ventricular diastolic parameters are consistent with Grade I diastolic dysfunction (impaired relaxation).  2. Right ventricular systolic function is normal. The right ventricular size is normal.  3. No evidence of mitral valve regurgitation.  4. The aortic valve is grossly normal. Aortic valve regurgitation is not visualized.  5. The inferior vena cava is normal in size with greater than 50% respiratory variability, suggesting right atrial  pressure of 3 mmHg. FINDINGS  Left Ventricle: Left ventricular ejection fraction, by estimation, is 60 to 65%. The left ventricle has normal function. The left ventricle has no regional wall motion abnormalities. Definity contrast agent was given IV to delineate the left ventricular  endocardial borders. The left ventricular internal cavity size was normal in size. There is no left ventricular hypertrophy. Left ventricular diastolic parameters are consistent with Grade I diastolic dysfunction (impaired relaxation). Right Ventricle: The right ventricular size is normal. Right ventricular systolic function is normal. Left Atrium: Left atrial size was normal in size. Right Atrium: Right atrial size was normal in size. Pericardium: There is no evidence of pericardial effusion. Mitral Valve: No evidence of mitral valve regurgitation. MV peak gradient, 5.2 mmHg. The mean mitral valve gradient is 1.0 mmHg. Tricuspid Valve: Tricuspid valve regurgitation is not demonstrated. Aortic Valve: The aortic valve is grossly normal. Aortic valve regurgitation is not visualized. Pulmonic Valve: Pulmonic valve regurgitation is not visualized. Aorta: The aortic root and ascending aorta are structurally normal, with no evidence of dilitation. Venous: The inferior vena cava is normal in size with greater than 50% respiratory variability, suggesting right atrial pressure of 3 mmHg. IAS/Shunts: No atrial level shunt detected by color flow Doppler.  LEFT VENTRICLE PLAX 2D LVIDd:         3.30 cm   Diastology LVIDs:         2.20 cm   LV e' medial:    4.57 cm/s LV PW:         0.90 cm   LV E/e' medial:  17.4 LV IVS:  1.00 cm   LV e' lateral:   5.11 cm/s LVOT diam:     1.90 cm   LV E/e' lateral: 15.6 LV SV:         69 LV SV Index:   44 LVOT Area:     2.84 cm  RIGHT VENTRICLE RV S prime:     14.40 cm/s TAPSE (M-mode): 1.6 cm LEFT ATRIUM             Index LA diam:        2.40 cm 1.54 cm/m LA Vol (A2C):   41.2 ml 26.41 ml/m LA Vol (A4C):    35.2 ml 22.56 ml/m LA Biplane Vol: 39.7 ml 25.45 ml/m  AORTIC VALVE LVOT Vmax:   127.00 cm/s LVOT Vmean:  84.500 cm/s LVOT VTI:    0.243 m  AORTA Ao Root diam: 2.90 cm Ao Asc diam:  3.10 cm MITRAL VALVE MV Area (PHT): 2.73 cm     SHUNTS MV Area VTI:   2.06 cm     Systemic VTI:  0.24 m MV Peak grad:  5.2 mmHg     Systemic Diam: 1.90 cm MV Mean grad:  1.0 mmHg MV Vmax:       1.14 m/s MV Vmean:      51.8 cm/s MV Decel Time: 278 msec MV E velocity: 79.70 cm/s MV A velocity: 111.00 cm/s MV E/A ratio:  0.72 Mary Scientist, physiological signed by Phineas Inches Signature Date/Time: 01/11/2023/4:52:53 PM    Final    MR ANGIO HEAD WO CONTRAST  Result Date: 01/11/2023 CLINICAL DATA:  Stroke suspected. History of lung cancer under treatment EXAM: MRI HEAD WITHOUT AND WITH CONTRAST MRA HEAD WITHOUT CONTRAST MRA NECK WITHOUT AND WITH CONTRAST TECHNIQUE: Multiplanar, multiecho pulse sequences of the brain and surrounding structures were obtained without and with intravenous contrast. Angiographic images of the Circle of Willis were obtained using MRA technique without intravenous contrast. Angiographic images of the neck were obtained using MRA technique without and with intravenous contrast. Carotid stenosis measurements (when applicable) are obtained utilizing NASCET criteria, using the distal internal carotid diameter as the denominator. CONTRAST:  6.6m GADAVIST GADOBUTROL 1 MMOL/ML IV SOLN COMPARISON:  Head CT from yesterday FINDINGS: MRI HEAD FINDINGS Brain: Small acute infarcts scattered along the right frontal and occipital cortex, right frontal involvement is parasagittal in the posterior ACA distribution. There is also mild bilateral cerebral white matter involvement. A faint focus of diffusion restriction noted in the left parietal subcortical region. Small focus of enhancement in the posterior right frontal cortex is likely an area of subacute ischemia we will need follow-up. No swelling or definite mass. Normal brain  volume. Mild chronic small vessel ischemia in the cerebral white matter and pons. No hydrocephalus or collection Vascular: Major flow voids and vascular enhancements are preserved Skull and upper cervical spine: No focal marrow lesion. Cervical spine degeneration with C3-4 anterolisthesis. Sinuses/Orbits: Negative Other: Reticulation in the bilateral subcutaneous face attributed to cosmetic injections MRA HEAD FINDINGS No branch occlusion, beading, or flow limiting stenosis. Negative for aneurysm or vascular malformation MRA NECK FINDINGS Antegrade flow in the carotid and vertebral arteries by time-of-flight. Normal arch on postcontrast imaging. When allowing for motion the carotid and vertebral arteries are widely patent and smoothly contoured in the bilateral neck. Tortuosity without beading affects the major arteries diffusely. IMPRESSION: 1. Small scattered acute and subacute infarcts in the bilateral cerebral hemisphere suggesting central embolic disease. Enhancing nodule in the right frontal cortex is most likely a subacute  infarct but recommend follow-up in 6-8 weeks given active malignancy. 2. No underlying stenosis or irregularity of major arteries in the head and neck. Electronically Signed   By: Jorje Guild M.D.   On: 01/11/2023 10:41   MR BRAIN W WO CONTRAST  Result Date: 01/11/2023 CLINICAL DATA:  Stroke suspected. History of lung cancer under treatment EXAM: MRI HEAD WITHOUT AND WITH CONTRAST MRA HEAD WITHOUT CONTRAST MRA NECK WITHOUT AND WITH CONTRAST TECHNIQUE: Multiplanar, multiecho pulse sequences of the brain and surrounding structures were obtained without and with intravenous contrast. Angiographic images of the Circle of Willis were obtained using MRA technique without intravenous contrast. Angiographic images of the neck were obtained using MRA technique without and with intravenous contrast. Carotid stenosis measurements (when applicable) are obtained utilizing NASCET criteria, using  the distal internal carotid diameter as the denominator. CONTRAST:  6.61m GADAVIST GADOBUTROL 1 MMOL/ML IV SOLN COMPARISON:  Head CT from yesterday FINDINGS: MRI HEAD FINDINGS Brain: Small acute infarcts scattered along the right frontal and occipital cortex, right frontal involvement is parasagittal in the posterior ACA distribution. There is also mild bilateral cerebral white matter involvement. A faint focus of diffusion restriction noted in the left parietal subcortical region. Small focus of enhancement in the posterior right frontal cortex is likely an area of subacute ischemia we will need follow-up. No swelling or definite mass. Normal brain volume. Mild chronic small vessel ischemia in the cerebral white matter and pons. No hydrocephalus or collection Vascular: Major flow voids and vascular enhancements are preserved Skull and upper cervical spine: No focal marrow lesion. Cervical spine degeneration with C3-4 anterolisthesis. Sinuses/Orbits: Negative Other: Reticulation in the bilateral subcutaneous face attributed to cosmetic injections MRA HEAD FINDINGS No branch occlusion, beading, or flow limiting stenosis. Negative for aneurysm or vascular malformation MRA NECK FINDINGS Antegrade flow in the carotid and vertebral arteries by time-of-flight. Normal arch on postcontrast imaging. When allowing for motion the carotid and vertebral arteries are widely patent and smoothly contoured in the bilateral neck. Tortuosity without beading affects the major arteries diffusely. IMPRESSION: 1. Small scattered acute and subacute infarcts in the bilateral cerebral hemisphere suggesting central embolic disease. Enhancing nodule in the right frontal cortex is most likely a subacute infarct but recommend follow-up in 6-8 weeks given active malignancy. 2. No underlying stenosis or irregularity of major arteries in the head and neck. Electronically Signed   By: JJorje GuildM.D.   On: 01/11/2023 10:41   MR ANGIO NECK W  WO CONTRAST  Result Date: 01/11/2023 CLINICAL DATA:  Stroke suspected. History of lung cancer under treatment EXAM: MRI HEAD WITHOUT AND WITH CONTRAST MRA HEAD WITHOUT CONTRAST MRA NECK WITHOUT AND WITH CONTRAST TECHNIQUE: Multiplanar, multiecho pulse sequences of the brain and surrounding structures were obtained without and with intravenous contrast. Angiographic images of the Circle of Willis were obtained using MRA technique without intravenous contrast. Angiographic images of the neck were obtained using MRA technique without and with intravenous contrast. Carotid stenosis measurements (when applicable) are obtained utilizing NASCET criteria, using the distal internal carotid diameter as the denominator. CONTRAST:  6.337mGADAVIST GADOBUTROL 1 MMOL/ML IV SOLN COMPARISON:  Head CT from yesterday FINDINGS: MRI HEAD FINDINGS Brain: Small acute infarcts scattered along the right frontal and occipital cortex, right frontal involvement is parasagittal in the posterior ACA distribution. There is also mild bilateral cerebral white matter involvement. A faint focus of diffusion restriction noted in the left parietal subcortical region. Small focus of enhancement in the posterior right frontal cortex  is likely an area of subacute ischemia we will need follow-up. No swelling or definite mass. Normal brain volume. Mild chronic small vessel ischemia in the cerebral white matter and pons. No hydrocephalus or collection Vascular: Major flow voids and vascular enhancements are preserved Skull and upper cervical spine: No focal marrow lesion. Cervical spine degeneration with C3-4 anterolisthesis. Sinuses/Orbits: Negative Other: Reticulation in the bilateral subcutaneous face attributed to cosmetic injections MRA HEAD FINDINGS No branch occlusion, beading, or flow limiting stenosis. Negative for aneurysm or vascular malformation MRA NECK FINDINGS Antegrade flow in the carotid and vertebral arteries by time-of-flight. Normal arch  on postcontrast imaging. When allowing for motion the carotid and vertebral arteries are widely patent and smoothly contoured in the bilateral neck. Tortuosity without beading affects the major arteries diffusely. IMPRESSION: 1. Small scattered acute and subacute infarcts in the bilateral cerebral hemisphere suggesting central embolic disease. Enhancing nodule in the right frontal cortex is most likely a subacute infarct but recommend follow-up in 6-8 weeks given active malignancy. 2. No underlying stenosis or irregularity of major arteries in the head and neck. Electronically Signed   By: Jorje Guild M.D.   On: 01/11/2023 10:41   CT HEAD WO CONTRAST  Result Date: 01/10/2023 CLINICAL DATA:  Sudden left leg weakness. Neuro deficit, acute, stroke suspected. EXAM: CT HEAD WITHOUT CONTRAST TECHNIQUE: Contiguous axial images were obtained from the base of the skull through the vertex without intravenous contrast. RADIATION DOSE REDUCTION: This exam was performed according to the departmental dose-optimization program which includes automated exposure control, adjustment of the mA and/or kV according to patient size and/or use of iterative reconstruction technique. COMPARISON:  None Available. FINDINGS: Brain: No acute intracranial hemorrhage, midline shift or mass effect. No extra-axial fluid collection. Mild atrophy is noted. Mild periventricular white matter hypodensities are present bilaterally. No hydrocephalus. Vascular: No hyperdense vessel or unexpected calcification. Skull: Normal. Negative for fracture or focal lesion. Sinuses/Orbits: No acute finding. Other: None. IMPRESSION: 1. No acute intracranial process. 2. Atrophy with chronic microvascular ischemic changes. Electronically Signed   By: Brett Fairy M.D.   On: 01/10/2023 20:04     ASSESSMENT/PLAN:  This is a very pleasant 77 year old Caucasian female diagnosed with stage IV (T1c, N3, M1 C) non-small cell lung cancer, adenocarcinoma.  The  patient presented with a left upper lobe metastatic neoplasm in addition to extensive lymphadenopathy involving the neck, left hilar, mediastinal, and upper abdominal lymph nodes with multiple subcutaneous/muscular nodules.  She was diagnosed in January 2024.  Her molecular studies show she is positive for K-ras G12 C which can be used in the second line setting.  Her PD-L1 expression is 99%    The patient is going to start systemic/palliative chemotherapy and immunotherapy with carboplatin for AUC of 5, Alimta 500 mg/m, Keytruda 200 mg IV every 3 weeks.  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  She is feeling ***at this time.  The patient was seen with Dr. Julien Nordmann today.  Dr. Julien Nordmann a lengthy discussion with the patient today about her current condition and recommended treatment options?  Dr. Julien Nordmann recommends continuing versus single agent immunotherapy? **  Follow-up  Continue Eliquis?  Tpomax headaches   PT and OT?  cough  The patient was advised to call immediately if she has any concerning symptoms in the interval. The patient voices understanding of current disease status and treatment options and is in agreement with the  current care plan. All questions were answered. The patient knows to call the clinic with any problems, questions or concerns. We can certainly see the patient much sooner if necessary    No orders of the defined types were placed in this encounter.    I spent {CHL ONC TIME VISIT - [id number] counseling the patient face to face. The total time spent in the appointment was {CHL ONC TIME VISIT - [id number].  Addie Cederberg L Skylyn Slezak, PA-C 02/09/23

## 2023-02-10 ENCOUNTER — Ambulatory Visit: Payer: Medicare HMO

## 2023-02-10 ENCOUNTER — Other Ambulatory Visit: Payer: Medicare HMO

## 2023-02-10 ENCOUNTER — Ambulatory Visit: Payer: Medicare HMO | Admitting: Internal Medicine

## 2023-02-10 MED FILL — Fosaprepitant Dimeglumine For IV Infusion 150 MG (Base Eq): INTRAVENOUS | Qty: 5 | Status: AC

## 2023-02-10 MED FILL — Dexamethasone Sodium Phosphate Inj 100 MG/10ML: INTRAMUSCULAR | Qty: 1 | Status: AC

## 2023-02-11 ENCOUNTER — Inpatient Hospital Stay (HOSPITAL_BASED_OUTPATIENT_CLINIC_OR_DEPARTMENT_OTHER): Payer: Medicare HMO | Admitting: Physician Assistant

## 2023-02-11 ENCOUNTER — Inpatient Hospital Stay: Payer: Medicare HMO

## 2023-02-11 ENCOUNTER — Other Ambulatory Visit: Payer: Self-pay

## 2023-02-11 ENCOUNTER — Inpatient Hospital Stay: Payer: Medicare HMO | Attending: Internal Medicine

## 2023-02-11 VITALS — BP 147/86 | HR 73 | Temp 98.0°F | Resp 17 | Ht <= 58 in

## 2023-02-11 DIAGNOSIS — Z5112 Encounter for antineoplastic immunotherapy: Secondary | ICD-10-CM | POA: Diagnosis not present

## 2023-02-11 DIAGNOSIS — Z90710 Acquired absence of both cervix and uterus: Secondary | ICD-10-CM | POA: Insufficient documentation

## 2023-02-11 DIAGNOSIS — Z79899 Other long term (current) drug therapy: Secondary | ICD-10-CM | POA: Diagnosis not present

## 2023-02-11 DIAGNOSIS — C3412 Malignant neoplasm of upper lobe, left bronchus or lung: Secondary | ICD-10-CM | POA: Diagnosis not present

## 2023-02-11 DIAGNOSIS — Z8673 Personal history of transient ischemic attack (TIA), and cerebral infarction without residual deficits: Secondary | ICD-10-CM | POA: Insufficient documentation

## 2023-02-11 DIAGNOSIS — C778 Secondary and unspecified malignant neoplasm of lymph nodes of multiple regions: Secondary | ICD-10-CM | POA: Diagnosis not present

## 2023-02-11 DIAGNOSIS — Z86718 Personal history of other venous thrombosis and embolism: Secondary | ICD-10-CM | POA: Insufficient documentation

## 2023-02-11 DIAGNOSIS — C3492 Malignant neoplasm of unspecified part of left bronchus or lung: Secondary | ICD-10-CM

## 2023-02-11 DIAGNOSIS — Z7901 Long term (current) use of anticoagulants: Secondary | ICD-10-CM | POA: Insufficient documentation

## 2023-02-11 LAB — CBC WITH DIFFERENTIAL (CANCER CENTER ONLY)
Abs Immature Granulocytes: 0.02 10*3/uL (ref 0.00–0.07)
Basophils Absolute: 0.2 10*3/uL — ABNORMAL HIGH (ref 0.0–0.1)
Basophils Relative: 2 %
Eosinophils Absolute: 0.2 10*3/uL (ref 0.0–0.5)
Eosinophils Relative: 3 %
HCT: 40 % (ref 36.0–46.0)
Hemoglobin: 13.5 g/dL (ref 12.0–15.0)
Immature Granulocytes: 0 %
Lymphocytes Relative: 32 %
Lymphs Abs: 2.2 10*3/uL (ref 0.7–4.0)
MCH: 29.5 pg (ref 26.0–34.0)
MCHC: 33.8 g/dL (ref 30.0–36.0)
MCV: 87.3 fL (ref 80.0–100.0)
Monocytes Absolute: 0.5 10*3/uL (ref 0.1–1.0)
Monocytes Relative: 8 %
Neutro Abs: 3.7 10*3/uL (ref 1.7–7.7)
Neutrophils Relative %: 55 %
Platelet Count: 334 10*3/uL (ref 150–400)
RBC: 4.58 MIL/uL (ref 3.87–5.11)
RDW: 15.2 % (ref 11.5–15.5)
WBC Count: 6.8 10*3/uL (ref 4.0–10.5)
nRBC: 0 % (ref 0.0–0.2)

## 2023-02-11 LAB — CMP (CANCER CENTER ONLY)
ALT: 22 U/L (ref 0–44)
AST: 28 U/L (ref 15–41)
Albumin: 4.3 g/dL (ref 3.5–5.0)
Alkaline Phosphatase: 92 U/L (ref 38–126)
Anion gap: 8 (ref 5–15)
BUN: 19 mg/dL (ref 8–23)
CO2: 26 mmol/L (ref 22–32)
Calcium: 10.1 mg/dL (ref 8.9–10.3)
Chloride: 106 mmol/L (ref 98–111)
Creatinine: 0.87 mg/dL (ref 0.44–1.00)
GFR, Estimated: >60 mL/min (ref >60)
Glucose, Bld: 113 mg/dL — ABNORMAL HIGH (ref 70–99)
Potassium: 3.6 mmol/L (ref 3.5–5.1)
Sodium: 140 mmol/L (ref 135–145)
Total Bilirubin: 0.3 mg/dL (ref 0.3–1.2)
Total Protein: 7.2 g/dL (ref 6.5–8.1)

## 2023-02-11 LAB — TSH: TSH: 0.824 u[IU]/mL (ref 0.350–4.500)

## 2023-02-11 MED ORDER — BENZONATATE 100 MG PO CAPS: 100.0000 mg | ORAL_CAPSULE | Freq: Three times a day (TID) | ORAL | 2 refills | Status: DC

## 2023-02-11 MED ORDER — ONDANSETRON HCL 8 MG PO TABS: 8.0000 mg | ORAL_TABLET | Freq: Three times a day (TID) | ORAL | 2 refills | Status: DC | PRN

## 2023-02-12 ENCOUNTER — Ambulatory Visit (INDEPENDENT_AMBULATORY_CARE_PROVIDER_SITE_OTHER): Payer: Medicare HMO | Admitting: Family Medicine

## 2023-02-12 ENCOUNTER — Other Ambulatory Visit: Payer: Self-pay

## 2023-02-12 VITALS — BP 124/78 | HR 70 | Wt 123.4 lb

## 2023-02-12 DIAGNOSIS — F321 Major depressive disorder, single episode, moderate: Secondary | ICD-10-CM

## 2023-02-12 DIAGNOSIS — R9389 Abnormal findings on diagnostic imaging of other specified body structures: Secondary | ICD-10-CM | POA: Diagnosis not present

## 2023-02-12 DIAGNOSIS — K219 Gastro-esophageal reflux disease without esophagitis: Secondary | ICD-10-CM

## 2023-02-12 DIAGNOSIS — I824Y3 Acute embolism and thrombosis of unspecified deep veins of proximal lower extremity, bilateral: Secondary | ICD-10-CM

## 2023-02-12 DIAGNOSIS — R69 Illness, unspecified: Secondary | ICD-10-CM | POA: Diagnosis not present

## 2023-02-12 DIAGNOSIS — R519 Headache, unspecified: Secondary | ICD-10-CM | POA: Diagnosis not present

## 2023-02-12 DIAGNOSIS — I639 Cerebral infarction, unspecified: Secondary | ICD-10-CM | POA: Diagnosis not present

## 2023-02-12 DIAGNOSIS — I10 Essential (primary) hypertension: Secondary | ICD-10-CM | POA: Diagnosis not present

## 2023-02-12 DIAGNOSIS — E782 Mixed hyperlipidemia: Secondary | ICD-10-CM | POA: Diagnosis not present

## 2023-02-12 DIAGNOSIS — G4700 Insomnia, unspecified: Secondary | ICD-10-CM | POA: Diagnosis not present

## 2023-02-12 MED ORDER — ROSUVASTATIN CALCIUM 20 MG PO TABS: 20.0000 mg | ORAL_TABLET | Freq: Every day | ORAL | 6 refills | Status: DC

## 2023-02-12 MED ORDER — FAMOTIDINE 20 MG PO TABS: 20.0000 mg | ORAL_TABLET | Freq: Two times a day (BID) | ORAL | 6 refills | Status: DC

## 2023-02-12 MED ORDER — APIXABAN 5 MG PO TABS: 5.0000 mg | ORAL_TABLET | Freq: Two times a day (BID) | ORAL | 6 refills | Status: DC

## 2023-02-12 MED ORDER — LANSOPRAZOLE 30 MG PO CPDR: DELAYED_RELEASE_CAPSULE | ORAL | 1 refills | Status: DC

## 2023-02-12 MED ORDER — ALPRAZOLAM 0.5 MG PO TABS: 0.5000 mg | ORAL_TABLET | Freq: Every evening | ORAL | 5 refills | Status: DC | PRN

## 2023-02-12 MED ORDER — TOPIRAMATE 100 MG PO TABS: 100.0000 mg | ORAL_TABLET | Freq: Two times a day (BID) | ORAL | 6 refills | Status: DC

## 2023-02-12 MED ORDER — BUTALBITAL-APAP-CAFFEINE 50-325-40 MG PO TABS: 0.5000 | ORAL_TABLET | Freq: Four times a day (QID) | ORAL | 2 refills | Status: DC | PRN

## 2023-02-12 MED ORDER — POTASSIUM CHLORIDE CRYS ER 20 MEQ PO TBCR: 20.0000 meq | EXTENDED_RELEASE_TABLET | Freq: Three times a day (TID) | ORAL | 1 refills | Status: DC

## 2023-02-12 MED ORDER — HYDROCHLOROTHIAZIDE 25 MG PO TABS: 25.0000 mg | ORAL_TABLET | Freq: Every day | ORAL | 6 refills | Status: DC

## 2023-02-12 NOTE — Progress Notes (Signed)
   Subjective:    Patient ID: Patricia [name], female    DOB: [birth year], 77 y.o.   MRN: [id number]  HPI Patient arrives today for follow up hospital visit.  Very difficult situation for the patient Going through stage IV treatment for lung cancer Has done chemotherapy Will be shifting over to newer treatment Patient denies any high fever chills or sweats Recently was in the hospital for what appeared to be embolic stroke along with DVT Acute deep vein thrombosis (DVT) of proximal vein of both lower extremities (HCC)  Insomnia, unspecified type  Depression, major, single episode, moderate (HCC)  Frequent headaches  Gastroesophageal reflux disease, unspecified whether esophagitis present  Mixed hyperlipidemia  Primary hypertension  Patient with weakness associated with stroke Patient with embolic stroke Patient on Eliquis No sign of bleeding issues   Review of Systems     Objective:   Physical Exam General-in no acute distress Eyes-no discharge Lungs-respiratory rate normal, CTA CV-no murmurs,RRR Extremities skin warm dry no edema Neuro grossly normal Behavior normal, alert Patient whispers because of unilateral vocal cord paralysis  Patient with left-sided weakness associated with the stroke     Assessment & Plan:  Very complex patient Going through multiple treatments Granddaughter here with her currently 1. Acute deep vein thrombosis (DVT) of proximal vein of both lower extremities (HCC) Continue Eliquis for at least 6 months and perhaps lifelong this will be up to oncology  2. Insomnia, unspecified type Xanax at nighttime although she relates she is not sleeping well but she goes to lay down around 6 PM and then wakes up around 3:00 I have encouraged her to go to bed later and she would wake up closer to 6 AM  3. Depression, major, single episode, moderate ([place]) Continue antidepressant patient managing the best she can through a difficult situation  4.  Frequent headaches She has had frequent headaches ever since having her scan/stroke.  Abnormal MRI.  Recommended to do a follow-up MRI imaging within 6 to 8 weeks.  I reviewed over the MRI imaging.  Radiologist recommends a follow-up with between 6 and 8 weeks.  That would be between March 16 and March 30.  Because of her lung cancer they cannot exclude one of the spots being a metastatic disease.  It is imperative that she gets MRI with and without contrast during this span.  We will make sure our staff pursues getting this test completed We will help set this up for late March She currently is taking Fioricet on a regular basis which I am not thrilled with but she states that half tablet is the only thing that helps her with her headaches.  I talked with her about rebound headaches.  Granddaughter states neurologist told her to keep taking it as needed for the headache.  They have an appointment coming up with neurology in April hopefully this can be addressed at that time 5. Gastroesophageal reflux disease, unspecified whether esophagitis present PPI continue healthy diet  6. Mixed hyperlipidemia Statin continue healthy diet  7. Primary hypertension Blood pressure good control continue current measures Recent labs reviewed Follow-up in 4 months sooner if any problems or issues

## 2023-02-13 ENCOUNTER — Telehealth: Payer: Self-pay | Admitting: *Deleted

## 2023-02-13 LAB — T4: T4, Total: 9.9 ug/dL (ref 4.5–12.0)

## 2023-02-13 NOTE — Telephone Encounter (Signed)
Will OT called for POC 2wk2, 1wk1. Approval given.

## 2023-02-14 ENCOUNTER — Ambulatory Visit: Payer: Medicare HMO

## 2023-02-14 ENCOUNTER — Ambulatory Visit: Payer: Medicare HMO | Admitting: Physician Assistant

## 2023-02-14 ENCOUNTER — Other Ambulatory Visit: Payer: Medicare HMO

## 2023-02-17 MED FILL — Dexamethasone Sodium Phosphate Inj 100 MG/10ML: INTRAMUSCULAR | Qty: 1 | Status: AC

## 2023-02-17 MED FILL — Fosaprepitant Dimeglumine For IV Infusion 150 MG (Base Eq): INTRAVENOUS | Qty: 5 | Status: AC

## 2023-02-18 ENCOUNTER — Inpatient Hospital Stay: Payer: Medicare HMO

## 2023-02-18 ENCOUNTER — Other Ambulatory Visit: Payer: Self-pay

## 2023-02-18 VITALS — BP 139/73 | HR 63 | Temp 97.7°F | Resp 18 | Wt 123.8 lb

## 2023-02-18 DIAGNOSIS — C778 Secondary and unspecified malignant neoplasm of lymph nodes of multiple regions: Secondary | ICD-10-CM | POA: Diagnosis not present

## 2023-02-18 DIAGNOSIS — Z79899 Other long term (current) drug therapy: Secondary | ICD-10-CM | POA: Diagnosis not present

## 2023-02-18 DIAGNOSIS — C3492 Malignant neoplasm of unspecified part of left bronchus or lung: Secondary | ICD-10-CM

## 2023-02-18 DIAGNOSIS — Z7901 Long term (current) use of anticoagulants: Secondary | ICD-10-CM | POA: Diagnosis not present

## 2023-02-18 DIAGNOSIS — Z8673 Personal history of transient ischemic attack (TIA), and cerebral infarction without residual deficits: Secondary | ICD-10-CM | POA: Diagnosis not present

## 2023-02-18 DIAGNOSIS — Z86718 Personal history of other venous thrombosis and embolism: Secondary | ICD-10-CM | POA: Diagnosis not present

## 2023-02-18 DIAGNOSIS — Z90710 Acquired absence of both cervix and uterus: Secondary | ICD-10-CM | POA: Diagnosis not present

## 2023-02-18 DIAGNOSIS — C3412 Malignant neoplasm of upper lobe, left bronchus or lung: Secondary | ICD-10-CM | POA: Diagnosis not present

## 2023-02-18 DIAGNOSIS — Z5112 Encounter for antineoplastic immunotherapy: Secondary | ICD-10-CM | POA: Diagnosis not present

## 2023-02-18 LAB — CMP (CANCER CENTER ONLY)
ALT: 15 U/L (ref 0–44)
AST: 21 U/L (ref 15–41)
Albumin: 4 g/dL (ref 3.5–5.0)
Alkaline Phosphatase: 82 U/L (ref 38–126)
Anion gap: 6 (ref 5–15)
BUN: 24 mg/dL — ABNORMAL HIGH (ref 8–23)
CO2: 25 mmol/L (ref 22–32)
Calcium: 10 mg/dL (ref 8.9–10.3)
Chloride: 111 mmol/L (ref 98–111)
Creatinine: 0.73 mg/dL (ref 0.44–1.00)
GFR, Estimated: >60 mL/min (ref >60)
Glucose, Bld: 101 mg/dL — ABNORMAL HIGH (ref 70–99)
Potassium: 4.1 mmol/L (ref 3.5–5.1)
Sodium: 142 mmol/L (ref 135–145)
Total Bilirubin: 0.3 mg/dL (ref 0.3–1.2)
Total Protein: 6.7 g/dL (ref 6.5–8.1)

## 2023-02-18 LAB — CBC WITH DIFFERENTIAL (CANCER CENTER ONLY)
Abs Immature Granulocytes: 0.01 10*3/uL (ref 0.00–0.07)
Basophils Absolute: 0.1 10*3/uL (ref 0.0–0.1)
Basophils Relative: 1 %
Eosinophils Absolute: 0.2 10*3/uL (ref 0.0–0.5)
Eosinophils Relative: 3 %
HCT: 39.7 % (ref 36.0–46.0)
Hemoglobin: 13.2 g/dL (ref 12.0–15.0)
Immature Granulocytes: 0 %
Lymphocytes Relative: 37 %
Lymphs Abs: 2.4 10*3/uL (ref 0.7–4.0)
MCH: 29.5 pg (ref 26.0–34.0)
MCHC: 33.2 g/dL (ref 30.0–36.0)
MCV: 88.6 fL (ref 80.0–100.0)
Monocytes Absolute: 0.5 10*3/uL (ref 0.1–1.0)
Monocytes Relative: 7 %
Neutro Abs: 3.4 10*3/uL (ref 1.7–7.7)
Neutrophils Relative %: 52 %
Platelet Count: 230 10*3/uL (ref 150–400)
RBC: 4.48 MIL/uL (ref 3.87–5.11)
RDW: 15.1 % (ref 11.5–15.5)
WBC Count: 6.6 10*3/uL (ref 4.0–10.5)
nRBC: 0 % (ref 0.0–0.2)

## 2023-02-18 MED ORDER — SODIUM CHLORIDE 0.9 % IV SOLN
10.0000 mg | Freq: Once | INTRAVENOUS | Status: DC
  Filled 2023-02-18 (×2): qty 1

## 2023-02-18 MED ORDER — SODIUM CHLORIDE 0.9 % IV SOLN
150.0000 mg | Freq: Once | INTRAVENOUS | Status: DC
  Filled 2023-02-18 (×2): qty 5

## 2023-02-18 MED ORDER — PALONOSETRON HCL INJECTION 0.25 MG/5ML: 0.2500 mg | Freq: Once | INTRAVENOUS | Status: DC

## 2023-02-18 MED ORDER — SODIUM CHLORIDE 0.9 % IV SOLN
200.0000 mg | Freq: Once | INTRAVENOUS | Status: AC
  Administered 2023-02-18: 200 mg via INTRAVENOUS
  Filled 2023-02-18: qty 8

## 2023-02-18 MED ORDER — SODIUM CHLORIDE 0.9 % IV SOLN: Freq: Once | INTRAVENOUS | Status: AC

## 2023-02-18 NOTE — Addendum Note (Signed)
Addended by: Dairl Ponder on: 02/18/2023 09:37 AM   Modules accepted: Orders

## 2023-02-18 NOTE — Patient Instructions (Signed)
[facility]  Discharge Instructions: Thank you for choosing [facility] to provide your oncology and hematology care.   If you have a lab appointment with the [facility], please go directly to the [facility] and check in at the registration area.   Wear comfortable clothing and clothing appropriate for easy access to any Portacath or PICC line.   We strive to give you quality time with your provider. You may need to reschedule your appointment if you arrive late (15 or more minutes).  Arriving late affects you and other patients whose appointments are after yours.  Also, if you miss three or more appointments without notifying the office, you may be dismissed from the clinic at the provider's discretion.      For prescription refill requests, have your pharmacy contact our office and allow 72 hours for refills to be completed.    Today you received the following chemotherapy and/or immunotherapy agents Keytruda      To help prevent nausea and vomiting after your treatment, we encourage you to take your nausea medication as directed.  BELOW ARE SYMPTOMS THAT SHOULD BE REPORTED IMMEDIATELY: *FEVER GREATER THAN 100.4 F (38 C) OR HIGHER *CHILLS OR SWEATING *NAUSEA AND VOMITING THAT IS NOT CONTROLLED WITH YOUR NAUSEA MEDICATION *UNUSUAL SHORTNESS OF BREATH *UNUSUAL BRUISING OR BLEEDING *URINARY PROBLEMS (pain or burning when urinating, or frequent urination) *BOWEL PROBLEMS (unusual diarrhea, constipation, pain near the anus) TENDERNESS IN MOUTH AND THROAT WITH OR WITHOUT PRESENCE OF ULCERS (sore throat, sores in mouth, or a toothache) UNUSUAL RASH, SWELLING OR PAIN  UNUSUAL VAGINAL DISCHARGE OR ITCHING   Items with * indicate a potential emergency and should be followed up as soon as possible or go to the Emergency Department if any problems should occur.  Please show the CHEMOTHERAPY ALERT CARD or IMMUNOTHERAPY ALERT CARD at  check-in to the Emergency Department and triage nurse.  Should you have questions after your visit or need to cancel or reschedule your appointment, please contact [facility]  Dept: [phone]  and follow the prompts.  Office hours are 8:00 a.m. to 4:30 p.m. Monday - Friday. Please note that voicemails left after 4:00 p.m. may not be returned until the following business day.  We are closed weekends and major holidays. You have access to a nurse at all times for urgent questions. Please call the main number to the clinic Dept: [phone] and follow the prompts.   For any non-urgent questions, you may also contact your provider using MyChart. We now offer e-Visits for anyone 9 and older to request care online for non-urgent symptoms. For details visit [email or web address].   Also download the MyChart app! Go to the app store, search "MyChart", open the app, select [facility], and log in with your MyChart username and password. Pembrolizumab Injection What is this medication? PEMBROLIZUMAB (PEM broe LIZ ue mab) treats some types of cancer. It works by helping your immune system slow or stop the spread of cancer cells. It is a monoclonal antibody. This medicine may be used for other purposes; ask your health care provider or pharmacist if you have questions. COMMON BRAND NAME(S): Keytruda What should I tell my care team before I take this medication? They need to know if you have any of these conditions: Allogeneic stem cell transplant (uses someone else's stem cells) Autoimmune diseases, such as Crohn disease, ulcerative colitis, lupus History of chest radiation Nervous system  problems, such as Guillain-Barre syndrome, myasthenia gravis Organ transplant An unusual or allergic reaction to pembrolizumab, other medications, foods, dyes, or preservatives Pregnant or trying to get pregnant Breast-feeding How should I use this medication? This medication  is injected into a vein. It is given by your care team in a hospital or clinic setting. A special MedGuide will be given to you before each treatment. Be sure to read this information carefully each time. Talk to your care team about the use of this medication in children. While it may be prescribed for children as young as 6 months for selected conditions, precautions do apply. Overdosage: If you think you have taken too much of this medicine contact a poison control center or emergency room at once. NOTE: This medicine is only for you. Do not share this medicine with others. What if I miss a dose? Keep appointments for follow-up doses. It is important not to miss your dose. Call your care team if you are unable to keep an appointment. What may interact with this medication? Interactions have not been studied. This list may not describe all possible interactions. Give your health care provider a list of all the medicines, herbs, non-prescription drugs, or dietary supplements you use. Also tell them if you smoke, drink alcohol, or use illegal drugs. Some items may interact with your medicine. What should I watch for while using this medication? Your condition will be monitored carefully while you are receiving this medication. You may need blood work while taking this medication. This medication may cause serious skin reactions. They can happen weeks to months after starting the medication. Contact your care team right away if you notice fevers or flu-like symptoms with a rash. The rash may be red or purple and then turn into blisters or peeling of the skin. You may also notice a red rash with swelling of the face, lips, or lymph nodes in your neck or under your arms. Tell your care team right away if you have any change in your eyesight. Talk to your care team if you may be pregnant. Serious birth defects can occur if you take this medication during pregnancy and for 4 months after the last dose. You  will need a negative pregnancy test before starting this medication. Contraception is recommended while taking this medication and for 4 months after the last dose. Your care team can help you find the option that works for you. Do not breastfeed while taking this medication and for 4 months after the last dose. What side effects may I notice from receiving this medication? Side effects that you should report to your care team as soon as possible: Allergic reactions--skin rash, itching, hives, swelling of the face, lips, tongue, or throat Dry cough, shortness of breath or trouble breathing Eye pain, redness, irritation, or discharge with blurry or decreased vision Heart muscle inflammation--unusual weakness or fatigue, shortness of breath, chest pain, fast or irregular heartbeat, dizziness, swelling of the ankles, feet, or hands Hormone gland problems--headache, sensitivity to light, unusual weakness or fatigue, dizziness, fast or irregular heartbeat, increased sensitivity to cold or heat, excessive sweating, constipation, hair loss, increased thirst or amount of urine, tremors or shaking, irritability Infusion reactions--chest pain, shortness of breath or trouble breathing, feeling faint or lightheaded Kidney injury (glomerulonephritis)--decrease in the amount of urine, red or dark brown urine, foamy or bubbly urine, swelling of the ankles, hands, or feet Liver injury--right upper belly pain, loss of appetite, nausea, light-colored stool, dark yellow or brown  urine, yellowing skin or eyes, unusual weakness or fatigue Pain, tingling, or numbness in the hands or feet, muscle weakness, change in vision, confusion or trouble speaking, loss of balance or coordination, trouble walking, seizures Rash, fever, and swollen lymph nodes Redness, blistering, peeling, or loosening of the skin, including inside the mouth Sudden or severe stomach pain, bloody diarrhea, fever, nausea, vomiting Side effects that  usually do not require medical attention (report to your care team if they continue or are bothersome): Bone, joint, or muscle pain Diarrhea Fatigue Loss of appetite Nausea Skin rash This list may not describe all possible side effects. Call your doctor for medical advice about side effects. You may report side effects to FDA at 1-800-FDA-1088. Where should I keep my medication? This medication is given in a hospital or clinic. It will not be stored at home. NOTE: This sheet is a summary. It may not cover all possible information. If you have questions about this medicine, talk to your doctor, pharmacist, or health care provider.  2023 Elsevier/Gold Standard (2022-04-09 00:00:00)

## 2023-02-19 ENCOUNTER — Encounter: Payer: Self-pay | Admitting: Internal Medicine

## 2023-02-21 ENCOUNTER — Encounter: Payer: Self-pay | Admitting: Internal Medicine

## 2023-02-24 ENCOUNTER — Inpatient Hospital Stay: Payer: Medicare HMO

## 2023-02-24 ENCOUNTER — Encounter: Payer: Self-pay | Admitting: Medical Oncology

## 2023-02-24 NOTE — Progress Notes (Signed)
Nutrition  Called for scheduled telephone nutrition appointment.  No answer. Left message with call back number.    Sent message to scheduling to reschedule patient appointment.   Lenoir Facchini B. Zenia Resides, [place], [place] Registered Dietitian [phone]

## 2023-02-26 ENCOUNTER — Inpatient Hospital Stay: Payer: Medicare HMO | Admitting: Dietician

## 2023-02-26 ENCOUNTER — Telehealth: Payer: Self-pay | Admitting: Dietician

## 2023-02-26 NOTE — Telephone Encounter (Signed)
Brief Nutrition Note  Nutrition appointment completed via telephone with [name] (granddaughter of pt). She reports patient is doing well. Patient has a good appetite and eating better. Heather reports nutrition appointment was made due to wt loss s/p multiple hospitalizations. Patient tolerated first Keytruda. Denies nutrition impact symptoms.   [name] does express concern about patients memory. She states that patient is forgetful in the morning. Often does not recall if she has eaten breakfast or taken medications. Patient is scheduled for MRI of brain 3/22. Noted patient to follow-up with neurologist in April.   Will plan to see patient during infusion on Tuesday April 2. Granddaughter agreeable to this and appreciative of call.

## 2023-02-27 ENCOUNTER — Telehealth: Payer: Self-pay | Admitting: Medical Oncology

## 2023-02-27 NOTE — Telephone Encounter (Signed)
Pt has runny nose and headache.  I told [name] to do a Home Covid test on her Carli and call with results and she can take  OTC meds for these symptoms .  I asked her to call COVID test results.

## 2023-02-28 ENCOUNTER — Telehealth: Payer: Self-pay

## 2023-02-28 ENCOUNTER — Ambulatory Visit (HOSPITAL_COMMUNITY)
Admission: RE | Admit: 2023-02-28 | Discharge: 2023-02-28 | Disposition: A | Discharge status: Home / Self Care | Payer: Medicare HMO | Source: Ambulatory Visit | Attending: Family Medicine | Admitting: Family Medicine

## 2023-02-28 DIAGNOSIS — R9389 Abnormal findings on diagnostic imaging of other specified body structures: Secondary | ICD-10-CM | POA: Diagnosis not present

## 2023-02-28 DIAGNOSIS — R519 Headache, unspecified: Secondary | ICD-10-CM | POA: Insufficient documentation

## 2023-02-28 DIAGNOSIS — G9389 Other specified disorders of brain: Secondary | ICD-10-CM | POA: Diagnosis not present

## 2023-02-28 MED ORDER — GADOBUTROL 1 MMOL/ML IV SOLN
5.5000 mL | Freq: Once | INTRAVENOUS | Status: AC | PRN
  Administered 2023-02-28: 5.5 mL via INTRAVENOUS

## 2023-02-28 NOTE — Telephone Encounter (Signed)
This nurse received a message from this patients granddaughter stating that patient has tested negative for Covid and will be able to have MRI completed on today.  No further questions or concerns noted.

## 2023-03-03 ENCOUNTER — Inpatient Hospital Stay: Payer: Medicare HMO

## 2023-03-03 ENCOUNTER — Encounter: Payer: Self-pay | Admitting: Internal Medicine

## 2023-03-03 ENCOUNTER — Inpatient Hospital Stay (HOSPITAL_BASED_OUTPATIENT_CLINIC_OR_DEPARTMENT_OTHER): Payer: Medicare HMO | Admitting: Internal Medicine

## 2023-03-03 ENCOUNTER — Other Ambulatory Visit: Payer: Self-pay

## 2023-03-03 DIAGNOSIS — C3492 Malignant neoplasm of unspecified part of left bronchus or lung: Secondary | ICD-10-CM

## 2023-03-03 DIAGNOSIS — C778 Secondary and unspecified malignant neoplasm of lymph nodes of multiple regions: Secondary | ICD-10-CM | POA: Diagnosis not present

## 2023-03-03 DIAGNOSIS — Z79899 Other long term (current) drug therapy: Secondary | ICD-10-CM | POA: Diagnosis not present

## 2023-03-03 DIAGNOSIS — Z86718 Personal history of other venous thrombosis and embolism: Secondary | ICD-10-CM | POA: Diagnosis not present

## 2023-03-03 DIAGNOSIS — Z7901 Long term (current) use of anticoagulants: Secondary | ICD-10-CM | POA: Diagnosis not present

## 2023-03-03 DIAGNOSIS — Z8673 Personal history of transient ischemic attack (TIA), and cerebral infarction without residual deficits: Secondary | ICD-10-CM | POA: Diagnosis not present

## 2023-03-03 DIAGNOSIS — Z5112 Encounter for antineoplastic immunotherapy: Secondary | ICD-10-CM | POA: Diagnosis not present

## 2023-03-03 DIAGNOSIS — C3412 Malignant neoplasm of upper lobe, left bronchus or lung: Secondary | ICD-10-CM | POA: Diagnosis not present

## 2023-03-03 DIAGNOSIS — Z90710 Acquired absence of both cervix and uterus: Secondary | ICD-10-CM | POA: Diagnosis not present

## 2023-03-03 NOTE — Progress Notes (Signed)
[facility] [phone]   Fax:([phone]  OFFICE PROGRESS NOTE  Patricia Drown, MD [place] [address]  DIAGNOSIS: Stage IV (T1c, N3, M1 C) non-small cell lung cancer, adenocarcinoma presented with left upper lobe metastatic neoplasm presented with left lower lobe lung nodule in addition to extensive lymphadenopathy involving the neck, left hilar and mediastinal lymphadenopathy as well as upper abdomen lymph nodes and multiple subcutaneous/muscular nodules diagnosed in January 2024.  Molecular studies by foundation 1 showed positive KRAS G12C mutation and PD-L1 expression 99%  PRIOR THERAPY: None   CURRENT THERAPY: Systemic chemotherapy with carboplatin for AUC of 5, Alimta 500 Mg/M2 and Keytruda 200 Mg IV every 3 weeks.  First dose is planned for December 30, 2022. Starting from cycle #2 on 02/18/23 she will be on single agent Keytruda 200 mg IV every 3 weeks due to her recent stroke and physical debility.  Status post 2 cycles.  INTERVAL HISTORY: Patricia [name] 77 y.o. female returns to the clinic today for follow-up visit accompanied by her grandson.  The patient is feeling fine today with no concerning complaints.  She tolerated the second cycle of her treatment with single agent immunotherapy fairly well.  She denied having any chest pain, shortness of breath, cough or hemoptysis.  She has no nausea, vomiting, diarrhea or constipation.  She has no headache or visual changes.  She denied having any recent weight loss or night sweats.  She had MRI of the brain by neurology but the results are still pending.  She is here today for evaluation and management of any adverse effect of her treatment.  MEDICAL HISTORY: Past Medical History:  Diagnosis Date   Depression    GERD (gastroesophageal reflux disease)    Hypercholesterolemia    IFG (impaired fasting glucose)    Nonintractable episodic headache 01/18/2023   Osteopenia     PONV (postoperative nausea and vomiting)     ALLERGIES:  is allergic to codeine.  MEDICATIONS:  Current Outpatient Medications  Medication Sig Dispense Refill   ALPRAZolam (XANAX) 0.5 MG tablet Take 1 tablet (0.5 mg total) by mouth at bedtime as needed for anxiety or sleep. TAKE (1) TABLET BY MOUTH AT BEDTIME AS NEEDED FOR SLEEP. 30 tablet 5   apixaban (ELIQUIS) 5 MG TABS tablet Take 1 tablet (5 mg total) by mouth 2 (two) times daily. 60 tablet 6   benzonatate (TESSALON) 100 MG capsule Take 1 capsule (100 mg total) by mouth 3 (three) times daily. 90 capsule 2   butalbital-acetaminophen-caffeine (FIORICET) 50-325-40 MG tablet Take 1/2 tablet by mouth every 6 (six) hours as needed for headache. 30 tablet 2   cyclobenzaprine (FLEXERIL) 5 MG tablet Take 1 tablet (5 mg total) by mouth 3 (three) times daily as needed for muscle spasms (neck/shoulder pain). 30 tablet 0   docusate sodium (COLACE) 100 MG capsule Take 1 capsule (100 mg total) by mouth 2 (two) times daily. 60 capsule 0   famotidine (PEPCID) 20 MG tablet Take 1 tablet (20 mg total) by mouth 2 (two) times daily. 60 tablet 6   folic acid (FOLVITE) 1 MG tablet Take 1 tablet (1 mg total) by mouth daily. 30 tablet 0   hydrochlorothiazide (HYDRODIURIL) 25 MG tablet Take 1 tablet (25 mg total) by mouth daily. 30 tablet 6   lansoprazole (PREVACID) 30 MG capsule Take one po qd for acid reflux 90 capsule 1   Menthol-Methyl Salicylate (MUSCLE RUB) 10-15 %  CREA Apply 1 Application topically 3 (three) times daily to bilateral calves 85 g 0   Multiple Vitamin (MULTIVITAMIN WITH MINERALS) TABS tablet Take 1 tablet by mouth daily.     PARoxetine (PAXIL) 40 MG tablet TAKE 1 TABLET BY MOUTH EVERY DAY IN THE MORNING (Patient taking differently: Take 40 mg by mouth in the morning.) 90 tablet 2   potassium chloride SA (KLOR-CON M) 20 MEQ tablet Take 1 tablet (20 mEq total) by mouth 3 (three) times daily. 90 tablet 1   rosuvastatin (CRESTOR) 20 MG tablet Take 1  tablet (20 mg total) by mouth daily. 30 tablet 6   senna-docusate (SENOKOT-S) 8.6-50 MG tablet Take 3 tablets by mouth 2 (two) times daily. 180 tablet 0   topiramate (TOPAMAX) 100 MG tablet Take 1 tablet (100 mg total) by mouth 2 (two) times daily. 60 tablet 6   ondansetron (ZOFRAN) 8 MG tablet Take 1 tablet (8 mg total) by mouth every 8 (eight) hours as needed for nausea or vomiting. (Patient not taking: Reported on 03/03/2023) 30 tablet 2   prochlorperazine (COMPAZINE) 10 MG tablet Take 1 tablet (10 mg total) by mouth every 6 (six) hours as needed for nausea or vomiting. (Patient not taking: Reported on 03/03/2023) 30 tablet 0   No current facility-administered medications for this visit.    SURGICAL HISTORY:  Past Surgical History:  Procedure Laterality Date   ABDOMINAL HYSTERECTOMY     CATARACT EXTRACTION W/PHACO  09/12/2011   Procedure: CATARACT EXTRACTION PHACO AND INTRAOCULAR LENS PLACEMENT (IOC);  Surgeon: Tonny Branch;  Location: [facility];  Service: Ophthalmology;  Laterality: Left;  CDE: 8.91   CATARACT EXTRACTION W/PHACO  11/18/2011   Procedure: CATARACT EXTRACTION PHACO AND INTRAOCULAR LENS PLACEMENT (IOC);  Surgeon: Tonny Branch;  Location: [facility];  Service: Ophthalmology;  Laterality: Right;  CDE:10.26   COLONOSCOPY  2007   Dr. Gala Romney: internal hemorrhoids, single anal papilla poor prep.    COLONOSCOPY N/A 03/27/2017   Rourk: Normal exam.   ECTOPIC PREGNANCY SURGERY     removal of right tube   ESOPHAGOGASTRODUODENOSCOPY (EGD) WITH PROPOFOL N/A 09/10/2021   Procedure: ESOPHAGOGASTRODUODENOSCOPY (EGD) WITH PROPOFOL;  Surgeon: Daneil Dolin, MD;  Location: [facility];  Service: Endoscopy;  Laterality: N/A;  8:15AM   MALONEY DILATION N/A 09/10/2021   Procedure: Venia Minks DILATION;  Surgeon: Daneil Dolin, MD;  Location: [facility];  Service: Endoscopy;  Laterality: N/A;   OVARIAN CYST REMOVAL     right   RECTOCELE REPAIR N/A 01/18/2014   Procedure: POSTERIOR REPAIR (RECTOCELE);   Surgeon: Jonnie Kind, MD;  Location: [facility];  Service: Gynecology;  Laterality: N/A;   VAGINAL HYSTERECTOMY N/A 01/18/2014   Procedure: HYSTERECTOMY VAGINAL;  Surgeon: Jonnie Kind, MD;  Location: [facility];  Service: Gynecology;  Laterality: N/A;    REVIEW OF SYSTEMS:  A comprehensive review of systems was negative except for: Constitutional: positive for fatigue   PHYSICAL EXAMINATION: General appearance: alert, cooperative, fatigued, and no distress Head: Normocephalic, without obvious abnormality, atraumatic Neck: no adenopathy, no JVD, supple, symmetrical, trachea midline, and thyroid not enlarged, symmetric, no tenderness/mass/nodules Lymph nodes: Cervical, supraclavicular, and axillary nodes normal. Resp: clear to auscultation bilaterally Back: symmetric, no curvature. ROM normal. No CVA tenderness. Cardio: regular rate and rhythm, S1, S2 normal, no murmur, click, rub or gallop GI: soft, non-tender; bowel sounds normal; no masses,  no organomegaly Extremities: extremities normal, atraumatic, no cyanosis or edema  ECOG PERFORMANCE STATUS: 1 - Symptomatic but completely  ambulatory  Blood pressure 137/79, pulse 68, temperature 97.7 F (36.5 C), temperature source Oral, resp. rate 16, weight 128 lb 12.8 oz (58.4 kg), SpO2 99 %.  LABORATORY DATA: Lab Results  Component Value Date   WBC 6.6 02/18/2023   HGB 13.2 02/18/2023   HCT 39.7 02/18/2023   MCV 88.6 02/18/2023   PLT 230 02/18/2023      Chemistry      Component Value Date/Time   NA 142 02/18/2023 1310   NA 139 08/02/2022 1112   K 4.1 02/18/2023 1310   CL 111 02/18/2023 1310   CO2 25 02/18/2023 1310   BUN 24 (H) 02/18/2023 1310   BUN 16 08/02/2022 1112   CREATININE 0.73 02/18/2023 1310      Component Value Date/Time   CALCIUM 10.0 02/18/2023 1310   ALKPHOS 82 02/18/2023 1310   AST 21 02/18/2023 1310   ALT 15 02/18/2023 1310   BILITOT 0.3 02/18/2023 1310       RADIOGRAPHIC STUDIES: No results  found.  ASSESSMENT AND PLAN: This is a very pleasant 77 years old white female with Stage IV (T1c, N3, M1 C) non-small cell lung cancer, adenocarcinoma presented with left upper lobe metastatic neoplasm presented with left lower lobe lung nodule in addition to extensive lymphadenopathy involving the neck, left hilar and mediastinal lymphadenopathy as well as upper abdomen lymph nodes and multiple subcutaneous/muscular nodules diagnosed in January 2024. Molecular studies showed positive KRAS G12C mutation and PD-L1 expression was 99%. The patient started systemic chemotherapy initially with carboplatin for AUC of 5, Alimta 500 Mg/M2 and Keytruda 200 Mg IV every 3 weeks for 1 cycle but this chemotherapy was discontinued after the patient developed stroke.  She started the second cycle of her treatment with single agent Keytruda every 3 weeks and she tolerated this treatment fairly well. I recommended for her to proceed with cycle #3 next week as planned. She will come back for follow-up visit before starting cycle #3. She had repeat MRI of the brain by her neurologist but the result are still pending. The patient was advised to call immediately if she has any concerning symptoms in the interval. The patient voices understanding of current disease status and treatment options and is in agreement with the current care plan.  All questions were answered. The patient knows to call the clinic with any problems, questions or concerns. We can certainly see the patient much sooner if necessary.  The total time spent in the appointment was 20 minutes.  Disclaimer: This note was dictated with voice recognition software. Similar sounding words can inadvertently be transcribed and may not be corrected upon review.

## 2023-03-06 ENCOUNTER — Encounter: Payer: Medicare HMO | Admitting: Physical Medicine & Rehabilitation

## 2023-03-07 NOTE — Progress Notes (Unsigned)
[facility] OFFICE PROGRESS NOTE  Patricia Drown, MD [place] [address]  DIAGNOSIS: Stage IV (T1c, N3, M1 C) non-small cell lung cancer, adenocarcinoma presented with left upper lobe metastatic neoplasm presented with left lower lobe lung nodule in addition to extensive lymphadenopathy involving the neck, left hilar and mediastinal lymphadenopathy as well as upper abdomen lymph nodes and multiple subcutaneous/muscular nodules diagnosed in January 2024.   Molecular studies: [id number]   PD-L1 expression: 99%  PRIOR THERAPY: None  CURRENT THERAPY: Systemic chemotherapy with carboplatin for AUC of 5, Alimta 500 Mg/M2 and Keytruda 200 Mg IV every 3 weeks.  First dose is planned for December 30, 2022. Starting from cycle #2 on 02/18/23 she will be on single agent Keytruda 200 mg IV every 3 weeks due to her recent stroke and physical debility. She is status post 2 cycles of treatment.   INTERVAL HISTORY: Patricia [name] 77 y.o. female returns to the clinic today for a follow-up visit accompanied by her sister.  The patient is currently being treated with single agent immunotherapy with [facility] for her lung cancer.  She is tolerating immunotherapy well.  He had a stroke in February 2024 and she is following closely with neurology. She has an appointment tomorrow. She still is having some visual disturbances with double vision. She has an upcoming appointment with an eye doctor.   Today, she denies any fever, chills, or night sweats. Her weight is stable. She is scheduled to see a member of the nutritionist team while in the infusion room today. She reports she has a cough "every once in awhile". She denies any dyspnea, chest pain, or hemoptysis. She is currently undergoing occupational therapy and physical therapy at home. She did not end up doing speech therapy. Denies any diarrhea or constipation. She did recently have a repeat brain MRI with her PCP.  She is  here today for evaluation and repeat blood work before undergoing cycle #3.  MEDICAL HISTORY: Past Medical History:  Diagnosis Date   Depression    GERD (gastroesophageal reflux disease)    Hypercholesterolemia    IFG (impaired fasting glucose)    Nonintractable episodic headache 01/18/2023   Osteopenia    PONV (postoperative nausea and vomiting)     ALLERGIES:  is allergic to codeine.  MEDICATIONS:  Current Outpatient Medications  Medication Sig Dispense Refill   ALPRAZolam (XANAX) 0.5 MG tablet Take 1 tablet (0.5 mg total) by mouth at bedtime as needed for anxiety or sleep. TAKE (1) TABLET BY MOUTH AT BEDTIME AS NEEDED FOR SLEEP. 30 tablet 5   apixaban (ELIQUIS) 5 MG TABS tablet Take 1 tablet (5 mg total) by mouth 2 (two) times daily. 60 tablet 6   benzonatate (TESSALON) 100 MG capsule Take 1 capsule (100 mg total) by mouth 3 (three) times daily. 90 capsule 2   butalbital-acetaminophen-caffeine (FIORICET) 50-325-40 MG tablet Take 1/2 tablet by mouth every 6 (six) hours as needed for headache. 30 tablet 2   cyclobenzaprine (FLEXERIL) 5 MG tablet Take 1 tablet (5 mg total) by mouth 3 (three) times daily as needed for muscle spasms (neck/shoulder pain). 30 tablet 0   docusate sodium (COLACE) 100 MG capsule Take 1 capsule (100 mg total) by mouth 2 (two) times daily. 60 capsule 0   famotidine (PEPCID) 20 MG tablet Take 1 tablet (20 mg total) by mouth 2 (two) times daily. 60 tablet 6   hydrochlorothiazide (HYDRODIURIL) 25 MG tablet Take 1 tablet (25  mg total) by mouth daily. 30 tablet 6   lansoprazole (PREVACID) 30 MG capsule Take one po qd for acid reflux 90 capsule 1   Menthol-Methyl Salicylate (MUSCLE RUB) 10-15 % CREA Apply 1 Application topically 3 (three) times daily to bilateral calves 85 g 0   Multiple Vitamin (MULTIVITAMIN WITH MINERALS) TABS tablet Take 1 tablet by mouth daily.     ondansetron (ZOFRAN) 8 MG tablet Take 1 tablet (8 mg total) by mouth every 8 (eight) hours as needed  for nausea or vomiting. (Patient not taking: Reported on 03/03/2023) 30 tablet 2   PARoxetine (PAXIL) 40 MG tablet TAKE 1 TABLET BY MOUTH EVERY DAY IN THE MORNING (Patient taking differently: Take 40 mg by mouth in the morning.) 90 tablet 2   potassium chloride SA (KLOR-CON M) 20 MEQ tablet Take 1 tablet (20 mEq total) by mouth 3 (three) times daily. 90 tablet 1   prochlorperazine (COMPAZINE) 10 MG tablet Take 1 tablet (10 mg total) by mouth every 6 (six) hours as needed for nausea or vomiting. (Patient not taking: Reported on 03/03/2023) 30 tablet 0   rosuvastatin (CRESTOR) 20 MG tablet Take 1 tablet (20 mg total) by mouth daily. 30 tablet 6   senna-docusate (SENOKOT-S) 8.6-50 MG tablet Take 3 tablets by mouth 2 (two) times daily. 180 tablet 0   topiramate (TOPAMAX) 100 MG tablet Take 1 tablet (100 mg total) by mouth 2 (two) times daily. 60 tablet 6   No current facility-administered medications for this visit.   Facility-Administered Medications Ordered in Other Visits  Medication Dose Route Frequency Provider Last Rate Last Admin   0.9 %  sodium chloride infusion   Intravenous Continuous Malgorzata Albert L, PA-C 500 mL/hr at 03/11/23 1153 New Bag at 03/11/23 1153   pembrolizumab (KEYTRUDA) 200 mg in sodium chloride 0.9 % 50 mL chemo infusion  200 mg Intravenous Once Curt Bears, MD        SURGICAL HISTORY:  Past Surgical History:  Procedure Laterality Date   ABDOMINAL HYSTERECTOMY     CATARACT EXTRACTION W/PHACO  09/12/2011   Procedure: CATARACT EXTRACTION PHACO AND INTRAOCULAR LENS PLACEMENT ([place]);  Surgeon: Tonny Branch;  Location: [facility];  Service: Ophthalmology;  Laterality: Left;  CDE: 8.91   CATARACT EXTRACTION W/PHACO  11/18/2011   Procedure: CATARACT EXTRACTION PHACO AND INTRAOCULAR LENS PLACEMENT ([facility]);  Surgeon: Tonny Branch;  Location: [facility];  Service: Ophthalmology;  Laterality: Right;  CDE:10.26   COLONOSCOPY  2007   Dr. Gala Romney: internal hemorrhoids, single anal  papilla poor prep.    COLONOSCOPY N/A 03/27/2017   [place]: Normal exam.   ECTOPIC PREGNANCY SURGERY     removal of right tube   ESOPHAGOGASTRODUODENOSCOPY (EGD) WITH PROPOFOL N/A 09/10/2021   Procedure: ESOPHAGOGASTRODUODENOSCOPY (EGD) WITH PROPOFOL;  Surgeon: Daneil Dolin, MD;  Location: [facility];  Service: Endoscopy;  Laterality: N/A;  8:15AM   MALONEY DILATION N/A 09/10/2021   Procedure: Venia Minks DILATION;  Surgeon: Daneil Dolin, MD;  Location: [facility];  Service: Endoscopy;  Laterality: N/A;   OVARIAN CYST REMOVAL     right   RECTOCELE REPAIR N/A 01/18/2014   Procedure: POSTERIOR REPAIR (RECTOCELE);  Surgeon: Jonnie Kind, MD;  Location: [facility];  Service: Gynecology;  Laterality: N/A;   VAGINAL HYSTERECTOMY N/A 01/18/2014   Procedure: HYSTERECTOMY VAGINAL;  Surgeon: Jonnie Kind, MD;  Location: [facility];  Service: Gynecology;  Laterality: N/A;    REVIEW OF SYSTEMS:   Review of Systems  Constitutional: Negative  for appetite change, chills, fatigue, fever and unexpected weight change.  HENT:  Positive for hoarseness. Negative for mouth sores, nosebleeds, sore throat and trouble swallowing.   Eyes: Positive for diplopia and visual field defects. Negative for icterus.  Respiratory: Positive for mild intermittent cough. Negative for hemoptysis, shortness of breath and wheezing.   Cardiovascular: Negative for chest pain and leg swelling.  Gastrointestinal: Negative for abdominal pain, constipation, diarrhea, nausea and vomiting.  Genitourinary: Negative for bladder incontinence, difficulty urinating, dysuria, frequency and hematuria.   Musculoskeletal: Positive for gait changes due to stroke. Negative for back pain, gait problem, neck pain and neck stiffness.  Skin: Negative for itching and rash.  Neurological: Negative for dizziness, extremity weakness, gait problem, headaches, light-headedness and seizures.  Hematological: Negative for adenopathy. Does not bruise/bleed  easily.  Psychiatric/Behavioral: Negative for confusion, depression and sleep disturbance. The patient is not nervous/anxious.     PHYSICAL EXAMINATION:  Blood pressure 98/78, pulse 65, temperature 97.7 F (36.5 C), temperature source Temporal, resp. rate 15, height 4' 9.99" (1.473 m), weight 130 lb 3.2 oz (59.1 kg), SpO2 98 %.  ECOG PERFORMANCE STATUS: 2-3  Physical Exam  Constitutional: Oriented to person, place, and time and well-developed, well-nourished, and in no distress.  HENT:  Head: Normocephalic and atraumatic.  Mouth/Throat: Positive for hoarseness in the voice. Oropharynx is clear and moist. No oropharyngeal exudate.  Eyes: Conjunctivae are normal. Right eye exhibits no discharge. Left eye exhibits no discharge. No scleral icterus.  Neck: Normal range of motion. Neck supple.  Cardiovascular: Normal rate, regular rhythm, normal heart sounds and intact distal pulses.   Pulmonary/Chest: Effort normal and breath sounds normal. No respiratory distress. No wheezes. No rales.  Abdominal: Soft. Bowel sounds are normal. Exhibits no distension and no mass. There is no tenderness.  Musculoskeletal: Normal range of motion. Exhibits no edema.  Lymphadenopathy:    No cervical adenopathy.  Neurological: Alert and oriented to person, place, and time. Exhibits muscle wasting.  She was examined in the wheelchair.  Skin: Skin is warm and dry. No rash noted. Not diaphoretic. No erythema. No pallor.  Psychiatric: Mood, memory and judgment normal.  Vitals reviewed.  LABORATORY DATA: Lab Results  Component Value Date   WBC 7.3 03/11/2023   HGB 13.9 03/11/2023   HCT 41.4 03/11/2023   MCV 88.3 03/11/2023   PLT 245 03/11/2023      Chemistry      Component Value Date/Time   NA 141 03/11/2023 0949   NA 139 08/02/2022 1112   K 3.4 (L) 03/11/2023 0949   CL 110 03/11/2023 0949   CO2 24 03/11/2023 0949   BUN 19 03/11/2023 0949   BUN 16 08/02/2022 1112   CREATININE 0.76 03/11/2023 0949       Component Value Date/Time   CALCIUM 10.0 03/11/2023 0949   ALKPHOS 75 03/11/2023 0949   AST 19 03/11/2023 0949   ALT 15 03/11/2023 0949   BILITOT 0.3 03/11/2023 0949       RADIOGRAPHIC STUDIES:  MR Brain W Wo Contrast  Result Date: 03/03/2023 CLINICAL DATA:  Follow-up enhancing lesion in the right frontal lobe. EXAM: MRI HEAD WITHOUT AND WITH CONTRAST TECHNIQUE: Multiplanar, multiecho pulse sequences of the brain and surrounding structures were obtained without and with intravenous contrast. CONTRAST:  5.28mL GADAVIST GADOBUTROL 1 MMOL/ML IV SOLN COMPARISON:  MRI brain 01/11/2023. FINDINGS: Brain: No acute infarct or intracranial hemorrhage. Interval development of a now chronic appearing infarct along the right middle frontal gyrus with overlying cortical  laminar necrosis. Interval resolution of the previously seen focus of abnormal enhancement along the right precentral gyrus. No new foci of abnormal enhancement. Unchanged mild chronic small-vessel disease with scattered old infarcts. No hydrocephalus or extra-axial collection. No mass or midline shift. Vascular: Normal flow voids and vessel enhancement. Skull and upper cervical spine: Normal marrow signal and enhancement. Moderate degenerative changes of the upper cervical spine. Sinuses/Orbits: Unremarkable. Other: None. IMPRESSION: 1. Interval development of a now chronic appearing infarct along the right middle frontal gyrus with overlying cortical laminar necrosis. 2. Interval resolution of the previously seen focus of abnormal enhancement along the right precentral gyrus. No new foci of abnormal enhancement. Electronically Signed   By: Emmit Alexanders M.D.   On: 03/03/2023 19:57     ASSESSMENT/PLAN:  This is a very pleasant 77 year old Caucasian female diagnosed with stage IV (T1c, N3, M1 C) non-small cell lung cancer, adenocarcinoma.  The patient presented with a left upper lobe metastatic neoplasm in addition to extensive  lymphadenopathy involving the neck, left hilar, mediastinal, and upper abdominal lymph nodes with multiple subcutaneous/muscular nodules.  She was diagnosed in January 2024.  Her molecular studies show she is positive for K-ras G12 C which can be used in the second line setting.  Her PD-L1 expression is 99%    The patient is going to start systemic/palliative chemotherapy and immunotherapy with carboplatin for AUC of 5, Alimta 500 mg/m, Keytruda 200 mg IV every 3 weeks.  Her first cycle of treatment on 12/30/22.  She started on treatment with single agent immunotherapy with Keytruda 200 mg IV starting from cycle #2 due to her recent stroke.    Unfortunately, the patient was hospitalized for most of February [id number] due to embolic strokes.     Labs were reviewed.  She will proceed with cycle #3 today scheduled.  I will arrange for restaging CT scan prior to her next cycle of treatment.   She will be on Eliquis indefinitely due to her bilateral DVTs and strokes in the setting of malignancy.  She is scheduled to see neurology tomorrow for her history of strokes. I did read to her her PCP's result note for her recent MRI since she missed the call from their office to review. She will check with neurology tomorrow if she should see her eye doctor for her visual changes or if she should see an ophthalmologist that specializes in this.    She is scheduled to see nutrition today. We will arrange for 1/2 L of fluid today for her borderline hypotension and encourage her to increase her hydration.    Will see her back for follow-up visit in 3 weeks for evaluation and to review her scan before undergoing cycle #4.  The patient was advised to call immediately if she has any concerning symptoms in the interval. The patient voices understanding of current disease status and treatment options and is in agreement with the current care plan. All questions were answered. The patient knows to call the clinic with any  problems, questions or concerns. We can certainly see the patient much sooner if necessary      Orders Placed This Encounter  Procedures   CT CHEST ABDOMEN PELVIS W CONTRAST    Standing Status:   Future    Standing Expiration Date:   03/10/2024    Order Specific Question:   If indicated for the ordered procedure, I authorize the administration of contrast media per Radiology protocol    Answer:   Yes  Order Specific Question:   Does the patient have a contrast media/X-ray dye allergy?    Answer:   No    Order Specific Question:   Preferred imaging location?    Answer:   [facility]    Order Specific Question:   Is Oral Contrast requested for this exam?    Answer:   Yes, Per Radiology protocol     The total time spent in the appointment was 20-29 minutes  [place], PA-C 03/11/23

## 2023-03-11 ENCOUNTER — Inpatient Hospital Stay: Payer: Medicare HMO | Attending: Internal Medicine

## 2023-03-11 ENCOUNTER — Other Ambulatory Visit: Payer: Self-pay

## 2023-03-11 ENCOUNTER — Inpatient Hospital Stay: Payer: Medicare HMO | Admitting: Dietician

## 2023-03-11 ENCOUNTER — Telehealth: Payer: Self-pay | Admitting: Medical Oncology

## 2023-03-11 ENCOUNTER — Encounter: Payer: Self-pay | Admitting: Medical Oncology

## 2023-03-11 ENCOUNTER — Inpatient Hospital Stay: Payer: Medicare HMO

## 2023-03-11 ENCOUNTER — Inpatient Hospital Stay (HOSPITAL_BASED_OUTPATIENT_CLINIC_OR_DEPARTMENT_OTHER): Payer: Medicare HMO | Admitting: Physician Assistant

## 2023-03-11 VITALS — BP 98/78 | HR 65 | Temp 97.7°F | Resp 15 | Ht <= 58 in | Wt 130.2 lb

## 2023-03-11 VITALS — BP 131/70 | HR 59 | Resp 17

## 2023-03-11 DIAGNOSIS — Z5112 Encounter for antineoplastic immunotherapy: Secondary | ICD-10-CM

## 2023-03-11 DIAGNOSIS — C3492 Malignant neoplasm of unspecified part of left bronchus or lung: Secondary | ICD-10-CM

## 2023-03-11 DIAGNOSIS — Z7901 Long term (current) use of anticoagulants: Secondary | ICD-10-CM | POA: Insufficient documentation

## 2023-03-11 DIAGNOSIS — C778 Secondary and unspecified malignant neoplasm of lymph nodes of multiple regions: Secondary | ICD-10-CM | POA: Insufficient documentation

## 2023-03-11 DIAGNOSIS — C3412 Malignant neoplasm of upper lobe, left bronchus or lung: Secondary | ICD-10-CM | POA: Diagnosis not present

## 2023-03-11 DIAGNOSIS — Z86718 Personal history of other venous thrombosis and embolism: Secondary | ICD-10-CM | POA: Insufficient documentation

## 2023-03-11 DIAGNOSIS — Z8673 Personal history of transient ischemic attack (TIA), and cerebral infarction without residual deficits: Secondary | ICD-10-CM | POA: Insufficient documentation

## 2023-03-11 LAB — CBC WITH DIFFERENTIAL (CANCER CENTER ONLY)
Abs Immature Granulocytes: 0.01 10*3/uL (ref 0.00–0.07)
Basophils Absolute: 0.1 10*3/uL (ref 0.0–0.1)
Basophils Relative: 1 %
Eosinophils Absolute: 0.1 10*3/uL (ref 0.0–0.5)
Eosinophils Relative: 2 %
HCT: 41.4 % (ref 36.0–46.0)
Hemoglobin: 13.9 g/dL (ref 12.0–15.0)
Immature Granulocytes: 0 %
Lymphocytes Relative: 32 %
Lymphs Abs: 2.3 10*3/uL (ref 0.7–4.0)
MCH: 29.6 pg (ref 26.0–34.0)
MCHC: 33.6 g/dL (ref 30.0–36.0)
MCV: 88.3 fL (ref 80.0–100.0)
Monocytes Absolute: 0.4 10*3/uL (ref 0.1–1.0)
Monocytes Relative: 6 %
Neutro Abs: 4.3 10*3/uL (ref 1.7–7.7)
Neutrophils Relative %: 59 %
Platelet Count: 245 10*3/uL (ref 150–400)
RBC: 4.69 MIL/uL (ref 3.87–5.11)
RDW: 14.6 % (ref 11.5–15.5)
WBC Count: 7.3 10*3/uL (ref 4.0–10.5)
nRBC: 0 % (ref 0.0–0.2)

## 2023-03-11 LAB — CMP (CANCER CENTER ONLY)
ALT: 15 U/L (ref 0–44)
AST: 19 U/L (ref 15–41)
Albumin: 4 g/dL (ref 3.5–5.0)
Alkaline Phosphatase: 75 U/L (ref 38–126)
Anion gap: 7 (ref 5–15)
BUN: 19 mg/dL (ref 8–23)
CO2: 24 mmol/L (ref 22–32)
Calcium: 10 mg/dL (ref 8.9–10.3)
Chloride: 110 mmol/L (ref 98–111)
Creatinine: 0.76 mg/dL (ref 0.44–1.00)
GFR, Estimated: >60 mL/min (ref >60)
Glucose, Bld: 109 mg/dL — ABNORMAL HIGH (ref 70–99)
Potassium: 3.4 mmol/L — ABNORMAL LOW (ref 3.5–5.1)
Sodium: 141 mmol/L (ref 135–145)
Total Bilirubin: 0.3 mg/dL (ref 0.3–1.2)
Total Protein: 6.6 g/dL (ref 6.5–8.1)

## 2023-03-11 MED ORDER — SODIUM CHLORIDE 0.9 % IV SOLN: INTRAVENOUS | Status: AC

## 2023-03-11 MED ORDER — SODIUM CHLORIDE 0.9 % IV SOLN
200.0000 mg | Freq: Once | INTRAVENOUS | Status: AC
  Administered 2023-03-11: 200 mg via INTRAVENOUS
  Filled 2023-03-11: qty 8

## 2023-03-11 NOTE — Patient Instructions (Signed)
[facility]  Discharge Instructions: Thank you for choosing [facility] to provide your oncology and hematology care.   If you have a lab appointment with the Cancer Center, please go directly to the Cancer Center and check in at the registration area.   Wear comfortable clothing and clothing appropriate for easy access to any Portacath or PICC line.   We strive to give you quality time with your provider. You may need to reschedule your appointment if you arrive late (15 or more minutes).  Arriving late affects you and other patients whose appointments are after yours.  Also, if you miss three or more appointments without notifying the office, you may be dismissed from the clinic at the provider's discretion.      For prescription refill requests, have your pharmacy contact our office and allow 72 hours for refills to be completed.    Today you received the following chemotherapy and/or immunotherapy agents Keytruda      To help prevent nausea and vomiting after your treatment, we encourage you to take your nausea medication as directed.  BELOW ARE SYMPTOMS THAT SHOULD BE REPORTED IMMEDIATELY: *FEVER GREATER THAN 100.4 F (38 C) OR HIGHER *CHILLS OR SWEATING *NAUSEA AND VOMITING THAT IS NOT CONTROLLED WITH YOUR NAUSEA MEDICATION *UNUSUAL SHORTNESS OF BREATH *UNUSUAL BRUISING OR BLEEDING *URINARY PROBLEMS (pain or burning when urinating, or frequent urination) *BOWEL PROBLEMS (unusual diarrhea, constipation, pain near the anus) TENDERNESS IN MOUTH AND THROAT WITH OR WITHOUT PRESENCE OF ULCERS (sore throat, sores in mouth, or a toothache) UNUSUAL RASH, SWELLING OR PAIN  UNUSUAL VAGINAL DISCHARGE OR ITCHING   Items with * indicate a potential emergency and should be followed up as soon as possible or go to the Emergency Department if any problems should occur.  Please show the CHEMOTHERAPY ALERT CARD or IMMUNOTHERAPY ALERT CARD at  check-in to the Emergency Department and triage nurse.  Should you have questions after your visit or need to cancel or reschedule your appointment, please contact [facility]  Dept: [phone]  and follow the prompts.  Office hours are 8:00 a.m. to 4:30 p.m. Monday - Friday. Please note that voicemails left after 4:00 p.m. may not be returned until the following business day.  We are closed weekends and major holidays. You have access to a nurse at all times for urgent questions. Please call the main number to the clinic Dept: [phone] and follow the prompts.   For any non-urgent questions, you may also contact your provider using MyChart. We now offer e-Visits for anyone 18 and older to request care online for non-urgent symptoms. For details visit [email or web address].   Also download the MyChart app! Go to the app store, search "MyChart", open the app, select [facility], and log in with your MyChart username and password. Pembrolizumab Injection What is this medication? PEMBROLIZUMAB (PEM broe LIZ ue mab) treats some types of cancer. It works by helping your immune system slow or stop the spread of cancer cells. It is a monoclonal antibody. This medicine may be used for other purposes; ask your health care provider or pharmacist if you have questions. COMMON BRAND NAME(S): Keytruda What should I tell my care team before I take this medication? They need to know if you have any of these conditions: Allogeneic stem cell transplant (uses someone else's stem cells) Autoimmune diseases, such as Crohn disease, ulcerative colitis, lupus History of chest radiation Nervous system   problems, such as Guillain-Barre syndrome, myasthenia gravis Organ transplant An unusual or allergic reaction to pembrolizumab, other medications, foods, dyes, or preservatives Pregnant or trying to get pregnant Breast-feeding How should I use this medication? This medication  is injected into a vein. It is given by your care team in a hospital or clinic setting. A special MedGuide will be given to you before each treatment. Be sure to read this information carefully each time. Talk to your care team about the use of this medication in children. While it may be prescribed for children as young as 6 months for selected conditions, precautions do apply. Overdosage: If you think you have taken too much of this medicine contact a poison control center or emergency room at once. NOTE: This medicine is only for you. Do not share this medicine with others. What if I miss a dose? Keep appointments for follow-up doses. It is important not to miss your dose. Call your care team if you are unable to keep an appointment. What may interact with this medication? Interactions have not been studied. This list may not describe all possible interactions. Give your health care provider a list of all the medicines, herbs, non-prescription drugs, or dietary supplements you use. Also tell them if you smoke, drink alcohol, or use illegal drugs. Some items may interact with your medicine. What should I watch for while using this medication? Your condition will be monitored carefully while you are receiving this medication. You may need blood work while taking this medication. This medication may cause serious skin reactions. They can happen weeks to months after starting the medication. Contact your care team right away if you notice fevers or flu-like symptoms with a rash. The rash may be red or purple and then turn into blisters or peeling of the skin. You may also notice a red rash with swelling of the face, lips, or lymph nodes in your neck or under your arms. Tell your care team right away if you have any change in your eyesight. Talk to your care team if you may be pregnant. Serious birth defects can occur if you take this medication during pregnancy and for 4 months after the last dose. You  will need a negative pregnancy test before starting this medication. Contraception is recommended while taking this medication and for 4 months after the last dose. Your care team can help you find the option that works for you. Do not breastfeed while taking this medication and for 4 months after the last dose. What side effects may I notice from receiving this medication? Side effects that you should report to your care team as soon as possible: Allergic reactions--skin rash, itching, hives, swelling of the face, lips, tongue, or throat Dry cough, shortness of breath or trouble breathing Eye pain, redness, irritation, or discharge with blurry or decreased vision Heart muscle inflammation--unusual weakness or fatigue, shortness of breath, chest pain, fast or irregular heartbeat, dizziness, swelling of the ankles, feet, or hands Hormone gland problems--headache, sensitivity to light, unusual weakness or fatigue, dizziness, fast or irregular heartbeat, increased sensitivity to cold or heat, excessive sweating, constipation, hair loss, increased thirst or amount of urine, tremors or shaking, irritability Infusion reactions--chest pain, shortness of breath or trouble breathing, feeling faint or lightheaded Kidney injury (glomerulonephritis)--decrease in the amount of urine, red or dark brown urine, foamy or bubbly urine, swelling of the ankles, hands, or feet Liver injury--right upper belly pain, loss of appetite, nausea, light-colored stool, dark yellow or brown   urine, yellowing skin or eyes, unusual weakness or fatigue Pain, tingling, or numbness in the hands or feet, muscle weakness, change in vision, confusion or trouble speaking, loss of balance or coordination, trouble walking, seizures Rash, fever, and swollen lymph nodes Redness, blistering, peeling, or loosening of the skin, including inside the mouth Sudden or severe stomach pain, bloody diarrhea, fever, nausea, vomiting Side effects that  usually do not require medical attention (report to your care team if they continue or are bothersome): Bone, joint, or muscle pain Diarrhea Fatigue Loss of appetite Nausea Skin rash This list may not describe all possible side effects. Call your doctor for medical advice about side effects. You may report side effects to FDA at 1-800-FDA-1088. Where should I keep my medication? This medication is given in a hospital or clinic. It will not be stored at home. NOTE: This sheet is a summary. It may not cover all possible information. If you have questions about this medicine, talk to your doctor, pharmacist, or health care provider.  2023 Elsevier/Gold Standard (2022-04-09 00:00:00)   

## 2023-03-11 NOTE — Progress Notes (Signed)
Nutrition Follow-up:  Patient with stage IV non small cell lung cancer. She is currently receiving single agent immunotherapy with Keytruda (started 3/12)  Met with patient in blue room. She completed treatment early today and is ready to get outside to warm up s/p infusion. Patient reports she has been doing well s/p recent hospitalizations. She continues PT twice weekly. States she is ready to return to work once her left leg gets stronger. Patient reports she is eating well and eating 3 meals daily. Patient normally drinks a good amount of water. Reports not wanting to keep running to the bathroom during treatment and did not drink much yesterday. She denies nutrition impact symptoms. Patient says all her side effects resolved once chemotherapy was discontinued.   Medications: xanax, eliquis, tessalon, flexeril, colace, pepcid, prevacid, MVI, klor-con, crestor, topomax  Labs: K 3.4, glucose 109  Anthropometrics: Wt 130 lb 3.2 oz today - trending up  3/25 - 128 lb 12.8 oz 3/6 - 123 lb 6.4 oz    NUTRITION DIAGNOSIS: Unintended wt loss improving    INTERVENTION:  Encouraged high protein snacks in between meals  Continue drinking Equate Plus, recommend once daily Contact information provided    MONITORING, EVALUATION, GOAL: Pt will tolerated increased calories and protein to promote weight stability   NEXT VISIT: To be scheduled as needed with treatment

## 2023-03-11 NOTE — Progress Notes (Signed)
Patient seen by Cassie Heilingoetter, PA-C  Vitals are within treatment parameters.  Labs reviewed: and are within treatment parameters.  Per physician team, patient is ready for treatment and there are NO modifications to the treatment plan.  

## 2023-03-11 NOTE — Telephone Encounter (Signed)
CT scan is scheduled for 04/22. Next lab /infusion is 04/23 F/U has not been scheduled for 04/23 " 03/11/2023 1057 - Cassandra L Heilingoetter, PA-C  Check-out note: Please make sure she has provider visit with next infusion on 4/23 -Labs, return visit, and infusion every 3 weeks.

## 2023-03-12 ENCOUNTER — Ambulatory Visit (INDEPENDENT_AMBULATORY_CARE_PROVIDER_SITE_OTHER): Payer: Medicare HMO | Admitting: Neurology

## 2023-03-12 ENCOUNTER — Encounter: Payer: Self-pay | Admitting: Neurology

## 2023-03-12 VITALS — BP 133/79 | HR 58 | Ht <= 58 in | Wt 132.0 lb

## 2023-03-12 DIAGNOSIS — I6340 Cerebral infarction due to embolism of unspecified cerebral artery: Secondary | ICD-10-CM

## 2023-03-12 NOTE — Progress Notes (Unsigned)
[facility]  PATIENT: Patricia [name] DOB: [birth year]  REQUESTING CLINICIAN: Aline August, MD HISTORY FROM: Patient, family and chart review  REASON FOR VISIT: Stroke iso hypercoagulability    HISTORICAL  CHIEF COMPLAINT:  Chief Complaint  Patient presents with   New Patient (Initial Visit)    Rm 13. Here with husband and grandson. Right eye vision still bothering since stroke, even though left side was affected. Patient now in wheelchair following the stroke because when she stands she falls toward the left.      HISTORY OF PRESENT ILLNESS:  Patient 77 year old woman past medical history including dysphagia status post esophageal dilation, vocal cord paralysis, stage IV non-small cell lung carcinoma with extensive adenopathy, currently on chemo who is presenting after being admitted to the hospital for headache and found to have new embolic strokes.  In brief, patient presented to the hospital due to headache, then she developed worsening weakness of the left lower extremity.  MRI brain was completed showed small scattered acute and subacute infarcts in the bilateral cerebral hemispheres worse on the right.  Stroke etiology likely embolic due to hypercoagulable state.  Patient was started on apixaban 5 mg twice daily.  She had extensive rehab with improved cognition and strength.  Currently she is discharged home but she will continue to get outpatient rehab.  Family reports patient continued to improve in her mentation and also strength.  She is compliant with all of her medications.  She had a repeat MRI brain last month to rule out metastasis, MRI was negative for any metastasis.   Hospital course and Summary  Brief HPI:   Patricia [name] is a 77 y.o. female with history of GERD, dysphagia s/p esophageal dilatation, left vocal cord paralysis, stage IV NSL with extensive adenopathy diagnosed 11/2022 and started on chemo in Jan.  She was admitted on 01/10/2023  with 2-week history of headaches that progressed to acute onset of LLE weakness and vertigo with difficulty walking on day of admission.  MRI brain done revealing small scattered acute and subacute infarcts in bilateral cerebral hemispheres and enhancing nodule right frontal cortex likely subacute infarct with recommendations repeat films in 6 to 8 weeks to rule out malignancy.     Neurology felt the stroke was embolic or due to hypercoagulable state due to cancer.  Her case was discussed with Dr. Julien Nordmann and she was started on Eliquis on 02/06.  MBS showed normal swallow function therefore she was maintained on dysphagia 3 with thin liquids limited distraction.  She continued to be limited by left-sided weakness with left lean, decreased awareness of deficits, poor attention was verbosity, cognitive deficits as well as persistent headaches.  CIR was recommended due to functional decline.     Hospital Course: Patricia [name] was admitted to rehab 01/17/2023 for inpatient therapies to consist of PT, ST and OT at least three hours five days a week. Past admission physiatrist, therapy team and rehab RN have worked together to provide customized collaborative inpatient rehab. She continued to have proximal coughing spells which resulted in worsening her headaches as well as caused her to vomit.  Chest x-ray done showed no evidence of pneumonia or aspiration.  Tessalon Perles was added to help manage symptoms.     At admission she was noted to be using  Fioricet frequently and reported constant headaches with minimal relief therefore Topamax was added at admission and titrated up to 100 mg twice daily with improvement in symptoms.  Fioricet  was tapered to 2.5 mg 3 times daily as needed by discharge.  Speech has been following for dysphagia treatment and recommended use of provide To prevent aspiration of thin liquids.  Bowel program has been augmented to help manage constipation.  Dulcolax suppository was offered  but she has refused this frequently.   Blood pressures were monitored on TID basis and has been variable.  HCTZ was resumed on 02/25 with resultant hypokalemia.  She was started on K-Dur for supplementation and this was titrated to 3 times daily prior to discharge.  Recommend recheck BMET in next 5 to 7 days to monitor for stability.  She continues to have issues with reflux symptoms therefore Pepcid twice daily was added to help manage symptoms.  Therapy reported LLE pain with activity and express concerns of DVT. On 02/10 and BLE Dopplers done due to history of malignancy.  Dopplers revealed RLE greater than LLE DVTs and Eliquis was increased to treatment dose of 10 mg twice daily x 7 days then prior dose resumed.  Nutritional supplements were offered during her stay and p.o. intake has improved overall.  She has had improvement in activity tolerance and has progressed to contact-guard to supervision level at discharge.  She will continue to receive follow-up home health PT, OT and ST by [facility] after discharge     Rehab course: During patient's stay in rehab weekly team conferences were held to monitor patient's progress, set goals and discuss barriers to discharge. At admission, patient required close to mod assist with mobility and min to max assist with ADL tasks.  She exhibited cognitive deficits impacting memory, language, cognitive flexibility, executive functioning and visual-spatial skills. She  has had improvement in activity tolerance, balance, postural control as well as ability to compensate for deficits. He/She has had improvement in functional use LUE  and LLE as well as improvement in awareness..  She requires supervision to contact-guard assist to complete ADL tasks. She requires contact-guard assist for sit to stand transfers and contact-guard to min assist to ambulate 180 feet with rolling walker.  She requires verbal cues for safe use of AE as well as sequencing and gait  pattern. She has made slow function cognitive gains and requires min verbal cues to complete functional and familiar tasks safely.  She she is tolerating regular textures with thin liquids with minimal overt signs or symptoms of aspiration with intermittent supervision needed for safe swallow strategies.  Her slums score has improved from 13/30 to 17/30. Family education has been completed with husband.    OTHER MEDICAL CONDITIONS: CVA, Dysphagia status post esophageal dilation, vocal cord paralysis, stage IV non-small cell lung carcinoma with extensive adenopathy.    REVIEW OF SYSTEMS: Full 14 system review of systems performed and negative with exception of: As noted in the HPI   ALLERGIES: Allergies  Allergen Reactions   Codeine Nausea Only    HOME MEDICATIONS: Outpatient Medications Prior to Visit  Medication Sig Dispense Refill   ALPRAZolam (XANAX) 0.5 MG tablet Take 1 tablet (0.5 mg total) by mouth at bedtime as needed for anxiety or sleep. TAKE (1) TABLET BY MOUTH AT BEDTIME AS NEEDED FOR SLEEP. 30 tablet 5   apixaban (ELIQUIS) 5 MG TABS tablet Take 1 tablet (5 mg total) by mouth 2 (two) times daily. 60 tablet 6   benzonatate (TESSALON) 100 MG capsule Take 1 capsule (100 mg total) by mouth 3 (three) times daily. 90 capsule 2   butalbital-acetaminophen-caffeine (FIORICET) 50-325-40 MG tablet Take  1/2 tablet by mouth every 6 (six) hours as needed for headache. 30 tablet 2   docusate sodium (COLACE) 100 MG capsule Take 1 capsule (100 mg total) by mouth 2 (two) times daily. 60 capsule 0   famotidine (PEPCID) 20 MG tablet Take 1 tablet (20 mg total) by mouth 2 (two) times daily. 60 tablet 6   hydrochlorothiazide (HYDRODIURIL) 25 MG tablet Take 1 tablet (25 mg total) by mouth daily. 30 tablet 6   lansoprazole (PREVACID) 30 MG capsule Take one po qd for acid reflux 90 capsule 1   PARoxetine (PAXIL) 40 MG tablet TAKE 1 TABLET BY MOUTH EVERY DAY IN THE MORNING (Patient taking differently:  Take 40 mg by mouth in the morning.) 90 tablet 2   prochlorperazine (COMPAZINE) 10 MG tablet Take 1 tablet (10 mg total) by mouth every 6 (six) hours as needed for nausea or vomiting. 30 tablet 0   rosuvastatin (CRESTOR) 20 MG tablet Take 1 tablet (20 mg total) by mouth daily. 30 tablet 6   topiramate (TOPAMAX) 100 MG tablet Take 1 tablet (100 mg total) by mouth 2 (two) times daily. 60 tablet 6   cyclobenzaprine (FLEXERIL) 5 MG tablet Take 1 tablet (5 mg total) by mouth 3 (three) times daily as needed for muscle spasms (neck/shoulder pain). (Patient not taking: Reported on 03/12/2023) 30 tablet 0   Menthol-Methyl Salicylate (MUSCLE RUB) 10-15 % CREA Apply 1 Application topically 3 (three) times daily to bilateral calves 85 g 0   Multiple Vitamin (MULTIVITAMIN WITH MINERALS) TABS tablet Take 1 tablet by mouth daily. (Patient not taking: Reported on 03/12/2023)     ondansetron (ZOFRAN) 8 MG tablet Take 1 tablet (8 mg total) by mouth every 8 (eight) hours as needed for nausea or vomiting. (Patient not taking: Reported on 03/03/2023) 30 tablet 2   potassium chloride SA (KLOR-CON M) 20 MEQ tablet Take 1 tablet (20 mEq total) by mouth 3 (three) times daily. (Patient not taking: Reported on 03/12/2023) 90 tablet 1   senna-docusate (SENOKOT-S) 8.6-50 MG tablet Take 3 tablets by mouth 2 (two) times daily. (Patient not taking: Reported on 03/12/2023) 180 tablet 0   No facility-administered medications prior to visit.    PAST MEDICAL HISTORY: Past Medical History:  Diagnosis Date   Depression    GERD (gastroesophageal reflux disease)    Hypercholesterolemia    IFG (impaired fasting glucose)    Nonintractable episodic headache 01/18/2023   Osteopenia    PONV (postoperative nausea and vomiting)     PAST SURGICAL HISTORY: Past Surgical History:  Procedure Laterality Date   ABDOMINAL HYSTERECTOMY     CATARACT EXTRACTION W/PHACO  09/12/2011   Procedure: CATARACT EXTRACTION PHACO AND INTRAOCULAR LENS PLACEMENT  ([facility]);  Surgeon: Tonny Branch;  Location: [facility];  Service: Ophthalmology;  Laterality: Left;  CDE: 8.91   CATARACT EXTRACTION W/PHACO  11/18/2011   Procedure: CATARACT EXTRACTION PHACO AND INTRAOCULAR LENS PLACEMENT ([facility]);  Surgeon: Tonny Branch;  Location: [facility];  Service: Ophthalmology;  Laterality: Right;  CDE:10.26   COLONOSCOPY  2007   Dr. Gala Romney: internal hemorrhoids, single anal papilla poor prep.    COLONOSCOPY N/A 03/27/2017   Rourk: Normal exam.   ECTOPIC PREGNANCY SURGERY     removal of right tube   ESOPHAGOGASTRODUODENOSCOPY (EGD) WITH PROPOFOL N/A 09/10/2021   Procedure: ESOPHAGOGASTRODUODENOSCOPY (EGD) WITH PROPOFOL;  Surgeon: Daneil Dolin, MD;  Location: [facility];  Service: Endoscopy;  Laterality: N/A;  8:15AM   MALONEY DILATION N/A 09/10/2021   Procedure:  MALONEY DILATION;  Surgeon: Daneil Dolin, MD;  Location: [facility];  Service: Endoscopy;  Laterality: N/A;   OVARIAN CYST REMOVAL     right   RECTOCELE REPAIR N/A 01/18/2014   Procedure: POSTERIOR REPAIR (RECTOCELE);  Surgeon: Jonnie Kind, MD;  Location: [facility];  Service: Gynecology;  Laterality: N/A;   VAGINAL HYSTERECTOMY N/A 01/18/2014   Procedure: HYSTERECTOMY VAGINAL;  Surgeon: Jonnie Kind, MD;  Location: [facility];  Service: Gynecology;  Laterality: N/A;    FAMILY HISTORY: Family History  Problem Relation Age of Onset   Heart attack Father    Osteoporosis Mother    Heart attack Brother 29       MI   Breast cancer Maternal Aunt    Breast cancer Paternal Aunt    Cancer Paternal Aunt        ovarian cancer   Anesthesia problems Neg Hx    Hypotension Neg Hx    Malignant hyperthermia Neg Hx    Pseudochol deficiency Neg Hx    Colon cancer Neg Hx     SOCIAL HISTORY: Social History   Socioeconomic History   Marital status: Married    Spouse name: Not on file   Number of children: Not on file   Years of education: Not on file   Highest education level: Not on file  Occupational History    Not on file  Tobacco Use   Smoking status: Former    Packs/day: 1.00    Years: 20.00    Additional pack years: 0.00    Total pack years: 20.00    Types: Cigarettes    Quit date: 09/10/1984    Years since quitting: 38.5   Smokeless tobacco: Never  Vaping Use   Vaping Use: Never used  Substance and Sexual Activity   Alcohol use: No   Drug use: No   Sexual activity: Not Currently    Birth control/protection: Post-menopausal  Other Topics Concern   Not on file  Social History Narrative   Not on file   Social Determinants of Health   Financial Resource Strain: Not on file  Food Insecurity: No Food Insecurity (01/12/2023)   Hunger Vital Sign    Worried About Running Out of Food in the Last Year: Never true    Ran Out of Food in the Last Year: Never true  Transportation Needs: No Transportation Needs (01/12/2023)   PRAPARE - Hydrologist (Medical): No    Lack of Transportation (Non-Medical): No  Physical Activity: Not on file  Stress: Not on file  Social Connections: Not on file  Intimate Partner Violence: Not At Risk (01/12/2023)   Humiliation, Afraid, Rape, and Kick questionnaire    Fear of Current or Ex-Partner: No    Emotionally Abused: No    Physically Abused: No    Sexually Abused: No    PHYSICAL EXAM   GENERAL EXAM/CONSTITUTIONAL: Vitals:  Vitals:   03/12/23 1304  BP: 133/79  Pulse: (!) 58  Weight: 132 lb (59.9 kg)  Height: 4\' 10"  (1.473 m)   Body mass index is 27.59 kg/m. Wt Readings from Last 3 Encounters:  03/12/23 132 lb (59.9 kg)  03/11/23 130 lb 3.2 oz (59.1 kg)  03/03/23 128 lb 12.8 oz (58.4 kg)   Patient is in no distress; well developed, nourished and groomed; neck is supple  EYES: Visual fields full to confrontation, Extraocular movements intacts,   MUSCULOSKELETAL: Gait, strength, tone, movements noted in Neurologic exam below  NEUROLOGIC: MENTAL STATUS:      No data to display         awake, alert,  oriented to person, place but not time recent and remote memory intact normal attention and concentration language fluent, comprehension intact, naming intact fund of knowledge appropriate  CRANIAL NERVE:  2nd, 3rd, 4th, 6th - Visual fields full to confrontation, extraocular muscles intact, no nystagmus 5th - facial sensation symmetric 7th - facial strength symmetric 8th - hearing intact 9th - palate elevates symmetrically, uvula midline 11th - shoulder shrug symmetric 12th - tongue protrusion midline  MOTOR:  normal bulk and tone, Mild pronation on the LUE. There is also satelliting around the left arm.   SENSORY:  normal and symmetric to light touch  COORDINATION:  finger-nose-finger, fine finger movements normal  REFLEXES:  deep tendon reflexes present and symmetric  GAIT/STATION:  Deferred in a wheelchair      DIAGNOSTIC DATA (LABS, IMAGING, TESTING) - I reviewed patient records, labs, notes, testing and imaging myself where available.  Lab Results  Component Value Date   WBC 7.3 03/11/2023   HGB 13.9 03/11/2023   HCT 41.4 03/11/2023   MCV 88.3 03/11/2023   PLT 245 03/11/2023      Component Value Date/Time   NA 141 03/11/2023 0949   NA 139 08/02/2022 1112   K 3.4 (L) 03/11/2023 0949   CL 110 03/11/2023 0949   CO2 24 03/11/2023 0949   GLUCOSE 109 (H) 03/11/2023 0949   BUN 19 03/11/2023 0949   BUN 16 08/02/2022 1112   CREATININE 0.76 03/11/2023 0949   CALCIUM 10.0 03/11/2023 0949   PROT 6.6 03/11/2023 0949   PROT 7.0 08/02/2022 1112   ALBUMIN 4.0 03/11/2023 0949   ALBUMIN 4.8 08/02/2022 1112   AST 19 03/11/2023 0949   ALT 15 03/11/2023 0949   ALKPHOS 75 03/11/2023 0949   BILITOT 0.3 03/11/2023 0949   GFRNONAA >60 03/11/2023 0949   GFRAA >90 01/19/2014 0530   Lab Results  Component Value Date   CHOL 126 01/11/2023   HDL 42 01/11/2023   LDLCALC 61 01/11/2023   TRIG 114 01/11/2023   CHOLHDL 3.0 01/11/2023   Lab Results  Component Value Date    HGBA1C 6.0 (H) 01/11/2023   No results found for: "VITAMINB12" Lab Results  Component Value Date   TSH 0.824 02/11/2023    MRI Brain MRA Head and Neck 01/11/2023 1. Small scattered acute and subacute infarcts in the bilateral cerebral hemisphere suggesting central embolic disease. Enhancing nodule in the right frontal cortex is most likely a subacute infarct but recommend follow-up in 6-8 weeks given active malignancy. 2. No underlying stenosis or irregularity of major arteries in the head and neck.   MRI Brain 03/03/2023 1. Interval development of a now chronic appearing infarct along the right middle frontal gyrus with overlying cortical laminar necrosis. 2. Interval resolution of the previously seen focus of abnormal enhancement along the right precentral gyrus. No new foci of abnormal enhancement.   ASSESSMENT AND PLAN  77 y.o. year old female with dysphagia status post esophageal dilation, vocal cord paralysis, stage IV non-small cell lung carcinoma with extensive adenopathy who is presenting after being found to have bilateral scattered small strokes.  Stroke etiology likely cardioembolic secondary to hypercoagulability of malignancy.  She was started on apixaban 5 mg twice daily.  Plan for patient is to continue her cancer treatment, continue with apixaban and continue with physical therapy.  Per family she has improved both in  her mentation and physical activity.  She remains in a wheelchair today and has mild LUE and LLE weakness.  I have advised her to continue with aggressive physical therapy and I will see her in 6 months.  They voiced understanding.    1. Cerebrovascular accident (CVA) due to embolism of cerebral artery      Patient Instructions  Continue current medication including apixaban 5 mg twice daily Continue with physical therapy Continue to follow-up with PCP Return in 6 months or sooner if worse  No orders of the defined types were placed in this  encounter.   No orders of the defined types were placed in this encounter.   Return in about 6 months (around 09/11/2023).  I have spent a total of 50 minutes dedicated to this patient today, preparing to see patient, performing a medically appropriate examination and evaluation, ordering tests and/or medications and procedures, and counseling and educating the patient/family/caregiver; independently interpreting result and communicating results to the family/patient/caregiver; and documenting clinical information in the electronic medical record.   Alric Ran, MD 03/13/2023, 8:03 AM  [facility] [address] [place], [place] [address] [phone]

## 2023-03-12 NOTE — Patient Instructions (Signed)
Continue current medication including apixaban 5 mg twice daily Continue with physical therapy Continue to follow-up with PCP Return in 6 months or sooner if worse

## 2023-03-13 ENCOUNTER — Other Ambulatory Visit: Payer: Self-pay

## 2023-03-13 NOTE — Addendum Note (Signed)
Addended by: Dairl Ponder on: 03/13/2023 05:05 PM   Modules accepted: Orders

## 2023-03-17 ENCOUNTER — Other Ambulatory Visit: Payer: Self-pay | Admitting: Internal Medicine

## 2023-03-28 ENCOUNTER — Other Ambulatory Visit: Payer: Self-pay | Admitting: Family Medicine

## 2023-03-29 NOTE — Progress Notes (Unsigned)
[facility] OFFICE PROGRESS NOTE  [name], MD [address] [place] [address]  DIAGNOSIS: Stage IV (T1c, N3, M1 C) non-small cell lung cancer, adenocarcinoma presented with left upper lobe metastatic neoplasm presented with left lower lobe lung nodule in addition to extensive lymphadenopathy involving the neck, left hilar and mediastinal lymphadenopathy as well as upper abdomen lymph nodes and multiple subcutaneous/muscular nodules diagnosed in January 2024.   Molecular studies: KRASG12C   PD-L1 expression: 99%  PRIOR THERAPY: None  CURRENT THERAPY: Systemic chemotherapy with carboplatin for AUC of 5, Alimta 500 Mg/M2 and Keytruda 200 Mg IV every 3 weeks.  First dose is planned for December 30, 2022. Starting from cycle #2 on 02/18/23 she will be on single agent Keytruda 200 mg IV every 3 weeks due to her recent stroke and physical debility. She is status post 3 cycles of treatment.   INTERVAL HISTORY: Patricia [name] 77 y.o. female returns to the clinic today for a follow-up visit accompanied by her husband and grandson . The patient is currently being treated with single agent immunotherapy with [facility] for her lung cancer.  She is tolerating immunotherapy well.  He had a stroke in February 2024 and she is following closely with neurology. She last saw them on 4/3. She still is having some visual disturbances with double vision. She has an upcoming appointment with an eye doctor in May 2024.   Today, she denies any fever, chills, or night sweats. Her weight is stable. Her appetite is improving. She saw a member of the nutritionist team while in the infusion room at her last appointment which was helpful. She reports she has a cough "every once in awhile". Her cough is a little worse than prior but still "not bad". She has a prescription for tessalon for cough. She denies any dyspnea, chest pain, or hemoptysis. She is currently undergoing occupational therapy  and physical therapy at home twice a week. Denies any diarrhea or constipation. She has mild dry skin/itching on her chest. She recently had a restaging CT scan performed. She is here today for evaluation and to review her scan results before undergoing cycle #4.  MEDICAL HISTORY: Past Medical History:  Diagnosis Date   Depression    GERD (gastroesophageal reflux disease)    Hypercholesterolemia    IFG (impaired fasting glucose)    Nonintractable episodic headache 01/18/2023   Osteopenia    PONV (postoperative nausea and vomiting)     ALLERGIES:  is allergic to codeine.  MEDICATIONS:  Current Outpatient Medications  Medication Sig Dispense Refill   ALPRAZolam (XANAX) 0.5 MG tablet Take 1 tablet (0.5 mg total) by mouth at bedtime as needed for anxiety or sleep. TAKE (1) TABLET BY MOUTH AT BEDTIME AS NEEDED FOR SLEEP. 30 tablet 5   apixaban (ELIQUIS) 5 MG TABS tablet Take 1 tablet (5 mg total) by mouth 2 (two) times daily. 60 tablet 6   benzonatate (TESSALON) 100 MG capsule Take 1 capsule (100 mg total) by mouth 3 (three) times daily. 90 capsule 2   butalbital-acetaminophen-caffeine (FIORICET) 50-325-40 MG tablet Take 1/2 tablet by mouth every 6 (six) hours as needed for headache. 30 tablet 2   cyclobenzaprine (FLEXERIL) 5 MG tablet Take 1 tablet (5 mg total) by mouth 3 (three) times daily as needed for muscle spasms (neck/shoulder pain). (Patient not taking: Reported on 03/12/2023) 30 tablet 0   docusate sodium (COLACE) 100 MG capsule Take 1 capsule (100 mg total) by mouth  2 (two) times daily. 60 capsule 0   famotidine (PEPCID) 20 MG tablet Take 1 tablet (20 mg total) by mouth 2 (two) times daily. 60 tablet 6   hydrochlorothiazide (HYDRODIURIL) 25 MG tablet Take 1 tablet (25 mg total) by mouth daily. 30 tablet 6   lansoprazole (PREVACID) 30 MG capsule Take one po qd for acid reflux 90 capsule 1   Menthol-Methyl Salicylate (MUSCLE RUB) 10-15 % CREA Apply 1 Application topically 3 (three)  times daily to bilateral calves 85 g 0   Multiple Vitamin (MULTIVITAMIN WITH MINERALS) TABS tablet Take 1 tablet by mouth daily. (Patient not taking: Reported on 03/12/2023)     ondansetron (ZOFRAN) 8 MG tablet Take 1 tablet (8 mg total) by mouth every 8 (eight) hours as needed for nausea or vomiting. (Patient not taking: Reported on 03/03/2023) 30 tablet 2   PARoxetine (PAXIL) 40 MG tablet TAKE 1 TABLET BY MOUTH EVERY DAY IN THE MORNING (Patient taking differently: Take 40 mg by mouth in the morning.) 90 tablet 2   potassium chloride SA (KLOR-CON M) 20 MEQ tablet Take 1 tablet (20 mEq total) by mouth 3 (three) times daily. (Patient not taking: Reported on 03/12/2023) 90 tablet 1   prochlorperazine (COMPAZINE) 10 MG tablet Take 1 tablet (10 mg total) by mouth every 6 (six) hours as needed for nausea or vomiting. 30 tablet 0   rosuvastatin (CRESTOR) 20 MG tablet Take 1 tablet (20 mg total) by mouth daily. 30 tablet 6   senna-docusate (SENOKOT-S) 8.6-50 MG tablet Take 3 tablets by mouth 2 (two) times daily. (Patient not taking: Reported on 03/12/2023) 180 tablet 0   topiramate (TOPAMAX) 100 MG tablet Take 1 tablet (100 mg total) by mouth 2 (two) times daily. 60 tablet 6   No current facility-administered medications for this visit.    SURGICAL HISTORY:  Past Surgical History:  Procedure Laterality Date   ABDOMINAL HYSTERECTOMY     CATARACT EXTRACTION W/PHACO  09/12/2011   Procedure: CATARACT EXTRACTION PHACO AND INTRAOCULAR LENS PLACEMENT (IOC);  Surgeon: Gemma Payor;  Location: [facility];  Service: Ophthalmology;  Laterality: Left;  CDE: 8.91   CATARACT EXTRACTION W/PHACO  11/18/2011   Procedure: CATARACT EXTRACTION PHACO AND INTRAOCULAR LENS PLACEMENT (IOC);  Surgeon: Gemma Payor;  Location: [facility];  Service: Ophthalmology;  Laterality: Right;  CDE:10.26   COLONOSCOPY  2007   Dr. Jena Gauss: internal hemorrhoids, single anal papilla poor prep.    COLONOSCOPY N/A 03/27/2017   Rourk: Normal exam.   ECTOPIC  PREGNANCY SURGERY     removal of right tube   ESOPHAGOGASTRODUODENOSCOPY (EGD) WITH PROPOFOL N/A 09/10/2021   Procedure: ESOPHAGOGASTRODUODENOSCOPY (EGD) WITH PROPOFOL;  Surgeon: Corbin Ade, MD;  Location: [facility];  Service: Endoscopy;  Laterality: N/A;  8:15AM   MALONEY DILATION N/A 09/10/2021   Procedure: Elease Hashimoto DILATION;  Surgeon: Corbin Ade, MD;  Location: [facility];  Service: Endoscopy;  Laterality: N/A;   OVARIAN CYST REMOVAL     right   RECTOCELE REPAIR N/A 01/18/2014   Procedure: POSTERIOR REPAIR (RECTOCELE);  Surgeon: Tilda Burrow, MD;  Location: [facility];  Service: Gynecology;  Laterality: N/A;   VAGINAL HYSTERECTOMY N/A 01/18/2014   Procedure: HYSTERECTOMY VAGINAL;  Surgeon: Tilda Burrow, MD;  Location: [facility];  Service: Gynecology;  Laterality: N/A;    REVIEW OF SYSTEMS:   Review of Systems  Constitutional: Positive for improving fatigue. Negative for appetite change, chills,  fever and unexpected weight change.  HENT: Positive for hoarseness.  Negative for mouth sores, nosebleeds, sore throat and trouble swallowing.   Eyes: Positive for double vision. Negative for icterus.  Respiratory: Positive for mild intermittent cough. Negative for hemoptysis, shortness of breath and wheezing.   Cardiovascular: Negative for chest pain and leg swelling.  Gastrointestinal: Negative for abdominal pain, constipation, diarrhea, nausea and vomiting.  Genitourinary: Negative for bladder incontinence, difficulty urinating, dysuria, frequency and hematuria.   Musculoskeletal: Positive for improving gait changes due to stroke. Negative for back pain, gait problem, neck pain and neck stiffness.  Skin: Negative for itching and rash.  Neurological: Negative for dizziness, extremity weakness, gait problem, headaches, light-headedness and seizures.  Hematological: Negative for adenopathy. Does not bruise/bleed easily.  Psychiatric/Behavioral: Negative for confusion, depression and  sleep disturbance. The patient is not nervous/anxious.     PHYSICAL EXAMINATION:  There were no vitals taken for this visit.  ECOG PERFORMANCE STATUS: 2  Physical Exam  Constitutional: Oriented to person, place, and time and well-developed, well-nourished, and in no distress.  HENT:  Head: Normocephalic and atraumatic.  Mouth/Throat: Positive for hoarseness in the voice. Oropharynx is clear and moist. No oropharyngeal exudate.  Eyes: Conjunctivae are normal. Right eye exhibits no discharge. Left eye exhibits no discharge. No scleral icterus.  Neck: Normal range of motion. Neck supple.  Cardiovascular: Normal rate, regular rhythm, normal heart sounds and intact distal pulses.   Pulmonary/Chest: Effort normal and breath sounds normal. No respiratory distress. No wheezes. No rales.  Abdominal: Soft. Bowel sounds are normal. Exhibits no distension and no mass. There is no tenderness.  Musculoskeletal: Normal range of motion. Exhibits no edema.  Lymphadenopathy:    No cervical adenopathy.  Neurological: Alert and oriented to person, place, and time. Exhibits muscle wasting.  She was examined in the wheelchair.  Skin: Skin is warm and dry. No rash noted. Not diaphoretic. No erythema. No pallor.  Psychiatric: Mood, memory and judgment normal.  Vitals reviewed.  LABORATORY DATA: Lab Results  Component Value Date   WBC 7.3 03/11/2023   HGB 13.9 03/11/2023   HCT 41.4 03/11/2023   MCV 88.3 03/11/2023   PLT 245 03/11/2023      Chemistry      Component Value Date/Time   NA 141 03/11/2023 0949   NA 139 08/02/2022 1112   K 3.4 (L) 03/11/2023 0949   CL 110 03/11/2023 0949   CO2 24 03/11/2023 0949   BUN 19 03/11/2023 0949   BUN 16 08/02/2022 1112   CREATININE 0.76 03/11/2023 0949      Component Value Date/Time   CALCIUM 10.0 03/11/2023 0949   ALKPHOS 75 03/11/2023 0949   AST 19 03/11/2023 0949   ALT 15 03/11/2023 0949   BILITOT 0.3 03/11/2023 0949       RADIOGRAPHIC  STUDIES:  MR Brain W Wo Contrast  Result Date: 03/03/2023 CLINICAL DATA:  Follow-up enhancing lesion in the right frontal lobe. EXAM: MRI HEAD WITHOUT AND WITH CONTRAST TECHNIQUE: Multiplanar, multiecho pulse sequences of the brain and surrounding structures were obtained without and with intravenous contrast. CONTRAST:  5.70mL GADAVIST GADOBUTROL 1 MMOL/ML IV SOLN COMPARISON:  MRI brain 01/11/2023. FINDINGS: Brain: No acute infarct or intracranial hemorrhage. Interval development of a now chronic appearing infarct along the right middle frontal gyrus with overlying cortical laminar necrosis. Interval resolution of the previously seen focus of abnormal enhancement along the right precentral gyrus. No new foci of abnormal enhancement. Unchanged mild chronic small-vessel disease with scattered old infarcts. No hydrocephalus or extra-axial collection. No mass or midline  shift. Vascular: Normal flow voids and vessel enhancement. Skull and upper cervical spine: Normal marrow signal and enhancement. Moderate degenerative changes of the upper cervical spine. Sinuses/Orbits: Unremarkable. Other: None. IMPRESSION: 1. Interval development of a now chronic appearing infarct along the right middle frontal gyrus with overlying cortical laminar necrosis. 2. Interval resolution of the previously seen focus of abnormal enhancement along the right precentral gyrus. No new foci of abnormal enhancement. Electronically Signed   By: Orvan Falconer M.D.   On: 03/03/2023 19:57     ASSESSMENT/PLAN:  This is a very pleasant 77 year old Caucasian female diagnosed with stage IV (T1c, N3, M1 C) non-small cell lung cancer, adenocarcinoma.  The patient presented with a left upper lobe metastatic neoplasm in addition to extensive lymphadenopathy involving the neck, left hilar, mediastinal, and upper abdominal lymph nodes with multiple subcutaneous/muscular nodules.  She was diagnosed in January 2024.  Her molecular studies show she is  positive for K-ras G12 C which can be used in the second line setting.  Her PD-L1 expression is 99%    The patient is going to start systemic/palliative chemotherapy and immunotherapy with carboplatin for AUC of 5, Alimta 500 mg/m, Keytruda 200 mg IV every 3 weeks.  Her first cycle of treatment on 12/30/22.  She started on treatment with single agent immunotherapy with Keytruda 200 mg IV starting from cycle #2 due to her recent stroke.    Unfortunately, the patient was hospitalized for most of February 2024 due to embolic strokes.  She recently had a restaging CT scan. The patient was seen with Dr. Arbutus Ped. Dr. Arbutus Ped personally and independently reviewed the scan and discussed the results with the patient today. The scan showed a positive response to treatment with improvement in the lung mass, adenopathy, adrenal metastasis, and subcutaneous metastases. Labs were reviewed.  She will proceed with cycle #4 today scheduled.   She will be on Eliquis indefinitely due to her bilateral DVTs and strokes in the setting of malignancy.   She will continue to follow with neurology for her strokes. She also will continue with PT.   We will see her back for a follow up visit in 3 weeks for evaluation and repeat blood work before starting cycle #5.   She will continue to use tessalon for cough. Her scan did now show any evidence of infection.   The patient was advised to call immediately if she has any concerning symptoms in the interval. The patient voices understanding of current disease status and treatment options and is in agreement with the current care plan. All questions were answered. The patient knows to call the clinic with any problems, questions or concerns. We can certainly see the patient much sooner if necessary  No orders of the defined types were placed in this encounter.     Patricia Heide L Latasha Puskas, PA-C 03/29/23  ADDENDUM: Hematology/Oncology Attending: I had a face-to-face  encounter with the patient today.  I reviewed her record, lab, scan and recommended her care plan.  This is a very pleasant 77 years old white female with a stage IV non-small cell lung cancer, adenocarcinoma diagnosed in January 2024 with positive KRAS G12C mutation and PD-L1 expression of 99%. The patient started systemic chemotherapy initially with carboplatin, Alimta and Keytruda but after 1 cycle she was admitted to the hospital with stroke and her treatment was delayed by several weeks.  She resumed her treatment with single agent Keytruda 200 mg IV every 3 weeks status post total of 3 cycles.  The patient has been tolerating her treatment with Keytruda fairly well. She had repeat CT scan of the chest, abdomen and pelvis performed recently.  I personally and independently reviewed the scan images and discussed the results with the patient and her family.  Her scan showed interval decrease in the size of nodule of the left lower lobe and nearly complete resolution of the previously seen left hilar and AP window lymphadenopathy as well as decrease in the numerous subcutaneous soft tissue nodules scattered throughout the soft tissue of the chest, abdomen or pelvis and decrease in the size of the right adrenal metastasis indicative of good response to this treatment. I recommended for the patient to continue her current treatment with [facility] and she will proceed with cycle #4 today. I will see her back for follow-up visit in 3 weeks for evaluation before the next cycle of her treatment. The patient was advised to call immediately if she has any other concerning symptoms in the interval. The total time spent in the appointment was 30 minutes. Disclaimer: This note was dictated with voice recognition software. Similar sounding words can inadvertently be transcribed and may be missed upon review. Lajuana Matte, MD

## 2023-03-31 ENCOUNTER — Ambulatory Visit (HOSPITAL_COMMUNITY)
Admission: RE | Admit: 2023-03-31 | Discharge: 2023-03-31 | Disposition: A | Discharge status: Home / Self Care | Payer: Medicare HMO | Source: Ambulatory Visit | Attending: Physician Assistant | Admitting: Physician Assistant

## 2023-03-31 DIAGNOSIS — C3490 Malignant neoplasm of unspecified part of unspecified bronchus or lung: Secondary | ICD-10-CM | POA: Diagnosis not present

## 2023-03-31 DIAGNOSIS — C3492 Malignant neoplasm of unspecified part of left bronchus or lung: Secondary | ICD-10-CM | POA: Diagnosis not present

## 2023-03-31 MED ORDER — IOHEXOL 300 MG/ML  SOLN
100.0000 mL | Freq: Once | INTRAMUSCULAR | Status: AC | PRN
  Administered 2023-03-31: 75 mL via INTRAVENOUS

## 2023-03-31 MED ORDER — SODIUM CHLORIDE (PF) 0.9 % IJ SOLN
INTRAMUSCULAR | Status: AC
  Filled 2023-03-31: qty 50

## 2023-04-01 ENCOUNTER — Inpatient Hospital Stay (HOSPITAL_BASED_OUTPATIENT_CLINIC_OR_DEPARTMENT_OTHER): Payer: Medicare HMO | Admitting: Physician Assistant

## 2023-04-01 ENCOUNTER — Other Ambulatory Visit: Payer: Self-pay

## 2023-04-01 ENCOUNTER — Ambulatory Visit: Payer: Medicare HMO

## 2023-04-01 ENCOUNTER — Inpatient Hospital Stay: Payer: Medicare HMO

## 2023-04-01 ENCOUNTER — Other Ambulatory Visit: Payer: Medicare HMO

## 2023-04-01 VITALS — BP 127/86 | HR 68 | Temp 97.9°F | Resp 14 | Wt 132.0 lb

## 2023-04-01 VITALS — BP 114/71 | HR 66 | Temp 98.5°F | Resp 16

## 2023-04-01 DIAGNOSIS — C3492 Malignant neoplasm of unspecified part of left bronchus or lung: Secondary | ICD-10-CM | POA: Diagnosis not present

## 2023-04-01 DIAGNOSIS — Z8673 Personal history of transient ischemic attack (TIA), and cerebral infarction without residual deficits: Secondary | ICD-10-CM | POA: Diagnosis not present

## 2023-04-01 DIAGNOSIS — C3412 Malignant neoplasm of upper lobe, left bronchus or lung: Secondary | ICD-10-CM | POA: Diagnosis not present

## 2023-04-01 DIAGNOSIS — Z7901 Long term (current) use of anticoagulants: Secondary | ICD-10-CM | POA: Diagnosis not present

## 2023-04-01 DIAGNOSIS — Z86718 Personal history of other venous thrombosis and embolism: Secondary | ICD-10-CM | POA: Diagnosis not present

## 2023-04-01 DIAGNOSIS — Z5112 Encounter for antineoplastic immunotherapy: Secondary | ICD-10-CM | POA: Diagnosis not present

## 2023-04-01 DIAGNOSIS — C778 Secondary and unspecified malignant neoplasm of lymph nodes of multiple regions: Secondary | ICD-10-CM | POA: Diagnosis not present

## 2023-04-01 LAB — CMP (CANCER CENTER ONLY)
ALT: 33 U/L (ref 0–44)
AST: 30 U/L (ref 15–41)
Albumin: 4.2 g/dL (ref 3.5–5.0)
Alkaline Phosphatase: 84 U/L (ref 38–126)
Anion gap: 8 (ref 5–15)
BUN: 22 mg/dL (ref 8–23)
CO2: 25 mmol/L (ref 22–32)
Calcium: 10.4 mg/dL — ABNORMAL HIGH (ref 8.9–10.3)
Chloride: 108 mmol/L (ref 98–111)
Creatinine: 0.92 mg/dL (ref 0.44–1.00)
GFR, Estimated: >60 mL/min (ref >60)
Glucose, Bld: 88 mg/dL (ref 70–99)
Potassium: 3.9 mmol/L (ref 3.5–5.1)
Sodium: 141 mmol/L (ref 135–145)
Total Bilirubin: 0.3 mg/dL (ref 0.3–1.2)
Total Protein: 6.8 g/dL (ref 6.5–8.1)

## 2023-04-01 LAB — CBC WITH DIFFERENTIAL (CANCER CENTER ONLY)
Abs Immature Granulocytes: 0.01 10*3/uL (ref 0.00–0.07)
Basophils Absolute: 0.1 10*3/uL (ref 0.0–0.1)
Basophils Relative: 1 %
Eosinophils Absolute: 0.2 10*3/uL (ref 0.0–0.5)
Eosinophils Relative: 2 %
HCT: 43.2 % (ref 36.0–46.0)
Hemoglobin: 14.2 g/dL (ref 12.0–15.0)
Immature Granulocytes: 0 %
Lymphocytes Relative: 45 %
Lymphs Abs: 3.1 10*3/uL (ref 0.7–4.0)
MCH: 29.6 pg (ref 26.0–34.0)
MCHC: 32.9 g/dL (ref 30.0–36.0)
MCV: 90.2 fL (ref 80.0–100.0)
Monocytes Absolute: 0.4 10*3/uL (ref 0.1–1.0)
Monocytes Relative: 5 %
Neutro Abs: 3.2 10*3/uL (ref 1.7–7.7)
Neutrophils Relative %: 47 %
Platelet Count: 250 10*3/uL (ref 150–400)
RBC: 4.79 MIL/uL (ref 3.87–5.11)
RDW: 14.8 % (ref 11.5–15.5)
WBC Count: 7 10*3/uL (ref 4.0–10.5)
nRBC: 0 % (ref 0.0–0.2)

## 2023-04-01 MED ORDER — SODIUM CHLORIDE 0.9 % IV SOLN: Freq: Once | INTRAVENOUS | Status: AC

## 2023-04-01 MED ORDER — SODIUM CHLORIDE 0.9 % IV SOLN
200.0000 mg | Freq: Once | INTRAVENOUS | Status: AC
  Administered 2023-04-01: 200 mg via INTRAVENOUS
  Filled 2023-04-01: qty 200

## 2023-04-01 NOTE — Patient Instructions (Signed)
[facility]  Discharge Instructions: Thank you for choosing [facility] to provide your oncology and hematology care.   If you have a lab appointment with the Cancer Center, please go directly to the Cancer Center and check in at the registration area.   Wear comfortable clothing and clothing appropriate for easy access to any Portacath or PICC line.   We strive to give you quality time with your provider. You may need to reschedule your appointment if you arrive late (15 or more minutes).  Arriving late affects you and other patients whose appointments are after yours.  Also, if you miss three or more appointments without notifying the office, you may be dismissed from the clinic at the provider's discretion.      For prescription refill requests, have your pharmacy contact our office and allow 72 hours for refills to be completed.    Today you received the following chemotherapy and/or immunotherapy agent: Pembrolizumab (Keytruda)   To help prevent nausea and vomiting after your treatment, we encourage you to take your nausea medication as directed.  BELOW ARE SYMPTOMS THAT SHOULD BE REPORTED IMMEDIATELY: *FEVER GREATER THAN 100.4 F (38 C) OR HIGHER *CHILLS OR SWEATING *NAUSEA AND VOMITING THAT IS NOT CONTROLLED WITH YOUR NAUSEA MEDICATION *UNUSUAL SHORTNESS OF BREATH *UNUSUAL BRUISING OR BLEEDING *URINARY PROBLEMS (pain or burning when urinating, or frequent urination) *BOWEL PROBLEMS (unusual diarrhea, constipation, pain near the anus) TENDERNESS IN MOUTH AND THROAT WITH OR WITHOUT PRESENCE OF ULCERS (sore throat, sores in mouth, or a toothache) UNUSUAL RASH, SWELLING OR PAIN  UNUSUAL VAGINAL DISCHARGE OR ITCHING   Items with * indicate a potential emergency and should be followed up as soon as possible or go to the Emergency Department if any problems should occur.  Please show the CHEMOTHERAPY ALERT CARD or IMMUNOTHERAPY ALERT  CARD at check-in to the Emergency Department and triage nurse.  Should you have questions after your visit or need to cancel or reschedule your appointment, please contact [facility]  Dept: [phone]  and follow the prompts.  Office hours are 8:00 a.m. to 4:30 p.m. Monday - Friday. Please note that voicemails left after 4:00 p.m. may not be returned until the following business day.  We are closed weekends and major holidays. You have access to a nurse at all times for urgent questions. Please call the main number to the clinic Dept: [phone] and follow the prompts.   For any non-urgent questions, you may also contact your provider using MyChart. We now offer e-Visits for anyone 18 and older to request care online for non-urgent symptoms. For details visit [email or web address].   Also download the MyChart app! Go to the app store, search "MyChart", open the app, select [facility], and log in with your MyChart username and password.  Pembrolizumab Injection What is this medication? PEMBROLIZUMAB (PEM broe LIZ ue mab) treats some types of cancer. It works by helping your immune system slow or stop the spread of cancer cells. It is a monoclonal antibody. This medicine may be used for other purposes; ask your health care provider or pharmacist if you have questions. COMMON BRAND NAME(S): Keytruda What should I tell my care team before I take this medication? They need to know if you have any of these conditions: Allogeneic stem cell transplant (uses someone else's stem cells) Autoimmune diseases, such as Crohn disease, ulcerative colitis, lupus History of chest radiation Nervous system problems,   such as Guillain-Barre syndrome, myasthenia gravis Organ transplant An unusual or allergic reaction to pembrolizumab, other medications, foods, dyes, or preservatives Pregnant or trying to get pregnant Breast-feeding How should I use this medication? This  medication is injected into a vein. It is given by your care team in a hospital or clinic setting. A special MedGuide will be given to you before each treatment. Be sure to read this information carefully each time. Talk to your care team about the use of this medication in children. While it may be prescribed for children as young as 6 months for selected conditions, precautions do apply. Overdosage: If you think you have taken too much of this medicine contact a poison control center or emergency room at once. NOTE: This medicine is only for you. Do not share this medicine with others. What if I miss a dose? Keep appointments for follow-up doses. It is important not to miss your dose. Call your care team if you are unable to keep an appointment. What may interact with this medication? Interactions have not been studied. This list may not describe all possible interactions. Give your health care provider a list of all the medicines, herbs, non-prescription drugs, or dietary supplements you use. Also tell them if you smoke, drink alcohol, or use illegal drugs. Some items may interact with your medicine. What should I watch for while using this medication? Your condition will be monitored carefully while you are receiving this medication. You may need blood work while taking this medication. This medication may cause serious skin reactions. They can happen weeks to months after starting the medication. Contact your care team right away if you notice fevers or flu-like symptoms with a rash. The rash may be red or purple and then turn into blisters or peeling of the skin. You may also notice a red rash with swelling of the face, lips, or lymph nodes in your neck or under your arms. Tell your care team right away if you have any change in your eyesight. Talk to your care team if you may be pregnant. Serious birth defects can occur if you take this medication during pregnancy and for 4 months after the last  dose. You will need a negative pregnancy test before starting this medication. Contraception is recommended while taking this medication and for 4 months after the last dose. Your care team can help you find the option that works for you. Do not breastfeed while taking this medication and for 4 months after the last dose. What side effects may I notice from receiving this medication? Side effects that you should report to your care team as soon as possible: Allergic reactions--skin rash, itching, hives, swelling of the face, lips, tongue, or throat Dry cough, shortness of breath or trouble breathing Eye pain, redness, irritation, or discharge with blurry or decreased vision Heart muscle inflammation--unusual weakness or fatigue, shortness of breath, chest pain, fast or irregular heartbeat, dizziness, swelling of the ankles, feet, or hands Hormone gland problems--headache, sensitivity to light, unusual weakness or fatigue, dizziness, fast or irregular heartbeat, increased sensitivity to cold or heat, excessive sweating, constipation, hair loss, increased thirst or amount of urine, tremors or shaking, irritability Infusion reactions--chest pain, shortness of breath or trouble breathing, feeling faint or lightheaded Kidney injury (glomerulonephritis)--decrease in the amount of urine, red or dark brown urine, foamy or bubbly urine, swelling of the ankles, hands, or feet Liver injury--right upper belly pain, loss of appetite, nausea, light-colored stool, dark yellow or brown urine,   yellowing skin or eyes, unusual weakness or fatigue Pain, tingling, or numbness in the hands or feet, muscle weakness, change in vision, confusion or trouble speaking, loss of balance or coordination, trouble walking, seizures Rash, fever, and swollen lymph nodes Redness, blistering, peeling, or loosening of the skin, including inside the mouth Sudden or severe stomach pain, bloody diarrhea, fever, nausea, vomiting Side effects  that usually do not require medical attention (report to your care team if they continue or are bothersome): Bone, joint, or muscle pain Diarrhea Fatigue Loss of appetite Nausea Skin rash This list may not describe all possible side effects. Call your doctor for medical advice about side effects. You may report side effects to FDA at 1-800-FDA-1088. Where should I keep my medication? This medication is given in a hospital or clinic. It will not be stored at home. NOTE: This sheet is a summary. It may not cover all possible information. If you have questions about this medicine, talk to your doctor, pharmacist, or health care provider.  2023 Elsevier/Gold Standard (2022-04-09 00:00:00)   

## 2023-04-13 ENCOUNTER — Other Ambulatory Visit: Payer: Self-pay | Admitting: Family Medicine

## 2023-04-14 NOTE — Telephone Encounter (Signed)
Please talk with patient-let her know when she was in the hospital they stopped this medication-current according to the current medication list she is on HCTZ. (If she is taking the valsartan HCTZ and taking HCTZ she could be taking too much diuretic which could cause kidney issues It is quite possible this is a automatic refill request rather than the patient requesting this but please try to clarify the situation let me know then we can go from there if we need to bring her in for office visit sooner than the 1 in July and have her bring in all of her medicines to review we can do so)

## 2023-04-14 NOTE — Progress Notes (Signed)
[facility] OFFICE PROGRESS NOTE  Patricia Sciara, Patricia [name] [address] [place] [address]  DIAGNOSIS: Stage IV (T1c, N3, M1 C) non-small cell lung cancer, adenocarcinoma presented with left upper lobe metastatic neoplasm presented with left lower lobe lung nodule in addition to extensive lymphadenopathy involving the neck, left hilar and mediastinal lymphadenopathy as well as upper abdomen lymph nodes and multiple subcutaneous/muscular nodules diagnosed in January 2024.   Molecular studies: [id number]   PD-L1 expression: 99%  PRIOR THERAPY: None  CURRENT THERAPY: Systemic chemotherapy with carboplatin for AUC of 5, Alimta 500 Mg/M2 and Keytruda 200 Mg IV every 3 weeks.  First dose is planned for December 30, 2022. Starting from cycle #2 on 02/18/23 she will be on single agent Keytruda 200 mg IV every 3 weeks due to her recent stroke and physical debility. She is status post 4 cycles of treatment.   INTERVAL HISTORY: Patricia [name] 77 y.o. female returns to the clinic today for a follow-up visit accompanied by her husband and grandson. The patient is currently being treated with single agent immunotherapy with [facility] for her lung cancer.  She is tolerating immunotherapy well.  He had a stroke in February 2024 and she is following with neurology. She last saw them on 4/3. She still is having some visual disturbances with double vision. She recently had an eye doctor in May 2024 and they told her her vision should improve in time. She completed PT and OT for her stroke. She is going to reach out to her PCP to see if they can extend her PT. She states she has all the assistive devices she needs at home.    Today, she denies any fever, chills, or night sweats. Her weight is stable. Her appetite is improving. She previously saw a member of the nutritionist team. She has a prescription for tessalon for cough if needed but overall she denies significant cough. She denies any  dyspnea, chest pain, or hemoptysis. She denies nausea/vomiting. Denies any diarrhea or constipation. She has mild dry skin but denies rash. She is here today for evaluation and repeat blood work before undergoing cycle #5.   MEDICAL HISTORY: Past Medical History:  Diagnosis Date   Depression    GERD (gastroesophageal reflux disease)    Hypercholesterolemia    IFG (impaired fasting glucose)    Nonintractable episodic headache 01/18/2023   Osteopenia    PONV (postoperative nausea and vomiting)     ALLERGIES:  is allergic to codeine.  MEDICATIONS:  Current Outpatient Medications  Medication Sig Dispense Refill   ALPRAZolam (XANAX) 0.5 MG tablet Take 1 tablet (0.5 mg total) by mouth at bedtime as needed for anxiety or sleep. TAKE (1) TABLET BY MOUTH AT BEDTIME AS NEEDED FOR SLEEP. 30 tablet 5   apixaban (ELIQUIS) 5 MG TABS tablet Take 1 tablet (5 mg total) by mouth 2 (two) times daily. 60 tablet 6   benzonatate (TESSALON) 100 MG capsule Take 1 capsule (100 mg total) by mouth 3 (three) times daily. 90 capsule 2   butalbital-acetaminophen-caffeine (FIORICET) 50-325-40 MG tablet TAKE 1/2 TABLET BY MOUTH EVERY 6 (SIX) HOURS AS NEEDED FOR HEADACHE. 30 tablet 2   docusate sodium (COLACE) 100 MG capsule Take 1 capsule (100 mg total) by mouth 2 (two) times daily. 60 capsule 0   famotidine (PEPCID) 20 MG tablet Take 1 tablet (20 mg total) by mouth 2 (two) times daily. 60 tablet 6   hydrochlorothiazide (HYDRODIURIL) 25 MG tablet Take  1 tablet (25 mg total) by mouth daily. 30 tablet 6   KLOR-CON M20 20 MEQ tablet TAKE 1 TABLET BY MOUTH 3 TIMES DAILY. 270 tablet 1   lansoprazole (PREVACID) 30 MG capsule Take one po qd for acid reflux 90 capsule 1   Menthol-Methyl Salicylate (MUSCLE RUB) 10-15 % CREA Apply 1 Application topically 3 (three) times daily to bilateral calves 85 g 0   PARoxetine (PAXIL) 40 MG tablet TAKE 1 TABLET BY MOUTH EVERY DAY IN THE MORNING (Patient taking differently: Take 40 mg by  mouth in the morning.) 90 tablet 2   prochlorperazine (COMPAZINE) 10 MG tablet Take 1 tablet (10 mg total) by mouth every 6 (six) hours as needed for nausea or vomiting. 30 tablet 0   rosuvastatin (CRESTOR) 20 MG tablet Take 1 tablet (20 mg total) by mouth daily. 30 tablet 6   topiramate (TOPAMAX) 100 MG tablet Take 1 tablet (100 mg total) by mouth 2 (two) times daily. 60 tablet 6   cyclobenzaprine (FLEXERIL) 5 MG tablet Take 1 tablet (5 mg total) by mouth 3 (three) times daily as needed for muscle spasms (neck/shoulder pain). (Patient not taking: Reported on 03/12/2023) 30 tablet 0   Multiple Vitamin (MULTIVITAMIN WITH MINERALS) TABS tablet Take 1 tablet by mouth daily. (Patient not taking: Reported on 03/12/2023)     ondansetron (ZOFRAN) 8 MG tablet Take 1 tablet (8 mg total) by mouth every 8 (eight) hours as needed for nausea or vomiting. (Patient not taking: Reported on 03/03/2023) 30 tablet 2   senna-docusate (SENOKOT-S) 8.6-50 MG tablet Take 3 tablets by mouth 2 (two) times daily. (Patient not taking: Reported on 03/12/2023) 180 tablet 0   No current facility-administered medications for this visit.    SURGICAL HISTORY:  Past Surgical History:  Procedure Laterality Date   ABDOMINAL HYSTERECTOMY     CATARACT EXTRACTION W/PHACO  09/12/2011   Procedure: CATARACT EXTRACTION PHACO AND INTRAOCULAR LENS PLACEMENT (IOC);  Surgeon: Gemma Payor;  Location: [facility];  Service: Ophthalmology;  Laterality: Left;  CDE: 8.91   CATARACT EXTRACTION W/PHACO  11/18/2011   Procedure: CATARACT EXTRACTION PHACO AND INTRAOCULAR LENS PLACEMENT (IOC);  Surgeon: Gemma Payor;  Location: [facility];  Service: Ophthalmology;  Laterality: Right;  CDE:10.26   COLONOSCOPY  2007   Dr. Jena Gauss: internal hemorrhoids, single anal papilla poor prep.    COLONOSCOPY N/A 03/27/2017   Rourk: Normal exam.   ECTOPIC PREGNANCY SURGERY     removal of right tube   ESOPHAGOGASTRODUODENOSCOPY (EGD) WITH PROPOFOL N/A 09/10/2021   Procedure:  ESOPHAGOGASTRODUODENOSCOPY (EGD) WITH PROPOFOL;  Surgeon: Corbin Ade, Patricia [name];  Location: [facility];  Service: Endoscopy;  Laterality: N/A;  8:15AM   MALONEY DILATION N/A 09/10/2021   Procedure: Elease Hashimoto DILATION;  Surgeon: Corbin Ade, Patricia [name];  Location: [facility];  Service: Endoscopy;  Laterality: N/A;   OVARIAN CYST REMOVAL     right   RECTOCELE REPAIR N/A 01/18/2014   Procedure: POSTERIOR REPAIR (RECTOCELE);  Surgeon: Tilda Burrow, Patricia [name];  Location: [facility];  Service: Gynecology;  Laterality: N/A;   VAGINAL HYSTERECTOMY N/A 01/18/2014   Procedure: HYSTERECTOMY VAGINAL;  Surgeon: Tilda Burrow, Patricia [name];  Location: [facility];  Service: Gynecology;  Laterality: N/A;    REVIEW OF SYSTEMS:   Review of Systems  Constitutional: Positive for improving fatigue. Negative for appetite change, chills,  fever and unexpected weight change.  HENT: Positive for hoarseness. Negative for mouth sores, nosebleeds, sore throat and trouble swallowing.   Eyes: Positive for  double vision left eye. Negative for icterus.  Respiratory: Negative for hemoptysis, cough, shortness of breath and wheezing.   Cardiovascular: Negative for chest pain and leg swelling.  Gastrointestinal: Negative for abdominal pain, constipation, diarrhea, nausea and vomiting.  Genitourinary: Negative for bladder incontinence, difficulty urinating, dysuria, frequency and hematuria.   Musculoskeletal: Positive for gait changes due to hx of stroke. Negative for back pain, neck pain and neck stiffness.  Skin: Negative for itching and rash.  Neurological: Positive for lower extremity weakness in left leg due to history of stroke. Positive for occasional dizziness.  Negative for headaches, light-headedness and seizures.  Hematological: Negative for adenopathy. Does not bruise/bleed easily.  Psychiatric/Behavioral: Negative for confusion, depression and sleep disturbance. The patient is not nervous/anxious   PHYSICAL EXAMINATION:  Blood  pressure 117/78, pulse 65, temperature 98.4 F (36.9 C), resp. rate 18, weight 133 lb 9.6 oz (60.6 kg), SpO2 97 %.  ECOG PERFORMANCE STATUS: 2  Physical Exam  Constitutional: Oriented to person, place, and time and well-developed, well-nourished, and in no distress.  HENT:  Head: Normocephalic and atraumatic.  Mouth/Throat: Positive for hoarseness in the voice. Oropharynx is clear and moist. No oropharyngeal exudate.  Eyes: Conjunctivae are normal. Right eye exhibits no discharge. Left eye exhibits no discharge. No scleral icterus.  Neck: Normal range of motion. Neck supple.  Cardiovascular: Normal rate, regular rhythm, normal heart sounds and intact distal pulses.   Pulmonary/Chest: Effort normal and breath sounds normal. No respiratory distress. No wheezes. No rales.  Abdominal: Soft. Bowel sounds are normal. Exhibits no distension and no mass. There is no tenderness.  Musculoskeletal: Normal range of motion. Exhibits no edema.  Lymphadenopathy:    No cervical adenopathy.  Neurological: Alert and oriented to person, place, and time. Exhibits muscle wasting.  She was examined in the wheelchair.  Skin: Skin is warm and dry. No rash noted. Not diaphoretic. No erythema. No pallor.  Psychiatric: Mood, memory and judgment normal.  Vitals reviewed.  LABORATORY DATA: Lab Results  Component Value Date   WBC 6.5 04/22/2023   HGB 14.2 04/22/2023   HCT 43.8 04/22/2023   MCV 92.4 04/22/2023   PLT 200 04/22/2023      Chemistry      Component Value Date/Time   NA 141 04/22/2023 0851   NA 139 08/02/2022 1112   K 3.9 04/22/2023 0851   CL 109 04/22/2023 0851   CO2 24 04/22/2023 0851   BUN 18 04/22/2023 0851   BUN 16 08/02/2022 1112   CREATININE 0.97 04/22/2023 0851      Component Value Date/Time   CALCIUM 9.7 04/22/2023 0851   ALKPHOS 86 04/22/2023 0851   AST 28 04/22/2023 0851   ALT 34 04/22/2023 0851   BILITOT 0.3 04/22/2023 0851       RADIOGRAPHIC STUDIES:  CT CHEST  ABDOMEN PELVIS W CONTRAST  Result Date: 04/01/2023 CLINICAL DATA:  Metastatic lung cancer, assess treatment response * Tracking Code: BO * EXAM: CT CHEST, ABDOMEN, AND PELVIS WITH CONTRAST TECHNIQUE: Multidetector CT imaging of the chest, abdomen and pelvis was performed following the standard protocol during bolus administration of intravenous contrast. RADIATION DOSE REDUCTION: This exam was performed according to the departmental dose-optimization program which includes automated exposure control, adjustment of the mA and/or kV according to patient size and/or use of iterative reconstruction technique. CONTRAST:  75mL OMNIPAQUE IOHEXOL 300 MG/ML  SOLN COMPARISON:  PET-CT, 12/06/2022 FINDINGS: CT CHEST FINDINGS Cardiovascular: Aortic atherosclerosis. Normal heart size. No pericardial effusion. Mediastinum/Nodes: Nearly complete resolution  of previously seen left hilar and AP window lymphadenopathy, with residual treated soft tissue in this vicinity but no measurable enlarged lymph nodes (series 2, image 18). No persistently enlarged lymph nodes in the chest. Moderate hiatal hernia with intrathoracic position of the gastric fundus. Thyroid gland, trachea, and esophagus demonstrate no significant findings. Lungs/Pleura: Mild paraseptal emphysema. Interval decrease in size of a nodule of the left lower lobe, measuring 1.5 x 1.0 cm, previously 2.3 x 1.6 cm (series 7, image 66). No pleural effusion or pneumothorax. Musculoskeletal: Numerous subcutaneous soft tissue nodules scattered throughout the soft tissues of the chest, abdomen, and pelvis on prior examination are almost completely resolved, with minimal residual of the largest nodules remaining, for example in the upper left breast, measuring 0.7 cm, previously 1.6 x 1.3 cm (series 2, image 19). No acute osseous findings. CT ABDOMEN PELVIS FINDINGS Hepatobiliary: No solid liver abnormality is seen. No gallstones, gallbladder wall thickening, or biliary  dilatation. Pancreas: Unremarkable. No pancreatic ductal dilatation or surrounding inflammatory changes. Spleen: Normal in size without significant abnormality. Adrenals/Urinary Tract: Interval decrease in size of a right adrenal nodule, measuring 1.4 x 0.6 cm, previously 2.1 x 1.4 cm (series 2, image 44). Kidneys are normal, without renal calculi, solid lesion, or hydronephrosis. Bladder is unremarkable. Stomach/Bowel: Stomach is within normal limits. Appendix appears normal. No evidence of bowel wall thickening, distention, or inflammatory changes. Vascular/Lymphatic: Aortic atherosclerosis. No enlarged abdominal or pelvic lymph nodes. Reproductive: Status post hysterectomy. Other: No abdominal wall hernia or abnormality. No ascites. Musculoskeletal: No acute osseous findings. IMPRESSION: 1. Interval decrease in size of a nodule of the left lower lobe. 2. Nearly complete resolution of previously seen left hilar and AP window lymphadenopathy, with residual treated soft tissue in this vicinity but no measurable enlarged lymph nodes. No persistently enlarged lymph nodes in the chest. 3. Numerous subcutaneous soft tissue nodules scattered throughout the soft tissues of the chest, abdomen, and pelvis on prior examination are almost completely resolved, with minimal residua of the largest nodules remaining. 4. Interval decrease in size of a right adrenal metastasis. 5. Findings are consistent with treatment response of primary lung malignancy and metastatic disease. 6. Emphysema. 7. Hiatal hernia. Aortic Atherosclerosis (ICD10-I70.0) and Emphysema (ICD10-J43.9). Electronically Signed   By: Jearld Lesch M.D.   On: 04/01/2023 15:45     ASSESSMENT/PLAN:  This is a very pleasant 77 year old Caucasian female diagnosed with stage IV (T1c, N3, M1 C) non-small cell lung cancer, adenocarcinoma.  The patient presented with a left upper lobe metastatic neoplasm in addition to extensive lymphadenopathy involving the neck, left  hilar, mediastinal, and upper abdominal lymph nodes with multiple subcutaneous/muscular nodules.  She was diagnosed in January 2024.  Her molecular studies show she is positive for K-ras G12 C which can be used in the second line setting.  Her PD-L1 expression is 99%    The patient is going to start systemic/palliative chemotherapy and immunotherapy with carboplatin for AUC of 5, Alimta 500 mg/m, Keytruda 200 mg IV every 3 weeks.  Her first cycle of treatment on 12/30/22.  She started on treatment with single agent immunotherapy with Keytruda 200 mg IV starting from cycle #2 due to her recent stroke.    Labs were reviewed. Recommend that she proceed with cycle #5 today as scheduled  She will be on Eliquis indefinitely due to her bilateral DVTs and strokes in the setting of malignancy.   She will continue to follow with neurology for her strokes. She will reach out  to her PCP to extend her PT. She states she has all of her assistive devices she needs.    We will see her back for a follow up visit in 3 weeks for evaluation and repeat blood work before starting cycle #6.    She will continue to use tessalon for cough. Overall, her cough is very mild and she does not cough often.   The patient was advised to call immediately if she has any concerning symptoms in the interval. The patient voices understanding of current disease status and treatment options and is in agreement with the current care plan. All questions were answered. The patient knows to call the clinic with any problems, questions or concerns. We can certainly see the patient much sooner if necessary     No orders of the defined types were placed in this encounter.    The total time spent in the appointment was 20-29 minutes.   Everley Evora L Jayana Kotula, PA-C 04/22/23

## 2023-04-16 ENCOUNTER — Other Ambulatory Visit: Payer: Self-pay | Admitting: Family Medicine

## 2023-04-18 ENCOUNTER — Telehealth: Payer: Self-pay | Admitting: Medical Oncology

## 2023-04-18 NOTE — Telephone Encounter (Signed)
Faxed recent office note to [facility].

## 2023-04-19 DIAGNOSIS — Z823 Family history of stroke: Secondary | ICD-10-CM | POA: Diagnosis not present

## 2023-04-19 DIAGNOSIS — E785 Hyperlipidemia, unspecified: Secondary | ICD-10-CM | POA: Diagnosis not present

## 2023-04-19 DIAGNOSIS — N182 Chronic kidney disease, stage 2 (mild): Secondary | ICD-10-CM | POA: Diagnosis not present

## 2023-04-19 DIAGNOSIS — K219 Gastro-esophageal reflux disease without esophagitis: Secondary | ICD-10-CM | POA: Diagnosis not present

## 2023-04-19 DIAGNOSIS — Z809 Family history of malignant neoplasm, unspecified: Secondary | ICD-10-CM | POA: Diagnosis not present

## 2023-04-19 DIAGNOSIS — F419 Anxiety disorder, unspecified: Secondary | ICD-10-CM | POA: Diagnosis not present

## 2023-04-19 DIAGNOSIS — Z8249 Family history of ischemic heart disease and other diseases of the circulatory system: Secondary | ICD-10-CM | POA: Diagnosis not present

## 2023-04-19 DIAGNOSIS — K5900 Constipation, unspecified: Secondary | ICD-10-CM | POA: Diagnosis not present

## 2023-04-19 DIAGNOSIS — I69328 Other speech and language deficits following cerebral infarction: Secondary | ICD-10-CM | POA: Diagnosis not present

## 2023-04-19 DIAGNOSIS — G43909 Migraine, unspecified, not intractable, without status migrainosus: Secondary | ICD-10-CM | POA: Diagnosis not present

## 2023-04-19 DIAGNOSIS — I129 Hypertensive chronic kidney disease with stage 1 through stage 4 chronic kidney disease, or unspecified chronic kidney disease: Secondary | ICD-10-CM | POA: Diagnosis not present

## 2023-04-19 DIAGNOSIS — Z87891 Personal history of nicotine dependence: Secondary | ICD-10-CM | POA: Diagnosis not present

## 2023-04-21 NOTE — Telephone Encounter (Signed)
Unable to verify with patient, spoke with pharmacist last fill date was 12/29/22.

## 2023-04-22 ENCOUNTER — Other Ambulatory Visit: Payer: Self-pay

## 2023-04-22 ENCOUNTER — Inpatient Hospital Stay (HOSPITAL_BASED_OUTPATIENT_CLINIC_OR_DEPARTMENT_OTHER): Payer: Medicare HMO | Admitting: Physician Assistant

## 2023-04-22 ENCOUNTER — Inpatient Hospital Stay: Payer: Medicare HMO | Attending: Internal Medicine

## 2023-04-22 ENCOUNTER — Inpatient Hospital Stay: Payer: Medicare HMO

## 2023-04-22 ENCOUNTER — Other Ambulatory Visit: Payer: Self-pay | Admitting: Internal Medicine

## 2023-04-22 VITALS — BP 111/70 | HR 60 | Temp 98.9°F | Resp 16

## 2023-04-22 VITALS — BP 117/78 | HR 65 | Temp 98.4°F | Resp 18 | Wt 133.6 lb

## 2023-04-22 DIAGNOSIS — C3412 Malignant neoplasm of upper lobe, left bronchus or lung: Secondary | ICD-10-CM | POA: Diagnosis not present

## 2023-04-22 DIAGNOSIS — Z8673 Personal history of transient ischemic attack (TIA), and cerebral infarction without residual deficits: Secondary | ICD-10-CM | POA: Diagnosis not present

## 2023-04-22 DIAGNOSIS — C3492 Malignant neoplasm of unspecified part of left bronchus or lung: Secondary | ICD-10-CM | POA: Diagnosis not present

## 2023-04-22 DIAGNOSIS — Z86718 Personal history of other venous thrombosis and embolism: Secondary | ICD-10-CM | POA: Diagnosis not present

## 2023-04-22 DIAGNOSIS — Z5112 Encounter for antineoplastic immunotherapy: Secondary | ICD-10-CM | POA: Insufficient documentation

## 2023-04-22 DIAGNOSIS — Z7901 Long term (current) use of anticoagulants: Secondary | ICD-10-CM | POA: Diagnosis not present

## 2023-04-22 DIAGNOSIS — C7971 Secondary malignant neoplasm of right adrenal gland: Secondary | ICD-10-CM | POA: Diagnosis not present

## 2023-04-22 DIAGNOSIS — C3490 Malignant neoplasm of unspecified part of unspecified bronchus or lung: Secondary | ICD-10-CM

## 2023-04-22 DIAGNOSIS — Z79620 Long term (current) use of immunosuppressive biologic: Secondary | ICD-10-CM | POA: Insufficient documentation

## 2023-04-22 LAB — CBC WITH DIFFERENTIAL (CANCER CENTER ONLY)
Abs Immature Granulocytes: 0.02 10*3/uL (ref 0.00–0.07)
Basophils Absolute: 0.1 10*3/uL (ref 0.0–0.1)
Basophils Relative: 2 %
Eosinophils Absolute: 0.2 10*3/uL (ref 0.0–0.5)
Eosinophils Relative: 3 %
HCT: 43.8 % (ref 36.0–46.0)
Hemoglobin: 14.2 g/dL (ref 12.0–15.0)
Immature Granulocytes: 0 %
Lymphocytes Relative: 41 %
Lymphs Abs: 2.7 10*3/uL (ref 0.7–4.0)
MCH: 30 pg (ref 26.0–34.0)
MCHC: 32.4 g/dL (ref 30.0–36.0)
MCV: 92.4 fL (ref 80.0–100.0)
Monocytes Absolute: 0.4 10*3/uL (ref 0.1–1.0)
Monocytes Relative: 5 %
Neutro Abs: 3.2 10*3/uL (ref 1.7–7.7)
Neutrophils Relative %: 49 %
Platelet Count: 200 10*3/uL (ref 150–400)
RBC: 4.74 MIL/uL (ref 3.87–5.11)
RDW: 14.2 % (ref 11.5–15.5)
WBC Count: 6.5 10*3/uL (ref 4.0–10.5)
nRBC: 0 % (ref 0.0–0.2)

## 2023-04-22 LAB — CMP (CANCER CENTER ONLY)
ALT: 34 U/L (ref 0–44)
AST: 28 U/L (ref 15–41)
Albumin: 4.3 g/dL (ref 3.5–5.0)
Alkaline Phosphatase: 86 U/L (ref 38–126)
Anion gap: 8 (ref 5–15)
BUN: 18 mg/dL (ref 8–23)
CO2: 24 mmol/L (ref 22–32)
Calcium: 9.7 mg/dL (ref 8.9–10.3)
Chloride: 109 mmol/L (ref 98–111)
Creatinine: 0.97 mg/dL (ref 0.44–1.00)
GFR, Estimated: >60 mL/min (ref >60)
Glucose, Bld: 103 mg/dL — ABNORMAL HIGH (ref 70–99)
Potassium: 3.9 mmol/L (ref 3.5–5.1)
Sodium: 141 mmol/L (ref 135–145)
Total Bilirubin: 0.3 mg/dL (ref 0.3–1.2)
Total Protein: 6.7 g/dL (ref 6.5–8.1)

## 2023-04-22 MED ORDER — PROCHLORPERAZINE MALEATE 10 MG PO TABS
10.0000 mg | ORAL_TABLET | Freq: Once | ORAL | Status: DC
Start: 2023-04-22 — End: 2023-04-22

## 2023-04-22 MED ORDER — SODIUM CHLORIDE 0.9 % IV SOLN
200.0000 mg | Freq: Once | INTRAVENOUS | Status: AC
  Administered 2023-04-22: 200 mg via INTRAVENOUS
  Filled 2023-04-22: qty 200

## 2023-04-22 MED ORDER — SODIUM CHLORIDE 0.9 % IV SOLN: Freq: Once | INTRAVENOUS | Status: AC

## 2023-04-22 NOTE — Patient Instructions (Signed)
[facility]  Discharge Instructions: Thank you for choosing [facility] to provide your oncology and hematology care.   If you have a lab appointment with the Cancer Center, please go directly to the Cancer Center and check in at the registration area.   Wear comfortable clothing and clothing appropriate for easy access to any Portacath or PICC line.   We strive to give you quality time with your provider. You may need to reschedule your appointment if you arrive late (15 or more minutes).  Arriving late affects you and other patients whose appointments are after yours.  Also, if you miss three or more appointments without notifying the office, you may be dismissed from the clinic at the provider's discretion.      For prescription refill requests, have your pharmacy contact our office and allow 72 hours for refills to be completed.    Today you received the following chemotherapy and/or immunotherapy agent: Pembrolizumab (Keytruda)   To help prevent nausea and vomiting after your treatment, we encourage you to take your nausea medication as directed.  BELOW ARE SYMPTOMS THAT SHOULD BE REPORTED IMMEDIATELY: *FEVER GREATER THAN 100.4 F (38 C) OR HIGHER *CHILLS OR SWEATING *NAUSEA AND VOMITING THAT IS NOT CONTROLLED WITH YOUR NAUSEA MEDICATION *UNUSUAL SHORTNESS OF BREATH *UNUSUAL BRUISING OR BLEEDING *URINARY PROBLEMS (pain or burning when urinating, or frequent urination) *BOWEL PROBLEMS (unusual diarrhea, constipation, pain near the anus) TENDERNESS IN MOUTH AND THROAT WITH OR WITHOUT PRESENCE OF ULCERS (sore throat, sores in mouth, or a toothache) UNUSUAL RASH, SWELLING OR PAIN  UNUSUAL VAGINAL DISCHARGE OR ITCHING   Items with * indicate a potential emergency and should be followed up as soon as possible or go to the Emergency Department if any problems should occur.  Please show the CHEMOTHERAPY ALERT CARD or IMMUNOTHERAPY ALERT  CARD at check-in to the Emergency Department and triage nurse.  Should you have questions after your visit or need to cancel or reschedule your appointment, please contact [facility]  Dept: [phone]  and follow the prompts.  Office hours are 8:00 a.m. to 4:30 p.m. Monday - Friday. Please note that voicemails left after 4:00 p.m. may not be returned until the following business day.  We are closed weekends and major holidays. You have access to a nurse at all times for urgent questions. Please call the main number to the clinic Dept: [phone] and follow the prompts.   For any non-urgent questions, you may also contact your provider using MyChart. We now offer e-Visits for anyone 18 and older to request care online for non-urgent symptoms. For details visit [email or web address].   Also download the MyChart app! Go to the app store, search "MyChart", open the app, select , and log in with your MyChart username and password.  Pembrolizumab Injection What is this medication? PEMBROLIZUMAB (PEM broe LIZ ue mab) treats some types of cancer. It works by helping your immune system slow or stop the spread of cancer cells. It is a monoclonal antibody. This medicine may be used for other purposes; ask your health care provider or pharmacist if you have questions. COMMON BRAND NAME(S): Keytruda What should I tell my care team before I take this medication? They need to know if you have any of these conditions: Allogeneic stem cell transplant (uses someone else's stem cells) Autoimmune diseases, such as Crohn disease, ulcerative colitis, lupus History of chest radiation Nervous system problems,   such as Guillain-Barre syndrome, myasthenia gravis Organ transplant An unusual or allergic reaction to pembrolizumab, other medications, foods, dyes, or preservatives Pregnant or trying to get pregnant Breast-feeding How should I use this medication? This  medication is injected into a vein. It is given by your care team in a hospital or clinic setting. A special MedGuide will be given to you before each treatment. Be sure to read this information carefully each time. Talk to your care team about the use of this medication in children. While it may be prescribed for children as young as 6 months for selected conditions, precautions do apply. Overdosage: If you think you have taken too much of this medicine contact a poison control center or emergency room at once. NOTE: This medicine is only for you. Do not share this medicine with others. What if I miss a dose? Keep appointments for follow-up doses. It is important not to miss your dose. Call your care team if you are unable to keep an appointment. What may interact with this medication? Interactions have not been studied. This list may not describe all possible interactions. Give your health care provider a list of all the medicines, herbs, non-prescription drugs, or dietary supplements you use. Also tell them if you smoke, drink alcohol, or use illegal drugs. Some items may interact with your medicine. What should I watch for while using this medication? Your condition will be monitored carefully while you are receiving this medication. You may need blood work while taking this medication. This medication may cause serious skin reactions. They can happen weeks to months after starting the medication. Contact your care team right away if you notice fevers or flu-like symptoms with a rash. The rash may be red or purple and then turn into blisters or peeling of the skin. You may also notice a red rash with swelling of the face, lips, or lymph nodes in your neck or under your arms. Tell your care team right away if you have any change in your eyesight. Talk to your care team if you may be pregnant. Serious birth defects can occur if you take this medication during pregnancy and for 4 months after the last  dose. You will need a negative pregnancy test before starting this medication. Contraception is recommended while taking this medication and for 4 months after the last dose. Your care team can help you find the option that works for you. Do not breastfeed while taking this medication and for 4 months after the last dose. What side effects may I notice from receiving this medication? Side effects that you should report to your care team as soon as possible: Allergic reactions--skin rash, itching, hives, swelling of the face, lips, tongue, or throat Dry cough, shortness of breath or trouble breathing Eye pain, redness, irritation, or discharge with blurry or decreased vision Heart muscle inflammation--unusual weakness or fatigue, shortness of breath, chest pain, fast or irregular heartbeat, dizziness, swelling of the ankles, feet, or hands Hormone gland problems--headache, sensitivity to light, unusual weakness or fatigue, dizziness, fast or irregular heartbeat, increased sensitivity to cold or heat, excessive sweating, constipation, hair loss, increased thirst or amount of urine, tremors or shaking, irritability Infusion reactions--chest pain, shortness of breath or trouble breathing, feeling faint or lightheaded Kidney injury (glomerulonephritis)--decrease in the amount of urine, red or dark brown urine, foamy or bubbly urine, swelling of the ankles, hands, or feet Liver injury--right upper belly pain, loss of appetite, nausea, light-colored stool, dark yellow or brown urine,   yellowing skin or eyes, unusual weakness or fatigue Pain, tingling, or numbness in the hands or feet, muscle weakness, change in vision, confusion or trouble speaking, loss of balance or coordination, trouble walking, seizures Rash, fever, and swollen lymph nodes Redness, blistering, peeling, or loosening of the skin, including inside the mouth Sudden or severe stomach pain, bloody diarrhea, fever, nausea, vomiting Side effects  that usually do not require medical attention (report to your care team if they continue or are bothersome): Bone, joint, or muscle pain Diarrhea Fatigue Loss of appetite Nausea Skin rash This list may not describe all possible side effects. Call your doctor for medical advice about side effects. You may report side effects to FDA at 1-800-FDA-1088. Where should I keep my medication? This medication is given in a hospital or clinic. It will not be stored at home. NOTE: This sheet is a summary. It may not cover all possible information. If you have questions about this medicine, talk to your doctor, pharmacist, or health care provider.  2023 Elsevier/Gold Standard (2022-04-09 00:00:00)   

## 2023-04-27 ENCOUNTER — Other Ambulatory Visit: Payer: Self-pay | Admitting: Nurse Practitioner

## 2023-04-27 ENCOUNTER — Other Ambulatory Visit: Payer: Self-pay | Admitting: Physician Assistant

## 2023-04-27 DIAGNOSIS — C3492 Malignant neoplasm of unspecified part of left bronchus or lung: Secondary | ICD-10-CM

## 2023-05-08 ENCOUNTER — Telehealth: Payer: Self-pay | Admitting: Internal Medicine

## 2023-05-08 NOTE — Telephone Encounter (Signed)
Rescheduled 06/04 to 06/03, called and left a voicemail.

## 2023-05-08 NOTE — Progress Notes (Deleted)
[facility] OFFICE PROGRESS NOTE  [name], MD [address] [place] [address]  DIAGNOSIS: Stage IV (T1c, N3, M1 C) non-small cell lung cancer, adenocarcinoma presented with left upper lobe metastatic neoplasm presented with left lower lobe lung nodule in addition to extensive lymphadenopathy involving the neck, left hilar and mediastinal lymphadenopathy as well as upper abdomen lymph nodes and multiple subcutaneous/muscular nodules diagnosed in January 2024.   Molecular studies: [id number]   PD-L1 expression: 99%  PRIOR THERAPY: None  CURRENT THERAPY: Systemic chemotherapy with carboplatin for AUC of 5, Alimta 500 Mg/M2 and Keytruda 200 Mg IV every 3 weeks.  First dose is planned for December 30, 2022. Starting from cycle #2 on 02/18/23 she will be on single agent Keytruda 200 mg IV every 3 weeks due to her recent stroke and physical debility. She is status post 5 cycles of treatment.   INTERVAL HISTORY: Patricia [name] 77 y.o. female returns to the clinic today for a follow-up visit accompanied by ***. The patient is currently being treated with single agent immunotherapy with [facility] for her lung cancer. Today, she denies any fever, chills, or night sweats. Her weight is stable. Her appetite is improving. She previously saw a member of the nutritionist team. She has a prescription for tessalon for cough if needed but overall she denies significant cough. She denies any dyspnea, chest pain, or hemoptysis. She denies nausea/vomiting. Denies any diarrhea or constipation. She has mild dry skin but denies rash.She follows with neurology for her history of stroke. She completed PT and OT for the stroke. She has some visual disturbances and saw a ophthalmologist who let her know her vision should improve in time. She is here today for evaluation and repeat blood work before undergoing cycle #6.   MEDICAL HISTORY: Past Medical History:  Diagnosis Date   Depression     GERD (gastroesophageal reflux disease)    Hypercholesterolemia    IFG (impaired fasting glucose)    Nonintractable episodic headache 01/18/2023   Osteopenia    PONV (postoperative nausea and vomiting)     ALLERGIES:  is allergic to codeine.  MEDICATIONS:  Current Outpatient Medications  Medication Sig Dispense Refill   ALPRAZolam (XANAX) 0.5 MG tablet Take 1 tablet (0.5 mg total) by mouth at bedtime as needed for anxiety or sleep. TAKE (1) TABLET BY MOUTH AT BEDTIME AS NEEDED FOR SLEEP. 30 tablet 5   apixaban (ELIQUIS) 5 MG TABS tablet Take 1 tablet (5 mg total) by mouth 2 (two) times daily. 60 tablet 6   benzonatate (TESSALON) 100 MG capsule TAKE 1 CAPSULE (100 MG TOTAL) BY MOUTH THREE TIMES DAILY. 90 capsule 2   butalbital-acetaminophen-caffeine (FIORICET) 50-325-40 MG tablet TAKE 1/2 TABLET BY MOUTH EVERY 6 (SIX) HOURS AS NEEDED FOR HEADACHE. 30 tablet 2   cyclobenzaprine (FLEXERIL) 5 MG tablet Take 1 tablet (5 mg total) by mouth 3 (three) times daily as needed for muscle spasms (neck/shoulder pain). (Patient not taking: Reported on 03/12/2023) 30 tablet 0   docusate sodium (COLACE) 100 MG capsule Take 1 capsule (100 mg total) by mouth 2 (two) times daily. 60 capsule 0   famotidine (PEPCID) 20 MG tablet Take 1 tablet (20 mg total) by mouth 2 (two) times daily. 60 tablet 6   hydrochlorothiazide (HYDRODIURIL) 25 MG tablet Take 1 tablet (25 mg total) by mouth daily. 30 tablet 6   KLOR-CON M20 20 MEQ tablet TAKE 1 TABLET BY MOUTH 3 TIMES DAILY. 270 tablet 1  lansoprazole (PREVACID) 30 MG capsule Take one po qd for acid reflux 90 capsule 1   Menthol-Methyl Salicylate (MUSCLE RUB) 10-15 % CREA Apply 1 Application topically 3 (three) times daily to bilateral calves 85 g 0   Multiple Vitamin (MULTIVITAMIN WITH MINERALS) TABS tablet Take 1 tablet by mouth daily. (Patient not taking: Reported on 03/12/2023)     ondansetron (ZOFRAN) 8 MG tablet Take 1 tablet (8 mg total) by mouth every 8 (eight)  hours as needed for nausea or vomiting. (Patient not taking: Reported on 03/03/2023) 30 tablet 2   PARoxetine (PAXIL) 40 MG tablet TAKE 1 TABLET BY MOUTH EVERY DAY IN THE MORNING 90 tablet 2   prochlorperazine (COMPAZINE) 10 MG tablet Take 1 tablet (10 mg total) by mouth every 6 (six) hours as needed for nausea or vomiting. 30 tablet 0   rosuvastatin (CRESTOR) 20 MG tablet Take 1 tablet (20 mg total) by mouth daily. 30 tablet 6   senna-docusate (SENOKOT-S) 8.6-50 MG tablet Take 3 tablets by mouth 2 (two) times daily. (Patient not taking: Reported on 03/12/2023) 180 tablet 0   topiramate (TOPAMAX) 100 MG tablet Take 1 tablet (100 mg total) by mouth 2 (two) times daily. 60 tablet 6   No current facility-administered medications for this visit.    SURGICAL HISTORY:  Past Surgical History:  Procedure Laterality Date   ABDOMINAL HYSTERECTOMY     CATARACT EXTRACTION W/PHACO  09/12/2011   Procedure: CATARACT EXTRACTION PHACO AND INTRAOCULAR LENS PLACEMENT (IOC);  Surgeon: Gemma Payor;  Location: [facility];  Service: Ophthalmology;  Laterality: Left;  CDE: 8.91   CATARACT EXTRACTION W/PHACO  11/18/2011   Procedure: CATARACT EXTRACTION PHACO AND INTRAOCULAR LENS PLACEMENT (IOC);  Surgeon: Gemma Payor;  Location: [facility];  Service: Ophthalmology;  Laterality: Right;  CDE:10.26   COLONOSCOPY  2007   Dr. Jena Gauss: internal hemorrhoids, single anal papilla poor prep.    COLONOSCOPY N/A 03/27/2017   Rourk: Normal exam.   ECTOPIC PREGNANCY SURGERY     removal of right tube   ESOPHAGOGASTRODUODENOSCOPY (EGD) WITH PROPOFOL N/A 09/10/2021   Procedure: ESOPHAGOGASTRODUODENOSCOPY (EGD) WITH PROPOFOL;  Surgeon: Corbin Ade, MD;  Location: [facility];  Service: Endoscopy;  Laterality: N/A;  8:15AM   MALONEY DILATION N/A 09/10/2021   Procedure: Elease Hashimoto DILATION;  Surgeon: Corbin Ade, MD;  Location: [facility];  Service: Endoscopy;  Laterality: N/A;   OVARIAN CYST REMOVAL     right   RECTOCELE REPAIR N/A  01/18/2014   Procedure: POSTERIOR REPAIR (RECTOCELE);  Surgeon: Tilda Burrow, MD;  Location: [facility];  Service: Gynecology;  Laterality: N/A;   VAGINAL HYSTERECTOMY N/A 01/18/2014   Procedure: HYSTERECTOMY VAGINAL;  Surgeon: Tilda Burrow, MD;  Location: [facility];  Service: Gynecology;  Laterality: N/A;    REVIEW OF SYSTEMS:   Review of Systems  Constitutional: Negative for appetite change, chills, fatigue, fever and unexpected weight change.  HENT:   Negative for mouth sores, nosebleeds, sore throat and trouble swallowing.   Eyes: Negative for eye problems and icterus.  Respiratory: Negative for cough, hemoptysis, shortness of breath and wheezing.   Cardiovascular: Negative for chest pain and leg swelling.  Gastrointestinal: Negative for abdominal pain, constipation, diarrhea, nausea and vomiting.  Genitourinary: Negative for bladder incontinence, difficulty urinating, dysuria, frequency and hematuria.   Musculoskeletal: Negative for back pain, gait problem, neck pain and neck stiffness.  Skin: Negative for itching and rash.  Neurological: Negative for dizziness, extremity weakness, gait problem, headaches, light-headedness and seizures.  Hematological: Negative for adenopathy. Does not bruise/bleed easily.  Psychiatric/Behavioral: Negative for confusion, depression and sleep disturbance. The patient is not nervous/anxious.     PHYSICAL EXAMINATION:  There were no vitals taken for this visit.  ECOG PERFORMANCE STATUS: {CHL ONC ECOG [id number]  Physical Exam  Constitutional: Oriented to person, place, and time and well-developed, well-nourished, and in no distress. No distress.  HENT:  Head: Normocephalic and atraumatic.  Mouth/Throat: Oropharynx is clear and moist. No oropharyngeal exudate.  Eyes: Conjunctivae are normal. Right eye exhibits no discharge. Left eye exhibits no discharge. No scleral icterus.  Neck: Normal range of motion. Neck supple.  Cardiovascular: Normal  rate, regular rhythm, normal heart sounds and intact distal pulses.   Pulmonary/Chest: Effort normal and breath sounds normal. No respiratory distress. No wheezes. No rales.  Abdominal: Soft. Bowel sounds are normal. Exhibits no distension and no mass. There is no tenderness.  Musculoskeletal: Normal range of motion. Exhibits no edema.  Lymphadenopathy:    No cervical adenopathy.  Neurological: Alert and oriented to person, place, and time. Exhibits normal muscle tone. Gait normal. Coordination normal.  Skin: Skin is warm and dry. No rash noted. Not diaphoretic. No erythema. No pallor.  Psychiatric: Mood, memory and judgment normal.  Vitals reviewed.  LABORATORY DATA: Lab Results  Component Value Date   WBC 6.5 04/22/2023   HGB 14.2 04/22/2023   HCT 43.8 04/22/2023   MCV 92.4 04/22/2023   PLT 200 04/22/2023      Chemistry      Component Value Date/Time   NA 141 04/22/2023 0851   NA 139 08/02/2022 1112   K 3.9 04/22/2023 0851   CL 109 04/22/2023 0851   CO2 24 04/22/2023 0851   BUN 18 04/22/2023 0851   BUN 16 08/02/2022 1112   CREATININE 0.97 04/22/2023 0851      Component Value Date/Time   CALCIUM 9.7 04/22/2023 0851   ALKPHOS 86 04/22/2023 0851   AST 28 04/22/2023 0851   ALT 34 04/22/2023 0851   BILITOT 0.3 04/22/2023 0851       RADIOGRAPHIC STUDIES:  No results found.   ASSESSMENT/PLAN:  This is a very pleasant 77 year old Caucasian female diagnosed with stage IV (T1c, N3, M1 C) non-small cell lung cancer, adenocarcinoma.  The patient presented with a left upper lobe metastatic neoplasm in addition to extensive lymphadenopathy involving the neck, left hilar, mediastinal, and upper abdominal lymph nodes with multiple subcutaneous/muscular nodules.  She was diagnosed in January 2024.  Her molecular studies show she is positive for K-ras G12 C which can be used in the second line setting.  Her PD-L1 expression is 99%    The patient is going to start  systemic/palliative chemotherapy and immunotherapy with carboplatin for AUC of 5, Alimta 500 mg/m, Keytruda 200 mg IV every 3 weeks.  Her first cycle of treatment on 12/30/22.  She started on treatment with single agent immunotherapy with Keytruda 200 mg IV starting from cycle #2 due to her recent stroke.    Labs were reviewed. Recommend that she proceed with cycle #5 today as scheduled.    She will be on Eliquis indefinitely due to her bilateral DVTs and strokes in the setting of malignancy.  I will arrange for a restaging CT scan of the CAP prior to her next appointment   She will continue to follow with neurology for her strokes. She will reach out to her PCP to extend her PT. She states she has all of her assistive devices she  needs.    We will see her back for a follow up visit in 3 weeks for evaluation and repeat blood work before starting cycle #6.    She will continue to use tessalon for cough. Overall, her cough is very mild and she does not cough often.  No orders of the defined types were placed in this encounter.    I spent {CHL ONC TIME VISIT - ZOXWR:[id number]} counseling the patient face to face. The total time spent in the appointment was {CHL ONC TIME VISIT - BJYNW:[id number]}.  Tiasia Weberg L Jessie Cowher, PA-C 05/08/23

## 2023-05-09 ENCOUNTER — Other Ambulatory Visit: Payer: Self-pay | Admitting: Family Medicine

## 2023-05-12 ENCOUNTER — Inpatient Hospital Stay: Payer: Medicare HMO

## 2023-05-12 ENCOUNTER — Inpatient Hospital Stay: Payer: Medicare HMO | Admitting: Physician Assistant

## 2023-05-12 ENCOUNTER — Telehealth: Payer: Self-pay | Admitting: Physician Assistant

## 2023-05-12 NOTE — Telephone Encounter (Signed)
I called the patient to confirm her appointments for tomorrow. She was not aware that her appointment time had changed. We rescheduled her appointment for tomorrow on 6/4 with the arrival time of 10AM. She is aware. Spoke to her earlier today and confirmed with her husband.

## 2023-05-13 ENCOUNTER — Encounter: Payer: Self-pay | Admitting: Internal Medicine

## 2023-05-13 ENCOUNTER — Ambulatory Visit: Payer: Medicare HMO

## 2023-05-13 ENCOUNTER — Other Ambulatory Visit: Payer: Medicare HMO

## 2023-05-13 ENCOUNTER — Other Ambulatory Visit: Payer: Self-pay | Admitting: Internal Medicine

## 2023-05-13 ENCOUNTER — Encounter: Payer: Self-pay | Admitting: Medical Oncology

## 2023-05-13 ENCOUNTER — Inpatient Hospital Stay: Payer: Medicare HMO | Attending: Internal Medicine | Admitting: Internal Medicine

## 2023-05-13 ENCOUNTER — Ambulatory Visit: Payer: Medicare HMO | Admitting: Internal Medicine

## 2023-05-13 ENCOUNTER — Telehealth: Payer: Self-pay | Admitting: Medical Oncology

## 2023-05-13 ENCOUNTER — Other Ambulatory Visit: Payer: Self-pay

## 2023-05-13 ENCOUNTER — Inpatient Hospital Stay: Payer: Medicare HMO

## 2023-05-13 VITALS — BP 115/85 | HR 74 | Temp 97.8°F | Resp 16 | Ht <= 58 in | Wt 131.1 lb

## 2023-05-13 VITALS — BP 116/75 | HR 61 | Resp 16

## 2023-05-13 DIAGNOSIS — Z5112 Encounter for antineoplastic immunotherapy: Secondary | ICD-10-CM | POA: Insufficient documentation

## 2023-05-13 DIAGNOSIS — Z79620 Long term (current) use of immunosuppressive biologic: Secondary | ICD-10-CM | POA: Diagnosis not present

## 2023-05-13 DIAGNOSIS — Z8673 Personal history of transient ischemic attack (TIA), and cerebral infarction without residual deficits: Secondary | ICD-10-CM | POA: Diagnosis not present

## 2023-05-13 DIAGNOSIS — C3492 Malignant neoplasm of unspecified part of left bronchus or lung: Secondary | ICD-10-CM | POA: Diagnosis not present

## 2023-05-13 DIAGNOSIS — C3490 Malignant neoplasm of unspecified part of unspecified bronchus or lung: Secondary | ICD-10-CM

## 2023-05-13 DIAGNOSIS — Z90710 Acquired absence of both cervix and uterus: Secondary | ICD-10-CM | POA: Insufficient documentation

## 2023-05-13 DIAGNOSIS — R748 Abnormal levels of other serum enzymes: Secondary | ICD-10-CM | POA: Insufficient documentation

## 2023-05-13 DIAGNOSIS — Z7952 Long term (current) use of systemic steroids: Secondary | ICD-10-CM | POA: Diagnosis not present

## 2023-05-13 DIAGNOSIS — C3412 Malignant neoplasm of upper lobe, left bronchus or lung: Secondary | ICD-10-CM | POA: Insufficient documentation

## 2023-05-13 LAB — CMP (CANCER CENTER ONLY)
ALT: 131 U/L — ABNORMAL HIGH (ref 0–44)
AST: 107 U/L — ABNORMAL HIGH (ref 15–41)
Albumin: 4.6 g/dL (ref 3.5–5.0)
Alkaline Phosphatase: 114 U/L (ref 38–126)
Anion gap: 8 (ref 5–15)
BUN: 25 mg/dL — ABNORMAL HIGH (ref 8–23)
CO2: 25 mmol/L (ref 22–32)
Calcium: 10.6 mg/dL — ABNORMAL HIGH (ref 8.9–10.3)
Chloride: 109 mmol/L (ref 98–111)
Creatinine: 1.08 mg/dL — ABNORMAL HIGH (ref 0.44–1.00)
GFR, Estimated: 54 mL/min — ABNORMAL LOW (ref >60)
Glucose, Bld: 108 mg/dL — ABNORMAL HIGH (ref 70–99)
Potassium: 3.5 mmol/L (ref 3.5–5.1)
Sodium: 142 mmol/L (ref 135–145)
Total Bilirubin: 0.4 mg/dL (ref 0.3–1.2)
Total Protein: 7.2 g/dL (ref 6.5–8.1)

## 2023-05-13 LAB — CBC WITH DIFFERENTIAL (CANCER CENTER ONLY)
Abs Immature Granulocytes: 0.01 10*3/uL (ref 0.00–0.07)
Basophils Absolute: 0.1 10*3/uL (ref 0.0–0.1)
Basophils Relative: 2 %
Eosinophils Absolute: 0.2 10*3/uL (ref 0.0–0.5)
Eosinophils Relative: 3 %
HCT: 44.7 % (ref 36.0–46.0)
Hemoglobin: 14.8 g/dL (ref 12.0–15.0)
Immature Granulocytes: 0 %
Lymphocytes Relative: 35 %
Lymphs Abs: 2.2 10*3/uL (ref 0.7–4.0)
MCH: 30.3 pg (ref 26.0–34.0)
MCHC: 33.1 g/dL (ref 30.0–36.0)
MCV: 91.6 fL (ref 80.0–100.0)
Monocytes Absolute: 0.4 10*3/uL (ref 0.1–1.0)
Monocytes Relative: 6 %
Neutro Abs: 3.6 10*3/uL (ref 1.7–7.7)
Neutrophils Relative %: 54 %
Platelet Count: 185 10*3/uL (ref 150–400)
RBC: 4.88 MIL/uL (ref 3.87–5.11)
RDW: 13.7 % (ref 11.5–15.5)
WBC Count: 6.5 10*3/uL (ref 4.0–10.5)
nRBC: 0 % (ref 0.0–0.2)

## 2023-05-13 LAB — TSH: TSH: 72.815 u[IU]/mL — ABNORMAL HIGH (ref 0.350–4.500)

## 2023-05-13 MED ORDER — SODIUM CHLORIDE 0.9 % IV SOLN: Freq: Once | INTRAVENOUS | Status: AC

## 2023-05-13 MED ORDER — SODIUM CHLORIDE 0.9 % IV SOLN
200.0000 mg | Freq: Once | INTRAVENOUS | Status: AC
  Administered 2023-05-13: 200 mg via INTRAVENOUS
  Filled 2023-05-13: qty 200

## 2023-05-13 MED ORDER — LEVOTHYROXINE SODIUM 50 MCG PO TABS: 50.0000 ug | ORAL_TABLET | Freq: Every day | ORAL | 1 refills | Status: DC

## 2023-05-13 NOTE — Progress Notes (Signed)
Patient seen by Dr. Gypsy Balsam are within treatment parameters.  Labs reviewed: and are not all within treatment parameters. AST= 107. ALT=131  Per physician team, patient is ready for treatment and there are NO modifications to the treatment plan.  Per Dr Arbutus Ped , it is ok to treat pt today with Keytruda and ALT -131 and AST=107.

## 2023-05-13 NOTE — Telephone Encounter (Signed)
-----   Message from Si Gaul, MD sent at 05/13/2023  4:27 PM EDT ----- Please let her know that I will start her on treatment with levothyroxine for her hypothyroidism.  Thank you ----- Message ----- From: Leory Plowman, Lab In [place] Sent: 05/13/2023  10:19 AM EDT To: Si Gaul, MD

## 2023-05-13 NOTE — Patient Instructions (Signed)
[facility]  Discharge Instructions: Thank you for choosing [facility] to provide your oncology and hematology care.   If you have a lab appointment with the Cancer Center, please go directly to the Cancer Center and check in at the registration area.   Wear comfortable clothing and clothing appropriate for easy access to any Portacath or PICC line.   We strive to give you quality time with your provider. You may need to reschedule your appointment if you arrive late (15 or more minutes).  Arriving late affects you and other patients whose appointments are after yours.  Also, if you miss three or more appointments without notifying the office, you may be dismissed from the clinic at the provider's discretion.      For prescription refill requests, have your pharmacy contact our office and allow 72 hours for refills to be completed.    Today you received the following chemotherapy and/or immunotherapy agent: Pembrolizumab (Keytruda)   To help prevent nausea and vomiting after your treatment, we encourage you to take your nausea medication as directed.  BELOW ARE SYMPTOMS THAT SHOULD BE REPORTED IMMEDIATELY: *FEVER GREATER THAN 100.4 F (38 C) OR HIGHER *CHILLS OR SWEATING *NAUSEA AND VOMITING THAT IS NOT CONTROLLED WITH YOUR NAUSEA MEDICATION *UNUSUAL SHORTNESS OF BREATH *UNUSUAL BRUISING OR BLEEDING *URINARY PROBLEMS (pain or burning when urinating, or frequent urination) *BOWEL PROBLEMS (unusual diarrhea, constipation, pain near the anus) TENDERNESS IN MOUTH AND THROAT WITH OR WITHOUT PRESENCE OF ULCERS (sore throat, sores in mouth, or a toothache) UNUSUAL RASH, SWELLING OR PAIN  UNUSUAL VAGINAL DISCHARGE OR ITCHING   Items with * indicate a potential emergency and should be followed up as soon as possible or go to the Emergency Department if any problems should occur.  Please show the CHEMOTHERAPY ALERT CARD or IMMUNOTHERAPY ALERT  CARD at check-in to the Emergency Department and triage nurse.  Should you have questions after your visit or need to cancel or reschedule your appointment, please contact [facility]  Dept: [phone]  and follow the prompts.  Office hours are 8:00 a.m. to 4:30 p.m. Monday - Friday. Please note that voicemails left after 4:00 p.m. may not be returned until the following business day.  We are closed weekends and major holidays. You have access to a nurse at all times for urgent questions. Please call the main number to the clinic Dept: [phone] and follow the prompts.   For any non-urgent questions, you may also contact your provider using MyChart. We now offer e-Visits for anyone 18 and older to request care online for non-urgent symptoms. For details visit [email or web address].   Also download the MyChart app! Go to the app store, search "MyChart", open the app, select [facility], and log in with your MyChart username and password.  Pembrolizumab Injection What is this medication? PEMBROLIZUMAB (PEM broe LIZ ue mab) treats some types of cancer. It works by helping your immune system slow or stop the spread of cancer cells. It is a monoclonal antibody. This medicine may be used for other purposes; ask your health care provider or pharmacist if you have questions. COMMON BRAND NAME(S): Keytruda What should I tell my care team before I take this medication? They need to know if you have any of these conditions: Allogeneic stem cell transplant (uses someone else's stem cells) Autoimmune diseases, such as Crohn disease, ulcerative colitis, lupus History of chest radiation Nervous system problems,   such as Guillain-Barre syndrome, myasthenia gravis Organ transplant An unusual or allergic reaction to pembrolizumab, other medications, foods, dyes, or preservatives Pregnant or trying to get pregnant Breast-feeding How should I use this medication? This  medication is injected into a vein. It is given by your care team in a hospital or clinic setting. A special MedGuide will be given to you before each treatment. Be sure to read this information carefully each time. Talk to your care team about the use of this medication in children. While it may be prescribed for children as young as 6 months for selected conditions, precautions do apply. Overdosage: If you think you have taken too much of this medicine contact a poison control center or emergency room at once. NOTE: This medicine is only for you. Do not share this medicine with others. What if I miss a dose? Keep appointments for follow-up doses. It is important not to miss your dose. Call your care team if you are unable to keep an appointment. What may interact with this medication? Interactions have not been studied. This list may not describe all possible interactions. Give your health care provider a list of all the medicines, herbs, non-prescription drugs, or dietary supplements you use. Also tell them if you smoke, drink alcohol, or use illegal drugs. Some items may interact with your medicine. What should I watch for while using this medication? Your condition will be monitored carefully while you are receiving this medication. You may need blood work while taking this medication. This medication may cause serious skin reactions. They can happen weeks to months after starting the medication. Contact your care team right away if you notice fevers or flu-like symptoms with a rash. The rash may be red or purple and then turn into blisters or peeling of the skin. You may also notice a red rash with swelling of the face, lips, or lymph nodes in your neck or under your arms. Tell your care team right away if you have any change in your eyesight. Talk to your care team if you may be pregnant. Serious birth defects can occur if you take this medication during pregnancy and for 4 months after the last  dose. You will need a negative pregnancy test before starting this medication. Contraception is recommended while taking this medication and for 4 months after the last dose. Your care team can help you find the option that works for you. Do not breastfeed while taking this medication and for 4 months after the last dose. What side effects may I notice from receiving this medication? Side effects that you should report to your care team as soon as possible: Allergic reactions--skin rash, itching, hives, swelling of the face, lips, tongue, or throat Dry cough, shortness of breath or trouble breathing Eye pain, redness, irritation, or discharge with blurry or decreased vision Heart muscle inflammation--unusual weakness or fatigue, shortness of breath, chest pain, fast or irregular heartbeat, dizziness, swelling of the ankles, feet, or hands Hormone gland problems--headache, sensitivity to light, unusual weakness or fatigue, dizziness, fast or irregular heartbeat, increased sensitivity to cold or heat, excessive sweating, constipation, hair loss, increased thirst or amount of urine, tremors or shaking, irritability Infusion reactions--chest pain, shortness of breath or trouble breathing, feeling faint or lightheaded Kidney injury (glomerulonephritis)--decrease in the amount of urine, red or dark brown urine, foamy or bubbly urine, swelling of the ankles, hands, or feet Liver injury--right upper belly pain, loss of appetite, nausea, light-colored stool, dark yellow or brown urine,   yellowing skin or eyes, unusual weakness or fatigue Pain, tingling, or numbness in the hands or feet, muscle weakness, change in vision, confusion or trouble speaking, loss of balance or coordination, trouble walking, seizures Rash, fever, and swollen lymph nodes Redness, blistering, peeling, or loosening of the skin, including inside the mouth Sudden or severe stomach pain, bloody diarrhea, fever, nausea, vomiting Side effects  that usually do not require medical attention (report to your care team if they continue or are bothersome): Bone, joint, or muscle pain Diarrhea Fatigue Loss of appetite Nausea Skin rash This list may not describe all possible side effects. Call your doctor for medical advice about side effects. You may report side effects to FDA at 1-800-FDA-1088. Where should I keep my medication? This medication is given in a hospital or clinic. It will not be stored at home. NOTE: This sheet is a summary. It may not cover all possible information. If you have questions about this medicine, talk to your doctor, pharmacist, or health care provider.  2023 Elsevier/Gold Standard (2022-04-09 00:00:00)   

## 2023-05-13 NOTE — Telephone Encounter (Signed)
Husband notified that pt needs to start Levothyroxine,

## 2023-05-13 NOTE — Progress Notes (Signed)
[facility] [phone]   Fax:([phone]  OFFICE PROGRESS NOTE  Patricia Sciara, MD [address] [employer] [place] [address]  DIAGNOSIS: Stage IV (T1c, N3, M1 C) non-small cell lung cancer, adenocarcinoma presented with left upper lobe metastatic neoplasm presented with left lower lobe lung nodule in addition to extensive lymphadenopathy involving the neck, left hilar and mediastinal lymphadenopathy as well as upper abdomen lymph nodes and multiple subcutaneous/muscular nodules diagnosed in January 2024.  Molecular studies by foundation 1 showed positive KRAS G12C mutation and PD-L1 expression 99%  PRIOR THERAPY: None   CURRENT THERAPY: Systemic chemotherapy with carboplatin for AUC of 5, Alimta 500 Mg/M2 and Keytruda 200 Mg IV every 3 weeks.  First dose is planned for December 30, 2022. Starting from cycle #2 on 02/18/23 she will be on single agent Keytruda 200 mg IV every 3 weeks due to her recent stroke and physical debility.  Status post 5 cycles.  INTERVAL HISTORY: Patricia [name] 77 y.o. female returns to the clinic today for follow-up visit accompanied by her husband.  The patient is feeling fine today with no concerning complaints except for intermittent headache.  She denied having any current chest pain, shortness of breath, cough or hemoptysis.  She has occasional nausea with no vomiting, diarrhea or constipation.  She has no headache or visual changes.  She has no fever or chills.  She has been tolerating her treatment with Keytruda fairly well.  She is here today for evaluation before starting cycle #6 of her treatment.  MEDICAL HISTORY: Past Medical History:  Diagnosis Date   Depression    GERD (gastroesophageal reflux disease)    Hypercholesterolemia    IFG (impaired fasting glucose)    Nonintractable episodic headache 01/18/2023   Osteopenia    PONV (postoperative nausea and vomiting)     ALLERGIES:  is allergic to  codeine.  MEDICATIONS:  Current Outpatient Medications  Medication Sig Dispense Refill   ALPRAZolam (XANAX) 0.5 MG tablet Take 1 tablet (0.5 mg total) by mouth at bedtime as needed for anxiety or sleep. TAKE (1) TABLET BY MOUTH AT BEDTIME AS NEEDED FOR SLEEP. 30 tablet 5   apixaban (ELIQUIS) 5 MG TABS tablet Take 1 tablet (5 mg total) by mouth 2 (two) times daily. 60 tablet 6   benzonatate (TESSALON) 100 MG capsule TAKE 1 CAPSULE (100 MG TOTAL) BY MOUTH THREE TIMES DAILY. 90 capsule 2   butalbital-acetaminophen-caffeine (FIORICET) 50-325-40 MG tablet TAKE 1/2 TABLET BY MOUTH EVERY 6 (SIX) HOURS AS NEEDED FOR HEADACHE. 30 tablet 2   cyclobenzaprine (FLEXERIL) 5 MG tablet Take 1 tablet (5 mg total) by mouth 3 (three) times daily as needed for muscle spasms (neck/shoulder pain). (Patient not taking: Reported on 03/12/2023) 30 tablet 0   docusate sodium (COLACE) 100 MG capsule Take 1 capsule (100 mg total) by mouth 2 (two) times daily. 60 capsule 0   famotidine (PEPCID) 20 MG tablet Take 1 tablet (20 mg total) by mouth 2 (two) times daily. 60 tablet 6   hydrochlorothiazide (HYDRODIURIL) 25 MG tablet Take 1 tablet (25 mg total) by mouth daily. 30 tablet 6   KLOR-CON M20 20 MEQ tablet TAKE 1 TABLET BY MOUTH 3 TIMES DAILY. 270 tablet 1   lansoprazole (PREVACID) 30 MG capsule Take one po qd for acid reflux 90 capsule 1   Menthol-Methyl Salicylate (MUSCLE RUB) 10-15 % CREA Apply 1 Application topically 3 (three) times daily to bilateral calves 85 g  0   Multiple Vitamin (MULTIVITAMIN WITH MINERALS) TABS tablet Take 1 tablet by mouth daily. (Patient not taking: Reported on 03/12/2023)     ondansetron (ZOFRAN) 8 MG tablet Take 1 tablet (8 mg total) by mouth every 8 (eight) hours as needed for nausea or vomiting. (Patient not taking: Reported on 03/03/2023) 30 tablet 2   PARoxetine (PAXIL) 40 MG tablet TAKE 1 TABLET BY MOUTH EVERY DAY IN THE MORNING 90 tablet 2   prochlorperazine (COMPAZINE) 10 MG tablet Take 1  tablet (10 mg total) by mouth every 6 (six) hours as needed for nausea or vomiting. 30 tablet 0   rosuvastatin (CRESTOR) 20 MG tablet Take 1 tablet (20 mg total) by mouth daily. 30 tablet 6   senna-docusate (SENOKOT-S) 8.6-50 MG tablet Take 3 tablets by mouth 2 (two) times daily. (Patient not taking: Reported on 03/12/2023) 180 tablet 0   topiramate (TOPAMAX) 100 MG tablet Take 1 tablet (100 mg total) by mouth 2 (two) times daily. 60 tablet 6   No current facility-administered medications for this visit.    SURGICAL HISTORY:  Past Surgical History:  Procedure Laterality Date   ABDOMINAL HYSTERECTOMY     CATARACT EXTRACTION W/PHACO  09/12/2011   Procedure: CATARACT EXTRACTION PHACO AND INTRAOCULAR LENS PLACEMENT (IOC);  Surgeon: Gemma Payor;  Location: [facility];  Service: Ophthalmology;  Laterality: Left;  CDE: 8.91   CATARACT EXTRACTION W/PHACO  11/18/2011   Procedure: CATARACT EXTRACTION PHACO AND INTRAOCULAR LENS PLACEMENT (IOC);  Surgeon: Gemma Payor;  Location: [facility];  Service: Ophthalmology;  Laterality: Right;  CDE:10.26   COLONOSCOPY  2007   Dr. Jena Gauss: internal hemorrhoids, single anal papilla poor prep.    COLONOSCOPY N/A 03/27/2017   Rourk: Normal exam.   ECTOPIC PREGNANCY SURGERY     removal of right tube   ESOPHAGOGASTRODUODENOSCOPY (EGD) WITH PROPOFOL N/A 09/10/2021   Procedure: ESOPHAGOGASTRODUODENOSCOPY (EGD) WITH PROPOFOL;  Surgeon: Corbin Ade, MD;  Location: [facility];  Service: Endoscopy;  Laterality: N/A;  8:15AM   MALONEY DILATION N/A 09/10/2021   Procedure: Elease Hashimoto DILATION;  Surgeon: Corbin Ade, MD;  Location: [facility];  Service: Endoscopy;  Laterality: N/A;   OVARIAN CYST REMOVAL     right   RECTOCELE REPAIR N/A 01/18/2014   Procedure: POSTERIOR REPAIR (RECTOCELE);  Surgeon: Tilda Burrow, MD;  Location: [facility];  Service: Gynecology;  Laterality: N/A;   VAGINAL HYSTERECTOMY N/A 01/18/2014   Procedure: HYSTERECTOMY VAGINAL;  Surgeon: Tilda Burrow,  MD;  Location: [facility];  Service: Gynecology;  Laterality: N/A;    REVIEW OF SYSTEMS:  A comprehensive review of systems was negative except for: Constitutional: positive for fatigue Gastrointestinal: positive for nausea Neurological: positive for headaches   PHYSICAL EXAMINATION: General appearance: alert, cooperative, fatigued, and no distress Head: Normocephalic, without obvious abnormality, atraumatic Neck: no adenopathy, no JVD, supple, symmetrical, trachea midline, and thyroid not enlarged, symmetric, no tenderness/mass/nodules Lymph nodes: Cervical, supraclavicular, and axillary nodes normal. Resp: clear to auscultation bilaterally Back: symmetric, no curvature. ROM normal. No CVA tenderness. Cardio: regular rate and rhythm, S1, S2 normal, no murmur, click, rub or gallop GI: soft, non-tender; bowel sounds normal; no masses,  no organomegaly Extremities: extremities normal, atraumatic, no cyanosis or edema  ECOG PERFORMANCE STATUS: 1 - Symptomatic but completely ambulatory  Blood pressure 115/85, pulse 74, temperature 97.8 F (36.6 C), temperature source Oral, resp. rate 16, height 4\' 10"  (1.473 m), weight 131 lb 1.6 oz (59.5 kg), SpO2 96 %.  LABORATORY DATA:  Lab Results  Component Value Date   WBC 6.5 05/13/2023   HGB 14.8 05/13/2023   HCT 44.7 05/13/2023   MCV 91.6 05/13/2023   PLT 185 05/13/2023      Chemistry      Component Value Date/Time   NA 142 05/13/2023 0948   NA 139 08/02/2022 1112   K 3.5 05/13/2023 0948   CL 109 05/13/2023 0948   CO2 25 05/13/2023 0948   BUN 25 (H) 05/13/2023 0948   BUN 16 08/02/2022 1112   CREATININE 1.08 (H) 05/13/2023 0948      Component Value Date/Time   CALCIUM 10.6 (H) 05/13/2023 0948   ALKPHOS 114 05/13/2023 0948   AST 107 (H) 05/13/2023 0948   ALT 131 (H) 05/13/2023 0948   BILITOT 0.4 05/13/2023 0948       RADIOGRAPHIC STUDIES: No results found.  ASSESSMENT AND PLAN: This is a very pleasant 77 years old white female  with Stage IV (T1c, N3, M1 C) non-small cell lung cancer, adenocarcinoma presented with left upper lobe metastatic neoplasm presented with left lower lobe lung nodule in addition to extensive lymphadenopathy involving the neck, left hilar and mediastinal lymphadenopathy as well as upper abdomen lymph nodes and multiple subcutaneous/muscular nodules diagnosed in January 2024. Molecular studies showed positive KRAS G12C mutation and PD-L1 expression was 99%. The patient started systemic chemotherapy initially with carboplatin for AUC of 5, Alimta 500 Mg/M2 and Keytruda 200 Mg IV every 3 weeks for 1 cycle but this chemotherapy was discontinued after the patient developed stroke.  She started the second cycle of her treatment with single agent Keytruda every 3 weeks. She is status post 5 cycles and has been tolerating her treatment with single agent Keytruda fairly well. I recommended for the patient to proceed with cycle #6 today as planned. I will see her back for follow-up visit in 3 weeks for evaluation with repeat CT scan of the chest, abdomen and pelvis for restaging of her disease. For the elevated liver enzymes, will continue to monitor her closely and consider The patient for treatment with high-dose prednisone if she has worsening of her liver dysfunction. The patient was advised to call immediately if she has any other concerning symptoms in the interval. The patient voices understanding of current disease status and treatment options and is in agreement with the current care plan.  All questions were answered. The patient knows to call the clinic with any problems, questions or concerns. We can certainly see the patient much sooner if necessary.  The total time spent in the appointment was 20 minutes.  Disclaimer: This note was dictated with voice recognition software. Similar sounding words can inadvertently be transcribed and may not be corrected upon review.

## 2023-05-15 LAB — T4: T4, Total: 1.5 ug/dL — ABNORMAL LOW (ref 4.5–12.0)

## 2023-05-21 DIAGNOSIS — H532 Diplopia: Secondary | ICD-10-CM | POA: Diagnosis not present

## 2023-05-25 ENCOUNTER — Other Ambulatory Visit: Payer: Self-pay | Admitting: Family Medicine

## 2023-05-30 ENCOUNTER — Ambulatory Visit (HOSPITAL_COMMUNITY)
Admission: RE | Admit: 2023-05-30 | Discharge: 2023-05-30 | Disposition: A | Discharge status: Home / Self Care | Payer: Medicare HMO | Source: Ambulatory Visit | Attending: Internal Medicine | Admitting: Internal Medicine

## 2023-05-30 DIAGNOSIS — C3490 Malignant neoplasm of unspecified part of unspecified bronchus or lung: Secondary | ICD-10-CM | POA: Insufficient documentation

## 2023-05-30 DIAGNOSIS — R918 Other nonspecific abnormal finding of lung field: Secondary | ICD-10-CM | POA: Diagnosis not present

## 2023-05-30 DIAGNOSIS — N2889 Other specified disorders of kidney and ureter: Secondary | ICD-10-CM | POA: Diagnosis not present

## 2023-05-30 MED ORDER — IOHEXOL 300 MG/ML  SOLN
100.0000 mL | Freq: Once | INTRAMUSCULAR | Status: AC | PRN
  Administered 2023-05-30: 69 mL via INTRAVENOUS

## 2023-05-30 MED ORDER — SODIUM CHLORIDE (PF) 0.9 % IJ SOLN
INTRAMUSCULAR | Status: AC
  Filled 2023-05-30: qty 50

## 2023-05-30 NOTE — Progress Notes (Unsigned)
[facility] OFFICE PROGRESS NOTE  [name], MD [address] [place] [address]  DIAGNOSIS: Stage IV (T1c, N3, M1 C) non-small cell lung cancer, adenocarcinoma presented with left upper lobe metastatic neoplasm presented with left lower lobe lung nodule in addition to extensive lymphadenopathy involving the neck, left hilar and mediastinal lymphadenopathy as well as upper abdomen lymph nodes and multiple subcutaneous/muscular nodules diagnosed in January 2024.   Molecular studies: [id number]   PD-L1 expression: 99%  PRIOR THERAPY: Status post 1 cycle of systemic chemotherapy with carboplatin for an AUC of 5 and Alimta 500 mg/m and Keytruda 200 mg IV every 3 weeks.  First dose 12/30/2022.   CURRENT THERAPY: Starting from cycle #2 on 02/18/23 she has been on single agent Keytruda 200 mg IV every 3 weeks due to her recent stroke and physical debility. She is status post 6 cycles of treatment.   INTERVAL HISTORY: [name] 77 y.o. female returns clinic today for follow-up visit accompanied by ***.  The patient is currently being treated with single agent immunotherapy with [facility] for her lung cancer.  She is tolerating single agent immunotherapy well.  At her last appointment, she had some slight elevations in her LFTs.  Dr. Arbutus Ped recommended monitoring for now but if she has increased LFTs he may consider her for high-dose prednisone taper.  He denies any nausea, vomiting, or abdominal pain.  Denies any jaundice or itching.  She denies any changes in her bowel habits.  She denies any alcohol or excessive Tylenol use.  She had a stroke in February 2024 and she is following with neurology.   Today, she denies any fever, chills, or night sweats. Her weight is stable. Her appetite is improving. She previously saw a member of the nutritionist team. She has a prescription for tessalon for cough if needed but overall she denies significant cough. She denies any  dyspnea, chest pain, or hemoptysis. She denies nausea/vomiting. Denies any diarrhea or constipation. She has mild dry skin but denies rash.  Recently had a restaging CT scan performed.  She is here today for evaluation and to review her scan results before undergoing cycle #7.   MEDICAL HISTORY: Past Medical History:  Diagnosis Date   Depression    GERD (gastroesophageal reflux disease)    Hypercholesterolemia    IFG (impaired fasting glucose)    Nonintractable episodic headache 01/18/2023   Osteopenia    PONV (postoperative nausea and vomiting)     ALLERGIES:  is allergic to codeine.  MEDICATIONS:  Current Outpatient Medications  Medication Sig Dispense Refill   ALPRAZolam (XANAX) 0.5 MG tablet Take 1 tablet (0.5 mg total) by mouth at bedtime as needed for anxiety or sleep. TAKE (1) TABLET BY MOUTH AT BEDTIME AS NEEDED FOR SLEEP. 30 tablet 5   apixaban (ELIQUIS) 5 MG TABS tablet Take 1 tablet (5 mg total) by mouth 2 (two) times daily. 60 tablet 6   benzonatate (TESSALON) 100 MG capsule TAKE 1 CAPSULE (100 MG TOTAL) BY MOUTH THREE TIMES DAILY. 90 capsule 2   butalbital-acetaminophen-caffeine (FIORICET) 50-325-40 MG tablet TAKE 1/2 TABLET BY MOUTH EVERY 6 (SIX) HOURS AS NEEDED FOR HEADACHE. 30 tablet 2   cyclobenzaprine (FLEXERIL) 5 MG tablet Take 1 tablet (5 mg total) by mouth 3 (three) times daily as needed for muscle spasms (neck/shoulder pain). (Patient not taking: Reported on 03/12/2023) 30 tablet 0   docusate sodium (COLACE) 100 MG capsule Take 1 capsule (100 mg total) by  mouth 2 (two) times daily. 60 capsule 0   famotidine (PEPCID) 20 MG tablet Take 1 tablet (20 mg total) by mouth 2 (two) times daily. 60 tablet 6   hydrochlorothiazide (HYDRODIURIL) 25 MG tablet Take 1 tablet (25 mg total) by mouth daily. 30 tablet 6   KLOR-CON M20 20 MEQ tablet TAKE 1 TABLET BY MOUTH 3 TIMES DAILY. 270 tablet 1   lansoprazole (PREVACID) 30 MG capsule Take one po qd for acid reflux 90 capsule 1    levothyroxine (SYNTHROID) 50 MCG tablet Take 1 tablet (50 mcg total) by mouth daily before breakfast. 30 tablet 1   Menthol-Methyl Salicylate (MUSCLE RUB) 10-15 % CREA Apply 1 Application topically 3 (three) times daily to bilateral calves 85 g 0   Multiple Vitamin (MULTIVITAMIN WITH MINERALS) TABS tablet Take 1 tablet by mouth daily. (Patient not taking: Reported on 03/12/2023)     ondansetron (ZOFRAN) 8 MG tablet Take 1 tablet (8 mg total) by mouth every 8 (eight) hours as needed for nausea or vomiting. (Patient not taking: Reported on 03/03/2023) 30 tablet 2   PARoxetine (PAXIL) 40 MG tablet TAKE 1 TABLET BY MOUTH EVERY DAY IN THE MORNING 90 tablet 2   prochlorperazine (COMPAZINE) 10 MG tablet Take 1 tablet (10 mg total) by mouth every 6 (six) hours as needed for nausea or vomiting. 30 tablet 0   rosuvastatin (CRESTOR) 20 MG tablet Take 1 tablet (20 mg total) by mouth daily. 30 tablet 6   senna-docusate (SENOKOT-S) 8.6-50 MG tablet Take 3 tablets by mouth 2 (two) times daily. (Patient not taking: Reported on 03/12/2023) 180 tablet 0   topiramate (TOPAMAX) 100 MG tablet Take 1 tablet (100 mg total) by mouth 2 (two) times daily. 60 tablet 6   No current facility-administered medications for this visit.    SURGICAL HISTORY:  Past Surgical History:  Procedure Laterality Date   ABDOMINAL HYSTERECTOMY     CATARACT EXTRACTION W/PHACO  09/12/2011   Procedure: CATARACT EXTRACTION PHACO AND INTRAOCULAR LENS PLACEMENT (IOC);  Surgeon: Gemma Payor;  Location: [facility];  Service: Ophthalmology;  Laterality: Left;  CDE: 8.91   CATARACT EXTRACTION W/PHACO  11/18/2011   Procedure: CATARACT EXTRACTION PHACO AND INTRAOCULAR LENS PLACEMENT (IOC);  Surgeon: Gemma Payor;  Location: [facility];  Service: Ophthalmology;  Laterality: Right;  CDE:10.26   COLONOSCOPY  2007   Dr. Jena Gauss: internal hemorrhoids, single anal papilla poor prep.    COLONOSCOPY N/A 03/27/2017   Rourk: Normal exam.   ECTOPIC PREGNANCY SURGERY      removal of right tube   ESOPHAGOGASTRODUODENOSCOPY (EGD) WITH PROPOFOL N/A 09/10/2021   Procedure: ESOPHAGOGASTRODUODENOSCOPY (EGD) WITH PROPOFOL;  Surgeon: Corbin Ade, MD;  Location: [facility];  Service: Endoscopy;  Laterality: N/A;  8:15AM   MALONEY DILATION N/A 09/10/2021   Procedure: Elease Hashimoto DILATION;  Surgeon: Corbin Ade, MD;  Location: [facility];  Service: Endoscopy;  Laterality: N/A;   OVARIAN CYST REMOVAL     right   RECTOCELE REPAIR N/A 01/18/2014   Procedure: POSTERIOR REPAIR (RECTOCELE);  Surgeon: Tilda Burrow, MD;  Location: [facility];  Service: Gynecology;  Laterality: N/A;   VAGINAL HYSTERECTOMY N/A 01/18/2014   Procedure: HYSTERECTOMY VAGINAL;  Surgeon: Tilda Burrow, MD;  Location: [facility];  Service: Gynecology;  Laterality: N/A;    REVIEW OF SYSTEMS:   Review of Systems  Constitutional: Negative for appetite change, chills, fatigue, fever and unexpected weight change.  HENT:   Negative for mouth sores, nosebleeds, sore throat  and trouble swallowing.   Eyes: Negative for eye problems and icterus.  Respiratory: Negative for cough, hemoptysis, shortness of breath and wheezing.   Cardiovascular: Negative for chest pain and leg swelling.  Gastrointestinal: Negative for abdominal pain, constipation, diarrhea, nausea and vomiting.  Genitourinary: Negative for bladder incontinence, difficulty urinating, dysuria, frequency and hematuria.   Musculoskeletal: Negative for back pain, gait problem, neck pain and neck stiffness.  Skin: Negative for itching and rash.  Neurological: Negative for dizziness, extremity weakness, gait problem, headaches, light-headedness and seizures.  Hematological: Negative for adenopathy. Does not bruise/bleed easily.  Psychiatric/Behavioral: Negative for confusion, depression and sleep disturbance. The patient is not nervous/anxious.     PHYSICAL EXAMINATION:  There were no vitals taken for this visit.  ECOG PERFORMANCE STATUS: {CHL  ONC ECOG [id number]  Physical Exam  Constitutional: Oriented to person, place, and time and well-developed, well-nourished, and in no distress. No distress.  HENT:  Head: Normocephalic and atraumatic.  Mouth/Throat: Oropharynx is clear and moist. No oropharyngeal exudate.  Eyes: Conjunctivae are normal. Right eye exhibits no discharge. Left eye exhibits no discharge. No scleral icterus.  Neck: Normal range of motion. Neck supple.  Cardiovascular: Normal rate, regular rhythm, normal heart sounds and intact distal pulses.   Pulmonary/Chest: Effort normal and breath sounds normal. No respiratory distress. No wheezes. No rales.  Abdominal: Soft. Bowel sounds are normal. Exhibits no distension and no mass. There is no tenderness.  Musculoskeletal: Normal range of motion. Exhibits no edema.  Lymphadenopathy:    No cervical adenopathy.  Neurological: Alert and oriented to person, place, and time. Exhibits normal muscle tone. Gait normal. Coordination normal.  Skin: Skin is warm and dry. No rash noted. Not diaphoretic. No erythema. No pallor.  Psychiatric: Mood, memory and judgment normal.  Vitals reviewed.  LABORATORY DATA: Lab Results  Component Value Date   WBC 6.5 05/13/2023   HGB 14.8 05/13/2023   HCT 44.7 05/13/2023   MCV 91.6 05/13/2023   PLT 185 05/13/2023      Chemistry      Component Value Date/Time   NA 142 05/13/2023 0948   NA 139 08/02/2022 1112   K 3.5 05/13/2023 0948   CL 109 05/13/2023 0948   CO2 25 05/13/2023 0948   BUN 25 (H) 05/13/2023 0948   BUN 16 08/02/2022 1112   CREATININE 1.08 (H) 05/13/2023 0948      Component Value Date/Time   CALCIUM 10.6 (H) 05/13/2023 0948   ALKPHOS 114 05/13/2023 0948   AST 107 (H) 05/13/2023 0948   ALT 131 (H) 05/13/2023 0948   BILITOT 0.4 05/13/2023 0948       RADIOGRAPHIC STUDIES:  No results found.   ASSESSMENT/PLAN:  This is a very pleasant 77 year old Caucasian female diagnosed with stage IV (T1c, N3, M1 C)  non-small cell lung cancer, adenocarcinoma.  The patient presented with a left upper lobe metastatic neoplasm in addition to extensive lymphadenopathy involving the neck, left hilar, mediastinal, and upper abdominal lymph nodes with multiple subcutaneous/muscular nodules.  She was diagnosed in January 2024.  Her molecular studies show she is positive for K-ras G12 C which can be used in the second line setting.  Her PD-L1 expression is 99%    The patient is going to start systemic/palliative chemotherapy and immunotherapy with carboplatin for AUC of 5, Alimta 500 mg/m, Keytruda 200 mg IV every 3 weeks.  Her first cycle of treatment on 12/30/22.  She started on treatment with single agent immunotherapy with Keytruda 200 mg IV  starting from cycle #2 due to her recent stroke.  She is status post 6 cycles total  Patient recently had a restaging CT scan performed.  She was seen with Dr. Arbutus Ped today.  Dr. Arbutus Ped personally and independently reviewed the scan and discussed results with the patient today.  The scan showed***    Labs were reviewed. ***Check LFTs*** recommend that she *** with cycle #7 today as scheduled  ***Or prednisone   She will be on Eliquis indefinitely due to her bilateral DVTs and strokes in the setting of malignancy.   She will continue to follow with neurology for her strokes. She will reach out to her PCP to extend her PT. She states she has all of her assistive devices she needs.    We will see her back for a follow up visit in 3 weeks for evaluation and repeat blood work before starting cycle #8.    She will continue to use tessalon for cough. Overall, her cough is very mild and she does not cough often.    The patient was advised to call immediately if she has any concerning symptoms in the interval. The patient voices understanding of current disease status and treatment options and is in agreement with the current care plan. All questions were answered. The patient  knows to call the clinic with any problems, questions or concerns. We can certainly see the patient much sooner if necessary     No orders of the defined types were placed in this encounter.    I spent {CHL ONC TIME VISIT - HQION:[id number]} counseling the patient face to face. The total time spent in the appointment was {CHL ONC TIME VISIT - GMWNU:[id number]}.  Rena Sweeden L Elsworth Ledin, PA-C 05/30/23

## 2023-06-01 ENCOUNTER — Other Ambulatory Visit: Payer: Self-pay | Admitting: Family Medicine

## 2023-06-03 ENCOUNTER — Ambulatory Visit: Payer: Medicare HMO | Admitting: Internal Medicine

## 2023-06-03 ENCOUNTER — Other Ambulatory Visit: Payer: Self-pay

## 2023-06-03 ENCOUNTER — Inpatient Hospital Stay: Payer: Medicare HMO

## 2023-06-03 ENCOUNTER — Inpatient Hospital Stay (HOSPITAL_BASED_OUTPATIENT_CLINIC_OR_DEPARTMENT_OTHER): Payer: Medicare HMO | Admitting: Physician Assistant

## 2023-06-03 VITALS — BP 114/63 | HR 66 | Resp 17

## 2023-06-03 VITALS — BP 110/90 | HR 87 | Temp 98.1°F | Resp 16 | Wt 123.9 lb

## 2023-06-03 DIAGNOSIS — R197 Diarrhea, unspecified: Secondary | ICD-10-CM

## 2023-06-03 DIAGNOSIS — Z7952 Long term (current) use of systemic steroids: Secondary | ICD-10-CM | POA: Diagnosis not present

## 2023-06-03 DIAGNOSIS — Z79620 Long term (current) use of immunosuppressive biologic: Secondary | ICD-10-CM | POA: Diagnosis not present

## 2023-06-03 DIAGNOSIS — C3412 Malignant neoplasm of upper lobe, left bronchus or lung: Secondary | ICD-10-CM | POA: Diagnosis not present

## 2023-06-03 DIAGNOSIS — Z8673 Personal history of transient ischemic attack (TIA), and cerebral infarction without residual deficits: Secondary | ICD-10-CM | POA: Diagnosis not present

## 2023-06-03 DIAGNOSIS — C3492 Malignant neoplasm of unspecified part of left bronchus or lung: Secondary | ICD-10-CM

## 2023-06-03 DIAGNOSIS — E876 Hypokalemia: Secondary | ICD-10-CM | POA: Diagnosis not present

## 2023-06-03 DIAGNOSIS — R634 Abnormal weight loss: Secondary | ICD-10-CM | POA: Insufficient documentation

## 2023-06-03 DIAGNOSIS — Z5112 Encounter for antineoplastic immunotherapy: Secondary | ICD-10-CM

## 2023-06-03 DIAGNOSIS — R748 Abnormal levels of other serum enzymes: Secondary | ICD-10-CM | POA: Diagnosis not present

## 2023-06-03 DIAGNOSIS — Z90710 Acquired absence of both cervix and uterus: Secondary | ICD-10-CM | POA: Diagnosis not present

## 2023-06-03 LAB — CMP (CANCER CENTER ONLY)
ALT: 21 U/L (ref 0–44)
AST: 18 U/L (ref 15–41)
Albumin: 4.2 g/dL (ref 3.5–5.0)
Alkaline Phosphatase: 113 U/L (ref 38–126)
Anion gap: 12 (ref 5–15)
BUN: 25 mg/dL — ABNORMAL HIGH (ref 8–23)
CO2: 22 mmol/L (ref 22–32)
Calcium: 10.3 mg/dL (ref 8.9–10.3)
Chloride: 106 mmol/L (ref 98–111)
Creatinine: 1.07 mg/dL — ABNORMAL HIGH (ref 0.44–1.00)
GFR, Estimated: 54 mL/min — ABNORMAL LOW (ref >60)
Glucose, Bld: 121 mg/dL — ABNORMAL HIGH (ref 70–99)
Potassium: 3.2 mmol/L — ABNORMAL LOW (ref 3.5–5.1)
Sodium: 140 mmol/L (ref 135–145)
Total Bilirubin: 0.4 mg/dL (ref 0.3–1.2)
Total Protein: 7.3 g/dL (ref 6.5–8.1)

## 2023-06-03 LAB — CBC WITH DIFFERENTIAL (CANCER CENTER ONLY)
Abs Immature Granulocytes: 0.04 10*3/uL (ref 0.00–0.07)
Basophils Absolute: 0.1 10*3/uL (ref 0.0–0.1)
Basophils Relative: 1 %
Eosinophils Absolute: 0.1 10*3/uL (ref 0.0–0.5)
Eosinophils Relative: 1 %
HCT: 44.4 % (ref 36.0–46.0)
Hemoglobin: 14.9 g/dL (ref 12.0–15.0)
Immature Granulocytes: 1 %
Lymphocytes Relative: 26 %
Lymphs Abs: 2.1 10*3/uL (ref 0.7–4.0)
MCH: 30.1 pg (ref 26.0–34.0)
MCHC: 33.6 g/dL (ref 30.0–36.0)
MCV: 89.7 fL (ref 80.0–100.0)
Monocytes Absolute: 0.5 10*3/uL (ref 0.1–1.0)
Monocytes Relative: 6 %
Neutro Abs: 5.2 10*3/uL (ref 1.7–7.7)
Neutrophils Relative %: 65 %
Platelet Count: 262 10*3/uL (ref 150–400)
RBC: 4.95 MIL/uL (ref 3.87–5.11)
RDW: 13.4 % (ref 11.5–15.5)
WBC Count: 8.1 10*3/uL (ref 4.0–10.5)
nRBC: 0 % (ref 0.0–0.2)

## 2023-06-03 MED ORDER — SODIUM CHLORIDE 0.9 % IV SOLN
200.0000 mg | Freq: Once | INTRAVENOUS | Status: AC
  Administered 2023-06-03: 200 mg via INTRAVENOUS
  Filled 2023-06-03: qty 200

## 2023-06-03 MED ORDER — SODIUM CHLORIDE 0.9 % IV SOLN: Freq: Once | INTRAVENOUS | Status: DC

## 2023-06-03 MED ORDER — SODIUM CHLORIDE 0.9 % IV SOLN: Freq: Once | INTRAVENOUS | Status: AC

## 2023-06-03 NOTE — Patient Instructions (Signed)
[facility]  Discharge Instructions: Thank you for choosing [facility] to provide your oncology and hematology care.   If you have a lab appointment with the Cancer Center, please go directly to the Cancer Center and check in at the registration area.   Wear comfortable clothing and clothing appropriate for easy access to any Portacath or PICC line.   We strive to give you quality time with your provider. You may need to reschedule your appointment if you arrive late (15 or more minutes).  Arriving late affects you and other patients whose appointments are after yours.  Also, if you miss three or more appointments without notifying the office, you may be dismissed from the clinic at the provider's discretion.      For prescription refill requests, have your pharmacy contact our office and allow 72 hours for refills to be completed.    Today you received the following chemotherapy and/or immunotherapy agents: Keytruda      To help prevent nausea and vomiting after your treatment, we encourage you to take your nausea medication as directed.  BELOW ARE SYMPTOMS THAT SHOULD BE REPORTED IMMEDIATELY: *FEVER GREATER THAN 100.4 F (38 C) OR HIGHER *CHILLS OR SWEATING *NAUSEA AND VOMITING THAT IS NOT CONTROLLED WITH YOUR NAUSEA MEDICATION *UNUSUAL SHORTNESS OF BREATH *UNUSUAL BRUISING OR BLEEDING *URINARY PROBLEMS (pain or burning when urinating, or frequent urination) *BOWEL PROBLEMS (unusual diarrhea, constipation, pain near the anus) TENDERNESS IN MOUTH AND THROAT WITH OR WITHOUT PRESENCE OF ULCERS (sore throat, sores in mouth, or a toothache) UNUSUAL RASH, SWELLING OR PAIN  UNUSUAL VAGINAL DISCHARGE OR ITCHING   Items with * indicate a potential emergency and should be followed up as soon as possible or go to the Emergency Department if any problems should occur.  Please show the CHEMOTHERAPY ALERT CARD or IMMUNOTHERAPY ALERT CARD at  check-in to the Emergency Department and triage nurse.  Should you have questions after your visit or need to cancel or reschedule your appointment, please contact [facility]  Dept: [phone]  and follow the prompts.  Office hours are 8:00 a.m. to 4:30 p.m. Monday - Friday. Please note that voicemails left after 4:00 p.m. may not be returned until the following business day.  We are closed weekends and major holidays. You have access to a nurse at all times for urgent questions. Please call the main number to the clinic Dept: [phone] and follow the prompts.   For any non-urgent questions, you may also contact your provider using MyChart. We now offer e-Visits for anyone 18 and older to request care online for non-urgent symptoms. For details visit [email or web address].   Also download the MyChart app! Go to the app store, search "MyChart", open the app, select [facility], and log in with your MyChart username and password.   

## 2023-06-05 ENCOUNTER — Other Ambulatory Visit: Payer: Self-pay | Admitting: Internal Medicine

## 2023-06-05 ENCOUNTER — Telehealth: Payer: Self-pay

## 2023-06-05 MED ORDER — METHYLPREDNISOLONE 4 MG PO TBPK: ORAL_TABLET | ORAL | 0 refills | Status: DC

## 2023-06-05 NOTE — Telephone Encounter (Signed)
Patient called in and stated that she was advised at her last office visit on Tuesday to try Imodium for loose stools.  Patient states that she has been taking it and it has not been effective.  Patient denies abdominal cramps, she is able to eat and drinking plenty of fluids.  Patient requesting something to help with loose stools.  This nurse forwarded to provider for recommendation.

## 2023-06-06 ENCOUNTER — Other Ambulatory Visit: Payer: Self-pay | Admitting: Internal Medicine

## 2023-06-06 ENCOUNTER — Telehealth: Payer: Self-pay

## 2023-06-06 ENCOUNTER — Encounter: Payer: Self-pay | Admitting: Internal Medicine

## 2023-06-06 NOTE — Telephone Encounter (Signed)
This nurse returned call to patients grandson Patricia [name].  This nurse advised per provider loose stools is likely coming from immunotherapy and she will benefit from a steroid better than an Antidiarrhea medication.  Advised that provider called in a prescription for prednisone dose Pak to [facility] in [place].  He acknowledged understanding.  No further questions or concerns noted at this time.

## 2023-06-10 ENCOUNTER — Telehealth: Payer: Self-pay | Admitting: Internal Medicine

## 2023-06-10 NOTE — Telephone Encounter (Signed)
Called patient regarding July/August appointments, left a voicemail. 

## 2023-06-11 ENCOUNTER — Telehealth: Payer: Self-pay | Admitting: Medical Oncology

## 2023-06-11 DIAGNOSIS — R197 Diarrhea, unspecified: Secondary | ICD-10-CM

## 2023-06-11 MED ORDER — METHYLPREDNISOLONE 4 MG PO TBPK
ORAL_TABLET | ORAL | 0 refills | Status: DC
Start: 2023-06-11 — End: 2023-06-26

## 2023-06-11 NOTE — Telephone Encounter (Signed)
I told husband that Dr. Arbutus Ped is going to order another medrol dose pack.

## 2023-06-11 NOTE — Telephone Encounter (Signed)
Diarrhea re-started  a week ago after her last infusion. It  happens every time after she eats. She was out eating and had an episode and had to go home.  Imodium or prednisone did not help .She is keeping hydrated with fluids.

## 2023-06-12 ENCOUNTER — Emergency Department (HOSPITAL_COMMUNITY)
Admission: EM | Admit: 2023-06-12 | Discharge: 2023-06-12 | Disposition: A | Discharge status: Home / Self Care | Payer: Medicare HMO | Attending: Emergency Medicine | Admitting: Emergency Medicine

## 2023-06-12 ENCOUNTER — Encounter (HOSPITAL_COMMUNITY): Payer: Self-pay | Admitting: Emergency Medicine

## 2023-06-12 ENCOUNTER — Other Ambulatory Visit: Payer: Self-pay

## 2023-06-12 ENCOUNTER — Emergency Department (HOSPITAL_COMMUNITY): Payer: Medicare HMO

## 2023-06-12 DIAGNOSIS — D72829 Elevated white blood cell count, unspecified: Secondary | ICD-10-CM | POA: Diagnosis not present

## 2023-06-12 DIAGNOSIS — C3490 Malignant neoplasm of unspecified part of unspecified bronchus or lung: Secondary | ICD-10-CM | POA: Diagnosis not present

## 2023-06-12 DIAGNOSIS — R519 Headache, unspecified: Secondary | ICD-10-CM | POA: Insufficient documentation

## 2023-06-12 DIAGNOSIS — R1111 Vomiting without nausea: Secondary | ICD-10-CM | POA: Diagnosis not present

## 2023-06-12 DIAGNOSIS — R197 Diarrhea, unspecified: Secondary | ICD-10-CM | POA: Diagnosis not present

## 2023-06-12 DIAGNOSIS — Z7901 Long term (current) use of anticoagulants: Secondary | ICD-10-CM | POA: Insufficient documentation

## 2023-06-12 DIAGNOSIS — K529 Noninfective gastroenteritis and colitis, unspecified: Secondary | ICD-10-CM

## 2023-06-12 DIAGNOSIS — Z743 Need for continuous supervision: Secondary | ICD-10-CM | POA: Diagnosis not present

## 2023-06-12 DIAGNOSIS — I6782 Cerebral ischemia: Secondary | ICD-10-CM | POA: Diagnosis not present

## 2023-06-12 DIAGNOSIS — R110 Nausea: Secondary | ICD-10-CM | POA: Diagnosis not present

## 2023-06-12 DIAGNOSIS — G4489 Other headache syndrome: Secondary | ICD-10-CM | POA: Diagnosis not present

## 2023-06-12 LAB — CBC WITH DIFFERENTIAL/PLATELET
Abs Immature Granulocytes: 0.07 10*3/uL (ref 0.00–0.07)
Basophils Absolute: 0.1 10*3/uL (ref 0.0–0.1)
Basophils Relative: 1 %
Eosinophils Absolute: 0 10*3/uL (ref 0.0–0.5)
Eosinophils Relative: 0 %
HCT: 41.6 % (ref 36.0–46.0)
Hemoglobin: 13.6 g/dL (ref 12.0–15.0)
Immature Granulocytes: 1 %
Lymphocytes Relative: 18 %
Lymphs Abs: 2 10*3/uL (ref 0.7–4.0)
MCH: 30.3 pg (ref 26.0–34.0)
MCHC: 32.7 g/dL (ref 30.0–36.0)
MCV: 92.7 fL (ref 80.0–100.0)
Monocytes Absolute: 0.9 10*3/uL (ref 0.1–1.0)
Monocytes Relative: 8 %
Neutro Abs: 8.2 10*3/uL — ABNORMAL HIGH (ref 1.7–7.7)
Neutrophils Relative %: 72 %
Platelets: 265 10*3/uL (ref 150–400)
RBC: 4.49 MIL/uL (ref 3.87–5.11)
RDW: 13.7 % (ref 11.5–15.5)
WBC: 11.3 10*3/uL — ABNORMAL HIGH (ref 4.0–10.5)
nRBC: 0 % (ref 0.0–0.2)

## 2023-06-12 LAB — COMPREHENSIVE METABOLIC PANEL
ALT: 52 U/L — ABNORMAL HIGH (ref 0–44)
AST: 31 U/L (ref 15–41)
Albumin: 3.9 g/dL (ref 3.5–5.0)
Alkaline Phosphatase: 145 U/L — ABNORMAL HIGH (ref 38–126)
Anion gap: 12 (ref 5–15)
BUN: 14 mg/dL (ref 8–23)
CO2: 22 mmol/L (ref 22–32)
Calcium: 10 mg/dL (ref 8.9–10.3)
Chloride: 105 mmol/L (ref 98–111)
Creatinine, Ser: 0.78 mg/dL (ref 0.44–1.00)
GFR, Estimated: >60 mL/min (ref >60)
Glucose, Bld: 164 mg/dL — ABNORMAL HIGH (ref 70–99)
Potassium: 3.2 mmol/L — ABNORMAL LOW (ref 3.5–5.1)
Sodium: 139 mmol/L (ref 135–145)
Total Bilirubin: 0.5 mg/dL (ref 0.3–1.2)
Total Protein: 7 g/dL (ref 6.5–8.1)

## 2023-06-12 MED ORDER — LACTATED RINGERS IV BOLUS
1000.0000 mL | Freq: Once | INTRAVENOUS | Status: AC
  Administered 2023-06-12: 1000 mL via INTRAVENOUS

## 2023-06-12 MED ORDER — PROCHLORPERAZINE EDISYLATE 10 MG/2ML IJ SOLN
10.0000 mg | Freq: Once | INTRAMUSCULAR | Status: AC
  Administered 2023-06-12: 10 mg via INTRAVENOUS
  Filled 2023-06-12: qty 2

## 2023-06-12 MED ORDER — KETOROLAC TROMETHAMINE 30 MG/ML IJ SOLN
15.0000 mg | Freq: Once | INTRAMUSCULAR | Status: AC
  Administered 2023-06-12: 15 mg via INTRAVENOUS
  Filled 2023-06-12: qty 1

## 2023-06-12 NOTE — ED Triage Notes (Signed)
Pt has a hx of cancer receiving radiation tx. Since last month's tx,  pt c/o diarrhea. C/o Headache and emesis that started this morning. Pt has taken home medications for nausea/vomiting but has not helped.

## 2023-06-12 NOTE — ED Provider Notes (Signed)
[name]  Provider Note  CSN: [id number] Arrival date & time: 06/12/23 0454  History Chief Complaint  Patient presents with   Headache    Patricia [name] is a 77 y.o. female with history of Stage IV lung cancer, has had about 2-3 weeks of persistent diarrhea since last infusion, not improved with imodium or medrol dosepak prescribed by Oncology. She reports in the last 12 hours she has also begun having nausea, and diffuse throbbing headache, associated with photophobia. She has history of similar headaches, tried to taking Fioricet at home without improvement. No fever. No abdominal pain. No blood in stool or vomit. Husband at bedside helps supplement history.    Home Medications Prior to Admission medications   Medication Sig Start Date End Date Taking? Authorizing Provider  ALPRAZolam Prudy Feeler) 0.5 MG tablet Take 1 tablet (0.5 mg total) by mouth at bedtime as needed for anxiety or sleep. TAKE (1) TABLET BY MOUTH AT BEDTIME AS NEEDED FOR SLEEP. 02/12/23   [name], MD  apixaban (ELIQUIS) 5 MG TABS tablet Take 1 tablet (5 mg total) by mouth 2 (two) times daily. 02/12/23   [name], MD  benzonatate (TESSALON) 100 MG capsule TAKE 1 CAPSULE (100 MG TOTAL) BY MOUTH THREE TIMES DAILY. 04/27/23   Heilingoetter, Cassandra L, PA-C  butalbital-acetaminophen-caffeine (FIORICET) 50-325-40 MG tablet TAKE 1/2 TABLET BY MOUTH EVERY 6 (SIX) HOURS AS NEEDED FOR HEADACHE. 04/21/23   [name], MD  cyclobenzaprine (FLEXERIL) 5 MG tablet Take 1 tablet (5 mg total) by mouth 3 (three) times daily as needed for muscle spasms (neck/shoulder pain). 02/04/23   Love, Evlyn Kanner, PA-C  docusate sodium (COLACE) 100 MG capsule Take 1 capsule (100 mg total) by mouth 2 (two) times daily. 02/04/23   Love, Evlyn Kanner, PA-C  famotidine (PEPCID) 20 MG tablet Take 1 tablet (20 mg total) by mouth 2 (two) times daily. 02/12/23   [name], MD  hydrochlorothiazide  (HYDRODIURIL) 25 MG tablet Take 1 tablet (25 mg total) by mouth daily. 02/12/23   Luking, Jonna Coup, MD  KLOR-CON M20 20 MEQ tablet TAKE 1 TABLET BY MOUTH 3 TIMES DAILY. 03/31/23   [name], MD  lansoprazole (PREVACID) 30 MG capsule TAKE ONE CAPSULE BY MOUTH DAILY FOR ACID REFLUX 06/03/23   [name], MD  levothyroxine (SYNTHROID) 50 MCG tablet TAKE 1 TABLET BY MOUTH DAILY BEFORE BREAKFAST 06/06/23   Si Gaul, MD  Menthol-Methyl Salicylate (MUSCLE RUB) 10-15 % CREA Apply 1 Application topically 3 (three) times daily to bilateral calves 02/04/23   Love, Evlyn Kanner, PA-C  methylPREDNISolone (MEDROL DOSEPAK) 4 MG TBPK tablet Use as instructed 06/11/23   Si Gaul, MD  Multiple Vitamin (MULTIVITAMIN WITH MINERALS) TABS tablet Take 1 tablet by mouth daily.    [provider]  ondansetron (ZOFRAN) 8 MG tablet Take 1 tablet (8 mg total) by mouth every 8 (eight) hours as needed for nausea or vomiting. 02/11/23   Heilingoetter, Cassandra L, PA-C  PARoxetine (PAXIL) 40 MG tablet TAKE 1 TABLET BY MOUTH EVERY DAY IN THE MORNING 04/30/23   [name], MD  prochlorperazine (COMPAZINE) 10 MG tablet Take 1 tablet (10 mg total) by mouth every 6 (six) hours as needed for nausea or vomiting. 02/04/23   Love, Evlyn Kanner, PA-C  rosuvastatin (CRESTOR) 20 MG tablet Take 1 tablet (20 mg total) by mouth daily. 02/12/23   [name], MD  senna-docusate (SENOKOT-S)  8.6-50 MG tablet Take 3 tablets by mouth 2 (two) times daily. 02/04/23   Love, Evlyn Kanner, PA-C  topiramate (TOPAMAX) 100 MG tablet Take 1 tablet (100 mg total) by mouth 2 (two) times daily. 02/12/23   [name], MD     Allergies    Codeine   Review of Systems   Review of Systems Please see HPI for pertinent positives and negatives  Physical Exam BP 124/78   Pulse 64   Temp (!) 97 F (36.1 C) (Axillary)   Resp 18   Ht 4\' 10"  (1.473 m)   Wt 56.2 kg   SpO2 96%   BMI 25.90 kg/m   Physical Exam Vitals and nursing note  reviewed.  Constitutional:      Appearance: Normal appearance.  HENT:     Head: Normocephalic and atraumatic.     Nose: Nose normal.     Mouth/Throat:     Mouth: Mucous membranes are moist.  Eyes:     Extraocular Movements: Extraocular movements intact.     Conjunctiva/sclera: Conjunctivae normal.  Cardiovascular:     Rate and Rhythm: Normal rate.  Pulmonary:     Effort: Pulmonary effort is normal.     Breath sounds: Normal breath sounds.  Abdominal:     General: Abdomen is flat.     Palpations: Abdomen is soft.     Tenderness: There is no abdominal tenderness. There is no guarding.  Musculoskeletal:        General: No swelling. Normal range of motion.     Cervical back: Neck supple.  Skin:    General: Skin is warm and dry.  Neurological:     General: No focal deficit present.     Mental Status: She is alert and oriented to person, place, and time.     Cranial Nerves: No cranial nerve deficit.     Sensory: No sensory deficit.     Motor: No weakness.  Psychiatric:        Mood and Affect: Mood normal.     ED Results / Procedures / Treatments   EKG None  Procedures Procedures  Medications Ordered in the ED Medications  lactated ringers bolus 1,000 mL (1,000 mLs Intravenous New Bag/Given 06/12/23 0535)  ketorolac (TORADOL) 30 MG/ML injection 15 mg (15 mg Intravenous Given 06/12/23 0541)  prochlorperazine (COMPAZINE) injection 10 mg (10 mg Intravenous Given 06/12/23 0541)    Initial Impression and Plan  Patient here with chronic diarrhea, now with headache and vomiting. Could be due to dehydration vs unrelated migraine vs new brain mets. Will check labs, send for CT. Toradol/Compazine for headache relief and IVF.   ED Course   Clinical Course as of 06/12/23 0629  Thu Jun 12, 2023  1610 CBC with mild leukocytosis.  [CS]  0558 CMP is unremarkable.  [CS]  0604 I personally viewed the images from radiology studies and agree with radiologist interpretation:  CT neg for  acute process or new mets [CS]  0627 Patient feeling much better. Would like to go home when IVF are complete. Recommend she discuss diarrhea management with oncology. Encouraged to drink plenty of fluids to replace losses. RTED for any other concerns.  [CS]    Clinical Course User Index [CS] Pollyann Savoy, MD     MDM Rules/Calculators/A&P Medical Decision Making Given presenting complaint, I considered that admission might be necessary. After review of results from ED lab and/or imaging studies, admission to the hospital is not indicated at this time.  Problems Addressed: Acute nonintractable headache, unspecified headache type: acute illness or injury Chronic diarrhea: chronic illness or injury  Amount and/or Complexity of Data Reviewed Labs: ordered. Decision-making details documented in ED Course. Radiology: ordered and independent interpretation performed. Decision-making details documented in ED Course.  Risk Prescription drug management. Decision regarding hospitalization.     Final Clinical Impression(s) / ED Diagnoses Final diagnoses:  Acute nonintractable headache, unspecified headache type  Chronic diarrhea    Rx / DC Orders ED Discharge Orders     None        Pollyann Savoy, MD 06/12/23 [phone]

## 2023-06-14 ENCOUNTER — Other Ambulatory Visit: Payer: Self-pay | Admitting: Family Medicine

## 2023-06-17 ENCOUNTER — Ambulatory Visit: Payer: Medicare HMO | Admitting: Family Medicine

## 2023-06-19 ENCOUNTER — Telehealth: Payer: Self-pay | Admitting: *Deleted

## 2023-06-19 NOTE — Telephone Encounter (Signed)
Transition Care Management Unsuccessful Follow-up Telephone Call  Date of discharge and from where:  [facility] 06/12/2023  Attempts:  1st Attempt  Reason for unsuccessful TCM follow-up call:  Declined to talk

## 2023-06-20 ENCOUNTER — Telehealth: Payer: Self-pay | Admitting: Medical Oncology

## 2023-06-20 NOTE — Telephone Encounter (Signed)
Diarrhea persists 3 x today .She is dizzy and light head. Completed medrol dose pack today for diarrhe. Cassie feels the pt needs labs, stool testing , IVF  and to be evaluated . Unfortunately our lab is currently closed and won't be open until Monday. Therefore pt husband was instructed to get pt to ED.

## 2023-06-22 ENCOUNTER — Emergency Department (HOSPITAL_COMMUNITY): Payer: Medicare HMO

## 2023-06-22 ENCOUNTER — Inpatient Hospital Stay (HOSPITAL_COMMUNITY)
Admission: EM | Admit: 2023-06-22 | Discharge: 2023-06-26 | DRG: 392 | Disposition: A | Discharge status: Home / Self Care | Payer: Medicare HMO | Attending: Internal Medicine | Admitting: Internal Medicine

## 2023-06-22 ENCOUNTER — Other Ambulatory Visit: Payer: Self-pay

## 2023-06-22 ENCOUNTER — Encounter (HOSPITAL_COMMUNITY): Payer: Self-pay | Admitting: Emergency Medicine

## 2023-06-22 DIAGNOSIS — E039 Hypothyroidism, unspecified: Secondary | ICD-10-CM | POA: Diagnosis not present

## 2023-06-22 DIAGNOSIS — R7402 Elevation of levels of lactic acid dehydrogenase (LDH): Secondary | ICD-10-CM | POA: Diagnosis not present

## 2023-06-22 DIAGNOSIS — Z87891 Personal history of nicotine dependence: Secondary | ICD-10-CM

## 2023-06-22 DIAGNOSIS — E782 Mixed hyperlipidemia: Secondary | ICD-10-CM | POA: Diagnosis present

## 2023-06-22 DIAGNOSIS — Z803 Family history of malignant neoplasm of breast: Secondary | ICD-10-CM | POA: Diagnosis not present

## 2023-06-22 DIAGNOSIS — E860 Dehydration: Secondary | ICD-10-CM | POA: Diagnosis present

## 2023-06-22 DIAGNOSIS — R197 Diarrhea, unspecified: Principal | ICD-10-CM

## 2023-06-22 DIAGNOSIS — I679 Cerebrovascular disease, unspecified: Secondary | ICD-10-CM | POA: Diagnosis not present

## 2023-06-22 DIAGNOSIS — Z885 Allergy status to narcotic agent status: Secondary | ICD-10-CM

## 2023-06-22 DIAGNOSIS — M8580 Other specified disorders of bone density and structure, unspecified site: Secondary | ICD-10-CM | POA: Diagnosis not present

## 2023-06-22 DIAGNOSIS — R9431 Abnormal electrocardiogram [ECG] [EKG]: Secondary | ICD-10-CM | POA: Diagnosis present

## 2023-06-22 DIAGNOSIS — R319 Hematuria, unspecified: Secondary | ICD-10-CM | POA: Diagnosis present

## 2023-06-22 DIAGNOSIS — C3492 Malignant neoplasm of unspecified part of left bronchus or lung: Secondary | ICD-10-CM | POA: Diagnosis present

## 2023-06-22 DIAGNOSIS — Z8673 Personal history of transient ischemic attack (TIA), and cerebral infarction without residual deficits: Secondary | ICD-10-CM | POA: Diagnosis not present

## 2023-06-22 DIAGNOSIS — E876 Hypokalemia: Secondary | ICD-10-CM | POA: Diagnosis not present

## 2023-06-22 DIAGNOSIS — K449 Diaphragmatic hernia without obstruction or gangrene: Secondary | ICD-10-CM | POA: Diagnosis not present

## 2023-06-22 DIAGNOSIS — Z8262 Family history of osteoporosis: Secondary | ICD-10-CM | POA: Diagnosis not present

## 2023-06-22 DIAGNOSIS — I10 Essential (primary) hypertension: Secondary | ICD-10-CM | POA: Diagnosis present

## 2023-06-22 DIAGNOSIS — Z66 Do not resuscitate: Secondary | ICD-10-CM | POA: Diagnosis present

## 2023-06-22 DIAGNOSIS — Z8249 Family history of ischemic heart disease and other diseases of the circulatory system: Secondary | ICD-10-CM | POA: Diagnosis not present

## 2023-06-22 DIAGNOSIS — K529 Noninfective gastroenteritis and colitis, unspecified: Secondary | ICD-10-CM | POA: Diagnosis not present

## 2023-06-22 DIAGNOSIS — K219 Gastro-esophageal reflux disease without esophagitis: Secondary | ICD-10-CM | POA: Diagnosis not present

## 2023-06-22 DIAGNOSIS — F32A Depression, unspecified: Secondary | ICD-10-CM | POA: Diagnosis present

## 2023-06-22 DIAGNOSIS — N179 Acute kidney failure, unspecified: Secondary | ICD-10-CM | POA: Diagnosis present

## 2023-06-22 DIAGNOSIS — Z79890 Hormone replacement therapy (postmenopausal): Secondary | ICD-10-CM

## 2023-06-22 DIAGNOSIS — R1030 Lower abdominal pain, unspecified: Secondary | ICD-10-CM | POA: Diagnosis not present

## 2023-06-22 DIAGNOSIS — D630 Anemia in neoplastic disease: Secondary | ICD-10-CM | POA: Diagnosis present

## 2023-06-22 DIAGNOSIS — R531 Weakness: Secondary | ICD-10-CM

## 2023-06-22 DIAGNOSIS — K922 Gastrointestinal hemorrhage, unspecified: Secondary | ICD-10-CM | POA: Diagnosis not present

## 2023-06-22 DIAGNOSIS — C3432 Malignant neoplasm of lower lobe, left bronchus or lung: Secondary | ICD-10-CM | POA: Diagnosis not present

## 2023-06-22 DIAGNOSIS — R1031 Right lower quadrant pain: Secondary | ICD-10-CM | POA: Diagnosis not present

## 2023-06-22 DIAGNOSIS — Z8041 Family history of malignant neoplasm of ovary: Secondary | ICD-10-CM

## 2023-06-22 DIAGNOSIS — Z86718 Personal history of other venous thrombosis and embolism: Secondary | ICD-10-CM

## 2023-06-22 DIAGNOSIS — R1084 Generalized abdominal pain: Secondary | ICD-10-CM | POA: Diagnosis not present

## 2023-06-22 DIAGNOSIS — K52832 Lymphocytic colitis: Principal | ICD-10-CM | POA: Diagnosis present

## 2023-06-22 DIAGNOSIS — Z7901 Long term (current) use of anticoagulants: Secondary | ICD-10-CM

## 2023-06-22 DIAGNOSIS — C3490 Malignant neoplasm of unspecified part of unspecified bronchus or lung: Secondary | ICD-10-CM | POA: Diagnosis not present

## 2023-06-22 DIAGNOSIS — Z79899 Other long term (current) drug therapy: Secondary | ICD-10-CM | POA: Diagnosis not present

## 2023-06-22 DIAGNOSIS — Z90710 Acquired absence of both cervix and uterus: Secondary | ICD-10-CM

## 2023-06-22 DIAGNOSIS — Z743 Need for continuous supervision: Secondary | ICD-10-CM | POA: Diagnosis not present

## 2023-06-22 DIAGNOSIS — R110 Nausea: Secondary | ICD-10-CM | POA: Diagnosis not present

## 2023-06-22 HISTORY — DX: Cerebral infarction, unspecified: I63.9

## 2023-06-22 LAB — URINALYSIS, ROUTINE W REFLEX MICROSCOPIC
Bilirubin Urine: NEGATIVE
Glucose, UA: NEGATIVE mg/dL
Ketones, ur: NEGATIVE mg/dL
Nitrite: NEGATIVE
Protein, ur: 30 mg/dL — AB
Specific Gravity, Urine: 1.014 (ref 1.005–1.030)
Trans Epithel, UA: 2
pH: 5 (ref 5.0–8.0)

## 2023-06-22 LAB — COMPREHENSIVE METABOLIC PANEL
ALT: 24 U/L (ref 0–44)
AST: 25 U/L (ref 15–41)
Albumin: 3.8 g/dL (ref 3.5–5.0)
Alkaline Phosphatase: 101 U/L (ref 38–126)
Anion gap: 11 (ref 5–15)
BUN: 16 mg/dL (ref 8–23)
CO2: 29 mmol/L (ref 22–32)
Calcium: 9.4 mg/dL (ref 8.9–10.3)
Chloride: 93 mmol/L — ABNORMAL LOW (ref 98–111)
Creatinine, Ser: 1.18 mg/dL — ABNORMAL HIGH (ref 0.44–1.00)
GFR, Estimated: 48 mL/min — ABNORMAL LOW (ref >60)
Glucose, Bld: 117 mg/dL — ABNORMAL HIGH (ref 70–99)
Potassium: 2.5 mmol/L — CL (ref 3.5–5.1)
Sodium: 133 mmol/L — ABNORMAL LOW (ref 135–145)
Total Bilirubin: 0.6 mg/dL (ref 0.3–1.2)
Total Protein: 7 g/dL (ref 6.5–8.1)

## 2023-06-22 LAB — CBC
HCT: 43.8 % (ref 36.0–46.0)
Hemoglobin: 14.4 g/dL (ref 12.0–15.0)
MCH: 30 pg (ref 26.0–34.0)
MCHC: 32.9 g/dL (ref 30.0–36.0)
MCV: 91.3 fL (ref 80.0–100.0)
Platelets: 216 10*3/uL (ref 150–400)
RBC: 4.8 MIL/uL (ref 3.87–5.11)
RDW: 13.1 % (ref 11.5–15.5)
WBC: 5.6 10*3/uL (ref 4.0–10.5)
nRBC: 0 % (ref 0.0–0.2)

## 2023-06-22 LAB — MAGNESIUM: Magnesium: 2.1 mg/dL (ref 1.7–2.4)

## 2023-06-22 LAB — LIPASE, BLOOD: Lipase: 26 U/L (ref 11–51)

## 2023-06-22 MED ORDER — POTASSIUM CHLORIDE 10 MEQ/100ML IV SOLN
10.0000 meq | INTRAVENOUS | Status: DC
  Administered 2023-06-22 (×3): 10 meq via INTRAVENOUS
  Filled 2023-06-22 (×3): qty 100

## 2023-06-22 MED ORDER — SODIUM CHLORIDE 0.9 % IV BOLUS
1000.0000 mL | Freq: Once | INTRAVENOUS | Status: AC
  Administered 2023-06-22: 1000 mL via INTRAVENOUS

## 2023-06-22 MED ORDER — ONDANSETRON HCL 4 MG/2ML IJ SOLN
4.0000 mg | Freq: Once | INTRAMUSCULAR | Status: AC | PRN
  Administered 2023-06-22: 4 mg via INTRAVENOUS
  Filled 2023-06-22: qty 2

## 2023-06-22 MED ORDER — PANTOPRAZOLE SODIUM 40 MG IV SOLR
40.0000 mg | Freq: Once | INTRAVENOUS | Status: AC
  Administered 2023-06-22: 40 mg via INTRAVENOUS
  Filled 2023-06-22: qty 10

## 2023-06-22 MED ORDER — IOHEXOL 300 MG/ML  SOLN
100.0000 mL | Freq: Once | INTRAMUSCULAR | Status: AC | PRN
  Administered 2023-06-22: 100 mL via INTRAVENOUS

## 2023-06-22 NOTE — ED Notes (Signed)
Patient transported to CT 

## 2023-06-22 NOTE — ED Provider Notes (Signed)
[place] EMERGENCY DEPARTMENT AT [facility] Provider Note   CSN: [id number] Arrival date & time: 06/22/23  1855     History  Chief Complaint  Patient presents with   Abdominal Pain    Patricia [name] is a 77 y.o. female.  He has a history of lung cancer and is under active chemotherapy with Dr. Shirline Frees.  She has been having diarrhea for the last 3 weeks.  They think it is related to the [facility].  They put her on a Medrol Dosepak.  Patient continues to have diarrhea poor p.o. intake nausea vomiting.  Today she had some right lower abdominal pain.  No fevers.  She said she feels very dehydrated and was told to come up here for further evaluation.  She was seen about 10 days ago for diarrhea headache nausea.  The history is provided by the patient.  Abdominal Pain Pain location:  RLQ Pain quality: aching   Pain radiates to:  Does not radiate Pain severity:  Moderate Onset quality:  Gradual Duration:  1 day Timing:  Constant Progression:  Unchanged Chronicity:  New Context: not trauma   Relieved by:  None tried Worsened by:  Nothing Ineffective treatments:  None tried Associated symptoms: diarrhea, fatigue, nausea and vomiting   Associated symptoms: no chest pain, no fever, no hematemesis, no hematochezia, no melena and no shortness of breath        Home Medications Prior to Admission medications   Medication Sig Start Date End Date Taking? Authorizing Provider  ALPRAZolam Prudy Feeler) 0.5 MG tablet Take 1 tablet (0.5 mg total) by mouth at bedtime as needed for anxiety or sleep. TAKE (1) TABLET BY MOUTH AT BEDTIME AS NEEDED FOR SLEEP. 02/12/23   [name], MD  apixaban (ELIQUIS) 5 MG TABS tablet Take 1 tablet (5 mg total) by mouth 2 (two) times daily. 02/12/23   [name], MD  benzonatate (TESSALON) 100 MG capsule TAKE 1 CAPSULE (100 MG TOTAL) BY MOUTH THREE TIMES DAILY. 04/27/23   Heilingoetter, Cassandra L, PA-C  butalbital-acetaminophen-caffeine  (FIORICET) 50-325-40 MG tablet TAKE 1/2 TABLET BY MOUTH EVERY 6 (SIX) HOURS AS NEEDED FOR HEADACHE. 04/21/23   [name], MD  cyclobenzaprine (FLEXERIL) 5 MG tablet Take 1 tablet (5 mg total) by mouth 3 (three) times daily as needed for muscle spasms (neck/shoulder pain). 02/04/23   Love, Evlyn Kanner, PA-C  docusate sodium (COLACE) 100 MG capsule Take 1 capsule (100 mg total) by mouth 2 (two) times daily. 02/04/23   Love, Evlyn Kanner, PA-C  famotidine (PEPCID) 20 MG tablet Take 1 tablet (20 mg total) by mouth 2 (two) times daily. 02/12/23   [name], MD  hydrochlorothiazide (HYDRODIURIL) 25 MG tablet Take 1 tablet (25 mg total) by mouth daily. 02/12/23   Luking, Jonna Coup, MD  KLOR-CON M20 20 MEQ tablet TAKE 1 TABLET BY MOUTH 3 TIMES DAILY. 03/31/23   [name], MD  lansoprazole (PREVACID) 30 MG capsule TAKE ONE CAPSULE BY MOUTH DAILY FOR ACID REFLUX 06/03/23   [name], MD  levothyroxine (SYNTHROID) 50 MCG tablet TAKE 1 TABLET BY MOUTH DAILY BEFORE BREAKFAST 06/06/23   Si Gaul, MD  Menthol-Methyl Salicylate (MUSCLE RUB) 10-15 % CREA Apply 1 Application topically 3 (three) times daily to bilateral calves 02/04/23   Love, Evlyn Kanner, PA-C  methylPREDNISolone (MEDROL DOSEPAK) 4 MG TBPK tablet Use as instructed 06/11/23   Si Gaul, MD  Multiple Vitamin (MULTIVITAMIN WITH MINERALS) TABS tablet Take 1  tablet by mouth daily.    [provider]  ondansetron (ZOFRAN) 8 MG tablet Take 1 tablet (8 mg total) by mouth every 8 (eight) hours as needed for nausea or vomiting. 02/11/23   Heilingoetter, Cassandra L, PA-C  PARoxetine (PAXIL) 40 MG tablet TAKE 1 TABLET BY MOUTH EVERY DAY IN THE MORNING 04/30/23   [name], MD  prochlorperazine (COMPAZINE) 10 MG tablet Take 1 tablet (10 mg total) by mouth every 6 (six) hours as needed for nausea or vomiting. 02/04/23   Love, Evlyn Kanner, PA-C  rosuvastatin (CRESTOR) 20 MG tablet Take 1 tablet (20 mg total) by mouth daily. 02/12/23   [name], MD  senna-docusate (SENOKOT-S) 8.6-50 MG tablet Take 3 tablets by mouth 2 (two) times daily. 02/04/23   Love, Evlyn Kanner, PA-C  topiramate (TOPAMAX) 100 MG tablet Take 1 tablet (100 mg total) by mouth 2 (two) times daily. 02/12/23   [name], MD      Allergies    Codeine    Review of Systems   Review of Systems  Constitutional:  Positive for fatigue. Negative for fever.  Respiratory:  Negative for shortness of breath.   Cardiovascular:  Negative for chest pain.  Gastrointestinal:  Positive for abdominal pain, diarrhea, nausea and vomiting. Negative for hematemesis, hematochezia and melena.    Physical Exam Updated Vital Signs BP (!) 122/93   Pulse 65   Temp 98.3 F (36.8 C) (Oral)   Resp 17   Ht 4\' 10"  (1.473 m)   Wt 56.2 kg   SpO2 95%   BMI 25.89 kg/m  Physical Exam Vitals and nursing note reviewed.  Constitutional:      General: She is not in acute distress.    Appearance: Normal appearance. She is well-developed.  HENT:     Head: Normocephalic and atraumatic.  Eyes:     Conjunctiva/sclera: Conjunctivae normal.  Cardiovascular:     Rate and Rhythm: Normal rate and regular rhythm.     Heart sounds: No murmur heard. Pulmonary:     Effort: Pulmonary effort is normal. No respiratory distress.     Breath sounds: Normal breath sounds.  Abdominal:     Palpations: Abdomen is soft.     Tenderness: There is abdominal tenderness in the right lower quadrant.  Musculoskeletal:        General: No deformity. Normal range of motion.     Cervical back: Neck supple.  Skin:    General: Skin is warm and dry.     Capillary Refill: Capillary refill takes less than 2 seconds.  Neurological:     General: No focal deficit present.     Mental Status: She is alert.     ED Results / Procedures / Treatments   Labs (all labs ordered are listed, but only abnormal results are displayed) Labs Reviewed  COMPREHENSIVE METABOLIC PANEL - Abnormal; Notable for the following  components:      Result Value   Sodium 133 (*)    Potassium 2.5 (*)    Chloride 93 (*)    Glucose, Bld 117 (*)    Creatinine, Ser 1.18 (*)    GFR, Estimated 48 (*)    All other components within normal limits  URINALYSIS, ROUTINE W REFLEX MICROSCOPIC - Abnormal; Notable for the following components:   APPearance CLOUDY (*)    Hgb urine dipstick MODERATE (*)    Protein, ur 30 (*)    Leukocytes,Ua LARGE (*)    Bacteria, UA MANY (*)  All other components within normal limits  COMPREHENSIVE METABOLIC PANEL - Abnormal; Notable for the following components:   Sodium 134 (*)    Glucose, Bld 112 (*)    Creatinine, Ser 1.02 (*)    Calcium 8.2 (*)    Total Protein 5.3 (*)    Albumin 2.9 (*)    GFR, Estimated 57 (*)    All other components within normal limits  CBC - Abnormal; Notable for the following components:   Hemoglobin 11.7 (*)    HCT 35.9 (*)    All other components within normal limits  C DIFFICILE QUICK SCREEN W PCR REFLEX    GASTROINTESTINAL PANEL BY PCR, STOOL (REPLACES STOOL CULTURE)  LIPASE, BLOOD  CBC  MAGNESIUM  MAGNESIUM  PHOSPHORUS  LACTIC ACID, PLASMA    EKG EKG Interpretation Date/Time:  Sunday June 22 2023 19:34:42 EDT Ventricular Rate:  64 PR Interval:  128 QRS Duration:  78 QT Interval:  512 QTC Calculation: 529 R Axis:   45  Text Interpretation: Sinus rhythm Low voltage, precordial leads Abnormal T, consider ischemia, lateral leads Prolonged QT interval QTc prolonged and nonspecific Ts new from prior 2/24 Confirmed by Meridee Score [id number]) on 06/22/2023 7:38:33 PM  Radiology CT ABDOMEN PELVIS W CONTRAST  Result Date: 06/22/2023 CLINICAL DATA:  Right lower quadrant abdominal pain. History of non-small cell lung cancer. EXAM: CT ABDOMEN AND PELVIS WITH CONTRAST TECHNIQUE: Multidetector CT imaging of the abdomen and pelvis was performed using the standard protocol following bolus administration of intravenous contrast. RADIATION DOSE REDUCTION: This  exam was performed according to the departmental dose-optimization program which includes automated exposure control, adjustment of the mA and/or kV according to patient size and/or use of iterative reconstruction technique. CONTRAST:  OMNIPAQUE IOHEXOL 300 MG/ML  SOLN COMPARISON:  CT abdomen and pelvis 05/30/2023.  PET-CT 12/06/2022 FINDINGS: Lower chest: Left lower lobe pulmonary nodule measuring 1 cm appears unchanged. Hepatobiliary: No focal liver abnormality is seen. No gallstones, gallbladder wall thickening, or biliary dilatation. Pancreas: Unremarkable. No pancreatic ductal dilatation or surrounding inflammatory changes. Spleen: Normal in size without focal abnormality. Adrenals/Urinary Tract: Adrenal glands are unremarkable. Kidneys are normal, without renal calculi, focal lesion, or hydronephrosis. Small left bladder diverticulum is again seen. The bladder is otherwise within normal limits. Stomach/Bowel: Large hiatal is again seen containing the proximal stomach. Stomach is nondilated. Small bowel loops are within normal limits. There is wall thickening of the splenic flexure to the level the rectum without surrounding inflammation. The appendix is within normal limits. There is no pneumatosis, bowel obstruction or free air. Vascular/Lymphatic: Aortic atherosclerosis. No enlarged abdominal or pelvic lymph nodes. Reproductive: Status post hysterectomy. No adnexal masses. Other: No abdominal wall hernia or abnormality. No abdominopelvic ascites. Musculoskeletal: Degenerative changes affect the spine. IMPRESSION: 1. Findings compatible with nonspecific colitis from the splenic flexure to the level of the rectum. No bowel obstruction or free air. 2. Stable large hiatal hernia. 3. Stable 1 cm left lower lobe pulmonary nodule. Aortic Atherosclerosis (ICD10-I70.0). Electronically Signed   By: Darliss Cheney M.D.   On: 06/22/2023 21:46    Procedures Procedures    Medications Ordered in ED Medications   potassium chloride 10 mEq in 100 mL IVPB (0 mEq Intravenous Stopped 06/22/23 2327)  HYDROmorphone (DILAUDID) injection 0.5 mg (has no administration in time range)  0.9 %  sodium chloride infusion ( Intravenous New Bag/Given 06/23/23 0212)  prochlorperazine (COMPAZINE) injection 10 mg (has no administration in time range)  acetaminophen (TYLENOL) tablet 650 mg (  650 mg Oral Given 06/23/23 0156)    Or  acetaminophen (TYLENOL) suppository 650 mg ( Rectal See Alternative 06/23/23 0156)  ondansetron (ZOFRAN) tablet 4 mg (has no administration in time range)    Or  ondansetron (ZOFRAN) injection 4 mg (has no administration in time range)  rosuvastatin (CRESTOR) tablet 20 mg (has no administration in time range)  levothyroxine (SYNTHROID) tablet 50 mcg (50 mcg Oral Given 06/23/23 0622)  pantoprazole (PROTONIX) EC tablet 40 mg (has no administration in time range)  apixaban (ELIQUIS) tablet 5 mg (5 mg Oral Given 06/23/23 0310)  feeding supplement (BOOST / RESOURCE BREEZE) liquid 1 Container (has no administration in time range)  ondansetron (ZOFRAN) injection 4 mg (4 mg Intravenous Given 06/22/23 1939)  sodium chloride 0.9 % bolus 1,000 mL (0 mLs Intravenous Stopped 06/22/23 2103)  pantoprazole (PROTONIX) injection 40 mg (40 mg Intravenous Given 06/22/23 1940)  iohexol (OMNIPAQUE) 300 MG/ML solution 100 mL (100 mLs Intravenous Contrast Given 06/22/23 2118)  potassium chloride SA (KLOR-CON M) CR tablet 40 mEq (40 mEq Oral Given 06/23/23 0156)    ED Course/ Medical Decision Making/ A&P Clinical Course as of 06/23/23 0844  Sun Jun 22, 2023  2235 Reviewed results of the patient and her husband.  She still rather weak has not been able to tolerate p.o. yet.  Feel she would be better served by being admitted to the hospital for continued workup possible infectious cause for her diarrhea and repletion of her electrolytes.  She is agreeable to admission. [MB]  2239 Discussed with [facility] hospitalist Dr. Thomes Dinning who  will evaluate patient for admission. [MB]    Clinical Course User Index [MB] Terrilee Files, MD                             Medical Decision Making Amount and/or Complexity of Data Reviewed Labs: ordered. Radiology: ordered.  Risk Prescription drug management. Decision regarding hospitalization.   This patient complains of general weakness diarrhea nausea vomiting abdominal pain; this involves an extensive number of treatment Options and is a complaint that carries with it a high risk of complications and morbidity. The differential includes dehydration, colitis, diverticulitis, infectious diarrhea, metabolic derangement  I ordered, reviewed and interpreted labs, which included CBC normal chemistries with significantly low potassium elevated creatinine, normal magnesium, stool studies sent, urinalysis equivocal for infection I ordered medication IV fluids nausea medicine PPI, IV potassium and reviewed PMP when indicated. I ordered imaging studies which included CT abdomen and pelvis and I independently    visualized and interpreted imaging which showed bowel thickening consistent with nonspecific colitis Additional history obtained from patient's husband Previous records obtained and reviewed in epic including recent oncology notes I consulted [facility] hospitalist Dr. Thomes Dinning and discussed lab and imaging findings and discussed disposition.  Cardiac monitoring reviewed, sinus bradycardia Social determinants considered, patient with depression Critical Interventions: None  After the interventions stated above, I reevaluated the patient and found patient to have had no further diarrhea since she has been here.  She still looks very weak and do not think she would do well at home. Admission and further testing considered, she would benefit from mission the hospital for continued hydration and electrolyte repletion and workup of her ongoing diarrhea and abdominal pain.  She is in  agreement with plan for admission.         Final Clinical Impression(s) / ED Diagnoses Final diagnoses:  Diarrhea, unspecified type  Lower  abdominal pain  Hypokalemia  General weakness  Non-small cell lung cancer, left [facility])    Rx / DC Orders ED Discharge Orders     None         Terrilee Files, MD 06/23/23 [id number]

## 2023-06-22 NOTE — H&P (Signed)
History and Physical    Patient: Patricia [name] OZH:[id number] DOB: 1946/06/09 DOA: 06/22/2023 DOS: the patient was seen and examined on 06/23/2023 PCP: [name], MD  Patient coming from: Home  Chief Complaint:  Chief Complaint  Patient presents with   Abdominal Pain   HPI: Patricia [name] is a 77 y.o. female with medical history significant of hyperlipidemia, GERD, non-small cell lung cancer (follows with Dr. Shirline Frees at [facility]), GERD, hypothyroidism, hyperlipidemia who presents to the emergency department due to 3-week onset of diarrhea which was thought to be due to [facility] that patient was taking, she was placed on Medrol dose pack without any improvement.  She complained of nausea, vomiting, poor oral intake and today, she complained of right lower abdominal pain.  She decided to come to the ED due to feeling dehydrated.  Patient was seen for diarrhea, N/V, nausea about 10 days ago.  ED Course:  In the emergency department, BP was 122/93, other vital signs were within normal range.  Workup in the ED showed normal CBC.  BMP showed sodium 133, potassium 2.5, chloride 93, bicarb 29, blood glucose 170, BUN 16, creatinine 1.18.  Lipase 26, urinalysis was positive for moderate hematuria, large leukocytes and many bacteria.  She denies any urinary bladder symptoms, magnesium 2.1 CT abdomen and pelvis with contrast showed findings compatible with nonspecific colitis from the splenic flexure to the level of the rectum.  No bowel obstruction or free air. Patient was treated with Protonix, Zofran and IV hydration was provided.  Hospitalist was asked to admit patient for further evaluation and management.  Review of Systems: Review of systems as noted in the HPI. All other systems reviewed and are negative.   Past Medical History:  Diagnosis Date   Depression    GERD (gastroesophageal reflux disease)    Hypercholesterolemia    IFG (impaired fasting glucose)    Nonintractable episodic  headache 01/18/2023   Osteopenia    PONV (postoperative nausea and vomiting)    Past Surgical History:  Procedure Laterality Date   ABDOMINAL HYSTERECTOMY     CATARACT EXTRACTION W/PHACO  09/12/2011   Procedure: CATARACT EXTRACTION PHACO AND INTRAOCULAR LENS PLACEMENT (IOC);  Surgeon: Gemma Payor;  Location: [facility];  Service: Ophthalmology;  Laterality: Left;  CDE: 8.91   CATARACT EXTRACTION W/PHACO  11/18/2011   Procedure: CATARACT EXTRACTION PHACO AND INTRAOCULAR LENS PLACEMENT (IOC);  Surgeon: Gemma Payor;  Location: [facility];  Service: Ophthalmology;  Laterality: Right;  CDE:10.26   COLONOSCOPY  2007   Dr. Jena Gauss: internal hemorrhoids, single anal papilla poor prep.    COLONOSCOPY N/A 03/27/2017   Rourk: Normal exam.   ECTOPIC PREGNANCY SURGERY     removal of right tube   ESOPHAGOGASTRODUODENOSCOPY (EGD) WITH PROPOFOL N/A 09/10/2021   Procedure: ESOPHAGOGASTRODUODENOSCOPY (EGD) WITH PROPOFOL;  Surgeon: Corbin Ade, MD;  Location: [facility];  Service: Endoscopy;  Laterality: N/A;  8:15AM   MALONEY DILATION N/A 09/10/2021   Procedure: Elease Hashimoto DILATION;  Surgeon: Corbin Ade, MD;  Location: [facility];  Service: Endoscopy;  Laterality: N/A;   OVARIAN CYST REMOVAL     right   RECTOCELE REPAIR N/A 01/18/2014   Procedure: POSTERIOR REPAIR (RECTOCELE);  Surgeon: Tilda Burrow, MD;  Location: [facility];  Service: Gynecology;  Laterality: N/A;   VAGINAL HYSTERECTOMY N/A 01/18/2014   Procedure: HYSTERECTOMY VAGINAL;  Surgeon: Tilda Burrow, MD;  Location: [facility];  Service: Gynecology;  Laterality: N/A;    Social History:  reports  that she quit smoking about 38 years ago. Her smoking use included cigarettes. She started smoking about 58 years ago. She has a 20 pack-year smoking history. She has never used smokeless tobacco. She reports that she does not drink alcohol and does not use drugs.   Allergies  Allergen Reactions   Codeine Nausea Only    Family History  Problem  Relation Age of Onset   Heart attack Father    Osteoporosis Mother    Heart attack Brother 28       MI   Breast cancer Maternal Aunt    Breast cancer Paternal Aunt    Cancer Paternal Aunt        ovarian cancer   Anesthesia problems Neg Hx    Hypotension Neg Hx    Malignant hyperthermia Neg Hx    Pseudochol deficiency Neg Hx    Colon cancer Neg Hx      Prior to Admission medications   Medication Sig Start Date End Date Taking? Authorizing Provider  ALPRAZolam Prudy Feeler) 0.5 MG tablet Take 1 tablet (0.5 mg total) by mouth at bedtime as needed for anxiety or sleep. TAKE (1) TABLET BY MOUTH AT BEDTIME AS NEEDED FOR SLEEP. 02/12/23   [name], MD  apixaban (ELIQUIS) 5 MG TABS tablet Take 1 tablet (5 mg total) by mouth 2 (two) times daily. 02/12/23   [name], MD  benzonatate (TESSALON) 100 MG capsule TAKE 1 CAPSULE (100 MG TOTAL) BY MOUTH THREE TIMES DAILY. 04/27/23   Heilingoetter, Cassandra L, PA-C  butalbital-acetaminophen-caffeine (FIORICET) 50-325-40 MG tablet TAKE 1/2 TABLET BY MOUTH EVERY 6 (SIX) HOURS AS NEEDED FOR HEADACHE. 04/21/23   [name], MD  cyclobenzaprine (FLEXERIL) 5 MG tablet Take 1 tablet (5 mg total) by mouth 3 (three) times daily as needed for muscle spasms (neck/shoulder pain). 02/04/23   Love, Evlyn Kanner, PA-C  docusate sodium (COLACE) 100 MG capsule Take 1 capsule (100 mg total) by mouth 2 (two) times daily. 02/04/23   Love, Evlyn Kanner, PA-C  famotidine (PEPCID) 20 MG tablet Take 1 tablet (20 mg total) by mouth 2 (two) times daily. 02/12/23   [name], MD  hydrochlorothiazide (HYDRODIURIL) 25 MG tablet Take 1 tablet (25 mg total) by mouth daily. 02/12/23   Luking, Jonna Coup, MD  KLOR-CON M20 20 MEQ tablet TAKE 1 TABLET BY MOUTH 3 TIMES DAILY. 03/31/23   [name], MD  lansoprazole (PREVACID) 30 MG capsule TAKE ONE CAPSULE BY MOUTH DAILY FOR ACID REFLUX 06/03/23   [name], MD  levothyroxine (SYNTHROID) 50 MCG tablet TAKE 1 TABLET BY MOUTH DAILY  BEFORE BREAKFAST 06/06/23   Si Gaul, MD  Menthol-Methyl Salicylate (MUSCLE RUB) 10-15 % CREA Apply 1 Application topically 3 (three) times daily to bilateral calves 02/04/23   Love, Evlyn Kanner, PA-C  methylPREDNISolone (MEDROL DOSEPAK) 4 MG TBPK tablet Use as instructed 06/11/23   Si Gaul, MD  Multiple Vitamin (MULTIVITAMIN WITH MINERALS) TABS tablet Take 1 tablet by mouth daily.    [provider]  ondansetron (ZOFRAN) 8 MG tablet Take 1 tablet (8 mg total) by mouth every 8 (eight) hours as needed for nausea or vomiting. 02/11/23   Heilingoetter, Cassandra L, PA-C  PARoxetine (PAXIL) 40 MG tablet TAKE 1 TABLET BY MOUTH EVERY DAY IN THE MORNING 04/30/23   [name], MD  prochlorperazine (COMPAZINE) 10 MG tablet Take 1 tablet (10 mg total) by mouth every 6 (six) hours as needed for nausea or vomiting.  02/04/23   Love, Evlyn Kanner, PA-C  rosuvastatin (CRESTOR) 20 MG tablet Take 1 tablet (20 mg total) by mouth daily. 02/12/23   [name], MD  senna-docusate (SENOKOT-S) 8.6-50 MG tablet Take 3 tablets by mouth 2 (two) times daily. 02/04/23   Love, Evlyn Kanner, PA-C  topiramate (TOPAMAX) 100 MG tablet Take 1 tablet (100 mg total) by mouth 2 (two) times daily. 02/12/23   [name], MD    Physical Exam: BP (!) 144/71 (BP Location: Right Arm)   Pulse (!) 56   Temp 98.9 F (37.2 C) (Oral)   Resp 16   Ht 4\' 10"  (1.473 m)   Wt 58.4 kg   SpO2 97%   BMI 26.91 kg/m   General: 77 y.o. year-old female well developed well nourished in no acute distress.  Alert and oriented x3. HEENT: NCAT, EOMI Neck: Supple, trachea medial Cardiovascular: Regular rate and rhythm with no rubs or gallops.  No thyromegaly or JVD noted.  No lower extremity edema. 2/4 pulses in all 4 extremities. Respiratory: Clear to auscultation with no wheezes or rales. Good inspiratory effort. Abdomen: Soft, tender to palpation of right lower quadrant without guarding.  Muskuloskeletal: No cyanosis, clubbing or  edema noted bilaterally Neuro: CN II-XII intact, sensation, reflexes intact Skin: No ulcerative lesions noted or rashes Psychiatry: Judgement and insight appear normal. Mood is appropriate for condition and setting          Labs on Admission:  Basic Metabolic Panel: Recent Labs  Lab 06/22/23 1940  NA 133*  K 2.5*  CL 93*  CO2 29  GLUCOSE 117*  BUN 16  CREATININE 1.18*  CALCIUM 9.4  MG 2.1   Liver Function Tests: Recent Labs  Lab 06/22/23 1940  AST 25  ALT 24  ALKPHOS 101  BILITOT 0.6  PROT 7.0  ALBUMIN 3.8   Recent Labs  Lab 06/22/23 1940  LIPASE 26   No results for input(s): "AMMONIA" in the last 168 hours. CBC: Recent Labs  Lab 06/22/23 1940  WBC 5.6  HGB 14.4  HCT 43.8  MCV 91.3  PLT 216   Cardiac Enzymes: No results for input(s): "CKTOTAL", "CKMB", "CKMBINDEX", "TROPONINI" in the last 168 hours.  BNP (last 3 results) No results for input(s): "BNP" in the last 8760 hours.  ProBNP (last 3 results) No results for input(s): "PROBNP" in the last 8760 hours.  CBG: No results for input(s): "GLUCAP" in the last 168 hours.  Radiological Exams on Admission: CT ABDOMEN PELVIS W CONTRAST  Result Date: 06/22/2023 CLINICAL DATA:  Right lower quadrant abdominal pain. History of non-small cell lung cancer. EXAM: CT ABDOMEN AND PELVIS WITH CONTRAST TECHNIQUE: Multidetector CT imaging of the abdomen and pelvis was performed using the standard protocol following bolus administration of intravenous contrast. RADIATION DOSE REDUCTION: This exam was performed according to the departmental dose-optimization program which includes automated exposure control, adjustment of the mA and/or kV according to patient size and/or use of iterative reconstruction technique. CONTRAST:  OMNIPAQUE IOHEXOL 300 MG/ML  SOLN COMPARISON:  CT abdomen and pelvis 05/30/2023.  PET-CT 12/06/2022 FINDINGS: Lower chest: Left lower lobe pulmonary nodule measuring 1 cm appears unchanged.  Hepatobiliary: No focal liver abnormality is seen. No gallstones, gallbladder wall thickening, or biliary dilatation. Pancreas: Unremarkable. No pancreatic ductal dilatation or surrounding inflammatory changes. Spleen: Normal in size without focal abnormality. Adrenals/Urinary Tract: Adrenal glands are unremarkable. Kidneys are normal, without renal calculi, focal lesion, or hydronephrosis. Small left bladder diverticulum is again seen.  The bladder is otherwise within normal limits. Stomach/Bowel: Large hiatal is again seen containing the proximal stomach. Stomach is nondilated. Small bowel loops are within normal limits. There is wall thickening of the splenic flexure to the level the rectum without surrounding inflammation. The appendix is within normal limits. There is no pneumatosis, bowel obstruction or free air. Vascular/Lymphatic: Aortic atherosclerosis. No enlarged abdominal or pelvic lymph nodes. Reproductive: Status post hysterectomy. No adnexal masses. Other: No abdominal wall hernia or abnormality. No abdominopelvic ascites. Musculoskeletal: Degenerative changes affect the spine. IMPRESSION: 1. Findings compatible with nonspecific colitis from the splenic flexure to the level of the rectum. No bowel obstruction or free air. 2. Stable large hiatal hernia. 3. Stable 1 cm left lower lobe pulmonary nodule. Aortic Atherosclerosis (ICD10-I70.0). Electronically Signed   By: Darliss Cheney M.D.   On: 06/22/2023 21:46    EKG: I independently viewed the EKG done and my findings are as followed: Normal sinus rhythm at a rate of 64 bpm with prolonged QTc of 529 ms  Assessment/Plan Present on Admission:  Acute colitis  Mixed hyperlipidemia  GERD without esophagitis  Principal Problem:   Acute colitis Active Problems:   Mixed hyperlipidemia   GERD without esophagitis   Hypokalemia   Dehydration   Prolonged QT interval   Acquired hypothyroidism   History of stroke  Acute colitis Continue IV NS at  75 mLs/Hr Continue IV Dilaudid 0.5 mg q.4h p.r.n. for moderate to severe pain Continue IV Compazine p.r.n. Continue clear liquid diet with plan to advanced diet as tolerated C. difficile and GI stool panel pending  Hypokalemia K+ 2.5, this was replenished  Dehydration Continue IV hydration  Prolonged QT interval QTc 529 ms Avoid QT prolonging drugs Magnesium 2.1 K+ 2.5, this was replenished Repeat EKG in the morning  Mixed hyperlipidemia Continue Crestor  GERD Continue Protonix  Acquired hypothyroidism Continue Synthroid History of stroke Continue Eliquis, Crestor  DVT prophylaxis: Eliquis   Advance Care Planning: DNR  Consults: None  Family Communication: None at bedside  Severity of Illness: The appropriate patient status for this patient is INPATIENT. Inpatient status is judged to be reasonable and necessary in order to provide the required intensity of service to ensure the patient's safety. The patient's presenting symptoms, physical exam findings, and initial radiographic and laboratory data in the context of their chronic comorbidities is felt to place them at high risk for further clinical deterioration. Furthermore, it is not anticipated that the patient will be medically stable for discharge from the hospital within 2 midnights of admission.   * I certify that at the point of admission it is my clinical judgment that the patient will require inpatient hospital care spanning beyond 2 midnights from the point of admission due to high intensity of service, high risk for further deterioration and high frequency of surveillance required.*  Author: Frankey Shown, DO 06/23/2023 1:30 AM  For on call review [email or web address].

## 2023-06-22 NOTE — ED Notes (Signed)
Provider at bedside

## 2023-06-22 NOTE — ED Notes (Signed)
Pt taken to the bathroom via wheelchair and 1 man assist. Pt provided a urine sample at this tie. Pt assisted back to bed where pt is resting peacefully. Will continue care

## 2023-06-22 NOTE — ED Triage Notes (Signed)
Pt to ed from home via [facility]. Pt c/o of chronic nausea/diarrhea. Pt states when she eats, "I have to fly to the bathroom and it goes right through me". Pt currently endorses RLQ pain that started 1 hours ago. Pt denies any urinary s/sx. Pt's VS WDL. Per ems cbg 130.

## 2023-06-22 NOTE — ED Notes (Signed)
Pt taken off of bedpan. Pt only urinated. Pt has not had a bm since pt has been in the ed. Pt given new brief

## 2023-06-22 NOTE — ED Notes (Signed)
ED Provider at bedside. 

## 2023-06-22 NOTE — ED Notes (Signed)
Pt placed on bedpan

## 2023-06-23 ENCOUNTER — Encounter (HOSPITAL_COMMUNITY): Payer: Self-pay | Admitting: Internal Medicine

## 2023-06-23 DIAGNOSIS — E039 Hypothyroidism, unspecified: Secondary | ICD-10-CM | POA: Insufficient documentation

## 2023-06-23 DIAGNOSIS — R1030 Lower abdominal pain, unspecified: Secondary | ICD-10-CM

## 2023-06-23 DIAGNOSIS — E876 Hypokalemia: Secondary | ICD-10-CM

## 2023-06-23 DIAGNOSIS — Z8673 Personal history of transient ischemic attack (TIA), and cerebral infarction without residual deficits: Secondary | ICD-10-CM

## 2023-06-23 DIAGNOSIS — K529 Noninfective gastroenteritis and colitis, unspecified: Secondary | ICD-10-CM | POA: Diagnosis not present

## 2023-06-23 DIAGNOSIS — E860 Dehydration: Secondary | ICD-10-CM | POA: Insufficient documentation

## 2023-06-23 DIAGNOSIS — R9431 Abnormal electrocardiogram [ECG] [EKG]: Secondary | ICD-10-CM | POA: Insufficient documentation

## 2023-06-23 HISTORY — DX: Hypokalemia: E87.6

## 2023-06-23 LAB — CBC
HCT: 35.9 % — ABNORMAL LOW (ref 36.0–46.0)
Hemoglobin: 11.7 g/dL — ABNORMAL LOW (ref 12.0–15.0)
MCH: 29.9 pg (ref 26.0–34.0)
MCHC: 32.6 g/dL (ref 30.0–36.0)
MCV: 91.8 fL (ref 80.0–100.0)
Platelets: 199 10*3/uL (ref 150–400)
RBC: 3.91 MIL/uL (ref 3.87–5.11)
RDW: 13.2 % (ref 11.5–15.5)
WBC: 5.3 10*3/uL (ref 4.0–10.5)
nRBC: 0 % (ref 0.0–0.2)

## 2023-06-23 LAB — C DIFFICILE QUICK SCREEN W PCR REFLEX
C Diff antigen: NEGATIVE
C Diff interpretation: NOT DETECTED
C Diff toxin: NEGATIVE

## 2023-06-23 LAB — COMPREHENSIVE METABOLIC PANEL
ALT: 22 U/L (ref 0–44)
AST: 23 U/L (ref 15–41)
Albumin: 2.9 g/dL — ABNORMAL LOW (ref 3.5–5.0)
Alkaline Phosphatase: 75 U/L (ref 38–126)
Anion gap: 7 (ref 5–15)
BUN: 12 mg/dL (ref 8–23)
CO2: 25 mmol/L (ref 22–32)
Calcium: 8.2 mg/dL — ABNORMAL LOW (ref 8.9–10.3)
Chloride: 102 mmol/L (ref 98–111)
Creatinine, Ser: 1.02 mg/dL — ABNORMAL HIGH (ref 0.44–1.00)
GFR, Estimated: 57 mL/min — ABNORMAL LOW (ref >60)
Glucose, Bld: 112 mg/dL — ABNORMAL HIGH (ref 70–99)
Potassium: 3.7 mmol/L (ref 3.5–5.1)
Sodium: 134 mmol/L — ABNORMAL LOW (ref 135–145)
Total Bilirubin: 0.4 mg/dL (ref 0.3–1.2)
Total Protein: 5.3 g/dL — ABNORMAL LOW (ref 6.5–8.1)

## 2023-06-23 LAB — PHOSPHORUS: Phosphorus: 2.5 mg/dL (ref 2.5–4.6)

## 2023-06-23 LAB — LACTIC ACID, PLASMA
Lactic Acid, Venous: 2.3 mmol/L (ref 0.5–1.9)
Lactic Acid, Venous: 1.3 mmol/L (ref 0.5–1.9)

## 2023-06-23 LAB — MAGNESIUM: Magnesium: 1.8 mg/dL (ref 1.7–2.4)

## 2023-06-23 MED ORDER — ACETAMINOPHEN 650 MG RE SUPP: 650.0000 mg | Freq: Four times a day (QID) | RECTAL | Status: DC | PRN

## 2023-06-23 MED ORDER — POTASSIUM CHLORIDE CRYS ER 20 MEQ PO TBCR
40.0000 meq | EXTENDED_RELEASE_TABLET | Freq: Once | ORAL | Status: AC
  Administered 2023-06-23: 40 meq via ORAL
  Filled 2023-06-23: qty 2

## 2023-06-23 MED ORDER — HYDROMORPHONE HCL 1 MG/ML IJ SOLN
0.5000 mg | INTRAMUSCULAR | Status: DC | PRN
  Administered 2023-06-23: 0.5 mg via INTRAVENOUS
  Filled 2023-06-23: qty 0.5

## 2023-06-23 MED ORDER — APIXABAN 5 MG PO TABS
5.0000 mg | ORAL_TABLET | Freq: Two times a day (BID) | ORAL | Status: DC
  Administered 2023-06-23 (×2): 5 mg via ORAL
  Filled 2023-06-23 (×2): qty 1

## 2023-06-23 MED ORDER — POTASSIUM CHLORIDE IN NACL 20-0.45 MEQ/L-% IV SOLN: INTRAVENOUS | Status: DC

## 2023-06-23 MED ORDER — ONDANSETRON HCL 4 MG/2ML IJ SOLN
4.0000 mg | Freq: Four times a day (QID) | INTRAMUSCULAR | Status: DC | PRN
  Administered 2023-06-23: 4 mg via INTRAVENOUS
  Filled 2023-06-23: qty 2

## 2023-06-23 MED ORDER — ONDANSETRON HCL 4 MG PO TABS: 4.0000 mg | ORAL_TABLET | Freq: Four times a day (QID) | ORAL | Status: DC | PRN

## 2023-06-23 MED ORDER — ROSUVASTATIN CALCIUM 20 MG PO TABS
20.0000 mg | ORAL_TABLET | Freq: Every day | ORAL | Status: DC
  Administered 2023-06-23 – 2023-06-26 (×3): 20 mg via ORAL
  Filled 2023-06-23 (×4): qty 1

## 2023-06-23 MED ORDER — SODIUM CHLORIDE 0.9 % IV SOLN: INTRAVENOUS | Status: DC

## 2023-06-23 MED ORDER — BOOST / RESOURCE BREEZE PO LIQD CUSTOM
1.0000 | Freq: Three times a day (TID) | ORAL | Status: DC
  Administered 2023-06-23 – 2023-06-25 (×4): 1 via ORAL

## 2023-06-23 MED ORDER — PANTOPRAZOLE SODIUM 40 MG PO TBEC
40.0000 mg | DELAYED_RELEASE_TABLET | Freq: Every day | ORAL | Status: DC
  Administered 2023-06-23 – 2023-06-26 (×3): 40 mg via ORAL
  Filled 2023-06-23 (×4): qty 1

## 2023-06-23 MED ORDER — LEVOTHYROXINE SODIUM 50 MCG PO TABS
50.0000 ug | ORAL_TABLET | Freq: Every day | ORAL | Status: DC
  Administered 2023-06-23 – 2023-06-26 (×4): 50 ug via ORAL
  Filled 2023-06-23 (×4): qty 1

## 2023-06-23 MED ORDER — ACETAMINOPHEN 325 MG PO TABS
650.0000 mg | ORAL_TABLET | Freq: Four times a day (QID) | ORAL | Status: DC | PRN
  Administered 2023-06-23 – 2023-06-25 (×3): 650 mg via ORAL
  Filled 2023-06-23 (×5): qty 2

## 2023-06-23 MED ORDER — PROCHLORPERAZINE EDISYLATE 10 MG/2ML IJ SOLN
10.0000 mg | Freq: Four times a day (QID) | INTRAMUSCULAR | Status: DC | PRN
  Administered 2023-06-24: 10 mg via INTRAVENOUS
  Filled 2023-06-23: qty 2

## 2023-06-23 NOTE — TOC Initial Note (Signed)
Transition of Care [facility]) - Initial/Assessment Note    Patient Details  Name: Patricia [name] MRN: [id number] Date of Birth: October 22, 1946  [facility]) CM/SW Contact:    Karn Cassis, LCSW Phone Number: 06/23/2023, 9:28 AM  Clinical Narrative: Pt admitted for acute colitis. Assessment completed due to high risk readmission score. Pt defers to husband. Pt's husband reports he assists pt with ADLs. No home health services prior to admission. Husband is not interested in home health at this time. Pt's granddaughter typically takes pt to appointments. No needs reported at this time. TOC will continue to follow.                   Expected Discharge Plan: Home/Self Care Barriers to Discharge: Continued Medical Work up   Patient Goals and CMS Choice Patient states their goals for this hospitalization and ongoing recovery are:: return home   Choice offered to / list presented to : Spouse [place] ownership interest in [facility].provided to::  (n/a)    Expected Discharge Plan and Services In-house Referral: Clinical Social Work     Living arrangements for the past 2 months: Single Family Home                                      Prior Living Arrangements/Services Living arrangements for the past 2 months: Single Family Home Lives with:: Spouse Patient language and need for interpreter reviewed:: Yes Do you feel safe going back to the place where you live?: Yes      Need for Family Participation in Patient Care: Yes (Comment) Care giver support system in place?: Yes (comment) Current home services: DME (walker, wheelchair) Criminal Activity/Legal Involvement Pertinent to Current Situation/Hospitalization: No - Comment as needed  Activities of Daily Living Home Assistive Devices/Equipment: Dentures (specify type), Scales, Shower chair with back, Walker (specify type), Wheelchair ADL Screening (condition at time of admission) Patient's  cognitive ability adequate to safely complete daily activities?: Yes Is the patient deaf or have difficulty hearing?: No Does the patient have difficulty seeing, even when wearing glasses/contacts?: No Does the patient have difficulty concentrating, remembering, or making decisions?: No Patient able to express need for assistance with ADLs?: Yes Does the patient have difficulty dressing or bathing?: No Independently performs ADLs?: No Communication: Independent Dressing (OT): Independent Grooming: Independent Feeding: Independent Bathing: Needs assistance Is this a change from baseline?: Pre-admission baseline Toileting: Needs assistance Is this a change from baseline?: Pre-admission baseline (gait belt, husband helps per patient) In/Out Bed: Needs assistance Is this a change from baseline?: Pre-admission baseline Walks in Home: Needs assistance Is this a change from baseline?: Pre-admission baseline Weakness of Legs: Both Weakness of Arms/Hands: None  Permission Sought/Granted                  Emotional Assessment     Affect (typically observed): Appropriate Orientation: : Oriented to Self, Oriented to Place, Oriented to  Time, Oriented to Situation Alcohol / Substance Use: Not Applicable Psych Involvement: No (comment)  Admission diagnosis:  Acute colitis [K52.9] Patient Active Problem List   Diagnosis Date Noted   Hypokalemia 06/23/2023   Dehydration 06/23/2023   Prolonged QT interval 06/23/2023   Acquired hypothyroidism 06/23/2023   History of stroke 06/23/2023   Acute colitis 06/22/2023   Weight loss 06/03/2023   Paroxysmal cough 02/03/2023   DVT of lower extremity, bilateral (HCC) 02/03/2023  Pain of left lower extremity 01/18/2023   Embolic stroke (HCC) 01/17/2023   Acute CVA (cerebrovascular accident) (HCC) 01/11/2023   Encounter for antineoplastic chemotherapy 12/30/2022   Adenocarcinoma of left lung, stage 4 (HCC) 12/19/2022   Left lower lobe  pulmonary nodule 11/22/2022   Lymphadenopathy of head and neck region 11/22/2022   Hoarseness 09/07/2022   Primary hypertension 08/30/2022   Hiatal hernia 08/05/2022   Persistent depressive disorder, moderate 08/03/2022   Dysphagia 07/13/2021   Anxiety and depression 07/23/2017   Encounter for antineoplastic immunotherapy 02/19/2017   Prolapse of vaginal vault 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 [place] [address] Phone: [phone] Fax: [phone]     Social Determinants of Health (SDOH) Social History: SDOH Screenings   Food Insecurity: No Food Insecurity (06/23/2023)  Housing: Low Risk  (06/23/2023)  Transportation Needs: No Transportation Needs (06/23/2023)  Utilities: Not At Risk (06/23/2023)  Depression (PHQ2-9): High Risk (02/12/2023)  Social Connections: Unknown (04/20/2022)   Received from [facility]  Tobacco Use: Medium Risk (06/22/2023)   SDOH Interventions:     Readmission Risk Interventions    06/23/2023    9:18 AM  Readmission Risk Prevention Plan  Transportation Screening Complete  Medication Review (RN Care Manager) Complete  HRI or Home Care Consult Complete  SW Recovery Care/Counseling Consult Complete  Palliative Care Screening Not Applicable  Skilled Nursing Facility Not Applicable

## 2023-06-23 NOTE — Plan of Care (Signed)
  Problem: Education: Goal: Knowledge of General Education information will improve Description: Including pain rating scale, medication(s)/side effects and non-pharmacologic comfort measures Outcome: Progressing   Problem: Health Behavior/Discharge Planning: Goal: Ability to manage health-related needs will improve Outcome: Progressing   Problem: Clinical Measurements: Goal: Ability to maintain clinical measurements within normal limits will improve Outcome: Progressing Goal: Diagnostic test results will improve Outcome: Progressing Goal: Respiratory complications will improve Outcome: Progressing Goal: Cardiovascular complication will be avoided Outcome: Progressing   Problem: Activity: Goal: Risk for activity intolerance will decrease Outcome: Progressing   Problem: Coping: Goal: Level of anxiety will decrease Outcome: Progressing   Problem: Elimination: Goal: Will not experience complications related to urinary retention Outcome: Progressing   Problem: Pain Managment: Goal: General experience of comfort will improve Outcome: Progressing   Problem: Safety: Goal: Ability to remain free from injury will improve Outcome: Progressing   Problem: Skin Integrity: Goal: Risk for impaired skin integrity will decrease Outcome: Progressing   Problem: Education: Goal: Knowledge of General Education information will improve Description: Including pain rating scale, medication(s)/side effects and non-pharmacologic comfort measures Outcome: Progressing   Problem: Health Behavior/Discharge Planning: Goal: Ability to manage health-related needs will improve Outcome: Progressing   Problem: Clinical Measurements: Goal: Ability to maintain clinical measurements within normal limits will improve Outcome: Progressing Goal: Will remain free from infection Outcome: Progressing   Problem: Clinical Measurements: Goal: Will remain free from infection Outcome: Not Progressing    Problem: Nutrition: Goal: Adequate nutrition will be maintained Outcome: Not Progressing   Problem: Elimination: Goal: Will not experience complications related to bowel motility Outcome: Not Progressing

## 2023-06-23 NOTE — Consult Note (Signed)
@LOGO @   Referring Provider:Grunz, Alycia Rossetti, MD Primary Care Physician:  [name], MD Primary Gastroenterologist:  Dr. Jena Gauss  Date of Admission: 06/22/23 Date of Consultation: 06/23/23  Reason for Consultation:  Colitis  HPI:  Patricia [name] is a 77 y.o. year old female with medical history significant of hyperlipidemia, GERD, Stage IV (T1c, N3, M1 C)  non-small cell lung cancer (follows with Dr. Shirline Frees at [facility]), stroke on Eliquis, GERD, hypothyroidism, hyperlipidemia who presents to the emergency department due to 3-week onset of diarrhea with associated nausea, vomiting, poor intake, and now with RLQ abdominal pain.    Per chart review, patient reported increase diarrhea at her OV with oncology 06/03/2023.  She was advised to take Imodium.  Imodium was not helpful and she was prescribed Medrol Dosepak 6/27.  She was seen in the emergency room 06/12/2023 reporting 2 to 3 weeks of persistent diarrhea since her last infusion, not improved with Imodium or Medrol Dosepak.  She was treated supportively and advised to follow-up with oncology for management of diarrhea.  ED course:  BP was 122/93, other vital signs were within normal range. Workup in the ED showed normal CBC. BMP showed sodium 133, potassium 2.5, chloride 93, bicarb 29, blood glucose 170, BUN 16, creatinine 1.18. Lipase 26, magnesium 2.1, urinalysis was positive for moderate hematuria, large leukocytes and many bacteria.  CT abdomen and pelvis with contrast showed findings compatible with nonspecific colitis from the splenic flexure to the level of the rectum. No bowel obstruction or free air.   Hospitalist admitted patient, start her on IV fluids, Dilaudid for pain management, Compazine, liquid diet, and stool studies were ordered.  GI consulted this morning for additional help with colitis.  Today: Reports persistent diarrhea for the last month. States everything she eats runs right through her. Can have 10 or more water Bms  in 24 hours. Has nocturnal stools. Associated RLQ abdominal cramping prior to a BM that improves thereafter. No brbpr or melena. Also with nausea and vomiting since this started. She has lost weight due to these symptoms. Denies similar symptoms in the past. Prior to this Bms were "normal".   Denies recent antibiotics or travel.  She does drink well water.  No sick contacts.  Starte levothyroxine about 1 month ago.  No Fhx of IBD or colon cancer.    Colonoscopy 03/2017: normal. Due to age, no future colonoscopies unless new symptoms.   Past Medical History:  Diagnosis Date   Depression    GERD (gastroesophageal reflux disease)    Hypercholesterolemia    IFG (impaired fasting glucose)    Nonintractable episodic headache 01/18/2023   Osteopenia    PONV (postoperative nausea and vomiting)    Stroke [facility])     Past Surgical History:  Procedure Laterality Date   ABDOMINAL HYSTERECTOMY     CATARACT EXTRACTION W/PHACO  09/12/2011   Procedure: CATARACT EXTRACTION PHACO AND INTRAOCULAR LENS PLACEMENT (IOC);  Surgeon: Gemma Payor;  Location: [facility] OR[facility];  Service: Ophthalmology;  Laterality: Left;  CDE: 8.91   CATARACT EXTRACTION W/PHACO  11/18/2011   Procedure: CATARACT EXTRACTION PHACO AND INTRAOCULAR LENS PLACEMENT (IOC);  Surgeon: Gemma Payor;  Location: [facility];  Service: Ophthalmology;  Laterality: Right;  CDE:10.26   COLONOSCOPY  2007   Dr. Jena Gauss: internal hemorrhoids, single anal papilla poor prep.    COLONOSCOPY N/A 03/27/2017   Rourk: Normal exam.   ECTOPIC PREGNANCY SURGERY     removal of right tube   ESOPHAGOGASTRODUODENOSCOPY (EGD) WITH PROPOFOL N/A  09/10/2021   Procedure: ESOPHAGOGASTRODUODENOSCOPY (EGD) WITH PROPOFOL;  Surgeon: Corbin Ade, MD;  Location: [facility];  Service: Endoscopy;  Laterality: N/A;  8:15AM   MALONEY DILATION N/A 09/10/2021   Procedure: Elease Hashimoto DILATION;  Surgeon: Corbin Ade, MD;  Location: [facility];  Service: Endoscopy;  Laterality: N/A;    OVARIAN CYST REMOVAL     right   RECTOCELE REPAIR N/A 01/18/2014   Procedure: POSTERIOR REPAIR (RECTOCELE);  Surgeon: Tilda Burrow, MD;  Location: [facility];  Service: Gynecology;  Laterality: N/A;   VAGINAL HYSTERECTOMY N/A 01/18/2014   Procedure: HYSTERECTOMY VAGINAL;  Surgeon: Tilda Burrow, MD;  Location: [facility];  Service: Gynecology;  Laterality: N/A;    Prior to Admission medications   Medication Sig Start Date End Date Taking? Authorizing Provider  ALPRAZolam Prudy Feeler) 0.5 MG tablet Take 1 tablet (0.5 mg total) by mouth at bedtime as needed for anxiety or sleep. TAKE (1) TABLET BY MOUTH AT BEDTIME AS NEEDED FOR SLEEP. 02/12/23  Yes Luking, Jonna Coup, MD  apixaban (ELIQUIS) 5 MG TABS tablet Take 1 tablet (5 mg total) by mouth 2 (two) times daily. 02/12/23  Yes Luking, Scott A, MD  benzonatate (TESSALON) 100 MG capsule TAKE 1 CAPSULE (100 MG TOTAL) BY MOUTH THREE TIMES DAILY. 04/27/23  Yes Heilingoetter, Cassandra L, PA-C  butalbital-acetaminophen-caffeine (FIORICET) 50-325-40 MG tablet TAKE 1/2 TABLET BY MOUTH EVERY 6 (SIX) HOURS AS NEEDED FOR HEADACHE. 04/21/23  Yes [name], MD  cyclobenzaprine (FLEXERIL) 5 MG tablet Take 1 tablet (5 mg total) by mouth 3 (three) times daily as needed for muscle spasms (neck/shoulder pain). 02/04/23  Yes Love, Evlyn Kanner, PA-C  docusate sodium (COLACE) 100 MG capsule Take 1 capsule (100 mg total) by mouth 2 (two) times daily. 02/04/23  Yes Love, Evlyn Kanner, PA-C  famotidine (PEPCID) 20 MG tablet Take 1 tablet (20 mg total) by mouth 2 (two) times daily. 02/12/23  Yes [name], MD  hydrochlorothiazide (HYDRODIURIL) 25 MG tablet Take 1 tablet (25 mg total) by mouth daily. 02/12/23  Yes Luking, Jonna Coup, MD  KLOR-CON M20 20 MEQ tablet TAKE 1 TABLET BY MOUTH 3 TIMES DAILY. 03/31/23  Yes [name], MD  lansoprazole (PREVACID) 30 MG capsule TAKE ONE CAPSULE BY MOUTH DAILY FOR ACID REFLUX 06/03/23  Yes [name], MD  levothyroxine (SYNTHROID) 50 MCG tablet  TAKE 1 TABLET BY MOUTH DAILY BEFORE BREAKFAST 06/06/23  Yes Si Gaul, MD  Menthol-Methyl Salicylate (MUSCLE RUB) 10-15 % CREA Apply 1 Application topically 3 (three) times daily to bilateral calves 02/04/23  Yes Love, Evlyn Kanner, PA-C  Multiple Vitamin (MULTIVITAMIN WITH MINERALS) TABS tablet Take 1 tablet by mouth daily.   Yes [provider]  ondansetron (ZOFRAN) 8 MG tablet Take 1 tablet (8 mg total) by mouth every 8 (eight) hours as needed for nausea or vomiting. 02/11/23  Yes Heilingoetter, Cassandra L, PA-C  PARoxetine (PAXIL) 40 MG tablet TAKE 1 TABLET BY MOUTH EVERY DAY IN THE MORNING 04/30/23  Yes Luking, Jonna Coup, MD  prochlorperazine (COMPAZINE) 10 MG tablet Take 1 tablet (10 mg total) by mouth every 6 (six) hours as needed for nausea or vomiting. 02/04/23  Yes Love, Evlyn Kanner, PA-C  rosuvastatin (CRESTOR) 20 MG tablet Take 1 tablet (20 mg total) by mouth daily. 02/12/23  Yes Luking, Jonna Coup, MD  senna-docusate (SENOKOT-S) 8.6-50 MG tablet Take 3 tablets by mouth 2 (two) times daily. 02/04/23  Yes Love, Evlyn Kanner, PA-C  topiramate (TOPAMAX) 100 MG tablet Take 1 tablet (100 mg total) by mouth 2 (two) times daily. 02/12/23  Yes [name], MD  methylPREDNISolone (MEDROL DOSEPAK) 4 MG TBPK tablet Use as instructed Patient not taking: Reported on 06/23/2023 06/11/23   Si Gaul, MD    Current Facility-Administered Medications  Medication Dose Route Frequency Provider Last Rate Last Admin   0.9 %  sodium chloride infusion   Intravenous Continuous Adefeso, Oladapo, DO 75 mL/hr at 06/23/23 0212 New Bag at 06/23/23 0212   acetaminophen (TYLENOL) tablet 650 mg  650 mg Oral Q6H PRN Adefeso, Oladapo, DO   650 mg at 06/23/23 1610   Or   acetaminophen (TYLENOL) suppository 650 mg  650 mg Rectal Q6H PRN Adefeso, Oladapo, DO       apixaban (ELIQUIS) tablet 5 mg  5 mg Oral BID Adefeso, Oladapo, DO   5 mg at 06/23/23 1000   feeding supplement (BOOST / RESOURCE BREEZE) liquid 1 Container  1  Container Oral TID BM Adefeso, Oladapo, DO   1 Container at 06/23/23 1349   HYDROmorphone (DILAUDID) injection 0.5 mg  0.5 mg Intravenous Q4H PRN Adefeso, Oladapo, DO       levothyroxine (SYNTHROID) tablet 50 mcg  50 mcg Oral Q0600 Adefeso, Oladapo, DO   50 mcg at 06/23/23 0622   ondansetron (ZOFRAN) tablet 4 mg  4 mg Oral Q6H PRN Frankey Shown, DO       Or   ondansetron (ZOFRAN) injection 4 mg  4 mg Intravenous Q6H PRN Adefeso, Oladapo, DO   4 mg at 06/23/23 1355   pantoprazole (PROTONIX) EC tablet 40 mg  40 mg Oral Daily Adefeso, Oladapo, DO   40 mg at 06/23/23 1000   prochlorperazine (COMPAZINE) injection 10 mg  10 mg Intravenous Q6H PRN Adefeso, Oladapo, DO       rosuvastatin (CRESTOR) tablet 20 mg  20 mg Oral Daily Adefeso, Oladapo, DO   20 mg at 06/23/23 1000    Allergies as of 06/22/2023 - Review Complete 06/22/2023  Allergen Reaction Noted   Codeine Nausea Only 09/11/2011    Family History  Problem Relation Age of Onset   Osteoporosis Mother    Heart attack Father    Heart attack Brother 15       MI   Breast cancer Maternal Aunt    Breast cancer Paternal Aunt    Cancer Paternal Aunt        ovarian cancer   Anesthesia problems Neg Hx    Hypotension Neg Hx    Malignant hyperthermia Neg Hx    Pseudochol deficiency Neg Hx    Colon cancer Neg Hx    Inflammatory bowel disease Neg Hx     Social History   Socioeconomic History   Marital status: Married    Spouse name: Not on file   Number of children: Not on file   Years of education: Not on file   Highest education level: Not on file  Occupational History   Not on file  Tobacco Use   Smoking status: Former    Current packs/day: 0.00    Average packs/day: 1 pack/day for 20.0 years (20.0 ttl pk-yrs)    Types: Cigarettes    Start date: 09/10/1964    Quit date: 09/10/1984    Years since quitting: 38.8   Smokeless tobacco: Never  Vaping Use   Vaping status: Never Used  Substance and Sexual Activity   Alcohol use:  No   Drug use: No  Sexual activity: Not Currently    Birth control/protection: Post-menopausal  Other Topics Concern   Not on file  Social History Narrative   Not on file   Social Determinants of Health   Financial Resource Strain: Not on file  Food Insecurity: No Food Insecurity (06/23/2023)   Hunger Vital Sign    Worried About Running Out of Food in the Last Year: Never true    Ran Out of Food in the Last Year: Never true  Transportation Needs: No Transportation Needs (06/23/2023)   PRAPARE - Administrator, Civil Service (Medical): No    Lack of Transportation (Non-Medical): No  Physical Activity: Not on file  Stress: Not on file  Social Connections: Unknown (04/20/2022)   Received from [facility]   Social Network    Social Network: Not on file  Intimate Partner Violence: Not At Risk (06/23/2023)   Humiliation, Afraid, Rape, and Kick questionnaire    Fear of Current or Ex-Partner: No    Emotionally Abused: No    Physically Abused: No    Sexually Abused: No    Review of Systems: Gen: Denies fever, chills, cold or flulike symptoms, presyncope, syncope. CV: Denies chest pain, heart palpitations. Resp: Denies shortness of breath, cough. GI: See HPI GU : Denies urinary burning, urinary frequency, urinary incontinence.  MS: Denies joint pain. Derm: Denies rash. Psych: Denies depression, anxiety. Heme: See HPI  Physical Exam: Vital signs in last 24 hours: Temp:  [98.3 F (36.8 C)-98.9 F (37.2 C)] 98.5 F (36.9 C) (07/15 1312) Pulse Rate:  [56-67] 65 (07/15 1312) Resp:  [14-20] 18 (07/15 1312) BP: (101-144)/(57-93) 117/60 (07/15 1312) SpO2:  [91 %-97 %] 95 % (07/15 1312) Weight:  [56.2 kg-58.4 kg] 58.4 kg (07/15 0043) Last BM Date : 06/23/23 General:   Alert,  Well-developed, well-nourished, appears ill, NAD.  Head:  Normocephalic and atraumatic. Eyes:  Sclera clear, no icterus.   Conjunctiva pink. Ears:  Normal auditory acuity. Lungs:  Clear  throughout to auscultation.   No wheezes, crackles, or rhonchi. No acute distress. Heart:  Regular rate and rhythm; no murmurs, clicks, rubs,  or gallops. Abdomen:  Soft and nondistended. Minimal TP in LLQ. No masses, hepatosplenomegaly or hernias noted. Normal bowel sounds, without guarding, and without rebound.   Rectal:  Deferred  Msk:  Symmetrical without gross deformities. Normal posture. Extremities:  Without edema. Neurologic:  Alert and  oriented x4;  grossly normal neurologically. Psych:  Normal mood and affect.  Intake/Output from previous day: 07/14 0701 - 07/15 0700 In: 1100 [IV Piggyback:1100] Out: 100 [Urine:100] Intake/Output this shift: Total I/O In: 340 [P.O.:340] Out: -   Lab Results: Recent Labs    06/22/23 1940 06/23/23 0517  WBC 5.6 5.3  HGB 14.4 11.7*  HCT 43.8 35.9*  PLT 216 199   BMET Recent Labs    06/22/23 1940 06/23/23 0517  NA 133* 134*  K 2.5* 3.7  CL 93* 102  CO2 29 25  GLUCOSE 117* 112*  BUN 16 12  CREATININE 1.18* 1.02*  CALCIUM 9.4 8.2*   LFT Recent Labs    06/22/23 1940 06/23/23 0517  PROT 7.0 5.3*  ALBUMIN 3.8 2.9*  AST 25 23  ALT 24 22  ALKPHOS 101 75  BILITOT 0.6 0.4    C-Diff Recent Labs    06/23/23 0215  CDIFFTOX NEGATIVE    Studies/Results: CT ABDOMEN PELVIS W CONTRAST  Result Date: 06/22/2023 CLINICAL DATA:  Right lower quadrant abdominal pain. History of non-small  cell lung cancer. EXAM: CT ABDOMEN AND PELVIS WITH CONTRAST TECHNIQUE: Multidetector CT imaging of the abdomen and pelvis was performed using the standard protocol following bolus administration of intravenous contrast. RADIATION DOSE REDUCTION: This exam was performed according to the departmental dose-optimization program which includes automated exposure control, adjustment of the mA and/or kV according to patient size and/or use of iterative reconstruction technique. CONTRAST:  OMNIPAQUE IOHEXOL 300 MG/ML  SOLN COMPARISON:  CT abdomen and  pelvis 05/30/2023.  PET-CT 12/06/2022 FINDINGS: Lower chest: Left lower lobe pulmonary nodule measuring 1 cm appears unchanged. Hepatobiliary: No focal liver abnormality is seen. No gallstones, gallbladder wall thickening, or biliary dilatation. Pancreas: Unremarkable. No pancreatic ductal dilatation or surrounding inflammatory changes. Spleen: Normal in size without focal abnormality. Adrenals/Urinary Tract: Adrenal glands are unremarkable. Kidneys are normal, without renal calculi, focal lesion, or hydronephrosis. Small left bladder diverticulum is again seen. The bladder is otherwise within normal limits. Stomach/Bowel: Large hiatal is again seen containing the proximal stomach. Stomach is nondilated. Small bowel loops are within normal limits. There is wall thickening of the splenic flexure to the level the rectum without surrounding inflammation. The appendix is within normal limits. There is no pneumatosis, bowel obstruction or free air. Vascular/Lymphatic: Aortic atherosclerosis. No enlarged abdominal or pelvic lymph nodes. Reproductive: Status post hysterectomy. No adnexal masses. Other: No abdominal wall hernia or abnormality. No abdominopelvic ascites. Musculoskeletal: Degenerative changes affect the spine. IMPRESSION: 1. Findings compatible with nonspecific colitis from the splenic flexure to the level of the rectum. No bowel obstruction or free air. 2. Stable large hiatal hernia. 3. Stable 1 cm left lower lobe pulmonary nodule. Aortic Atherosclerosis (ICD10-I70.0). Electronically Signed   By: Darliss Cheney M.D.   On: 06/22/2023 21:46    Impression: 77 y.o. year old female with medical history significant of hyperlipidemia, GERD, Stage IV (T1c, N3, M1 C)  non-small cell lung cancer (follows with Dr. Shirline Frees at [facility]), stroke on Eliquis, GERD, hypothyroidism, hyperlipidemia who presents to the emergency department due to 3-weeks of multiple watery Bms daily with associated nausea, vomiting, abdominal  pain, poor intake, found to have colitis on CT scan. GI consulted for further evaluation/management.   Colitis:  CT abdomen and pelvis with contrast showed findings compatible with nonspecific colitis from the splenic flexure to the level of the rectum. Reports at least 10 watery Bms/24 hours, nocturnal stools. Denies brbpr or melena. She has lost weight due to current symptoms.  No leukocytosis or fever. Denies recent antibiotics. She does drink well water. Started levothyroxine 1 month ago, otherwise, no new medications. Last colonoscopy in 2018 was normal.   Differentials include immune checkpoint inhibitor colitis in the setting of Keytruda though she had no improvement with Medrol Dosepak outpatient. Need to rule out infection. C diff was negative this admission. GI panel is pending. Less likely IBD. Thyroid abnormalities may also be contributing, but likely not the sole cause. I will update TSH. We will see what her GI panel shows. Due to severity of symptoms and persistence over the lat 3 weeks, will go ahead and arrange flex sig.     Plan: TSH Follow-up on GI pathogen panel.  Flex sig with propofol with Dr. Levon Hedger tomorrow.  Will hold evening dose and morning dose of Eliquis.  Continue supportive measures.    LOS: 1 day    06/23/2023, 2:27 PM   Ermalinda Memos, [facility]

## 2023-06-23 NOTE — Progress Notes (Signed)
[facility] PROGRESS NOTE  Patricia [name] (DOB: 01/09/1946) VOZ:[id number] PCP: [name], MD  Brief Narrative: Patricia [name] is a 77 y.o. female with a history of NSCLC, HLD, GERD, hypothyroidism who presented to the ED on 06/22/2023 with 3 weeks of diarrhea and lower abdominal discomfort associated with poor oral intake. She was hypokalemic (K 2.5), with BUN 16, Cr 1.18 (baseline 0.7). CT demonstrated wall thickening of the descending colon from splenic flexure to rectum. IV fluids and antiemetics given, and patient admitted for colitis. Lactic acid is mildly elevated, Hgb down 14 > 11.   Subjective: Abdominal cramping/soreness in RIGHT lower abdomen comes and goes, still having loose green stools that are not associated with pain. No bleeding per pt.   Objective: BP 117/60 (BP Location: Left Arm)   Pulse 65   Temp 98.5 F (36.9 C)   Resp 18   Ht 4\' 10"  (1.473 m)   Wt 58.4 kg   SpO2 95%   BMI 26.91 kg/m   Gen: No distress Pulm: Clear, nonlabored. Has soft/hoarse voice which she states is baseline.  CV: RRR, no MRG or edema GI: Soft, minimally tender in lower quadrants, ND, +BS Neuro: Alert and oriented. No new focal deficits. Ext: Warm, no deformities. Skin: No new rashes, lesions or ulcers on visualized skin   Assessment & Plan: Descending colitis: Exam reassuring. Lactic acid elevated though no acidosis, LA cleared on recheck.  - C. diff negative, GI pathogen panel pending.  - ?if infectious vs. due to [place] vs. ischemic (less likely).  - Given diagnostic uncertainty, would appreciate GI recommendations. Had screening colonoscopy that showed no significant findings in 2018 by Dr. Jena Gauss.   Normocytic anemia: No bleeding reported, though hgb 14.7 > 11.7g/dl despite relative stability in WBC and plt's.  - Monitor in AM. Unclear utility of FOBT at this time.   Hypokalemia:  - Supplement and monitor  NSCLC:  - Follow up with Dr. Arbutus Ped.  - Will plan not  to give keytruda at this time.   AKI: Prerenal most likely with dehydration by exam and history otherwise. Dirty catch urine did not show casts by micro.  - Continue IVF given her ongoing GI losses.   History of CVA: Presents concomitant risk factor for mesenteric atherosclerosis.  - Continue eliquis, statin  HLD:  - Continue statin  GERD:  - Continue PPI  Hypothyroidism:  - Continue synthroid  Tyrone Nine, MD [facility] [email or web address] 06/23/2023, 4:11 PM

## 2023-06-24 ENCOUNTER — Inpatient Hospital Stay: Payer: Medicare HMO

## 2023-06-24 ENCOUNTER — Encounter (HOSPITAL_COMMUNITY)
Admission: EM | Disposition: A | Discharge status: Home / Self Care | Payer: Self-pay | Source: Home / Self Care | Attending: Family Medicine

## 2023-06-24 ENCOUNTER — Inpatient Hospital Stay: Payer: Medicare HMO | Admitting: Internal Medicine

## 2023-06-24 ENCOUNTER — Inpatient Hospital Stay (HOSPITAL_COMMUNITY): Payer: Medicare HMO | Admitting: Anesthesiology

## 2023-06-24 ENCOUNTER — Encounter (HOSPITAL_COMMUNITY): Payer: Self-pay | Admitting: Internal Medicine

## 2023-06-24 DIAGNOSIS — K922 Gastrointestinal hemorrhage, unspecified: Secondary | ICD-10-CM

## 2023-06-24 DIAGNOSIS — R197 Diarrhea, unspecified: Secondary | ICD-10-CM | POA: Diagnosis not present

## 2023-06-24 DIAGNOSIS — Z87891 Personal history of nicotine dependence: Secondary | ICD-10-CM

## 2023-06-24 DIAGNOSIS — I10 Essential (primary) hypertension: Secondary | ICD-10-CM

## 2023-06-24 DIAGNOSIS — K529 Noninfective gastroenteritis and colitis, unspecified: Secondary | ICD-10-CM | POA: Diagnosis not present

## 2023-06-24 DIAGNOSIS — I679 Cerebrovascular disease, unspecified: Secondary | ICD-10-CM

## 2023-06-24 HISTORY — PX: COLONOSCOPY: SHX5424

## 2023-06-24 HISTORY — PX: BIOPSY: SHX5522

## 2023-06-24 LAB — BASIC METABOLIC PANEL
Anion gap: 6 (ref 5–15)
BUN: 7 mg/dL — ABNORMAL LOW (ref 8–23)
CO2: 25 mmol/L (ref 22–32)
Calcium: 8.3 mg/dL — ABNORMAL LOW (ref 8.9–10.3)
Chloride: 104 mmol/L (ref 98–111)
Creatinine, Ser: 0.93 mg/dL (ref 0.44–1.00)
GFR, Estimated: >60 mL/min (ref >60)
Glucose, Bld: 106 mg/dL — ABNORMAL HIGH (ref 70–99)
Potassium: 3.1 mmol/L — ABNORMAL LOW (ref 3.5–5.1)
Sodium: 135 mmol/L (ref 135–145)

## 2023-06-24 LAB — TSH: TSH: 22.448 u[IU]/mL — ABNORMAL HIGH (ref 0.350–4.500)

## 2023-06-24 SURGERY — BIOPSY
Anesthesia: General

## 2023-06-24 MED ORDER — LACTATED RINGERS IV SOLN: INTRAVENOUS | Status: DC

## 2023-06-24 MED ORDER — PROPOFOL 10 MG/ML IV BOLUS
INTRAVENOUS | Status: DC | PRN
Start: 2023-06-24 — End: 2023-06-24
  Administered 2023-06-24: 20 mg via INTRAVENOUS
  Administered 2023-06-24: 40 mg via INTRAVENOUS
  Administered 2023-06-24: 100 mg via INTRAVENOUS

## 2023-06-24 MED ORDER — ORAL CARE MOUTH RINSE: 15.0000 mL | OROMUCOSAL | Status: DC | PRN

## 2023-06-24 MED ORDER — KCL IN DEXTROSE-NACL 40-5-0.45 MEQ/L-%-% IV SOLN
INTRAVENOUS | Status: DC
  Filled 2023-06-24: qty 1000

## 2023-06-24 MED ORDER — SODIUM CHLORIDE 0.9 % IV SOLN: INTRAVENOUS | Status: DC

## 2023-06-24 MED ORDER — GUAIFENESIN-DM 100-10 MG/5ML PO SYRP
5.0000 mL | ORAL_SOLUTION | ORAL | Status: DC | PRN
  Administered 2023-06-24: 5 mL via ORAL
  Filled 2023-06-24: qty 5

## 2023-06-24 MED ORDER — SODIUM CHLORIDE 0.45 % IV SOLN: INTRAVENOUS | Status: DC

## 2023-06-24 MED ORDER — PHENYLEPHRINE 80 MCG/ML (10ML) SYRINGE FOR IV PUSH (FOR BLOOD PRESSURE SUPPORT)
PREFILLED_SYRINGE | INTRAVENOUS | Status: DC | PRN
  Administered 2023-06-24 (×3): 160 ug via INTRAVENOUS

## 2023-06-24 MED ORDER — PHENYLEPHRINE 80 MCG/ML (10ML) SYRINGE FOR IV PUSH (FOR BLOOD PRESSURE SUPPORT)
PREFILLED_SYRINGE | INTRAVENOUS | Status: AC
  Filled 2023-06-24: qty 10

## 2023-06-24 MED ORDER — APIXABAN 5 MG PO TABS
5.0000 mg | ORAL_TABLET | Freq: Two times a day (BID) | ORAL | Status: DC
  Administered 2023-06-25 – 2023-06-26 (×3): 5 mg via ORAL
  Filled 2023-06-24 (×4): qty 1

## 2023-06-24 MED ORDER — LIDOCAINE HCL (CARDIAC) PF 100 MG/5ML IV SOSY
PREFILLED_SYRINGE | INTRAVENOUS | Status: DC | PRN
  Administered 2023-06-24: 50 mg via INTRAVENOUS

## 2023-06-24 MED ORDER — ALPRAZOLAM 0.5 MG PO TABS
0.5000 mg | ORAL_TABLET | Freq: Every evening | ORAL | Status: DC | PRN
  Administered 2023-06-25 (×2): 0.5 mg via ORAL
  Filled 2023-06-24 (×2): qty 1

## 2023-06-24 NOTE — Anesthesia Postprocedure Evaluation (Signed)
Anesthesia Post Note  Patient: Patricia [name]  Procedure(s) Performed: BIOPSY COLONOSCOPY  Patient location during evaluation: Phase II Anesthesia Type: General Level of consciousness: awake and alert and oriented Pain management: pain level controlled Vital Signs Assessment: post-procedure vital signs reviewed and stable Respiratory status: spontaneous breathing, nonlabored ventilation and respiratory function stable Cardiovascular status: blood pressure returned to baseline and stable Postop Assessment: no apparent nausea or vomiting Anesthetic complications: no  No notable events documented.   Last Vitals:  Vitals:   06/24/23 1330 06/24/23 1349  BP: 128/68 (!) 119/94  Pulse:  62  Resp:    Temp:  36.6 C  SpO2: 99% 99%    Last Pain:  Vitals:   06/24/23 1349  TempSrc: Oral  PainSc:                  Delaynee Alred C Mio Schellinger

## 2023-06-24 NOTE — Progress Notes (Signed)
Initial Nutrition Assessment  DOCUMENTATION CODES:      INTERVENTION:  Boost Breeze po TID   Soft diet   Multivitamin daily  NUTRITION DIAGNOSIS:   Predicted suboptimal nutrient intake related to altered GI function (3 weeks diarrhea PTA) as evidenced by per patient/family report.   GOAL:  Patient will meet greater than or equal to 90% of their needs   MONITOR:  Supplement acceptance, PO intake, Weight trends  REASON FOR ASSESSMENT:   Malnutrition Screening Tool    ASSESSMENT: Patient is a 77 yo female with hx of non small cell lung cancer (followed at [facility]), GERD who presented with complaint of ongoing diarrhea x 3 weeks.   7/16 colonoscopy (biopsies obtained). Pathology pending. Patient reports no diarrhea today.   Diet advanced Soft/lactose free following procedure this morning. Feeding herself. She has ordered chicken noodle soup for dinner. Encouraged her to drink the [employer] [place] while she is building her nutrition intake.   Medications reviewed.      Wt Readings from Last 10 Encounters:  06/23/23 58.4 kg  06/12/23 56.2 kg  06/03/23 56.2 kg  05/13/23 59.5 kg  04/22/23 60.6 kg  04/01/23 59.9 kg  03/12/23 59.9 kg  03/11/23 59.1 kg  03/03/23 58.4 kg  02/18/23 56.2 kg  Weight stable between 56-60 kg the past 4 months- noted above.     Latest Ref Rng & Units 06/24/2023    5:12 AM 06/23/2023    5:17 AM 06/22/2023    7:40 PM  BMP  Glucose 70 - 99 mg/dL 409  811  914   BUN 8 - 23 mg/dL 7  12  16    Creatinine 0.44 - 1.00 mg/dL 7.82  9.56  2.13   Sodium 135 - 145 mmol/L 135  134  133   Potassium 3.5 - 5.1 mmol/L 3.1  3.7  2.5   Chloride 98 - 111 mmol/L 104  102  93   CO2 22 - 32 mmol/L 25  25  29    Calcium 8.9 - 10.3 mg/dL 8.3  8.2  9.4      Diet Order:   Diet Order             DIET SOFT Fluid consistency: Thin  Diet effective now                   EDUCATION NEEDS:  Education needs have been addressed  Skin:  Skin Assessment: Reviewed RN  Assessment  Last BM:  7/16 large type 6   Height:   Ht Readings from Last 1 Encounters:  06/23/23 4\' 10"  (1.473 m)    Weight:   Wt Readings from Last 1 Encounters:  06/23/23 58.4 kg    Ideal Body Weight:   45 kg  BMI:  Body mass index is 26.91 kg/m.  Estimated Nutritional Needs:   Kcal:  1700-1800  Protein:  90-98 gr  Fluid:  1.7-1.8 liters daily  Royann Shivers MS,RD,CSG,LDN Office: [phone]

## 2023-06-24 NOTE — Transfer of Care (Signed)
Immediate Anesthesia Transfer of Care Note  Patient: Patricia [name]  Procedure(s) Performed: BIOPSY COLONOSCOPY  Patient Location: PACU  Anesthesia Type:General  Level of Consciousness: awake  Airway & Oxygen Therapy: Patient Spontanous Breathing  Post-op Assessment: Report given to RN and Post -op Vital signs reviewed and stable  Post vital signs: Reviewed and stable  Last Vitals:  Vitals Value Taken Time  BP    Temp    Pulse    Resp    SpO2      Last Pain:  Vitals:   06/24/23 1241  TempSrc:   PainSc: 0-No pain         Complications: No notable events documented.

## 2023-06-24 NOTE — Op Note (Signed)
[facility] Patient Name: Patricia [name] Procedure Date: 06/24/2023 11:53 AM MRN: [id number] Date of Birth: 25-Sep-1946 Attending MD: Katrinka Blazing , , [id number] CSN: [id number] Age: 77 Admit Type: Inpatient Procedure:                Colonoscopy Indications:              Clinically significant diarrhea of unexplained                            origin Providers:                Katrinka Blazing Referring MD:              Medicines:                Monitored Anesthesia Care Complications:            No immediate complications. Estimated Blood Loss:     Estimated blood loss: none. Procedure:                Pre-Anesthesia Assessment:                           - Prior to the procedure, a History and Physical                            was performed, and patient medications, allergies                            and sensitivities were reviewed. The patient's                            tolerance of previous anesthesia was reviewed.                           - The risks and benefits of the procedure and the                            sedation options and risks were discussed with the                            patient. All questions were answered and informed                            consent was obtained.                           - ASA Grade Assessment: III - A patient with severe                            systemic disease.                           After obtaining informed consent, the colonoscope                            was passed under direct vision. Throughout the  procedure, the patient's blood pressure, pulse, and                            oxygen saturations were monitored continuously. The                            [id number]-[id number] ([id number]) scope was introduced through                            the anus and advanced to the the cecum, identified                            by appendiceal orifice and ileocecal valve. After                             obtaining informed consent, the colonoscope was                            passed under direct vision. Throughout the                            procedure, the patient's blood pressure, pulse, and                            oxygen saturations were monitored continuously.The                            colonoscopy was performed without difficulty. The                            patient tolerated the procedure well. The quality                            of the bowel preparation was good. Scope In: 12:47:15 PM Scope Out: 1:03:40 PM Scope Withdrawal Time: 0 hours 12 minutes 40 seconds  Total Procedure Duration: 0 hours 16 minutes 25 seconds  Findings:      The perianal and digital rectal examinations were normal.      A patchy area of mildly friable mucosa with contact bleeding was found       in the ascending colon and in the cecum. No presence of inflammation       otherwise. There was presence of mucus throughout the colon. Biopsies       from throughout the colon were taken with a cold forceps for histology.      The retroflexed view of the distal rectum and anal verge was normal and       showed no anal or rectal abnormalities. Impression:               - Friability with contact bleeding in the ascending                            colon and in the cecum. Biopsied.                           -  The distal rectum and anal verge are normal on                            retroflexion view. Moderate Sedation:      Per Anesthesia Care Recommendation:           - Return patient to hospital ward for ongoing care.                           - Soft diet. Lactose free.                           - Follow GI pathogen panel.                           - Await pathology results.                           - Repeat colonoscopy is not recommended due to                            current age (20 years or older) for screening                            purposes. Procedure Code(s):        --- Professional  ---                           [phone], Colonoscopy, flexible; with biopsy, single                            or multiple Diagnosis Code(s):        --- Professional ---                           K92.2, Gastrointestinal hemorrhage, unspecified                           R19.7, Diarrhea, unspecified CPT copyright 2022 American Medical Association. All rights reserved. The codes documented in this report are preliminary and upon coder review may  be revised to meet current compliance requirements. Katrinka Blazing, MD Katrinka Blazing,  06/24/2023 1:15:50 PM This report has been signed electronically. Number of Addenda: 0

## 2023-06-24 NOTE — Anesthesia Preprocedure Evaluation (Addendum)
Anesthesia Evaluation  Patient identified by MRN, date of birth, ID band Patient awake    Reviewed: Allergy & Precautions, H&P , NPO status , Patient's Chart, lab work & pertinent test results  History of Anesthesia Complications (+) PONV and history of anesthetic complications  Airway Mallampati: II  TM Distance: >3 FB Neck ROM: Full    Dental  (+) Upper Dentures, Lower Dentures   Pulmonary former smoker Left lung adenocarcinoma   Pulmonary exam normal breath sounds clear to auscultation       Cardiovascular hypertension, Pt. on medications + DVT  Normal cardiovascular exam Rhythm:Regular Rate:Normal     Neuro/Psych  Headaches PSYCHIATRIC DISORDERS Anxiety Depression     Neuromuscular disease CVA, Residual Symptoms    GI/Hepatic Neg liver ROS, hiatal hernia,GERD  Medicated and Controlled,,  Endo/Other  Hypothyroidism    Renal/GU negative Renal ROS  negative genitourinary   Musculoskeletal negative musculoskeletal ROS (+)    Abdominal   Peds negative pediatric ROS (+)  Hematology negative hematology ROS (+)   Anesthesia Other Findings   Reproductive/Obstetrics negative OB ROS                             Anesthesia Physical Anesthesia Plan  ASA: 3  Anesthesia Plan: General   Post-op Pain Management: Minimal or no pain anticipated   Induction: Intravenous  PONV Risk Score and Plan:   Airway Management Planned:   Additional Equipment:   Intra-op Plan:   Post-operative Plan:   Informed Consent: I have reviewed the patients History and Physical, chart, labs and discussed the procedure including the risks, benefits and alternatives for the proposed anesthesia with the patient or authorized representative who has indicated his/her understanding and acceptance.    Discussed DNR with patient and Suspend DNR.     Plan Discussed with: CRNA and Surgeon  Anesthesia Plan  Comments:        Anesthesia Quick Evaluation

## 2023-06-24 NOTE — Plan of Care (Signed)
  Problem: Clinical Measurements: Goal: Will remain free from infection Outcome: Progressing   Problem: Clinical Measurements: Goal: Diagnostic test results will improve Outcome: Progressing   Problem: Clinical Measurements: Goal: Cardiovascular complication will be avoided Outcome: Progressing   Problem: Activity: Goal: Risk for activity intolerance will decrease Outcome: Progressing

## 2023-06-24 NOTE — Progress Notes (Signed)
[facility] PROGRESS NOTE  Patricia [name] (DOB: 1946-07-07) WUJ:[id number] PCP: [name], MD  Brief Narrative: Patricia [name] is a 77 y.o. female with a history of NSCLC, HLD, GERD, hypothyroidism who presented to the ED on 06/22/2023 with 3 weeks of diarrhea and lower abdominal discomfort associated with poor oral intake. She was hypokalemic (K 2.5), with BUN 16, Cr 1.18 (baseline 0.7). CT demonstrated wall thickening of the descending colon from splenic flexure to rectum. IV fluids and antiemetics given, and patient admitted for colitis. Lactic acid is mildly elevated and cleared, Hgb down 14 > 11. Flexible sigmoidoscopy planned per GI 7/16.   Subjective: Lower abdominal soreness is stabler, unchanged. Still loose stools, no bleeding.  Objective: BP (!) 145/79   Pulse 63   Temp 98.3 F (36.8 C)   Resp 18   Ht 4\' 10"  (1.473 m)   Wt 58.4 kg   SpO2 97%   BMI 26.91 kg/m   Gen: No distress Pulm: Clear, nonlabored, stable vocal quality  CV: RRR GI: Soft, minimal tenderness along lower abdomen without rebound or guarding or masses, ND, + active BS Neuro: Alert and oriented. No new focal deficits. Ext: Warm, no deformities. Skin: No new rashes, lesions or ulcers on visualized skin   Assessment & Plan: Descending colitis: Exam reassuring. Lactic acid elevated though no acidosis, LA cleared on recheck.  - C. diff negative, GI pathogen panel is still pending.  - ?if infectious vs. due to [place] vs. ischemic (less likely).  - Appreciate GI recommendations, planning flex sig today. Had screening colonoscopy that showed no significant findings in 2018 by Dr. Jena Gauss.   Normocytic anemia: No bleeding reported, though hgb 14.7 > 11.7g/dl despite relative stability in WBC and plt's.  - Monitor in AM. Unclear utility of FOBT at this time. Holding eliquis for now.  Hypokalemia:  - Increase supplementation in IVF. Mg 1.8.   NSCLC:  - Follow up with Dr. Arbutus Ped.  - Will plan  not to give keytruda at this time.   AKI: Prerenal most likely with dehydration by exam and history otherwise. Dirty catch urine did not show casts by micro.  - Continue IVF given her ongoing GI losses.   History of CVA: Presents concomitant risk factor for mesenteric atherosclerosis.  - Continue eliquis (once recleared by GI), statin  HLD:  - Continue statin  GERD:  - Continue PPI  Hypothyroidism:  - Continue synthroid - Update TSH (last was 72 and synthroid started)  Tyrone Nine, MD [facility] [email or web address] 06/24/2023, 10:31 AM

## 2023-06-24 NOTE — Brief Op Note (Signed)
06/24/2023  1:13 PM  PATIENT:  Patricia [name]  77 y.o. female  PRE-OPERATIVE DIAGNOSIS:  diarrhea, colitis  POST-OPERATIVE DIAGNOSIS:  ascending colitis, friability in ascending and cecum, area of congestion sigmid colon  PROCEDURE:  Procedure(s): BIOPSY COLONOSCOPY  SURGEON:  Surgeons and Role:    * Dolores Frame, MD - Primary  Patient underwent colonoscopy under propofol sedation.  Tolerated the procedure adequately.   FINDINGS: - Friability with contact bleeding in the ascending colon and in the cecum.  Biopsied.  - The distal rectum and anal verge are normal on retroflexion view.   RECOMMENDATIONS - Return patient to hospital ward for ongoing care.  - Soft diet. Lactose free. - Follow GI pathogen panel. - Await pathology results.  - Repeat colonoscopy is not recommended due to current age (82 years or older) for screening purposes.   Katrinka Blazing, MD Gastroenterology and Hepatology [facility] Gastroenterology

## 2023-06-24 NOTE — Progress Notes (Addendum)
 We will proceed with colonoscopy as scheduled.  I thoroughly discussed with the patient the procedure, including the risks involved. Patient understands what the procedure involves including the benefits and any risks. Patient understands alternatives to the proposed procedure. Risks including (but not limited to) bleeding, tearing of the lining (perforation), rupture of adjacent organs, problems with heart and lung function, infection, and medication reactions. A small percentage of complications may require surgery, hospitalization, repeat endoscopic procedure, and/or transfusion.  Patient understood and agreed.  Patricia Peppers, MD Gastroenterology and Hepatology [facility] Gastroenterology

## 2023-06-24 NOTE — Discharge Instructions (Signed)
Nutrition Post Hospital Stay Proper nutrition can help your body recover from illness and injury.   Foods and beverages high in protein, vitamins, and minerals help rebuild muscle loss, promote healing, & reduce fall risk.   In addition to eating healthy foods, a nutrition shake is an easy, delicious way to get the nutrition you need during and after your hospital stay  It is recommended that you continue to drink 2 bottles per day of:       Ensure Clear or Boost Breeze for at least 1 month (30 days) after your hospital stay   Tips for adding a nutrition shake into your routine: As allowed, drink one with vitamins or medications instead of water or juice Enjoy one as a tasty mid-morning or afternoon snack Drink cold or make a milkshake out of it Drink one instead of milk with cereal or snacks Use as a coffee creamer   Available at the following grocery stores and pharmacies:           * Karin Golden * [employer] * [employer]co  * Rite Aid          * Walmart * [employer] Club  * Walgreens      * Target  * BJ's   * CVS  * [employer]   * [facility] [phone]            For COUPONS visit: www.ensure.com/join or RoleLink.com.br   Suggested Substitutions Ensure Plus = Boost Plus = Carnation Breakfast Essentials = Boost Compact Ensure Active Clear = Boost Breeze Glucerna Shake = Boost Glucose Control = Carnation Breakfast Essentials SUGAR FREE

## 2023-06-24 NOTE — Progress Notes (Signed)
Patient with complaints of pain at I.V. insertion site, fluids infusing without issue. No signs of infiltration, however patient demanded I.V. be removed and stated that she did not want another one placed. Dr. Jarvis Newcomer contacted and order given to leave I.V. out.

## 2023-06-24 NOTE — Anesthesia Procedure Notes (Signed)
Date/Time: 06/24/2023 12:51 PM  Performed by: Julian Reil, CRNAPre-anesthesia Checklist: Patient identified, Emergency Drugs available, Suction available and Patient being monitored Patient Re-evaluated:Patient Re-evaluated prior to induction Oxygen Delivery Method: Nasal cannula Induction Type: IV induction Placement Confirmation: positive ETCO2

## 2023-06-25 DIAGNOSIS — E876 Hypokalemia: Secondary | ICD-10-CM | POA: Diagnosis not present

## 2023-06-25 DIAGNOSIS — K52832 Lymphocytic colitis: Secondary | ICD-10-CM | POA: Diagnosis not present

## 2023-06-25 DIAGNOSIS — K529 Noninfective gastroenteritis and colitis, unspecified: Secondary | ICD-10-CM | POA: Diagnosis not present

## 2023-06-25 DIAGNOSIS — E782 Mixed hyperlipidemia: Secondary | ICD-10-CM | POA: Diagnosis not present

## 2023-06-25 DIAGNOSIS — C3492 Malignant neoplasm of unspecified part of left bronchus or lung: Secondary | ICD-10-CM

## 2023-06-25 DIAGNOSIS — K219 Gastro-esophageal reflux disease without esophagitis: Secondary | ICD-10-CM | POA: Diagnosis not present

## 2023-06-25 LAB — CBC
HCT: 32.3 % — ABNORMAL LOW (ref 36.0–46.0)
Hemoglobin: 10.7 g/dL — ABNORMAL LOW (ref 12.0–15.0)
MCH: 30.3 pg (ref 26.0–34.0)
MCHC: 33.1 g/dL (ref 30.0–36.0)
MCV: 91.5 fL (ref 80.0–100.0)
Platelets: 161 10*3/uL (ref 150–400)
RBC: 3.53 MIL/uL — ABNORMAL LOW (ref 3.87–5.11)
RDW: 13.1 % (ref 11.5–15.5)
WBC: 6.5 10*3/uL (ref 4.0–10.5)
nRBC: 0 % (ref 0.0–0.2)

## 2023-06-25 LAB — BASIC METABOLIC PANEL
Anion gap: 6 (ref 5–15)
BUN: 9 mg/dL (ref 8–23)
CO2: 27 mmol/L (ref 22–32)
Calcium: 8.3 mg/dL — ABNORMAL LOW (ref 8.9–10.3)
Chloride: 103 mmol/L (ref 98–111)
Creatinine, Ser: 0.94 mg/dL (ref 0.44–1.00)
GFR, Estimated: >60 mL/min (ref >60)
Glucose, Bld: 104 mg/dL — ABNORMAL HIGH (ref 70–99)
Potassium: 2.9 mmol/L — ABNORMAL LOW (ref 3.5–5.1)
Sodium: 136 mmol/L (ref 135–145)

## 2023-06-25 LAB — SURGICAL PATHOLOGY

## 2023-06-25 MED ORDER — BUDESONIDE 3 MG PO CPEP
9.0000 mg | ORAL_CAPSULE | Freq: Every day | ORAL | Status: DC
  Administered 2023-06-25 – 2023-06-26 (×2): 9 mg via ORAL
  Filled 2023-06-25 (×2): qty 3

## 2023-06-25 NOTE — Progress Notes (Signed)
[facility] PROGRESS NOTE  Patricia [name] (DOB: 1946-04-03) [id number] PCP: [name], MD  Brief Narrative: Patricia [name] is a 77 y.o. female with a history of NSCLC, HLD, GERD, hypothyroidism who presented to the ED on 06/22/2023 with 3 weeks of diarrhea and lower abdominal discomfort associated with poor oral intake. She was hypokalemic (K 2.5), with BUN 16, Cr 1.18 (baseline 0.7). CT demonstrated wall thickening of the descending colon from splenic flexure to rectum. IV fluids and antiemetics given, and patient admitted for colitis. Lactic acid is mildly elevated and cleared, Hgb down 14 > 11. Flexible sigmoidoscopy planned per GI 7/16.   Subjective: No fever, no chest pain, no nausea or vomiting.  Reports feeling better and expressing less discomfort in her abdomen.  Stools are forming up and patient denies overt bleeding.  Objective: BP 133/69 (BP Location: Right Arm)   Pulse 63   Temp 98 F (36.7 C) (Oral)   Resp 18   Ht 4\' 10"  (1.473 m)   Wt 58.4 kg   SpO2 99%   BMI 26.91 kg/m    General exam: Alert, awake, oriented x 3; afebrile; no overnight events. Respiratory system: Clear to auscultation. Respiratory effort normal.  Good saturation on room air. Cardiovascular system:RRR. No rubs or gallops. Gastrointestinal system: Abdomen is nondistended, soft and sickly/diffusely tender on deep palpation.  No guarding.  Positive bowel sounds. Central nervous system: Alert and oriented. No focal neurological deficits. Extremities: No cyanosis, clubbing or edema. Skin: No petechiae. Psychiatry: Judgement and insight appear normal. Mood & affect appropriate.    Assessment & Plan: Descending colitis: Exam reassuring. Lactic acid elevated though no acidosis -Lactic acid completely resolved after fluid resuscitation -C. difficile negative; GI pathogen panel pending -Possible colitis due to Keytruda versus ischemic versus infectious -Continue supportive care -Continue  to follow GI pathogen panel -Continue to follow recommendation by gastroenterology service. -Continue to advance diet and follow clinical response.  Normocytic anemia: No bleeding reported, though hgb 14.7 > 11.7g/dl despite relative stability in WBC and plt's.  -Continue to follow hemoglobin trend. -Continue holding eliquis for now.  Hypokalemia:  -In the setting of GI losses -Continue to follow electrolytes and further replete as needed -Magnesium stable.  NSCLC:  -Continue outpatient follow up with Dr. Arbutus Ped.  - Will plan not to give keytruda at this time.  -Further decisions and future treatment to be decided by oncology service.  AKI: Prerenal most likely with dehydration by exam and history otherwise. -Patient denies dysuria -Continue adequate hydration and follow renal function trend.  History of CVA:  -No new neurologic deficit appreciated -Continue statin -Resume Eliquis for secondary prevention once cleared by gastroenterology service.  HLD:  - Continue statin  GERD:  - Continue PPI  Hypothyroidism:  - Continue synthroid -TSH 22.448 (coming down from 72.815) -Continue outpatient follow-up of thyroid panel; adequately responding.  Vassie Loll, MD [facility] [email or web address] 06/25/2023, 2:07 PM

## 2023-06-25 NOTE — Plan of Care (Signed)

## 2023-06-25 NOTE — Progress Notes (Addendum)
Subjective: Feeling okay this morning. Has had 3 BMs that were more formed. No rectal bleeding or melena. Notes stools more green in color, no nausea or vomiting.   Objective: Vital signs in last 24 hours: Temp:  [97.4 F (36.3 C)-98.4 F (36.9 C)] 97.4 F (36.3 C) (07/17 0517) Pulse Rate:  [57-65] 59 (07/17 0517) Resp:  [11-18] 18 (07/17 0517) BP: (87-128)/(54-94) 98/54 (07/17 0517) SpO2:  [93 %-99 %] 96 % (07/17 0517) Last BM Date : 06/24/23 General:   Alert and oriented, pleasant Eyes:  No icterus, sclera clear. Conjuctiva pink.  Mouth:  Without lesions, mucosa pink and moist.   Heart:  S1, S2 present, no murmurs noted.  Lungs: Clear to auscultation bilaterally, without wheezing, rales, or rhonchi.  Abdomen:  Bowel sounds present, soft, non-tender, non-distended. No HSM or hernias noted. No rebound or guarding. No masses appreciated  Msk:  Symmetrical without gross deformities. Normal posture. Extremities:  Without clubbing or edema. Neurologic:  Alert and  oriented x4;  grossly normal neurologically. Skin:  Warm and dry, intact without significant lesions.  Psych:  Alert and cooperative. Normal mood and affect.  Intake/Output from previous day: 07/16 0701 - 07/17 0700 In: 780 [P.O.:480; I.V.:300] Out: -  Intake/Output this shift: No intake/output data recorded.  Lab Results: Recent Labs    06/22/23 1940 06/23/23 0517 06/25/23 0430  WBC 5.6 5.3 6.5  HGB 14.4 11.7* 10.7*  HCT 43.8 35.9* 32.3*  PLT 216 199 161   BMET Recent Labs    06/23/23 0517 06/24/23 0512 06/25/23 0430  NA 134* 135 136  K 3.7 3.1* 2.9*  CL 102 104 103  CO2 25 25 27   GLUCOSE 112* 106* 104*  BUN 12 7* 9  CREATININE 1.02* 0.93 0.94  CALCIUM 8.2* 8.3* 8.3*   LFT Recent Labs    06/22/23 1940 06/23/23 0517  PROT 7.0 5.3*  ALBUMIN 3.8 2.9*  AST 25 23  ALT 24 22  ALKPHOS 101 75  BILITOT 0.6 0.4   Assessment: Patricia [name] is a 77  year old female with medical history of HLD  GERD, Stage IV (T1c, N3, M1 C)  non-small cell lung cancer (follows with Dr. Shirline Frees at [facility]), stroke on Eliquis, GERD, hypothyroidism, who presented to the ED on 7/15 due to 3-weeks of multiple watery BMs daily with associated nausea, vomiting, abdominal pain, poor PO intake,CT imaging with colitis. GI consulted for further evaluation/management.   Colitis: CT with findings compatible with colitis from splenic flexure to the level of the rectum.  Having at least 10 watery BMs per day with nocturnal stools.  Has associated weight loss.  No leukocytosis or fevers.  No recent antibiotics.  Did start levothyroxine 1 month ago, (TSH 22.448),  last colonoscopy in 2018 was normal.  Patient underwent colonoscopy on Tuesday with findings of friability with contact bleeding in ascending colon and cecum which were biopsied-path is pending. C diff testing negative, GI pathogen panel pending.  She is feeling better today and reassuringly with more solid stools reported this am. Would recommend continuing soft/lactose free diet for now, will follow for GI pathogen panel/pathology results.    Plan: Soft/lactose free diet Follow pathology results Follow for GI path panel results Continue with supportive measures    LOS: 3 days    06/25/2023, 9:20 AM   Chelsea L. Jeanmarie Hubert, MSN, APRN, AGNP-C Adult-Gerontology Nurse Practitioner [facility] Gastroenterology at [facility]   **Addendum, Path report showed lymphocytic colitis, will start budesonide 9mg  daily which  will need to be continued for atleast 8 weeks, can taper steroids thereafter pending clinical course.   Attending note: Colon biopsies are positive for lymphocytic colitis.  Agree with beginning budesonide.  I explained this entity to patient and family members at the bedside. Will consider a slow taper in 8 weeks depending on her progress.

## 2023-06-25 NOTE — Progress Notes (Signed)
Patient slept on and off this shift, frequent assist to Bedside commode. Patient requested something for a headache and sleep, tylenol and xanax given. Patient showed signs of beginning to get confusion when sun went down, but able to reorient to current place and situation. Continued to monitor and assist to restroom.

## 2023-06-25 NOTE — Progress Notes (Signed)
Mobility Specialist Progress Note:    06/25/23 1218  Mobility  Activity Ambulated with assistance in hallway  Level of Assistance Minimal assist, patient does 75% or more  Assistive Device Front wheel walker  Distance Ambulated (ft) 80 ft  Range of Motion/Exercises Active;All extremities  Activity Response Tolerated well  Mobility Referral Yes  $Mobility charge 1 Mobility  Mobility Specialist Start Time (ACUTE ONLY) 1200  Mobility Specialist Stop Time (ACUTE ONLY) 1210  Mobility Specialist Time Calculation (min) (ACUTE ONLY) 10 min   Pt received in chair in room, agreeable to mobility session. MinA required to stand, CGA for safety with RW. Ambulated in hallway, tolerated well, asx throughout. Returned pt to chair in room, husband at bedside, all needs met.   Feliciana Rossetti Mobility Specialist Please contact via Special educational needs teacher or  Rehab office at [phone]

## 2023-06-26 DIAGNOSIS — K52832 Lymphocytic colitis: Secondary | ICD-10-CM

## 2023-06-26 DIAGNOSIS — E782 Mixed hyperlipidemia: Secondary | ICD-10-CM | POA: Diagnosis not present

## 2023-06-26 DIAGNOSIS — K529 Noninfective gastroenteritis and colitis, unspecified: Secondary | ICD-10-CM | POA: Diagnosis not present

## 2023-06-26 DIAGNOSIS — K219 Gastro-esophageal reflux disease without esophagitis: Secondary | ICD-10-CM | POA: Diagnosis not present

## 2023-06-26 MED ORDER — POTASSIUM CHLORIDE CRYS ER 20 MEQ PO TBCR: 40.0000 meq | EXTENDED_RELEASE_TABLET | Freq: Two times a day (BID) | ORAL | 1 refills | Status: DC

## 2023-06-26 MED ORDER — BUDESONIDE 3 MG PO CPEP: 9.0000 mg | ORAL_CAPSULE | Freq: Every day | ORAL | 0 refills | Status: AC

## 2023-06-26 MED ORDER — PANTOPRAZOLE SODIUM 40 MG PO TBEC: 40.0000 mg | DELAYED_RELEASE_TABLET | Freq: Every day | ORAL | 1 refills | Status: DC

## 2023-06-26 NOTE — Plan of Care (Signed)
  Problem: Clinical Measurements: Goal: Cardiovascular complication will be avoided 06/26/2023 0708 by Sundra Aland, RN Outcome: Progressing 06/26/2023 0704 by Sundra Aland, RN Outcome: Progressing   Problem: Activity: Goal: Risk for activity intolerance will decrease Outcome: Progressing   Problem: Nutrition: Goal: Adequate nutrition will be maintained 06/26/2023 0709 by Sundra Aland, RN Outcome: Progressing 06/26/2023 0708 by Sundra Aland, RN Outcome: Progressing 06/26/2023 0704 by Sundra Aland, RN Outcome: Progressing   Problem: Coping: Goal: Level of anxiety will decrease Outcome: Progressing   Problem: Elimination: Goal: Will not experience complications related to bowel motility 06/26/2023 0709 by Sundra Aland, RN Outcome: Progressing 06/26/2023 0708 by Sundra Aland, RN Outcome: Progressing   Problem: Pain Managment: Goal: General experience of comfort will improve 06/26/2023 0709 by Sundra Aland, RN Outcome: Progressing 06/26/2023 0708 by Sundra Aland, RN Outcome: Progressing   Problem: Safety: Goal: Ability to remain free from injury will improve Outcome: Progressing   Problem: Skin Integrity: Goal: Risk for impaired skin integrity will decrease 06/26/2023 0709 by Sundra Aland, RN Outcome: Progressing 06/26/2023 0708 by Sundra Aland, RN Outcome: Progressing

## 2023-06-26 NOTE — Discharge Summary (Signed)
Physician Discharge Summary   Patient: Patricia [name] MRN: [id number] DOB: 07-28-1946  Admit date:     06/22/2023  Discharge date: 06/26/23  Discharge Physician: Vassie Loll   PCP: [name], MD   Recommendations at discharge:  Repeat basic metabolic panel to follow electrolytes and renal function Reassess blood pressure and further adjust antihypertensive regimen as needed. Make sure patient follow-up with gastroenterology service as instructed Repeat CBC to follow hemoglobin trend/stability  Discharge Diagnoses: Principal Problem:   Acute colitis Active Problems:   Mixed hyperlipidemia   GERD without esophagitis   Non-small cell lung cancer, left (HCC)   Hypokalemia   Dehydration   Prolonged QT interval   Acquired hypothyroidism   History of stroke   Diarrhea   Lymphocytic colitis  Resolved Problems:   * No resolved hospital problems. [facility] admission narrative: [name] is a 77 y.o. female with a history of NSCLC, HLD, GERD, hypothyroidism who presented to the ED on 06/22/2023 with 3 weeks of diarrhea and lower abdominal discomfort associated with poor oral intake. She was hypokalemic (K 2.5), with BUN 16, Cr 1.18 (baseline 0.7). CT demonstrated wall thickening of the descending colon from splenic flexure to rectum. IV fluids and antiemetics given, and patient admitted for colitis. Lactic acid is mildly elevated and cleared, Hgb down 14 > 11.   Assessment and Plan: Descending colitis: Exam reassuring. Lactic acid elevated though no acidosis. -Lactic acid completely resolved after fluid resuscitation -C. difficile negative -Pathology demonstrating the presence of lymphocytic colitis -Following GI service recommendation patient started on paternal side with intention to treat for 8 weeks; stopping the use of Paxil and Prevacid. -Okay to continue daily Protonix for GI prophylaxis and gastroesophageal reflux disease therapy. -Continue to maintain  adequate hydration and follow-up with    Normocytic anemia: No bleeding reported, though hgb 14.7 > 11.7g/dl despite relative stability in WBC and plt's.  -Continue to follow hemoglobin trend. -Continue holding eliquis for now.   Hypokalemia:  -In the setting of GI losses -Magnesium stable, within normal limits; patient electrolytes were repleted and daily supplementation adjusted at discharge. -Continue to closely follow electrolytes trend.   NSCLC:  -Continue outpatient follow up with Dr. Arbutus Ped.  - Will plan not to give keytruda at this time.  -Further decisions and future treatment to be decided by oncology service.   AKI: Prerenal most likely with dehydration by exam and history otherwise. -Patient denies dysuria -Continue to maintain adequate hydration -Renal function improving back to normal at discharge.   History of CVA:  -No new neurologic deficit appreciated -Continue statin -Resume Eliquis for secondary prevention once cleared by gastroenterology service.   HLD:  - Continue statin   GERD:  - Continue PPI   Hypothyroidism:  - Continue synthroid -TSH 22.448 (coming down from 72.815) -Continue outpatient follow-up of thyroid panel; adequately responding.  Essential hypertension -Blood pressure stable without the use of hydrochlorothiazide -Advised to follow heart healthy diet and to follow-up with PCP in the next 10 days.  Consultants: Gastroenterology service Procedures performed: See below for x-ray reports; flexible sigmoidoscopy and biopsy. Disposition: Home Diet recommendation: Heart healthy diet.  DISCHARGE MEDICATION: Allergies as of 06/26/2023       Reactions   Codeine Nausea Only        Medication List     STOP taking these medications    docusate sodium 100 MG capsule Commonly known as: COLACE   famotidine 20 MG tablet Commonly known as: PEPCID  hydrochlorothiazide 25 MG tablet Commonly known as: HYDRODIURIL   lansoprazole 30 MG  capsule Commonly known as: PREVACID Replaced by: pantoprazole 40 MG tablet   methylPREDNISolone 4 MG Tbpk tablet Commonly known as: MEDROL DOSEPAK   PARoxetine 40 MG tablet Commonly known as: PAXIL   Senexon-S 8.6-50 MG tablet Generic drug: senna-docusate       TAKE these medications    ALPRAZolam 0.5 MG tablet Commonly known as: XANAX Take 1 tablet (0.5 mg total) by mouth at bedtime as needed for anxiety or sleep. TAKE (1) TABLET BY MOUTH AT BEDTIME AS NEEDED FOR SLEEP.   Analgesic Balm 10-15 % Crea Apply 1 Application topically 3 (three) times daily to bilateral calves   apixaban 5 MG Tabs tablet Commonly known as: ELIQUIS Take 1 tablet (5 mg total) by mouth 2 (two) times daily.   benzonatate 100 MG capsule Commonly known as: TESSALON TAKE 1 CAPSULE (100 MG TOTAL) BY MOUTH THREE TIMES DAILY.   budesonide 3 MG 24 hr capsule Commonly known as: ENTOCORT EC Take 3 capsules (9 mg total) by mouth daily. Start taking on: June 27, 2023   butalbital-acetaminophen-caffeine 50-325-40 MG tablet Commonly known as: FIORICET TAKE 1/2 TABLET BY MOUTH EVERY 6 (SIX) HOURS AS NEEDED FOR HEADACHE.   cyclobenzaprine 5 MG tablet Commonly known as: FLEXERIL Take 1 tablet (5 mg total) by mouth 3 (three) times daily as needed for muscle spasms (neck/shoulder pain).   levothyroxine 50 MCG tablet Commonly known as: SYNTHROID TAKE 1 TABLET BY MOUTH DAILY BEFORE BREAKFAST   multivitamin with minerals Tabs tablet Take 1 tablet by mouth daily.   ondansetron 8 MG tablet Commonly known as: ZOFRAN Take 1 tablet (8 mg total) by mouth every 8 (eight) hours as needed for nausea or vomiting.   pantoprazole 40 MG tablet Commonly known as: PROTONIX Take 1 tablet (40 mg total) by mouth daily. Start taking on: June 27, 2023 Replaces: lansoprazole 30 MG capsule   potassium chloride SA 20 MEQ tablet Commonly known as: Klor-Con M20 Take 2 tablets (40 mEq total) by mouth 2 (two) times  daily. What changed:  how much to take when to take this   prochlorperazine 10 MG tablet Commonly known as: COMPAZINE Take 1 tablet (10 mg total) by mouth every 6 (six) hours as needed for nausea or vomiting.   rosuvastatin 20 MG tablet Commonly known as: CRESTOR Take 1 tablet (20 mg total) by mouth daily.   topiramate 100 MG tablet Commonly known as: TOPAMAX Take 1 tablet (100 mg total) by mouth 2 (two) times daily.        Follow-up Information     Luking, Jonna Coup, MD. Schedule an appointment as soon as possible for a visit in 10 day(s).   Specialty: Family Medicine Contact information: [address] [place] [address] [phone]                Discharge Exam: Ceasar Mons Weights   06/22/23 1907 06/23/23 0043  Weight: 56.2 kg 58.4 kg   General exam: Alert, awake, oriented x 3; afebrile; no overnight events. Respiratory system: Clear to auscultation. Respiratory effort normal.  Good saturation on room air. Cardiovascular system:RRR. No rubs or gallops. Gastrointestinal system: Abdomen is nondistended, soft and sickly/diffusely tender on deep palpation.  No guarding.  Positive bowel sounds. Central nervous system: Alert and oriented. No focal neurological deficits. Extremities: No cyanosis, clubbing or edema. Skin: No petechiae. Psychiatry: Judgement and insight appear normal. Mood & affect appropriate.  Condition at discharge: Stable and improved.  The results of significant diagnostics from this hospitalization (including imaging, microbiology, ancillary and laboratory) are listed below for reference.   Imaging Studies: CT ABDOMEN PELVIS W CONTRAST  Result Date: 06/22/2023 CLINICAL DATA:  Right lower quadrant abdominal pain. History of non-small cell lung cancer. EXAM: CT ABDOMEN AND PELVIS WITH CONTRAST TECHNIQUE: Multidetector CT imaging of the abdomen and pelvis was performed using the standard protocol following bolus administration of  intravenous contrast. RADIATION DOSE REDUCTION: This exam was performed according to the departmental dose-optimization program which includes automated exposure control, adjustment of the mA and/or kV according to patient size and/or use of iterative reconstruction technique. CONTRAST:  OMNIPAQUE IOHEXOL 300 MG/ML  SOLN COMPARISON:  CT abdomen and pelvis 05/30/2023.  PET-CT 12/06/2022 FINDINGS: Lower chest: Left lower lobe pulmonary nodule measuring 1 cm appears unchanged. Hepatobiliary: No focal liver abnormality is seen. No gallstones, gallbladder wall thickening, or biliary dilatation. Pancreas: Unremarkable. No pancreatic ductal dilatation or surrounding inflammatory changes. Spleen: Normal in size without focal abnormality. Adrenals/Urinary Tract: Adrenal glands are unremarkable. Kidneys are normal, without renal calculi, focal lesion, or hydronephrosis. Small left bladder diverticulum is again seen. The bladder is otherwise within normal limits. Stomach/Bowel: Large hiatal is again seen containing the proximal stomach. Stomach is nondilated. Small bowel loops are within normal limits. There is wall thickening of the splenic flexure to the level the rectum without surrounding inflammation. The appendix is within normal limits. There is no pneumatosis, bowel obstruction or free air. Vascular/Lymphatic: Aortic atherosclerosis. No enlarged abdominal or pelvic lymph nodes. Reproductive: Status post hysterectomy. No adnexal masses. Other: No abdominal wall hernia or abnormality. No abdominopelvic ascites. Musculoskeletal: Degenerative changes affect the spine. IMPRESSION: 1. Findings compatible with nonspecific colitis from the splenic flexure to the level of the rectum. No bowel obstruction or free air. 2. Stable large hiatal hernia. 3. Stable 1 cm left lower lobe pulmonary nodule. Aortic Atherosclerosis (ICD10-I70.0). Electronically Signed   By: Darliss Cheney M.D.   On: 06/22/2023 21:46   CT Head Wo  Contrast  Result Date: 06/12/2023 CLINICAL DATA:  Headache, new onset EXAM: CT HEAD WITHOUT CONTRAST TECHNIQUE: Contiguous axial images were obtained from the base of the skull through the vertex without intravenous contrast. RADIATION DOSE REDUCTION: This exam was performed according to the departmental dose-optimization program which includes automated exposure control, adjustment of the mA and/or kV according to patient size and/or use of iterative reconstruction technique. COMPARISON:  Brain MRI 02/28/2023 FINDINGS: Brain: No evidence of acute infarction, hemorrhage, hydrocephalus, extra-axial collection or mass lesion/mass effect. Moderate chronic infarct along the superior right frontal convexity. Mild cerebral volume loss and chronic small vessel ischemia for age. Vascular: No hyperdense vessel or unexpected calcification. Skull: Normal. Negative for fracture or focal lesion. Sinuses/Orbits: No acute finding. IMPRESSION: 1. No acute or reversible finding. 2. Chronic right frontal cortex infarct. Electronically Signed   By: Tiburcio Pea M.D.   On: 06/12/2023 05:57   CT Chest W Contrast  Result Date: 06/03/2023 CLINICAL DATA:  77 year old female with history of non-small cell lung cancer. Follow-up study. * Tracking Code: BO * EXAM: CT CHEST, ABDOMEN, AND PELVIS WITH CONTRAST TECHNIQUE: Multidetector CT imaging of the chest, abdomen and pelvis was performed following the standard protocol during bolus administration of intravenous contrast. RADIATION DOSE REDUCTION: This exam was performed according to the departmental dose-optimization program which includes automated exposure control, adjustment of the mA and/or kV according to patient size and/or use of iterative reconstruction  technique. CONTRAST:  69mL OMNIPAQUE IOHEXOL 300 MG/ML  SOLN COMPARISON:  CT of the chest, abdomen and pelvis 03/30/2022. FINDINGS: CT CHEST FINDINGS Cardiovascular: Heart size is normal. There is no significant pericardial  fluid, thickening or pericardial calcification. There is aortic atherosclerosis, as well as atherosclerosis of the great vessels of the mediastinum and the coronary arteries, including calcified atherosclerotic plaque in the left anterior descending coronary artery. Mediastinum/Nodes: No pathologically enlarged mediastinal or hilar lymph nodes. Prominent but nonenlarged nodal tissue in the superior mediastinum and prevascular nodal stations, similar to the prior post treatment CT examination, compatible with treated nodal metastasis. Moderate to large hiatal hernia. No axillary lymphadenopathy. Lungs/Pleura: Previously treated left lower lobe pulmonary nodule appears similar to the recent prior examination, currently measuring 1.6 x 1.0 cm (axial image 72 of series 6). 7 mm ground-glass attenuation right upper lobe nodule (axial image 48 of series 6), nonspecific. No other new suspicious appearing pulmonary nodules or masses are noted. No acute consolidative airspace disease. Mild linear scarring in the lung bases bilaterally. Musculoskeletal: There are no aggressive appearing lytic or blastic lesions noted in the visualized portions of the skeleton. CT ABDOMEN PELVIS FINDINGS Hepatobiliary: No suspicious appearing cystic or solid hepatic lesions. No intra or extrahepatic biliary ductal dilatation. Gallbladder is moderately distended, but otherwise unremarkable in appearance. Pancreas: No pancreatic mass. No pancreatic ductal dilatation. No pancreatic or peripancreatic fluid collections or inflammatory changes. Spleen: Unremarkable. Adrenals/Urinary Tract: Mild multifocal cortical thinning in the kidneys bilaterally (left-greater-than-right). No suspicious renal lesions. No hydroureteronephrosis. Urinary bladder is unremarkable in appearance. Bilateral adrenal glands are normal in appearance. Stomach/Bowel: The appearance of the intra-abdominal portion of the stomach is unremarkable. No pathologic dilatation of  small bowel or colon. Normal appendix. Vascular/Lymphatic: Aortic atherosclerosis, without evidence of aneurysm or dissection in the abdominal or pelvic vasculature. No lymphadenopathy noted in the abdomen or pelvis. Reproductive: Status post hysterectomy. Ovaries are unremarkable in appearance. Other: Small well-circumscribed 1.4 x 1.0 cm low-attenuation lesion in the anatomic pelvis (axial image 93 of series 2), which appears separate from the adjacent colon and urinary bladder, stable compared to prior examinations dating back to 12/06/2022, at which point this lesion did not demonstrate hypermetabolism, presumably a benign lesion such as an area of old fat necrosis (no imaging follow-up recommended). No significant volume of ascites. No pneumoperitoneum. Musculoskeletal: Subcutaneous soft tissue nodules seen on prior PET-CT are not readily apparent on today's examination. There are no aggressive appearing lytic or blastic lesions noted in the visualized portions of the skeleton. IMPRESSION: 1. Stable size of treated left lower lobe pulmonary nodule. 2. 7 mm ground-glass attenuation nodule in the right upper lobe, nonspecific. Attention on follow-up studies is recommended. 3. Treated mediastinal lymphadenopathy appears unchanged. No new lymphadenopathy identified. 4. No signs of metastatic disease in the abdomen or pelvis on today's examination. Specifically, previously noted right adrenal metastasis, peritoneal and retroperitoneal nodules and subcutaneous and intramuscular nodules seen on prior PET-CT are not readily apparent on today's examination, indicating a positive response to therapy. 5. Aortic atherosclerosis, in addition to left anterior descending coronary artery disease. Assessment for potential risk factor modification, dietary therapy or pharmacologic therapy may be warranted, if clinically indicated. 6. Additional incidental findings, as above. Electronically Signed   By: Trudie Reed M.D.    On: 06/03/2023 10:27   CT Abdomen Pelvis W Contrast  Result Date: 06/03/2023 CLINICAL DATA:  77 year old female with history of non-small cell lung cancer. Follow-up study. * Tracking Code: BO * EXAM: CT  CHEST, ABDOMEN, AND PELVIS WITH CONTRAST TECHNIQUE: Multidetector CT imaging of the chest, abdomen and pelvis was performed following the standard protocol during bolus administration of intravenous contrast. RADIATION DOSE REDUCTION: This exam was performed according to the departmental dose-optimization program which includes automated exposure control, adjustment of the mA and/or kV according to patient size and/or use of iterative reconstruction technique. CONTRAST:  69mL OMNIPAQUE IOHEXOL 300 MG/ML  SOLN COMPARISON:  CT of the chest, abdomen and pelvis 03/30/2022. FINDINGS: CT CHEST FINDINGS Cardiovascular: Heart size is normal. There is no significant pericardial fluid, thickening or pericardial calcification. There is aortic atherosclerosis, as well as atherosclerosis of the great vessels of the mediastinum and the coronary arteries, including calcified atherosclerotic plaque in the left anterior descending coronary artery. Mediastinum/Nodes: No pathologically enlarged mediastinal or hilar lymph nodes. Prominent but nonenlarged nodal tissue in the superior mediastinum and prevascular nodal stations, similar to the prior post treatment CT examination, compatible with treated nodal metastasis. Moderate to large hiatal hernia. No axillary lymphadenopathy. Lungs/Pleura: Previously treated left lower lobe pulmonary nodule appears similar to the recent prior examination, currently measuring 1.6 x 1.0 cm (axial image 72 of series 6). 7 mm ground-glass attenuation right upper lobe nodule (axial image 48 of series 6), nonspecific. No other new suspicious appearing pulmonary nodules or masses are noted. No acute consolidative airspace disease. Mild linear scarring in the lung bases bilaterally. Musculoskeletal:  There are no aggressive appearing lytic or blastic lesions noted in the visualized portions of the skeleton. CT ABDOMEN PELVIS FINDINGS Hepatobiliary: No suspicious appearing cystic or solid hepatic lesions. No intra or extrahepatic biliary ductal dilatation. Gallbladder is moderately distended, but otherwise unremarkable in appearance. Pancreas: No pancreatic mass. No pancreatic ductal dilatation. No pancreatic or peripancreatic fluid collections or inflammatory changes. Spleen: Unremarkable. Adrenals/Urinary Tract: Mild multifocal cortical thinning in the kidneys bilaterally (left-greater-than-right). No suspicious renal lesions. No hydroureteronephrosis. Urinary bladder is unremarkable in appearance. Bilateral adrenal glands are normal in appearance. Stomach/Bowel: The appearance of the intra-abdominal portion of the stomach is unremarkable. No pathologic dilatation of small bowel or colon. Normal appendix. Vascular/Lymphatic: Aortic atherosclerosis, without evidence of aneurysm or dissection in the abdominal or pelvic vasculature. No lymphadenopathy noted in the abdomen or pelvis. Reproductive: Status post hysterectomy. Ovaries are unremarkable in appearance. Other: Small well-circumscribed 1.4 x 1.0 cm low-attenuation lesion in the anatomic pelvis (axial image 93 of series 2), which appears separate from the adjacent colon and urinary bladder, stable compared to prior examinations dating back to 12/06/2022, at which point this lesion did not demonstrate hypermetabolism, presumably a benign lesion such as an area of old fat necrosis (no imaging follow-up recommended). No significant volume of ascites. No pneumoperitoneum. Musculoskeletal: Subcutaneous soft tissue nodules seen on prior PET-CT are not readily apparent on today's examination. There are no aggressive appearing lytic or blastic lesions noted in the visualized portions of the skeleton. IMPRESSION: 1. Stable size of treated left lower lobe pulmonary  nodule. 2. 7 mm ground-glass attenuation nodule in the right upper lobe, nonspecific. Attention on follow-up studies is recommended. 3. Treated mediastinal lymphadenopathy appears unchanged. No new lymphadenopathy identified. 4. No signs of metastatic disease in the abdomen or pelvis on today's examination. Specifically, previously noted right adrenal metastasis, peritoneal and retroperitoneal nodules and subcutaneous and intramuscular nodules seen on prior PET-CT are not readily apparent on today's examination, indicating a positive response to therapy. 5. Aortic atherosclerosis, in addition to left anterior descending coronary artery disease. Assessment for potential risk factor modification, dietary therapy or  pharmacologic therapy may be warranted, if clinically indicated. 6. Additional incidental findings, as above. Electronically Signed   By: Trudie Reed M.D.   On: 06/03/2023 10:27    Microbiology: Results for orders placed or performed during the hospital encounter of 06/22/23  C Difficile Quick Screen w PCR reflex     Status: None   Collection Time: 06/23/23  2:15 AM   Specimen: Rectum; Stool  Result Value Ref Range Status   C Diff antigen NEGATIVE NEGATIVE Final   C Diff toxin NEGATIVE NEGATIVE Final   C Diff interpretation No C. difficile detected.  Final    Comment: Performed at [facility], [address], [place], [place] [address]    Labs: CBC: Recent Labs  Lab 06/22/23 1940 06/23/23 0517 06/25/23 0430  WBC 5.6 5.3 6.5  HGB 14.4 11.7* 10.7*  HCT 43.8 35.9* 32.3*  MCV 91.3 91.8 91.5  PLT 216 199 161   Basic Metabolic Panel: Recent Labs  Lab 06/22/23 1940 06/23/23 0517 06/24/23 0512 06/25/23 0430  NA 133* 134* 135 136  K 2.5* 3.7 3.1* 2.9*  CL 93* 102 104 103  CO2 29 25 25 27   GLUCOSE 117* 112* 106* 104*  BUN 16 12 7* 9  CREATININE 1.18* 1.02* 0.93 0.94  CALCIUM 9.4 8.2* 8.3* 8.3*  MG 2.1 1.8  --   --   PHOS  --  2.5  --   --    Liver Function  Tests: Recent Labs  Lab 06/22/23 1940 06/23/23 0517  AST 25 23  ALT 24 22  ALKPHOS 101 75  BILITOT 0.6 0.4  PROT 7.0 5.3*  ALBUMIN 3.8 2.9*   CBG: No results for input(s): "GLUCAP" in the last 168 hours.  Discharge time spent: greater than 30 minutes.  Signed: Vassie Loll, MD [facility] 06/26/2023

## 2023-06-26 NOTE — Plan of Care (Signed)
  Problem: Clinical Measurements: Goal: Cardiovascular complication will be avoided Outcome: Progressing   Problem: Activity: Goal: Risk for activity intolerance will decrease Outcome: Progressing   Problem: Nutrition: Goal: Adequate nutrition will be maintained Outcome: Progressing   Problem: Coping: Goal: Level of anxiety will decrease Outcome: Progressing   

## 2023-06-26 NOTE — Progress Notes (Addendum)
Gastroenterology Progress Note   Referring Provider: No ref. provider found Primary Care Physician:  [name], MD Patient ID: Patricia [name]; [id number]; 04/08/1946   Subjective:    She has several loose stools after drinking chocolate Ensure yesterday afternoon. Feels much better today. Has had two stools this morning, she reports they are more solid, little pieces. No melena, brbpr. She did not show hospital staff. Tolerating diet. She denies any abdominal pain.   Objective:   Vital signs in last 24 hours: Temp:  [98 F (36.7 C)-98.8 F (37.1 C)] 98.8 F (37.1 C) (07/18 0324) Pulse Rate:  [57-63] 62 (07/18 0324) Resp:  [16-18] 16 (07/18 0324) BP: (122-133)/(60-69) 122/60 (07/18 0324) SpO2:  [96 %-99 %] 97 % (07/18 0324) Last BM Date : 06/25/23 General:   Alert,  Well-developed, well-nourished, pleasant and cooperative in NAD Head:  Normocephalic and atraumatic. Eyes:  Sclera clear, no icterus.   Abdomen:  Soft, nontender and nondistended. Normal bowel sounds, without guarding, and without rebound.   Extremities:  Without clubbing, deformity or edema. Neurologic:  Alert and  oriented x4  Skin:  Intact without significant lesions or rashes. Psych:  Alert and cooperative. Normal mood and affect.  Intake/Output from previous day: 07/17 0701 - 07/18 0700 In: 960 [P.O.:960] Out: -  Intake/Output this shift: No intake/output data recorded.  Lab Results: CBC Recent Labs    06/25/23 0430  WBC 6.5  HGB 10.7*  HCT 32.3*  MCV 91.5  PLT 161   BMET Recent Labs    06/24/23 0512 06/25/23 0430  NA 135 136  K 3.1* 2.9*  CL 104 103  CO2 25 27  GLUCOSE 106* 104*  BUN 7* 9  CREATININE 0.93 0.94  CALCIUM 8.3* 8.3*   LFTs No results for input(s): "BILITOT", "BILIDIR", "IBILI", "ALKPHOS", "AST", "ALT", "PROT", "ALBUMIN" in the last 72 hours. No results for input(s): "LIPASE" in the last 72 hours. PT/INR No results for input(s): "LABPROT", "INR" in the last 72  hours.       Imaging Studies: CT ABDOMEN PELVIS W CONTRAST  Result Date: 06/22/2023 CLINICAL DATA:  Right lower quadrant abdominal pain. History of non-small cell lung cancer. EXAM: CT ABDOMEN AND PELVIS WITH CONTRAST TECHNIQUE: Multidetector CT imaging of the abdomen and pelvis was performed using the standard protocol following bolus administration of intravenous contrast. RADIATION DOSE REDUCTION: This exam was performed according to the departmental dose-optimization program which includes automated exposure control, adjustment of the mA and/or kV according to patient size and/or use of iterative reconstruction technique. CONTRAST:  OMNIPAQUE IOHEXOL 300 MG/ML  SOLN COMPARISON:  CT abdomen and pelvis 05/30/2023.  PET-CT 12/06/2022 FINDINGS: Lower chest: Left lower lobe pulmonary nodule measuring 1 cm appears unchanged. Hepatobiliary: No focal liver abnormality is seen. No gallstones, gallbladder wall thickening, or biliary dilatation. Pancreas: Unremarkable. No pancreatic ductal dilatation or surrounding inflammatory changes. Spleen: Normal in size without focal abnormality. Adrenals/Urinary Tract: Adrenal glands are unremarkable. Kidneys are normal, without renal calculi, focal lesion, or hydronephrosis. Small left bladder diverticulum is again seen. The bladder is otherwise within normal limits. Stomach/Bowel: Large hiatal is again seen containing the proximal stomach. Stomach is nondilated. Small bowel loops are within normal limits. There is wall thickening of the splenic flexure to the level the rectum without surrounding inflammation. The appendix is within normal limits. There is no pneumatosis, bowel obstruction or free air. Vascular/Lymphatic: Aortic atherosclerosis. No enlarged abdominal or pelvic lymph nodes. Reproductive: Status post hysterectomy. No adnexal masses.  Other: No abdominal wall hernia or abnormality. No abdominopelvic ascites. Musculoskeletal: Degenerative changes affect the  spine. IMPRESSION: 1. Findings compatible with nonspecific colitis from the splenic flexure to the level of the rectum. No bowel obstruction or free air. 2. Stable large hiatal hernia. 3. Stable 1 cm left lower lobe pulmonary nodule. Aortic Atherosclerosis (ICD10-I70.0). Electronically Signed   By: Darliss Cheney M.D.   On: 06/22/2023 21:46   CT Head Wo Contrast  Result Date: 06/12/2023 CLINICAL DATA:  Headache, new onset EXAM: CT HEAD WITHOUT CONTRAST TECHNIQUE: Contiguous axial images were obtained from the base of the skull through the vertex without intravenous contrast. RADIATION DOSE REDUCTION: This exam was performed according to the departmental dose-optimization program which includes automated exposure control, adjustment of the mA and/or kV according to patient size and/or use of iterative reconstruction technique. COMPARISON:  Brain MRI 02/28/2023 FINDINGS: Brain: No evidence of acute infarction, hemorrhage, hydrocephalus, extra-axial collection or mass lesion/mass effect. Moderate chronic infarct along the superior right frontal convexity. Mild cerebral volume loss and chronic small vessel ischemia for age. Vascular: No hyperdense vessel or unexpected calcification. Skull: Normal. Negative for fracture or focal lesion. Sinuses/Orbits: No acute finding. IMPRESSION: 1. No acute or reversible finding. 2. Chronic right frontal cortex infarct. Electronically Signed   By: Tiburcio Pea M.D.   On: 06/12/2023 05:57   CT Chest W Contrast  Result Date: 06/03/2023 CLINICAL DATA:  77 year old female with history of non-small cell lung cancer. Follow-up study. * Tracking Code: BO * EXAM: CT CHEST, ABDOMEN, AND PELVIS WITH CONTRAST TECHNIQUE: Multidetector CT imaging of the chest, abdomen and pelvis was performed following the standard protocol during bolus administration of intravenous contrast. RADIATION DOSE REDUCTION: This exam was performed according to the departmental dose-optimization program which  includes automated exposure control, adjustment of the mA and/or kV according to patient size and/or use of iterative reconstruction technique. CONTRAST:  69mL OMNIPAQUE IOHEXOL 300 MG/ML  SOLN COMPARISON:  CT of the chest, abdomen and pelvis 03/30/2022. FINDINGS: CT CHEST FINDINGS Cardiovascular: Heart size is normal. There is no significant pericardial fluid, thickening or pericardial calcification. There is aortic atherosclerosis, as well as atherosclerosis of the great vessels of the mediastinum and the coronary arteries, including calcified atherosclerotic plaque in the left anterior descending coronary artery. Mediastinum/Nodes: No pathologically enlarged mediastinal or hilar lymph nodes. Prominent but nonenlarged nodal tissue in the superior mediastinum and prevascular nodal stations, similar to the prior post treatment CT examination, compatible with treated nodal metastasis. Moderate to large hiatal hernia. No axillary lymphadenopathy. Lungs/Pleura: Previously treated left lower lobe pulmonary nodule appears similar to the recent prior examination, currently measuring 1.6 x 1.0 cm (axial image 72 of series 6). 7 mm ground-glass attenuation right upper lobe nodule (axial image 48 of series 6), nonspecific. No other new suspicious appearing pulmonary nodules or masses are noted. No acute consolidative airspace disease. Mild linear scarring in the lung bases bilaterally. Musculoskeletal: There are no aggressive appearing lytic or blastic lesions noted in the visualized portions of the skeleton. CT ABDOMEN PELVIS FINDINGS Hepatobiliary: No suspicious appearing cystic or solid hepatic lesions. No intra or extrahepatic biliary ductal dilatation. Gallbladder is moderately distended, but otherwise unremarkable in appearance. Pancreas: No pancreatic mass. No pancreatic ductal dilatation. No pancreatic or peripancreatic fluid collections or inflammatory changes. Spleen: Unremarkable. Adrenals/Urinary Tract: Mild  multifocal cortical thinning in the kidneys bilaterally (left-greater-than-right). No suspicious renal lesions. No hydroureteronephrosis. Urinary bladder is unremarkable in appearance. Bilateral adrenal glands are normal in appearance. Stomach/Bowel: The  appearance of the intra-abdominal portion of the stomach is unremarkable. No pathologic dilatation of small bowel or colon. Normal appendix. Vascular/Lymphatic: Aortic atherosclerosis, without evidence of aneurysm or dissection in the abdominal or pelvic vasculature. No lymphadenopathy noted in the abdomen or pelvis. Reproductive: Status post hysterectomy. Ovaries are unremarkable in appearance. Other: Small well-circumscribed 1.4 x 1.0 cm low-attenuation lesion in the anatomic pelvis (axial image 93 of series 2), which appears separate from the adjacent colon and urinary bladder, stable compared to prior examinations dating back to 12/06/2022, at which point this lesion did not demonstrate hypermetabolism, presumably a benign lesion such as an area of old fat necrosis (no imaging follow-up recommended). No significant volume of ascites. No pneumoperitoneum. Musculoskeletal: Subcutaneous soft tissue nodules seen on prior PET-CT are not readily apparent on today's examination. There are no aggressive appearing lytic or blastic lesions noted in the visualized portions of the skeleton. IMPRESSION: 1. Stable size of treated left lower lobe pulmonary nodule. 2. 7 mm ground-glass attenuation nodule in the right upper lobe, nonspecific. Attention on follow-up studies is recommended. 3. Treated mediastinal lymphadenopathy appears unchanged. No new lymphadenopathy identified. 4. No signs of metastatic disease in the abdomen or pelvis on today's examination. Specifically, previously noted right adrenal metastasis, peritoneal and retroperitoneal nodules and subcutaneous and intramuscular nodules seen on prior PET-CT are not readily apparent on today's examination, indicating a  positive response to therapy. 5. Aortic atherosclerosis, in addition to left anterior descending coronary artery disease. Assessment for potential risk factor modification, dietary therapy or pharmacologic therapy may be warranted, if clinically indicated. 6. Additional incidental findings, as above. Electronically Signed   By: Trudie Reed M.D.   On: 06/03/2023 10:27   CT Abdomen Pelvis W Contrast  Result Date: 06/03/2023 CLINICAL DATA:  77 year old female with history of non-small cell lung cancer. Follow-up study. * Tracking Code: BO * EXAM: CT CHEST, ABDOMEN, AND PELVIS WITH CONTRAST TECHNIQUE: Multidetector CT imaging of the chest, abdomen and pelvis was performed following the standard protocol during bolus administration of intravenous contrast. RADIATION DOSE REDUCTION: This exam was performed according to the departmental dose-optimization program which includes automated exposure control, adjustment of the mA and/or kV according to patient size and/or use of iterative reconstruction technique. CONTRAST:  69mL OMNIPAQUE IOHEXOL 300 MG/ML  SOLN COMPARISON:  CT of the chest, abdomen and pelvis 03/30/2022. FINDINGS: CT CHEST FINDINGS Cardiovascular: Heart size is normal. There is no significant pericardial fluid, thickening or pericardial calcification. There is aortic atherosclerosis, as well as atherosclerosis of the great vessels of the mediastinum and the coronary arteries, including calcified atherosclerotic plaque in the left anterior descending coronary artery. Mediastinum/Nodes: No pathologically enlarged mediastinal or hilar lymph nodes. Prominent but nonenlarged nodal tissue in the superior mediastinum and prevascular nodal stations, similar to the prior post treatment CT examination, compatible with treated nodal metastasis. Moderate to large hiatal hernia. No axillary lymphadenopathy. Lungs/Pleura: Previously treated left lower lobe pulmonary nodule appears similar to the recent prior  examination, currently measuring 1.6 x 1.0 cm (axial image 72 of series 6). 7 mm ground-glass attenuation right upper lobe nodule (axial image 48 of series 6), nonspecific. No other new suspicious appearing pulmonary nodules or masses are noted. No acute consolidative airspace disease. Mild linear scarring in the lung bases bilaterally. Musculoskeletal: There are no aggressive appearing lytic or blastic lesions noted in the visualized portions of the skeleton. CT ABDOMEN PELVIS FINDINGS Hepatobiliary: No suspicious appearing cystic or solid hepatic lesions. No intra or extrahepatic biliary ductal dilatation.  Gallbladder is moderately distended, but otherwise unremarkable in appearance. Pancreas: No pancreatic mass. No pancreatic ductal dilatation. No pancreatic or peripancreatic fluid collections or inflammatory changes. Spleen: Unremarkable. Adrenals/Urinary Tract: Mild multifocal cortical thinning in the kidneys bilaterally (left-greater-than-right). No suspicious renal lesions. No hydroureteronephrosis. Urinary bladder is unremarkable in appearance. Bilateral adrenal glands are normal in appearance. Stomach/Bowel: The appearance of the intra-abdominal portion of the stomach is unremarkable. No pathologic dilatation of small bowel or colon. Normal appendix. Vascular/Lymphatic: Aortic atherosclerosis, without evidence of aneurysm or dissection in the abdominal or pelvic vasculature. No lymphadenopathy noted in the abdomen or pelvis. Reproductive: Status post hysterectomy. Ovaries are unremarkable in appearance. Other: Small well-circumscribed 1.4 x 1.0 cm low-attenuation lesion in the anatomic pelvis (axial image 93 of series 2), which appears separate from the adjacent colon and urinary bladder, stable compared to prior examinations dating back to 12/06/2022, at which point this lesion did not demonstrate hypermetabolism, presumably a benign lesion such as an area of old fat necrosis (no imaging follow-up  recommended). No significant volume of ascites. No pneumoperitoneum. Musculoskeletal: Subcutaneous soft tissue nodules seen on prior PET-CT are not readily apparent on today's examination. There are no aggressive appearing lytic or blastic lesions noted in the visualized portions of the skeleton. IMPRESSION: 1. Stable size of treated left lower lobe pulmonary nodule. 2. 7 mm ground-glass attenuation nodule in the right upper lobe, nonspecific. Attention on follow-up studies is recommended. 3. Treated mediastinal lymphadenopathy appears unchanged. No new lymphadenopathy identified. 4. No signs of metastatic disease in the abdomen or pelvis on today's examination. Specifically, previously noted right adrenal metastasis, peritoneal and retroperitoneal nodules and subcutaneous and intramuscular nodules seen on prior PET-CT are not readily apparent on today's examination, indicating a positive response to therapy. 5. Aortic atherosclerosis, in addition to left anterior descending coronary artery disease. Assessment for potential risk factor modification, dietary therapy or pharmacologic therapy may be warranted, if clinically indicated. 6. Additional incidental findings, as above. Electronically Signed   By: Trudie Reed M.D.   On: 06/03/2023 10:27  [2 weeks]  Assessment:   Patricia [name] is a 77  year old female with medical history of HLD GERD, Stage IV (T1c, N3, M1 C)  non-small cell lung cancer (follows with Dr. Shirline Frees at [facility]), stroke on Eliquis, GERD, hypothyroidism, who presented to the ED on 7/15 due to 3-weeks of multiple watery BMs daily with associated nausea, vomiting, abdominal pain, poor PO intake,CT imaging with colitis. GI consulted for further evaluation/management.    Colitis: CT with findings compatible with colitis from splenic flexure to the level of the rectum.  Having at least 10 watery BMs per day with nocturnal stools.  Has associated weight loss.  No leukocytosis or fevers.  No  recent antibiotics.  Did start levothyroxine 1 month ago, (TSH 22.448),  last colonoscopy in 2018 was normal.   Patient underwent colonoscopy on Tuesday with findings of friability with contact bleeding in ascending colon and cecum which were biopsied-path c/w lymphocytic colitis. C diff testing negative, GI pathogen panel pending.   She is feeling better today. Reported two stools so far today, more formed. Would recommend continuing soft/lactose free diet for now, will follow for GI pathogen panel results.     Plan:   Follow up GI path panel as available. Lactose free diet for now. Continue budesonide 9mg  daily for at least 8 weeks. We can reassess in the office at that time and determine her taper based on clinical course.  Consider recheck  potassium today, leave to attending's discretion.  GI to sign off. Please reach out with any questions/concerns.   LOS: 4 days   Leanna Battles. [facility] [phone] 7/18/20249:24 AM

## 2023-06-27 ENCOUNTER — Telehealth: Payer: Self-pay | Admitting: *Deleted

## 2023-06-27 ENCOUNTER — Telehealth: Payer: Self-pay

## 2023-06-27 NOTE — Consult Note (Signed)
[facility] Customer service manager [facility]) Accountable Care Organization (ACO) [facility] Liaison Note  06/27/2023  Patricia [name] [birth year] [id number]  Location: [facility] RN Hospital Liaison screened the patient remotely at [facility].  Insurance: [employer]   Patricia [name] is a 77 y.o. female who is a Primary Care Patient of [place], Jonna Coup, MD [facility]). The patient was screened for readmission hospitalization with noted extreme risk score for unplanned readmission risk with 3 IP/1 ED in 6 months.  The patient was assessed for potential [facility]) Care Management service needs for post hospital transition for care coordination. Review of patient's electronic medical record reveals patient was admitted for Diarrhea. Pt discharged home in the care of her spouse with no HHealth services. Pt followed heavily by the oncology team for her management of care.   Plan: [facility] Liaison will continue to follow progress and disposition to asess for post hospital community care coordination/management needs.  Referral request for community care coordination: anticipate [facility] Transitions of Care Team follow up.   [facility] Care Management/Population Health does not replace or interfere with any arrangements made by the Inpatient Transition of Care team.   For questions contact:   Elliot Cousin, RN, [facility] Liaison [place]   Population Health Office Hours MTWF  8:00 am-6:00 pm Off on Thursday [phone] mobile [phone] [Office toll free line] Office Hours are M-F 8:30 - 5 pm 24 hour nurse advise line [phone]  [email or web address]

## 2023-06-27 NOTE — Telephone Encounter (Signed)
She was recently discharged from the hospital. Her granddaughter called to get information about a hospital follow up and rescheduling her missed appointments last week.

## 2023-06-27 NOTE — Transitions of Care (Post Inpatient/ED Visit) (Signed)
   06/27/2023  Name: Patricia [name] MRN: [id number] DOB: [birth year]  Today's TOC FU Call Status: Today's TOC FU Call Status:: Unsuccessul Call (1st Attempt) Unsuccessful Call (1st Attempt) Date: 06/27/23  Attempted to reach the patient regarding the most recent Inpatient/ED visit.  Follow Up Plan: Additional outreach attempts will be made to reach the patient to complete the Transitions of Care (Post Inpatient/ED visit) call.   Jodelle Gross, RN, BSN, CCM Care Management Coordinator [place] Phone: [phone]/Fax: [phone]

## 2023-06-30 ENCOUNTER — Telehealth: Payer: Self-pay

## 2023-06-30 ENCOUNTER — Encounter (HOSPITAL_COMMUNITY): Payer: Self-pay | Admitting: Gastroenterology

## 2023-06-30 NOTE — Transitions of Care (Post Inpatient/ED Visit) (Signed)
06/30/2023  Name: Patricia [name] MRN: [id number] DOB: [birth year]  Today's TOC FU Call Status: Today's TOC FU Call Status:: Successful TOC FU Call Competed TOC FU Call Complete Date: 06/30/23  Transition Care Management Follow-up Telephone Call Date of Discharge: 06/26/23 [facility] Type of Discharge: Inpatient Admission Primary Inpatient Discharge Diagnosis:: Acute Colitis How have you been since you were released from the hospital?: Better Any questions or concerns?: No  Items Reviewed: Did you receive and understand the discharge instructions provided?: Yes Medications obtained,verified, and reconciled?: Partial Review Completed Reason for Partial Mediation Review: Patient unable to complete Any new allergies since your discharge?: No Dietary orders reviewed?: No Do you have support at home?: Yes People in Home: spouse Name of Support/Comfort Primary Source: [place] [employer]  Medications Reviewed Today: Medications Reviewed Today     Reviewed by Jodelle Gross, RN (Case Manager) on 06/30/23 at 1028  Med List Status: <None>   Medication Order Taking? Sig Documenting Provider Last Dose Status Informant  ALPRAZolam (XANAX) 0.5 MG tablet [id number]  Take 1 tablet (0.5 mg total) by mouth at bedtime as needed for anxiety or sleep. TAKE (1) TABLET BY MOUTH AT BEDTIME AS NEEDED FOR SLEEP. [name], MD  Active Self, Pharmacy Records, Spouse/Significant Other           Med Note Electa Sniff, [facility]   Mon Jun 30, 2023 10:28 AM) TOC Unable to do a complete medication review  apixaban (ELIQUIS) 5 MG TABS tablet [id number]  Take 1 tablet (5 mg total) by mouth 2 (two) times daily. [name], MD  Active Self, Pharmacy Records, Spouse/Significant Other  benzonatate (TESSALON) 100 MG capsule [id number]  TAKE 1 CAPSULE (100 MG TOTAL) BY MOUTH THREE TIMES DAILY. Heilingoetter, Cassandra L, PA-C  Active Self, Pharmacy Records, Spouse/Significant Other  budesonide (ENTOCORT  EC) 3 MG 24 hr capsule [id number] Yes Take 3 capsules (9 mg total) by mouth daily. Vassie Loll, MD Taking Active   butalbital-acetaminophen-caffeine (FIORICET) [phone] MG tablet [id number]  TAKE 1/2 TABLET BY MOUTH EVERY 6 (SIX) HOURS AS NEEDED FOR HEADACHE. [name], MD  Active Self, Pharmacy Records, Spouse/Significant Other  cyclobenzaprine (FLEXERIL) 5 MG tablet [id number]  Take 1 tablet (5 mg total) by mouth 3 (three) times daily as needed for muscle spasms (neck/shoulder pain). Love, Evlyn Kanner, PA-C  Active Self, Pharmacy Records, Spouse/Significant Other  levothyroxine (SYNTHROID) 50 MCG tablet [id number]  TAKE 1 TABLET BY MOUTH DAILY BEFORE Earleen Newport, MD  Active Self, Pharmacy Records, Spouse/Significant Other  Menthol-Methyl Salicylate (MUSCLE RUB) 10-15 % CREA [id number]  Apply 1 Application topically 3 (three) times daily to bilateral calves Love, Evlyn Kanner, PA-C  Active Self, Pharmacy Records, Spouse/Significant Other  Multiple Vitamin (MULTIVITAMIN WITH MINERALS) TABS tablet [id number]  Take 1 tablet by mouth daily. [provider]  Active Self, Pharmacy Records, Spouse/Significant Other  ondansetron (ZOFRAN) 8 MG tablet [id number]  Take 1 tablet (8 mg total) by mouth every 8 (eight) hours as needed for nausea or vomiting. Heilingoetter, Cassandra L, PA-C  Active Self, Pharmacy Records, Spouse/Significant Other  pantoprazole (PROTONIX) 40 MG tablet [id number]  Take 1 tablet (40 mg total) by mouth daily. Vassie Loll, MD  Active   potassium chloride SA (KLOR-CON M20) 20 MEQ tablet [id number]  Take 2 tablets (40 mEq total) by mouth 2 (two) times daily. Vassie Loll, MD  Active   prochlorperazine (COMPAZINE) 10 MG tablet [id number]  Take 1 tablet (10 mg total) by mouth every 6 (six)  hours as needed for nausea or vomiting. Jacquelynn Cree, PA-C  Active Self, Pharmacy Records, Spouse/Significant Other  rosuvastatin (CRESTOR) 20 MG tablet [id number]  Take 1 tablet (20 mg  total) by mouth daily. [name], MD  Active Self, Pharmacy Records, Spouse/Significant Other  topiramate (TOPAMAX) 100 MG tablet [id number]  Take 1 tablet (100 mg total) by mouth 2 (two) times daily. [name], MD  Active Self, Pharmacy Records, Spouse/Significant Other            Home Care and Equipment/Supplies: Were Home Health Services Ordered?: No Any new equipment or medical supplies ordered?: No  Functional Questionnaire: Do you need assistance with bathing/showering or dressing?: Yes Do you need assistance with meal preparation?: Yes Do you need assistance with eating?: No Do you have difficulty maintaining continence: No Do you need assistance with getting out of bed/getting out of a chair/moving?: No Do you have difficulty managing or taking your medications?: Yes  Follow up appointments reviewed: PCP Follow-up appointment confirmed?: Yes Date of PCP follow-up appointment?: 07/10/23 Follow-up Provider: Dr. Gerda Diss Specialist [facility] Follow-up appointment confirmed?: Yes Date of Specialist follow-up appointment?: 07/15/23 Follow-Up Specialty Provider:: Dr. Theodis Blaze Do you need transportation to your follow-up appointment?: No Do you understand care options if your condition(s) worsen?: Yes-patient verbalized understanding  SDOH Interventions Today    Flowsheet Row Most Recent Value  SDOH Interventions   Food Insecurity Interventions Intervention Not Indicated  Housing Interventions Intervention Not Indicated      Jodelle Gross, RN, BSN, CCM Care Management Coordinator [facility] Phone: [phone]/Fax: [phone]

## 2023-07-03 DIAGNOSIS — R1030 Lower abdominal pain, unspecified: Secondary | ICD-10-CM

## 2023-07-08 ENCOUNTER — Encounter: Payer: Self-pay | Admitting: Gastroenterology

## 2023-07-10 ENCOUNTER — Telehealth: Payer: Self-pay

## 2023-07-10 ENCOUNTER — Encounter: Payer: Self-pay | Admitting: Family Medicine

## 2023-07-10 ENCOUNTER — Ambulatory Visit (INDEPENDENT_AMBULATORY_CARE_PROVIDER_SITE_OTHER): Payer: Medicare HMO | Admitting: Family Medicine

## 2023-07-10 ENCOUNTER — Other Ambulatory Visit (HOSPITAL_COMMUNITY)
Admission: RE | Admit: 2023-07-10 | Discharge: 2023-07-10 | Disposition: A | Discharge status: Home / Self Care | Payer: Medicare HMO | Source: Ambulatory Visit | Attending: Family Medicine | Admitting: Family Medicine

## 2023-07-10 VITALS — BP 106/74 | HR 77 | Temp 97.0°F | Ht <= 58 in | Wt 123.0 lb

## 2023-07-10 DIAGNOSIS — R519 Headache, unspecified: Secondary | ICD-10-CM | POA: Diagnosis not present

## 2023-07-10 DIAGNOSIS — F320 Major depressive disorder, single episode, mild: Secondary | ICD-10-CM

## 2023-07-10 DIAGNOSIS — E876 Hypokalemia: Secondary | ICD-10-CM

## 2023-07-10 DIAGNOSIS — K529 Noninfective gastroenteritis and colitis, unspecified: Secondary | ICD-10-CM | POA: Diagnosis not present

## 2023-07-10 DIAGNOSIS — R1013 Epigastric pain: Secondary | ICD-10-CM | POA: Diagnosis not present

## 2023-07-10 DIAGNOSIS — R490 Dysphonia: Secondary | ICD-10-CM

## 2023-07-10 DIAGNOSIS — D649 Anemia, unspecified: Secondary | ICD-10-CM | POA: Diagnosis not present

## 2023-07-10 LAB — CBC WITH DIFFERENTIAL/PLATELET
Abs Immature Granulocytes: 0.02 10*3/uL (ref 0.00–0.07)
Basophils Absolute: 0.1 10*3/uL (ref 0.0–0.1)
Basophils Relative: 1 %
Eosinophils Absolute: 0.1 10*3/uL (ref 0.0–0.5)
Eosinophils Relative: 1 %
HCT: 42.8 % (ref 36.0–46.0)
Hemoglobin: 13.6 g/dL (ref 12.0–15.0)
Immature Granulocytes: 0 %
Lymphocytes Relative: 34 %
Lymphs Abs: 2.7 10*3/uL (ref 0.7–4.0)
MCH: 29.8 pg (ref 26.0–34.0)
MCHC: 31.8 g/dL (ref 30.0–36.0)
MCV: 93.9 fL (ref 80.0–100.0)
Monocytes Absolute: 0.5 10*3/uL (ref 0.1–1.0)
Monocytes Relative: 6 %
Neutro Abs: 4.6 10*3/uL (ref 1.7–7.7)
Neutrophils Relative %: 58 %
Platelets: 291 10*3/uL (ref 150–400)
RBC: 4.56 MIL/uL (ref 3.87–5.11)
RDW: 13.8 % (ref 11.5–15.5)
WBC: 7.9 10*3/uL (ref 4.0–10.5)
nRBC: 0 % (ref 0.0–0.2)

## 2023-07-10 LAB — BASIC METABOLIC PANEL
Anion gap: 6 (ref 5–15)
BUN: 19 mg/dL (ref 8–23)
CO2: 26 mmol/L (ref 22–32)
Calcium: 9.6 mg/dL (ref 8.9–10.3)
Chloride: 106 mmol/L (ref 98–111)
Creatinine, Ser: 0.87 mg/dL (ref 0.44–1.00)
GFR, Estimated: >60 mL/min (ref >60)
Glucose, Bld: 127 mg/dL — ABNORMAL HIGH (ref 70–99)
Potassium: 4.6 mmol/L (ref 3.5–5.1)
Sodium: 138 mmol/L (ref 135–145)

## 2023-07-10 LAB — HEPATIC FUNCTION PANEL
ALT: 36 U/L (ref 0–44)
AST: 25 U/L (ref 15–41)
Albumin: 3.9 g/dL (ref 3.5–5.0)
Alkaline Phosphatase: 103 U/L (ref 38–126)
Bilirubin, Direct: 0.1 mg/dL (ref 0.0–0.2)
Indirect Bilirubin: 0.4 mg/dL (ref 0.3–0.9)
Total Bilirubin: 0.5 mg/dL (ref 0.3–1.2)
Total Protein: 7 g/dL (ref 6.5–8.1)

## 2023-07-10 LAB — LIPID PANEL
Cholesterol: 157 mg/dL (ref 0–200)
HDL: 47 mg/dL (ref >40)
LDL Cholesterol: 82 mg/dL (ref 0–99)
Total CHOL/HDL Ratio: 3.3 RATIO
Triglycerides: 142 mg/dL (ref <150)
VLDL: 28 mg/dL (ref 0–40)

## 2023-07-10 LAB — LIPASE, BLOOD: Lipase: 32 U/L (ref 11–51)

## 2023-07-10 LAB — MAGNESIUM: Magnesium: 2.2 mg/dL (ref 1.7–2.4)

## 2023-07-10 MED ORDER — SERTRALINE HCL 25 MG PO TABS: 25.0000 mg | ORAL_TABLET | Freq: Every day | ORAL | 3 refills | Status: DC

## 2023-07-10 MED ORDER — FAMOTIDINE 40 MG PO TABS: 40.0000 mg | ORAL_TABLET | Freq: Every day | ORAL | 5 refills | Status: DC

## 2023-07-10 NOTE — Progress Notes (Signed)
   Subjective:    Patient ID: Patricia [name], female    DOB: 1946-07-18, 77 y.o.   MRN: [id number]  HPI Hospital follow up for diarrhea - patient reports having headaches since started taking entocort ec , also started pantoprazole and states still having plenty of acid reflux after meals  Reports being dizzy all the time especially after standing - has fallen  coughing with lung cancer - how often needs to take tessalon pearls Patient recently in the hospital for significant colitis issues She was placed on some medicines She feels that those medicines are triggering some headaches She also has a dry cough that has been present for many weeks Relates hoarseness related to her lung cancer She is followed by several specialist She had low potassium while in the hospital  Review of Systems     Objective:   Physical Exam  General-in no acute distress Eyes-no discharge Lungs-respiratory rate normal, CTA CV-no murmurs,RRR Extremities skin warm dry no edema Neuro grossly normal Behavior normal, alert       Assessment & Plan:   1. Hypokalemia Check lab work.  Continue current measures. - Basic Metabolic Panel - CBC with Differential - Hepatic Function Panel - Lipase - Magnesium  2. Frequent headaches Recent CT scan negative.  Patient thinks it is related to her colitis medicine but for now she needs to take this medicine will relay a message to GI she will need a GI follow-up as well within the next 3 to 4 weeks  3. Hoarseness This is due to her cancer she is working as best she can with it  4. Anemia, unspecified type We will recheck hemoglobin  5. Epigastric pain Check lab work awaiting results  6. Depression, major, single episode, mild (HCC) Low-dose sertraline should help with her depression follow-up with [name] within the course of the next 3 months and follow-up with oncology regular basis  Colitis-patient is concerned that the medication could be triggering  headaches we will connect with gastroenterology to make sure that they do a follow-up visit with her

## 2023-07-13 NOTE — Progress Notes (Unsigned)
[name] OFFICE PROGRESS NOTE  Patricia Sciara, MD [address] [place] [address]  DIAGNOSIS: Stage IV (T1c, N3, M1 C) non-small cell lung cancer, adenocarcinoma presented with left upper lobe metastatic neoplasm presented with left lower lobe lung nodule in addition to extensive lymphadenopathy involving the neck, left hilar and mediastinal lymphadenopathy as well as upper abdomen lymph nodes and multiple subcutaneous/muscular nodules diagnosed in January 2024.   Molecular studies: KRASG12C   PD-L1 expression: 99%  PRIOR THERAPY: Status post 1 cycle of systemic chemotherapy with carboplatin for an AUC of 5 and Alimta 500 mg/m and Keytruda 200 mg IV every 3 weeks. First dose 12/30/2022.   CURRENT THERAPY: Starting from cycle #2 on 02/18/23 she has been on single agent Keytruda 200 mg IV every 3 weeks due to her recent stroke and physical debility. She is status post 7 cycles of treatment. Treatment on hold due to recent hospitalization for colitis in July 2024   INTERVAL HISTORY: Patricia [name] 77 y.o. female returns to the clinic today for a follow up visit accompanied by her husband and grandson. The patient was last seen in the clinic on 06/03/23. She has been having some elevated LFTs and diarrhea. She completed a medrol Dosepak and used imodium without improvement in her diarrhea. She called the clinic on 06/22/23 reporting continued diarrhea, lightheadedness, abdominal pain, feeling dehydrated, and dizziness. She was instructed to go to the ER.   She was admitted from 7/14-7/18. Imaging showed concern for colitis. She also had elevated creatinine and low potassium. GI recommended budesonide/entocort EC  for 8 weeks. Her C. Diff testing was negative.  I do not see the results of her GI pathogen panel.  The cecum was biopsied which showed lymphocytic colitis. Her eliquis is on hold due to worsening anemia.  Hemoglobin is normal today.  Denies any abnormal  bleeding or bruising. She is on protonix.  She believes she is scheduled to follow-up with GI in [place] on 07/25/2023.  She saw her PCP a few days ago who started her on Zoloft.  He is also been endorsing worsening headaches.  She felt like her prescriptions sent by GI were causing worsening headaches.  She reports she is on Topamax for headaches.  She states that she does not have a neurologist.  She was hospitalized in February 2024 for stroke and she states she did not have hospital follow-up with neurology.  She is also been struggling with vertigo which got worse after having strokes.  She did complete physical therapy.  However her vertigo has been worsening recently.  Of note the patient had a CT scan of the head without contrast in the hospital which was unremarkable for any acute findings.  She denies any speech changes or extremity weakness.    Since being discharged, she is feeling fair.  Her nausea, vomiting, and diarrhea has completely resolved at this time.  Denies fevers, chills, or night sweats.  He gained a few pounds since last being seen.  She does feel like her breathing is "a little bit worse".  She also reports she has been coughing more.  She uses Lawyer without any improvement.  She coughs up mucus.  Denies any chest pain or hemoptysis.  She is here for evaluation and repeat blood work and for a more detailed discussion about her current condition and recommenced treatment options.   MEDICAL HISTORY: Past Medical History:  Diagnosis Date   Depression  GERD (gastroesophageal reflux disease)    Hypercholesterolemia    IFG (impaired fasting glucose)    Nonintractable episodic headache 01/18/2023   Osteopenia    PONV (postoperative nausea and vomiting)    Stroke (HCC)     ALLERGIES:  is allergic to codeine.  MEDICATIONS:  Current Outpatient Medications  Medication Sig Dispense Refill   HYDROcodone bit-homatropine (HYCODAN) 5-1.5 MG/5ML syrup Take 5 mLs by  mouth every 6 (six) hours as needed for cough. 120 mL 0   ALPRAZolam (XANAX) 0.5 MG tablet Take 1 tablet (0.5 mg total) by mouth at bedtime as needed for anxiety or sleep. TAKE (1) TABLET BY MOUTH AT BEDTIME AS NEEDED FOR SLEEP. 30 tablet 5   apixaban (ELIQUIS) 5 MG TABS tablet Take 1 tablet (5 mg total) by mouth 2 (two) times daily. 60 tablet 6   benzonatate (TESSALON) 100 MG capsule TAKE 1 CAPSULE (100 MG TOTAL) BY MOUTH THREE TIMES DAILY. 90 capsule 2   budesonide (ENTOCORT EC) 3 MG 24 hr capsule Take 3 capsules (9 mg total) by mouth daily. 168 capsule 0   butalbital-acetaminophen-caffeine (FIORICET) 50-325-40 MG tablet TAKE 1/2 TABLET BY MOUTH EVERY 6 (SIX) HOURS AS NEEDED FOR HEADACHE. 30 tablet 2   famotidine (PEPCID) 40 MG tablet Take 1 tablet (40 mg total) by mouth daily. 30 tablet 5   levothyroxine (SYNTHROID) 50 MCG tablet TAKE 1 TABLET BY MOUTH DAILY BEFORE BREAKFAST 90 tablet 1   Menthol-Methyl Salicylate (MUSCLE RUB) 10-15 % CREA Apply 1 Application topically 3 (three) times daily to bilateral calves 85 g 0   Multiple Vitamin (MULTIVITAMIN WITH MINERALS) TABS tablet Take 1 tablet by mouth daily.     ondansetron (ZOFRAN) 8 MG tablet Take 1 tablet (8 mg total) by mouth every 8 (eight) hours as needed for nausea or vomiting. 30 tablet 2   pantoprazole (PROTONIX) 40 MG tablet Take 1 tablet (40 mg total) by mouth daily. 30 tablet 1   potassium chloride SA (KLOR-CON M20) 20 MEQ tablet Take 2 tablets (40 mEq total) by mouth 2 (two) times daily. 270 tablet 1   prochlorperazine (COMPAZINE) 10 MG tablet Take 1 tablet (10 mg total) by mouth every 6 (six) hours as needed for nausea or vomiting. 30 tablet 0   rosuvastatin (CRESTOR) 20 MG tablet Take 1 tablet (20 mg total) by mouth daily. 30 tablet 6   sertraline (ZOLOFT) 25 MG tablet Take 1 tablet (25 mg total) by mouth daily. 30 tablet 3   topiramate (TOPAMAX) 100 MG tablet Take 1 tablet (100 mg total) by mouth 2 (two) times daily. 60 tablet 6    No current facility-administered medications for this visit.    SURGICAL HISTORY:  Past Surgical History:  Procedure Laterality Date   ABDOMINAL HYSTERECTOMY     BIOPSY  06/24/2023   Procedure: BIOPSY;  Surgeon: Dolores Frame, MD;  Location: [facility];  Service: Gastroenterology;;   CATARACT EXTRACTION W/PHACO  09/12/2011   Procedure: CATARACT EXTRACTION PHACO AND INTRAOCULAR LENS PLACEMENT (IOC);  Surgeon: Gemma Payor;  Location: [facility];  Service: Ophthalmology;  Laterality: Left;  CDE: 8.91   CATARACT EXTRACTION W/PHACO  11/18/2011   Procedure: CATARACT EXTRACTION PHACO AND INTRAOCULAR LENS PLACEMENT (IOC);  Surgeon: Gemma Payor;  Location: [facility];  Service: Ophthalmology;  Laterality: Right;  CDE:10.26   COLONOSCOPY  2007   Dr. Jena Gauss: internal hemorrhoids, single anal papilla poor prep.    COLONOSCOPY N/A 03/27/2017   Rourk: Normal exam.   COLONOSCOPY  06/24/2023  Procedure: COLONOSCOPY;  Surgeon: Marguerita Merles, Reuel Boom, MD;  Location: [facility];  Service: Gastroenterology;;   ECTOPIC PREGNANCY SURGERY     removal of right tube   ESOPHAGOGASTRODUODENOSCOPY (EGD) WITH PROPOFOL N/A 09/10/2021   Procedure: ESOPHAGOGASTRODUODENOSCOPY (EGD) WITH PROPOFOL;  Surgeon: Corbin Ade, MD;  Location: [facility];  Service: Endoscopy;  Laterality: N/A;  8:15AM   MALONEY DILATION N/A 09/10/2021   Procedure: Elease Hashimoto DILATION;  Surgeon: Corbin Ade, MD;  Location: [facility];  Service: Endoscopy;  Laterality: N/A;   OVARIAN CYST REMOVAL     right   RECTOCELE REPAIR N/A 01/18/2014   Procedure: POSTERIOR REPAIR (RECTOCELE);  Surgeon: Tilda Burrow, MD;  Location: [facility];  Service: Gynecology;  Laterality: N/A;   VAGINAL HYSTERECTOMY N/A 01/18/2014   Procedure: HYSTERECTOMY VAGINAL;  Surgeon: Tilda Burrow, MD;  Location: [facility];  Service: Gynecology;  Laterality: N/A;    REVIEW OF SYSTEMS:   Review of Systems  Constitutional: Positive for fatigue. Negative  for appetite change, chills, fever and unexpected weight change.  HENT: Positive for hoarseness. Negative for mouth sores, nosebleeds, sore throat and trouble swallowing.  Eyes: Negative for eye problems and icterus.  Respiratory: Positive for worsening cough and shortness of breath.  Negative for  hemoptysis and wheezing.   Cardiovascular: Negative for chest pain and leg swelling.  Gastrointestinal: Negative for abdominal pain, constipation, diarrhea, nausea and vomiting.  Genitourinary: Negative for bladder incontinence, difficulty urinating, dysuria, frequency and hematuria.   Musculoskeletal: Negative for back pain, gait problem, neck pain and neck stiffness.  Skin: Negative for itching and rash.  Neurological: Positive for worsening headaches and vertigo.  Negative for  extremity weakness, gait problem,  light-headedness and seizures.  Hematological: Negative for adenopathy. Does not bruise/bleed easily.  Psychiatric/Behavioral: Negative for confusion, depression and sleep disturbance. The patient is not nervous/anxious.     PHYSICAL EXAMINATION:  Blood pressure 110/74, pulse 70, temperature 98.5 F (36.9 C), resp. rate 13, weight 125 lb 1.6 oz (56.7 kg), SpO2 97%.  ECOG PERFORMANCE STATUS: 2 - Symptomatic, <50% confined to bed  Physical Exam  Constitutional: Oriented to person, place, and time and chronically ill appearing and in no distress.  HENT:  Head: Normocephalic and atraumatic.  Mouth/Throat: Positive for hoarseness in the voice. Patient has to whisper to talk. Oropharynx is clear and moist. No oropharyngeal exudate.  Eyes: Conjunctivae are normal. Right eye exhibits no discharge. Left eye exhibits no discharge. No scleral icterus.  Neck: Normal range of motion. Neck supple.  Cardiovascular: Normal rate, regular rhythm, normal heart sounds and intact distal pulses.   Pulmonary/Chest: Effort normal and breath sounds normal. No respiratory distress. No wheezes. No rales.   Abdominal: Soft. Bowel sounds are normal. Exhibits no distension and no mass. There is no tenderness.  Musculoskeletal: Normal range of motion. Exhibits no edema.  Lymphadenopathy:    No cervical adenopathy.  Neurological: Alert and oriented to person, place, and time. Exhibits muscle wasting.  She was examined in the wheelchair.  Skin: Skin is warm and dry. No rash noted. Not diaphoretic. No erythema. No pallor.  Psychiatric: Mood, memory and judgment normal.  Vitals reviewed.  LABORATORY DATA: Lab Results  Component Value Date   WBC 9.0 07/15/2023   HGB 12.9 07/15/2023   HCT 39.8 07/15/2023   MCV 93.4 07/15/2023   PLT 249 07/15/2023      Chemistry      Component Value Date/Time   NA 140 07/15/2023 1035  NA 139 08/02/2022 1112   K 4.8 07/15/2023 1035   CL 111 07/15/2023 1035   CO2 24 07/15/2023 1035   BUN 16 07/15/2023 1035   BUN 16 08/02/2022 1112   CREATININE 0.75 07/15/2023 1035      Component Value Date/Time   CALCIUM 9.3 07/15/2023 1035   ALKPHOS 84 07/15/2023 1035   AST 22 07/15/2023 1035   ALT 24 07/15/2023 1035   BILITOT 0.4 07/15/2023 1035       RADIOGRAPHIC STUDIES:  CT ABDOMEN PELVIS W CONTRAST  Result Date: 06/22/2023 CLINICAL DATA:  Right lower quadrant abdominal pain. History of non-small cell lung cancer. EXAM: CT ABDOMEN AND PELVIS WITH CONTRAST TECHNIQUE: Multidetector CT imaging of the abdomen and pelvis was performed using the standard protocol following bolus administration of intravenous contrast. RADIATION DOSE REDUCTION: This exam was performed according to the departmental dose-optimization program which includes automated exposure control, adjustment of the mA and/or kV according to patient size and/or use of iterative reconstruction technique. CONTRAST:  OMNIPAQUE IOHEXOL 300 MG/ML  SOLN COMPARISON:  CT abdomen and pelvis 05/30/2023.  PET-CT 12/06/2022 FINDINGS: Lower chest: Left lower lobe pulmonary nodule measuring 1 cm appears  unchanged. Hepatobiliary: No focal liver abnormality is seen. No gallstones, gallbladder wall thickening, or biliary dilatation. Pancreas: Unremarkable. No pancreatic ductal dilatation or surrounding inflammatory changes. Spleen: Normal in size without focal abnormality. Adrenals/Urinary Tract: Adrenal glands are unremarkable. Kidneys are normal, without renal calculi, focal lesion, or hydronephrosis. Small left bladder diverticulum is again seen. The bladder is otherwise within normal limits. Stomach/Bowel: Large hiatal is again seen containing the proximal stomach. Stomach is nondilated. Small bowel loops are within normal limits. There is wall thickening of the splenic flexure to the level the rectum without surrounding inflammation. The appendix is within normal limits. There is no pneumatosis, bowel obstruction or free air. Vascular/Lymphatic: Aortic atherosclerosis. No enlarged abdominal or pelvic lymph nodes. Reproductive: Status post hysterectomy. No adnexal masses. Other: No abdominal wall hernia or abnormality. No abdominopelvic ascites. Musculoskeletal: Degenerative changes affect the spine. IMPRESSION: 1. Findings compatible with nonspecific colitis from the splenic flexure to the level of the rectum. No bowel obstruction or free air. 2. Stable large hiatal hernia. 3. Stable 1 cm left lower lobe pulmonary nodule. Aortic Atherosclerosis (ICD10-I70.0). Electronically Signed   By: Darliss Cheney M.D.   On: 06/22/2023 21:46     ASSESSMENT/PLAN:  This is a very pleasant 77 year old Caucasian female diagnosed with stage IV (T1c, N3, M1 C) non-small cell lung cancer, adenocarcinoma.  The patient presented with a left upper lobe metastatic neoplasm in addition to extensive lymphadenopathy involving the neck, left hilar, mediastinal, and upper abdominal lymph nodes with multiple subcutaneous/muscular nodules.  She was diagnosed in January 2024.  Her molecular studies show she is positive for K-ras G12 C which  can be used in the second line setting.  Her PD-L1 expression is 99%    The patient is going to start systemic/palliative chemotherapy and immunotherapy with carboplatin for AUC of 5, Alimta 500 mg/m, Keytruda 200 mg IV every 3 weeks.  Her first cycle of treatment on 12/30/22.  She started on treatment with single agent immunotherapy with Keytruda 200 mg IV starting from cycle #2 due to her recent stroke.  She is status post 7 cycles total.   She was hospitalized in July 2024 for colitis which is likely immunotherapy related.  She is taking Entocort as prescribed by GI and has an upcoming appointment in a few weeks.  The patient was seen with Dr. Arbutus Ped today.  Will arrange for restaging CT scan of the chest given her worsening cough.  Will also arrange for a brain MRI given her worsening headaches, dizziness,, vertigo.  She reports has been occurring since she had her stroke but it has been worse recently.  She initially attributed her symptoms to her new Entocort medication.  She states that she is not established with neurology outpatient and does not know which neurologist she saw when she was admitted to the hospital for stroke.  We will refer her to Dr. Barbaraann Cao to establish care.  Will see the patient back for a follow-up visit early next week to review the repeat scan of the chest and brain MRI and discuss the next steps in her care.  I will send her prescription for Hycodan for cough.  Her hemoglobin is stable.  The hospital discharge note recommended holding Eliquis until cleared by GI.  However she did resume taking this.  She denies any bleeding.  If ever needed, the patient does have K-ras G 12C mutation and she is a candidate for targeted treatment with [place] or Lum[place]ras.  Will decide next week we see her back.  The patient was advised to call immediately if she has any concerning symptoms in the interval. The patient voices understanding of current disease status and treatment  options and is in agreement with the current care plan. All questions were answered. The patient knows to call the clinic with any problems, questions or concerns. We can certainly see the patient much sooner if necessary   Orders Placed This Encounter  Procedures   MR Brain W Wo Contrast    Standing Status:   Future    Standing Expiration Date:   07/14/2024    Order Specific Question:   If indicated for the ordered procedure, I authorize the administration of contrast media per Radiology protocol    Answer:   Yes    Order Specific Question:   What is the patient's sedation requirement?    Answer:   No Sedation    Order Specific Question:   Does the patient have a pacemaker or implanted devices?    Answer:   No    Order Specific Question:   Use SRS Protocol?    Answer:   No    Order Specific Question:   Preferred imaging location?    Answer:   [facility] (table limit - 550 lbs)   CT Chest W Contrast    Standing Status:   Future    Standing Expiration Date:   07/14/2024    Order Specific Question:   If indicated for the ordered procedure, I authorize the administration of contrast media per Radiology protocol    Answer:   Yes    Order Specific Question:   Does the patient have a contrast media/X-ray dye allergy?    Answer:   No    Order Specific Question:   Preferred imaging location?    Answer:   [name]   CBC with Differential ([name] Only)    Standing Status:   Future    Standing Expiration Date:   07/14/2024   CMP ([name] only)    Standing Status:   Future    Standing Expiration Date:   07/14/2024   Amb Referral to Neuro Oncology    Referral Priority:   Routine    Referral Type:   Consultation    Referral Reason:  Specialty Services Required    Number of Visits Requested:   1      L , PA-C 07/15/23  ADDENDUM: Hematology/Oncology Attending: I had a face-to-face encounter with the patient today.  I reviewed her  records, lab and recommended her care plan.  This is a very pleasant 77 years old white female with stage IV non-small cell lung cancer, adenocarcinoma diagnosed in January 2024 with PD-L1 expression of 99% and positive KRAS G12C mutation.  The patient also has a history of a stroke.  She initially started systemic chemotherapy with a combination of chemoimmunotherapy with carboplatin, Alimta and Keytruda but after the stroke she resumed her treatment with single agent Keytruda status post 7 cycles.  She has been tolerating her treatment fairly well but most recently was admitted to the hospital with worsening fatigue and weakness as well as renal insufficiency and hypokalemia.  She has a lot of diarrhea and her imaging studies at that time showed nonspecific colitis.  She is currently on budesonide/Entocort by her gastroenterologist for around 8 weeks. I recommended for the patient to hold her treatment with immunotherapy for now.  We will order repeat CT scan of the chest as well as MRI of the brain to rule out any evidence of metastatic disease. Depending on the scan results we will discuss with the patient the option of resuming her immunotherapy versus consideration of switching to targeted therapy for the actionable KRAS G12C mutation. The patient and her family are in agreement with the current plan. She was advised to call immediately if she has any other concerning symptoms in the interval. The total time spent in the appointment was 30 minutes. Disclaimer: This note was dictated with voice recognition software. Similar sounding words can inadvertently be transcribed and may be missed upon review. Lajuana Matte, MD

## 2023-07-15 ENCOUNTER — Inpatient Hospital Stay: Payer: Medicare HMO | Attending: Internal Medicine

## 2023-07-15 ENCOUNTER — Inpatient Hospital Stay: Payer: Medicare HMO

## 2023-07-15 ENCOUNTER — Ambulatory Visit (HOSPITAL_COMMUNITY)
Admission: RE | Admit: 2023-07-15 | Discharge: 2023-07-15 | Disposition: A | Discharge status: Home / Self Care | Payer: Medicare HMO | Source: Ambulatory Visit | Attending: Physician Assistant | Admitting: Physician Assistant

## 2023-07-15 ENCOUNTER — Other Ambulatory Visit: Payer: Self-pay

## 2023-07-15 ENCOUNTER — Other Ambulatory Visit: Payer: Medicare HMO

## 2023-07-15 ENCOUNTER — Ambulatory Visit (HOSPITAL_COMMUNITY): Admission: RE | Admit: 2023-07-15 | Payer: Medicare HMO | Source: Ambulatory Visit

## 2023-07-15 ENCOUNTER — Inpatient Hospital Stay: Payer: Medicare HMO | Attending: Internal Medicine | Admitting: Physician Assistant

## 2023-07-15 VITALS — BP 110/74 | HR 70 | Temp 98.5°F | Resp 13 | Wt 125.1 lb

## 2023-07-15 DIAGNOSIS — R059 Cough, unspecified: Secondary | ICD-10-CM | POA: Insufficient documentation

## 2023-07-15 DIAGNOSIS — D649 Anemia, unspecified: Secondary | ICD-10-CM | POA: Insufficient documentation

## 2023-07-15 DIAGNOSIS — Z803 Family history of malignant neoplasm of breast: Secondary | ICD-10-CM | POA: Diagnosis not present

## 2023-07-15 DIAGNOSIS — C3492 Malignant neoplasm of unspecified part of left bronchus or lung: Secondary | ICD-10-CM

## 2023-07-15 DIAGNOSIS — Z8673 Personal history of transient ischemic attack (TIA), and cerebral infarction without residual deficits: Secondary | ICD-10-CM | POA: Insufficient documentation

## 2023-07-15 DIAGNOSIS — I69314 Frontal lobe and executive function deficit following cerebral infarction: Secondary | ICD-10-CM | POA: Diagnosis not present

## 2023-07-15 DIAGNOSIS — K529 Noninfective gastroenteritis and colitis, unspecified: Secondary | ICD-10-CM

## 2023-07-15 DIAGNOSIS — R519 Headache, unspecified: Secondary | ICD-10-CM | POA: Insufficient documentation

## 2023-07-15 DIAGNOSIS — R7989 Other specified abnormal findings of blood chemistry: Secondary | ICD-10-CM | POA: Insufficient documentation

## 2023-07-15 DIAGNOSIS — Z79620 Long term (current) use of immunosuppressive biologic: Secondary | ICD-10-CM | POA: Diagnosis not present

## 2023-07-15 DIAGNOSIS — C3412 Malignant neoplasm of upper lobe, left bronchus or lung: Secondary | ICD-10-CM | POA: Diagnosis not present

## 2023-07-15 DIAGNOSIS — Z5112 Encounter for antineoplastic immunotherapy: Secondary | ICD-10-CM | POA: Insufficient documentation

## 2023-07-15 DIAGNOSIS — Z8041 Family history of malignant neoplasm of ovary: Secondary | ICD-10-CM | POA: Diagnosis not present

## 2023-07-15 DIAGNOSIS — I6782 Cerebral ischemia: Secondary | ICD-10-CM | POA: Diagnosis not present

## 2023-07-15 DIAGNOSIS — R42 Dizziness and giddiness: Secondary | ICD-10-CM | POA: Insufficient documentation

## 2023-07-15 DIAGNOSIS — Z87891 Personal history of nicotine dependence: Secondary | ICD-10-CM | POA: Insufficient documentation

## 2023-07-15 LAB — CMP (CANCER CENTER ONLY)
ALT: 24 U/L (ref 0–44)
AST: 22 U/L (ref 15–41)
Albumin: 4 g/dL (ref 3.5–5.0)
Alkaline Phosphatase: 84 U/L (ref 38–126)
Anion gap: 5 (ref 5–15)
BUN: 16 mg/dL (ref 8–23)
CO2: 24 mmol/L (ref 22–32)
Calcium: 9.3 mg/dL (ref 8.9–10.3)
Chloride: 111 mmol/L (ref 98–111)
Creatinine: 0.75 mg/dL (ref 0.44–1.00)
GFR, Estimated: >60 mL/min (ref >60)
Glucose, Bld: 100 mg/dL — ABNORMAL HIGH (ref 70–99)
Potassium: 4.8 mmol/L (ref 3.5–5.1)
Sodium: 140 mmol/L (ref 135–145)
Total Bilirubin: 0.4 mg/dL (ref 0.3–1.2)
Total Protein: 6.6 g/dL (ref 6.5–8.1)

## 2023-07-15 LAB — CBC WITH DIFFERENTIAL (CANCER CENTER ONLY)
Abs Immature Granulocytes: 0.03 10*3/uL (ref 0.00–0.07)
Basophils Absolute: 0.1 10*3/uL (ref 0.0–0.1)
Basophils Relative: 1 %
Eosinophils Absolute: 0.1 10*3/uL (ref 0.0–0.5)
Eosinophils Relative: 1 %
HCT: 39.8 % (ref 36.0–46.0)
Hemoglobin: 12.9 g/dL (ref 12.0–15.0)
Immature Granulocytes: 0 %
Lymphocytes Relative: 32 %
Lymphs Abs: 2.8 10*3/uL (ref 0.7–4.0)
MCH: 30.3 pg (ref 26.0–34.0)
MCHC: 32.4 g/dL (ref 30.0–36.0)
MCV: 93.4 fL (ref 80.0–100.0)
Monocytes Absolute: 0.6 10*3/uL (ref 0.1–1.0)
Monocytes Relative: 6 %
Neutro Abs: 5.4 10*3/uL (ref 1.7–7.7)
Neutrophils Relative %: 60 %
Platelet Count: 249 10*3/uL (ref 150–400)
RBC: 4.26 MIL/uL (ref 3.87–5.11)
RDW: 13.6 % (ref 11.5–15.5)
WBC Count: 9 10*3/uL (ref 4.0–10.5)
nRBC: 0 % (ref 0.0–0.2)

## 2023-07-15 LAB — TSH: TSH: 16.416 u[IU]/mL — ABNORMAL HIGH (ref 0.350–4.500)

## 2023-07-15 MED ORDER — HYDROCODONE BIT-HOMATROP MBR 5-1.5 MG/5ML PO SOLN
5.0000 mL | Freq: Four times a day (QID) | ORAL | 0 refills | Status: DC | PRN
Start: 2023-07-15 — End: 2023-07-22

## 2023-07-15 MED ORDER — GADOBUTROL 1 MMOL/ML IV SOLN
5.0000 mL | Freq: Once | INTRAVENOUS | Status: AC | PRN
  Administered 2023-07-15: 5 mL via INTRAVENOUS

## 2023-07-15 NOTE — Progress Notes (Signed)
Patient does not have PICC

## 2023-07-19 NOTE — Progress Notes (Unsigned)
[facility] OFFICE PROGRESS NOTE  [name], MD [address] [place] [address]  DIAGNOSIS: Stage IV (T1c, N3, M1 C) non-small cell lung cancer, adenocarcinoma presented with left upper lobe metastatic neoplasm presented with left lower lobe lung nodule in addition to extensive lymphadenopathy involving the neck, left hilar and mediastinal lymphadenopathy as well as upper abdomen lymph nodes and multiple subcutaneous/muscular nodules diagnosed in January 2024.   Molecular studies: KRASG12C   PD-L1 expression: 99%  PRIOR THERAPY: Status post 1 cycle of systemic chemotherapy with carboplatin for an AUC of 5 and Alimta 500 mg/m and Keytruda 200 mg IV every 3 weeks. First dose 12/30/2022.   CURRENT THERAPY: Starting from cycle #2 on 02/18/23 she has been on single agent Keytruda 200 mg IV every 3 weeks due to her recent stroke and physical debility. She is status post 7 cycles of treatment. Treatment on hold due to recent hospitalization for colitis in July 2024   INTERVAL HISTORY: Patricia [name] 77 y.o. female returns  to the clinic today for a follow up visit accompanied by her husband and grandson. She was last seen last week. She was recently hospitalized for colitis. Her diarrhea had improved. Last week, she was endorsing worsening headaches and dizziness. She felt like her prescriptions sent by GI were causing worsening headaches.  She reports she is on Topamax for headaches.  She states that she does not have a neurologist.  She was hospitalized in February 2024 for stroke and she states she did not have hospital follow-up with neurology.  She is also been struggling with vertigo which got worse after having strokes. She did complete physical therapy.  However her vertigo has been worsening recently.  Of note the patient had a CT scan of the head without contrast in the hospital which was unremarkable for any acute findings.  She denies any speech changes or  extremity weakness.   She was referred to see Dr. Barbaraann Cao who she is scheduled to see next week on 07/28/2023  She was also endorsing worsening cough last week which is worse when she talks.  Her cough is dry.  She was previously on Tessalon without any significant improvement.  She is unable to take Hycodan due to codeine allergy.  Since being seen last week, she is feeling better.  She reports her vertigo is improving.  She still continues to have the dry cough.  Denies any fever, chills, or night sweats.  Appetite is stable.  She only had 1 episode of diarrhea in the interval since last being seen.  Denies any nausea or vomiting.  She takes Protonix for GERD.  She is here today for evaluation and repeat blood work and for more detailed discussion about her current condition and recommended treatment options.   MEDICAL HISTORY: Past Medical History:  Diagnosis Date   Depression    GERD (gastroesophageal reflux disease)    Hypercholesterolemia    IFG (impaired fasting glucose)    Nonintractable episodic headache 01/18/2023   Osteopenia    PONV (postoperative nausea and vomiting)    Stroke (HCC)     ALLERGIES:  is allergic to codeine.  MEDICATIONS:  Current Outpatient Medications  Medication Sig Dispense Refill   ALPRAZolam (XANAX) 0.5 MG tablet Take 1 tablet (0.5 mg total) by mouth at bedtime as needed for anxiety or sleep. TAKE (1) TABLET BY MOUTH AT BEDTIME AS NEEDED FOR SLEEP. 30 tablet 5   apixaban (ELIQUIS) 5 MG TABS  tablet Take 1 tablet (5 mg total) by mouth 2 (two) times daily. 60 tablet 6   benzonatate (TESSALON) 100 MG capsule TAKE 1 CAPSULE (100 MG TOTAL) BY MOUTH 3 TIMES A DAY 90 capsule 2   budesonide (ENTOCORT EC) 3 MG 24 hr capsule Take 3 capsules (9 mg total) by mouth daily. 168 capsule 0   butalbital-acetaminophen-caffeine (FIORICET) 50-325-40 MG tablet TAKE 1/2 TABLET BY MOUTH EVERY 6 (SIX) HOURS AS NEEDED FOR HEADACHE. 30 tablet 2   famotidine (PEPCID) 40 MG tablet  Take 1 tablet (40 mg total) by mouth daily. 30 tablet 5   levothyroxine (SYNTHROID) 50 MCG tablet TAKE 1 TABLET BY MOUTH DAILY BEFORE BREAKFAST 90 tablet 1   Menthol-Methyl Salicylate (MUSCLE RUB) 10-15 % CREA Apply 1 Application topically 3 (three) times daily to bilateral calves 85 g 0   Multiple Vitamin (MULTIVITAMIN WITH MINERALS) TABS tablet Take 1 tablet by mouth daily.     ondansetron (ZOFRAN) 8 MG tablet Take 1 tablet (8 mg total) by mouth every 8 (eight) hours as needed for nausea or vomiting. 30 tablet 2   pantoprazole (PROTONIX) 40 MG tablet Take 1 tablet (40 mg total) by mouth daily. 30 tablet 1   potassium chloride SA (KLOR-CON M20) 20 MEQ tablet Take 2 tablets (40 mEq total) by mouth 2 (two) times daily. 270 tablet 1   prochlorperazine (COMPAZINE) 10 MG tablet Take 1 tablet (10 mg total) by mouth every 6 (six) hours as needed for nausea or vomiting. 30 tablet 0   rosuvastatin (CRESTOR) 20 MG tablet Take 1 tablet (20 mg total) by mouth daily. 30 tablet 6   sertraline (ZOLOFT) 25 MG tablet Take 1 tablet (25 mg total) by mouth daily. 30 tablet 3   topiramate (TOPAMAX) 100 MG tablet Take 1 tablet (100 mg total) by mouth 2 (two) times daily. 60 tablet 6   No current facility-administered medications for this visit.    SURGICAL HISTORY:  Past Surgical History:  Procedure Laterality Date   ABDOMINAL HYSTERECTOMY     BIOPSY  06/24/2023   Procedure: BIOPSY;  Surgeon: Dolores Frame, MD;  Location: [facility];  Service: Gastroenterology;;   CATARACT EXTRACTION W/PHACO  09/12/2011   Procedure: CATARACT EXTRACTION PHACO AND INTRAOCULAR LENS PLACEMENT (IOC);  Surgeon: Gemma Payor;  Location: [facility];  Service: Ophthalmology;  Laterality: Left;  CDE: 8.91   CATARACT EXTRACTION W/PHACO  11/18/2011   Procedure: CATARACT EXTRACTION PHACO AND INTRAOCULAR LENS PLACEMENT (IOC);  Surgeon: Gemma Payor;  Location: [facility];  Service: Ophthalmology;  Laterality: Right;  CDE:10.26   COLONOSCOPY   2007   Dr. Jena Gauss: internal hemorrhoids, single anal papilla poor prep.    COLONOSCOPY N/A 03/27/2017   Rourk: Normal exam.   COLONOSCOPY  06/24/2023   Procedure: COLONOSCOPY;  Surgeon: Marguerita Merles, Reuel Boom, MD;  Location: [facility];  Service: Gastroenterology;;   ECTOPIC PREGNANCY SURGERY     removal of right tube   ESOPHAGOGASTRODUODENOSCOPY (EGD) WITH PROPOFOL N/A 09/10/2021   Procedure: ESOPHAGOGASTRODUODENOSCOPY (EGD) WITH PROPOFOL;  Surgeon: Corbin Ade, MD;  Location: [facility];  Service: Endoscopy;  Laterality: N/A;  8:15AM   MALONEY DILATION N/A 09/10/2021   Procedure: Elease Hashimoto DILATION;  Surgeon: Corbin Ade, MD;  Location: [facility];  Service: Endoscopy;  Laterality: N/A;   OVARIAN CYST REMOVAL     right   RECTOCELE REPAIR N/A 01/18/2014   Procedure: POSTERIOR REPAIR (RECTOCELE);  Surgeon: Tilda Burrow, MD;  Location: [facility];  Service: Gynecology;  Laterality: N/A;   VAGINAL HYSTERECTOMY N/A 01/18/2014   Procedure: HYSTERECTOMY VAGINAL;  Surgeon: Tilda Burrow, MD;  Location: [facility];  Service: Gynecology;  Laterality: N/A;    REVIEW OF SYSTEMS:   Review of Systems  Constitutional: Positive for fatigue. Negative for appetite change, chills, fever and unexpected weight change.  HENT: Positive for hoarseness. Negative for mouth sores, nosebleeds, sore throat and trouble swallowing.  Eyes: Negative for eye problems and icterus.  Respiratory: Positive for worsening cough. Negative for  hemoptysis and wheezing.   Cardiovascular: Negative for chest pain and leg swelling.  Gastrointestinal: Negative for abdominal pain, constipation, diarrhea, nausea and vomiting.  Genitourinary: Negative for bladder incontinence, difficulty urinating, dysuria, frequency and hematuria.   Musculoskeletal: Negative for back pain, gait problem, neck pain and neck stiffness.  Skin: Negative for itching and rash.  Neurological: Positive for improving headaches and vertigo.   Negative for  extremity weakness, gait problem,  light-headedness and seizures.  Hematological: Negative for adenopathy. Does not bruise/bleed easily.  Psychiatric/Behavioral: Negative for confusion, depression and sleep disturbance. The patient is not nervous/anxious.   PHYSICAL EXAMINATION:  Blood pressure 120/78, pulse 64, temperature 97.7 F (36.5 C), temperature source Temporal, resp. rate 13, weight 124 lb 8 oz (56.5 kg), SpO2 100%.  ECOG PERFORMANCE STATUS: 2  Physical Exam  Constitutional: Oriented to person, place, and time and chronically ill appearing and in no distress.  HENT:  Head: Normocephalic and atraumatic.  Mouth/Throat: Positive for hoarseness in the voice. Patient has to whisper to talk. Oropharynx is clear and moist. No oropharyngeal exudate.  Eyes: Conjunctivae are normal. Right eye exhibits no discharge. Left eye exhibits no discharge. No scleral icterus.  Neck: Normal range of motion. Neck supple.  Cardiovascular: Normal rate, regular rhythm, normal heart sounds and intact distal pulses.   Pulmonary/Chest: Effort normal and breath sounds normal. No respiratory distress. No wheezes. No rales.  Abdominal: Soft. Bowel sounds are normal. Exhibits no distension and no mass. There is no tenderness.  Musculoskeletal: Normal range of motion. Exhibits no edema.  Lymphadenopathy:    No cervical adenopathy.  Neurological: Alert and oriented to person, place, and time. Exhibits muscle wasting.  She was examined in the wheelchair.  Skin: Skin is warm and dry. No rash noted. Not diaphoretic. No erythema. No pallor.  Psychiatric: Mood, memory and judgment normal.  Vitals reviewed.  LABORATORY DATA: Lab Results  Component Value Date   WBC 9.2 07/22/2023   HGB 13.4 07/22/2023   HCT 40.9 07/22/2023   MCV 94.0 07/22/2023   PLT 184 07/22/2023      Chemistry      Component Value Date/Time   NA 139 07/22/2023 0859   NA 139 08/02/2022 1112   K 4.7 07/22/2023 0859   CL  114 (H) 07/22/2023 0859   CO2 20 (L) 07/22/2023 0859   BUN 23 07/22/2023 0859   BUN 16 08/02/2022 1112   CREATININE 0.75 07/22/2023 0859      Component Value Date/Time   CALCIUM 9.4 07/22/2023 0859   ALKPHOS 74 07/22/2023 0859   AST 18 07/22/2023 0859   ALT 16 07/22/2023 0859   BILITOT 0.3 07/22/2023 0859       RADIOGRAPHIC STUDIES:  CT Chest W Contrast  Result Date: 07/22/2023 CLINICAL DATA:  Non-small-cell lung cancer. Restaging. * Tracking Code: BO * EXAM: CT CHEST WITH CONTRAST TECHNIQUE: Multidetector CT imaging of the chest was performed during intravenous contrast administration. RADIATION DOSE REDUCTION: This exam was performed  according to the departmental dose-optimization program which includes automated exposure control, adjustment of the mA and/or kV according to patient size and/or use of iterative reconstruction technique. CONTRAST:  75mL OMNIPAQUE IOHEXOL 300 MG/ML  SOLN COMPARISON:  05/30/2023 FINDINGS: Cardiovascular: The heart size is normal. No substantial pericardial effusion. Coronary artery calcification is evident. Mild atherosclerotic calcification is noted in the wall of the thoracic aorta. Mediastinum/Nodes: No mediastinal lymphadenopathy. Amorphous soft tissue noted previously in the pre-vascular space and AP window is similar today. There is no hilar lymphadenopathy. The esophagus has normal imaging features. Moderate hiatal hernia. Lungs/Pleura: Centrilobular and paraseptal emphysema evident. 7 mm posterior right upper lobe ground-glass nodule is unchanged on 55/8. Left lower lobe pulmonary nodule measured previously at 16 x 10 mm is 10 x 8 mm today on image 76/8. Bandlike adjacent parenchymal opacity is compatible with post treatment scarring. No new suspicious pulmonary nodule or mass. No focal airspace consolidation. There is no evidence of pleural effusion. Upper Abdomen: Visualized portion of the upper abdomen is unremarkable. Musculoskeletal: No worrisome lytic  or sclerotic osseous abnormality. IMPRESSION: 1. Interval decrease in size of the left lower lobe pulmonary nodule with adjacent post treatment scarring. 2. Stable 7 mm posterior right upper lobe ground-glass nodule. 3. Stable amorphous soft tissue in the pre-vascular space and AP window. 4. Moderate hiatal hernia. 5. Aortic Atherosclerosis (ICD10-I70.0) and Emphysema (ICD10-J43.9). Electronically Signed   By: Kennith Center M.D.   On: 07/22/2023 08:49   MR Brain W Wo Contrast  Result Date: 07/15/2023 CLINICAL DATA:  Metastatic disease evaluation History of strokes. Worsening veritgo and severe headaches. Assess for etiology EXAM: MRI HEAD WITHOUT AND WITH CONTRAST TECHNIQUE: Multiplanar, multiecho pulse sequences of the brain and surrounding structures were obtained without and with intravenous contrast. CONTRAST:  5mL GADAVIST GADOBUTROL 1 MMOL/ML IV SOLN COMPARISON:  CT head 06/12/23 FINDINGS: Brain: Negative for acute infarct. No hemorrhage. No hydrocephalus. No extra-axial fluid collection. Sequela of mild chronic microvascular ischemic change with chronic infarct in the posterior right frontal lobe. There is likely laminar necrosis associated with the chronic right frontal lobe infarct. No contrast enhancing lesion visualized. Vascular: Normal flow voids. Skull and upper cervical spine: Grade 1 anterolisthesis C3 on C4. Sinuses/Orbits: No middle ear or mastoid effusion. Mild mucosal thickening in the bilateral ethmoid air cells. Bilateral lens replacement. Orbits are otherwise unremarkable. Other: None. IMPRESSION: 1. No acute intracranial process. No evidence of intracranial metastatic disease. 2. Chronic infarct in the posterior right frontal lobe with laminar necrosis. Electronically Signed   By: Lorenza Cambridge M.D.   On: 07/15/2023 16:15   CT ABDOMEN PELVIS W CONTRAST  Result Date: 06/22/2023 CLINICAL DATA:  Right lower quadrant abdominal pain. History of non-small cell lung cancer. EXAM: CT ABDOMEN AND  PELVIS WITH CONTRAST TECHNIQUE: Multidetector CT imaging of the abdomen and pelvis was performed using the standard protocol following bolus administration of intravenous contrast. RADIATION DOSE REDUCTION: This exam was performed according to the departmental dose-optimization program which includes automated exposure control, adjustment of the mA and/or kV according to patient size and/or use of iterative reconstruction technique. CONTRAST:  OMNIPAQUE IOHEXOL 300 MG/ML  SOLN COMPARISON:  CT abdomen and pelvis 05/30/2023.  PET-CT 12/06/2022 FINDINGS: Lower chest: Left lower lobe pulmonary nodule measuring 1 cm appears unchanged. Hepatobiliary: No focal liver abnormality is seen. No gallstones, gallbladder wall thickening, or biliary dilatation. Pancreas: Unremarkable. No pancreatic ductal dilatation or surrounding inflammatory changes. Spleen: Normal in size without focal abnormality. Adrenals/Urinary Tract: Adrenal glands  are unremarkable. Kidneys are normal, without renal calculi, focal lesion, or hydronephrosis. Small left bladder diverticulum is again seen. The bladder is otherwise within normal limits. Stomach/Bowel: Large hiatal is again seen containing the proximal stomach. Stomach is nondilated. Small bowel loops are within normal limits. There is wall thickening of the splenic flexure to the level the rectum without surrounding inflammation. The appendix is within normal limits. There is no pneumatosis, bowel obstruction or free air. Vascular/Lymphatic: Aortic atherosclerosis. No enlarged abdominal or pelvic lymph nodes. Reproductive: Status post hysterectomy. No adnexal masses. Other: No abdominal wall hernia or abnormality. No abdominopelvic ascites. Musculoskeletal: Degenerative changes affect the spine. IMPRESSION: 1. Findings compatible with nonspecific colitis from the splenic flexure to the level of the rectum. No bowel obstruction or free air. 2. Stable large hiatal hernia. 3. Stable 1 cm  left lower lobe pulmonary nodule. Aortic Atherosclerosis (ICD10-I70.0). Electronically Signed   By: Darliss Cheney M.D.   On: 06/22/2023 21:46     ASSESSMENT/PLAN:  This is a very pleasant 77 year old Caucasian female diagnosed with stage IV (T1c, N3, M1 C) non-small cell lung cancer, adenocarcinoma.  The patient presented with a left upper lobe metastatic neoplasm in addition to extensive lymphadenopathy involving the neck, left hilar, mediastinal, and upper abdominal lymph nodes with multiple subcutaneous/muscular nodules.  She was diagnosed in January 2024.  Her molecular studies show she is positive for K-ras G12 C which can be used in the second line setting.  Her PD-L1 expression is 99%     The patient is going to start systemic/palliative chemotherapy and immunotherapy with carboplatin for AUC of 5, Alimta 500 mg/m, Keytruda 200 mg IV every 3 weeks.  Her first cycle of treatment on 12/30/22.  She started on treatment with single agent immunotherapy with Keytruda 200 mg IV starting from cycle #2 due to her recent stroke.  She is status post 7 cycles total.   She was hospitalized in July 2024 for colitis which is likely immunotherapy related. She is taking Entocort as prescribed by GI and has an upcoming appointment in a few weeks.   For her worsening headaches, we arranged repeat brain MRI and CT chest for her worsening breathing.   The patient was seen with Dr. Arbutus Ped today.  Dr. Arbutus Ped personally and independently reviewed the scan and discussed results with the patient today.  The scan showed no acute findings with her brain MRI.  Her CT of her chest is stable.  There is actually some mild improvement in the left lower lobe pulmonary nodule. Dr. Arbutus Ped recommends assuming her treatment with Rande Lawman since she has good disease control and her symptoms have improved  Will arrange for her to resume treatment this week.  However the patient was cautioned to please call us if she develops any  recurring symptoms of colitis and diarrhea.  She is scheduled to see Dr. Barbaraann Cao on 07/28/2023 for history of headaches, vertigo, and strokes, though her vertigo is improving.  The patient received paperwork from her insurance company that they do not have record of her being seen by the doctor since March 2024.  We have wrote a letter detailing all of the dates that she was seen in the office and our contact information if they have any further questions.  Will see her back for follow-up visit in 3 weeks for evaluation repeat blood work before undergoing her next cycle of treatment.  She comes in for treatment this week, she does not need an office visit for repeat  labs since she had blood work performed today  The patient was advised to call immediately if she has any concerning symptoms in the interval. The patient voices understanding of current disease status and treatment options and is in agreement with the current care plan. All questions were answered. The patient knows to call the clinic with any problems, questions or concerns. We can certainly see the patient much sooner if necessary     Orders Placed This Encounter  Procedures   CBC with Differential (Cancer Center Only)    Standing Status:   Future    Standing Expiration Date:   08/28/2024   CMP (Cancer Center only)    Standing Status:   Future    Standing Expiration Date:   08/28/2024   CBC with Differential (Cancer Center Only)    Standing Status:   Future    Standing Expiration Date:   09/18/2024   CMP (Cancer Center only)    Standing Status:   Future    Standing Expiration Date:   09/18/2024   T4    Standing Status:   Future    Standing Expiration Date:   09/18/2024   TSH    Standing Status:   Future    Standing Expiration Date:   09/18/2024   CBC with Differential (Cancer Center Only)    Standing Status:   Future    Standing Expiration Date:   10/09/2024   CMP (Cancer Center only)    Standing Status:   Future     Standing Expiration Date:   10/09/2024   CBC with Differential (Cancer Center Only)    Standing Status:   Future    Standing Expiration Date:   10/30/2024   CMP (Cancer Center only)    Standing Status:   Future    Standing Expiration Date:   10/30/2024   CBC with Differential (Cancer Center Only)    Standing Status:   Future    Standing Expiration Date:   11/20/2024   CMP (Cancer Center only)    Standing Status:   Future    Standing Expiration Date:   11/20/2024   T4    Standing Status:   Future    Standing Expiration Date:   11/20/2024   TSH    Standing Status:   Future    Standing Expiration Date:   11/20/2024   CBC with Differential (Cancer Center Only)    Standing Status:   Future    Standing Expiration Date:   12/11/2024   CMP (Cancer Center only)    Standing Status:   Future    Standing Expiration Date:   12/11/2024      Johnette Abraham , PA-C 07/22/23  ADDENDUM: Hematology/Oncology Attending: I had a face-to-face encounter with the patient today.  I reviewed her record, lab, scan and recommended her care plan.  This is a very pleasant 77 years old white female with stage IV non-small cell lung cancer, adenocarcinoma diagnosed in January 2024 with positive KRAS G12C mutation and PD-L1 expression of 99%.  The patient started systemic therapy initially with carboplatin, Alimta and Keytruda for 1 cycle but this was discontinued after she was diagnosed with a stroke and her treatment was delayed by several weeks.  When she resumed that she resumed it with single agent Keytruda because of her comorbidities status post 7 cycles and she has been tolerating her treatment well except for episodes of diarrhea.  She has been off treatment for few weeks now and her condition has  improved and the diarrhea significantly decreased.  She had repeat CT scan of the chest performed recently.  I personally and independently reviewed the scan and discussed the result with the patient and her  husband.  Her scan showed interval decrease in the size of the left lower lobe pulmonary nodule and a stable 7 mm posterior right upper lobe groundglass nodule as well as stable soft tissue in the prevascular space and AP window. I had a lengthy discussion with the patient and her husband about her current condition and treatment options.  I gave the patient the option of resuming her treatment with immunotherapy and close monitoring but if she has more toxicity with diarrhea again with the immunotherapy, we may consider her for alternative treatment with the KRAS G12C mutation inhibitor. The patient and her husband are in agreement with the current plan and she will resume her treatment on 07/28/2023. The patient will come back for follow-up visit in 4 weeks for evaluation before starting cycle #9. She was advised to call immediately if she has any other concerning symptoms in the interval. The total time spent in the appointment was 30 minutes. Disclaimer: This note was dictated with voice recognition software. Similar sounding words can inadvertently be transcribed and may be missed upon review. Lajuana Matte, MD

## 2023-07-21 ENCOUNTER — Other Ambulatory Visit: Payer: Self-pay | Admitting: Physician Assistant

## 2023-07-21 ENCOUNTER — Other Ambulatory Visit: Payer: Self-pay | Admitting: Internal Medicine

## 2023-07-21 ENCOUNTER — Other Ambulatory Visit: Payer: Self-pay | Admitting: Family Medicine

## 2023-07-21 DIAGNOSIS — C3492 Malignant neoplasm of unspecified part of left bronchus or lung: Secondary | ICD-10-CM

## 2023-07-22 ENCOUNTER — Inpatient Hospital Stay (HOSPITAL_BASED_OUTPATIENT_CLINIC_OR_DEPARTMENT_OTHER): Payer: Medicare HMO | Admitting: Physician Assistant

## 2023-07-22 ENCOUNTER — Other Ambulatory Visit: Payer: Self-pay

## 2023-07-22 ENCOUNTER — Ambulatory Visit (HOSPITAL_COMMUNITY)
Admission: RE | Admit: 2023-07-22 | Discharge: 2023-07-22 | Disposition: A | Discharge status: Home / Self Care | Payer: Medicare HMO | Source: Ambulatory Visit | Attending: Physician Assistant | Admitting: Physician Assistant

## 2023-07-22 ENCOUNTER — Inpatient Hospital Stay: Payer: Medicare HMO

## 2023-07-22 VITALS — BP 120/78 | HR 64 | Temp 97.7°F | Resp 13 | Wt 124.5 lb

## 2023-07-22 DIAGNOSIS — C3490 Malignant neoplasm of unspecified part of unspecified bronchus or lung: Secondary | ICD-10-CM | POA: Diagnosis not present

## 2023-07-22 DIAGNOSIS — R7989 Other specified abnormal findings of blood chemistry: Secondary | ICD-10-CM | POA: Diagnosis not present

## 2023-07-22 DIAGNOSIS — R519 Headache, unspecified: Secondary | ICD-10-CM | POA: Diagnosis not present

## 2023-07-22 DIAGNOSIS — Z803 Family history of malignant neoplasm of breast: Secondary | ICD-10-CM | POA: Diagnosis not present

## 2023-07-22 DIAGNOSIS — R42 Dizziness and giddiness: Secondary | ICD-10-CM | POA: Insufficient documentation

## 2023-07-22 DIAGNOSIS — C3492 Malignant neoplasm of unspecified part of left bronchus or lung: Secondary | ICD-10-CM | POA: Insufficient documentation

## 2023-07-22 DIAGNOSIS — I639 Cerebral infarction, unspecified: Secondary | ICD-10-CM | POA: Diagnosis not present

## 2023-07-22 DIAGNOSIS — D649 Anemia, unspecified: Secondary | ICD-10-CM | POA: Diagnosis not present

## 2023-07-22 DIAGNOSIS — Z5112 Encounter for antineoplastic immunotherapy: Secondary | ICD-10-CM | POA: Diagnosis not present

## 2023-07-22 DIAGNOSIS — Z87891 Personal history of nicotine dependence: Secondary | ICD-10-CM | POA: Diagnosis not present

## 2023-07-22 DIAGNOSIS — Z8673 Personal history of transient ischemic attack (TIA), and cerebral infarction without residual deficits: Secondary | ICD-10-CM | POA: Diagnosis not present

## 2023-07-22 DIAGNOSIS — Z8041 Family history of malignant neoplasm of ovary: Secondary | ICD-10-CM | POA: Diagnosis not present

## 2023-07-22 DIAGNOSIS — R059 Cough, unspecified: Secondary | ICD-10-CM | POA: Insufficient documentation

## 2023-07-22 DIAGNOSIS — Z79620 Long term (current) use of immunosuppressive biologic: Secondary | ICD-10-CM | POA: Diagnosis not present

## 2023-07-22 DIAGNOSIS — C3412 Malignant neoplasm of upper lobe, left bronchus or lung: Secondary | ICD-10-CM | POA: Diagnosis not present

## 2023-07-22 DIAGNOSIS — J432 Centrilobular emphysema: Secondary | ICD-10-CM | POA: Diagnosis not present

## 2023-07-22 DIAGNOSIS — K449 Diaphragmatic hernia without obstruction or gangrene: Secondary | ICD-10-CM | POA: Diagnosis not present

## 2023-07-22 LAB — CBC WITH DIFFERENTIAL (CANCER CENTER ONLY)
Abs Immature Granulocytes: 0.02 10*3/uL (ref 0.00–0.07)
Basophils Absolute: 0.1 10*3/uL (ref 0.0–0.1)
Basophils Relative: 1 %
Eosinophils Absolute: 0.1 10*3/uL (ref 0.0–0.5)
Eosinophils Relative: 1 %
HCT: 40.9 % (ref 36.0–46.0)
Hemoglobin: 13.4 g/dL (ref 12.0–15.0)
Immature Granulocytes: 0 %
Lymphocytes Relative: 31 %
Lymphs Abs: 2.9 10*3/uL (ref 0.7–4.0)
MCH: 30.8 pg (ref 26.0–34.0)
MCHC: 32.8 g/dL (ref 30.0–36.0)
MCV: 94 fL (ref 80.0–100.0)
Monocytes Absolute: 0.6 10*3/uL (ref 0.1–1.0)
Monocytes Relative: 6 %
Neutro Abs: 5.6 10*3/uL (ref 1.7–7.7)
Neutrophils Relative %: 61 %
Platelet Count: 184 10*3/uL (ref 150–400)
RBC: 4.35 MIL/uL (ref 3.87–5.11)
RDW: 13.4 % (ref 11.5–15.5)
WBC Count: 9.2 10*3/uL (ref 4.0–10.5)
nRBC: 0 % (ref 0.0–0.2)

## 2023-07-22 LAB — TSH: TSH: 14.704 u[IU]/mL — ABNORMAL HIGH (ref 0.350–4.500)

## 2023-07-22 LAB — CMP (CANCER CENTER ONLY)
ALT: 16 U/L (ref 0–44)
AST: 18 U/L (ref 15–41)
Albumin: 3.9 g/dL (ref 3.5–5.0)
Alkaline Phosphatase: 74 U/L (ref 38–126)
Anion gap: 5 (ref 5–15)
BUN: 23 mg/dL (ref 8–23)
CO2: 20 mmol/L — ABNORMAL LOW (ref 22–32)
Calcium: 9.4 mg/dL (ref 8.9–10.3)
Chloride: 114 mmol/L — ABNORMAL HIGH (ref 98–111)
Creatinine: 0.75 mg/dL (ref 0.44–1.00)
GFR, Estimated: >60 mL/min (ref >60)
Glucose, Bld: 94 mg/dL (ref 70–99)
Potassium: 4.7 mmol/L (ref 3.5–5.1)
Sodium: 139 mmol/L (ref 135–145)
Total Bilirubin: 0.3 mg/dL (ref 0.3–1.2)
Total Protein: 6.4 g/dL — ABNORMAL LOW (ref 6.5–8.1)

## 2023-07-22 MED ORDER — IOHEXOL 300 MG/ML  SOLN
75.0000 mL | Freq: Once | INTRAMUSCULAR | Status: AC | PRN
  Administered 2023-07-22: 75 mL via INTRAVENOUS

## 2023-07-22 MED ORDER — APIXABAN 5 MG PO TABS
5.0000 mg | ORAL_TABLET | Freq: Two times a day (BID) | ORAL | 6 refills | Status: AC
Start: 2023-07-22 — End: ?

## 2023-07-22 MED ORDER — SODIUM CHLORIDE (PF) 0.9 % IJ SOLN
INTRAMUSCULAR | Status: AC
  Filled 2023-07-22: qty 50

## 2023-07-23 ENCOUNTER — Other Ambulatory Visit: Payer: Self-pay | Admitting: Physician Assistant

## 2023-07-23 NOTE — Telephone Encounter (Signed)
So these medications have some duplicates.  Hydrochlorothiazide would not be indicated when taking valsartan with HCTZ Also recently when she was in the hospital looks like they stopped some of this.  So therefore it is important to go through her medicine list with the patient and please notify me of any changes then we can do her refills

## 2023-07-25 ENCOUNTER — Encounter: Payer: Self-pay | Admitting: Internal Medicine

## 2023-07-25 ENCOUNTER — Ambulatory Visit (INDEPENDENT_AMBULATORY_CARE_PROVIDER_SITE_OTHER): Payer: Medicare HMO | Admitting: Internal Medicine

## 2023-07-25 VITALS — BP 137/80 | HR 65 | Temp 97.4°F | Ht <= 58 in | Wt 124.4 lb

## 2023-07-25 DIAGNOSIS — K529 Noninfective gastroenteritis and colitis, unspecified: Secondary | ICD-10-CM

## 2023-07-25 DIAGNOSIS — K219 Gastro-esophageal reflux disease without esophagitis: Secondary | ICD-10-CM | POA: Diagnosis not present

## 2023-07-25 NOTE — Patient Instructions (Signed)
It was good to see you again today!  Glad Entocort has improved your diarrhea  I recommend you decrease Entocort to  (2) tablets daily beginning tomorrow.  Take 2 daily for 6 weeks; then decrease to 1 tablet daily for the next 6 weeks.  Continue Protonix 40 mg once daily best taken before meals for acid reflux  Return to the office to see me in 12 weeks.  As you decrease the dose of Entocort if diarrhea returns, call me immediately

## 2023-07-25 NOTE — Progress Notes (Unsigned)
Primary Care Physician:  [name], MD Primary Gastroenterologist:  Dr. Jena Gauss  Pre-Procedure History & Physical: HPI:  Patricia [name] is a 77 y.o. female here for follow-up of recent hospitalization for diarrhea.  Several month history of insidiously progressive nonbloody watery diarrhea. Hospitalized last month.  C. difficile negative.  GIP not done.  Colonoscopy revealed mild changes of colitis biopsies consistent with lymphocytic colitis started on Entocort.  Her diarrhea has dramatically improved.  Currently on 9 mg daily.  History of GERD well-controlled on Protonix 40 mg each morning.  Normal esophagus back in 2022.  Pleasant 77 year old lady recently diagnosed with lymphocytic colitis.  She has dramatically improved on Entocort 9 mg daily Past Medical History:  Diagnosis Date   Depression    GERD (gastroesophageal reflux disease)    Hypercholesterolemia    IFG (impaired fasting glucose)    Nonintractable episodic headache 01/18/2023   Osteopenia    PONV (postoperative nausea and vomiting)    Stroke [facility])     Past Surgical History:  Procedure Laterality Date   ABDOMINAL HYSTERECTOMY     BIOPSY  06/24/2023   Procedure: BIOPSY;  Surgeon: Dolores Frame, MD;  Location: [facility];  Service: Gastroenterology;;   CATARACT EXTRACTION W/PHACO  09/12/2011   Procedure: CATARACT EXTRACTION PHACO AND INTRAOCULAR LENS PLACEMENT (IOC);  Surgeon: Gemma Payor;  Location: [facility];  Service: Ophthalmology;  Laterality: Left;  CDE: 8.91   CATARACT EXTRACTION W/PHACO  11/18/2011   Procedure: CATARACT EXTRACTION PHACO AND INTRAOCULAR LENS PLACEMENT (IOC);  Surgeon: Gemma Payor;  Location: [facility];  Service: Ophthalmology;  Laterality: Right;  CDE:10.26   COLONOSCOPY  2007   Dr. Jena Gauss: internal hemorrhoids, single anal papilla poor prep.    COLONOSCOPY N/A 03/27/2017   Viki Carrera: Normal exam.   COLONOSCOPY  06/24/2023   Procedure: COLONOSCOPY;  Surgeon: Marguerita Merles, Reuel Boom,  MD;  Location: [facility];  Service: Gastroenterology;;   ECTOPIC PREGNANCY SURGERY     removal of right tube   ESOPHAGOGASTRODUODENOSCOPY (EGD) WITH PROPOFOL N/A 09/10/2021   Procedure: ESOPHAGOGASTRODUODENOSCOPY (EGD) WITH PROPOFOL;  Surgeon: Corbin Ade, MD;  [facility]: [facility];  Service: Endoscopy;  Laterality: N/A;  8:15AM   MALONEY DILATION N/A 09/10/2021   Procedure: Elease Hashimoto DILATION;  Surgeon: Corbin Ade, MD;  Location: [facility];  Service: Endoscopy;  Laterality: N/A;   OVARIAN CYST REMOVAL     right   RECTOCELE REPAIR N/A 01/18/2014   Procedure: POSTERIOR REPAIR (RECTOCELE);  Surgeon: Tilda Burrow, MD;  Location: [facility];  Service: Gynecology;  Laterality: N/A;   VAGINAL HYSTERECTOMY N/A 01/18/2014   Procedure: HYSTERECTOMY VAGINAL;  Surgeon: Tilda Burrow, MD;  Location: [facility];  Service: Gynecology;  Laterality: N/A;    Prior to Admission medications   Medication Sig Start Date End Date Taking? Authorizing Provider  ALPRAZolam Prudy Feeler) 0.5 MG tablet Take 1 tablet (0.5 mg total) by mouth at bedtime as needed for anxiety or sleep. TAKE (1) TABLET BY MOUTH AT BEDTIME AS NEEDED FOR SLEEP. 02/12/23  Yes Luking, Jonna Coup, MD  apixaban (ELIQUIS) 5 MG TABS tablet Take 1 tablet (5 mg total) by mouth 2 (two) times daily. 07/22/23  Yes Heilingoetter, Cassandra L, PA-C  budesonide (ENTOCORT EC) 3 MG 24 hr capsule Take 3 capsules (9 mg total) by mouth daily. 06/27/23 08/22/23 Yes Vassie Loll, MD  butalbital-acetaminophen-caffeine (FIORICET) 50-325-40 MG tablet TAKE 1/2 TABLET BY MOUTH EVERY 6 (SIX) HOURS AS NEEDED FOR HEADACHE. 04/21/23  Yes [name], MD  famotidine (PEPCID) 40 MG tablet Take 1 tablet (40 mg total) by mouth daily. 07/10/23  Yes [name], MD  levothyroxine (SYNTHROID) 50 MCG tablet TAKE 1 TABLET BY MOUTH DAILY BEFORE BREAKFAST 06/06/23  Yes Si Gaul, MD  Menthol-Methyl Salicylate (MUSCLE RUB) 10-15 % CREA Apply 1 Application topically 3  (three) times daily to bilateral calves 02/04/23  Yes Love, Evlyn Kanner, PA-C  Multiple Vitamin (MULTIVITAMIN WITH MINERALS) TABS tablet Take 1 tablet by mouth daily.   Yes [provider]  ondansetron (ZOFRAN) 8 MG tablet Take 1 tablet (8 mg total) by mouth every 8 (eight) hours as needed for nausea or vomiting. 02/11/23  Yes Heilingoetter, Cassandra L, PA-C  pantoprazole (PROTONIX) 40 MG tablet Take 1 tablet (40 mg total) by mouth daily. 06/27/23  Yes Vassie Loll, MD  potassium chloride SA (KLOR-CON M20) 20 MEQ tablet Take 2 tablets (40 mEq total) by mouth 2 (two) times daily. 06/26/23  Yes Vassie Loll, MD  prochlorperazine (COMPAZINE) 10 MG tablet Take 1 tablet (10 mg total) by mouth every 6 (six) hours as needed for nausea or vomiting. 02/04/23  Yes Love, Evlyn Kanner, PA-C  rosuvastatin (CRESTOR) 20 MG tablet Take 1 tablet (20 mg total) by mouth daily. 02/12/23  Yes [name], MD  sertraline (ZOLOFT) 25 MG tablet Take 1 tablet (25 mg total) by mouth daily. 07/10/23  Yes [name], MD  topiramate (TOPAMAX) 100 MG tablet Take 1 tablet (100 mg total) by mouth 2 (two) times daily. 02/12/23  Yes [name], MD    Allergies as of 07/25/2023 - Review Complete 07/25/2023  Allergen Reaction Noted   Codeine Nausea Only 09/11/2011    Family History  Problem Relation Age of Onset   Osteoporosis Mother    Heart attack Father    Heart attack Brother 56       MI   Breast cancer Maternal Aunt    Breast cancer Paternal Aunt    Cancer Paternal Aunt        ovarian cancer   Anesthesia problems Neg Hx    Hypotension Neg Hx    Malignant hyperthermia Neg Hx    Pseudochol deficiency Neg Hx    Colon cancer Neg Hx    Inflammatory bowel disease Neg Hx     Social History   Socioeconomic History   Marital status: Married    Spouse name: Not on file   Number of children: Not on file   Years of education: Not on file   Highest education level: Not on file  Occupational History   Not on  file  Tobacco Use   Smoking status: Former    Current packs/day: 0.00    Average packs/day: 1 pack/day for 20.0 years (20.0 ttl pk-yrs)    Types: Cigarettes    Start date: 09/10/1964    Quit date: 09/10/1984    Years since quitting: 38.8   Smokeless tobacco: Never  Vaping Use   Vaping status: Never Used  Substance and Sexual Activity   Alcohol use: No   Drug use: No   Sexual activity: Not Currently    Birth control/protection: Post-menopausal  Other Topics Concern   Not on file  Social History Narrative   Not on file   Social Determinants of Health   Financial Resource Strain: Not on file  Food Insecurity: No Food Insecurity (06/30/2023)   Hunger Vital Sign    Worried About Running Out of Food in  the Last Year: Never true    Ran Out of Food in the Last Year: Never true  Transportation Needs: No Transportation Needs (06/23/2023)   PRAPARE - Administrator, Civil Service (Medical): No    Lack of Transportation (Non-Medical): No  Physical Activity: Not on file  Stress: Not on file  Social Connections: Unknown (04/20/2022)   Received from [facility]   Social Network    Social Network: Not on file  Intimate Partner Violence: Not At Risk (06/23/2023)   Humiliation, Afraid, Rape, and Kick questionnaire    Fear of Current or Ex-Partner: No    Emotionally Abused: No    Physically Abused: No    Sexually Abused: No    Review of Systems: See HPI, otherwise negative ROS  Physical Exam: BP 137/80 (BP Location: Right Arm, Patient Position: Sitting, Cuff Size: Normal)   Pulse 65   Temp (!) 97.4 F (36.3 C) (Oral)   Ht 4' 8.5" (1.435 m)   Wt 124 lb 6.4 oz (56.4 kg)   SpO2 98%   BMI 27.40 kg/m  General:   Alert,  Well-developed, well-nourished, pleasant and cooperative in NAD Neck:  Supple; no masses or thyromegaly. No significant cervical adenopathy. Lungs:  Clear throughout to auscultation.   No wheezes, crackles, or rhonchi. No acute  distress. Heart:  Regular rate and rhythm; no murmurs, clicks, rubs,  or gallops. Abdomen: Non-distended, normal bowel sounds.  Soft and nontender without appreciable mass or hepatosplenomegaly.   Impression/Plan: 77 year old lady recently mated to the hospital with nonbloody watery diarrhea.  Biopsies consistent with lymphocytic colitis.  She is improved dramatically with a course of Entocort.  GERD well-controlled on Protonix 40 mg daily.  No alarm symptoms.  Normal esophagus in 2022  Recommendations:  I recommend decrease Entocort to  (2) tablets daily beginning tomorrow.  Take 2 daily for 6 weeks; then decrease to 1 tablet daily for subsequent 6 weeks.  Continue Protonix 40 mg once daily -  best taken before meals for acid reflux  Return to the office in 12 weeks.  Patient to call if she develops recurrent diarrhea on the tapering regimen.       Notice: This dictation was prepared with Dragon dictation along with smaller phrase technology. Any transcriptional errors that result from this process are unintentional and may not be corrected upon review.

## 2023-07-28 ENCOUNTER — Inpatient Hospital Stay: Payer: Medicare HMO

## 2023-07-28 ENCOUNTER — Telehealth: Payer: Self-pay

## 2023-07-28 ENCOUNTER — Inpatient Hospital Stay (HOSPITAL_BASED_OUTPATIENT_CLINIC_OR_DEPARTMENT_OTHER): Payer: Medicare HMO | Admitting: Internal Medicine

## 2023-07-28 VITALS — BP 117/77 | HR 80 | Temp 98.2°F | Resp 18 | Wt 123.4 lb

## 2023-07-28 VITALS — BP 133/79 | HR 71 | Temp 98.0°F | Resp 17 | Wt 121.5 lb

## 2023-07-28 DIAGNOSIS — Z8041 Family history of malignant neoplasm of ovary: Secondary | ICD-10-CM | POA: Diagnosis not present

## 2023-07-28 DIAGNOSIS — C3492 Malignant neoplasm of unspecified part of left bronchus or lung: Secondary | ICD-10-CM

## 2023-07-28 DIAGNOSIS — Z803 Family history of malignant neoplasm of breast: Secondary | ICD-10-CM | POA: Diagnosis not present

## 2023-07-28 DIAGNOSIS — Z79620 Long term (current) use of immunosuppressive biologic: Secondary | ICD-10-CM | POA: Diagnosis not present

## 2023-07-28 DIAGNOSIS — R519 Headache, unspecified: Secondary | ICD-10-CM

## 2023-07-28 DIAGNOSIS — R059 Cough, unspecified: Secondary | ICD-10-CM | POA: Diagnosis not present

## 2023-07-28 DIAGNOSIS — I6340 Cerebral infarction due to embolism of unspecified cerebral artery: Secondary | ICD-10-CM

## 2023-07-28 DIAGNOSIS — Z87891 Personal history of nicotine dependence: Secondary | ICD-10-CM | POA: Diagnosis not present

## 2023-07-28 DIAGNOSIS — R42 Dizziness and giddiness: Secondary | ICD-10-CM | POA: Diagnosis not present

## 2023-07-28 DIAGNOSIS — Z8673 Personal history of transient ischemic attack (TIA), and cerebral infarction without residual deficits: Secondary | ICD-10-CM | POA: Diagnosis not present

## 2023-07-28 DIAGNOSIS — C3412 Malignant neoplasm of upper lobe, left bronchus or lung: Secondary | ICD-10-CM | POA: Diagnosis not present

## 2023-07-28 DIAGNOSIS — R7989 Other specified abnormal findings of blood chemistry: Secondary | ICD-10-CM | POA: Diagnosis not present

## 2023-07-28 DIAGNOSIS — Z5112 Encounter for antineoplastic immunotherapy: Secondary | ICD-10-CM | POA: Diagnosis not present

## 2023-07-28 DIAGNOSIS — D649 Anemia, unspecified: Secondary | ICD-10-CM | POA: Diagnosis not present

## 2023-07-28 MED ORDER — SODIUM CHLORIDE 0.9 % IV SOLN: Freq: Once | INTRAVENOUS | Status: AC

## 2023-07-28 MED ORDER — PREDNISONE 50 MG PO TABS: 50.0000 mg | ORAL_TABLET | Freq: Every day | ORAL | 0 refills | Status: DC

## 2023-07-28 MED ORDER — SODIUM CHLORIDE 0.9 % IV SOLN
200.0000 mg | Freq: Once | INTRAVENOUS | Status: AC
  Administered 2023-07-28: 200 mg via INTRAVENOUS
  Filled 2023-07-28: qty 200

## 2023-07-28 NOTE — Patient Instructions (Signed)

## 2023-07-28 NOTE — Telephone Encounter (Signed)
Pt reduced the Entocort and the diarrhea has returned. Husband states that the pt also started taking manuka honey so she is unsure if it is due to the honey or the decrease in medication. Please advise.

## 2023-07-28 NOTE — Telephone Encounter (Signed)
Called pt husband to discuss pt medications, pt husband aaron explained he will have to call back later when he is at home.

## 2023-07-28 NOTE — Progress Notes (Signed)
[facility] [address]  [place], [place] [address] [phone]   New Patient Evaluation  Date of Service: 07/28/23 Patient Name: Patricia [name] Patient MRN: [id number] Patient DOB: 1946-01-15 Provider: Henreitta Leber, MD  Identifying Statement:  Patricia [name] is a 77 y.o. female with Cerebrovascular accident (CVA) due to embolism of cerebral artery (HCC)  Chronic daily headache who presents for initial consultation and evaluation regarding cancer associated neurologic deficits.    Referring Provider: Heilingoetter, Johnette Abraham, PA-C [address] [place],  [place] [address]  Primary Cancer:  Oncologic History: Oncology History  Non-small cell lung cancer, left (HCC)  12/19/2022 Initial Diagnosis   Adenocarcinoma of left lung, stage 4 (HCC)   12/19/2022 Cancer Staging   Staging form: Lung, AJCC 8th Edition - Clinical: Stage IVB (cT1c, cN3, cM1c) - Signed by Si Gaul, MD on 12/19/2022   12/30/2022 -  Chemotherapy   Patient is on Treatment Plan : LUNG Carboplatin (5) + Pemetrexed (500) + Pembrolizumab (200) D1 q21d Induction x 4 cycles / Maintenance Pemetrexed (500) + Pembrolizumab (200) D1 q21d       History of Present Illness: The patient's records from the referring physician were obtained and reviewed and the patient interviewed to confirm this HPI.  [name] presents for post-stroke evaluation.  She describes improvement in left leg weakness, is walking around the house somewhat on her own.  Still uses wheelchair for longer trips.  Still having headaches "on sides of head" each day.  Dosing Fioricet three times each day over the past ~6 months or so.  No headache history prior to this period.  Sleep has been normal.  No other new or progressive symptoms, continues on Keytruda with Dr. Arbutus Ped.   Medications: Current Outpatient Medications on File Prior to Visit  Medication Sig Dispense Refill    ALPRAZolam (XANAX) 0.5 MG tablet Take 1 tablet (0.5 mg total) by mouth at bedtime as needed for anxiety or sleep. TAKE (1) TABLET BY MOUTH AT BEDTIME AS NEEDED FOR SLEEP. 30 tablet 5   apixaban (ELIQUIS) 5 MG TABS tablet Take 1 tablet (5 mg total) by mouth 2 (two) times daily. 60 tablet 6   budesonide (ENTOCORT EC) 3 MG 24 hr capsule Take 3 capsules (9 mg total) by mouth daily. 168 capsule 0   butalbital-acetaminophen-caffeine (FIORICET) 50-325-40 MG tablet TAKE 1/2 TABLET BY MOUTH EVERY 6 (SIX) HOURS AS NEEDED FOR HEADACHE. 30 tablet 2   famotidine (PEPCID) 40 MG tablet Take 1 tablet (40 mg total) by mouth daily. 30 tablet 5   levothyroxine (SYNTHROID) 50 MCG tablet TAKE 1 TABLET BY MOUTH DAILY BEFORE BREAKFAST 90 tablet 1   Menthol-Methyl Salicylate (MUSCLE RUB) 10-15 % CREA Apply 1 Application topically 3 (three) times daily to bilateral calves 85 g 0   Multiple Vitamin (MULTIVITAMIN WITH MINERALS) TABS tablet Take 1 tablet by mouth daily.     ondansetron (ZOFRAN) 8 MG tablet Take 1 tablet (8 mg total) by mouth every 8 (eight) hours as needed for nausea or vomiting. 30 tablet 2   pantoprazole (PROTONIX) 40 MG tablet Take 1 tablet (40 mg total) by mouth daily. 30 tablet 1   potassium chloride SA (KLOR-CON M20) 20 MEQ tablet Take 2 tablets (40 mEq total) by mouth 2 (two) times daily. 270 tablet 1   prochlorperazine (COMPAZINE) 10 MG tablet Take 1 tablet (10 mg total) by mouth every 6 (six) hours as needed for nausea or  vomiting. 30 tablet 0   rosuvastatin (CRESTOR) 20 MG tablet Take 1 tablet (20 mg total) by mouth daily. 30 tablet 6   sertraline (ZOLOFT) 25 MG tablet Take 1 tablet (25 mg total) by mouth daily. 30 tablet 3   topiramate (TOPAMAX) 100 MG tablet Take 1 tablet (100 mg total) by mouth 2 (two) times daily. 60 tablet 6   No current facility-administered medications on file prior to visit.    Allergies:  Allergies  Allergen Reactions   Codeine Nausea Only   Past Medical History:   Past Medical History:  Diagnosis Date   Depression    GERD (gastroesophageal reflux disease)    Hypercholesterolemia    IFG (impaired fasting glucose)    Nonintractable episodic headache 01/18/2023   Osteopenia    PONV (postoperative nausea and vomiting)    Stroke [facility])    Past Surgical History:  Past Surgical History:  Procedure Laterality Date   ABDOMINAL HYSTERECTOMY     BIOPSY  06/24/2023   Procedure: BIOPSY;  Surgeon: Dolores Frame, MD;  Location: [facility];  Service: Gastroenterology;;   CATARACT EXTRACTION W/PHACO  09/12/2011   Procedure: CATARACT EXTRACTION PHACO AND INTRAOCULAR LENS PLACEMENT (IOC);  Surgeon: Gemma Payor;  Location: [facility] OR[facility];  Service: Ophthalmology;  Laterality: Left;  CDE: 8.91   CATARACT EXTRACTION W/PHACO  11/18/2011   Procedure: CATARACT EXTRACTION PHACO AND INTRAOCULAR LENS PLACEMENT (IOC);  Surgeon: Gemma Payor;  Location: [facility];  Service: Ophthalmology;  Laterality: Right;  CDE:10.26   COLONOSCOPY  2007   Dr. Jena Gauss: internal hemorrhoids, single anal papilla poor prep.    COLONOSCOPY N/A 03/27/2017   Rourk: Normal exam.   COLONOSCOPY  06/24/2023   Procedure: COLONOSCOPY;  Surgeon: Marguerita Merles, Reuel Boom, MD;  Location: [facility];  Service: Gastroenterology;;   ECTOPIC PREGNANCY SURGERY     removal of right tube   ESOPHAGOGASTRODUODENOSCOPY (EGD) WITH PROPOFOL N/A 09/10/2021   Procedure: ESOPHAGOGASTRODUODENOSCOPY (EGD) WITH PROPOFOL;  Surgeon: Corbin Ade, MD;  Location: [facility];  Service: Endoscopy;  Laterality: N/A;  8:15AM   MALONEY DILATION N/A 09/10/2021   Procedure: Elease Hashimoto DILATION;  Surgeon: Corbin Ade, MD;  Location: [facility];  Service: Endoscopy;  Laterality: N/A;   OVARIAN CYST REMOVAL     right   RECTOCELE REPAIR N/A 01/18/2014   Procedure: POSTERIOR REPAIR (RECTOCELE);  Surgeon: Tilda Burrow, MD;  Location: [facility];  Service: Gynecology;  Laterality: N/A;   VAGINAL HYSTERECTOMY N/A 01/18/2014    Procedure: HYSTERECTOMY VAGINAL;  Surgeon: Tilda Burrow, MD;  Location: [facility];  Service: Gynecology;  Laterality: N/A;   Social History:  Social History   Socioeconomic History   Marital status: Married    Spouse name: Not on file   Number of children: Not on file   Years of education: Not on file   Highest education level: Not on file  Occupational History   Not on file  Tobacco Use   Smoking status: Former    Current packs/day: 0.00    Average packs/day: 1 pack/day for 20.0 years (20.0 ttl pk-yrs)    Types: Cigarettes    Start date: 09/10/1964    Quit date: 09/10/1984    Years since quitting: 38.9   Smokeless tobacco: Never  Vaping Use   Vaping status: Never Used  Substance and Sexual Activity   Alcohol use: No   Drug use: No   Sexual activity: Not Currently    Birth control/protection: Post-menopausal  Other Topics  Concern   Not on file  Social History Narrative   Not on file   Social Determinants of Health   Financial Resource Strain: Not on file  Food Insecurity: No Food Insecurity (06/30/2023)   Hunger Vital Sign    Worried About Running Out of Food in the Last Year: Never true    Ran Out of Food in the Last Year: Never true  Transportation Needs: No Transportation Needs (06/23/2023)   PRAPARE - Administrator, Civil Service (Medical): No    Lack of Transportation (Non-Medical): No  Physical Activity: Not on file  Stress: Not on file  Social Connections: Unknown (04/20/2022)   [facility] from [facility]   Social Network    Social Network: Not on file  Intimate Partner Violence: Not At Risk (06/23/2023)   Humiliation, Afraid, Rape, and Kick questionnaire    Fear of Current or Ex-Partner: No    Emotionally Abused: No    Physically Abused: No    Sexually Abused: No   Family History:  Family History  Problem Relation Age of Onset   Osteoporosis Mother    Heart attack Father    Heart attack Brother 6       MI   Breast cancer  Maternal Aunt    Breast cancer Paternal Aunt    Cancer Paternal Aunt        ovarian cancer   Anesthesia problems Neg Hx    Hypotension Neg Hx    Malignant hyperthermia Neg Hx    Pseudochol deficiency Neg Hx    Colon cancer Neg Hx    Inflammatory bowel disease Neg Hx     Review of Systems: Constitutional: Doesn't report fevers, chills or abnormal weight loss Eyes: Doesn't report blurriness of vision Ears, nose, mouth, throat, and face: Doesn't report sore throat Respiratory: Doesn't report cough, dyspnea or wheezes Cardiovascular: Doesn't report palpitation, chest discomfort  Gastrointestinal:  Doesn't report nausea, constipation, diarrhea GU: Doesn't report incontinence Skin: Doesn't report skin rashes Neurological: Per HPI Musculoskeletal: Doesn't report joint pain Behavioral/Psych: Doesn't report anxiety  Physical Exam: Vitals:   07/28/23 1428  BP: 117/77  Pulse: 80  Resp: 18  Temp: 98.2 F (36.8 C)  SpO2: 100%   KPS: 70. General: Hypophonic speech Head: Normal EENT: No conjunctival injection or scleral icterus.  Lungs: Resp effort normal Cardiac: Regular rate Abdomen: Non-distended abdomen Skin: No rashes cyanosis or petechiae. Extremities: No clubbing or edema  Neurologic Exam: Mental Status: Awake, alert, attentive to examiner. Oriented to self and environment. Language is fluent with intact comprehension.  Cranial Nerves: Visual acuity is grossly normal. Visual fields are full. Extra-ocular movements intact. No ptosis. Face is symmetric Motor: Tone and bulk are normal. Power is 4/5 in left leg. Reflexes are symmetric, no pathologic reflexes present.  Sensory: Intact to light touch Gait: Deferred   Labs: I have reviewed the data as listed    Component Value Date/Time   NA 139 07/22/2023 0859   NA 139 08/02/2022 1112   K 4.7 07/22/2023 0859   CL 114 (H) 07/22/2023 0859   CO2 20 (L) 07/22/2023 0859   GLUCOSE 94 07/22/2023 0859   BUN 23 07/22/2023 0859    BUN 16 08/02/2022 1112   CREATININE 0.75 07/22/2023 0859   CALCIUM 9.4 07/22/2023 0859   PROT 6.4 (L) 07/22/2023 0859   PROT 7.0 08/02/2022 1112   ALBUMIN 3.9 07/22/2023 0859   ALBUMIN 4.8 08/02/2022 1112   AST 18 07/22/2023 0859  ALT 16 07/22/2023 0859   ALKPHOS 74 07/22/2023 0859   BILITOT 0.3 07/22/2023 0859   GFRNONAA >60 07/22/2023 0859   GFRAA >90 01/19/2014 0530   Lab Results  Component Value Date   WBC 9.2 07/22/2023   NEUTROABS 5.6 07/22/2023   HGB 13.4 07/22/2023   HCT 40.9 07/22/2023   MCV 94.0 07/22/2023   PLT 184 07/22/2023    Imaging:  CT Chest W Contrast  Result Date: 07/22/2023 CLINICAL DATA:  Non-small-cell lung cancer. Restaging. * Tracking Code: BO * EXAM: CT CHEST WITH CONTRAST TECHNIQUE: Multidetector CT imaging of the chest was performed during intravenous contrast administration. RADIATION DOSE REDUCTION: This exam was performed according to the departmental dose-optimization program which includes automated exposure control, adjustment of the mA and/or kV according to patient size and/or use of iterative reconstruction technique. CONTRAST:  75mL OMNIPAQUE IOHEXOL 300 MG/ML  SOLN COMPARISON:  05/30/2023 FINDINGS: Cardiovascular: The heart size is normal. No substantial pericardial effusion. Coronary artery calcification is evident. Mild atherosclerotic calcification is noted in the wall of the thoracic aorta. Mediastinum/Nodes: No mediastinal lymphadenopathy. Amorphous soft tissue noted previously in the pre-vascular space and AP window is similar today. There is no hilar lymphadenopathy. The esophagus has normal imaging features. Moderate hiatal hernia. Lungs/Pleura: Centrilobular and paraseptal emphysema evident. 7 mm posterior right upper lobe ground-glass nodule is unchanged on 55/8. Left lower lobe pulmonary nodule measured previously at 16 x 10 mm is 10 x 8 mm today on image 76/8. Bandlike adjacent parenchymal opacity is compatible with post treatment  scarring. No new suspicious pulmonary nodule or mass. No focal airspace consolidation. There is no evidence of pleural effusion. Upper Abdomen: Visualized portion of the upper abdomen is unremarkable. Musculoskeletal: No worrisome lytic or sclerotic osseous abnormality. IMPRESSION: 1. Interval decrease in size of the left lower lobe pulmonary nodule with adjacent post treatment scarring. 2. Stable 7 mm posterior right upper lobe ground-glass nodule. 3. Stable amorphous soft tissue in the pre-vascular space and AP window. 4. Moderate hiatal hernia. 5. Aortic Atherosclerosis (ICD10-I70.0) and Emphysema (ICD10-J43.9). Electronically Signed   By: Kennith Center M.D.   On: 07/22/2023 08:49   MR Brain W Wo Contrast  Result Date: 07/15/2023 CLINICAL DATA:  Metastatic disease evaluation History of strokes. Worsening veritgo and severe headaches. Assess for etiology EXAM: MRI HEAD WITHOUT AND WITH CONTRAST TECHNIQUE: Multiplanar, multiecho pulse sequences of the brain and surrounding structures were obtained without and with intravenous contrast. CONTRAST:  5mL GADAVIST GADOBUTROL 1 MMOL/ML IV SOLN COMPARISON:  CT head 06/12/23 FINDINGS: Brain: Negative for acute infarct. No hemorrhage. No hydrocephalus. No extra-axial fluid collection. Sequela of mild chronic microvascular ischemic change with chronic infarct in the posterior right frontal lobe. There is likely laminar necrosis associated with the chronic right frontal lobe infarct. No contrast enhancing lesion visualized. Vascular: Normal flow voids. Skull and upper cervical spine: Grade 1 anterolisthesis C3 on C4. Sinuses/Orbits: No middle ear or mastoid effusion. Mild mucosal thickening in the bilateral ethmoid air cells. Bilateral lens replacement. Orbits are otherwise unremarkable. Other: None. IMPRESSION: 1. No acute intracranial process. No evidence of intracranial metastatic disease. 2. Chronic infarct in the posterior right frontal lobe with laminar necrosis.  Electronically Signed   By: Lorenza Cambridge M.D.   On: 07/15/2023 16:15     Assessment/Plan Cerebrovascular accident (CVA) due to embolism of cerebral artery (HCC)  Chronic daily headache  Chena B Omoto is clinically stable today from CNS/stroke standpoint.    She should continue Eliquis 5mg  BID.  We did recommend she resume Crestor, this was stopped for unknown reason.  Blood pressure well controlled currently.  For headaches, we counseled on medication overuse.  She will try to cut back on Fioricet.  We provided 7 days script for prednisone 50mg  daily to help with the analgesia wean.  For now may continue Topamax 100mg  BID.  We spent twenty additional minutes teaching regarding the natural history, biology, and historical experience in the treatment of neurologic complications of cancer.   We appreciate the opportunity to participate in the care of KORTNI BODNAR.  We'll touch base with her before next infusion in 3 weeks to continue adjusting headache medications.  All questions were answered. The patient knows to call the clinic with any problems, questions or concerns. No barriers to learning were detected.  The total time spent in the encounter was 40 minutes and more than 50% was on counseling and review of test results   Henreitta Leber, MD Medical Director of Neuro-Oncology [facility] 07/28/23 3:26 PM

## 2023-07-28 NOTE — Telephone Encounter (Signed)
Pt was made aware.  

## 2023-07-29 ENCOUNTER — Ambulatory Visit: Payer: Medicare HMO

## 2023-07-29 ENCOUNTER — Other Ambulatory Visit: Payer: Self-pay

## 2023-08-01 ENCOUNTER — Other Ambulatory Visit: Payer: Self-pay | Admitting: Family Medicine

## 2023-08-01 NOTE — Telephone Encounter (Signed)
Therefore please go ahead and discontinue the HCTZ and the valsartan you may give her 6 months refill on the topiramate and as for the butalbital send it to me and I will put in the work renewal  Please notify pharmacy that the HCTZ and valsartan are no longer on her med list and it is inappropriate for them to keep sending [name] refills

## 2023-08-05 ENCOUNTER — Other Ambulatory Visit: Payer: Medicare HMO

## 2023-08-05 ENCOUNTER — Ambulatory Visit: Payer: Medicare HMO

## 2023-08-05 ENCOUNTER — Ambulatory Visit: Payer: Medicare HMO | Admitting: Internal Medicine

## 2023-08-08 ENCOUNTER — Other Ambulatory Visit: Payer: Self-pay

## 2023-08-13 ENCOUNTER — Other Ambulatory Visit: Payer: Self-pay

## 2023-08-14 NOTE — Progress Notes (Signed)
[facility] OFFICE PROGRESS NOTE  Patricia Sciara, MD [address] [place] [address]  DIAGNOSIS: Stage IV (T1c, N3, M1 C) non-small cell lung cancer, adenocarcinoma presented with left upper lobe metastatic neoplasm presented with left lower lobe lung nodule in addition to extensive lymphadenopathy involving the neck, left hilar and mediastinal lymphadenopathy as well as upper abdomen lymph nodes and multiple subcutaneous/muscular nodules diagnosed in January 2024.   Molecular studies: KRASG12C   PD-L1 expression: 99%  PRIOR THERAPY: Status post 1 cycle of systemic chemotherapy with carboplatin for an AUC of 5 and Alimta 500 mg/m and Keytruda 200 mg IV every 3 weeks. First dose 12/30/2022.   CURRENT THERAPY: Starting from cycle #2 on 02/18/23 she has been on single agent Keytruda 200 mg IV every 3 weeks due to her recent stroke and physical debility. She is status post 8 cycles of treatment. Treatment on hold due to recent hospitalization for colitis in July 2024   INTERVAL HISTORY: Patricia [name] 77 y.o. female returns to the clinic today for a follow-up visit accompanied by her daughter.  In summary, the patient was hospitalized for colitis in July 2024.  She was seen back in the clinic on 07/15/2023 and her diarrhea had completely resolved.  Therefore, she was rechallenged with Keytruda at her last appointment.  About a week or so later, she started developing mild diarrhea daily.  She states that is less than 5/day.  She states it depends on what she eats.  If she takes Imodium, it does control her diarrhea completely.  She will take 2 tablets of Imodium after a loose stool and 1 tablet thereon after if needed.  She states that she has on average 2-4 loose stools per day.     She sees Dr. Jena Gauss from GI and is going to be on Entocort for 12 weeks.  She also mention she sees her PCP tomorrow.  She mentions that her PCP has her on potassium supplements but she has  difficulty swallowing this.  I encouraged her to talk to them about possible liquid potassium.  She reports that the potassium is taken in pudding which sometimes flares up her diarrhea.  She denies any fever, chills, or night sweats.  She lost 2 more pounds.  Her breathing is stable.  She reports stable dyspnea on exertion.  Denies any changes with cough.  She did not have significant improvement with Delsym, Robitussin, or Tessalon.  She has an allergy to codeine.  She sometimes cough secondary to sinus drainage.  She denies any nausea, vomiting, or diarrhea. She takes Protonix for GERD. She sees Dr. Barbaraann Cao for ongoing headaches and dizziness.  She is scheduled to see him today before infusion.  She is here today for evaluation and repeat blood work before undergoing her next cycle of treatment.   MEDICAL HISTORY: Past Medical History:  Diagnosis Date   Depression    GERD (gastroesophageal reflux disease)    Hypercholesterolemia    IFG (impaired fasting glucose)    Nonintractable episodic headache 01/18/2023   Osteopenia    PONV (postoperative nausea and vomiting)    Stroke (HCC)     ALLERGIES:  is allergic to codeine.  MEDICATIONS:  Current Outpatient Medications  Medication Sig Dispense Refill   ALPRAZolam (XANAX) 0.5 MG tablet Take 1 tablet (0.5 mg total) by mouth at bedtime as needed for anxiety or sleep. TAKE (1) TABLET BY MOUTH AT BEDTIME AS NEEDED FOR SLEEP. 30 tablet 5  apixaban (ELIQUIS) 5 MG TABS tablet Take 1 tablet (5 mg total) by mouth 2 (two) times daily. 60 tablet 6   budesonide (ENTOCORT EC) 3 MG 24 hr capsule Take 3 capsules (9 mg total) by mouth daily. 168 capsule 0   butalbital-acetaminophen-caffeine (FIORICET) 50-325-40 MG tablet TAKE 1/2 TABLET BY MOUTH EVERY 6 (SIX) HOURS AS NEEDED FOR HEADACHE. 30 tablet 2   famotidine (PEPCID) 40 MG tablet Take 1 tablet (40 mg total) by mouth daily. 30 tablet 5   levothyroxine (SYNTHROID) 50 MCG tablet TAKE 1 TABLET BY MOUTH  DAILY BEFORE BREAKFAST 90 tablet 1   Menthol-Methyl Salicylate (MUSCLE RUB) 10-15 % CREA Apply 1 Application topically 3 (three) times daily to bilateral calves 85 g 0   Multiple Vitamin (MULTIVITAMIN WITH MINERALS) TABS tablet Take 1 tablet by mouth daily.     ondansetron (ZOFRAN) 8 MG tablet Take 1 tablet (8 mg total) by mouth every 8 (eight) hours as needed for nausea or vomiting. 30 tablet 2   pantoprazole (PROTONIX) 40 MG tablet Take 1 tablet (40 mg total) by mouth daily. 30 tablet 1   potassium chloride SA (KLOR-CON M20) 20 MEQ tablet Take 2 tablets (40 mEq total) by mouth 2 (two) times daily. 270 tablet 1   predniSONE (DELTASONE) 50 MG tablet Take 1 tablet (50 mg total) by mouth daily with breakfast. 7 tablet 0   prochlorperazine (COMPAZINE) 10 MG tablet Take 1 tablet (10 mg total) by mouth every 6 (six) hours as needed for nausea or vomiting. 30 tablet 0   rosuvastatin (CRESTOR) 20 MG tablet Take 1 tablet (20 mg total) by mouth daily. 30 tablet 6   sertraline (ZOLOFT) 25 MG tablet TAKE 1 TABLET (25 MG TOTAL) BY MOUTH DAILY. 90 tablet 2   topiramate (TOPAMAX) 100 MG tablet TAKE 1 TABLET BY MOUTH TWICE A DAY 180 tablet 2   No current facility-administered medications for this visit.    SURGICAL HISTORY:  Past Surgical History:  Procedure Laterality Date   ABDOMINAL HYSTERECTOMY     BIOPSY  06/24/2023   Procedure: BIOPSY;  Surgeon: Dolores Frame, MD;  Location: [facility];  Service: Gastroenterology;;   CATARACT EXTRACTION W/PHACO  09/12/2011   Procedure: CATARACT EXTRACTION PHACO AND INTRAOCULAR LENS PLACEMENT (IOC);  Surgeon: Gemma Payor;  Location: [facility];  Service: Ophthalmology;  Laterality: Left;  CDE: 8.91   CATARACT EXTRACTION W/PHACO  11/18/2011   Procedure: CATARACT EXTRACTION PHACO AND INTRAOCULAR LENS PLACEMENT (IOC);  Surgeon: Gemma Payor;  Location: [facility];  Service: Ophthalmology;  Laterality: Right;  CDE:10.26   COLONOSCOPY  2007   Dr. Jena Gauss: internal  hemorrhoids, single anal papilla poor prep.    COLONOSCOPY N/A 03/27/2017   Rourk: Normal exam.   COLONOSCOPY  06/24/2023   Procedure: COLONOSCOPY;  Surgeon: Marguerita Merles, Reuel Boom, MD;  Location: [facility];  Service: Gastroenterology;;   ECTOPIC PREGNANCY SURGERY     removal of right tube   ESOPHAGOGASTRODUODENOSCOPY (EGD) WITH PROPOFOL N/A 09/10/2021   Procedure: ESOPHAGOGASTRODUODENOSCOPY (EGD) WITH PROPOFOL;  Surgeon: Corbin Ade, MD;  Location: [facility];  Service: Endoscopy;  Laterality: N/A;  8:15AM   MALONEY DILATION N/A 09/10/2021   Procedure: Elease Hashimoto DILATION;  Surgeon: Corbin Ade, MD;  Location: [facility];  Service: Endoscopy;  Laterality: N/A;   OVARIAN CYST REMOVAL     right   RECTOCELE REPAIR N/A 01/18/2014   Procedure: POSTERIOR REPAIR (RECTOCELE);  Surgeon: Tilda Burrow, MD;  Location: [facility];  Service: Gynecology;  Laterality: N/A;   VAGINAL HYSTERECTOMY N/A 01/18/2014   Procedure: HYSTERECTOMY VAGINAL;  Surgeon: Tilda Burrow, MD;  Location: [facility];  Service: Gynecology;  Laterality: N/A;    REVIEW OF SYSTEMS:   Constitutional: Positive stable fatigue. Positive for weight loss.  Negative for chills and fever.  HENT: Positive for stable hoarseness. Negative for mouth sores, nosebleeds, sore throat and trouble swallowing.   Eyes: Positive for double vision left eye. Negative for icterus.  Respiratory: Positive for mild intermittent dry cough. Stable dyspnea on exertion. Negative for hemoptysis and wheezing.   Cardiovascular: Negative for chest pain and leg swelling.  Gastrointestinal: Positive for diarrhea.  Negative for constipation, nausea and vomiting.  Genitourinary: Negative for bladder incontinence, difficulty urinating, dysuria, frequency and hematuria.   Musculoskeletal: Positive for gait changes due to hx of stroke. Negative for back pain, neck pain and neck stiffness.  Skin: Negative for itching and rash.  Neurological: Positive for  lower extremity weakness in left leg due to history of stroke. Positive for occasional headaches and lightheadedness/dizziness. Negative for seizures.  Hematological: Negative for adenopathy. Does not bruise/bleed easily.  Psychiatric/Behavioral: Negative for confusion, depression and sleep disturbance. The patient is not nervous/anxious   PHYSICAL EXAMINATION:  There were no vitals taken for this visit.  ECOG PERFORMANCE STATUS: 1  Physical Exam  Constitutional: Oriented to person, place, and time and well-developed, well-nourished, and in no distress.  HENT:  Head: Normocephalic and atraumatic.  Mouth/Throat: Positive for hoarseness in the voice. Patient has to whisper to talk. Oropharynx is clear and moist. No oropharyngeal exudate.  Eyes: Conjunctivae are normal. Right eye exhibits no discharge. Left eye exhibits no discharge. No scleral icterus.  Neck: Normal range of motion. Neck supple.  Cardiovascular: Normal rate, regular rhythm, normal heart sounds and intact distal pulses.   Pulmonary/Chest: Effort normal and breath sounds normal. No respiratory distress. No wheezes. No rales.  Abdominal: Soft. Bowel sounds are normal. Exhibits no distension and no mass. There is no tenderness.  Musculoskeletal: Normal range of motion. Exhibits no edema.  Lymphadenopathy:    No cervical adenopathy.  Neurological: Alert and oriented to person, place, and time. Exhibits muscle wasting.  She was examined in the wheelchair.  Skin: Skin is warm and dry. No rash noted. Not diaphoretic. No erythema. No pallor.  Psychiatric: Mood, memory and judgment normal.  Vitals reviewed.  LABORATORY DATA: Lab Results  Component Value Date   WBC 9.2 07/22/2023   HGB 13.4 07/22/2023   HCT 40.9 07/22/2023   MCV 94.0 07/22/2023   PLT 184 07/22/2023      Chemistry      Component Value Date/Time   NA 139 07/22/2023 0859   NA 139 08/02/2022 1112   K 4.7 07/22/2023 0859   CL 114 (H) 07/22/2023 0859   CO2  20 (L) 07/22/2023 0859   BUN 23 07/22/2023 0859   BUN 16 08/02/2022 1112   CREATININE 0.75 07/22/2023 0859      Component Value Date/Time   CALCIUM 9.4 07/22/2023 0859   ALKPHOS 74 07/22/2023 0859   AST 18 07/22/2023 0859   ALT 16 07/22/2023 0859   BILITOT 0.3 07/22/2023 0859       RADIOGRAPHIC STUDIES:  CT Chest W Contrast  Result Date: 07/22/2023 CLINICAL DATA:  Non-small-cell lung cancer. Restaging. * Tracking Code: BO * EXAM: CT CHEST WITH CONTRAST TECHNIQUE: Multidetector CT imaging of the chest was performed during intravenous contrast administration. RADIATION DOSE REDUCTION: This exam was performed  according to the departmental dose-optimization program which includes automated exposure control, adjustment of the mA and/or kV according to patient size and/or use of iterative reconstruction technique. CONTRAST:  75mL OMNIPAQUE IOHEXOL 300 MG/ML  SOLN COMPARISON:  05/30/2023 FINDINGS: Cardiovascular: The heart size is normal. No substantial pericardial effusion. Coronary artery calcification is evident. Mild atherosclerotic calcification is noted in the wall of the thoracic aorta. Mediastinum/Nodes: No mediastinal lymphadenopathy. Amorphous soft tissue noted previously in the pre-vascular space and AP window is similar today. There is no hilar lymphadenopathy. The esophagus has normal imaging features. Moderate hiatal hernia. Lungs/Pleura: Centrilobular and paraseptal emphysema evident. 7 mm posterior right upper lobe ground-glass nodule is unchanged on 55/8. Left lower lobe pulmonary nodule measured previously at 16 x 10 mm is 10 x 8 mm today on image 76/8. Bandlike adjacent parenchymal opacity is compatible with post treatment scarring. No new suspicious pulmonary nodule or mass. No focal airspace consolidation. There is no evidence of pleural effusion. Upper Abdomen: Visualized portion of the upper abdomen is unremarkable. Musculoskeletal: No worrisome lytic or sclerotic osseous  abnormality. IMPRESSION: 1. Interval decrease in size of the left lower lobe pulmonary nodule with adjacent post treatment scarring. 2. Stable 7 mm posterior right upper lobe ground-glass nodule. 3. Stable amorphous soft tissue in the pre-vascular space and AP window. 4. Moderate hiatal hernia. 5. Aortic Atherosclerosis (ICD10-I70.0) and Emphysema (ICD10-J43.9). Electronically Signed   By: Kennith Center M.D.   On: 07/22/2023 08:49   MR Brain W Wo Contrast  Result Date: 07/15/2023 CLINICAL DATA:  Metastatic disease evaluation History of strokes. Worsening veritgo and severe headaches. Assess for etiology EXAM: MRI HEAD WITHOUT AND WITH CONTRAST TECHNIQUE: Multiplanar, multiecho pulse sequences of the brain and surrounding structures were obtained without and with intravenous contrast. CONTRAST:  5mL GADAVIST GADOBUTROL 1 MMOL/ML IV SOLN COMPARISON:  CT head 06/12/23 FINDINGS: Brain: Negative for acute infarct. No hemorrhage. No hydrocephalus. No extra-axial fluid collection. Sequela of mild chronic microvascular ischemic change with chronic infarct in the posterior right frontal lobe. There is likely laminar necrosis associated with the chronic right frontal lobe infarct. No contrast enhancing lesion visualized. Vascular: Normal flow voids. Skull and upper cervical spine: Grade 1 anterolisthesis C3 on C4. Sinuses/Orbits: No middle ear or mastoid effusion. Mild mucosal thickening in the bilateral ethmoid air cells. Bilateral lens replacement. Orbits are otherwise unremarkable. Other: None. IMPRESSION: 1. No acute intracranial process. No evidence of intracranial metastatic disease. 2. Chronic infarct in the posterior right frontal lobe with laminar necrosis. Electronically Signed   By: Lorenza Cambridge M.D.   On: 07/15/2023 16:15     ASSESSMENT/PLAN:  This is a very pleasant 77 year old Caucasian female diagnosed with stage IV (T1c, N3, M1 C) non-small cell lung cancer, adenocarcinoma.  The patient presented with a  left upper lobe metastatic neoplasm in addition to extensive lymphadenopathy involving the neck, left hilar, mediastinal, and upper abdominal lymph nodes with multiple subcutaneous/muscular nodules.  She was diagnosed in January 2024.  Her molecular studies show she is positive for K-ras G12 C which can be used in the second line setting.  Her PD-L1 expression is 99%     The patient is going to start systemic/palliative chemotherapy and immunotherapy with carboplatin for AUC of 5, Alimta 500 mg/m, Keytruda 200 mg IV every 3 weeks.  Her first cycle of treatment on 12/30/22.  She started on treatment with single agent immunotherapy with Keytruda 200 mg IV starting from cycle #2 due to her recent stroke.  She  is status post 8 cycles total.    She was hospitalized in July 2024 for colitis which is likely immunotherapy related. She is taking Entocort as prescribed by GI and has an upcoming appointment in a few weeks.  Occurred after cycle #7.  She resumed treatment in August 2024.    The patient was seen with Dr. Arbutus Ped today.  The patient started developing mild diarrhea again.  She states it is controlled with Imodium.  She has less than 5 loose stools per day and has average of 2-4 depending on what she eats.  We will continue to monitor her symptoms for now.  Dr. Arbutus Ped would consider stopping her treatment if her diarrhea continues to worsen and is not responsive to Imodium.  For now he recommends that she continue with cycle #9 today as scheduled with close monitoring.  She knows how to reach [name] if worsening symptoms in the interval.    labs were reviewed.    We will see her back for follow-up visit in 3 weeks for evaluation repeat blood work before undergoing cycle #10.  He sees Dr. Barbaraann Cao for headaches and dizziness since her stroke.  He recommended resuming her Eliquis and resume Crestor.  He recommended she try and cut back on Fioricet and started her on Topamax 100 mg twice daily and start her  on a 7-day prescription of prednisone.  He is scheduled to see her today.  She sees Dr. Jena Gauss from gastroenterology. She will continue entocort for 12 weeks.   She is going to talk to her PCP tomorrow about possible liquid potassium pills.  The patient was advised to call immediately if she has any concerning symptoms in the interval. The patient voices understanding of current disease status and treatment options and is in agreement with the current care plan. All questions were answered. The patient knows to call the clinic with any problems, questions or concerns. We can certainly see the patient much sooner if necessary      No orders of the defined types were placed in this encounter.    Nahal Wanless L Dejuan Elman, PA-C 08/14/23  ADDENDUM: Hematology/Oncology Attending: I had a face to face encounter with the patient today.  I reviewed her record, lab and recommended her care plan.  This is a very pleasant 77 years old white female with a stage IV non-small cell lung cancer, adenocarcinoma diagnosed in January 2024 with positive KRAS G12C mutation and PD-L1 expression of 99%.  The patient started systemic chemotherapy initially with carboplatin, Alimta and Keytruda but this was discontinued after she had stroke with significant morbidities.  She is currently undergoing treatment with single agent Keytruda 200 Mg IV every 3 weeks status post 8 cycles and she has been tolerating the treatment well except for few episodes of diarrhea in the range of 2-3 today improved with Imodium. I recommended for the patient to continue her current treatment with [facility] for now.  If she has significant increase in the diarrhea, will consider discontinuing her treatment with immunotherapy and treat her with K-ras G 12C mutation inhibitor. The patient will come back for follow-up visit in 3 weeks for evaluation before the next cycle of her treatment. She was advised to call if she has any concerning  symptoms in the interval. Disclaimer: This note was dictated with voice recognition software. Similar sounding words can inadvertently be transcribed and may be missed upon review. Lajuana Matte, MD

## 2023-08-18 ENCOUNTER — Other Ambulatory Visit: Payer: Self-pay

## 2023-08-18 ENCOUNTER — Inpatient Hospital Stay: Payer: Medicare HMO | Attending: Internal Medicine

## 2023-08-18 ENCOUNTER — Inpatient Hospital Stay: Payer: Medicare HMO | Admitting: Physician Assistant

## 2023-08-18 ENCOUNTER — Inpatient Hospital Stay: Payer: Medicare HMO | Admitting: Internal Medicine

## 2023-08-18 ENCOUNTER — Inpatient Hospital Stay: Payer: Medicare HMO

## 2023-08-18 VITALS — BP 123/81 | HR 80 | Temp 97.5°F | Resp 16 | Wt 120.0 lb

## 2023-08-18 VITALS — BP 122/80 | HR 75 | Resp 15

## 2023-08-18 DIAGNOSIS — K529 Noninfective gastroenteritis and colitis, unspecified: Secondary | ICD-10-CM | POA: Insufficient documentation

## 2023-08-18 DIAGNOSIS — Z87891 Personal history of nicotine dependence: Secondary | ICD-10-CM | POA: Insufficient documentation

## 2023-08-18 DIAGNOSIS — C3492 Malignant neoplasm of unspecified part of left bronchus or lung: Secondary | ICD-10-CM

## 2023-08-18 DIAGNOSIS — Z79899 Other long term (current) drug therapy: Secondary | ICD-10-CM | POA: Diagnosis not present

## 2023-08-18 DIAGNOSIS — R519 Headache, unspecified: Secondary | ICD-10-CM

## 2023-08-18 DIAGNOSIS — R110 Nausea: Secondary | ICD-10-CM | POA: Insufficient documentation

## 2023-08-18 DIAGNOSIS — E876 Hypokalemia: Secondary | ICD-10-CM | POA: Insufficient documentation

## 2023-08-18 DIAGNOSIS — Z8041 Family history of malignant neoplasm of ovary: Secondary | ICD-10-CM | POA: Insufficient documentation

## 2023-08-18 DIAGNOSIS — Z5112 Encounter for antineoplastic immunotherapy: Secondary | ICD-10-CM | POA: Diagnosis not present

## 2023-08-18 DIAGNOSIS — Z8673 Personal history of transient ischemic attack (TIA), and cerebral infarction without residual deficits: Secondary | ICD-10-CM | POA: Insufficient documentation

## 2023-08-18 DIAGNOSIS — I6340 Cerebral infarction due to embolism of unspecified cerebral artery: Secondary | ICD-10-CM

## 2023-08-18 DIAGNOSIS — Z803 Family history of malignant neoplasm of breast: Secondary | ICD-10-CM | POA: Insufficient documentation

## 2023-08-18 DIAGNOSIS — C3412 Malignant neoplasm of upper lobe, left bronchus or lung: Secondary | ICD-10-CM | POA: Diagnosis not present

## 2023-08-18 DIAGNOSIS — E860 Dehydration: Secondary | ICD-10-CM | POA: Diagnosis not present

## 2023-08-18 LAB — CBC WITH DIFFERENTIAL (CANCER CENTER ONLY)
Abs Immature Granulocytes: 0.03 10*3/uL (ref 0.00–0.07)
Basophils Absolute: 0.1 10*3/uL (ref 0.0–0.1)
Basophils Relative: 1 %
Eosinophils Absolute: 0.1 10*3/uL (ref 0.0–0.5)
Eosinophils Relative: 1 %
HCT: 42.9 % (ref 36.0–46.0)
Hemoglobin: 14.2 g/dL (ref 12.0–15.0)
Immature Granulocytes: 0 %
Lymphocytes Relative: 20 %
Lymphs Abs: 1.7 10*3/uL (ref 0.7–4.0)
MCH: 30.7 pg (ref 26.0–34.0)
MCHC: 33.1 g/dL (ref 30.0–36.0)
MCV: 92.7 fL (ref 80.0–100.0)
Monocytes Absolute: 0.5 10*3/uL (ref 0.1–1.0)
Monocytes Relative: 5 %
Neutro Abs: 6.2 10*3/uL (ref 1.7–7.7)
Neutrophils Relative %: 73 %
Platelet Count: 212 10*3/uL (ref 150–400)
RBC: 4.63 MIL/uL (ref 3.87–5.11)
RDW: 12.4 % (ref 11.5–15.5)
WBC Count: 8.6 10*3/uL (ref 4.0–10.5)
nRBC: 0 % (ref 0.0–0.2)

## 2023-08-18 LAB — CMP (CANCER CENTER ONLY)
ALT: 12 U/L (ref 0–44)
AST: 19 U/L (ref 15–41)
Albumin: 4.2 g/dL (ref 3.5–5.0)
Alkaline Phosphatase: 66 U/L (ref 38–126)
Anion gap: 8 (ref 5–15)
BUN: 20 mg/dL (ref 8–23)
CO2: 17 mmol/L — ABNORMAL LOW (ref 22–32)
Calcium: 10 mg/dL (ref 8.9–10.3)
Chloride: 113 mmol/L — ABNORMAL HIGH (ref 98–111)
Creatinine: 0.86 mg/dL (ref 0.44–1.00)
GFR, Estimated: >60 mL/min (ref >60)
Glucose, Bld: 101 mg/dL — ABNORMAL HIGH (ref 70–99)
Potassium: 3.8 mmol/L (ref 3.5–5.1)
Sodium: 138 mmol/L (ref 135–145)
Total Bilirubin: 0.3 mg/dL (ref 0.3–1.2)
Total Protein: 6.9 g/dL (ref 6.5–8.1)

## 2023-08-18 LAB — TSH: TSH: 5.82 u[IU]/mL — ABNORMAL HIGH (ref 0.350–4.500)

## 2023-08-18 MED ORDER — SODIUM CHLORIDE 0.9 % IV SOLN: Freq: Once | INTRAVENOUS | Status: AC

## 2023-08-18 MED ORDER — TOPIRAMATE 50 MG PO TABS: 50.0000 mg | ORAL_TABLET | Freq: Two times a day (BID) | ORAL | 2 refills | Status: DC

## 2023-08-18 MED ORDER — SODIUM CHLORIDE 0.9 % IV SOLN
200.0000 mg | Freq: Once | INTRAVENOUS | Status: AC
  Administered 2023-08-18: 200 mg via INTRAVENOUS
  Filled 2023-08-18: qty 200

## 2023-08-18 NOTE — Progress Notes (Signed)
[facility] at [facility] [address]  [place], [place] [address] [phone]   Interval Evaluation  Date of Service: 08/18/23 Patient Name: Patricia [name] Patient MRN: [id number] Patient DOB: [birth year] Provider: Henreitta Leber, MD  Identifying Statement:  Patricia [name] is a 77 y.o. female with Cerebrovascular accident (CVA) due to embolism of cerebral artery (HCC)  Chronic daily headache who presents for initial consultation and evaluation regarding cancer associated neurologic deficits.    Referring Provider: [name], MD [address] [place],  [place] [address]  Primary Cancer:  Oncologic History: Oncology History  Non-small cell lung cancer, left (HCC)  12/19/2022 Initial Diagnosis   Adenocarcinoma of left lung, stage 4 (HCC)   12/19/2022 Cancer Staging   Staging form: Lung, AJCC 8th Edition - Clinical: Stage IVB (cT1c, cN3, cM1c) - Signed by Si Gaul, MD on 12/19/2022   12/30/2022 -  Chemotherapy   Patient is on Treatment Plan : LUNG Carboplatin (5) + Pemetrexed (500) + Pembrolizumab (200) D1 q21d Induction x 4 cycles / Maintenance Pemetrexed (500) + Pembrolizumab (200) D1 q21d       Interval History: Patricia [name] presents today for follow up.  Her headaches are considerably improved, she is dosing less Fioricet than prior, less than daily.  She still has generalized body aches and severe fatigue.  Remains on Topamax 100mg  BID through her PCP.  Scheduled for ketyruda infusion today.  H+P (07/28/23) Patient presents for post-stroke evaluation.  She describes improvement in left leg weakness, is walking around the house somewhat on her own.  Still uses wheelchair for longer trips.  Still having headaches "on sides of head" each day.  Dosing Fioricet three times each day over the past ~6 months or so.  No headache history prior to this period.  Sleep has been normal.  No other new or progressive symptoms, continues  on Keytruda with Dr. Arbutus Ped.   Medications: Current Outpatient Medications on File Prior to Visit  Medication Sig Dispense Refill   ALPRAZolam (XANAX) 0.5 MG tablet Take 1 tablet (0.5 mg total) by mouth at bedtime as needed for anxiety or sleep. TAKE (1) TABLET BY MOUTH AT BEDTIME AS NEEDED FOR SLEEP. 30 tablet 5   apixaban (ELIQUIS) 5 MG TABS tablet Take 1 tablet (5 mg total) by mouth 2 (two) times daily. 60 tablet 6   budesonide (ENTOCORT EC) 3 MG 24 hr capsule Take 3 capsules (9 mg total) by mouth daily. 168 capsule 0   butalbital-acetaminophen-caffeine (FIORICET) 50-325-40 MG tablet TAKE 1/2 TABLET BY MOUTH EVERY 6 (SIX) HOURS AS NEEDED FOR HEADACHE. 30 tablet 2   famotidine (PEPCID) 40 MG tablet Take 1 tablet (40 mg total) by mouth daily. 30 tablet 5   levothyroxine (SYNTHROID) 50 MCG tablet TAKE 1 TABLET BY MOUTH DAILY BEFORE BREAKFAST 90 tablet 1   Menthol-Methyl Salicylate (MUSCLE RUB) 10-15 % CREA Apply 1 Application topically 3 (three) times daily to bilateral calves 85 g 0   Multiple Vitamin (MULTIVITAMIN WITH MINERALS) TABS tablet Take 1 tablet by mouth daily.     ondansetron (ZOFRAN) 8 MG tablet Take 1 tablet (8 mg total) by mouth every 8 (eight) hours as needed for nausea or vomiting. 30 tablet 2   pantoprazole (PROTONIX) 40 MG tablet Take 1 tablet (40 mg total) by mouth daily. 30 tablet 1   potassium chloride SA (KLOR-CON M20) 20 MEQ tablet Take 2 tablets (40 mEq total) by mouth 2 (two) times  daily. 270 tablet 1   prochlorperazine (COMPAZINE) 10 MG tablet Take 1 tablet (10 mg total) by mouth every 6 (six) hours as needed for nausea or vomiting. 30 tablet 0   rosuvastatin (CRESTOR) 20 MG tablet Take 1 tablet (20 mg total) by mouth daily. 30 tablet 6   sertraline (ZOLOFT) 25 MG tablet TAKE 1 TABLET (25 MG TOTAL) BY MOUTH DAILY. 90 tablet 2   topiramate (TOPAMAX) 100 MG tablet TAKE 1 TABLET BY MOUTH TWICE A DAY 180 tablet 2   predniSONE (DELTASONE) 50 MG tablet Take 1 tablet (50 mg  total) by mouth daily with breakfast. (Patient not taking: Reported on 08/18/2023) 7 tablet 0   No current facility-administered medications on file prior to visit.    Allergies:  Allergies  Allergen Reactions   Codeine Nausea Only   Past Medical History:  Past Medical History:  Diagnosis Date   Depression    GERD (gastroesophageal reflux disease)    Hypercholesterolemia    IFG (impaired fasting glucose)    Nonintractable episodic headache 01/18/2023   Osteopenia    PONV (postoperative nausea and vomiting)    Stroke [facility])    Past Surgical History:  Past Surgical History:  Procedure Laterality Date   ABDOMINAL HYSTERECTOMY     BIOPSY  06/24/2023   Procedure: BIOPSY;  Surgeon: Dolores Frame, MD;  Location: [facility];  Service: Gastroenterology;;   CATARACT EXTRACTION W/PHACO  09/12/2011   Procedure: CATARACT EXTRACTION PHACO AND INTRAOCULAR LENS PLACEMENT (IOC);  Surgeon: Gemma Payor;  Location: [facility] ORS;  Service: Ophthalmology;  Laterality: Left;  CDE: 8.91   CATARACT EXTRACTION W/PHACO  11/18/2011   Procedure: CATARACT EXTRACTION PHACO AND INTRAOCULAR LENS PLACEMENT (IOC);  Surgeon: Gemma Payor;  Location: [facility];  Service: Ophthalmology;  Laterality: Right;  CDE:10.26   COLONOSCOPY  2007   Dr. Jena Gauss: internal hemorrhoids, single anal papilla poor prep.    COLONOSCOPY N/A 03/27/2017   Rourk: Normal exam.   COLONOSCOPY  06/24/2023   Procedure: COLONOSCOPY;  Surgeon: Marguerita Merles, Reuel Boom, MD;  Location: [facility];  Service: Gastroenterology;;   ECTOPIC PREGNANCY SURGERY     removal of right tube   ESOPHAGOGASTRODUODENOSCOPY (EGD) WITH PROPOFOL N/A 09/10/2021   Procedure: ESOPHAGOGASTRODUODENOSCOPY (EGD) WITH PROPOFOL;  Surgeon: Corbin Ade, MD;  Location: [facility];  Service: Endoscopy;  Laterality: N/A;  8:15AM   MALONEY DILATION N/A 09/10/2021   Procedure: Elease Hashimoto DILATION;  Surgeon: Corbin Ade, MD;  Location: [facility];  Service: Endoscopy;   Laterality: N/A;   OVARIAN CYST REMOVAL     right   RECTOCELE REPAIR N/A 01/18/2014   Procedure: POSTERIOR REPAIR (RECTOCELE);  Surgeon: Tilda Burrow, MD;  Location: [facility];  Service: Gynecology;  Laterality: N/A;   VAGINAL HYSTERECTOMY N/A 01/18/2014   Procedure: HYSTERECTOMY VAGINAL;  Surgeon: Tilda Burrow, MD;  Location: [facility];  Service: Gynecology;  Laterality: N/A;   Social History:  Social History   Socioeconomic History   Marital status: Married    Spouse name: Not on file   Number of children: Not on file   Years of education: Not on file   Highest education level: Not on file  Occupational History   Not on file  Tobacco Use   Smoking status: Former    Current packs/day: 0.00    Average packs/day: 1 pack/day for 20.0 years (20.0 ttl pk-yrs)    Types: Cigarettes    Start date: 09/10/1964    Quit date: 09/10/1984  Years since quitting: 38.9   Smokeless tobacco: Never  Vaping Use   Vaping status: Never Used  Substance and Sexual Activity   Alcohol use: No   Drug use: No   Sexual activity: Not Currently    Birth control/protection: Post-menopausal  Other Topics Concern   Not on file  Social History Narrative   Not on file   Social Determinants of Health   Financial Resource Strain: Not on file  Food Insecurity: No Food Insecurity (06/30/2023)   Hunger Vital Sign    Worried About Running Out of Food in the Last Year: Never true    Ran Out of Food in the Last Year: Never true  Transportation Needs: No Transportation Needs (06/23/2023)   PRAPARE - Administrator, Civil Service (Medical): No    Lack of Transportation (Non-Medical): No  Physical Activity: Not on file  Stress: Not on file  Social Connections: Unknown (04/20/2022)   [facility]   Social Network    Social Network: Not on file  Intimate Partner Violence: Not At Risk (06/23/2023)   Humiliation, Afraid, Rape, and Kick questionnaire    Fear of Current or  Ex-Partner: No    Emotionally Abused: No    Physically Abused: No    Sexually Abused: No   Family History:  Family History  Problem Relation Age of Onset   Osteoporosis Mother    Heart attack Father    Heart attack Brother 26       MI   Breast cancer Maternal Aunt    Breast cancer Paternal Aunt    Cancer Paternal Aunt        ovarian cancer   Anesthesia problems Neg Hx    Hypotension Neg Hx    Malignant hyperthermia Neg Hx    Pseudochol deficiency Neg Hx    Colon cancer Neg Hx    Inflammatory bowel disease Neg Hx     Review of Systems: Constitutional: Doesn't report fevers, chills or abnormal weight loss Eyes: Doesn't report blurriness of vision Ears, nose, mouth, throat, and face: Doesn't report sore throat Respiratory: Doesn't report cough, dyspnea or wheezes Cardiovascular: Doesn't report palpitation, chest discomfort  Gastrointestinal:  Doesn't report nausea, constipation, diarrhea GU: Doesn't report incontinence Skin: Doesn't report skin rashes Neurological: Per HPI Musculoskeletal: Doesn't report joint pain Behavioral/Psych: Doesn't report anxiety  Physical Exam: Wt Readings from Last 3 Encounters:  08/18/23 120 lb (54.4 kg)  07/28/23 123 lb 6.4 oz (56 kg)  07/28/23 121 lb 8 oz (55.1 kg)   Temp Readings from Last 3 Encounters:  08/18/23 (!) 97.5 F (36.4 C) (Oral)  07/28/23 98.2 F (36.8 C)  07/28/23 98 F (36.7 C)   BP Readings from Last 3 Encounters:  08/18/23 123/81  07/28/23 117/77  07/28/23 133/79   Pulse Readings from Last 3 Encounters:  08/18/23 80  07/28/23 80  07/28/23 71    KPS: 70. General: Hypophonic speech Head: Normal EENT: No conjunctival injection or scleral icterus.  Lungs: Resp effort normal Cardiac: Regular rate Abdomen: Non-distended abdomen Skin: No rashes cyanosis or petechiae. Extremities: No clubbing or edema  Neurologic Exam: Mental Status: Awake, alert, attentive to examiner. Oriented to self and environment.  Language is fluent with intact comprehension.  Cranial Nerves: Visual acuity is grossly normal. Visual fields are full. Extra-ocular movements intact. No ptosis. Face is symmetric Motor: Tone and bulk are normal. Power is 4/5 in left leg. Reflexes are symmetric, no pathologic reflexes present.  Sensory:  Intact to light touch Gait: Deferred   Labs: I have reviewed the data as listed    Component Value Date/Time   NA 138 08/18/2023 0921   NA 139 08/02/2022 1112   K 3.8 08/18/2023 0921   CL 113 (H) 08/18/2023 0921   CO2 17 (L) 08/18/2023 0921   GLUCOSE 101 (H) 08/18/2023 0921   BUN 20 08/18/2023 0921   BUN 16 08/02/2022 1112   CREATININE 0.86 08/18/2023 0921   CALCIUM 10.0 08/18/2023 0921   PROT 6.9 08/18/2023 0921   PROT 7.0 08/02/2022 1112   ALBUMIN 4.2 08/18/2023 0921   ALBUMIN 4.8 08/02/2022 1112   AST 19 08/18/2023 0921   ALT 12 08/18/2023 0921   ALKPHOS 66 08/18/2023 0921   BILITOT 0.3 08/18/2023 0921   GFRNONAA >60 08/18/2023 0921   GFRAA >90 01/19/2014 0530   Lab Results  Component Value Date   WBC 8.6 08/18/2023   NEUTROABS 6.2 08/18/2023   HGB 14.2 08/18/2023   HCT 42.9 08/18/2023   MCV 92.7 08/18/2023   PLT 212 08/18/2023    Assessment/Plan Cerebrovascular accident (CVA) due to embolism of cerebral artery (HCC)  Chronic daily headache  Patricia [name] is clinically stable today from CNS/stroke standpoint.  Headaches are improved with analgesia reduction.  She should continue Eliquis 5mg  BID as well as Crestor.  For headaches, we recommended reducing Topamax to 50mg  BID given fatigue and cognitive complaints.  We appreciate the opportunity to participate in the care of Patricia [name].  We'll touch base with her before next infusion in 1-2 months to continue adjusting headache medications.  All questions were answered. The patient knows to call the clinic with any problems, questions or concerns. No barriers to learning were detected.  The total  time spent in the encounter was 30 minutes and more than 50% was on counseling and review of test results   Henreitta Leber, MD Medical Director of [facility] at [facility] 08/18/23 10:51 AM

## 2023-08-18 NOTE — Patient Instructions (Signed)

## 2023-08-19 ENCOUNTER — Telehealth: Payer: Self-pay | Admitting: Medical Oncology

## 2023-08-19 ENCOUNTER — Other Ambulatory Visit: Payer: Self-pay | Admitting: Physician Assistant

## 2023-08-19 DIAGNOSIS — R197 Diarrhea, unspecified: Secondary | ICD-10-CM

## 2023-08-19 MED ORDER — METHYLPREDNISOLONE 4 MG PO TBPK
ORAL_TABLET | ORAL | 0 refills | Status: DC
Start: 2023-08-19 — End: 2023-10-14

## 2023-08-19 NOTE — Telephone Encounter (Signed)
15 watery stools since last night. She has lower abd pain that stops after she has a stool.   She only took a total of 4 imodium last night for 15 episodes of diarrhea. I re instructed Patricia [name] on taking 2 Imodium after 1st diarrhea stool then 1 after each stool up to 8/day. I told him she has to push oral fluids and drink at least 60 oz today of fluid  Medrol dose pack ordered He was instructed to pick up a medrol dose pack from her pharmacy and take per package instructions. I also asked him to call back tomorrow at 1100 with an update.

## 2023-08-20 ENCOUNTER — Ambulatory Visit (INDEPENDENT_AMBULATORY_CARE_PROVIDER_SITE_OTHER): Payer: Medicare HMO | Admitting: Family Medicine

## 2023-08-20 VITALS — BP 114/72 | HR 69 | Temp 98.1°F | Ht <= 58 in | Wt 118.4 lb

## 2023-08-20 DIAGNOSIS — F321 Major depressive disorder, single episode, moderate: Secondary | ICD-10-CM

## 2023-08-20 MED ORDER — SERTRALINE HCL 50 MG PO TABS: 50.0000 mg | ORAL_TABLET | Freq: Every day | ORAL | 1 refills | Status: DC

## 2023-08-20 MED ORDER — LANSOPRAZOLE 30 MG PO TBDD: DELAYED_RELEASE_TABLET | ORAL | 12 refills | Status: DC

## 2023-08-20 NOTE — Progress Notes (Signed)
Subjective:    Patient ID: Patricia [name], female    DOB: [birth year], 77 y.o.   MRN: [id number]  HPI Pt comes in today for 6 week follow up, pt has no concerns or questions at this time. Pt unsure of flu shot Moderate depression Feels down   Review of Systems     Objective:   Physical Exam  General-in no acute distress Eyes-no discharge Lungs-respiratory rate normal, CTA CV-no murmurs,RRR Extremities skin warm dry no edema Neuro grossly normal Behavior normal, alert       Assessment & Plan:   Patient being treated for lung cancer She is hanging in there Under a lot of stress Moderate depression Initiate sertraline Follow-up within 4 to 6 weeks  Hold off on flu shot due to prednisone use

## 2023-08-22 ENCOUNTER — Other Ambulatory Visit: Payer: Self-pay

## 2023-08-25 ENCOUNTER — Inpatient Hospital Stay (HOSPITAL_BASED_OUTPATIENT_CLINIC_OR_DEPARTMENT_OTHER): Payer: Medicare HMO | Admitting: Physician Assistant

## 2023-08-25 ENCOUNTER — Inpatient Hospital Stay: Payer: Medicare HMO

## 2023-08-25 ENCOUNTER — Other Ambulatory Visit: Payer: Self-pay

## 2023-08-25 ENCOUNTER — Telehealth: Payer: Self-pay | Admitting: *Deleted

## 2023-08-25 VITALS — BP 118/69 | HR 66 | Temp 98.1°F | Resp 16

## 2023-08-25 DIAGNOSIS — R197 Diarrhea, unspecified: Secondary | ICD-10-CM

## 2023-08-25 DIAGNOSIS — E876 Hypokalemia: Secondary | ICD-10-CM

## 2023-08-25 DIAGNOSIS — C3492 Malignant neoplasm of unspecified part of left bronchus or lung: Secondary | ICD-10-CM

## 2023-08-25 DIAGNOSIS — Z5112 Encounter for antineoplastic immunotherapy: Secondary | ICD-10-CM | POA: Diagnosis not present

## 2023-08-25 DIAGNOSIS — R110 Nausea: Secondary | ICD-10-CM | POA: Diagnosis not present

## 2023-08-25 LAB — CMP (CANCER CENTER ONLY)
ALT: 11 U/L (ref 0–44)
AST: 13 U/L — ABNORMAL LOW (ref 15–41)
Albumin: 3.8 g/dL (ref 3.5–5.0)
Alkaline Phosphatase: 66 U/L (ref 38–126)
Anion gap: 8 (ref 5–15)
BUN: 20 mg/dL (ref 8–23)
CO2: 22 mmol/L (ref 22–32)
Calcium: 9.3 mg/dL (ref 8.9–10.3)
Chloride: 109 mmol/L (ref 98–111)
Creatinine: 0.86 mg/dL (ref 0.44–1.00)
GFR, Estimated: >60 mL/min (ref >60)
Glucose, Bld: 102 mg/dL — ABNORMAL HIGH (ref 70–99)
Potassium: 2.7 mmol/L — CL (ref 3.5–5.1)
Sodium: 139 mmol/L (ref 135–145)
Total Bilirubin: 0.3 mg/dL (ref 0.3–1.2)
Total Protein: 6.2 g/dL — ABNORMAL LOW (ref 6.5–8.1)

## 2023-08-25 LAB — CBC WITH DIFFERENTIAL (CANCER CENTER ONLY)
Abs Immature Granulocytes: 0.01 10*3/uL (ref 0.00–0.07)
Basophils Absolute: 0 10*3/uL (ref 0.0–0.1)
Basophils Relative: 1 %
Eosinophils Absolute: 0.1 10*3/uL (ref 0.0–0.5)
Eosinophils Relative: 1 %
HCT: 39.9 % (ref 36.0–46.0)
Hemoglobin: 13.4 g/dL (ref 12.0–15.0)
Immature Granulocytes: 0 %
Lymphocytes Relative: 32 %
Lymphs Abs: 2.1 10*3/uL (ref 0.7–4.0)
MCH: 30.1 pg (ref 26.0–34.0)
MCHC: 33.6 g/dL (ref 30.0–36.0)
MCV: 89.7 fL (ref 80.0–100.0)
Monocytes Absolute: 0.5 10*3/uL (ref 0.1–1.0)
Monocytes Relative: 8 %
Neutro Abs: 3.8 10*3/uL (ref 1.7–7.7)
Neutrophils Relative %: 58 %
Platelet Count: 242 10*3/uL (ref 150–400)
RBC: 4.45 MIL/uL (ref 3.87–5.11)
RDW: 12 % (ref 11.5–15.5)
WBC Count: 6.5 10*3/uL (ref 4.0–10.5)
nRBC: 0 % (ref 0.0–0.2)

## 2023-08-25 LAB — MAGNESIUM: Magnesium: 1.9 mg/dL (ref 1.7–2.4)

## 2023-08-25 MED ORDER — SODIUM CHLORIDE 0.9 % IV SOLN: INTRAVENOUS | Status: AC

## 2023-08-25 MED ORDER — ONDANSETRON HCL 4 MG/2ML IJ SOLN
4.0000 mg | Freq: Once | INTRAMUSCULAR | Status: AC
  Administered 2023-08-25: 4 mg via INTRAVENOUS
  Filled 2023-08-25: qty 2

## 2023-08-25 MED ORDER — PREDNISONE 10 MG PO TABS: ORAL_TABLET | ORAL | 0 refills | Status: AC

## 2023-08-25 MED ORDER — POTASSIUM CHLORIDE CRYS ER 20 MEQ PO TBCR
40.0000 meq | EXTENDED_RELEASE_TABLET | Freq: Once | ORAL | Status: AC
  Administered 2023-08-25: 40 meq via ORAL
  Filled 2023-08-25: qty 2

## 2023-08-25 MED ORDER — POTASSIUM CHLORIDE 10 MEQ/100ML IV SOLN
10.0000 meq | INTRAVENOUS | Status: AC
  Administered 2023-08-25 (×2): 10 meq via INTRAVENOUS
  Filled 2023-08-25 (×2): qty 100

## 2023-08-25 NOTE — Patient Instructions (Signed)
Start taking your potassium pills tomorrow as directed on the bottle.  Start taking the steroid tomorrow. We recommend taking it in the morning with breakfast.  We will see you on Wednesday to recheck your labs.

## 2023-08-25 NOTE — Patient Instructions (Signed)

## 2023-08-25 NOTE — Telephone Encounter (Signed)
Pt was taking steroids for diarrhea, not z-pack. Pt is taking 2 imodium in the morning and then after every loose stool. Is still going at least 5-6 times per day according to family. Taking in plenty of liquids.     Pt is coming in a 1:30 pm to our [facility] for labs and IVF

## 2023-08-25 NOTE — Telephone Encounter (Signed)
Patricia [name]'s family states the z-pack for diarrhea is not working. Please advise

## 2023-08-25 NOTE — Progress Notes (Signed)
Symptom Management Consult Note [facility]    Patient Care Team: [name], MD as PCP - General (Family Medicine)    Name / MRN / DOB: Patricia [name]  [id number]  05-13-1946   Date of visit: 08/25/2023   Chief Complaint/Reason for visit: diarrhea   Current Therapy: Keytruda  Last treatment:  Day 1   Cycle 9 on 08/18/23   ASSESSMENT & PLAN: Patient is a 77 y.o. female with oncologic history of stage IV (T1c, N3, M1 C) non-small cell lung cancer, adenocarcinoma followed by Dr. Arbutus Ped.  I have viewed most recent oncology note and lab work.    # Stage IV (T1c, N3, M1 C) non-small cell lung cancer, adenocarcinoma  - Next appointment with oncologist is 09/09/23   #Diarrhea -Prescribed medrol dosepak 08/19/23 by primary oncology team for colitis. -Grade 2 per HPI. -PE patient appears dehydrated, benign abdominal exam. -Discussed with Dr. Shirline Frees who agrees with plan for high-dose steroids.  Prescription sent to the pharmacy and patient will start tomorrow morning.  #Nausea -Patient has not been taking antiemetics at home. Encouraged her to take medications when needed. -Patient given IV Zofran and nausea resolved.  Reassessed patient and she is tolerating p.o. intake.   #Hypokalemia -Patient received 2 runs of IV potassium in clinic and 40 mEq PO. -She will return to clinic in x 2 days to check labs and IV replacement if needed. -Patient has prescription for PO potassium at home already, has not been taking. -Encouraged her to start taking as directed tomorrow. -Magnesium WNL  Strict ED precautions discussed should symptoms worsen.   Heme/Onc History: Oncology History  Non-small cell lung cancer, left (HCC)  12/19/2022 Initial Diagnosis   Adenocarcinoma of left lung, stage 4 (HCC)   12/19/2022 Cancer Staging   Staging form: Lung, AJCC 8th Edition - Clinical: Stage IVB (cT1c, cN3, cM1c) - Signed by Si Gaul, MD on 12/19/2022    12/30/2022 -  Chemotherapy   Patient is on Treatment Plan : LUNG Carboplatin (5) + Pemetrexed (500) + Pembrolizumab (200) D1 q21d Induction x 4 cycles / Maintenance Pemetrexed (500) + Pembrolizumab (200) D1 q21d         Interval history-: Patricia [name] is a 77 y.o. female with oncologic history as above presenting to [facility] today with chief complaint of diarrhea. She is accompanied by family member who provides additional history.  Patient is endorsing 5 episodes of diarrhea daily.  She describes the stool as liquid consistency.  She does not feel Imodium is controlling her symptoms.  She finished her Medrol Dosepak x 2 days ago.  Patient states while on the steroids diarrhea only minimally improved. She denies associated abdominal pain, fever, chills. She is currently nauseous, has not taking any zofran or compazine at home. Patient still taking Entocort as prescribed gastroenterologist.  Patient states she was only able to drink 12 ounces of water yesterday.  She is scared to eat because "everything goes right through" her.        ROS  All other systems are reviewed and are negative for acute change except as noted in the HPI.    Allergies  Allergen Reactions   Codeine Nausea Only     Past Medical History:  Diagnosis Date   Depression    GERD (gastroesophageal reflux disease)    Hypercholesterolemia    IFG (impaired fasting glucose)    Nonintractable episodic headache 01/18/2023   Osteopenia    PONV (postoperative nausea  and vomiting)    Stroke [facility])      Past Surgical History:  Procedure Laterality Date   ABDOMINAL HYSTERECTOMY     BIOPSY  06/24/2023   Procedure: BIOPSY;  Surgeon: Dolores Frame, MD;  Location: [facility];  Service: Gastroenterology;;   CATARACT EXTRACTION W/PHACO  09/12/2011   Procedure: CATARACT EXTRACTION PHACO AND INTRAOCULAR LENS PLACEMENT (IOC);  Surgeon: Gemma Payor;  Location: [facility] ORS;  Service: Ophthalmology;  Laterality: Left;   CDE: 8.91   CATARACT EXTRACTION W/PHACO  11/18/2011   Procedure: CATARACT EXTRACTION PHACO AND INTRAOCULAR LENS PLACEMENT (IOC);  Surgeon: Gemma Payor;  Location: [facility] OR[facility];  Service: Ophthalmology;  Laterality: Right;  CDE:10.26   COLONOSCOPY  2007   Dr. Jena Gauss: internal hemorrhoids, single anal papilla poor prep.    COLONOSCOPY N/A 03/27/2017   Rourk: Normal exam.   COLONOSCOPY  06/24/2023   Procedure: COLONOSCOPY;  Surgeon: Marguerita Merles, Reuel Boom, MD;  Location: [facility];  Service: Gastroenterology;;   ECTOPIC PREGNANCY SURGERY     removal of right tube   ESOPHAGOGASTRODUODENOSCOPY (EGD) WITH PROPOFOL N/A 09/10/2021   Procedure: ESOPHAGOGASTRODUODENOSCOPY (EGD) WITH PROPOFOL;  Surgeon: Corbin Ade, MD;  Location[facility];  Service: Endoscopy;  Laterality: N/A;  8:15AM   MALONEY DILATION N/A 09/10/2021   Procedure: Elease Hashimoto DILATION;  Surgeon: Corbin Ade, MD;  Location: [facility];  Service: Endoscopy;  Laterality: N/A;   OVARIAN CYST REMOVAL     right   RECTOCELE REPAIR N/A 01/18/2014   Procedure: POSTERIOR REPAIR (RECTOCELE);  Surgeon: Tilda Burrow, MD;  Location: [facility];  Service: Gynecology;  Laterality: N/A;   VAGINAL HYSTERECTOMY N/A 01/18/2014   Procedure: HYSTERECTOMY VAGINAL;  Surgeon: Tilda Burrow, MD;  Location: [facility];  Service: Gynecology;  Laterality: N/A;    Social History   Socioeconomic History   Marital status: Married    Spouse name: Not on file   Number of children: Not on file   Years of education: Not on file   Highest education level: Not on file  Occupational History   Not on file  Tobacco Use   Smoking status: Former    Current packs/day: 0.00    Average packs/day: 1 pack/day for 20.0 years (20.0 ttl pk-yrs)    Types: Cigarettes    Start date: 09/10/1964    Quit date: 09/10/1984    Years since quitting: 38.9   Smokeless tobacco: Never  Vaping Use   Vaping status: Never Used  Substance and Sexual Activity   Alcohol use: No    Drug use: No   Sexual activity: Not Currently    Birth control/protection: Post-menopausal  Other Topics Concern   Not on file  Social History Narrative   Not on file   Social Determinants of Health   Financial Resource Strain: Not on file  Food Insecurity: No Food Insecurity (06/30/2023)   Hunger Vital Sign    Worried About Running Out of Food in the Last Year: Never true    Ran Out of Food in the Last Year: Never true  Transportation Needs: No Transportation Needs (06/23/2023)   PRAPARE - Administrator, Civil Service (Medical): No    Lack of Transportation (Non-Medical): No  Physical Activity: Not on file  Stress: Not on file  Social Connections: Unknown (04/20/2022)   [facility]   Social Network    Social Network: Not on file  Intimate Partner Violence: Not At Risk (06/23/2023)  Humiliation, Afraid, Rape, and Kick questionnaire    Fear of Current or Ex-Partner: No    Emotionally Abused: No    Physically Abused: No    Sexually Abused: No    Family History  Problem Relation Age of Onset   Osteoporosis Mother    Heart attack Father    Heart attack Brother 62       MI   Breast cancer Maternal Aunt    Breast cancer Paternal Aunt    Cancer Paternal Aunt        ovarian cancer   Anesthesia problems Neg Hx    Hypotension Neg Hx    Malignant hyperthermia Neg Hx    Pseudochol deficiency Neg Hx    Colon cancer Neg Hx    Inflammatory bowel disease Neg Hx      Current Outpatient Medications:    [START ON 08/26/2023] predniSONE (DELTASONE) 10 MG tablet, Take 5 tablets (50 mg total) by mouth daily with breakfast for 5 days, THEN 4 tablets (40 mg total) daily with breakfast for 5 days, THEN 3 tablets (30 mg total) daily with breakfast for 5 days, THEN 2 tablets (20 mg total) daily with breakfast for 5 days, THEN 1 tablet (10 mg total) daily with breakfast for 5 days., Disp: 75 tablet, Rfl: 0   ALPRAZolam (XANAX) 0.5 MG tablet, Take 1  tablet (0.5 mg total) by mouth at bedtime as needed for anxiety or sleep. TAKE (1) TABLET BY MOUTH AT BEDTIME AS NEEDED FOR SLEEP., Disp: 30 tablet, Rfl: 5   apixaban (ELIQUIS) 5 MG TABS tablet, Take 1 tablet (5 mg total) by mouth 2 (two) times daily., Disp: 60 tablet, Rfl: 6   butalbital-acetaminophen-caffeine (FIORICET) 50-325-40 MG tablet, TAKE 1/2 TABLET BY MOUTH EVERY 6 (SIX) HOURS AS NEEDED FOR HEADACHE., Disp: 30 tablet, Rfl: 2   famotidine (PEPCID) 40 MG tablet, Take 1 tablet (40 mg total) by mouth daily., Disp: 30 tablet, Rfl: 5   lansoprazole (PREVACID SOLUTAB) 30 MG disintegrating tablet, 1 qd, Disp: 30 tablet, Rfl: 12   levothyroxine (SYNTHROID) 50 MCG tablet, TAKE 1 TABLET BY MOUTH DAILY BEFORE BREAKFAST, Disp: 90 tablet, Rfl: 1   Menthol-Methyl Salicylate (MUSCLE RUB) 10-15 % CREA, Apply 1 Application topically 3 (three) times daily to bilateral calves, Disp: 85 g, Rfl: 0   methylPREDNISolone (MEDROL DOSEPAK) 4 MG TBPK tablet, Use as instructed, Disp: 21 tablet, Rfl: 0   Multiple Vitamin (MULTIVITAMIN WITH MINERALS) TABS tablet, Take 1 tablet by mouth daily., Disp: , Rfl:    ondansetron (ZOFRAN) 8 MG tablet, Take 1 tablet (8 mg total) by mouth every 8 (eight) hours as needed for nausea or vomiting., Disp: 30 tablet, Rfl: 2   pantoprazole (PROTONIX) 40 MG tablet, Take 1 tablet (40 mg total) by mouth daily., Disp: 30 tablet, Rfl: 1   potassium chloride SA (KLOR-CON M20) 20 MEQ tablet, Take 2 tablets (40 mEq total) by mouth 2 (two) times daily., Disp: 270 tablet, Rfl: 1   prochlorperazine (COMPAZINE) 10 MG tablet, Take 1 tablet (10 mg total) by mouth every 6 (six) hours as needed for nausea or vomiting., Disp: 30 tablet, Rfl: 0   rosuvastatin (CRESTOR) 20 MG tablet, Take 1 tablet (20 mg total) by mouth daily., Disp: 30 tablet, Rfl: 6   sertraline (ZOLOFT) 50 MG tablet, Take 1 tablet (50 mg total) by mouth daily., Disp: 90 tablet, Rfl: 1   topiramate (TOPAMAX) 50 MG tablet, Take 1 tablet (50  mg total) by mouth 2 (two) times  daily., Disp: 60 tablet, Rfl: 2 No current facility-administered medications for this visit.  Facility-Administered Medications Ordered in Other Visits:    potassium chloride 10 mEq in 100 mL IVPB, 10 mEq, Intravenous, Q1 Hr x 2, Walisiewicz, Elie Gragert E, PA-C, Last Rate: 100 mL/hr at 08/25/23 1559, 10 mEq at 08/25/23 1559  PHYSICAL EXAM: ECOG FS:2 - Symptomatic, <50% confined to bed    Vitals:   08/25/23 1409 08/25/23 1410  BP: 118/69 118/69  Pulse: 66 66  Resp: 16 16  Temp: 98.1 F (36.7 C) 98.1 F (36.7 C)  TempSrc: Oral Oral  SpO2: 97% 97%   Physical Exam Vitals and nursing note reviewed.  Constitutional:      Appearance: She is not ill-appearing or toxic-appearing.  HENT:     Head: Normocephalic.     Mouth/Throat:     Mouth: Mucous membranes are dry.  Eyes:     Conjunctiva/sclera: Conjunctivae normal.  Cardiovascular:     Rate and Rhythm: Normal rate and regular rhythm.     Pulses: Normal pulses.     Heart sounds: Normal heart sounds.  Pulmonary:     Effort: Pulmonary effort is normal.     Breath sounds: Normal breath sounds.  Abdominal:     General: Bowel sounds are normal. There is no distension.     Palpations: Abdomen is soft. There is no mass.     Tenderness: There is no abdominal tenderness. There is no guarding or rebound.     Hernia: No hernia is present.  Musculoskeletal:     Cervical back: Normal range of motion.  Skin:    General: Skin is warm and dry.  Neurological:     Mental Status: She is alert.        LABORATORY DATA: I have reviewed the data as listed    Latest Ref Rng & Units 08/25/2023    1:38 PM 08/18/2023    9:21 AM 07/22/2023    8:59 AM  CBC  WBC 4.0 - 10.5 K/uL 6.5  8.6  9.2   Hemoglobin 12.0 - 15.0 g/dL 78.2  95.6  21.3   Hematocrit 36.0 - 46.0 % 39.9  42.9  40.9   Platelets 150 - 400 K/uL 242  212  184         Latest Ref Rng & Units 08/25/2023    1:38 PM 08/18/2023    9:21 AM 07/22/2023     8:59 AM  CMP  Glucose 70 - 99 mg/dL 086  578  94   BUN 8 - 23 mg/dL 20  20  23    Creatinine 0.44 - 1.00 mg/dL 4.69  6.29  5.28   Sodium 135 - 145 mmol/L 139  138  139   Potassium 3.5 - 5.1 mmol/L 2.7  3.8  4.7   Chloride 98 - 111 mmol/L 109  113  114   CO2 22 - 32 mmol/L 22  17  20    Calcium 8.9 - 10.3 mg/dL 9.3  41.3  9.4   Total Protein 6.5 - 8.1 g/dL 6.2  6.9  6.4   Total Bilirubin 0.3 - 1.2 mg/dL 0.3  0.3  0.3   Alkaline Phos 38 - 126 U/L 66  66  74   AST 15 - 41 U/L 13  19  18    ALT 0 - 44 U/L 11  12  16         RADIOGRAPHIC STUDIES (from last 24 hours if applicable) I have personally reviewed the radiological images  as listed and agreed with the findings in the report. No results found.      Visit Diagnosis: 1. Non-small cell lung cancer, left (HCC)   2. Diarrhea, unspecified type   3. Nausea without vomiting   4. Hypokalemia      No orders of the defined types were placed in this encounter.   All questions were answered. The patient knows to call the clinic with any problems, questions or concerns. No barriers to learning was detected.  A total of more than 30 minutes were spent on this encounter with face-to-face time and non-face-to-face time, including preparing to see the patient, ordering tests and/or medications, counseling the patient and coordination of care as outlined above.    Thank you for allowing me to participate in the care of this patient.    Shanon Ace, PA-C Department of Hematology/Oncology [facility] Phone: [phone]  [phone]    08/25/2023 4:27 PM

## 2023-08-25 NOTE — Progress Notes (Signed)
CRITICAL VALUE STICKER  CRITICAL VALUE: Potassium 2.7  RECEIVER (on-site recipient of call): Henriette Combs, RN  DATE & TIME NOTIFIED: 08/25/23, 1426  MESSENGER (representative from lab): Para Skeans  MD NOTIFIED: Namon Cirri, PA-C  TIME OF NOTIFICATION: 1427  RESPONSE:  Potassium replacement

## 2023-08-26 ENCOUNTER — Encounter: Payer: Medicare HMO | Admitting: Physician Assistant

## 2023-08-27 ENCOUNTER — Inpatient Hospital Stay (HOSPITAL_BASED_OUTPATIENT_CLINIC_OR_DEPARTMENT_OTHER): Payer: Medicare HMO | Admitting: Physician Assistant

## 2023-08-27 ENCOUNTER — Other Ambulatory Visit: Payer: Self-pay

## 2023-08-27 ENCOUNTER — Inpatient Hospital Stay: Payer: Medicare HMO

## 2023-08-27 VITALS — BP 137/77 | HR 64 | Temp 97.8°F | Resp 18 | Wt 120.0 lb

## 2023-08-27 DIAGNOSIS — R197 Diarrhea, unspecified: Secondary | ICD-10-CM | POA: Diagnosis not present

## 2023-08-27 DIAGNOSIS — E876 Hypokalemia: Secondary | ICD-10-CM | POA: Diagnosis not present

## 2023-08-27 DIAGNOSIS — Z5112 Encounter for antineoplastic immunotherapy: Secondary | ICD-10-CM | POA: Diagnosis not present

## 2023-08-27 DIAGNOSIS — C3492 Malignant neoplasm of unspecified part of left bronchus or lung: Secondary | ICD-10-CM

## 2023-08-27 LAB — MAGNESIUM: Magnesium: 1.9 mg/dL (ref 1.7–2.4)

## 2023-08-27 LAB — CMP (CANCER CENTER ONLY)
ALT: 27 U/L (ref 0–44)
AST: 23 U/L (ref 15–41)
Albumin: 3.6 g/dL (ref 3.5–5.0)
Alkaline Phosphatase: 71 U/L (ref 38–126)
Anion gap: 8 (ref 5–15)
BUN: 21 mg/dL (ref 8–23)
CO2: 22 mmol/L (ref 22–32)
Calcium: 9.4 mg/dL (ref 8.9–10.3)
Chloride: 112 mmol/L — ABNORMAL HIGH (ref 98–111)
Creatinine: 0.87 mg/dL (ref 0.44–1.00)
GFR, Estimated: >60 mL/min (ref >60)
Glucose, Bld: 93 mg/dL (ref 70–99)
Potassium: 2.9 mmol/L — ABNORMAL LOW (ref 3.5–5.1)
Sodium: 142 mmol/L (ref 135–145)
Total Bilirubin: 0.3 mg/dL (ref 0.3–1.2)
Total Protein: 6 g/dL — ABNORMAL LOW (ref 6.5–8.1)

## 2023-08-27 LAB — CBC WITH DIFFERENTIAL (CANCER CENTER ONLY)
Abs Immature Granulocytes: 0.03 10*3/uL (ref 0.00–0.07)
Basophils Absolute: 0.1 10*3/uL (ref 0.0–0.1)
Basophils Relative: 1 %
Eosinophils Absolute: 0 10*3/uL (ref 0.0–0.5)
Eosinophils Relative: 0 %
HCT: 39.9 % (ref 36.0–46.0)
Hemoglobin: 13.1 g/dL (ref 12.0–15.0)
Immature Granulocytes: 0 %
Lymphocytes Relative: 34 %
Lymphs Abs: 2.3 10*3/uL (ref 0.7–4.0)
MCH: 29.8 pg (ref 26.0–34.0)
MCHC: 32.8 g/dL (ref 30.0–36.0)
MCV: 90.9 fL (ref 80.0–100.0)
Monocytes Absolute: 1 10*3/uL (ref 0.1–1.0)
Monocytes Relative: 14 %
Neutro Abs: 3.4 10*3/uL (ref 1.7–7.7)
Neutrophils Relative %: 51 %
Platelet Count: 250 10*3/uL (ref 150–400)
RBC: 4.39 MIL/uL (ref 3.87–5.11)
RDW: 11.8 % (ref 11.5–15.5)
WBC Count: 6.8 10*3/uL (ref 4.0–10.5)
nRBC: 0 % (ref 0.0–0.2)

## 2023-08-27 MED ORDER — POTASSIUM CHLORIDE 10 MEQ/100ML IV SOLN
10.0000 meq | INTRAVENOUS | Status: AC
  Administered 2023-08-27 (×3): 10 meq via INTRAVENOUS
  Filled 2023-08-27 (×3): qty 100

## 2023-08-27 MED ORDER — SODIUM CHLORIDE 0.9 % IV SOLN: Freq: Once | INTRAVENOUS | Status: AC

## 2023-08-27 MED ORDER — POTASSIUM CHLORIDE CRYS ER 20 MEQ PO TBCR
40.0000 meq | EXTENDED_RELEASE_TABLET | Freq: Once | ORAL | Status: AC
  Administered 2023-08-27: 40 meq via ORAL
  Filled 2023-08-27: qty 2

## 2023-08-27 NOTE — Progress Notes (Signed)
Symptom Management Consult Note [facility]    Patient Care Team: [name], MD as PCP - General (Family Medicine)    Name / MRN / DOB: Patricia [name]  [id number]  04-02-1946   Date of visit: 08/27/2023   Chief Complaint/Reason for visit: recheck labs   Current Therapy: Keytruda  Last treatment:  Day 1   Cycle 9 on 08/18/23   ASSESSMENT & PLAN: Patient is a 77 y.o. female with oncologic history of stage IV (T1c, N3, M1 C) non-small cell lung cancer, adenocarcinoma followed by Dr. Arbutus Ped.  I have viewed most recent oncology note and lab work.    #Stage IV (T1c, N3, M1 C) non-small cell lung cancer, adenocarcinoma  - Next appointment with oncologist is 09/09/23.  #Hypokalemia -Potassium today is 2.9, slightly improved from last clinic visit x 2 days ago. -Patient will receive 3 runs of IV potassium in clinic today as well as 40 mEq p.o. replacement. She will continue PO replacement at home. -Will have patient return in x 2 days to check labs.   #Diarrhea -Started high-dose steroids yesterday, only one dose taken so far. Plans to take them after clinic visit today. -Abdominal exam is benign. Clinically appears dehydrated. 1L NS administered in clinic. Kidney function is normal on CMP. -Patient stopped Entocort recently because there was confusion if she is supposed to be on it per spouse. I reviewed GI note from 07/25/23 with Dr. Jena Gauss and the plan was "I recommend decrease Entocort to  (2) tablets daily beginning tomorrow.  Take 2 daily for 6 weeks; then decrease to 1 tablet daily for subsequent 6 weeks." I discussed this with patient and spouse and they plan to resume medication.  -Diarrhea is expected be improving by then after 3 days of high dose prednisone.  Discussed plan with Dr. Arbutus Ped who agrees.  Strict ED precautions discussed should symptoms worsen.   Heme/Onc History: Oncology History  Non-small cell lung cancer, left (HCC)  12/19/2022  Initial Diagnosis   Adenocarcinoma of left lung, stage 4 (HCC)   12/19/2022 Cancer Staging   Staging form: Lung, AJCC 8th Edition - Clinical: Stage IVB (cT1c, cN3, cM1c) - Signed by Si Gaul, MD on 12/19/2022   12/30/2022 -  Chemotherapy   Patient is on Treatment Plan : LUNG Carboplatin (5) + Pemetrexed (500) + Pembrolizumab (200) D1 q21d Induction x 4 cycles / Maintenance Pemetrexed (500) + Pembrolizumab (200) D1 q21d         Interval history-: Patricia [name] is a 77 y.o. female with oncologic history as above presenting to [facility] today with chief complaint of recheck labs. She is accompanied her spouse and grandson who provide additional history.  Patient states since her last visit she has continued to have diarrhea.  She had 5 episodes of liquid stool yesterday.  She took her first dose of high dose prednisone yesterday.  She did not take the dose prior to visit today because she had not yet had breakfast.  She plans to take it when she gets home this afternoon.  Patient admits to intermittent abdominal cramping in her lower abdomen prior to bowel movements.  Denies any pain currently.  She was feeling nauseous and took Zofran this morning, nausea has now resolved.  She has not had any fevers.  She was able to tolerate apple sauce yesterday and a few bites of food. She reports not feeling much better since her last visit. She has been able to  take her home medications.      ROS  All other systems are reviewed and are negative for acute change except as noted in the HPI.    Allergies  Allergen Reactions   Codeine Nausea Only     Past Medical History:  Diagnosis Date   Depression    GERD (gastroesophageal reflux disease)    Hypercholesterolemia    IFG (impaired fasting glucose)    Nonintractable episodic headache 01/18/2023   Osteopenia    PONV (postoperative nausea and vomiting)    Stroke [facility])      Past Surgical History:  Procedure Laterality Date   ABDOMINAL  HYSTERECTOMY     BIOPSY  06/24/2023   Procedure: BIOPSY;  Surgeon: Dolores Frame, MD;  Location: [facility];  Service: Gastroenterology;;   CATARACT EXTRACTION W/PHACO  09/12/2011   Procedure: CATARACT EXTRACTION PHACO AND INTRAOCULAR LENS PLACEMENT (IOC);  Surgeon: Gemma Payor;  Location: [facility] OR[facility];  Service: Ophthalmology;  Laterality: Left;  CDE: 8.91   CATARACT EXTRACTION W/PHACO  11/18/2011   Procedure: CATARACT EXTRACTION PHACO AND INTRAOCULAR LENS PLACEMENT (IOC);  Surgeon: Gemma Payor;  Location: [facility] OR[facility];  Service: Ophthalmology;  Laterality: Right;  CDE:10.26   COLONOSCOPY  2007   Dr. Jena Gauss: internal hemorrhoids, single anal papilla poor prep.    COLONOSCOPY N/A 03/27/2017   Rourk: Normal exam.   COLONOSCOPY  06/24/2023   Procedure: COLONOSCOPY;  Surgeon: Marguerita Merles, Reuel Boom, MD;  Location: [facility];  Service: Gastroenterology;;   ECTOPIC PREGNANCY SURGERY     removal of right tube   ESOPHAGOGASTRODUODENOSCOPY (EGD) WITH PROPOFOL N/A 09/10/2021   Procedure: ESOPHAGOGASTRODUODENOSCOPY (EGD) WITH PROPOFOL;  Surgeon: Corbin Ade, MD;  Location: [facility];  Service: Endoscopy;  Laterality: N/A;  8:15AM   MALONEY DILATION N/A 09/10/2021   Procedure: Elease Hashimoto DILATION;  Surgeon: Corbin Ade, MD;  Location: [facility];  Service: Endoscopy;  Laterality: N/A;   OVARIAN CYST REMOVAL     right   RECTOCELE REPAIR N/A 01/18/2014   Procedure: POSTERIOR REPAIR (RECTOCELE);  Surgeon: Tilda Burrow, MD;  Location: [facility];  Service: Gynecology;  Laterality: N/A;   VAGINAL HYSTERECTOMY N/A 01/18/2014   Procedure: HYSTERECTOMY VAGINAL;  Surgeon: Tilda Burrow, MD;  Location: [facility];  Service: Gynecology;  Laterality: N/A;    Social History   Socioeconomic History   Marital status: Married    Spouse name: Not on file   Number of children: Not on file   Years of education: Not on file   Highest education level: Not on file  Occupational History   Not on file   Tobacco Use   Smoking status: Former    Current packs/day: 0.00    Average packs/day: 1 pack/day for 20.0 years (20.0 ttl pk-yrs)    Types: Cigarettes    Start date: 09/10/1964    Quit date: 09/10/1984    Years since quitting: 38.9   Smokeless tobacco: Never  Vaping Use   Vaping status: Never Used  Substance and Sexual Activity   Alcohol use: No   Drug use: No   Sexual activity: Not Currently    Birth control/protection: Post-menopausal  Other Topics Concern   Not on file  Social History Narrative   Not on file   Social Determinants of Health   Financial Resource Strain: Not on file  Food Insecurity: No Food Insecurity (06/30/2023)   Hunger Vital Sign    Worried About Running Out of Food in the Last Year: Never  true    Ran Out of Food in the Last Year: Never true  Transportation Needs: No Transportation Needs (06/23/2023)   PRAPARE - Administrator, Civil Service (Medical): No    Lack of Transportation (Non-Medical): No  Physical Activity: Not on file  Stress: Not on file  Social Connections: Unknown (04/20/2022)   Received [facility]   Social Network    Social Network: Not on file  Intimate Partner Violence: Not At Risk (06/23/2023)   Humiliation, Afraid, Rape, and Kick questionnaire    Fear of Current or Ex-Partner: No    Emotionally Abused: No    Physically Abused: No    Sexually Abused: No    Family History  Problem Relation Age of Onset   Osteoporosis Mother    Heart attack Father    Heart attack Brother 1       MI   Breast cancer Maternal Aunt    Breast cancer Paternal Aunt    Cancer Paternal Aunt        ovarian cancer   Anesthesia problems Neg Hx    Hypotension Neg Hx    Malignant hyperthermia Neg Hx    Pseudochol deficiency Neg Hx    Colon cancer Neg Hx    Inflammatory bowel disease Neg Hx      Current Outpatient Medications:    ALPRAZolam (XANAX) 0.5 MG tablet, Take 1 tablet (0.5 mg total) by mouth at bedtime as  needed for anxiety or sleep. TAKE (1) TABLET BY MOUTH AT BEDTIME AS NEEDED FOR SLEEP., Disp: 30 tablet, Rfl: 5   apixaban (ELIQUIS) 5 MG TABS tablet, Take 1 tablet (5 mg total) by mouth 2 (two) times daily., Disp: 60 tablet, Rfl: 6   butalbital-acetaminophen-caffeine (FIORICET) 50-325-40 MG tablet, TAKE 1/2 TABLET BY MOUTH EVERY 6 (SIX) HOURS AS NEEDED FOR HEADACHE., Disp: 30 tablet, Rfl: 2   famotidine (PEPCID) 40 MG tablet, Take 1 tablet (40 mg total) by mouth daily., Disp: 30 tablet, Rfl: 5   lansoprazole (PREVACID SOLUTAB) 30 MG disintegrating tablet, 1 qd, Disp: 30 tablet, Rfl: 12   levothyroxine (SYNTHROID) 50 MCG tablet, TAKE 1 TABLET BY MOUTH DAILY BEFORE BREAKFAST, Disp: 90 tablet, Rfl: 1   Menthol-Methyl Salicylate (MUSCLE RUB) 10-15 % CREA, Apply 1 Application topically 3 (three) times daily to bilateral calves, Disp: 85 g, Rfl: 0   methylPREDNISolone (MEDROL DOSEPAK) 4 MG TBPK tablet, Use as instructed, Disp: 21 tablet, Rfl: 0   Multiple Vitamin (MULTIVITAMIN WITH MINERALS) TABS tablet, Take 1 tablet by mouth daily., Disp: , Rfl:    ondansetron (ZOFRAN) 8 MG tablet, Take 1 tablet (8 mg total) by mouth every 8 (eight) hours as needed for nausea or vomiting., Disp: 30 tablet, Rfl: 2   pantoprazole (PROTONIX) 40 MG tablet, Take 1 tablet (40 mg total) by mouth daily., Disp: 30 tablet, Rfl: 1   potassium chloride SA (KLOR-CON M20) 20 MEQ tablet, Take 2 tablets (40 mEq total) by mouth 2 (two) times daily., Disp: 270 tablet, Rfl: 1   predniSONE (DELTASONE) 10 MG tablet, Take 5 tablets (50 mg total) by mouth daily with breakfast for 5 days, THEN 4 tablets (40 mg total) daily with breakfast for 5 days, THEN 3 tablets (30 mg total) daily with breakfast for 5 days, THEN 2 tablets (20 mg total) daily with breakfast for 5 days, THEN 1 tablet (10 mg total) daily with breakfast for 5 days., Disp: 75 tablet, Rfl: 0   prochlorperazine (COMPAZINE) 10 MG  tablet, Take 1 tablet (10 mg total) by mouth every 6  (six) hours as needed for nausea or vomiting., Disp: 30 tablet, Rfl: 0   rosuvastatin (CRESTOR) 20 MG tablet, Take 1 tablet (20 mg total) by mouth daily., Disp: 30 tablet, Rfl: 6   sertraline (ZOLOFT) 50 MG tablet, Take 1 tablet (50 mg total) by mouth daily., Disp: 90 tablet, Rfl: 1   topiramate (TOPAMAX) 50 MG tablet, Take 1 tablet (50 mg total) by mouth 2 (two) times daily., Disp: 60 tablet, Rfl: 2 No current facility-administered medications for this visit.  Facility-Administered Medications Ordered in Other Visits:    potassium chloride 10 mEq in 100 mL IVPB, 10 mEq, Intravenous, Q1 Hr x 3, Walisiewicz, Chelsea Pedretti E, PA-C, Last Rate: 100 mL/hr at 08/27/23 1118, 10 mEq at 08/27/23 1118  PHYSICAL EXAM: ECOG FS:1 - Symptomatic but completely ambulatory    Vitals:   08/27/23 0918  BP: 137/77  Pulse: 64  Resp: 18  Temp: 97.8 F (36.6 C)  SpO2: 100%  Weight: 120 lb (54.4 kg)   Physical Exam Vitals and nursing note reviewed.  Constitutional:      Appearance: She is not ill-appearing or toxic-appearing.  HENT:     Head: Normocephalic.     Mouth/Throat:     Mouth: Mucous membranes are dry.  Eyes:     Conjunctiva/sclera: Conjunctivae normal.  Cardiovascular:     Rate and Rhythm: Normal rate and regular rhythm.     Pulses: Normal pulses.     Heart sounds: Normal heart sounds.  Pulmonary:     Effort: Pulmonary effort is normal.     Breath sounds: Normal breath sounds.  Abdominal:     General: There is no distension.     Palpations: Abdomen is soft.     Tenderness: There is no abdominal tenderness. There is no guarding or rebound.  Musculoskeletal:     Cervical back: Normal range of motion.  Skin:    General: Skin is warm and dry.  Neurological:     Mental Status: She is alert.        LABORATORY DATA: I have reviewed the data as listed    Latest Ref Rng & Units 08/27/2023    8:38 AM 08/25/2023    1:38 PM 08/18/2023    9:21 AM  CBC  WBC 4.0 - 10.5 K/uL 6.8  6.5  8.6    Hemoglobin 12.0 - 15.0 g/dL 02.7  25.3  66.4   Hematocrit 36.0 - 46.0 % 39.9  39.9  42.9   Platelets 150 - 400 K/uL 250  242  212         Latest Ref Rng & Units 08/27/2023    8:38 AM 08/25/2023    1:38 PM 08/18/2023    9:21 AM  CMP  Glucose 70 - 99 mg/dL 93  403  474   BUN 8 - 23 mg/dL 21  20  20    Creatinine 0.44 - 1.00 mg/dL 2.59  5.63  8.75   Sodium 135 - 145 mmol/L 142  139  138   Potassium 3.5 - 5.1 mmol/L 2.9  2.7  3.8   Chloride 98 - 111 mmol/L 112  109  113   CO2 22 - 32 mmol/L 22  22  17    Calcium 8.9 - 10.3 mg/dL 9.4  9.3  64.3   Total Protein 6.5 - 8.1 g/dL 6.0  6.2  6.9   Total Bilirubin 0.3 - 1.2 mg/dL 0.3  0.3  0.3  Alkaline Phos 38 - 126 U/L 71  66  66   AST 15 - 41 U/L 23  13  19    ALT 0 - 44 U/L 27  11  12         RADIOGRAPHIC STUDIES (from last 24 hours if applicable) I have personally reviewed the radiological images as listed and agreed with the findings in the report. No results found.      Visit Diagnosis: 1. Hypokalemia   2. Diarrhea, unspecified type   3. Non-small cell lung cancer, left (HCC)      No orders of the defined types were placed in this encounter.   All questions were answered. The patient knows to call the clinic with any problems, questions or concerns. No barriers to learning was detected.  A total of more than 30 minutes were spent on this encounter with face-to-face time and non-face-to-face time, including preparing to see the patient, ordering tests and/or medications, counseling the patient and coordination of care as outlined above.    Thank you for allowing me to participate in the care of this patient.    Shanon Ace, PA-C Department of Hematology/Oncology [facility] Phone: [phone]  [phone]    08/27/2023 12:19 PM

## 2023-08-27 NOTE — Patient Instructions (Signed)
Potassium Chloride Injection What is this medication? POTASSIUM CHLORIDE (poe TASS i um KLOOR ide) prevents and treats low levels of potassium in your body. Potassium plays an important role in maintaining the health of your kidneys, heart, muscles, and nervous system. This medicine may be used for other purposes; ask your health care provider or pharmacist if you have questions. COMMON BRAND NAME(S): PROAMP What should I tell my care team before I take this medication? They need to know if you have any of these conditions: Addison disease Dehydration Diabetes (high blood sugar) Heart disease High levels of potassium in the blood Irregular heartbeat or rhythm Kidney disease Large areas of burned skin An unusual or allergic reaction to potassium, other medications, foods, dyes, or preservatives Pregnant or trying to get pregnant Breast-feeding How should I use this medication? This medication is injected into a vein. It is given in a hospital or clinic setting. Talk to your care team about the use of this medication in children. Special care may be needed. Overdosage: If you think you have taken too much of this medicine contact a poison control center or emergency room at once. NOTE: This medicine is only for you. Do not share this medicine with others. What if I miss a dose? This does not apply. This medication is not for regular use. What may interact with this medication? Do not take this medication with any of the following: Certain diuretics, such as spironolactone, triamterene Eplerenone Sodium polystyrene sulfonate This medication may also interact with the following: Certain medications for blood pressure or heart disease, such as lisinopril, losartan, quinapril, valsartan Medications that lower your chance of fighting infection, such as cyclosporine, tacrolimus NSAIDs, medications for pain and inflammation, such as ibuprofen or naproxen Other potassium supplements Salt  substitutes This list may not describe all possible interactions. Give your health care provider a list of all the medicines, herbs, non-prescription drugs, or dietary supplements you use. Also tell them if you smoke, drink alcohol, or use illegal drugs. Some items may interact with your medicine. What should I watch for while using this medication? Visit your care team for regular checks on your progress. Tell your care team if your symptoms do not start to get better or if they get worse. You may need blood work while you are taking this medication. Avoid salt substitutes unless you are told otherwise by your care team. What side effects may I notice from receiving this medication? Side effects that you should report to your care team as soon as possible: Allergic reactions--skin rash, itching, hives, swelling of the face, lips, tongue, or throat High potassium level--muscle weakness, fast or irregular heartbeat Side effects that usually do not require medical attention (report to your care team if they continue or are bothersome): Diarrhea Nausea Stomach pain Vomiting This list may not describe all possible side effects. Call your doctor for medical advice about side effects. You may report side effects to FDA at 1-800-FDA-1088. Where should I keep my medication? This medication is given in a hospital or clinic. It will not be stored at home. NOTE: This sheet is a summary. It may not cover all possible information. If you have questions about this medicine, talk to your doctor, pharmacist, or health care provider.  2024 Elsevier/Gold Standard (2022-06-07 00:00:00)

## 2023-08-28 ENCOUNTER — Other Ambulatory Visit: Payer: Self-pay

## 2023-08-28 ENCOUNTER — Telehealth: Payer: Self-pay

## 2023-08-28 MED ORDER — BUDESONIDE 3 MG PO CPEP: 6.0000 mg | ORAL_CAPSULE | Freq: Every day | ORAL | 3 refills | Status: DC

## 2023-08-28 NOTE — Telephone Encounter (Signed)
Pt was made aware.

## 2023-08-28 NOTE — Telephone Encounter (Signed)
Pt is back to having diarrhea and is wanting to know if she can go back up on the entocort. Pt states that 1 tablet daily is not working. Please advise.

## 2023-08-29 ENCOUNTER — Inpatient Hospital Stay: Payer: Medicare HMO

## 2023-08-29 ENCOUNTER — Inpatient Hospital Stay (HOSPITAL_BASED_OUTPATIENT_CLINIC_OR_DEPARTMENT_OTHER): Payer: Medicare HMO | Admitting: Physician Assistant

## 2023-08-29 VITALS — BP 114/71 | HR 56 | Temp 98.0°F | Resp 16 | Wt 122.2 lb

## 2023-08-29 DIAGNOSIS — C3492 Malignant neoplasm of unspecified part of left bronchus or lung: Secondary | ICD-10-CM

## 2023-08-29 DIAGNOSIS — R197 Diarrhea, unspecified: Secondary | ICD-10-CM | POA: Diagnosis not present

## 2023-08-29 DIAGNOSIS — Z5112 Encounter for antineoplastic immunotherapy: Secondary | ICD-10-CM | POA: Diagnosis not present

## 2023-08-29 DIAGNOSIS — E876 Hypokalemia: Secondary | ICD-10-CM

## 2023-08-29 LAB — CMP (CANCER CENTER ONLY)
ALT: 18 U/L (ref 0–44)
AST: 13 U/L — ABNORMAL LOW (ref 15–41)
Albumin: 3.4 g/dL — ABNORMAL LOW (ref 3.5–5.0)
Alkaline Phosphatase: 63 U/L (ref 38–126)
Anion gap: 5 (ref 5–15)
BUN: 13 mg/dL (ref 8–23)
CO2: 23 mmol/L (ref 22–32)
Calcium: 9.4 mg/dL (ref 8.9–10.3)
Chloride: 113 mmol/L — ABNORMAL HIGH (ref 98–111)
Creatinine: 0.82 mg/dL (ref 0.44–1.00)
GFR, Estimated: >60 mL/min (ref >60)
Glucose, Bld: 99 mg/dL (ref 70–99)
Potassium: 3.5 mmol/L (ref 3.5–5.1)
Sodium: 141 mmol/L (ref 135–145)
Total Bilirubin: 0.2 mg/dL — ABNORMAL LOW (ref 0.3–1.2)
Total Protein: 5.6 g/dL — ABNORMAL LOW (ref 6.5–8.1)

## 2023-08-29 LAB — MAGNESIUM: Magnesium: 1.8 mg/dL (ref 1.7–2.4)

## 2023-08-29 LAB — CBC WITH DIFFERENTIAL (CANCER CENTER ONLY)
Abs Immature Granulocytes: 0.07 10*3/uL (ref 0.00–0.07)
Basophils Absolute: 0.1 10*3/uL (ref 0.0–0.1)
Basophils Relative: 1 %
Eosinophils Absolute: 0 10*3/uL (ref 0.0–0.5)
Eosinophils Relative: 0 %
HCT: 37.5 % (ref 36.0–46.0)
Hemoglobin: 12.6 g/dL (ref 12.0–15.0)
Immature Granulocytes: 1 %
Lymphocytes Relative: 19 %
Lymphs Abs: 1.7 10*3/uL (ref 0.7–4.0)
MCH: 30.1 pg (ref 26.0–34.0)
MCHC: 33.6 g/dL (ref 30.0–36.0)
MCV: 89.7 fL (ref 80.0–100.0)
Monocytes Absolute: 0.6 10*3/uL (ref 0.1–1.0)
Monocytes Relative: 7 %
Neutro Abs: 6.6 10*3/uL (ref 1.7–7.7)
Neutrophils Relative %: 72 %
Platelet Count: 270 10*3/uL (ref 150–400)
RBC: 4.18 MIL/uL (ref 3.87–5.11)
RDW: 12.1 % (ref 11.5–15.5)
WBC Count: 9.1 10*3/uL (ref 4.0–10.5)
nRBC: 0 % (ref 0.0–0.2)

## 2023-08-29 NOTE — Progress Notes (Signed)
Symptom Management Consult Note [facility]    Patient Care Team: [name], MD as PCP - General (Family Medicine)    Name / MRN / DOB: Patricia [name]  [id number]  October 15, 1946   Date of visit: 08/29/2023   Chief Complaint/Reason for visit: recheck labs   Current Therapy:  Keytruda  Last treatment:  Day 1   Cycle 9 08/18/23   ASSESSMENT & PLAN: Patient is a 77 y.o. female with oncologic history of stage IV (T1c, N3, M1 C) non-small cell lung cancer, adenocarcinoma followed by Dr. Arbutus Ped.  I have viewed most recent oncology note and lab work.    #Stage IV (T1c, N3, M1 C) non-small cell lung cancer, adenocarcinoma  - Next appointment with oncologist is 09/09/23.  #Diarrhea -Improving per patient now that she resumed Entocort. Still taking high dose steroids. -Encouraged patient to continue Entocort as prescribed and keep GI follow up. -Unsure if diarrhea is from immunotherapy vs lymphocytic colitis. Patient and spouse do not remember when exactly she stopped the Entocort.  #Hypokalemia -Potassium today is 3.5.  Will not give IV potassium in clinic.  Discussed with patient she should continue p.o. potassium at home especially if diarrhea continues.  Labs will be rechecked at upcoming oncology appointment.  Strict ED precautions discussed should symptoms worsen.   Heme/Onc History: Oncology History  Non-small cell lung cancer, left (HCC)  12/19/2022 Initial Diagnosis   Adenocarcinoma of left lung, stage 4 (HCC)   12/19/2022 Cancer Staging   Staging form: Lung, AJCC 8th Edition - Clinical: Stage IVB (cT1c, cN3, cM1c) - Signed by Si Gaul, MD on 12/19/2022   12/30/2022 -  Chemotherapy   Patient is on Treatment Plan : LUNG Carboplatin (5) + Pemetrexed (500) + Pembrolizumab (200) D1 q21d Induction x 4 cycles / Maintenance Pemetrexed (500) + Pembrolizumab (200) D1 q21d         Interval history-: Patricia [name] is a 77 y.o. female with  oncologic history as above presenting to [facility] today with chief complaint of recheck labs.  She is accompanied by her spouse and grandson who provide additional history.  Patient states she feels like she is finally improving.  She had 5 episodes of loose stool yesterday however it was smaller amount than it had been.  Family clarified with GI that patient is supposed to be taking Entocort therefore she resumed it last night taking 2 tablets.  She states since taking that medicine she has only had 2 episodes of diarrhea.  She also continues to take the p.o. potassium and steroids as prescribed.  Patient is tolerating water and small snacks.  She has not had any fever.  Her abdominal cramping has also decreased in frequency.  Patient is hoping not to need IV fluids or IV potassium today.      ROS  All other systems are reviewed and are negative for acute change except as noted in the HPI.    Allergies  Allergen Reactions   Codeine Nausea Only     Past Medical History:  Diagnosis Date   Depression    GERD (gastroesophageal reflux disease)    Hypercholesterolemia    IFG (impaired fasting glucose)    Nonintractable episodic headache 01/18/2023   Osteopenia    PONV (postoperative nausea and vomiting)    Stroke [facility])      Past Surgical History:  Procedure Laterality Date   ABDOMINAL HYSTERECTOMY     BIOPSY  06/24/2023   Procedure: BIOPSY;  Surgeon: Marguerita Merles, Reuel Boom, MD;  Location: [facility];  Service: Gastroenterology;;   CATARACT EXTRACTION W/PHACO  09/12/2011   Procedure: CATARACT EXTRACTION PHACO AND INTRAOCULAR LENS PLACEMENT (IOC);  Surgeon: Gemma Payor;  Location: [facility] ORS;  Service: Ophthalmology;  Laterality: Left;  CDE: 8.91   CATARACT EXTRACTION W/PHACO  11/18/2011   Procedure: CATARACT EXTRACTION PHACO AND INTRAOCULAR LENS PLACEMENT (IOC);  Surgeon: Gemma Payor;  Location: [facility] OR[facility];  Service: Ophthalmology;  Laterality: Right;  CDE:10.26   COLONOSCOPY  2007   Dr.  Jena Gauss: internal hemorrhoids, single anal papilla poor prep.    COLONOSCOPY N/A 03/27/2017   Rourk: Normal exam.   COLONOSCOPY  06/24/2023   Procedure: COLONOSCOPY;  Surgeon: Marguerita Merles, Reuel Boom, MD;  Location: [facility];  Service: Gastroenterology;;   ECTOPIC PREGNANCY SURGERY     removal of right tube   ESOPHAGOGASTRODUODENOSCOPY (EGD) WITH PROPOFOL N/A 09/10/2021   Procedure: ESOPHAGOGASTRODUODENOSCOPY (EGD) WITH PROPOFOL;  Surgeon: Corbin Ade, MD;  Location: [facility];  Service: Endoscopy;  Laterality: N/A;  8:15AM   MALONEY DILATION N/A 09/10/2021   Procedure: Elease Hashimoto DILATION;  Surgeon: Corbin Ade, MD;  Location: [facility];  Service: Endoscopy;  Laterality: N/A;   OVARIAN CYST REMOVAL     right   RECTOCELE REPAIR N/A 01/18/2014   Procedure: POSTERIOR REPAIR (RECTOCELE);  Surgeon: Tilda Burrow, MD;  Location: [facility];  Service: Gynecology;  Laterality: N/A;   VAGINAL HYSTERECTOMY N/A 01/18/2014   Procedure: HYSTERECTOMY VAGINAL;  Surgeon: Tilda Burrow, MD;  Location: [facility];  Service: Gynecology;  Laterality: N/A;    Social History   Socioeconomic History   Marital status: Married    Spouse name: Not on file   Number of children: Not on file   Years of education: Not on file   Highest education level: Not on file  Occupational History   Not on file  Tobacco Use   Smoking status: Former    Current packs/day: 0.00    Average packs/day: 1 pack/day for 20.0 years (20.0 ttl pk-yrs)    Types: Cigarettes    Start date: 09/10/1964    Quit date: 09/10/1984    Years since quitting: 38.9   Smokeless tobacco: Never  Vaping Use   Vaping status: Never Used  Substance and Sexual Activity   Alcohol use: No   Drug use: No   Sexual activity: Not Currently    Birth control/protection: Post-menopausal  Other Topics Concern   Not on file  Social History Narrative   Not on file   Social Determinants of Health   Financial Resource Strain: Not on file   Food Insecurity: No Food Insecurity (06/30/2023)   Hunger Vital Sign    Worried About Running Out of Food in the Last Year: Never true    Ran Out of Food in the Last Year: Never true  Transportation Needs: No Transportation Needs (06/23/2023)   PRAPARE - Administrator, Civil Service (Medical): No    Lack of Transportation (Non-Medical): No  Physical Activity: Not on file  Stress: Not on file  Social Connections: Unknown (04/20/2022)   [facility]   Social Network    Social Network: Not on file  Intimate Partner Violence: Not At Risk (06/23/2023)   Humiliation, Afraid, Rape, and Kick questionnaire    Fear of Current or Ex-Partner: No    Emotionally Abused: No    Physically Abused: No    Sexually Abused: No  Family History  Problem Relation Age of Onset   Osteoporosis Mother    Heart attack Father    Heart attack Brother 58       MI   Breast cancer Maternal Aunt    Breast cancer Paternal Aunt    Cancer Paternal Aunt        ovarian cancer   Anesthesia problems Neg Hx    Hypotension Neg Hx    Malignant hyperthermia Neg Hx    Pseudochol deficiency Neg Hx    Colon cancer Neg Hx    Inflammatory bowel disease Neg Hx      Current Outpatient Medications:    ALPRAZolam (XANAX) 0.5 MG tablet, Take 1 tablet (0.5 mg total) by mouth at bedtime as needed for anxiety or sleep. TAKE (1) TABLET BY MOUTH AT BEDTIME AS NEEDED FOR SLEEP., Disp: 30 tablet, Rfl: 5   apixaban (ELIQUIS) 5 MG TABS tablet, Take 1 tablet (5 mg total) by mouth 2 (two) times daily., Disp: 60 tablet, Rfl: 6   budesonide (ENTOCORT EC) 3 MG 24 hr capsule, Take 2 capsules (6 mg total) by mouth daily., Disp: 60 capsule, Rfl: 3   butalbital-acetaminophen-caffeine (FIORICET) 50-325-40 MG tablet, TAKE 1/2 TABLET BY MOUTH EVERY 6 (SIX) HOURS AS NEEDED FOR HEADACHE., Disp: 30 tablet, Rfl: 2   famotidine (PEPCID) 40 MG tablet, Take 1 tablet (40 mg total) by mouth daily., Disp: 30  tablet, Rfl: 5   lansoprazole (PREVACID SOLUTAB) 30 MG disintegrating tablet, 1 qd, Disp: 30 tablet, Rfl: 12   levothyroxine (SYNTHROID) 50 MCG tablet, TAKE 1 TABLET BY MOUTH DAILY BEFORE BREAKFAST, Disp: 90 tablet, Rfl: 1   Menthol-Methyl Salicylate (MUSCLE RUB) 10-15 % CREA, Apply 1 Application topically 3 (three) times daily to bilateral calves, Disp: 85 g, Rfl: 0   methylPREDNISolone (MEDROL DOSEPAK) 4 MG TBPK tablet, Use as instructed, Disp: 21 tablet, Rfl: 0   Multiple Vitamin (MULTIVITAMIN WITH MINERALS) TABS tablet, Take 1 tablet by mouth daily., Disp: , Rfl:    ondansetron (ZOFRAN) 8 MG tablet, Take 1 tablet (8 mg total) by mouth every 8 (eight) hours as needed for nausea or vomiting., Disp: 30 tablet, Rfl: 2   pantoprazole (PROTONIX) 40 MG tablet, Take 1 tablet (40 mg total) by mouth daily., Disp: 30 tablet, Rfl: 1   potassium chloride SA (KLOR-CON M20) 20 MEQ tablet, Take 2 tablets (40 mEq total) by mouth 2 (two) times daily., Disp: 270 tablet, Rfl: 1   predniSONE (DELTASONE) 10 MG tablet, Take 5 tablets (50 mg total) by mouth daily with breakfast for 5 days, THEN 4 tablets (40 mg total) daily with breakfast for 5 days, THEN 3 tablets (30 mg total) daily with breakfast for 5 days, THEN 2 tablets (20 mg total) daily with breakfast for 5 days, THEN 1 tablet (10 mg total) daily with breakfast for 5 days., Disp: 75 tablet, Rfl: 0   prochlorperazine (COMPAZINE) 10 MG tablet, Take 1 tablet (10 mg total) by mouth every 6 (six) hours as needed for nausea or vomiting., Disp: 30 tablet, Rfl: 0   rosuvastatin (CRESTOR) 20 MG tablet, Take 1 tablet (20 mg total) by mouth daily., Disp: 30 tablet, Rfl: 6   sertraline (ZOLOFT) 50 MG tablet, Take 1 tablet (50 mg total) by mouth daily., Disp: 90 tablet, Rfl: 1   topiramate (TOPAMAX) 50 MG tablet, Take 1 tablet (50 mg total) by mouth 2 (two) times daily., Disp: 60 tablet, Rfl: 2  PHYSICAL EXAM: ECOG FS:1 - Symptomatic but completely ambulatory  Vitals:    08/29/23 0852  BP: 114/71  Pulse: (!) 56  Resp: 16  Temp: 98 F (36.7 C)  TempSrc: Oral  SpO2: 96%  Weight: 122 lb 3.2 oz (55.4 kg)   Physical Exam Vitals and nursing note reviewed.  Constitutional:      Appearance: She is not ill-appearing or toxic-appearing.  HENT:     Head: Normocephalic.     Mouth/Throat:     Mouth: Mucous membranes are moist.  Eyes:     Conjunctiva/sclera: Conjunctivae normal.  Cardiovascular:     Rate and Rhythm: Normal rate.  Pulmonary:     Effort: Pulmonary effort is normal.     Breath sounds: Normal breath sounds.  Abdominal:     General: There is no distension.     Palpations: Abdomen is soft. There is no mass.     Tenderness: There is no abdominal tenderness. There is no guarding or rebound.     Hernia: No hernia is present.  Musculoskeletal:     Cervical back: Normal range of motion.  Skin:    General: Skin is warm and dry.  Neurological:     Mental Status: She is alert.        LABORATORY DATA: I have reviewed the data as listed    Latest Ref Rng & Units 08/29/2023    8:29 AM 08/27/2023    8:38 AM 08/25/2023    1:38 PM  CBC  WBC 4.0 - 10.5 K/uL 9.1  6.8  6.5   Hemoglobin 12.0 - 15.0 g/dL 16.1  09.6  04.5   Hematocrit 36.0 - 46.0 % 37.5  39.9  39.9   Platelets 150 - 400 K/uL 270  250  242         Latest Ref Rng & Units 08/29/2023    8:29 AM 08/27/2023    8:38 AM 08/25/2023    1:38 PM  CMP  Glucose 70 - 99 mg/dL 99  93  409   BUN 8 - 23 mg/dL 13  21  20    Creatinine 0.44 - 1.00 mg/dL 8.11  9.14  7.82   Sodium 135 - 145 mmol/L 141  142  139   Potassium 3.5 - 5.1 mmol/L 3.5  2.9  2.7   Chloride 98 - 111 mmol/L 113  112  109   CO2 22 - 32 mmol/L 23  22  22    Calcium 8.9 - 10.3 mg/dL 9.4  9.4  9.3   Total Protein 6.5 - 8.1 g/dL 5.6  6.0  6.2   Total Bilirubin 0.3 - 1.2 mg/dL 0.2  0.3  0.3   Alkaline Phos 38 - 126 U/L 63  71  66   AST 15 - 41 U/L 13  23  13    ALT 0 - 44 U/L 18  27  11         RADIOGRAPHIC STUDIES (from  last 24 hours if applicable) I have personally reviewed the radiological images as listed and agreed with the findings in the report. No results found.      Visit Diagnosis: 1. Diarrhea, unspecified type   2. Non-small cell lung cancer, left (HCC)      No orders of the defined types were placed in this encounter.   All questions were answered. The patient knows to call the clinic with any problems, questions or concerns. No barriers to learning was detected.  A total of more than 20 minutes were spent on this encounter with face-to-face time and non-face-to-face  time, including preparing to see the patient, counseling the patient and coordination of care as outlined above.    Thank you for allowing me to participate in the care of this patient.    Shanon Ace, PA-C Department of Hematology/Oncology [facility] at [facility] Phone: [phone]  [phone]    08/29/2023 11:42 AM

## 2023-09-06 ENCOUNTER — Other Ambulatory Visit: Payer: Self-pay | Admitting: Nurse Practitioner

## 2023-09-09 ENCOUNTER — Other Ambulatory Visit: Payer: Self-pay

## 2023-09-09 ENCOUNTER — Telehealth: Payer: Self-pay

## 2023-09-09 ENCOUNTER — Inpatient Hospital Stay: Payer: Medicare HMO | Attending: Internal Medicine | Admitting: Internal Medicine

## 2023-09-09 ENCOUNTER — Other Ambulatory Visit (HOSPITAL_COMMUNITY): Payer: Self-pay

## 2023-09-09 ENCOUNTER — Inpatient Hospital Stay: Payer: Medicare HMO

## 2023-09-09 ENCOUNTER — Telehealth: Payer: Self-pay | Admitting: Pharmacy Technician

## 2023-09-09 ENCOUNTER — Other Ambulatory Visit: Payer: Self-pay | Admitting: Internal Medicine

## 2023-09-09 VITALS — BP 124/74 | HR 68 | Temp 97.7°F | Resp 16 | Ht <= 58 in | Wt 116.3 lb

## 2023-09-09 DIAGNOSIS — Z79899 Other long term (current) drug therapy: Secondary | ICD-10-CM | POA: Insufficient documentation

## 2023-09-09 DIAGNOSIS — C3412 Malignant neoplasm of upper lobe, left bronchus or lung: Secondary | ICD-10-CM | POA: Diagnosis not present

## 2023-09-09 DIAGNOSIS — C7800 Secondary malignant neoplasm of unspecified lung: Secondary | ICD-10-CM | POA: Insufficient documentation

## 2023-09-09 DIAGNOSIS — C3490 Malignant neoplasm of unspecified part of unspecified bronchus or lung: Secondary | ICD-10-CM | POA: Diagnosis not present

## 2023-09-09 DIAGNOSIS — I639 Cerebral infarction, unspecified: Secondary | ICD-10-CM

## 2023-09-09 DIAGNOSIS — Z7952 Long term (current) use of systemic steroids: Secondary | ICD-10-CM | POA: Diagnosis not present

## 2023-09-09 DIAGNOSIS — C3492 Malignant neoplasm of unspecified part of left bronchus or lung: Secondary | ICD-10-CM

## 2023-09-09 DIAGNOSIS — R197 Diarrhea, unspecified: Secondary | ICD-10-CM | POA: Diagnosis not present

## 2023-09-09 LAB — CBC WITH DIFFERENTIAL (CANCER CENTER ONLY)
Abs Immature Granulocytes: 0.17 10*3/uL — ABNORMAL HIGH (ref 0.00–0.07)
Basophils Absolute: 0.1 10*3/uL (ref 0.0–0.1)
Basophils Relative: 0 %
Eosinophils Absolute: 0 10*3/uL (ref 0.0–0.5)
Eosinophils Relative: 0 %
HCT: 44.5 % (ref 36.0–46.0)
Hemoglobin: 14.5 g/dL (ref 12.0–15.0)
Immature Granulocytes: 1 %
Lymphocytes Relative: 12 %
Lymphs Abs: 2 10*3/uL (ref 0.7–4.0)
MCH: 30 pg (ref 26.0–34.0)
MCHC: 32.6 g/dL (ref 30.0–36.0)
MCV: 92.1 fL (ref 80.0–100.0)
Monocytes Absolute: 0.6 10*3/uL (ref 0.1–1.0)
Monocytes Relative: 4 %
Neutro Abs: 13.9 10*3/uL — ABNORMAL HIGH (ref 1.7–7.7)
Neutrophils Relative %: 83 %
Platelet Count: 250 10*3/uL (ref 150–400)
RBC: 4.83 MIL/uL (ref 3.87–5.11)
RDW: 12.3 % (ref 11.5–15.5)
WBC Count: 16.8 10*3/uL — ABNORMAL HIGH (ref 4.0–10.5)
nRBC: 0 % (ref 0.0–0.2)

## 2023-09-09 LAB — CMP (CANCER CENTER ONLY)
ALT: 14 U/L (ref 0–44)
AST: 13 U/L — ABNORMAL LOW (ref 15–41)
Albumin: 3.9 g/dL (ref 3.5–5.0)
Alkaline Phosphatase: 55 U/L (ref 38–126)
Anion gap: 5 (ref 5–15)
BUN: 29 mg/dL — ABNORMAL HIGH (ref 8–23)
CO2: 20 mmol/L — ABNORMAL LOW (ref 22–32)
Calcium: 9.8 mg/dL (ref 8.9–10.3)
Chloride: 114 mmol/L — ABNORMAL HIGH (ref 98–111)
Creatinine: 1.04 mg/dL — ABNORMAL HIGH (ref 0.44–1.00)
GFR, Estimated: 56 mL/min — ABNORMAL LOW (ref >60)
Glucose, Bld: 102 mg/dL — ABNORMAL HIGH (ref 70–99)
Potassium: 4.4 mmol/L (ref 3.5–5.1)
Sodium: 139 mmol/L (ref 135–145)
Total Bilirubin: 0.3 mg/dL (ref 0.3–1.2)
Total Protein: 6.2 g/dL — ABNORMAL LOW (ref 6.5–8.1)

## 2023-09-09 LAB — TSH: TSH: 5.39 u[IU]/mL — ABNORMAL HIGH (ref 0.350–4.500)

## 2023-09-09 MED ORDER — APIXABAN 2.5 MG PO TABS: 5.0000 mg | ORAL_TABLET | Freq: Two times a day (BID) | ORAL | 2 refills | Status: DC

## 2023-09-09 MED ORDER — ADAGRASIB 200 MG PO TABS
600.0000 mg | ORAL_TABLET | Freq: Two times a day (BID) | ORAL | 2 refills | Status: DC
  Filled 2023-09-16: qty 180, 30d supply, fill #0

## 2023-09-09 MED ORDER — APIXABAN 2.5 MG PO TABS
2.5000 mg | ORAL_TABLET | Freq: Two times a day (BID) | ORAL | 2 refills | Status: DC
Start: 2023-09-09 — End: 2023-10-15

## 2023-09-09 NOTE — Telephone Encounter (Signed)
Oral Oncology Patient Advocate Encounter  Prior Authorization for [facility] has been approved.    PA# [id number] Effective dates: 09/09/23 through 12/09/23  Patients co-pay is $2,008.90.    Jinger Neighbors, CPhT-Adv Oncology Pharmacy Patient Advocate [facility] Direct Number: [phone]  Fax: [phone]

## 2023-09-09 NOTE — Progress Notes (Signed)
[facility] [phone]   [phone]  OFFICE PROGRESS NOTE  [name], MD [address] [place] [address]  DIAGNOSIS: Stage IV (T1c, N3, M1 C) non-small cell lung cancer, adenocarcinoma presented with left upper lobe metastatic neoplasm presented with left lower lobe lung nodule in addition to extensive lymphadenopathy involving the neck, left hilar and mediastinal lymphadenopathy as well as upper abdomen lymph nodes and multiple subcutaneous/muscular nodules diagnosed in January 2024.   Molecular studies: KRASG12C   PD-L1 expression: 99%   PRIOR THERAPY: Status post 1 cycle of systemic chemotherapy with carboplatin for an AUC of 5 and Alimta 500 mg/m and Keytruda 200 mg IV every 3 weeks. First dose 12/30/2022.    CURRENT THERAPY: Starting from cycle #2 on 02/18/23 she has been on single agent Keytruda 200 mg IV every 3 weeks due to her recent stroke and physical debility. She is status post 8 cycles of treatment. Treatment on hold due to recent hospitalization for colitis in July 2024   INTERVAL HISTORY: Patricia [name] 77 y.o. female returns to the clinic today for follow-up visit accompanied by her sister De Nurse. Discussed the use of AI scribe software for clinical note transcription with the patient, who gave verbal consent to proceed.  History of Present Illness   The patient, a 77 year old diagnosed with stage four non-small cell lung cancer (adenocarcinoma) in January 2024, presents with a history of persistent diarrhea. The patient was found to have a KRAS mutation and a PDL1 change of 99%. Initial treatment involved a regimen of carboplatin, Alimta, and Keytruda, but was discontinued due to the patient's inability to tolerate it. The patient then received nine cycles of Keytruda alone, which led to severe diarrhea, occurring more than ten times a day. Despite the use of Imodium, the diarrhea persisted.  The patient  was subsequently started on steroids, initially with a Medrol Dosepak, which was ineffective in controlling the diarrhea. The patient was then switched to a higher dose of steroids, which successfully stopped the diarrhea. At the time of the consultation, the patient was on prednisone 10mg , which was initially started at 5mg  a day for five days, then increased to 40mg  for five days, 30mg  for five days, 20mg  for five days, and then reduced to 10mg  for five days.  The patient also reported receiving medication from a gastroenterologist, which helped stop the diarrhea. The patient reported an improvement in diarrhea, occurring once a day, and denied any nausea or vomiting. The patient also reported some breathing difficulties and a stuffy nose, but denied any chest pain. The patient reported weight loss but denied any fever or chills.       MEDICAL HISTORY: Past Medical History:  Diagnosis Date   Depression    GERD (gastroesophageal reflux disease)    Hypercholesterolemia    IFG (impaired fasting glucose)    Nonintractable episodic headache 01/18/2023   Osteopenia    PONV (postoperative nausea and vomiting)    Stroke (HCC)     ALLERGIES:  is allergic to codeine.  MEDICATIONS:  Current Outpatient Medications  Medication Sig Dispense Refill   ALPRAZolam (XANAX) 0.5 MG tablet Take 1 tablet (0.5 mg total) by mouth at bedtime as needed for anxiety or sleep. TAKE (1) TABLET BY MOUTH AT BEDTIME AS NEEDED FOR SLEEP. 30 tablet 5   apixaban (ELIQUIS) 5 MG TABS tablet Take 1 tablet (5 mg total) by mouth 2 (two) times daily. 60 tablet  6   budesonide (ENTOCORT EC) 3 MG 24 hr capsule Take 2 capsules (6 mg total) by mouth daily. 60 capsule 3   butalbital-acetaminophen-caffeine (FIORICET) 50-325-40 MG tablet TAKE 1/2 TABLET BY MOUTH EVERY 6 (SIX) HOURS AS NEEDED FOR HEADACHE. 30 tablet 2   famotidine (PEPCID) 40 MG tablet Take 1 tablet (40 mg total) by mouth daily. 30 tablet 5   lansoprazole (PREVACID  SOLUTAB) 30 MG disintegrating tablet 1 qd 30 tablet 12   levothyroxine (SYNTHROID) 50 MCG tablet TAKE 1 TABLET BY MOUTH DAILY BEFORE BREAKFAST 90 tablet 1   Menthol-Methyl Salicylate (MUSCLE RUB) 10-15 % CREA Apply 1 Application topically 3 (three) times daily to bilateral calves 85 g 0   methylPREDNISolone (MEDROL DOSEPAK) 4 MG TBPK tablet Use as instructed 21 tablet 0   Multiple Vitamin (MULTIVITAMIN WITH MINERALS) TABS tablet Take 1 tablet by mouth daily.     ondansetron (ZOFRAN) 8 MG tablet Take 1 tablet (8 mg total) by mouth every 8 (eight) hours as needed for nausea or vomiting. 30 tablet 2   pantoprazole (PROTONIX) 40 MG tablet Take 1 tablet (40 mg total) by mouth daily. 30 tablet 1   potassium chloride SA (KLOR-CON M20) 20 MEQ tablet Take 2 tablets (40 mEq total) by mouth 2 (two) times daily. 270 tablet 1   predniSONE (DELTASONE) 10 MG tablet Take 5 tablets (50 mg total) by mouth daily with breakfast for 5 days, THEN 4 tablets (40 mg total) daily with breakfast for 5 days, THEN 3 tablets (30 mg total) daily with breakfast for 5 days, THEN 2 tablets (20 mg total) daily with breakfast for 5 days, THEN 1 tablet (10 mg total) daily with breakfast for 5 days. 75 tablet 0   prochlorperazine (COMPAZINE) 10 MG tablet Take 1 tablet (10 mg total) by mouth every 6 (six) hours as needed for nausea or vomiting. 30 tablet 0   rosuvastatin (CRESTOR) 20 MG tablet TAKE 1 TABLET BY MOUTH EVERY DAY 100 tablet 1   sertraline (ZOLOFT) 50 MG tablet Take 1 tablet (50 mg total) by mouth daily. 90 tablet 1   topiramate (TOPAMAX) 50 MG tablet Take 1 tablet (50 mg total) by mouth 2 (two) times daily. 60 tablet 2   No current facility-administered medications for this visit.    SURGICAL HISTORY:  Past Surgical History:  Procedure Laterality Date   ABDOMINAL HYSTERECTOMY     BIOPSY  06/24/2023   Procedure: BIOPSY;  Surgeon: Dolores Frame, MD;  [facility];  Service: Gastroenterology;;    CATARACT EXTRACTION W/PHACO  09/12/2011   Procedure: CATARACT EXTRACTION PHACO AND INTRAOCULAR LENS PLACEMENT (IOC);  Surgeon: Gemma Payor;  Location: [facility];  Service: Ophthalmology;  Laterality: Left;  CDE: 8.91   CATARACT EXTRACTION W/PHACO  11/18/2011   Procedure: CATARACT EXTRACTION PHACO AND INTRAOCULAR LENS PLACEMENT (IOC);  Surgeon: Gemma Payor;  Location: [facility];  Service: Ophthalmology;  Laterality: Right;  CDE:10.26   COLONOSCOPY  2007   Dr. Jena Gauss: internal hemorrhoids, single anal papilla poor prep.    COLONOSCOPY N/A 03/27/2017   Rourk: Normal exam.   COLONOSCOPY  06/24/2023   Procedure: COLONOSCOPY;  Surgeon: Marguerita Merles, Reuel Boom, MD;  [facility]: [facility];  Service: Gastroenterology;;   ECTOPIC PREGNANCY SURGERY     removal of right tube   ESOPHAGOGASTRODUODENOSCOPY (EGD) WITH PROPOFOL N/A 09/10/2021   Procedure: ESOPHAGOGASTRODUODENOSCOPY (EGD) WITH PROPOFOL;  Surgeon: Corbin Ade, MD;  [facility];  Service: Endoscopy;  Laterality: N/A;  8:15AM   MALONEY DILATION N/A 09/10/2021   Procedure: Elease Hashimoto DILATION;  Surgeon: Corbin Ade, MD;  [facility]: [facility];  Service: Endoscopy;  Laterality: N/A;   OVARIAN CYST REMOVAL     right   RECTOCELE REPAIR N/A 01/18/2014   Procedure: POSTERIOR REPAIR (RECTOCELE);  Surgeon: Tilda Burrow, MD;  Location: [facility];  Service: Gynecology;  Laterality: N/A;   VAGINAL HYSTERECTOMY N/A 01/18/2014   Procedure: HYSTERECTOMY VAGINAL;  Surgeon: Tilda Burrow, MD;  Location: [facility];  Service: Gynecology;  Laterality: N/A;    REVIEW OF SYSTEMS:  Constitutional: positive for fatigue Eyes: negative Ears, nose, mouth, throat, and face: negative Respiratory: negative Cardiovascular: negative Gastrointestinal: negative Genitourinary:negative Integument/breast: negative Hematologic/lymphatic: negative Musculoskeletal:negative Neurological: negative Behavioral/Psych: negative Endocrine:  negative Allergic/Immunologic: negative   PHYSICAL EXAMINATION: General appearance: alert, cooperative, fatigued, and no distress Head: Normocephalic, without obvious abnormality, atraumatic Neck: no adenopathy, no JVD, supple, symmetrical, trachea midline, and thyroid not enlarged, symmetric, no tenderness/mass/nodules Lymph nodes: Cervical, supraclavicular, and axillary nodes normal. Resp: clear to auscultation bilaterally Back: symmetric, no curvature. ROM normal. No CVA tenderness. Cardio: regular rate and rhythm, S1, S2 normal, no murmur, click, rub or gallop GI: soft, non-tender; bowel sounds normal; no masses,  no organomegaly Extremities: extremities normal, atraumatic, no cyanosis or edema Neurologic: Alert and oriented X 3, normal strength and tone. Normal symmetric reflexes. Normal coordination and gait  ECOG PERFORMANCE STATUS: 1 - Symptomatic but completely ambulatory  Blood pressure 124/74, pulse 68, temperature 97.7 F (36.5 C), temperature source Oral, resp. rate 16, height 4' 8.5" (1.435 m), weight 116 lb 4.8 oz (52.8 kg), SpO2 99%.  LABORATORY DATA: Lab Results  Component Value Date   WBC 16.8 (H) 09/09/2023   HGB 14.5 09/09/2023   HCT 44.5 09/09/2023   MCV 92.1 09/09/2023   PLT 250 09/09/2023      Chemistry      Component Value Date/Time   NA 141 08/29/2023 0829   NA 139 08/02/2022 1112   K 3.5 08/29/2023 0829   CL 113 (H) 08/29/2023 0829   CO2 23 08/29/2023 0829   BUN 13 08/29/2023 0829   BUN 16 08/02/2022 1112   CREATININE 0.82 08/29/2023 0829      Component Value Date/Time   CALCIUM 9.4 08/29/2023 0829   ALKPHOS 63 08/29/2023 0829   AST 13 (L) 08/29/2023 0829   ALT 18 08/29/2023 0829   BILITOT 0.2 (L) 08/29/2023 0829       RADIOGRAPHIC STUDIES: No results found.  ASSESSMENT AND PLAN: This is a very pleasant 77 years old white female with a stage IV non-small cell lung cancer, adenocarcinoma diagnosed in January 2024 with positive KRAS G12C  mutation and PD-L1 expression of 99%. The patient started systemic chemotherapy initially with carboplatin, Alimta and Keytruda but this was discontinued after she had stroke with significant morbidities. She is currently undergoing treatment with single agent Keytruda 200 Mg IV every 3 weeks status post 9 cycles. She has a rough time tolerating this treatment with the severe grade 4 diarrhea. Assessment and Plan    Stage 4 Non-Small Cell Lung Cancer (NSCLC), Adenocarcinoma Diagnosed in January 2024 with KRAS G12C mutation and PD-L1 99%. Initially treated with Carboplatin, Alimta, and Keytruda, but had to discontinue due to side effects. Subsequently treated with Keytruda monotherapy, but developed severe diarrhea. -Discontinue Keytruda due to immune-mediated diarrhea. -Plan to start second-line treatment with Krazati 600mg  twice daily, after confirming no cardiac issues with EKG. -Order CT scan of  chest, abdomen, and pelvis to assess current state of cancer before starting Krazati.  Immune-mediated Diarrhea Severe diarrhea (>10 times/day) likely secondary to [facility]. Partially controlled with high-dose Prednisone. -Continue tapering Prednisone as per current regimen. -Follow up with gastroenterologist as needed.  Follow-up -Pharmacist to provide education on [place]. -Return in 1 week for review of CT scan results and further management.   Because of the potential interaction of Krazati (Adagrasib) with Eliquis and potential increase in the concentration of Eliquis, we will reduce the dose of Eliquis to 50%. The patient was advised to call immediately if she has any concerning symptoms in the interval. The patient voices understanding of current disease status and treatment options and is in agreement with the current care plan.  All questions were answered. The patient knows to call the clinic with any problems, questions or concerns. We can certainly see the patient much sooner if  necessary.  The total time spent in the appointment was 30 minutes.  Disclaimer: This note was dictated with voice recognition software. Similar sounding words can inadvertently be transcribed and may not be corrected upon review.

## 2023-09-09 NOTE — Telephone Encounter (Signed)
Oral Oncology Patient Advocate Encounter   Began application for assistance for Krazati through [employer].   Application will be submitted upon completion of necessary supporting documentation.   BMS Access Support phone number [phone].   I will continue to check the status until final determination.   Jinger Neighbors, CPhT-Adv Oncology Pharmacy Patient Advocate [facility] Direct Number: [phone]  Fax: [phone]

## 2023-09-09 NOTE — Telephone Encounter (Signed)
Oral Oncology Patient Advocate Encounter   Received notification that prior authorization for Caryn Section is required.   PA submitted on 09/09/23 Key [id number] Status is pending     Jinger Neighbors, CPhT-Adv Oncology Pharmacy Patient Advocate [facility] Direct Number: [phone]  Fax: [phone]

## 2023-09-09 NOTE — Telephone Encounter (Signed)
Oral Oncology Patient Advocate Encounter  Spoke with patient's husband, Clifton Custard, and requested patient come by office to sign assistance paperwork. Provided Clifton Custard with my direct phone number in case F[place]aye has any questions or would like more details on why I am requesting signatures.  Per Clifton Custard, one or both of them will try to come by the office to sign before the end of this week.  Jinger Neighbors, CPhT-Adv Oncology Pharmacy Patient Advocate [facility] Direct Number: [phone]  Fax: [phone]

## 2023-09-09 NOTE — Progress Notes (Signed)
DISCONTINUE OFF PATHWAY REGIMEN - Non-Small Cell Lung   [id number]:Pembrolizumab 200 mg  IV D1 + Pemetrexed 500 mg/m2 IV D1 + Carboplatin AUC=5 IV D1 q21 Days:   A cycle is every 21 days:     Pembrolizumab      Pemetrexed      Carboplatin   **Always confirm dose/schedule in your pharmacy ordering system**  REASON: Toxicities / Adverse Event PRIOR TREATMENT: Off Pathway: Pembrolizumab 200 mg  IV D1 + Pemetrexed 500 mg/m2 IV D1 + Carboplatin AUC=5 IV D1 q21 Days TREATMENT RESPONSE: Stable Disease (SD)  START OFF PATHWAY REGIMEN - Non-Small Cell Lung   [id number]:Adagrasib 600 mg PO BID D1-28 q28 Days:   A cycle is every 28 days:     Adagrasib   **Always confirm dose/schedule in your pharmacy ordering system**  Patient Characteristics: Stage IV Metastatic, Nonsquamous, Molecular Analysis Completed, Molecular Alteration Present and Eligible for Molecular Targeted Therapy, Second Line - Molecular Targeted Therapy, KRAS G12C Mutation Positive Therapeutic Status: Stage IV Metastatic Histology: Nonsquamous Cell Broad Molecular Profiling Status: Molecular Analysis Completed Molecular Analysis Results: Alteration Present and Eligible for Molecular Targeted Therapy Molecular Alteration Present: KRAS G12C Mutation Positive Molecular Targeted Line of Therapy: Second Automotive engineer Targeted Therapy Intent of Therapy: Non-Curative / Palliative Intent, Discussed with Patient

## 2023-09-09 NOTE — Telephone Encounter (Signed)
Oral Oncology Patient Advocate Encounter  Electronic signatures completed and received. Application pending MD signatures.  Jinger Neighbors, CPhT-Adv Oncology Pharmacy Patient Advocate [facility] Direct Number: [phone]  Fax: [phone]

## 2023-09-09 NOTE — Telephone Encounter (Addendum)
Oral Oncology Pharmacist Encounter  Start date: pending patient assistance   I spoke with patient for overview of new oral chemotherapy medication: Caryn Section (adagrasib) for the treatment of metastatic non-small cell lung cancer, KRAS G12C-mutated, planned duration until disease progression or unacceptable drug toxicity.  Current medication list in Epic reviewed, DDIs with [place] identified: - xanax (cat X): krazati can increase concentration of xanax and it is recommended not to use together. Will touch base with husband to see how often patient is using xanax as it is an as needed medication.  - apixaban (cat D): krazati may increase the concentration of eliquis. In order to start the krazati, patient will need to decrease the eliquis dose by 50% to 2.5mg  BID. Patient notified and MD is in agreement with proceeding and lowering eliquis dose.  - budesonide (cat D) and methylprednisolone (cat C): krazati may increase the concentration of budesonide and methylprednisolone. Patient is on medications for significant diarrhea with prior therapy. Will decrease dosing if patients diarrhea becomes controlled.  - sertraline (Cat C): krazati and sertraline may increase patients qtc prolongation. Patient will get regular ekgs while on therapy.   Counseled patient on administration, dosing, side effects, monitoring, drug-food interactions, safe handling, storage, and disposal.   Patient will take Krazati 200 mg tablets, 3 tablets (600 mg total) by mouth 2 (two) times daily. Recommended patient take Caryn Section with food to decrease GI upset.  Patient knows to avoid grapefruit and grapefruit juice while on Krazati.   Side effects include but are not limited to: diarrhea, nausea/vomiting, hepatotoxicity, edema, fatigue, decrease in blood counts. Also reviewed rare but serious side effect of Qtc prolongation as well as pneumonitis that have been reported with use of medication.   Patient has anti-emetic on hand and  knows to take it if nausea develops.   Patient has anti diarrheal and alert the office of 4 or more loose stools above baseline.    Reviewed with patient importance of keeping a medication schedule and plan for any missed doses.  Labs from 09/09/23 (CBC, CMP) assessed, no interventions needed. Pending EKG. Prescription dose and frequency assessed for appropriateness.    After discussion with patient no patient barriers to medication adherence identified.    Insurance authorization for Caryn Section has been obtained. Pending patient assistance program.   All questions answered.  Patient voiced understanding and appreciation.   Patient agreement for treatment documented in MD note on 09/09/2023.  Prescription has been e-scribed to the [facility] for benefits analysis and approval.  Oral Oncology Clinic will continue to follow for insurance authorization, copayment issues, and start date.  Medication education handout given to patient. Patient knows to call the office with questions or concerns. Oral Chemotherapy Clinic phone number provided to patient.  Bethel Born, PharmD Hematology/Oncology Clinical Pharmacist [facility] Chemotherapy [facility] [phone] 09/09/2023 9:57 AM

## 2023-09-09 NOTE — Telephone Encounter (Signed)
Oral Oncology Patient Advocate Encounter  Reached out and spoke with patient regarding PAP paperwork, explained that I would send it to their preferred email via [employer].   Confirmed email address:  [email or web address]            Patient expressed understanding and consent.  Will follow up once paperwork has been signed and returned.   Jinger Neighbors, CPhT-Adv Oncology Pharmacy Patient Advocate [facility] Direct Number: [phone]  Fax: [phone]

## 2023-09-11 ENCOUNTER — Other Ambulatory Visit: Payer: Self-pay | Admitting: Family Medicine

## 2023-09-12 ENCOUNTER — Other Ambulatory Visit (HOSPITAL_COMMUNITY): Payer: Self-pay

## 2023-09-12 NOTE — Telephone Encounter (Signed)
Oral Oncology Patient Advocate Encounter   Submitted application for assistance for Caryn Section to [employer].   Application submitted via e-fax to [phone]   BMS Access Support phone number [phone].   I will continue to check the status until final determination.   Jinger Neighbors, CPhT-Adv Oncology Pharmacy Patient Advocate [facility] Direct Number: [phone]  Fax: [phone]

## 2023-09-12 NOTE — Telephone Encounter (Signed)
Oral Oncology Patient Advocate Encounter  Received BIV report from BMS Access Support for [place]. Called program to discuss results. Program requires test claim showing patient's cost be sent to them as the insurance could not disclose an amount to them directly.  Test claim information e-faxed to [facility] Access Support [phone]  Jinger Neighbors, CPhT-Adv Oncology Pharmacy Patient Advocate [facility] Direct Number: [phone]  Fax: [phone]

## 2023-09-13 NOTE — Telephone Encounter (Signed)
Valsartan is not on her med list I would not recommend refilling valsartan.  Please cancel at the pharmacy if the patient states she is still on that she needs to connect with [name] directly then the alprazolam you could forwarded to me for renewal

## 2023-09-15 ENCOUNTER — Ambulatory Visit (HOSPITAL_COMMUNITY)
Admission: RE | Admit: 2023-09-15 | Discharge: 2023-09-15 | Disposition: A | Discharge status: Home / Self Care | Payer: Medicare HMO | Source: Ambulatory Visit | Attending: Internal Medicine | Admitting: Internal Medicine

## 2023-09-15 ENCOUNTER — Other Ambulatory Visit: Payer: Self-pay | Admitting: Internal Medicine

## 2023-09-15 DIAGNOSIS — C3490 Malignant neoplasm of unspecified part of unspecified bronchus or lung: Secondary | ICD-10-CM | POA: Diagnosis not present

## 2023-09-15 DIAGNOSIS — K449 Diaphragmatic hernia without obstruction or gangrene: Secondary | ICD-10-CM | POA: Diagnosis not present

## 2023-09-15 DIAGNOSIS — C3492 Malignant neoplasm of unspecified part of left bronchus or lung: Secondary | ICD-10-CM

## 2023-09-15 DIAGNOSIS — I639 Cerebral infarction, unspecified: Secondary | ICD-10-CM

## 2023-09-15 MED ORDER — IOHEXOL 300 MG/ML  SOLN
100.0000 mL | Freq: Once | INTRAMUSCULAR | Status: AC | PRN
  Administered 2023-09-15: 80 mL via INTRAVENOUS

## 2023-09-15 NOTE — Telephone Encounter (Signed)
Oral Oncology Patient Advocate Encounter  Received call back from BMS Access Support. Patient case has been triaged to [facility]  BMSPAF phone [phone]  Jinger Neighbors, CPhT-Adv Oncology Pharmacy Patient Advocate [facility] Direct Number: [phone]  Fax: [phone]

## 2023-09-15 NOTE — Telephone Encounter (Signed)
Oral Oncology Patient Advocate Encounter  Per representative, Mick Sell, application is in final stage before being referred to [facility].  I will continue to check back until final determination.  Jinger Neighbors, CPhT-Adv Oncology Pharmacy Patient Advocate [facility] Direct Number: [phone]  Fax: [phone]

## 2023-09-16 ENCOUNTER — Other Ambulatory Visit: Payer: Self-pay

## 2023-09-17 ENCOUNTER — Ambulatory Visit: Payer: Medicare HMO | Admitting: Neurology

## 2023-09-17 ENCOUNTER — Other Ambulatory Visit: Payer: Self-pay | Admitting: Family Medicine

## 2023-09-17 ENCOUNTER — Encounter: Payer: Self-pay | Admitting: Neurology

## 2023-09-17 NOTE — Telephone Encounter (Signed)
Refill on  ALPRAZolam (XANAX) 0.5 MG tablet  [facility] to [facility] 

## 2023-09-18 MED ORDER — ALPRAZOLAM 0.5 MG PO TABS: 0.5000 mg | ORAL_TABLET | Freq: Every evening | ORAL | 2 refills | Status: DC | PRN

## 2023-09-18 NOTE — Telephone Encounter (Signed)
Oral Oncology Patient Advocate Encounter  Submitted hardcopy rx via e-fax [phone]  Jinger Neighbors, CPhT-Adv Oncology Pharmacy Patient Advocate [facility] Direct Number: [phone]  Fax: [phone]

## 2023-09-18 NOTE — Telephone Encounter (Signed)
Refill on xanax send to [facility] [place]

## 2023-09-22 ENCOUNTER — Other Ambulatory Visit (HOSPITAL_COMMUNITY): Payer: Self-pay

## 2023-09-22 NOTE — Telephone Encounter (Signed)
Oral Oncology Patient Advocate Encounter  Received letter from [facility] stating patient could not be enrolled in PAP at this time as there was not documentation that she had spent 3% of the annual household income on prescription medications since 12/09/22.  I have spoken with the patient's husband, Clifton Custard, who is going to see if they are able to produce proof that the family has spent at least $306 on this type of expense as requested by the program.  I will continue to update until final determination.  Jinger Neighbors, CPhT-Adv Oncology Pharmacy Patient Advocate [facility] Direct Number: [phone]  Fax: [phone]

## 2023-09-23 ENCOUNTER — Encounter: Payer: Self-pay | Admitting: Internal Medicine

## 2023-09-23 ENCOUNTER — Other Ambulatory Visit: Payer: Self-pay

## 2023-09-23 NOTE — Telephone Encounter (Signed)
Oral Oncology Patient Advocate Encounter   Received notification that the application for assistance for Krazati through [facility] has been approved.   BMSPAF phone number [phone].   Effective dates: 09/23/23 through 12/09/23  Medication will be filled at [facility].  I have spoken to the patient.  Jinger Neighbors, CPhT-Adv Oncology Pharmacy Patient Advocate [facility] Direct Number: [phone]  Fax: [phone]

## 2023-09-23 NOTE — Telephone Encounter (Signed)
Oral Oncology Patient Advocate Encounter  Receipts provided by patient's husband showing pharmacy spending since 12/09/22 have been faxed to BMSPAF.  Jinger Neighbors, CPhT-Adv Oncology Pharmacy Patient Advocate [facility] Direct Number: [phone]  Fax: [phone]

## 2023-09-26 NOTE — Telephone Encounter (Signed)
Oral Chemotherapy Pharmacist Encounter   Called and spoke to patient's husband, Akeia Hawkey, regarding Caryn Section as he is patient's primary assistance regarding her medications.   Mr. Wayson stated the Caryn Section is set to be delivered to their home today. Will plan for Ms. Musser to start [place], 09/26/23 PM.  Reminded Mr. Ortlip that Adeleine will need to decrease Eliquis dose to 2.5 mg BID while she is on [place]. Per Mr. Labbe she is already taking the reduced dose.   No other questions or concerns at this time. Patient's husband expressed understanding. Patient's husband knows to call the office with questions or concerns.  Lenord Carbo, PharmD, BCPS, [facility] Hematology/Oncology Clinical Pharmacist [facility] Oral Chemotherapy [facility] [phone] 09/26/2023 10:39 AM

## 2023-09-29 ENCOUNTER — Inpatient Hospital Stay: Payer: Medicare HMO | Admitting: Internal Medicine

## 2023-09-29 ENCOUNTER — Telehealth: Payer: Self-pay | Admitting: Pharmacy Technician

## 2023-09-29 ENCOUNTER — Inpatient Hospital Stay: Payer: Medicare HMO

## 2023-09-29 ENCOUNTER — Other Ambulatory Visit (HOSPITAL_COMMUNITY): Payer: Self-pay

## 2023-09-29 VITALS — BP 131/80 | HR 65 | Temp 97.7°F | Resp 16 | Ht <= 58 in | Wt 113.4 lb

## 2023-09-29 DIAGNOSIS — Z79899 Other long term (current) drug therapy: Secondary | ICD-10-CM | POA: Diagnosis not present

## 2023-09-29 DIAGNOSIS — Z7952 Long term (current) use of systemic steroids: Secondary | ICD-10-CM | POA: Diagnosis not present

## 2023-09-29 DIAGNOSIS — C7800 Secondary malignant neoplasm of unspecified lung: Secondary | ICD-10-CM | POA: Diagnosis not present

## 2023-09-29 DIAGNOSIS — C3492 Malignant neoplasm of unspecified part of left bronchus or lung: Secondary | ICD-10-CM | POA: Diagnosis not present

## 2023-09-29 DIAGNOSIS — R197 Diarrhea, unspecified: Secondary | ICD-10-CM | POA: Diagnosis not present

## 2023-09-29 DIAGNOSIS — C3412 Malignant neoplasm of upper lobe, left bronchus or lung: Secondary | ICD-10-CM | POA: Diagnosis not present

## 2023-09-29 LAB — CBC WITH DIFFERENTIAL (CANCER CENTER ONLY)
Abs Immature Granulocytes: 0.07 10*3/uL (ref 0.00–0.07)
Basophils Absolute: 0 10*3/uL (ref 0.0–0.1)
Basophils Relative: 0 %
Eosinophils Absolute: 0 10*3/uL (ref 0.0–0.5)
Eosinophils Relative: 0 %
HCT: 42.5 % (ref 36.0–46.0)
Hemoglobin: 14.2 g/dL (ref 12.0–15.0)
Immature Granulocytes: 1 %
Lymphocytes Relative: 11 %
Lymphs Abs: 1.3 10*3/uL (ref 0.7–4.0)
MCH: 29.6 pg (ref 26.0–34.0)
MCHC: 33.4 g/dL (ref 30.0–36.0)
MCV: 88.7 fL (ref 80.0–100.0)
Monocytes Absolute: 0.6 10*3/uL (ref 0.1–1.0)
Monocytes Relative: 5 %
Neutro Abs: 9.6 10*3/uL — ABNORMAL HIGH (ref 1.7–7.7)
Neutrophils Relative %: 83 %
Platelet Count: 215 10*3/uL (ref 150–400)
RBC: 4.79 MIL/uL (ref 3.87–5.11)
RDW: 12.5 % (ref 11.5–15.5)
WBC Count: 11.6 10*3/uL — ABNORMAL HIGH (ref 4.0–10.5)
nRBC: 0 % (ref 0.0–0.2)

## 2023-09-29 LAB — TSH: TSH: 4.348 u[IU]/mL (ref 0.350–4.500)

## 2023-09-29 LAB — CMP (CANCER CENTER ONLY)
ALT: 13 U/L (ref 0–44)
AST: 13 U/L — ABNORMAL LOW (ref 15–41)
Albumin: 4 g/dL (ref 3.5–5.0)
Alkaline Phosphatase: 48 U/L (ref 38–126)
Anion gap: 8 (ref 5–15)
BUN: 31 mg/dL — ABNORMAL HIGH (ref 8–23)
CO2: 18 mmol/L — ABNORMAL LOW (ref 22–32)
Calcium: 9.8 mg/dL (ref 8.9–10.3)
Chloride: 113 mmol/L — ABNORMAL HIGH (ref 98–111)
Creatinine: 1.2 mg/dL — ABNORMAL HIGH (ref 0.44–1.00)
GFR, Estimated: 47 mL/min — ABNORMAL LOW (ref >60)
Glucose, Bld: 125 mg/dL — ABNORMAL HIGH (ref 70–99)
Potassium: 3.4 mmol/L — ABNORMAL LOW (ref 3.5–5.1)
Sodium: 139 mmol/L (ref 135–145)
Total Bilirubin: 0.3 mg/dL (ref 0.3–1.2)
Total Protein: 6 g/dL — ABNORMAL LOW (ref 6.5–8.1)

## 2023-09-29 MED ORDER — ONDANSETRON HCL 8 MG PO TABS: 4.0000 mg | ORAL_TABLET | Freq: Three times a day (TID) | ORAL | 0 refills | Status: DC | PRN

## 2023-09-29 NOTE — Progress Notes (Signed)
[facility] [phone]   [phone]  OFFICE PROGRESS NOTE  [name], MD [address] [place] [address]  DIAGNOSIS: Stage IV (T1c, N3, M1 C) non-small cell lung cancer, adenocarcinoma presented with left upper lobe metastatic neoplasm presented with left lower lobe lung nodule in addition to extensive lymphadenopathy involving the neck, left hilar and mediastinal lymphadenopathy as well as upper abdomen lymph nodes and multiple subcutaneous/muscular nodules diagnosed in January 2024.   Molecular studies: KRASG12C   PD-L1 expression: 99%   PRIOR THERAPY:  1) Status post 1 cycle of systemic chemotherapy with carboplatin for an AUC of 5 and Alimta 500 mg/m and Keytruda 200 mg IV every 3 weeks. First dose 12/30/2022 2)  Starting from cycle #2 on 02/18/23 she has been on single agent Keytruda 200 mg IV every 3 weeks due to her recent stroke and physical debility. She is status post 8 cycles of treatment. Treatment on hold due to recent hospitalization for colitis in July 2024 .    CURRENT THERAPY: Krazati (Adagrasib) 600 mg p.o. twice daily started September 26, 2023.  INTERVAL HISTORY: Patricia [name] 77 y.o. female returns to the clinic today for follow-up visit accompanied by her sister De Nurse.Discussed the use of AI scribe software for clinical note transcription with the patient, who gave verbal consent to proceed.  History of Present Illness   Patricia [name], a 77 year old patient with a diagnosis of stage four non-small cell lung cancer adenocarcinoma (adenocarcinoma), was started on a new targeted agent, Krazati, three days prior to the consultation. The patient reports experiencing vomiting since starting the medication, despite taking anti-nausea medication before the [place]. The specific anti-nausea medication used is not specified, but it is taken every eight hours.  In addition to vomiting, the patient reports  recent weight loss, with a loss of two to three pounds since the last consultation. However, the patient denies experiencing diarrhea or any other side effects from the [place].  The patient's blood work is reported as "reasonably okay" with some expected abnormalities due to the treatment.        MEDICAL HISTORY: Past Medical History:  Diagnosis Date   Depression    GERD (gastroesophageal reflux disease)    Hypercholesterolemia    IFG (impaired fasting glucose)    Nonintractable episodic headache 01/18/2023   Osteopenia    PONV (postoperative nausea and vomiting)    Stroke (HCC)     ALLERGIES:  is allergic to codeine.  MEDICATIONS:  Current Outpatient Medications  Medication Sig Dispense Refill   adagrasib (KRAZATI) 200 MG tablet Take 3 tablets (600 mg total) by mouth 2 (two) times daily. 180 tablet 2   ALPRAZolam (XANAX) 0.5 MG tablet Take 1 tablet (0.5 mg total) by mouth at bedtime as needed for anxiety or sleep. TAKE (1) TABLET BY MOUTH AT BEDTIME AS NEEDED FOR SLEEP. 30 tablet 2   apixaban (ELIQUIS) 2.5 MG TABS tablet Take 1 tablet (2.5 mg total) by mouth 2 (two) times daily. 60 tablet 2   budesonide (ENTOCORT EC) 3 MG 24 hr capsule Take 2 capsules (6 mg total) by mouth daily. 60 capsule 3   butalbital-acetaminophen-caffeine (FIORICET) 50-325-40 MG tablet TAKE 1/2 TABLET BY MOUTH EVERY 6 (SIX) HOURS AS NEEDED FOR HEADACHE. 30 tablet 2   famotidine (PEPCID) 40 MG tablet Take 1 tablet (40 mg total) by mouth daily. 30 tablet 5   lansoprazole (PREVACID SOLUTAB) 30 MG disintegrating tablet 1  qd 30 tablet 12   levothyroxine (SYNTHROID) 50 MCG tablet TAKE 1 TABLET BY MOUTH DAILY BEFORE BREAKFAST 90 tablet 1   Menthol-Methyl Salicylate (MUSCLE RUB) 10-15 % CREA Apply 1 Application topically 3 (three) times daily to bilateral calves 85 g 0   methylPREDNISolone (MEDROL DOSEPAK) 4 MG TBPK tablet Use as instructed 21 tablet 0   Multiple Vitamin (MULTIVITAMIN WITH MINERALS) TABS tablet  Take 1 tablet by mouth daily.     pantoprazole (PROTONIX) 40 MG tablet Take 1 tablet (40 mg total) by mouth daily. 30 tablet 1   potassium chloride SA (KLOR-CON M20) 20 MEQ tablet Take 2 tablets (40 mEq total) by mouth 2 (two) times daily. 270 tablet 1   prochlorperazine (COMPAZINE) 10 MG tablet Take 1 tablet (10 mg total) by mouth every 6 (six) hours as needed for nausea or vomiting. 30 tablet 0   rosuvastatin (CRESTOR) 20 MG tablet TAKE 1 TABLET BY MOUTH EVERY DAY 100 tablet 1   sertraline (ZOLOFT) 50 MG tablet Take 1 tablet (50 mg total) by mouth daily. 90 tablet 1   topiramate (TOPAMAX) 50 MG tablet Take 1 tablet (50 mg total) by mouth 2 (two) times daily. 60 tablet 2   No current facility-administered medications for this visit.    SURGICAL HISTORY:  Past Surgical History:  Procedure Laterality Date   ABDOMINAL HYSTERECTOMY     BIOPSY  06/24/2023   Procedure: BIOPSY;  Surgeon: Dolores Frame, MD;  Location: [facility];  Service: Gastroenterology;;   CATARACT EXTRACTION W/PHACO  09/12/2011   Procedure: CATARACT EXTRACTION PHACO AND INTRAOCULAR LENS PLACEMENT (IOC);  Surgeon: Gemma Payor;  Location: [facility];  Service: Ophthalmology;  Laterality: Left;  CDE: 8.91   CATARACT EXTRACTION W/PHACO  11/18/2011   Procedure: CATARACT EXTRACTION PHACO AND INTRAOCULAR LENS PLACEMENT (IOC);  Surgeon: Gemma Payor;  Location: [facility];  Service: Ophthalmology;  Laterality: Right;  CDE:10.26   COLONOSCOPY  2007   Dr. Jena Gauss: internal hemorrhoids, single anal papilla poor prep.    COLONOSCOPY N/A 03/27/2017   Rourk: Normal exam.   COLONOSCOPY  06/24/2023   Procedure: COLONOSCOPY;  Surgeon: Marguerita Merles, Reuel Boom, MD;  Location: [facility];  Service: Gastroenterology;;   ECTOPIC PREGNANCY SURGERY     removal of right tube   ESOPHAGOGASTRODUODENOSCOPY (EGD) WITH PROPOFOL N/A 09/10/2021   Procedure: ESOPHAGOGASTRODUODENOSCOPY (EGD) WITH PROPOFOL;  Surgeon: Corbin Ade, MD;  Location: [facility];  Service: Endoscopy;  Laterality: N/A;  8:15AM   MALONEY DILATION N/A 09/10/2021   Procedure: Elease Hashimoto DILATION;  Surgeon: Corbin Ade, MD;  Location: [facility];  Service: Endoscopy;  Laterality: N/A;   OVARIAN CYST REMOVAL     right   RECTOCELE REPAIR N/A 01/18/2014   Procedure: POSTERIOR REPAIR (RECTOCELE);  Surgeon: Tilda Burrow, MD;  Location: [facility];  Service: Gynecology;  Laterality: N/A;   VAGINAL HYSTERECTOMY N/A 01/18/2014   Procedure: HYSTERECTOMY VAGINAL;  Surgeon: Tilda Burrow, MD;  Location: [facility];  Service: Gynecology;  Laterality: N/A;    REVIEW OF SYSTEMS:  Constitutional: positive for fatigue Eyes: negative Ears, nose, mouth, throat, and face: negative Respiratory: negative Cardiovascular: negative Gastrointestinal: positive for nausea Genitourinary:negative Integument/breast: negative Hematologic/lymphatic: negative Musculoskeletal:negative Neurological: negative Behavioral/Psych: negative Endocrine: negative Allergic/Immunologic: negative   PHYSICAL EXAMINATION: General appearance: alert, cooperative, fatigued, and no distress Head: Normocephalic, without obvious abnormality, atraumatic Neck: no adenopathy, no JVD, supple, symmetrical, trachea midline, and thyroid not enlarged, symmetric, no tenderness/mass/nodules Lymph nodes: Cervical, supraclavicular, and  axillary nodes normal. Resp: clear to auscultation bilaterally Back: symmetric, no curvature. ROM normal. No CVA tenderness. Cardio: regular rate and rhythm, S1, S2 normal, no murmur, click, rub or gallop GI: soft, non-tender; bowel sounds normal; no masses,  no organomegaly Extremities: extremities normal, atraumatic, no cyanosis or edema Neurologic: Alert and oriented X 3, normal strength and tone. Normal symmetric reflexes. Normal coordination and gait  ECOG PERFORMANCE STATUS: 1 - Symptomatic but completely ambulatory  Blood pressure 131/80, pulse 65, temperature 97.7 F (36.5  C), temperature source Oral, resp. rate 16, height 4' 8.5" (1.435 m), weight 113 lb 6.4 oz (51.4 kg), SpO2 97%.  LABORATORY DATA: Lab Results  Component Value Date   WBC 11.6 (H) 09/29/2023   HGB 14.2 09/29/2023   HCT 42.5 09/29/2023   MCV 88.7 09/29/2023   PLT 215 09/29/2023      Chemistry      Component Value Date/Time   NA 139 09/29/2023 0836   NA 139 08/02/2022 1112   K 3.4 (L) 09/29/2023 0836   CL 113 (H) 09/29/2023 0836   CO2 18 (L) 09/29/2023 0836   BUN 31 (H) 09/29/2023 0836   BUN 16 08/02/2022 1112   CREATININE 1.20 (H) 09/29/2023 0836      Component Value Date/Time   CALCIUM 9.8 09/29/2023 0836   ALKPHOS 48 09/29/2023 0836   AST 13 (L) 09/29/2023 0836   ALT 13 09/29/2023 0836   BILITOT 0.3 09/29/2023 0836       RADIOGRAPHIC STUDIES: CT CHEST ABDOMEN PELVIS W CONTRAST  Result Date: 09/24/2023 CLINICAL DATA:  Non-small cell lung cancer staging. * Tracking Code: BO * EXAM: CT CHEST, ABDOMEN, AND PELVIS WITH CONTRAST TECHNIQUE: Multidetector CT imaging of the chest, abdomen and pelvis was performed following the standard protocol during bolus administration of intravenous contrast. RADIATION DOSE REDUCTION: This exam was performed according to the departmental dose-optimization program which includes automated exposure control, adjustment of the mA and/or kV according to patient size and/or use of iterative reconstruction technique. CONTRAST:  80mL OMNIPAQUE IOHEXOL 300 MG/ML  SOLN COMPARISON:  CT 07/22/2023 FINDINGS: CT CHEST FINDINGS Cardiovascular: No significant vascular findings. Normal heart size. No pericardial effusion. Mediastinum/Nodes: No axillary or supraclavicular adenopathy. No mediastinal or hilar adenopathy. No pericardial fluid. Esophagus normal. Lungs/Pleura: 9 mm nodule in the LEFT lower lobe (image 73/4) is unchanged from 10 mm comparison exam. Previous described ground-glass nodule in the RIGHT upper lobe is less conspicuous (image 50/4). No new  pulmonary nodules. Musculoskeletal: No aggressive osseous lesion. CT ABDOMEN AND PELVIS FINDINGS Hepatobiliary: No focal hepatic lesion. No biliary ductal dilatation. Gallbladder is normal. Common bile duct is normal. Pancreas: Pancreas is normal. No ductal dilatation. No pancreatic inflammation. Spleen: Normal spleen Adrenals/urinary tract: Adrenal glands and kidneys are normal. The ureters and bladder normal. Stomach/Bowel: Large hiatal hernia. Stomach, small bowel normal. The colon and rectosigmoid colon are normal. Vascular/Lymphatic: Abdominal aorta is normal caliber with atherosclerotic calcification. There is no retroperitoneal or periportal lymphadenopathy. No pelvic lymphadenopathy. Reproductive: Unremarkable Other: No free fluid. Musculoskeletal: Sclerotic lesion in the RIGHT sacral ala not changed from prior measures 6 mm. No aggressive osseous lesions identified. IMPRESSION: CHEST: 1. Stable LEFT lower lobe pulmonary 9 mm nodule. 2. No new pulmonary nodules. 3. No thoracic adenopathy. PELVIS: 1. No evidence of metastatic disease in the abdomen pelvis. 2. Stable benign-appearing sclerotic lesion in the RIGHT sacral ala. 3. Large hiatal hernia. Electronically Signed   By: Genevive Bi M.D.   On: 09/24/2023 12:12  ASSESSMENT AND PLAN: This is a very pleasant 77 years old white female with a stage IV non-small cell lung cancer, adenocarcinoma diagnosed in January 2024 with positive KRAS G12C mutation and PD-L1 expression of 99%. The patient started systemic chemotherapy initially with carboplatin, Alimta and Keytruda but this was discontinued after she had stroke with significant morbidities. She is currently undergoing treatment with single agent Keytruda 200 Mg IV every 3 weeks status post 9 cycles. She has a rough time tolerating this treatment with the severe grade 4 diarrhea. She started treatment with Caryn Section (Adagrasib) 600 mg p.o. twice daily on 09/26/2023.    Stage 4 Non-Small Cell Lung  Cancer (Adenocarcinoma) with KRAS G12C mutation Recently started on Krazati with significant nausea and vomiting. No diarrhea reported. Some weight loss noted. Blood work stable. -Continue Krazati as tolerated. -Pre-medicate with Zofran 30 minutes prior to [place] dose. -Refill Zofran at [facility]. -Encourage increased fluid intake, including electrolyte-rich fluids like Pedialyte. -Return in 2 weeks for follow-up and repeat blood work.     Because of the potential interaction of Krazati (Adagrasib) with Eliquis and potential increase in the concentration of Eliquis, we will reduce the dose of Eliquis to 50%. The patient was advised to call immediately if she has any other concerning symptoms in the interval. The patient voices understanding of current disease status and treatment options and is in agreement with the current care plan.  All questions were answered. The patient knows to call the clinic with any problems, questions or concerns. We can certainly see the patient much sooner if necessary.  The total time spent in the appointment was 30 minutes.  Disclaimer: This note was dictated with voice recognition software. Similar sounding words can inadvertently be transcribed and may not be corrected upon review.

## 2023-09-29 NOTE — Telephone Encounter (Signed)
Oral Oncology Patient Advocate Encounter   Was successful in securing patient a $5,000 grant from [employer] ([employer]) to provide copayment coverage for [place].  This will keep the out of pocket expense at $0.     I have spoken with the patient.    The billing information is as follows and has been shared with [facility].   RxBin: [id number] PCN:  [id number] Member ID: [id number] Group ID: [id number] Dates of Eligibility: 09/29/23 through 09/28/24  Jinger Neighbors, CPhT-Adv Oncology Pharmacy Patient Advocate [facility] Direct Number: [phone]  Fax: [phone]

## 2023-10-01 ENCOUNTER — Other Ambulatory Visit: Payer: Self-pay

## 2023-10-10 ENCOUNTER — Other Ambulatory Visit: Payer: Self-pay | Admitting: Internal Medicine

## 2023-10-13 ENCOUNTER — Emergency Department (HOSPITAL_COMMUNITY): Payer: Medicare HMO

## 2023-10-13 ENCOUNTER — Inpatient Hospital Stay (HOSPITAL_COMMUNITY)
Admission: EM | Admit: 2023-10-13 | Discharge: 2023-10-15 | DRG: 442 | Disposition: A | Discharge status: Home Health | Payer: Medicare HMO | Attending: Family Medicine | Admitting: Family Medicine

## 2023-10-13 ENCOUNTER — Encounter (HOSPITAL_COMMUNITY): Payer: Self-pay | Admitting: Internal Medicine

## 2023-10-13 ENCOUNTER — Other Ambulatory Visit: Payer: Self-pay

## 2023-10-13 ENCOUNTER — Ambulatory Visit: Payer: Medicare HMO | Admitting: Internal Medicine

## 2023-10-13 ENCOUNTER — Other Ambulatory Visit: Payer: Medicare HMO

## 2023-10-13 DIAGNOSIS — N200 Calculus of kidney: Secondary | ICD-10-CM | POA: Diagnosis not present

## 2023-10-13 DIAGNOSIS — B179 Acute viral hepatitis, unspecified: Secondary | ICD-10-CM | POA: Diagnosis present

## 2023-10-13 DIAGNOSIS — R5381 Other malaise: Secondary | ICD-10-CM | POA: Diagnosis not present

## 2023-10-13 DIAGNOSIS — R7401 Elevation of levels of liver transaminase levels: Secondary | ICD-10-CM | POA: Diagnosis not present

## 2023-10-13 DIAGNOSIS — C3492 Malignant neoplasm of unspecified part of left bronchus or lung: Secondary | ICD-10-CM | POA: Diagnosis present

## 2023-10-13 DIAGNOSIS — I639 Cerebral infarction, unspecified: Secondary | ICD-10-CM

## 2023-10-13 DIAGNOSIS — K449 Diaphragmatic hernia without obstruction or gangrene: Secondary | ICD-10-CM | POA: Diagnosis not present

## 2023-10-13 DIAGNOSIS — Z66 Do not resuscitate: Secondary | ICD-10-CM | POA: Diagnosis present

## 2023-10-13 DIAGNOSIS — Z90710 Acquired absence of both cervix and uterus: Secondary | ICD-10-CM

## 2023-10-13 DIAGNOSIS — M8580 Other specified disorders of bone density and structure, unspecified site: Secondary | ICD-10-CM | POA: Diagnosis present

## 2023-10-13 DIAGNOSIS — Z7901 Long term (current) use of anticoagulants: Secondary | ICD-10-CM

## 2023-10-13 DIAGNOSIS — E039 Hypothyroidism, unspecified: Secondary | ICD-10-CM | POA: Diagnosis present

## 2023-10-13 DIAGNOSIS — I10 Essential (primary) hypertension: Secondary | ICD-10-CM | POA: Diagnosis present

## 2023-10-13 DIAGNOSIS — Z79890 Hormone replacement therapy (postmenopausal): Secondary | ICD-10-CM

## 2023-10-13 DIAGNOSIS — Z8262 Family history of osteoporosis: Secondary | ICD-10-CM

## 2023-10-13 DIAGNOSIS — F419 Anxiety disorder, unspecified: Secondary | ICD-10-CM | POA: Diagnosis present

## 2023-10-13 DIAGNOSIS — Z803 Family history of malignant neoplasm of breast: Secondary | ICD-10-CM

## 2023-10-13 DIAGNOSIS — K712 Toxic liver disease with acute hepatitis: Secondary | ICD-10-CM | POA: Diagnosis not present

## 2023-10-13 DIAGNOSIS — Z87891 Personal history of nicotine dependence: Secondary | ICD-10-CM

## 2023-10-13 DIAGNOSIS — R627 Adult failure to thrive: Secondary | ICD-10-CM | POA: Diagnosis not present

## 2023-10-13 DIAGNOSIS — K219 Gastro-esophageal reflux disease without esophagitis: Secondary | ICD-10-CM | POA: Diagnosis present

## 2023-10-13 DIAGNOSIS — Z8249 Family history of ischemic heart disease and other diseases of the circulatory system: Secondary | ICD-10-CM

## 2023-10-13 DIAGNOSIS — E782 Mixed hyperlipidemia: Secondary | ICD-10-CM | POA: Diagnosis present

## 2023-10-13 DIAGNOSIS — R197 Diarrhea, unspecified: Secondary | ICD-10-CM | POA: Diagnosis present

## 2023-10-13 DIAGNOSIS — Z885 Allergy status to narcotic agent status: Secondary | ICD-10-CM

## 2023-10-13 DIAGNOSIS — R9431 Abnormal electrocardiogram [ECG] [EKG]: Secondary | ICD-10-CM | POA: Diagnosis not present

## 2023-10-13 DIAGNOSIS — F32A Depression, unspecified: Secondary | ICD-10-CM | POA: Diagnosis present

## 2023-10-13 DIAGNOSIS — D696 Thrombocytopenia, unspecified: Secondary | ICD-10-CM | POA: Diagnosis present

## 2023-10-13 DIAGNOSIS — R001 Bradycardia, unspecified: Secondary | ICD-10-CM | POA: Diagnosis not present

## 2023-10-13 DIAGNOSIS — T451X5A Adverse effect of antineoplastic and immunosuppressive drugs, initial encounter: Secondary | ICD-10-CM | POA: Diagnosis present

## 2023-10-13 DIAGNOSIS — Z1152 Encounter for screening for COVID-19: Secondary | ICD-10-CM

## 2023-10-13 DIAGNOSIS — R531 Weakness: Secondary | ICD-10-CM | POA: Diagnosis not present

## 2023-10-13 DIAGNOSIS — Z8041 Family history of malignant neoplasm of ovary: Secondary | ICD-10-CM

## 2023-10-13 DIAGNOSIS — Z8673 Personal history of transient ischemic attack (TIA), and cerebral infarction without residual deficits: Secondary | ICD-10-CM

## 2023-10-13 DIAGNOSIS — Z79899 Other long term (current) drug therapy: Secondary | ICD-10-CM

## 2023-10-13 DIAGNOSIS — Z743 Need for continuous supervision: Secondary | ICD-10-CM | POA: Diagnosis not present

## 2023-10-13 DIAGNOSIS — I700 Atherosclerosis of aorta: Secondary | ICD-10-CM | POA: Diagnosis not present

## 2023-10-13 LAB — CBC WITH DIFFERENTIAL/PLATELET
Abs Immature Granulocytes: 0.11 10*3/uL — ABNORMAL HIGH (ref 0.00–0.07)
Basophils Absolute: 0.1 10*3/uL (ref 0.0–0.1)
Basophils Relative: 1 %
Eosinophils Absolute: 0.1 10*3/uL (ref 0.0–0.5)
Eosinophils Relative: 1 %
HCT: 43.9 % (ref 36.0–46.0)
Hemoglobin: 14.6 g/dL (ref 12.0–15.0)
Immature Granulocytes: 1 %
Lymphocytes Relative: 11 %
Lymphs Abs: 0.9 10*3/uL (ref 0.7–4.0)
MCH: 29.9 pg (ref 26.0–34.0)
MCHC: 33.3 g/dL (ref 30.0–36.0)
MCV: 90 fL (ref 80.0–100.0)
Monocytes Absolute: 0.6 10*3/uL (ref 0.1–1.0)
Monocytes Relative: 6 %
Neutro Abs: 6.9 10*3/uL (ref 1.7–7.7)
Neutrophils Relative %: 80 %
Platelets: 87 10*3/uL — ABNORMAL LOW (ref 150–400)
RBC: 4.88 MIL/uL (ref 3.87–5.11)
RDW: 13.2 % (ref 11.5–15.5)
WBC: 8.6 10*3/uL (ref 4.0–10.5)
nRBC: 0 % (ref 0.0–0.2)

## 2023-10-13 LAB — URINALYSIS, ROUTINE W REFLEX MICROSCOPIC
Bilirubin Urine: NEGATIVE
Glucose, UA: NEGATIVE mg/dL
Ketones, ur: 20 mg/dL — AB
Leukocytes,Ua: NEGATIVE
Nitrite: NEGATIVE
Protein, ur: 100 mg/dL — AB
Specific Gravity, Urine: 1.02 (ref 1.005–1.030)
pH: 5 (ref 5.0–8.0)

## 2023-10-13 LAB — COMPREHENSIVE METABOLIC PANEL
ALT: 1314 U/L — ABNORMAL HIGH (ref 0–44)
AST: 351 U/L — ABNORMAL HIGH (ref 15–41)
Albumin: 3.4 g/dL — ABNORMAL LOW (ref 3.5–5.0)
Alkaline Phosphatase: 74 U/L (ref 38–126)
Anion gap: 10 (ref 5–15)
BUN: 27 mg/dL — ABNORMAL HIGH (ref 8–23)
CO2: 19 mmol/L — ABNORMAL LOW (ref 22–32)
Calcium: 8.6 mg/dL — ABNORMAL LOW (ref 8.9–10.3)
Chloride: 107 mmol/L (ref 98–111)
Creatinine, Ser: 1.04 mg/dL — ABNORMAL HIGH (ref 0.44–1.00)
GFR, Estimated: 56 mL/min — ABNORMAL LOW (ref >60)
Glucose, Bld: 85 mg/dL (ref 70–99)
Potassium: 3.6 mmol/L (ref 3.5–5.1)
Sodium: 136 mmol/L (ref 135–145)
Total Bilirubin: 0.9 mg/dL (ref <1.2)
Total Protein: 5.7 g/dL — ABNORMAL LOW (ref 6.5–8.1)

## 2023-10-13 LAB — ACETAMINOPHEN LEVEL: Acetaminophen (Tylenol), Serum: <10 ug/mL — ABNORMAL LOW (ref 10–30)

## 2023-10-13 LAB — HEPATITIS PANEL, ACUTE
HCV Ab: NONREACTIVE
HCV Ab: NONREACTIVE
Hep A IgM: NONREACTIVE
Hep A IgM: NONREACTIVE
Hep B C IgM: NONREACTIVE
Hep B C IgM: NONREACTIVE
Hepatitis B Surface Ag: NONREACTIVE
Hepatitis B Surface Ag: NONREACTIVE

## 2023-10-13 LAB — SARS CORONAVIRUS 2 BY RT PCR: SARS Coronavirus 2 by RT PCR: NEGATIVE

## 2023-10-13 LAB — FOLATE: Folate: 10.7 ng/mL (ref >5.9)

## 2023-10-13 LAB — TSH: TSH: 5.948 u[IU]/mL — ABNORMAL HIGH (ref 0.350–4.500)

## 2023-10-13 LAB — VITAMIN B12: Vitamin B-12: 1484 pg/mL — ABNORMAL HIGH (ref 180–914)

## 2023-10-13 MED ORDER — POTASSIUM CHLORIDE IN NACL 20-0.9 MEQ/L-% IV SOLN: INTRAVENOUS | Status: AC

## 2023-10-13 MED ORDER — TOPIRAMATE 25 MG PO TABS
50.0000 mg | ORAL_TABLET | Freq: Two times a day (BID) | ORAL | Status: DC
  Administered 2023-10-14 – 2023-10-15 (×3): 50 mg via ORAL
  Filled 2023-10-13 (×3): qty 2

## 2023-10-13 MED ORDER — APIXABAN 2.5 MG PO TABS
2.5000 mg | ORAL_TABLET | Freq: Two times a day (BID) | ORAL | Status: DC
  Administered 2023-10-13 – 2023-10-15 (×4): 2.5 mg via ORAL
  Filled 2023-10-13 (×4): qty 1

## 2023-10-13 MED ORDER — LEVOTHYROXINE SODIUM 50 MCG PO TABS
50.0000 ug | ORAL_TABLET | Freq: Every day | ORAL | Status: DC
  Administered 2023-10-14 – 2023-10-15 (×2): 50 ug via ORAL
  Filled 2023-10-13 (×2): qty 1

## 2023-10-13 MED ORDER — SERTRALINE HCL 50 MG PO TABS
50.0000 mg | ORAL_TABLET | Freq: Every day | ORAL | Status: DC
  Administered 2023-10-14 – 2023-10-15 (×2): 50 mg via ORAL
  Filled 2023-10-13 (×3): qty 1

## 2023-10-13 MED ORDER — PROCHLORPERAZINE EDISYLATE 10 MG/2ML IJ SOLN
10.0000 mg | Freq: Four times a day (QID) | INTRAMUSCULAR | Status: DC
  Administered 2023-10-13 – 2023-10-15 (×7): 10 mg via INTRAVENOUS
  Filled 2023-10-13 (×7): qty 2

## 2023-10-13 MED ORDER — LACTATED RINGERS IV BOLUS
1000.0000 mL | Freq: Once | INTRAVENOUS | Status: AC
  Administered 2023-10-13: 1000 mL via INTRAVENOUS

## 2023-10-13 MED ORDER — METHYLPREDNISOLONE SODIUM SUCC 40 MG IJ SOLR
40.0000 mg | Freq: Every day | INTRAMUSCULAR | Status: DC
  Administered 2023-10-13 – 2023-10-14 (×2): 40 mg via INTRAVENOUS
  Filled 2023-10-13 (×2): qty 1

## 2023-10-13 MED ORDER — ALPRAZOLAM 0.5 MG PO TABS: 0.5000 mg | ORAL_TABLET | Freq: Every evening | ORAL | Status: DC | PRN

## 2023-10-13 NOTE — H&P (Signed)
History and Physical    Patient: Patricia [name]:[id number] DOB: 1946/06/14 DOA: 10/13/2023 DOS: the patient was seen and examined on 10/13/2023 PCP: [name], MD  Patient coming from: Home  Chief Complaint:  Chief Complaint  Patient presents with   Weakness   HPI: MELANA HINGLE is a 76 year old female with a history of Stage IV (T1c, N3, M1 C) non-small cell lung cancer diagnosed Jan 2024, hypertension, hyperlipidemia, hypothyroidism, depression/anxiety GERD presenting with at least 1 week history of generalized weakness and decreased oral intake that significantly worsened over the past 2 to 3 days.  Notably, the patient recently started adagrasib 600 mg twice daily on 09/26/2023 for her lung cancer.  After 3 days of the medication, the patient began experiencing loose stools without hematochezia or melena.  She states that she began developing nausea and decreased oral intake.  She states that her symptoms have continued to progress to the point where she has had worsening generalized weakness to the point she is not able to get out of bed in the past week.  She states that she stopped taking adagrasib, the last dose being 10/10/2023.  Since then, she states that her loose stools have slowed down.  She has had about 2-3 bowel movements per day.  She denies any frank abdominal pain but has some general upper abdominal discomfort.  She denies any fevers, chills, chest pain, shortness of breath, cough, hemoptysis, emesis.  There is no hematochezia or melena.  She denies any dysuria.  She states that her nausea has persisted despite premedicating with Zofran.  Notably, the patient finished 8 cycles of Keytruda.  She was hospitalized from 06/22/2023 to 06/26/2023 with diarrhea and was diagnosed with colitis noted on CT with wall thickening of the ascending colon.  She underwent colonoscopy on 06/24/2023.  The pathology showed lymphocytic colitis.  The patient finished 8 weeks of budesonide  with improvement of her symptoms.  In the ED, the patient was afebrile hemodynamically stable with oxygen saturation 96% room air.  WBC 8.6, hemoglobin 14.6, platelets 87,000.  Sodium 136, potassium 3.6, bicarbonate 19, serum creatinine 1.04.  AST 351, ALT 1214, alk phosphatase 74, total bilirubin 0.9.  EKG shows sinus rhythm with nonspecific ST changes.  Chest x-ray was negative for any infiltrates or edema.  Abdominal ultrasound showed increased hepatic echogenicity without cholecystitis.  There was left nephrolithiasis that was nonobstructive.  The patient was admitted for further workup and treatment of her failure to thrive and acute hepatitis.  Review of Systems: As mentioned in the history of present illness. All other systems reviewed and are negative. Past Medical History:  Diagnosis Date   Depression    GERD (gastroesophageal reflux disease)    Hypercholesterolemia    IFG (impaired fasting glucose)    Nonintractable episodic headache 01/18/2023   Osteopenia    PONV (postoperative nausea and vomiting)    Stroke [facility])    Past Surgical History:  Procedure Laterality Date   ABDOMINAL HYSTERECTOMY     BIOPSY  06/24/2023   Procedure: BIOPSY;  Surgeon: Dolores Frame, MD;  Location: [facility] ENDO SUITE;  Service: Gastroenterology;;   CATARACT EXTRACTION W/PHACO  09/12/2011   Procedure: CATARACT EXTRACTION PHACO AND INTRAOCULAR LENS PLACEMENT (IOC);  Surgeon: Gemma Payor;  Location: [facility] ORS;  Service: Ophthalmology;  Laterality: Left;  CDE: 8.91   CATARACT EXTRACTION W/PHACO  11/18/2011   Procedure: CATARACT EXTRACTION PHACO AND INTRAOCULAR LENS PLACEMENT (IOC);  Surgeon: Gemma Payor;  Location: [facility] ORS;  Service: Ophthalmology;  Laterality: Right;  CDE:10.26   COLONOSCOPY  2007   Dr. Jena Gauss: internal hemorrhoids, single anal papilla poor prep.    COLONOSCOPY N/A 03/27/2017   Rourk: Normal exam.   COLONOSCOPY  06/24/2023   Procedure: COLONOSCOPY;  Surgeon: Marguerita Merles, Reuel Boom, MD;   Location: [facility];  Service: Gastroenterology;;   ECTOPIC PREGNANCY SURGERY     removal of right tube   ESOPHAGOGASTRODUODENOSCOPY (EGD) WITH PROPOFOL N/A 09/10/2021   Procedure: ESOPHAGOGASTRODUODENOSCOPY (EGD) WITH PROPOFOL;  Surgeon: Corbin Ade, MD;  Location: [facility];  Service: Endoscopy;  Laterality: N/A;  8:15AM   MALONEY DILATION N/A 09/10/2021   Procedure: Elease Hashimoto DILATION;  Surgeon: Corbin Ade, MD;  Location: [facility];  Service: Endoscopy;  Laterality: N/A;   OVARIAN CYST REMOVAL     right   RECTOCELE REPAIR N/A 01/18/2014   Procedure: POSTERIOR REPAIR (RECTOCELE);  Surgeon: Tilda Burrow, MD;  Location: [facility] ORS;  Service: Gynecology;  Laterality: N/A;   VAGINAL HYSTERECTOMY N/A 01/18/2014   Procedure: HYSTERECTOMY VAGINAL;  Surgeon: Tilda Burrow, MD;  Location: [facility] ORS;  Service: Gynecology;  Laterality: N/A;   Social History:  reports that she quit smoking about 39 years ago. Her smoking use included cigarettes. She started smoking about 59 years ago. She has a 20 pack-year smoking history. She has never used smokeless tobacco. She reports that she does not drink alcohol and does not use drugs.  Allergies  Allergen Reactions   Codeine Nausea Only    Family History  Problem Relation Age of Onset   Osteoporosis Mother    Heart attack Father    Heart attack Brother 22       MI   Breast cancer Maternal Aunt    Breast cancer Paternal Aunt    Cancer Paternal Aunt        ovarian cancer   Anesthesia problems Neg Hx    Hypotension Neg Hx    Malignant hyperthermia Neg Hx    Pseudochol deficiency Neg Hx    Colon cancer Neg Hx    Inflammatory bowel disease Neg Hx     Prior to Admission medications   Medication Sig Start Date End Date Taking? Authorizing Provider  adagrasib (KRAZATI) 200 MG tablet Take 3 tablets (600 mg total) by mouth 2 (two) times daily. 09/09/23   Si Gaul, MD  ALPRAZolam Prudy Feeler) 0.5 MG tablet Take 1 tablet (0.5 mg total)  by mouth at bedtime as needed for anxiety or sleep. TAKE (1) TABLET BY MOUTH AT BEDTIME AS NEEDED FOR SLEEP. 09/18/23   Campbell Riches, NP  apixaban (ELIQUIS) 2.5 MG TABS tablet Take 1 tablet (2.5 mg total) by mouth 2 (two) times daily. 09/09/23   Si Gaul, MD  budesonide (ENTOCORT EC) 3 MG 24 hr capsule Take 2 capsules (6 mg total) by mouth daily. 08/28/23   Rourk, Gerrit Friends, MD  butalbital-acetaminophen-caffeine (FIORICET) [id number] MG tablet TAKE 1/2 TABLET BY MOUTH EVERY 6 (SIX) HOURS AS NEEDED FOR HEADACHE. 08/02/23   [name], MD  famotidine (PEPCID) 40 MG tablet Take 1 tablet (40 mg total) by mouth daily. 07/10/23   [name], MD  lansoprazole (PREVACID SOLUTAB) 30 MG disintegrating tablet 1 qd 08/20/23   [name], MD  levothyroxine (SYNTHROID) 50 MCG tablet TAKE 1 TABLET BY MOUTH DAILY BEFORE BREAKFAST 06/06/23   Si Gaul, MD  Menthol-Methyl Salicylate (MUSCLE RUB) 10-15 % CREA Apply 1 Application topically 3 (three) times daily to  bilateral calves 02/04/23   Love, Evlyn Kanner, PA-C  methylPREDNISolone (MEDROL DOSEPAK) 4 MG TBPK tablet Use as instructed 08/19/23   Heilingoetter, Cassandra L, PA-C  Multiple Vitamin (MULTIVITAMIN WITH MINERALS) TABS tablet Take 1 tablet by mouth daily.    [provider]  ondansetron (ZOFRAN) 8 MG tablet Take 0.5 tablets (4 mg total) by mouth every 8 (eight) hours as needed for nausea or vomiting. 09/29/23   Si Gaul, MD  pantoprazole (PROTONIX) 40 MG tablet Take 1 tablet (40 mg total) by mouth daily. 06/27/23   Vassie Loll, MD  potassium chloride SA (KLOR-CON M20) 20 MEQ tablet Take 2 tablets (40 mEq total) by mouth 2 (two) times daily. 06/26/23   Vassie Loll, MD  prochlorperazine (COMPAZINE) 10 MG tablet Take 1 tablet (10 mg total) by mouth every 6 (six) hours as needed for nausea or vomiting. 02/04/23   Love, Evlyn Kanner, PA-C  rosuvastatin (CRESTOR) 20 MG tablet TAKE 1 TABLET BY MOUTH EVERY DAY 09/08/23   [name], MD  sertraline (ZOLOFT) 50 MG tablet Take 1 tablet (50 mg total) by mouth daily. 08/20/23   [name], MD  topiramate (TOPAMAX) 50 MG tablet Take 1 tablet (50 mg total) by mouth 2 (two) times daily. 08/18/23   Henreitta Leber, MD    Physical Exam: Vitals:   10/13/23 1215 10/13/23 1230 10/13/23 1300 10/13/23 1505  BP: 139/81 (!) 141/75    Pulse: 78 70 71 74  Resp: 16 19 12 10   Temp: 98.4 F (36.9 C)     TempSrc: Oral     SpO2: 96% 96% 95% 95%   GENERAL:  A&O x 3, NAD, well developed, cooperative, follows commands HEENT: [place]/AT, No thrush, No icterus, No oral ulcers Neck:  No neck mass, No meningismus, soft, supple CV: RRR, no S3, no S4, no rub, no JVD Lungs:  CTA, no wheeze, no rhonchi, good air movement Abd: soft/NT +BS, nondistended Ext: No edema, no lymphangitis, no cyanosis, no rashes Neuro:  CN II-XII intact, strength 4/5 in RUE, RLE, strength 4/5 LUE, LLE; sensation intact bilateral; no dysmetria; babinski equivocal  Data Reviewed: Data reviewed above in the history  Assessment and Plan: Transaminasemia/acute hepatitis -Likely secondary to adagrasib -holding adagrasib -Abdominal ultrasound--increased hepatic echogenicity, no cholecystitis -Trend LFTs -Viral panel -Acetaminophen level negative -Check INR  Failure to thrive -B12 -Folic acid -TSH -PT evaluation -UA  Thrombocytopenia -Workup as discussed above -Likely secondary to adagrasib  Intractable nausea -Start around-the-clock Zofran -Full liquid diet -Start IV fluids -PPI twice daily  Diarrhea -Stool for C. Difficile -Stool pathogen panel  Anxiety/depression -Continue sertraline and alprazolam  Stage 4 NSCLC -Status post 9 cycles Keytruda, but developed immune mediated -Outpatient follow-up with Dr. 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 Observation status is judged to be reasonable and necessary in order to provide the required intensity of service to ensure the patient's safety. The patient's presenting symptoms, physical exam findings, and initial radiographic and laboratory data in the context of their medical condition is felt to place them at decreased risk for further clinical deterioration. Furthermore, it is anticipated that the patient will be medically stable for discharge from the hospital within 2 midnights of admission.   Author: Catarina Hartshorn, MD 10/13/2023 3:31 PM  For on  call review [email or web address].

## 2023-10-13 NOTE — ED Notes (Signed)
Attempted to obtain IV access twice, was unable to obtain IV access, will ask another RN to attempt IV access

## 2023-10-13 NOTE — ED Triage Notes (Signed)
Pt arrived via [facility] c/o generalized weakness and just "feeling unwell" x 1 week. Hx of cancer, per EMS, has been non-complaint with her chemo medication for the past week. Denies N/V, denies CP, does state some sob.

## 2023-10-13 NOTE — ED Notes (Signed)
Pt attempted to use bedpan, took pt off the bedpan; However, there was very little urine in the pan, not enough to send for a sample. Pt states she hasn't had anything to drink hardly today

## 2023-10-13 NOTE — Hospital Course (Signed)
77 year old female with a history of Stage IV (T1c, N3, M1 C) non-small cell lung cancer diagnosed Jan 2024, hypertension, hyperlipidemia, hypothyroidism, depression/anxiety GERD presenting with at least 1 week history of generalized weakness and decreased oral intake that significantly worsened over the past 2 to 3 days.  Notably, the patient recently started adagrasib 600 mg twice daily on 09/26/2023 for her lung cancer.  After 3 days of the medication, the patient began experiencing loose stools without hematochezia or melena.  She states that she began developing nausea and decreased oral intake.  She states that her symptoms have continued to progress to the point where she has had worsening generalized weakness to the point she is not able to get out of bed in the past week.  She states that she stopped taking adagrasib, the last dose being 10/10/2023.  Since then, she states that her loose stools have slowed down.  She has had about 2-3 bowel movements per day.  She denies any frank abdominal pain but has some general upper abdominal discomfort.  She denies any fevers, chills, chest pain, shortness of breath, cough, hemoptysis, emesis.  There is no hematochezia or melena.  She denies any dysuria.  She states that her nausea has persisted despite premedicating with Zofran.  Notably, the patient finished 8 cycles of Keytruda.  She was hospitalized from 06/22/2023 to 06/26/2023 with diarrhea and was diagnosed with colitis noted on CT with wall thickening of the ascending colon.  She underwent colonoscopy on 06/24/2023.  The pathology showed lymphocytic colitis.  The patient finished 8 weeks of budesonide with improvement of her symptoms.  In the ED, the patient was afebrile hemodynamically stable with oxygen saturation 96% room air.  WBC 8.6, hemoglobin 14.6, platelets 87,000.  Sodium 136, potassium 3.6, bicarbonate 19, serum creatinine 1.04.  AST 351, ALT 1214, alk phosphatase 74, total bilirubin 0.9.  EKG  shows sinus rhythm with nonspecific ST changes.  Chest x-ray was negative for any infiltrates or edema.  Abdominal ultrasound showed increased hepatic echogenicity without cholecystitis.  There was left nephrolithiasis that was nonobstructive.  The patient was admitted for further workup and treatment of her failure to thrive and acute hepatitis.

## 2023-10-13 NOTE — ED Notes (Signed)
Pt requesting ice chips, Per PA-C, pt can have ice chips--ice chips given to pt and pt readjusted in bed

## 2023-10-13 NOTE — ED Notes (Signed)
Pt A & O x 4, pt is soft spoken at baseline

## 2023-10-13 NOTE — ED Provider Notes (Signed)
[place] EMERGENCY DEPARTMENT AT [facility] Provider Note   CSN: [id number] Arrival date & time: 10/13/23  [id number]     History  Chief Complaint  Patient presents with   Weakness    Patricia [name] is a 77 y.o. female.  She has PMH of metastatic non-small cell lung cancer diagnosed in January 2024, currently on Krazati follows with local oncology group.  She states since August she has had generalized weakness but over the past 2 weeks it has gotten worse, she states she has "no energy ".  Her daughter is at bedside and reports that over the past 2 to 3 days she has had very little to eat and drink, yesterday only had a very small amount of chicken soup.  She has had chronic diarrhea since starting chemotherapy this is unchanged.  She denies abdominal pain nausea or vomiting.  Denies fevers or chills, no chest pain or shortness of breath.   Weakness      Home Medications Prior to Admission medications   Medication Sig Start Date End Date Taking? Authorizing Provider  adagrasib (KRAZATI) 200 MG tablet Take 3 tablets (600 mg total) by mouth 2 (two) times daily. 09/09/23   Si Gaul, MD  ALPRAZolam Prudy Feeler) 0.5 MG tablet Take 1 tablet (0.5 mg total) by mouth at bedtime as needed for anxiety or sleep. TAKE (1) TABLET BY MOUTH AT BEDTIME AS NEEDED FOR SLEEP. 09/18/23   Campbell Riches, NP  apixaban (ELIQUIS) 2.5 MG TABS tablet Take 1 tablet (2.5 mg total) by mouth 2 (two) times daily. 09/09/23   Si Gaul, MD  budesonide (ENTOCORT EC) 3 MG 24 hr capsule Take 2 capsules (6 mg total) by mouth daily. 08/28/23   Rourk, Gerrit Friends, MD  butalbital-acetaminophen-caffeine (FIORICET) [phone] MG tablet TAKE 1/2 TABLET BY MOUTH EVERY 6 (SIX) HOURS AS NEEDED FOR HEADACHE. 08/02/23   [name], MD  famotidine (PEPCID) 40 MG tablet Take 1 tablet (40 mg total) by mouth daily. 07/10/23   [name], MD  lansoprazole (PREVACID SOLUTAB) 30 MG disintegrating tablet 1 qd  08/20/23   [name], MD  levothyroxine (SYNTHROID) 50 MCG tablet TAKE 1 TABLET BY MOUTH DAILY BEFORE BREAKFAST 06/06/23   Si Gaul, MD  Menthol-Methyl Salicylate (MUSCLE RUB) 10-15 % CREA Apply 1 Application topically 3 (three) times daily to bilateral calves 02/04/23   Love, Evlyn Kanner, PA-C  methylPREDNISolone (MEDROL DOSEPAK) 4 MG TBPK tablet Use as instructed 08/19/23   Heilingoetter, Cassandra L, PA-C  Multiple Vitamin (MULTIVITAMIN WITH MINERALS) TABS tablet Take 1 tablet by mouth daily.    [provider]  ondansetron (ZOFRAN) 8 MG tablet Take 0.5 tablets (4 mg total) by mouth every 8 (eight) hours as needed for nausea or vomiting. 09/29/23   Si Gaul, MD  pantoprazole (PROTONIX) 40 MG tablet Take 1 tablet (40 mg total) by mouth daily. 06/27/23   Vassie Loll, MD  potassium chloride SA (KLOR-CON M20) 20 MEQ tablet Take 2 tablets (40 mEq total) by mouth 2 (two) times daily. 06/26/23   Vassie Loll, MD  prochlorperazine (COMPAZINE) 10 MG tablet Take 1 tablet (10 mg total) by mouth every 6 (six) hours as needed for nausea or vomiting. 02/04/23   Love, Evlyn Kanner, PA-C  rosuvastatin (CRESTOR) 20 MG tablet TAKE 1 TABLET BY MOUTH EVERY DAY 09/08/23   [name], MD  sertraline (ZOLOFT) 50 MG tablet Take 1 tablet (50 mg total) by mouth daily. 08/20/23   Luking,  Scott A, MD  topiramate (TOPAMAX) 50 MG tablet Take 1 tablet (50 mg total) by mouth 2 (two) times daily. 08/18/23   Henreitta Leber, MD      Allergies    Codeine    Review of Systems   Review of Systems  Neurological:  Positive for weakness.    Physical Exam Updated Vital Signs BP (!) 140/85 (BP Location: Right Arm)   Pulse 64   Temp 98.5 F (36.9 C) (Oral)   Resp 18   SpO2 95%  Physical Exam Vitals and nursing note reviewed.  Constitutional:      General: She is not in acute distress.    Appearance: She is well-developed.  HENT:     Head: Normocephalic and atraumatic.     Mouth/Throat:      Mouth: Mucous membranes are dry.  Eyes:     Extraocular Movements: Extraocular movements intact.     Conjunctiva/sclera: Conjunctivae normal.     Pupils: Pupils are equal, round, and reactive to light.  Cardiovascular:     Rate and Rhythm: Normal rate and regular rhythm.     Heart sounds: No murmur heard. Pulmonary:     Effort: Pulmonary effort is normal. No respiratory distress.     Breath sounds: Normal breath sounds.  Abdominal:     Palpations: Abdomen is soft.     Tenderness: There is no abdominal tenderness.  Musculoskeletal:        General: No swelling.     Cervical back: Neck supple.  Skin:    General: Skin is warm and dry.     Capillary Refill: Capillary refill takes less than 2 seconds.  Neurological:     General: No focal deficit present.     Mental Status: She is alert and oriented to person, place, and time.  Psychiatric:        Mood and Affect: Mood normal.     ED Results / Procedures / Treatments   Labs (all labs ordered are listed, but only abnormal results are displayed) Labs Reviewed - No data to display  EKG None  Radiology No results found.  Procedures Procedures    Medications Ordered in ED Medications - No data to display  ED Course/ Medical Decision Making/ A&P                                 Medical Decision Making This patient presents to the ED for concern of weakness, decreased appetite, this involves an extensive number of treatment options, and is a complaint that carries with it a high risk of complications and morbidity.  The differential diagnosis includes UTI, sepsis, electrolyte abnormality, UTI, other   Co morbidities that complicate the patient evaluation :   Lung cancer   Additional history obtained:  Additional history obtained from MR External records from outside source obtained and reviewed including notes, radiology, labs   Lab Tests:  I Ordered, and personally interpreted labs.  The pertinent results include:  Patient has significant transaminitis   Imaging Studies ordered:  I ordered imaging studies including ReSound right upper quadrant which shows fatty infiltration of the liver I independently visualized and interpreted imaging within scope of identifying emergent findings  I agree with the radiologist interpretation   Cardiac Monitoring: / EKG:  The patient was maintained on a cardiac monitor.  I personally viewed and interpreted the cardiac monitored which showed an underlying rhythm of: Sinus rhythm  Consultations Obtained:  I requested consultation with the oncologist Dr. Ellin Saba,  and discussed lab and imaging findings as well as pertinent plan - they recommend: Hold patient's chemotherapy and have her follow-up outpatient with Dr. Shirline Frees.  Do not restart medication till she follows up with her oncologist.  Discussed with hospitalist for admission for dehydration and deconditioning   Problem List / ED Course / Critical interventions / Medication management  Generalized weakness-worsening, now patient needs assistance just to the bathroom has not really gotten out of bed in the past 2 weeks, noted to have transaminitis likely from her oral cancer medications as hepatotoxicity is a known side effect, discussed this with oncology, they advised to hold until she follows up with her oncologist once she is discharged.  Will admit for hydration, patient may need physical therapy consultation as well if she is not able to ambulate  I have reviewed the patients home medicines and have made adjustments as needed   Amount and/or Complexity of Data Reviewed Labs: ordered. Radiology: ordered. ECG/medicine tests: ordered.  Risk Decision regarding hospitalization.           Final Clinical Impression(s) / ED Diagnoses Final diagnoses:  None    Rx / DC Orders ED Discharge Orders     None         Ma Rings, PA-C 10/13/23 1618    Cathren Laine, MD 10/16/23  1529

## 2023-10-13 NOTE — ED Notes (Signed)
RN certified in [name] IV attempting to obtain IV access at this time

## 2023-10-14 ENCOUNTER — Telehealth: Payer: Self-pay | Admitting: Medical Oncology

## 2023-10-14 DIAGNOSIS — I10 Essential (primary) hypertension: Secondary | ICD-10-CM | POA: Diagnosis not present

## 2023-10-14 DIAGNOSIS — K219 Gastro-esophageal reflux disease without esophagitis: Secondary | ICD-10-CM | POA: Diagnosis not present

## 2023-10-14 DIAGNOSIS — Z8673 Personal history of transient ischemic attack (TIA), and cerebral infarction without residual deficits: Secondary | ICD-10-CM | POA: Diagnosis not present

## 2023-10-14 DIAGNOSIS — Z885 Allergy status to narcotic agent status: Secondary | ICD-10-CM | POA: Diagnosis not present

## 2023-10-14 DIAGNOSIS — Z7901 Long term (current) use of anticoagulants: Secondary | ICD-10-CM | POA: Diagnosis not present

## 2023-10-14 DIAGNOSIS — Z8041 Family history of malignant neoplasm of ovary: Secondary | ICD-10-CM | POA: Diagnosis not present

## 2023-10-14 DIAGNOSIS — R627 Adult failure to thrive: Secondary | ICD-10-CM | POA: Diagnosis not present

## 2023-10-14 DIAGNOSIS — Z87891 Personal history of nicotine dependence: Secondary | ICD-10-CM | POA: Diagnosis not present

## 2023-10-14 DIAGNOSIS — T451X5A Adverse effect of antineoplastic and immunosuppressive drugs, initial encounter: Secondary | ICD-10-CM | POA: Diagnosis not present

## 2023-10-14 DIAGNOSIS — Z79899 Other long term (current) drug therapy: Secondary | ICD-10-CM | POA: Diagnosis not present

## 2023-10-14 DIAGNOSIS — K712 Toxic liver disease with acute hepatitis: Secondary | ICD-10-CM | POA: Diagnosis not present

## 2023-10-14 DIAGNOSIS — Z1152 Encounter for screening for COVID-19: Secondary | ICD-10-CM | POA: Diagnosis not present

## 2023-10-14 DIAGNOSIS — D696 Thrombocytopenia, unspecified: Secondary | ICD-10-CM

## 2023-10-14 DIAGNOSIS — Z803 Family history of malignant neoplasm of breast: Secondary | ICD-10-CM | POA: Diagnosis not present

## 2023-10-14 DIAGNOSIS — E039 Hypothyroidism, unspecified: Secondary | ICD-10-CM | POA: Diagnosis not present

## 2023-10-14 DIAGNOSIS — Z8262 Family history of osteoporosis: Secondary | ICD-10-CM | POA: Diagnosis not present

## 2023-10-14 DIAGNOSIS — F32A Depression, unspecified: Secondary | ICD-10-CM | POA: Diagnosis not present

## 2023-10-14 DIAGNOSIS — E782 Mixed hyperlipidemia: Secondary | ICD-10-CM | POA: Diagnosis not present

## 2023-10-14 DIAGNOSIS — Z8249 Family history of ischemic heart disease and other diseases of the circulatory system: Secondary | ICD-10-CM | POA: Diagnosis not present

## 2023-10-14 DIAGNOSIS — M8580 Other specified disorders of bone density and structure, unspecified site: Secondary | ICD-10-CM | POA: Diagnosis not present

## 2023-10-14 DIAGNOSIS — Z79890 Hormone replacement therapy (postmenopausal): Secondary | ICD-10-CM | POA: Diagnosis not present

## 2023-10-14 DIAGNOSIS — Z66 Do not resuscitate: Secondary | ICD-10-CM | POA: Diagnosis not present

## 2023-10-14 DIAGNOSIS — C3492 Malignant neoplasm of unspecified part of left bronchus or lung: Secondary | ICD-10-CM | POA: Diagnosis not present

## 2023-10-14 DIAGNOSIS — R531 Weakness: Secondary | ICD-10-CM | POA: Diagnosis not present

## 2023-10-14 DIAGNOSIS — F419 Anxiety disorder, unspecified: Secondary | ICD-10-CM | POA: Diagnosis not present

## 2023-10-14 DIAGNOSIS — B179 Acute viral hepatitis, unspecified: Secondary | ICD-10-CM | POA: Diagnosis not present

## 2023-10-14 LAB — COMPREHENSIVE METABOLIC PANEL
ALT: 992 U/L — ABNORMAL HIGH (ref 0–44)
AST: 213 U/L — ABNORMAL HIGH (ref 15–41)
Albumin: 3 g/dL — ABNORMAL LOW (ref 3.5–5.0)
Alkaline Phosphatase: 81 U/L (ref 38–126)
Anion gap: 9 (ref 5–15)
BUN: 18 mg/dL (ref 8–23)
CO2: 18 mmol/L — ABNORMAL LOW (ref 22–32)
Calcium: 8.2 mg/dL — ABNORMAL LOW (ref 8.9–10.3)
Chloride: 108 mmol/L (ref 98–111)
Creatinine, Ser: 0.84 mg/dL (ref 0.44–1.00)
GFR, Estimated: >60 mL/min (ref >60)
Glucose, Bld: 142 mg/dL — ABNORMAL HIGH (ref 70–99)
Potassium: 4.2 mmol/L (ref 3.5–5.1)
Sodium: 135 mmol/L (ref 135–145)
Total Bilirubin: 0.9 mg/dL (ref <1.2)
Total Protein: 5.2 g/dL — ABNORMAL LOW (ref 6.5–8.1)

## 2023-10-14 LAB — CBC
HCT: 42 % (ref 36.0–46.0)
Hemoglobin: 13.8 g/dL (ref 12.0–15.0)
MCH: 29.5 pg (ref 26.0–34.0)
MCHC: 32.9 g/dL (ref 30.0–36.0)
MCV: 89.7 fL (ref 80.0–100.0)
Platelets: 76 10*3/uL — ABNORMAL LOW (ref 150–400)
RBC: 4.68 MIL/uL (ref 3.87–5.11)
RDW: 13.3 % (ref 11.5–15.5)
WBC: 5.5 10*3/uL (ref 4.0–10.5)
nRBC: 0 % (ref 0.0–0.2)

## 2023-10-14 LAB — PROTIME-INR
INR: 1.5 — ABNORMAL HIGH (ref 0.8–1.2)
Prothrombin Time: 17.9 s — ABNORMAL HIGH (ref 11.4–15.2)

## 2023-10-14 MED ORDER — PREDNISONE 20 MG PO TABS
50.0000 mg | ORAL_TABLET | Freq: Every day | ORAL | Status: DC
  Administered 2023-10-15: 50 mg via ORAL
  Filled 2023-10-14: qty 1

## 2023-10-14 MED ORDER — BOOST / RESOURCE BREEZE PO LIQD CUSTOM
1.0000 | Freq: Three times a day (TID) | ORAL | Status: DC
  Administered 2023-10-14 (×2): 1 via ORAL

## 2023-10-14 MED ORDER — BOOST / RESOURCE BREEZE PO LIQD CUSTOM: 1.0000 | Freq: Three times a day (TID) | ORAL | Status: DC

## 2023-10-14 NOTE — Plan of Care (Signed)
  Problem: Acute Rehab PT Goals(only PT should resolve) Goal: Pt Will Go Supine/Side To Sit Outcome: Progressing Flowsheets (Taken 10/14/2023 1412) Pt will go Supine/Side to Sit: with supervision Goal: Patient Will Transfer Sit To/From Stand Outcome: Progressing Flowsheets (Taken 10/14/2023 1412) Patient will transfer sit to/from stand: with supervision Goal: Pt Will Transfer Bed To Chair/Chair To Bed Outcome: Progressing Flowsheets (Taken 10/14/2023 1412) Pt will Transfer Bed to Chair/Chair to Bed: with supervision Goal: Pt Will Ambulate Outcome: Progressing Flowsheets (Taken 10/14/2023 1412) Pt will Ambulate:  75 feet  with supervision  with rolling walker   2:12 PM, 10/14/23 Ocie Bob, MPT Physical Therapist with [facility] [phone] office [phone] mobile phone

## 2023-10-14 NOTE — TOC CM/SW Note (Signed)
Transition of Care [facility]) - Inpatient Brief Assessment   Patient Details  Name: Patricia [name] MRN: [id number] Date of Birth: February 15, 1946  Transition [facility]) CM/SW Contact:    Villa Herb, LCSWA Phone Number: 10/14/2023, 10:12 AM   Clinical Narrative: Transition of Care Department [facility]) has reviewed patient and no TOC needs have been identified at this time. We will continue to monitor patient advancement through interdisciplinary progression rounds. If new patient transition needs arise, please place a TOC consult.  Transition of Care Asessment: Insurance and Status: Insurance coverage has been reviewed Patient has primary care physician: Yes Home environment has been reviewed: From home Prior level of function:: Independent Prior/Current Home Services: No current home services Social Determinants of Health Reivew: SDOH reviewed no interventions necessary Readmission risk has been reviewed: Yes Transition of care needs: no transition of care needs at this time

## 2023-10-14 NOTE — Evaluation (Signed)
Physical Therapy Evaluation Patient Details Name: Patricia [name] MRN: [id number] DOB: 1946/11/20 Today's Date: 10/14/2023  History of Present Illness  Patricia [name] is a 77 year old female with a history of Stage IV (T1c, N3, M1 C) non-small cell lung cancer diagnosed Jan 2024, hypertension, hyperlipidemia, hypothyroidism, depression/anxiety GERD presenting with at least 1 week history of generalized weakness and decreased oral intake that significantly worsened over the past 2 to 3 days.  Notably, the patient recently started adagrasib 600 mg twice daily on 09/26/2023 for her lung cancer.  After 3 days of the medication, the patient began experiencing loose stools without hematochezia or melena.  She states that she began developing nausea and decreased oral intake.  She states that her symptoms have continued to progress to the point where she has had worsening generalized weakness to the point she is not able to get out of bed in the past week.  She states that she stopped taking adagrasib, the last dose being 10/10/2023.  Since then, she states that her loose stools have slowed down.  She has had about 2-3 bowel movements per day.  She denies any frank abdominal pain but has some general upper abdominal discomfort.  She denies any fevers, chills, chest pain, shortness of breath, cough, hemoptysis, emesis.  There is no hematochezia or melena.  She denies any dysuria.  She states that her nausea has persisted despite premedicating with Zofran.     Notably, the patient finished 8 cycles of Keytruda.  She was hospitalized from 06/22/2023 to 06/26/2023 with diarrhea and was diagnosed with colitis noted on CT with wall thickening of the ascending colon.  She underwent colonoscopy on 06/24/2023.  The pathology showed lymphocytic colitis.  The patient finished 8 weeks of budesonide with improvement of her symptoms.   Clinical Impression  Patient demonstrates labored movement for sitting up at bedside, unsteady  on feet having to walk with wide base of support when taking steps without AD, safer using RW and able to ambulate in room/hallway without loss of balance.  Patient requires assistance for moving legs when getting back into bed.  Patient will benefit from continued skilled physical therapy in hospital and recommended venue below to increase strength, balance, endurance for safe ADLs and gait.          If plan is discharge home, recommend the following: A little help with walking and/or transfers;A little help with bathing/dressing/bathroom;Help with stairs or ramp for entrance;Assistance with cooking/housework   Can travel by private vehicle        Equipment Recommendations None recommended by PT  Recommendations for Other Services       Functional Status Assessment Patient has had a recent decline in their functional status and demonstrates the ability to make significant improvements in function in a reasonable and predictable amount of time.     Precautions / Restrictions Precautions Precautions: Fall Restrictions Weight Bearing Restrictions: No      Mobility  Bed Mobility Overal bed mobility: Needs Assistance Bed Mobility: Supine to Sit, Sit to Supine     Supine to sit: Supervision, Contact guard Sit to supine: Min assist   General bed mobility comments: required assistance for moving BLE when going back to bed    Transfers Overall transfer level: Needs assistance Equipment used: Rolling walker (2 wheels) Transfers: Sit to/from Stand, Bed to chair/wheelchair/BSC Sit to Stand: Supervision, Contact guard assist   Step pivot transfers: Supervision, Contact guard assist       General transfer comment:  unsteady labored movement with wide base of support when taking steps without AD, safer using RW    Ambulation/Gait Ambulation/Gait assistance: Contact guard assist, Supervision Gait Distance (Feet): 50 Feet Assistive device: Rolling walker (2 wheels) Gait  Pattern/deviations: Decreased step length - right, Decreased step length - left, Decreased stride length Gait velocity: decreased     General Gait Details: slow slightly labored cadence wihtout loss of balance using RW, limited mostly due to fatigue  Stairs            Wheelchair Mobility     Tilt Bed    Modified Rankin (Stroke Patients Only)       Balance Overall balance assessment: Needs assistance Sitting-balance support: Feet supported, No upper extremity supported Sitting balance-Leahy Scale: Good Sitting balance - Comments: seated at EOB   Standing balance support: During functional activity, No upper extremity supported Standing balance-Leahy Scale: Poor Standing balance comment: fair/poor without AD, fair/good using RW                             Pertinent Vitals/Pain Pain Assessment Pain Assessment: No/denies pain    Home Living Family/patient expects to be discharged to:: Private residence Living Arrangements: Spouse/significant other Available Help at Discharge: Family;Available 24 hours/day Type of Home: House Home Access: Stairs to enter Entrance Stairs-Rails: Right;Left;Can reach both Entrance Stairs-Number of Steps: 4   Home Layout: One level Home Equipment: None      Prior Function Prior Level of Function : Independent/Modified Independent             Mobility Comments: household and short distanced community ambulator without AD ADLs Comments: Independent     Extremity/Trunk Assessment   Upper Extremity Assessment Upper Extremity Assessment: Overall WFL for tasks assessed    Lower Extremity Assessment Lower Extremity Assessment: Generalized weakness    Cervical / Trunk Assessment Cervical / Trunk Assessment: Normal  Communication   Communication Communication: Difficulty communicating thoughts/reduced clarity of speech  Cognition Arousal: Alert Behavior During Therapy: WFL for tasks assessed/performed Overall  Cognitive Status: Within Functional Limits for tasks assessed                                          General Comments      Exercises     Assessment/Plan    PT Assessment Patient needs continued PT services  PT Problem List Decreased strength;Decreased activity tolerance;Decreased balance;Decreased mobility       PT Treatment Interventions DME instruction;Gait training;Stair training;Functional mobility training;Therapeutic activities;Therapeutic exercise;Balance training;Patient/family education    PT Goals (Current goals can be found in the Care Plan section)  Acute Rehab PT Goals Patient Stated Goal: return home with family to assist PT Goal Formulation: With patient/family Time For Goal Achievement: 10/18/23 Potential to Achieve Goals: Good    Frequency Min 3X/week     Co-evaluation               AM-PAC PT "6 Clicks" Mobility  Outcome Measure Help needed turning from your back to your side while in a flat bed without using bedrails?: A Little Help needed moving from lying on your back to sitting on the side of a flat bed without using bedrails?: A Little Help needed moving to and from a bed to a chair (including a wheelchair)?: A Little Help needed standing up from a chair using  your arms (e.g., wheelchair or bedside chair)?: A Little Help needed to walk in hospital room?: A Little Help needed climbing 3-5 steps with a railing? : A Lot 6 Click Score: 17    End of Session   Activity Tolerance: Patient tolerated treatment well;Patient limited by fatigue Patient left: in bed;with call bell/phone within reach;with family/visitor present Nurse Communication: Mobility status PT Visit Diagnosis: Unsteadiness on feet (R26.81);Other abnormalities of gait and mobility (R26.89);Muscle weakness (generalized) (M62.81)    Time: 1050-1104 PT Time Calculation (min) (ACUTE ONLY): 14 min   Charges:   PT Evaluation $PT Eval Low Complexity: 1 Low PT  Treatments $Therapeutic Activity: 8-22 mins PT General Charges $$ ACUTE PT VISIT: 1 Visit         2:11 PM, 10/14/23 Ocie Bob, MPT Physical Therapist with [facility] 336 [phone] office [phone] mobile phone

## 2023-10-14 NOTE — Progress Notes (Signed)
PROGRESS NOTE  Patricia [name] VWU:[id number] DOB: 29-Jun-1946 DOA: 10/13/2023 PCP: [name], MD  Brief History:  77 year old female with a history of Stage IV (T1c, N3, M1 C) non-small cell lung cancer diagnosed Jan 2024, hypertension, hyperlipidemia, hypothyroidism, depression/anxiety GERD presenting with at least 1 week history of generalized weakness and decreased oral intake that significantly worsened over the past 2 to 3 days.  Notably, the patient recently started adagrasib 600 mg twice daily on 09/26/2023 for her lung cancer.  After 3 days of the medication, the patient began experiencing loose stools without hematochezia or melena.  She states that she began developing nausea and decreased oral intake.  She states that her symptoms have continued to progress to the point where she has had worsening generalized weakness to the point she is not able to get out of bed in the past week.  She states that she stopped taking adagrasib, the last dose being 10/10/2023.  Since then, she states that her loose stools have slowed down.  She has had about 2-3 bowel movements per day.  She denies any frank abdominal pain but has some general upper abdominal discomfort.  She denies any fevers, chills, chest pain, shortness of breath, cough, hemoptysis, emesis.  There is no hematochezia or melena.  She denies any dysuria.  She states that her nausea has persisted despite premedicating with Zofran.  Notably, the patient finished 8 cycles of Keytruda.  She was hospitalized from 06/22/2023 to 06/26/2023 with diarrhea and was diagnosed with colitis noted on CT with wall thickening of the ascending colon.  She underwent colonoscopy on 06/24/2023.  The pathology showed lymphocytic colitis.  The patient finished 8 weeks of budesonide with improvement of her symptoms.  In the ED, the patient was afebrile hemodynamically stable with oxygen saturation 96% room air.  WBC 8.6, hemoglobin 14.6, platelets 87,000.   Sodium 136, potassium 3.6, bicarbonate 19, serum creatinine 1.04.  AST 351, ALT 1214, alk phosphatase 74, total bilirubin 0.9.  EKG shows sinus rhythm with nonspecific ST changes.  Chest x-ray was negative for any infiltrates or edema.  Abdominal ultrasound showed increased hepatic echogenicity without cholecystitis.  There was left nephrolithiasis that was nonobstructive.  The patient was admitted for further workup and treatment of her failure to thrive and acute hepatitis.   Assessment/Plan: Transaminasemia/acute hepatitis -Likely secondary to adagrasib -holding adagrasib -Abdominal ultrasound--increased hepatic echogenicity, no cholecystitis -Trend LFTs>>trending down -Viral hepatitis panel--neg -Acetaminophen level negative -Check INR--1.5 -Dr. Arbutus Ped notified>>recommends prednisone 1 mg/kg daily till she follow up with him in office   Failure to thrive -B12 -Folic acid -TSH -PT evaluation>>HHPT -UA--no pyuria   Thrombocytopenia -Workup as discussed above -Likely secondary to adagrasib   Intractable nausea -Started around-the-clock compazine -Full liquid diet>>soft diet -Started IV fluids -PPI twice daily   Diarrhea -Stool for C. Difficile -Stool pathogen panel -resolved--no BM to collect   Anxiety/depression -Continue sertraline and alprazolam   Stage 4 NSCLC -Status post 9 cycles Keytruda, but developed immune mediated -Outpatient follow-up with Dr. Shirline Frees   History of CVA -started on apixaban 01/2023 for embolic stroke -plan to increase dose back to 5 mg bid at time of d/c as pt is now of adagrasib           Family Communication:   spouse and son updated 11/5  Consultants:  none  Code Status:   DNR  DVT Prophylaxis:  apixabab   Procedures: As Listed in Progress  Note Above  Antibiotics: None     Subjective: Pt is feeling better today.  Tolerating liquids.  Denies f/c, cp, sob, n/v/d, abd pain  Objective: Vitals:   10/13/23 1946  10/14/23 0007 10/14/23 0400 10/14/23 1306  BP: 117/74 135/75 125/75 123/76  Pulse: 67 69 75 71  Resp: 20 19 18 17   Temp: 98.3 F (36.8 C) 97.6 F (36.4 C) 98.2 F (36.8 C) 98.2 F (36.8 C)  TempSrc: Oral Oral Oral   SpO2: 99% 96% 96% 97%  Weight:      Height:        Intake/Output Summary (Last 24 hours) at 10/14/2023 1711 Last data filed at 10/14/2023 1400 Gross per 24 hour  Intake 2359.69 ml  Output --  Net 2359.69 ml   Weight change:  Exam:  General:  Pt is alert, follows commands appropriately, not in acute distress HEENT: No icterus, No thrush, No neck mass, [place]/AT Cardiovascular: RRR, S1/S2, no rubs, no gallops Respiratory: bibasilar rales, no wheeze Abdomen: Soft/+BS, non tender, non distended, no guarding Extremities: No edema, No lymphangitis, No petechiae, No rashes, no synovitis   Data Reviewed: I have personally reviewed following labs and imaging studies Basic Metabolic Panel: Recent Labs  Lab 10/13/23 1042 10/14/23 0459  NA 136 135  K 3.6 4.2  CL 107 108  CO2 19* 18*  GLUCOSE 85 142*  BUN 27* 18  CREATININE 1.04* 0.84  CALCIUM 8.6* 8.2*   Liver Function Tests: Recent Labs  Lab 10/13/23 1042 10/14/23 0459  AST 351* 213*  ALT 1,314* 992*  ALKPHOS 74 81  BILITOT 0.9 0.9  PROT 5.7* 5.2*  ALBUMIN 3.4* 3.0*   No results for input(s): "LIPASE", "AMYLASE" in the last 168 hours. No results for input(s): "AMMONIA" in the last 168 hours. Coagulation Profile: Recent Labs  Lab 10/14/23 0459  INR 1.5*   CBC: Recent Labs  Lab 10/13/23 1042 10/14/23 0459  WBC 8.6 5.5  NEUTROABS 6.9  --   HGB 14.6 13.8  HCT 43.9 42.0  MCV 90.0 89.7  PLT 87* 76*   Cardiac Enzymes: No results for input(s): "CKTOTAL", "CKMB", "CKMBINDEX", "TROPONINI" in the last 168 hours. BNP: Invalid input(s): "POCBNP" CBG: No results for input(s): "GLUCAP" in the last 168 hours. HbA1C: No results for input(s): "HGBA1C" in the last 72 hours. Urine analysis:     Component Value Date/Time   COLORURINE AMBER (A) 10/13/2023 1023   APPEARANCEUR HAZY (A) 10/13/2023 1023   LABSPEC 1.020 10/13/2023 1023   PHURINE 5.0 10/13/2023 1023   GLUCOSEU NEGATIVE 10/13/2023 1023   HGBUR SMALL (A) 10/13/2023 1023   BILIRUBINUR NEGATIVE 10/13/2023 1023   KETONESUR 20 (A) 10/13/2023 1023   PROTEINUR 100 (A) 10/13/2023 1023   UROBILINOGEN 0.2 01/12/2014 1045   NITRITE NEGATIVE 10/13/2023 1023   LEUKOCYTESUR NEGATIVE 10/13/2023 1023   Sepsis Labs: @LABRCNTIP (procalcitonin:4,lacticidven:4) ) Recent Results (from the past 240 hour(s))  SARS Coronavirus 2 by RT PCR (hospital order, performed in [facility] hospital lab) *cepheid single result test* Anterior Nasal Swab     Status: None   Collection Time: 10/13/23  4:24 PM   Specimen: Anterior Nasal Swab  Result Value Ref Range Status   SARS Coronavirus 2 by RT PCR NEGATIVE NEGATIVE Final    Comment: (NOTE) SARS-CoV-2 target nucleic acids are NOT DETECTED.  The SARS-CoV-2 RNA is generally detectable in upper and lower respiratory specimens during the acute phase of infection. The lowest concentration of SARS-CoV-2 viral copies this assay can detect is 250  copies / mL. A negative result does not preclude SARS-CoV-2 infection and should not be used as the sole basis for treatment or other patient management decisions.  A negative result may occur with improper specimen collection / handling, submission of specimen other than nasopharyngeal swab, presence of viral mutation(s) within the areas targeted by this assay, and inadequate number of viral copies (<250 copies / mL). A negative result must be combined with clinical observations, patient history, and epidemiological information.  Fact Sheet for Patients:   RoadLapTop.co.za  Fact Sheet for Healthcare Providers: http://kim-miller.com/  This test is not yet approved or  cleared by the [place] FDA and has  been authorized for detection and/or diagnosis of SARS-CoV-2 by FDA under an Emergency Use Authorization (EUA).  This EUA will remain in effect (meaning this test can be used) for the duration of the COVID-19 declaration under Section 564(b)(1) of the Act, 21 U.S.C. section 360bbb-3(b)(1), unless the authorization is terminated or revoked sooner.  Performed at [facility], [address], [place], [place] [address]      Scheduled Meds:  apixaban  2.5 mg Oral BID   feeding supplement  1 Container Oral TID BM   levothyroxine  50 mcg Oral QAC breakfast   [START ON 10/15/2023] predniSONE  50 mg Oral Q breakfast   prochlorperazine  10 mg Intravenous Q6H   sertraline  50 mg Oral Daily   topiramate  50 mg Oral BID   Continuous Infusions:  Procedures/Studies: US Abdomen Complete  Result Date: 10/13/2023 CLINICAL DATA:  Transaminitis EXAM: ABDOMEN ULTRASOUND COMPLETE COMPARISON:  None Available. FINDINGS: Gallbladder: No gallstones or wall thickening visualized. No sonographic Murphy sign noted by sonographer. Common bile duct: Diameter: 5 mm Liver: Increased echogenicity. No focal lesion. Portal vein is patent on color Doppler imaging with normal direction of blood flow towards the liver. IVC: No abnormality visualized. Pancreas: Not visualized due to overlying bowel gas. Spleen: Size and appearance within normal limits. Right Kidney: Length: 9.7 cm. Echogenicity within normal limits. No mass or hydronephrosis visualized. Left Kidney: Length: 11.2 cm. Echogenicity within normal limits. No mass or hydronephrosis visualized. 6 mm stone midpole left kidney. Abdominal aorta: No aneurysm visualized. Other findings: None. IMPRESSION: 1. Increased hepatic parenchymal echogenicity suggestive of steatosis. 2. No cholelithiasis or sonographic evidence for acute cholecystitis. 3. Left nephrolithiasis. Electronically Signed   By: Annia Belt M.D.   On: 10/13/2023 12:44   DG Chest 1 View  Result Date:  10/13/2023 CLINICAL DATA:  Weakness, malaise. EXAM: CHEST  1 VIEW COMPARISON:  January 20, 2023. FINDINGS: The heart size and mediastinal contours are within normal limits. Moderate size hiatal hernia is noted. Both lungs are clear. The visualized skeletal structures are unremarkable. IMPRESSION: No active disease.  Moderate size hiatal hernia. Aortic Atherosclerosis (ICD10-I70.0). Electronically Signed   By: Lupita Raider M.D.   On: 10/13/2023 10:47   CT CHEST ABDOMEN PELVIS W CONTRAST  Result Date: 09/24/2023 CLINICAL DATA:  Non-small cell lung cancer staging. * Tracking Code: BO * EXAM: CT CHEST, ABDOMEN, AND PELVIS WITH CONTRAST TECHNIQUE: Multidetector CT imaging of the chest, abdomen and pelvis was performed following the standard protocol during bolus administration of intravenous contrast. RADIATION DOSE REDUCTION: This exam was performed according to the departmental dose-optimization program which includes automated exposure control, adjustment of the mA and/or kV according to patient size and/or use of iterative reconstruction technique. CONTRAST:  80mL OMNIPAQUE IOHEXOL 300 MG/ML  SOLN COMPARISON:  CT 07/22/2023 FINDINGS: CT CHEST FINDINGS  Cardiovascular: No significant vascular findings. Normal heart size. No pericardial effusion. Mediastinum/Nodes: No axillary or supraclavicular adenopathy. No mediastinal or hilar adenopathy. No pericardial fluid. Esophagus normal. Lungs/Pleura: 9 mm nodule in the LEFT lower lobe (image 73/4) is unchanged from 10 mm comparison exam. Previous described ground-glass nodule in the RIGHT upper lobe is less conspicuous (image 50/4). No new pulmonary nodules. Musculoskeletal: No aggressive osseous lesion. CT ABDOMEN AND PELVIS FINDINGS Hepatobiliary: No focal hepatic lesion. No biliary ductal dilatation. Gallbladder is normal. Common bile duct is normal. Pancreas: Pancreas is normal. No ductal dilatation. No pancreatic inflammation. Spleen: Normal spleen  Adrenals/urinary tract: Adrenal glands and kidneys are normal. The ureters and bladder normal. Stomach/Bowel: Large hiatal hernia. Stomach, small bowel normal. The colon and rectosigmoid colon are normal. Vascular/Lymphatic: Abdominal aorta is normal caliber with atherosclerotic calcification. There is no retroperitoneal or periportal lymphadenopathy. No pelvic lymphadenopathy. Reproductive: Unremarkable Other: No free fluid. Musculoskeletal: Sclerotic lesion in the RIGHT sacral ala not changed from prior measures 6 mm. No aggressive osseous lesions identified. IMPRESSION: CHEST: 1. Stable LEFT lower lobe pulmonary 9 mm nodule. 2. No new pulmonary nodules. 3. No thoracic adenopathy. PELVIS: 1. No evidence of metastatic disease in the abdomen pelvis. 2. Stable benign-appearing sclerotic lesion in the RIGHT sacral ala. 3. Large hiatal hernia. Electronically Signed   By: Genevive Bi M.D.   On: 09/24/2023 12:12    Catarina Hartshorn, DO  [facility]  If 7PM-7AM, please contact night-coverage [email or web address] Password [email or web address] 10/14/2023, 5:11 PM   LOS: 0 days

## 2023-10-14 NOTE — Plan of Care (Signed)
  Problem: Clinical Measurements: Goal: Ability to maintain clinical measurements within normal limits will improve Outcome: Progressing Goal: Diagnostic test results will improve Outcome: Progressing Goal: Cardiovascular complication will be avoided Outcome: Progressing   Problem: Activity: Goal: Risk for activity intolerance will decrease Outcome: Progressing   Problem: Pain Management: Goal: General experience of comfort will improve Outcome: Progressing   Problem: Safety: Goal: Ability to remain free from injury will improve Outcome: Progressing   Problem: Skin Integrity: Goal: Risk for impaired skin integrity will decrease Outcome: Progressing

## 2023-10-14 NOTE — Telephone Encounter (Signed)
.  She is still in hospital. Pt thinks the Patricia [name] is making her sick.

## 2023-10-15 DIAGNOSIS — E039 Hypothyroidism, unspecified: Secondary | ICD-10-CM | POA: Diagnosis not present

## 2023-10-15 DIAGNOSIS — E782 Mixed hyperlipidemia: Secondary | ICD-10-CM

## 2023-10-15 DIAGNOSIS — R627 Adult failure to thrive: Secondary | ICD-10-CM | POA: Diagnosis not present

## 2023-10-15 DIAGNOSIS — B179 Acute viral hepatitis, unspecified: Secondary | ICD-10-CM | POA: Diagnosis not present

## 2023-10-15 LAB — COMPREHENSIVE METABOLIC PANEL
ALT: 676 U/L — ABNORMAL HIGH (ref 0–44)
AST: 115 U/L — ABNORMAL HIGH (ref 15–41)
Albumin: 2.6 g/dL — ABNORMAL LOW (ref 3.5–5.0)
Alkaline Phosphatase: 70 U/L (ref 38–126)
Anion gap: 4 — ABNORMAL LOW (ref 5–15)
BUN: 15 mg/dL (ref 8–23)
CO2: 20 mmol/L — ABNORMAL LOW (ref 22–32)
Calcium: 8.4 mg/dL — ABNORMAL LOW (ref 8.9–10.3)
Chloride: 111 mmol/L (ref 98–111)
Creatinine, Ser: 0.75 mg/dL (ref 0.44–1.00)
GFR, Estimated: >60 mL/min (ref >60)
Glucose, Bld: 166 mg/dL — ABNORMAL HIGH (ref 70–99)
Potassium: 3.8 mmol/L (ref 3.5–5.1)
Sodium: 135 mmol/L (ref 135–145)
Total Bilirubin: 0.7 mg/dL (ref <1.2)
Total Protein: 4.5 g/dL — ABNORMAL LOW (ref 6.5–8.1)

## 2023-10-15 LAB — MAGNESIUM: Magnesium: 2 mg/dL (ref 1.7–2.4)

## 2023-10-15 LAB — TSH: TSH: 2.214 u[IU]/mL (ref 0.350–4.500)

## 2023-10-15 LAB — T4, FREE: Free T4: 0.6 ng/dL — ABNORMAL LOW (ref 0.61–1.12)

## 2023-10-15 LAB — URINE CULTURE: Culture: >=100000 — AB

## 2023-10-15 LAB — FOLATE: Folate: 7.5 ng/mL (ref >5.9)

## 2023-10-15 LAB — VITAMIN B12: Vitamin B-12: 970 pg/mL — ABNORMAL HIGH (ref 180–914)

## 2023-10-15 MED ORDER — APIXABAN 5 MG PO TABS: 5.0000 mg | ORAL_TABLET | Freq: Two times a day (BID) | ORAL | 1 refills | Status: DC

## 2023-10-15 MED ORDER — POTASSIUM CHLORIDE CRYS ER 20 MEQ PO TBCR: 10.0000 meq | EXTENDED_RELEASE_TABLET | Freq: Two times a day (BID) | ORAL | Status: DC

## 2023-10-15 MED ORDER — PREDNISONE 50 MG PO TABS: 50.0000 mg | ORAL_TABLET | Freq: Every day | ORAL | 0 refills | Status: AC

## 2023-10-15 NOTE — Plan of Care (Signed)

## 2023-10-15 NOTE — Plan of Care (Signed)

## 2023-10-15 NOTE — Discharge Instructions (Addendum)
PLEASE HOLD ENTOCORT WHILE YOU ARE ON DAILY PREDNISONE.  YOU CAN RESUME AFTER COMPLETING PREDNISONE THERAPY IF OK WITH ONCOLOGIST AND GASTROENTEROLOGIST.   PLEASE CONTINUE DAILY PREDNISONE UNTIL YOU FOLLOW UP WITH YOUR ONCOLOGIST FOR FURTHER RECOMMENDATIONS.     IMPORTANT INFORMATION: PAY CLOSE ATTENTION   PHYSICIAN DISCHARGE INSTRUCTIONS  Follow with Primary care provider  [name], MD  and other consultants as instructed by your Hospitalist Physician  SEEK MEDICAL CARE OR RETURN TO EMERGENCY ROOM IF SYMPTOMS COME BACK, WORSEN OR NEW PROBLEM DEVELOPS   Please note: You were cared for by a hospitalist during your hospital stay. Every effort will be made to forward records to your primary care provider.  You can request that your primary care provider send for your hospital records if they have not received them.  Once you are discharged, your primary care physician will handle any further medical issues. Please note that NO REFILLS for any discharge medications will be authorized once you are discharged, as it is imperative that you return to your primary care physician (or establish a relationship with a primary care physician if you do not have one) for your post hospital discharge needs so that they can reassess your need for medications and monitor your lab values.  Please get a complete blood count and chemistry panel checked by your Primary MD at your next visit, and again as instructed by your Primary MD.  Get Medicines reviewed and adjusted: Please take all your medications with you for your next visit with your Primary MD  Laboratory/radiological data: Please request your Primary MD to go over all hospital tests and procedure/radiological results at the follow up, please ask your primary care provider to get all Hospital records sent to his/her office.  In some cases, they will be blood work, cultures and biopsy results pending at the time of your discharge. Please request  that your primary care provider follow up on these results.  If you are diabetic, please bring your blood sugar readings with you to your follow up appointment with primary care.    Please call and make your follow up appointments as soon as possible.    Also Note the following: If you experience worsening of your admission symptoms, develop shortness of breath, life threatening emergency, suicidal or homicidal thoughts you must seek medical attention immediately by calling 911 or calling your MD immediately  if symptoms less severe.  You must read complete instructions/literature along with all the possible adverse reactions/side effects for all the Medicines you take and that have been prescribed to you. Take any new Medicines after you have completely understood and accpet all the possible adverse reactions/side effects.   Do not drive when taking Pain medications or sleeping medications (Benzodiazepines)  Do not take more than prescribed Pain, Sleep and Anxiety Medications. It is not advisable to combine anxiety,sleep and pain medications without talking with your primary care practitioner  Special Instructions: If you have smoked or chewed Tobacco  in the last 2 yrs please stop smoking, stop any regular Alcohol  and or any Recreational drug use.  Wear Seat belts while driving.  Do not drive if taking any narcotic, mind altering or controlled substances or recreational drugs or alcohol.

## 2023-10-15 NOTE — TOC Transition Note (Addendum)
Transition of Care [facility]) - CM/SW Discharge Note   Patient Details  Name: Patricia [name] MRN: [id number] Date of Birth: [birth year]  [facility] CM/SW Contact:  Villa Herb, LCSWA Phone Number: 10/15/2023, 12:01 PM   Clinical Narrative:    CSW updated that PT is recommending HH PT for pt at D/C. CSW spoke with pts spouse who is in room with pt to review this recommendation. Pt declines HH need and prefers that we do not set up these services. CSW explained if they were to change their mind they can reach out to pts PCP to get services set up. Pt and spouse understanding of this and continue to decline need for [facility]. TOC signing off.   CSW updated by RN that pt is now requesting [facility] services. CSW spoke to Maralyn Sago with [facility] who accepts referral for [facility] PT/OT. HH orders in place.   Final next level of care: Home/Self Care Barriers to Discharge: Barriers Resolved   Patient Goals and CMS Choice CMS Medicare.gov Compare Post Acute Care list provided to:: Patient Choice offered to / list presented to : Patient, Spouse  Discharge Placement                         Discharge Plan and Services Additional resources added to the After Visit Summary for                                       Social Determinants of Health (SDOH) Interventions SDOH Screenings   Food Insecurity: No Food Insecurity (10/13/2023)  Housing: Low Risk  (10/13/2023)  Transportation Needs: No Transportation Needs (10/13/2023)  Utilities: Not At Risk (10/13/2023)  Depression (PHQ2-9): High Risk (08/20/2023)  Social Connections: Unknown (04/20/2022)   Received from [facility]  Tobacco Use: Medium Risk (10/13/2023)     Readmission Risk Interventions    06/23/2023    9:18 AM  Readmission Risk Prevention Plan  Transportation Screening Complete  Medication Review [employer]) Complete  HRI or Home Care Consult Complete  SW Recovery Care/Counseling Consult  Complete  Palliative Care Screening Not Applicable  Skilled Nursing Facility Not Applicable

## 2023-10-15 NOTE — Discharge Summary (Signed)
Physician Discharge Summary  Patricia [name] YQM:[id number] DOB: [birth year] DOA: 10/13/2023  PCP: [name], MD Oncologist: Dr. Arbutus Ped  GI: Dr. Jena Gauss  Admit date: 10/13/2023 Discharge date: 10/15/2023  Admitted From:  Home  Disposition: [facility] with [facility]   Recommendations for Outpatient Follow-up:  Follow up with Dr. Arbutus Ped in 1 weeks at [facility]  Follow up with PCP in 2 weeks Follow up with GI in 2-3 weeks  Please recheck CMP in one week to follow liver enzymes  Home Health: PT/OT   Discharge Condition: STABLE   CODE STATUS: DNR DIET: resume previous home diet    Brief Hospitalization Summary: Please see all hospital notes, images, labs for full details of the hospitalization. ADMISSION PROVIDER HPI:  77 year old female with a history of Stage IV (T1c, N3, M1 C) non-small cell lung cancer diagnosed Jan 2024, hypertension, hyperlipidemia, hypothyroidism, depression/anxiety GERD presenting with at least 1 week history of generalized weakness and decreased oral intake that significantly worsened over the past 2 to 3 days.  Notably, the patient recently started adagrasib 600 mg twice daily on 09/26/2023 for her lung cancer.  After 3 days of the medication, the patient began experiencing loose stools without hematochezia or melena.  She states that she began developing nausea and decreased oral intake.  She states that her symptoms have continued to progress to the point where she has had worsening generalized weakness to the point she is not able to get out of bed in the past week.  She states that she stopped taking adagrasib, the last dose being 10/10/2023.  Since then, she states that her loose stools have slowed down.  She has had about 2-3 bowel movements per day.  She denies any frank abdominal pain but has some general upper abdominal discomfort.  She denies any fevers, chills, chest pain, shortness of breath, cough, hemoptysis, emesis.  There is no hematochezia or melena.  She  denies any dysuria.  She states that her nausea has persisted despite premedicating with Zofran.  Notably, the patient finished 8 cycles of Keytruda.  She was hospitalized from 06/22/2023 to 06/26/2023 with diarrhea and was diagnosed with colitis noted on CT with wall thickening of the ascending colon.  She underwent colonoscopy on 06/24/2023.  The pathology showed lymphocytic colitis.  The patient finished 8 weeks of budesonide with improvement of her symptoms.  In the ED, the patient was afebrile hemodynamically stable with oxygen saturation 96% room air.  WBC 8.6, hemoglobin 14.6, platelets 87,000.  Sodium 136, potassium 3.6, bicarbonate 19, serum creatinine 1.04.  AST 351, ALT 1214, alk phosphatase 74, total bilirubin 0.9.  EKG shows sinus rhythm with nonspecific ST changes.  Chest x-ray was negative for any infiltrates or edema.  Abdominal ultrasound showed increased hepatic echogenicity without cholecystitis.  There was left nephrolithiasis that was nonobstructive.  The patient was admitted for further workup and treatment of her failure to thrive and acute hepatitis.  Hospital Course by problem list   Transaminasemia/acute hepatitis -Likely secondary to adagrasib -holding adagrasib -Abdominal ultrasound--increased hepatic echogenicity, no cholecystitis -Trend LFTs>>trending down -Viral hepatitis panel--neg -Acetaminophen level negative -Check INR--1.5 -Dr. Arbutus Ped notified>>recommends prednisone 1 mg/kg daily till she follow up with him in office -DC home on oral prednisone 50 mg daily x 14 day supply for patient to follow up with Dr. Arbutus Ped for further instructions    Failure to thrive -B12 - 970 -Folic acid - 7.5  -TSH - 2.214 -PT evaluation>>HHPT -UA--no pyuria   Thrombocytopenia -Workup as  discussed above -Likely secondary to adagrasib which is on hold    Intractable nausea - resolved  -treated with compazine in hospital  -Full liquid diet>>soft diet   Diarrhea - resolved   -Stool for C. Difficile not obtained due to expired test as diarrhea resolved -resolved--no BM to collect   Anxiety/depression -Continue sertraline and alprazolam   Stage 4 NSCLC -Status post 9 cycles Keytruda -Outpatient follow-up with Dr. Shirline Frees -ambulatory referral sent for urgent follow up   History of CVA -started on apixaban 01/2023 for embolic stroke -plan to increase dose back to 5 mg bid at time of d/c as pt is now off adagrasib   Discharge Diagnoses:  Principal Problem:   Acute hepatitis Active Problems:   Mixed hyperlipidemia   Non-small cell lung cancer, left (HCC)   Acquired hypothyroidism   Diarrhea   Thrombocytopenia (HCC)   Failure to thrive in adult   Discharge Instructions: Discharge Instructions     Ambulatory referral to Hematology / Oncology   Complete by: As directed    Hospital follow up drug reaction      Allergies as of 10/15/2023       Reactions   Codeine Nausea Only        Medication List     STOP taking these medications    adagrasib 200 MG tablet Commonly known as: KRAZATI   budesonide 3 MG 24 hr capsule Commonly known as: ENTOCORT EC   PARoxetine 40 MG tablet Commonly known as: PAXIL   rosuvastatin 20 MG tablet Commonly known as: CRESTOR       TAKE these medications    ALPRAZolam 0.5 MG tablet Commonly known as: XANAX Take 1 tablet (0.5 mg total) by mouth at bedtime as needed for anxiety or sleep. TAKE (1) TABLET BY MOUTH AT BEDTIME AS NEEDED FOR SLEEP.   apixaban 5 MG Tabs tablet Commonly known as: ELIQUIS Take 1 tablet (5 mg total) by mouth 2 (two) times daily. What changed:  medication strength how much to take   famotidine 40 MG tablet Commonly known as: Pepcid Take 1 tablet (40 mg total) by mouth daily.   lansoprazole 30 MG capsule Commonly known as: PREVACID Take 30 mg by mouth daily.   levothyroxine 50 MCG tablet Commonly known as: SYNTHROID TAKE 1 TABLET BY MOUTH DAILY BEFORE BREAKFAST    ondansetron 8 MG tablet Commonly known as: ZOFRAN Take 0.5 tablets (4 mg total) by mouth every 8 (eight) hours as needed for nausea or vomiting.   potassium chloride SA 20 MEQ tablet Commonly known as: Klor-Con M20 Take 0.5 tablets (10 mEq total) by mouth 2 (two) times daily.   predniSONE 50 MG tablet Commonly known as: DELTASONE Take 1 tablet (50 mg total) by mouth daily with breakfast for 14 days. Start taking on: October 16, 2023   sertraline 50 MG tablet Commonly known as: ZOLOFT Take 1 tablet (50 mg total) by mouth daily.   topiramate 100 MG tablet Commonly known as: TOPAMAX Take 100 mg by mouth 2 (two) times daily.        Follow-up Information     Si Gaul, MD. Schedule an appointment as soon as possible for a visit in 1 week(s).   Specialty: Oncology Why: Hospital Follow Up Contact information: [address] [place] [address] [phone]         [name], MD. Schedule an appointment as soon as possible for a visit.   Specialty: Family Medicine Contact information: [address]  [address] [place] [address] [phone]         Corbin Ade, MD. Schedule an appointment as soon as possible for a visit.   Specialty: Gastroenterology Why: Hospital Follow Up Contact information: [address] [place] [address] [phone]                Allergies  Allergen Reactions   Codeine Nausea Only   Allergies as of 10/15/2023       Reactions   Codeine Nausea Only        Medication List     STOP taking these medications    adagrasib 200 MG tablet Commonly known as: KRAZATI   budesonide 3 MG 24 hr capsule Commonly known as: ENTOCORT EC   PARoxetine 40 MG tablet Commonly known as: PAXIL   rosuvastatin 20 MG tablet Commonly known as: CRESTOR       TAKE these medications    ALPRAZolam 0.5 MG tablet Commonly known as: XANAX Take 1 tablet (0.5 mg total) by mouth at bedtime as needed for  anxiety or sleep. TAKE (1) TABLET BY MOUTH AT BEDTIME AS NEEDED FOR SLEEP.   apixaban 5 MG Tabs tablet Commonly known as: ELIQUIS Take 1 tablet (5 mg total) by mouth 2 (two) times daily. What changed:  medication strength how much to take   famotidine 40 MG tablet Commonly known as: Pepcid Take 1 tablet (40 mg total) by mouth daily.   lansoprazole 30 MG capsule Commonly known as: PREVACID Take 30 mg by mouth daily.   levothyroxine 50 MCG tablet Commonly known as: SYNTHROID TAKE 1 TABLET BY MOUTH DAILY BEFORE BREAKFAST   ondansetron 8 MG tablet Commonly known as: ZOFRAN Take 0.5 tablets (4 mg total) by mouth every 8 (eight) hours as needed for nausea or vomiting.   potassium chloride SA 20 MEQ tablet Commonly known as: Klor-Con M20 Take 0.5 tablets (10 mEq total) by mouth 2 (two) times daily.   predniSONE 50 MG tablet Commonly known as: DELTASONE Take 1 tablet (50 mg total) by mouth daily with breakfast for 14 days. Start taking on: October 16, 2023   sertraline 50 MG tablet Commonly known as: ZOLOFT Take 1 tablet (50 mg total) by mouth daily.   topiramate 100 MG tablet Commonly known as: TOPAMAX Take 100 mg by mouth 2 (two) times daily.        Procedures/Studies: US Abdomen Complete  Result Date: 10/13/2023 CLINICAL DATA:  Transaminitis EXAM: ABDOMEN ULTRASOUND COMPLETE COMPARISON:  None Available. FINDINGS: Gallbladder: No gallstones or wall thickening visualized. No sonographic Murphy sign noted by sonographer. Common bile duct: Diameter: 5 mm Liver: Increased echogenicity. No focal lesion. Portal vein is patent on color Doppler imaging with normal direction of blood flow towards the liver. IVC: No abnormality visualized. Pancreas: Not visualized due to overlying bowel gas. Spleen: Size and appearance within normal limits. Right Kidney: Length: 9.7 cm. Echogenicity within normal limits. No mass or hydronephrosis visualized. Left Kidney: Length: 11.2 cm. Echogenicity  within normal limits. No mass or hydronephrosis visualized. 6 mm stone midpole left kidney. Abdominal aorta: No aneurysm visualized. Other findings: None. IMPRESSION: 1. Increased hepatic parenchymal echogenicity suggestive of steatosis. 2. No cholelithiasis or sonographic evidence for acute cholecystitis. 3. Left nephrolithiasis. Electronically Signed   By: Annia Belt M.D.   On: 10/13/2023 12:44   DG Chest 1 View  Result Date: 10/13/2023 CLINICAL DATA:  Weakness, malaise. EXAM: CHEST  1 VIEW COMPARISON:  January 20, 2023. FINDINGS: The heart size and  mediastinal contours are within normal limits. Moderate size hiatal hernia is noted. Both lungs are clear. The visualized skeletal structures are unremarkable. IMPRESSION: No active disease.  Moderate size hiatal hernia. Aortic Atherosclerosis (ICD10-I70.0). Electronically Signed   By: Lupita Raider M.D.   On: 10/13/2023 10:47   CT CHEST ABDOMEN PELVIS W CONTRAST  Result Date: 09/24/2023 CLINICAL DATA:  Non-small cell lung cancer staging. * Tracking Code: BO * EXAM: CT CHEST, ABDOMEN, AND PELVIS WITH CONTRAST TECHNIQUE: Multidetector CT imaging of the chest, abdomen and pelvis was performed following the standard protocol during bolus administration of intravenous contrast. RADIATION DOSE REDUCTION: This exam was performed according to the departmental dose-optimization program which includes automated exposure control, adjustment of the mA and/or kV according to patient size and/or use of iterative reconstruction technique. CONTRAST:  80mL OMNIPAQUE IOHEXOL 300 MG/ML  SOLN COMPARISON:  CT 07/22/2023 FINDINGS: CT CHEST FINDINGS Cardiovascular: No significant vascular findings. Normal heart size. No pericardial effusion. Mediastinum/Nodes: No axillary or supraclavicular adenopathy. No mediastinal or hilar adenopathy. No pericardial fluid. Esophagus normal. Lungs/Pleura: 9 mm nodule in the LEFT lower lobe (image 73/4) is unchanged from 10 mm comparison exam.  Previous described ground-glass nodule in the RIGHT upper lobe is less conspicuous (image 50/4). No new pulmonary nodules. Musculoskeletal: No aggressive osseous lesion. CT ABDOMEN AND PELVIS FINDINGS Hepatobiliary: No focal hepatic lesion. No biliary ductal dilatation. Gallbladder is normal. Common bile duct is normal. Pancreas: Pancreas is normal. No ductal dilatation. No pancreatic inflammation. Spleen: Normal spleen Adrenals/urinary tract: Adrenal glands and kidneys are normal. The ureters and bladder normal. Stomach/Bowel: Large hiatal hernia. Stomach, small bowel normal. The colon and rectosigmoid colon are normal. Vascular/Lymphatic: Abdominal aorta is normal caliber with atherosclerotic calcification. There is no retroperitoneal or periportal lymphadenopathy. No pelvic lymphadenopathy. Reproductive: Unremarkable Other: No free fluid. Musculoskeletal: Sclerotic lesion in the RIGHT sacral ala not changed from prior measures 6 mm. No aggressive osseous lesions identified. IMPRESSION: CHEST: 1. Stable LEFT lower lobe pulmonary 9 mm nodule. 2. No new pulmonary nodules. 3. No thoracic adenopathy. PELVIS: 1. No evidence of metastatic disease in the abdomen pelvis. 2. Stable benign-appearing sclerotic lesion in the RIGHT sacral ala. 3. Large hiatal hernia. Electronically Signed   By: Genevive Bi M.D.   On: 09/24/2023 12:12     Subjective: Pt reports no complaints, no diarrhea, tolerating diet with no problem.   Discharge Exam: Vitals:   10/14/23 1306 10/14/23 2310  BP: 123/76 108/69  Pulse: 71 64  Resp: 17 18  Temp: 98.2 F (36.8 C) 97.7 F (36.5 C)  SpO2: 97% 96%   Vitals:   10/14/23 0007 10/14/23 0400 10/14/23 1306 10/14/23 2310  BP: 135/75 125/75 123/76 108/69  Pulse: 69 75 71 64  Resp: 19 18 17 18   Temp: 97.6 F (36.4 C) 98.2 F (36.8 C) 98.2 F (36.8 C) 97.7 F (36.5 C)  TempSrc: Oral Oral  Oral  SpO2: 96% 96% 97% 96%  Weight:      Height:       General: Pt is alert, awake,  not in acute distress Cardiovascular: normal S1/S2 +, no rubs, no gallops Respiratory: CTA bilaterally, no wheezing, no rhonchi Abdominal: Soft, NT, ND, bowel sounds + Extremities: no edema, no cyanosis   The results of significant diagnostics from this hospitalization (including imaging, microbiology, ancillary and laboratory) are listed below for reference.     Microbiology: Recent Results (from the past 240 hour(s))  Urine Culture     Status: Abnormal  Collection Time: 10/13/23  2:00 PM   Specimen: Urine, Clean Catch  Result Value Ref Range Status   Specimen Description   Final    URINE, CLEAN CATCH Performed at [facility], [address], [place], [place] [address]    Special Requests   Final    NONE Performed at [facility], [address] [place], [place] [address]    Culture MULTIPLE SPECIES PRESENT, SUGGEST RECOLLECTION (A)  Final   Report Status 10/15/2023 FINAL  Final  SARS Coronavirus 2 by RT PCR (hospital order, performed in [facility] hospital lab) *cepheid single result test* Anterior Nasal Swab     Status: None   Collection Time: 10/13/23  4:24 PM   Specimen: Anterior Nasal Swab  Result Value Ref Range Status   SARS Coronavirus 2 by RT PCR NEGATIVE NEGATIVE Final    Comment: (NOTE) SARS-CoV-2 target nucleic acids are NOT DETECTED.  The SARS-CoV-2 RNA is generally detectable in upper and lower respiratory specimens during the acute phase of infection. The lowest concentration of SARS-CoV-2 viral copies this assay can detect is 250 copies / mL. A negative result does not preclude SARS-CoV-2 infection and should not be used as the sole basis for treatment or other patient management decisions.  A negative result may occur with improper specimen collection / handling, submission of specimen other than nasopharyngeal swab, presence of viral mutation(s) within the areas targeted by this assay, and inadequate number of viral copies (<250 copies / mL). A negative  result must be combined with clinical observations, patient history, and epidemiological information.  Fact Sheet for Patients:   RoadLapTop.co.za  Fact Sheet for Healthcare Providers: http://kim-miller.com/  This test is not yet approved or  cleared by the [place] FDA and has been authorized for detection and/or diagnosis of SARS-CoV-2 by FDA under an Emergency Use Authorization (EUA).  This EUA will remain in effect (meaning this test can be used) for the duration of the COVID-19 declaration under Section 564(b)(1) of the Act, 21 U.S.C. section 360bbb-3(b)(1), unless the authorization is terminated or revoked sooner.  Performed at [facility], [address] [place], [place] [address]      Labs: BNP (last 3 results) No results for input(s): "BNP" in the last 8760 hours. Basic Metabolic Panel: Recent Labs  Lab 10/13/23 1042 10/14/23 0459 10/15/23 0426  NA 136 135 135  K 3.6 4.2 3.8  CL 107 108 111  CO2 19* 18* 20*  GLUCOSE 85 142* 166*  BUN 27* 18 15  CREATININE 1.04* 0.84 0.75  CALCIUM 8.6* 8.2* 8.4*  MG  --   --  2.0   Liver Function Tests: Recent Labs  Lab 10/13/23 1042 10/14/23 0459 10/15/23 0426  AST 351* 213* 115*  ALT 1,314* 992* 676*  ALKPHOS 74 81 70  BILITOT 0.9 0.9 0.7  PROT 5.7* 5.2* 4.5*  ALBUMIN 3.4* 3.0* 2.6*   No results for input(s): "LIPASE", "AMYLASE" in the last 168 hours. No results for input(s): "AMMONIA" in the last 168 hours. CBC: Recent Labs  Lab 10/13/23 1042 10/14/23 0459  WBC 8.6 5.5  NEUTROABS 6.9  --   HGB 14.6 13.8  HCT 43.9 42.0  MCV 90.0 89.7  PLT 87* 76*   Cardiac Enzymes: No results for input(s): "CKTOTAL", "CKMB", "CKMBINDEX", "TROPONINI" in the last 168 hours. BNP: Invalid input(s): "POCBNP" CBG: No results for input(s): "GLUCAP" in the last 168 hours. D-Dimer No results for input(s): "DDIMER" in the last 72 hours. Hgb A1c No results  for input(s):  "HGBA1C" in the last 72 hours. Lipid Profile No results for input(s): "CHOL", "HDL", "LDLCALC", "TRIG", "CHOLHDL", "LDLDIRECT" in the last 72 hours. Thyroid function studies Recent Labs    10/15/23 0426  TSH 2.214   Anemia work up Recent Labs    10/13/23 1045 10/15/23 0426  VITAMINB12 1,484* 970*  FOLATE 10.7 7.5   Urinalysis    Component Value Date/Time   COLORURINE AMBER (A) 10/13/2023 1023   APPEARANCEUR HAZY (A) 10/13/2023 1023   LABSPEC 1.020 10/13/2023 1023   PHURINE 5.0 10/13/2023 1023   GLUCOSEU NEGATIVE 10/13/2023 1023   HGBUR SMALL (A) 10/13/2023 1023   BILIRUBINUR NEGATIVE 10/13/2023 1023   KETONESUR 20 (A) 10/13/2023 1023   PROTEINUR 100 (A) 10/13/2023 1023   UROBILINOGEN 0.2 01/12/2014 1045   NITRITE NEGATIVE 10/13/2023 1023   LEUKOCYTESUR NEGATIVE 10/13/2023 1023   Sepsis Labs Recent Labs  Lab 10/13/23 1042 10/14/23 0459  WBC 8.6 5.5   Microbiology Recent Results (from the past 240 hour(s))  Urine Culture     Status: Abnormal   Collection Time: 10/13/23  2:00 PM   Specimen: Urine, Clean Catch  Result Value Ref Range Status   Specimen Description   Final    URINE, CLEAN CATCH Performed at [facility], [address], [place], [place] [address]    Special Requests   Final    NONE Performed at [facility], [address] [place], [place] [address]    Culture MULTIPLE SPECIES PRESENT, SUGGEST RECOLLECTION (A)  Final   Report Status 10/15/2023 FINAL  Final  SARS Coronavirus 2 by RT PCR (hospital order, performed in [facility] hospital lab) *cepheid single result test* Anterior Nasal Swab     Status: None   Collection Time: 10/13/23  4:24 PM   Specimen: Anterior Nasal Swab  Result Value Ref Range Status   SARS Coronavirus 2 by RT PCR NEGATIVE NEGATIVE Final    Comment: (NOTE) SARS-CoV-2 target nucleic acids are NOT DETECTED.  The SARS-CoV-2 RNA is generally detectable in upper and lower respiratory specimens during the acute phase of  infection. The lowest concentration of SARS-CoV-2 viral copies this assay can detect is 250 copies / mL. A negative result does not preclude SARS-CoV-2 infection and should not be used as the sole basis for treatment or other patient management decisions.  A negative result may occur with improper specimen collection / handling, submission of specimen other than nasopharyngeal swab, presence of viral mutation(s) within the areas targeted by this assay, and inadequate number of viral copies (<250 copies / mL). A negative result must be combined with clinical observations, patient history, and epidemiological information.  Fact Sheet for Patients:   RoadLapTop.co.za  Fact Sheet for Healthcare Providers: http://kim-miller.com/  This test is not yet approved or  cleared by the [place] FDA and has been authorized for detection and/or diagnosis of SARS-CoV-2 by FDA under an Emergency Use Authorization (EUA).  This EUA will remain in effect (meaning this test can be used) for the duration of the COVID-19 declaration under Section 564(b)(1) of the Act, 21 U.S.C. section 360bbb-3(b)(1), unless the authorization is terminated or revoked sooner.  Performed at [facility], [address], [place], [place] [address]    Time coordinating discharge:  48 mins  SIGNED:  Standley Dakins, MD  [facility] 10/15/2023, 12:10 PM How to contact the [facility] Attending or Consulting provider 7A - 7P or covering provider during after hours 7P -7A, for this patient?  Check the care  team in [facility] and look for a) attending/consulting [facility] provider listed and b) the [facility] team listed Log into [email or web address] and use [place]'s universal password to access. If you do not have the password, please contact the hospital operator. Locate the [facility] provider you are looking for under [facility] and page to a number that you can be directly reached. If you still  have difficulty reaching the provider, please page the [facility] (Director on Call) for the Hospitalists listed on amion for assistance.

## 2023-10-16 ENCOUNTER — Telehealth: Payer: Self-pay

## 2023-10-16 NOTE — Transitions of Care (Post Inpatient/ED Visit) (Signed)
   10/16/2023  Name: KLARISA BARMAN MRN: [id number] DOB: 05-13-1946  Today's TOC FU Call Status: Today's TOC FU Call Status:: Unsuccessful Call (1st Attempt) Unsuccessful Call (1st Attempt) Date: 10/16/23  Attempted to reach the patient regarding the most recent Inpatient/ED visit.  Follow Up Plan: Additional outreach attempts will be made to reach the patient to complete the Transitions of Care (Post Inpatient/ED visit) call.   Jodelle Gross RN, BSN, CCM RN Care Manager  Transitions of Care  [facility] - [facility]  [phone]

## 2023-10-16 NOTE — Telephone Encounter (Signed)
Pt was discharged from hospital. Per Dr. Arbutus Ped, pt should be seen 1 week after discharge.  I will send scheduling a message. LVM with pt.

## 2023-10-17 ENCOUNTER — Telehealth: Payer: Self-pay

## 2023-10-17 DIAGNOSIS — C3492 Malignant neoplasm of unspecified part of left bronchus or lung: Secondary | ICD-10-CM | POA: Diagnosis not present

## 2023-10-17 DIAGNOSIS — E039 Hypothyroidism, unspecified: Secondary | ICD-10-CM | POA: Diagnosis not present

## 2023-10-17 DIAGNOSIS — Z8673 Personal history of transient ischemic attack (TIA), and cerebral infarction without residual deficits: Secondary | ICD-10-CM | POA: Diagnosis not present

## 2023-10-17 DIAGNOSIS — F32A Depression, unspecified: Secondary | ICD-10-CM | POA: Diagnosis not present

## 2023-10-17 DIAGNOSIS — I10 Essential (primary) hypertension: Secondary | ICD-10-CM | POA: Diagnosis not present

## 2023-10-17 DIAGNOSIS — Z7952 Long term (current) use of systemic steroids: Secondary | ICD-10-CM | POA: Diagnosis not present

## 2023-10-17 DIAGNOSIS — Z87891 Personal history of nicotine dependence: Secondary | ICD-10-CM | POA: Diagnosis not present

## 2023-10-17 DIAGNOSIS — F419 Anxiety disorder, unspecified: Secondary | ICD-10-CM | POA: Diagnosis not present

## 2023-10-17 DIAGNOSIS — K52832 Lymphocytic colitis: Secondary | ICD-10-CM | POA: Diagnosis not present

## 2023-10-17 DIAGNOSIS — D696 Thrombocytopenia, unspecified: Secondary | ICD-10-CM | POA: Diagnosis not present

## 2023-10-17 DIAGNOSIS — B179 Acute viral hepatitis, unspecified: Secondary | ICD-10-CM | POA: Diagnosis not present

## 2023-10-17 DIAGNOSIS — Z7901 Long term (current) use of anticoagulants: Secondary | ICD-10-CM | POA: Diagnosis not present

## 2023-10-17 NOTE — Transitions of Care (Post Inpatient/ED Visit) (Signed)
   10/17/2023  Name: ANYEA FEIGENBAUM MRN: [id number] DOB: 1946-05-30  Today's TOC FU Call Status: Today's TOC FU Call Status:: Unsuccessful Call (2nd Attempt) Unsuccessful Call (2nd Attempt) Date: 10/17/23  Attempted to reach the patient regarding the most recent Inpatient/ED visit.  Follow Up Plan: Additional outreach attempts will be made to reach the patient to complete the Transitions of Care (Post Inpatient/ED visit) call.   Jodelle Gross RN, BSN, CCM RN Care Manager  Transitions of Care  [facility] - [facility]  [phone]

## 2023-10-20 ENCOUNTER — Ambulatory Visit: Payer: Medicare HMO | Admitting: Internal Medicine

## 2023-10-20 ENCOUNTER — Ambulatory Visit: Payer: Medicare HMO

## 2023-10-20 ENCOUNTER — Other Ambulatory Visit: Payer: Medicare HMO

## 2023-10-21 ENCOUNTER — Telehealth: Payer: Self-pay | Admitting: *Deleted

## 2023-10-21 ENCOUNTER — Inpatient Hospital Stay: Payer: Medicare HMO | Attending: Internal Medicine | Admitting: Dietician

## 2023-10-21 ENCOUNTER — Telehealth: Payer: Self-pay | Admitting: Internal Medicine

## 2023-10-21 DIAGNOSIS — K52832 Lymphocytic colitis: Secondary | ICD-10-CM | POA: Diagnosis not present

## 2023-10-21 DIAGNOSIS — C3492 Malignant neoplasm of unspecified part of left bronchus or lung: Secondary | ICD-10-CM | POA: Diagnosis not present

## 2023-10-21 DIAGNOSIS — F32A Depression, unspecified: Secondary | ICD-10-CM | POA: Diagnosis not present

## 2023-10-21 DIAGNOSIS — Z7901 Long term (current) use of anticoagulants: Secondary | ICD-10-CM | POA: Diagnosis not present

## 2023-10-21 DIAGNOSIS — I10 Essential (primary) hypertension: Secondary | ICD-10-CM | POA: Diagnosis not present

## 2023-10-21 DIAGNOSIS — Z7952 Long term (current) use of systemic steroids: Secondary | ICD-10-CM | POA: Diagnosis not present

## 2023-10-21 DIAGNOSIS — B179 Acute viral hepatitis, unspecified: Secondary | ICD-10-CM | POA: Diagnosis not present

## 2023-10-21 DIAGNOSIS — Z8673 Personal history of transient ischemic attack (TIA), and cerebral infarction without residual deficits: Secondary | ICD-10-CM | POA: Diagnosis not present

## 2023-10-21 DIAGNOSIS — E039 Hypothyroidism, unspecified: Secondary | ICD-10-CM | POA: Diagnosis not present

## 2023-10-21 DIAGNOSIS — Z87891 Personal history of nicotine dependence: Secondary | ICD-10-CM | POA: Diagnosis not present

## 2023-10-21 DIAGNOSIS — D696 Thrombocytopenia, unspecified: Secondary | ICD-10-CM | POA: Diagnosis not present

## 2023-10-21 DIAGNOSIS — F419 Anxiety disorder, unspecified: Secondary | ICD-10-CM | POA: Diagnosis not present

## 2023-10-21 NOTE — Telephone Encounter (Signed)
Source  [name] (Patient)   Subject  Patricia [name] (Patient)   Topic  Clinical - Home Health Verbal Orders    Communication  Caller/Agency: [facility])    Callback Number:  [phone]      Service Requested: Physical Therapy    Frequency: 1 week 1, 2 times for 3 weeks, and once for 3 weeks after    Any new concerns about the patient? No

## 2023-10-21 NOTE — Progress Notes (Signed)
Nutrition Assessment Reached out to patient at home telephone#.  Reason for Assessment: MST screen for weight loss.    ASSESSMENT: Patient and spouse both on phone.  Patient had difficulty speaking and allowed her spouse to relay information.  Patient was recently hospitalized for generalized weakness and decreased oral intake that significantly worsened over the past 2 to 3 days.  Patient recently started adagrasib 600 mg twice daily on 09/26/2023 for her lung cancer. After 3 days of the medication, the patient began experiencing loose stools without hematochezia or melena. She states that she began developing nausea and decreased oral intake. She was discharged on oral prednisone 50 mg daily.  Spouse reported on patients behalf that she was feeling much better.  She is eating couple times a day now.   Eggs, sausage, bacon, chicken livers, pork chops, most meats, corn and peas.  Like fruits, like peanut butter, cheese.   Drinks water and tea doesn't like milk or ONS. Willing to try adding some high protein pudding to increase intake or calcium and vitamin D  Anthropometrics:  weight loss 14.6# (12%) past 2 months which is significant for timeframe.  Height: 57" Weight:  10/13/23  107.7# UBW: 120# BMI: 23.33    NUTRITION DIAGNOSIS: Inadequate PO intake to meet increased nutrient needs, r/t cancer  and treatment   INTERVENTION:  Relayed that nutrition services are wrap around service provided at no charge and encouraged continued communication if experiencing continued weight loss or any nutritional impact symptoms (NIS). Discussed ways to add calories/protein to foods to aid in weight gain. Encouraged small frequent feeds and trying to eat 6 small meals Suggested high protein pudding and calcium and Vit D fortified orange juice to boost intake Attempted to email Nutrition Tip sheet  for  High Protein High Calorie Snacking and Chocolate peanut butter pudding recipe with contact  information provided but got error from email.  Sent tips sheets with contact information to her home address through mail.   MONITORING, EVALUATION, GOAL: weight, PO intake, Nutrition Impact Symptoms, labs Goal is weight 2-4# weight gain per month  Next Visit: PRN at patient or provider request  Gennaro Africa, RDN, LDN Registered Dietitian, [facility] Part Time Remote (Usual office hours: Tuesday-Thursday) Cell: [phone]

## 2023-10-22 ENCOUNTER — Ambulatory Visit: Payer: Medicare HMO | Admitting: Nurse Practitioner

## 2023-10-22 ENCOUNTER — Encounter: Payer: Self-pay | Admitting: Nurse Practitioner

## 2023-10-22 VITALS — BP 98/64 | HR 76 | Temp 96.4°F | Ht <= 58 in | Wt 104.0 lb

## 2023-10-22 DIAGNOSIS — B179 Acute viral hepatitis, unspecified: Secondary | ICD-10-CM

## 2023-10-22 DIAGNOSIS — Z09 Encounter for follow-up examination after completed treatment for conditions other than malignant neoplasm: Secondary | ICD-10-CM

## 2023-10-22 MED ORDER — TOPIRAMATE 100 MG PO TABS: 100.0000 mg | ORAL_TABLET | Freq: Two times a day (BID) | ORAL | 0 refills | Status: DC

## 2023-10-22 NOTE — Telephone Encounter (Signed)
Source  Patricia [name] (Patient)   Subject  Patricia [name] (Patient)   Topic  Clinical - Home Health Verbal Orders    Communication  Caller/Agency: Illene Bolus [facility]    Callback Number: [phone]    Service Requested: Physical Therapy    Frequency: N/A    Any new concerns about the patient? No

## 2023-10-22 NOTE — Progress Notes (Signed)
Subjective:    Patient ID: Patricia [name], female    DOB: 05-05-1946, 77 y.o.   MRN: [id number]  HPI Presents with her husband for follow-up after recent hospitalization.  Patient has been severely weak and is in a wheelchair today.  Receiving physical therapy in her home.  Was hospitalized for acute hepatitis with severely elevated liver enzymes.  Has an appointment with GI specialist on 10/28/2023.  Overall states she feels much better.  Eating at least 2 meals a day.  Appetite improved.  Minimal mild left upper quadrant pain at times, not severe or constant.  No fevers.  No diarrhea or constipation.  No urinary symptoms.  No nausea or vomiting.  No right upper quadrant abdominal pain.  Her main complaint today is continued extreme fatigue.   Review of Systems  Constitutional:  Positive for fatigue. Negative for fever.  Respiratory:  Negative for cough, chest tightness, shortness of breath and wheezing.   Cardiovascular:  Negative for chest pain and leg swelling.  Gastrointestinal:  Positive for abdominal pain. Negative for constipation, diarrhea, nausea and vomiting.       Minimal left upper quadrant pain, intermittent and mild.  Skin:  Negative for color change.      10/22/2023   10:29 AM  Depression screen PHQ 2/9  Decreased Interest 3  Down, Depressed, Hopeless 3  PHQ - 2 Score 6  Altered sleeping 3  Tired, decreased energy 0  Change in appetite 0  Feeling bad or failure about yourself  3  Trouble concentrating 3  Moving slowly or fidgety/restless 3  Suicidal thoughts 0  PHQ-9 Score 18  Difficult doing work/chores Extremely dIfficult      10/22/2023   10:29 AM 07/10/2023   11:39 AM 02/12/2023   12:01 PM 07/05/2022   10:16 AM  GAD 7 : Generalized Anxiety Score  Nervous, Anxious, on Edge 0 2 1 3   Control/stop worrying 0 0 0 3  Worry too much - different things 0 0 0 3  Trouble relaxing 0 0 2 2  Restless 0 0 1 0  Easily annoyed or irritable 0 2 2 3   Afraid - awful  might happen 0 0 0 3  Total GAD 7 Score 0 4 6 17   Anxiety Difficulty Not difficult at all Somewhat difficult Extremely difficult Very difficult         Objective:   Physical Exam NAD.  Alert, oriented.  Cheerful affect.  Speech clear.  Making good eye contact.  Fatigued in appearance.  Husband is present with her today per her request.  Lungs clear.  Heart regular rate rhythm.  No tachypnea.  Skin clear.  Sclera clear.  Abdomen soft nondistended nontender, exam somewhat limited since she is in a wheelchair. Today's Vitals   10/22/23 1025  BP: 98/64  Pulse: 76  Temp: (!) 96.4 F (35.8 C)  SpO2: 97%  Weight: 104 lb (47.2 kg)  Height: 4\' 9"  (1.448 m)   Body mass index is 22.51 kg/m.        Assessment & Plan:  Hospital discharge follow-up Patient defers changes in her medications at this time.  Follow-up with GI specialist next week as planned.  No labs indicated today.  Patient defers flu vaccine.  During the visit her CT scan results of the chest abdomen and pelvis from 09/15/2023 reviewed with patient. Patient has decided to stop care with oncologist at this time.  Defers referral to another provider.  Contact office if she changes her  mind. Continue PT and activity as tolerated.  Also encourage patient continue hydration and regular meals. Meds ordered this encounter  Medications   topiramate (TOPAMAX) 100 MG tablet    Sig: Take 1 tablet (100 mg total) by mouth 2 (two) times daily.    Dispense:  180 tablet    Refill:  0   Refill topiramate as requested.

## 2023-10-23 NOTE — Telephone Encounter (Signed)
Verbal orders were given by Sherie Don to Home Health is morning.

## 2023-10-23 NOTE — Telephone Encounter (Signed)
Copied from CRM [id number]. Topic: Clinical - Home Health Verbal Orders >> Oct 23, 2023  9:11 AM Dollene Primrose wrote: Caller/Agency: [facility] Callback Number: [phone] Service Requested: PT Frequency:  Any new concerns about the patient? No

## 2023-10-23 NOTE — Telephone Encounter (Signed)
May have verbal order for physical therapy

## 2023-10-24 DIAGNOSIS — Z7901 Long term (current) use of anticoagulants: Secondary | ICD-10-CM | POA: Diagnosis not present

## 2023-10-24 DIAGNOSIS — B179 Acute viral hepatitis, unspecified: Secondary | ICD-10-CM | POA: Diagnosis not present

## 2023-10-24 DIAGNOSIS — C3492 Malignant neoplasm of unspecified part of left bronchus or lung: Secondary | ICD-10-CM | POA: Diagnosis not present

## 2023-10-24 DIAGNOSIS — I10 Essential (primary) hypertension: Secondary | ICD-10-CM | POA: Diagnosis not present

## 2023-10-24 DIAGNOSIS — F32A Depression, unspecified: Secondary | ICD-10-CM | POA: Diagnosis not present

## 2023-10-24 DIAGNOSIS — F419 Anxiety disorder, unspecified: Secondary | ICD-10-CM | POA: Diagnosis not present

## 2023-10-24 DIAGNOSIS — K52832 Lymphocytic colitis: Secondary | ICD-10-CM | POA: Diagnosis not present

## 2023-10-24 DIAGNOSIS — E039 Hypothyroidism, unspecified: Secondary | ICD-10-CM | POA: Diagnosis not present

## 2023-10-24 DIAGNOSIS — Z7952 Long term (current) use of systemic steroids: Secondary | ICD-10-CM | POA: Diagnosis not present

## 2023-10-24 DIAGNOSIS — D696 Thrombocytopenia, unspecified: Secondary | ICD-10-CM | POA: Diagnosis not present

## 2023-10-24 DIAGNOSIS — Z87891 Personal history of nicotine dependence: Secondary | ICD-10-CM | POA: Diagnosis not present

## 2023-10-24 DIAGNOSIS — Z8673 Personal history of transient ischemic attack (TIA), and cerebral infarction without residual deficits: Secondary | ICD-10-CM | POA: Diagnosis not present

## 2023-10-28 ENCOUNTER — Ambulatory Visit: Payer: Medicare HMO | Admitting: Internal Medicine

## 2023-10-28 ENCOUNTER — Encounter: Payer: Self-pay | Admitting: Internal Medicine

## 2023-10-28 VITALS — BP 91/62 | HR 80 | Temp 98.2°F | Ht 59.0 in | Wt 102.3 lb

## 2023-10-28 DIAGNOSIS — E039 Hypothyroidism, unspecified: Secondary | ICD-10-CM | POA: Diagnosis not present

## 2023-10-28 DIAGNOSIS — B179 Acute viral hepatitis, unspecified: Secondary | ICD-10-CM | POA: Diagnosis not present

## 2023-10-28 DIAGNOSIS — Z7952 Long term (current) use of systemic steroids: Secondary | ICD-10-CM | POA: Diagnosis not present

## 2023-10-28 DIAGNOSIS — F32A Depression, unspecified: Secondary | ICD-10-CM | POA: Diagnosis not present

## 2023-10-28 DIAGNOSIS — Z87891 Personal history of nicotine dependence: Secondary | ICD-10-CM | POA: Diagnosis not present

## 2023-10-28 DIAGNOSIS — K52832 Lymphocytic colitis: Secondary | ICD-10-CM

## 2023-10-28 DIAGNOSIS — C3492 Malignant neoplasm of unspecified part of left bronchus or lung: Secondary | ICD-10-CM | POA: Diagnosis not present

## 2023-10-28 DIAGNOSIS — D696 Thrombocytopenia, unspecified: Secondary | ICD-10-CM | POA: Diagnosis not present

## 2023-10-28 DIAGNOSIS — K219 Gastro-esophageal reflux disease without esophagitis: Secondary | ICD-10-CM

## 2023-10-28 DIAGNOSIS — Z7901 Long term (current) use of anticoagulants: Secondary | ICD-10-CM | POA: Diagnosis not present

## 2023-10-28 DIAGNOSIS — F419 Anxiety disorder, unspecified: Secondary | ICD-10-CM | POA: Diagnosis not present

## 2023-10-28 DIAGNOSIS — I10 Essential (primary) hypertension: Secondary | ICD-10-CM | POA: Diagnosis not present

## 2023-10-28 DIAGNOSIS — Z8673 Personal history of transient ischemic attack (TIA), and cerebral infarction without residual deficits: Secondary | ICD-10-CM | POA: Diagnosis not present

## 2023-10-28 NOTE — Patient Instructions (Addendum)
It was good to see you again today!  Glad to hear your diarrhea has resolved  Your microscopic colitis is in remission; it is possible that it may flare again in the future  If you have recurrent diarrhea please call me immediately  Continue lansoprazole 30 mg daily 30 minutes before a meal for control of acid reflux  Unless something comes up, plan to see you back in 1 year

## 2023-10-28 NOTE — Progress Notes (Unsigned)
Primary Care Physician:  [name], MD Primary Gastroenterologist:  Dr. Jena Gauss  Pre-Procedure History & Physical: HPI:  Patricia [name] is a 77 y.o. female here for follow-up of nonbloody watery diarrhea secondary to lymphocytic colitis.  She finished a course of Entocort a few weeks ago.  Diarrhea has resolved.  She is very happy.  Formed bowel movement now back to baseline.  GERD well-controlled on lansoprazole 30 mg daily.  No dysphagia.  Past Medical History:  Diagnosis Date   Depression    GERD (gastroesophageal reflux disease)    Hypercholesterolemia    IFG (impaired fasting glucose)    Nonintractable episodic headache 01/18/2023   Osteopenia    PONV (postoperative nausea and vomiting)    Stroke [facility])     Past Surgical History:  Procedure Laterality Date   ABDOMINAL HYSTERECTOMY     BIOPSY  06/24/2023   Procedure: BIOPSY;  Surgeon: Dolores Frame, MD;  Location[facility];  Service: Gastroenterology;;   CATARACT EXTRACTION W/PHACO  09/12/2011   Procedure: CATARACT EXTRACTION PHACO AND INTRAOCULAR LENS PLACEMENT (IOC);  Surgeon: Gemma Payor;  Location: [facility] ORS;  Service: Ophthalmology;  Laterality: Left;  CDE: 8.91   CATARACT EXTRACTION W/PHACO  11/18/2011   Procedure: CATARACT EXTRACTION PHACO AND INTRAOCULAR LENS PLACEMENT (IOC);  Surgeon: Gemma Payor;  Location: [facility] ORS;  Service: Ophthalmology;  Laterality: Right;  CDE:10.26   COLONOSCOPY  2007   Dr. Jena Gauss: internal hemorrhoids, single anal papilla poor prep.    COLONOSCOPY N/A 03/27/2017   Timberlynn Kizziah: Normal exam.   COLONOSCOPY  06/24/2023   Procedure: COLONOSCOPY;  Surgeon: Marguerita Merles, Reuel Boom, MD;  Location: [facility];  Service: Gastroenterology;;   ECTOPIC PREGNANCY SURGERY     removal of right tube   ESOPHAGOGASTRODUODENOSCOPY (EGD) WITH PROPOFOL N/A 09/10/2021   Procedure: ESOPHAGOGASTRODUODENOSCOPY (EGD) WITH PROPOFOL;  Surgeon: Corbin Ade, MD;  Location: [facility];  Service:  Endoscopy;  Laterality: N/A;  8:15AM   MALONEY DILATION N/A 09/10/2021   Procedure: Elease Hashimoto DILATION;  Surgeon: Corbin Ade, MD;  Location: [facility];  Service: Endoscopy;  Laterality: N/A;   OVARIAN CYST REMOVAL     right   RECTOCELE REPAIR N/A 01/18/2014   Procedure: POSTERIOR REPAIR (RECTOCELE);  Surgeon: Tilda Burrow, MD;  Location: [facility];  Service: Gynecology;  Laterality: N/A;   VAGINAL HYSTERECTOMY N/A 01/18/2014   Procedure: HYSTERECTOMY VAGINAL;  Surgeon: Tilda Burrow, MD;  Location: [facility];  Service: Gynecology;  Laterality: N/A;    Prior to Admission medications   Medication Sig Start Date End Date Taking? Authorizing Provider  ALPRAZolam Prudy Feeler) 0.5 MG tablet Take 1 tablet (0.5 mg total) by mouth at bedtime as needed for anxiety or sleep. TAKE (1) TABLET BY MOUTH AT BEDTIME AS NEEDED FOR SLEEP. 09/18/23  Yes Campbell Riches, NP  apixaban (ELIQUIS) 5 MG TABS tablet Take 1 tablet (5 mg total) by mouth 2 (two) times daily. 10/15/23  Yes Johnson, Clanford L, MD  budesonide (ENTOCORT EC) 3 MG 24 hr capsule TAKE 2 CAPSULES (6 MG TOTAL) BY MOUTH DAILY. 10/22/23  Yes Karinda Cabriales, Gerrit Friends, MD  famotidine (PEPCID) 40 MG tablet Take 1 tablet (40 mg total) by mouth daily. 07/10/23  Yes [name], MD  lansoprazole (PREVACID) 30 MG capsule Take 30 mg by mouth daily. 08/06/23  Yes [provider]  levothyroxine (SYNTHROID) 50 MCG tablet TAKE 1 TABLET BY MOUTH DAILY BEFORE BREAKFAST 06/06/23  Yes Si Gaul, MD  ondansetron (ZOFRAN) 8 MG tablet Take 0.5 tablets (4 mg total) by mouth every 8 (eight) hours as needed for nausea or vomiting. 09/29/23  Yes Si Gaul, MD  potassium chloride SA (KLOR-CON M20) 20 MEQ tablet Take 0.5 tablets (10 mEq total) by mouth 2 (two) times daily. 10/15/23  Yes Johnson, Clanford L, MD  predniSONE (DELTASONE) 50 MG tablet Take 1 tablet (50 mg total) by mouth daily with breakfast for 14 days. 10/16/23 10/30/23 Yes Johnson, Clanford L, MD   sertraline (ZOLOFT) 50 MG tablet Take 1 tablet (50 mg total) by mouth daily. 08/20/23  Yes [name], MD  topiramate (TOPAMAX) 100 MG tablet Take 1 tablet (100 mg total) by mouth 2 (two) times daily. 10/22/23  Yes Campbell Riches, NP    Allergies as of 10/28/2023 - Review Complete 10/28/2023  Allergen Reaction Noted   Codeine Nausea Only 09/11/2011    Family History  Problem Relation Age of Onset   Osteoporosis Mother    Heart attack Father    Heart attack Brother 36       MI   Breast cancer Maternal Aunt    Breast cancer Paternal Aunt    Cancer Paternal Aunt        ovarian cancer   Anesthesia problems Neg Hx    Hypotension Neg Hx    Malignant hyperthermia Neg Hx    Pseudochol deficiency Neg Hx    Colon cancer Neg Hx    Inflammatory bowel disease Neg Hx     Social History   Socioeconomic History   Marital status: Married    Spouse name: Not on file   Number of children: Not on file   Years of education: Not on file   Highest education level: Not on file  Occupational History   Not on file  Tobacco Use   Smoking status: Former    Current packs/day: 0.00    Average packs/day: 1 pack/day for 20.0 years (20.0 ttl pk-yrs)    Types: Cigarettes    Start date: 09/10/1964    Quit date: 09/10/1984    Years since quitting: 39.1   Smokeless tobacco: Never  Vaping Use   Vaping status: Never Used  Substance and Sexual Activity   Alcohol use: No   Drug use: No   Sexual activity: Not Currently    Birth control/protection: Post-menopausal  Other Topics Concern   Not on file  Social History Narrative   Not on file   Social Determinants of Health   Financial Resource Strain: Not on file  Food Insecurity: No Food Insecurity (10/13/2023)   Hunger Vital Sign    Worried About Running Out of Food in the Last Year: Never true    Ran Out of Food in the Last Year: Never true  Transportation Needs: No Transportation Needs (10/13/2023)   PRAPARE - Scientist, research (physical sciences) (Medical): No    Lack of Transportation (Non-Medical): No  Physical Activity: Not on file  Stress: Not on file  Social Connections: Unknown (04/20/2022)   Received from [facility]   Social Network    Social Network: Not on file  Intimate Partner Violence: Not At Risk (10/13/2023)   Humiliation, Afraid, Rape, and Kick questionnaire    Fear of Current or Ex-Partner: No    Emotionally Abused: No    Physically Abused: No    Sexually Abused: No    Review of Systems: See HPI, otherwise negative ROS  Physical Exam: BP 91/62 (  BP Location: Left Arm, Patient Position: Sitting, Cuff Size: Normal)   Pulse 80   Temp 98.2 F (36.8 C) (Temporal)   Ht 4\' 11"  (1.499 m)   Wt 102 lb 4.8 oz (46.4 kg)   SpO2 96%   BMI 20.66 kg/m  General:   Alert,  Well-developed, well-nourished, pleasant and cooperative in NAD Lungs:  Clear throughout to auscultation.   No wheezes, crackles, or rhonchi. No acute distress. Heart:  Regular rate and rhythm; no murmurs, clicks, rubs,  or gallops. Abdomen: Non-distended, normal bowel sounds.  Soft and nontender without appreciable mass or hepatosplenomegaly.   Impression/Plan: 77 year old lady with history of nonbloody watery diarrhea secondary to biopsy-proven lymphocytic colitis.  Now in remission after a course of budesonide with taper.  GERD well-controlled on lansoprazole 30 mg daily.  She is very pleased.  I explained to her that her colitis is in remission and not necessarily cured.  She could have a flare in the future. Recommendations:  If you have recurrent diarrhea please call me immediately  Continue lansoprazole 30 mg daily 30 minutes before a meal for control of acid reflux  Unless something comes up, plan to see you back in 1 year   Notice: This dictation was prepared with Dragon dictation along with smaller phrase technology. Any transcriptional errors that result from this process are unintentional and may not be  corrected upon review.

## 2023-10-30 ENCOUNTER — Telehealth: Payer: Self-pay

## 2023-10-30 DIAGNOSIS — I10 Essential (primary) hypertension: Secondary | ICD-10-CM

## 2023-10-30 DIAGNOSIS — Z8673 Personal history of transient ischemic attack (TIA), and cerebral infarction without residual deficits: Secondary | ICD-10-CM

## 2023-10-30 DIAGNOSIS — F32A Depression, unspecified: Secondary | ICD-10-CM

## 2023-10-30 DIAGNOSIS — K52832 Lymphocytic colitis: Secondary | ICD-10-CM

## 2023-10-30 DIAGNOSIS — F419 Anxiety disorder, unspecified: Secondary | ICD-10-CM

## 2023-10-30 DIAGNOSIS — Z87891 Personal history of nicotine dependence: Secondary | ICD-10-CM

## 2023-10-30 DIAGNOSIS — E039 Hypothyroidism, unspecified: Secondary | ICD-10-CM

## 2023-10-30 DIAGNOSIS — B179 Acute viral hepatitis, unspecified: Secondary | ICD-10-CM

## 2023-10-30 DIAGNOSIS — Z7952 Long term (current) use of systemic steroids: Secondary | ICD-10-CM

## 2023-10-30 DIAGNOSIS — Z7901 Long term (current) use of anticoagulants: Secondary | ICD-10-CM

## 2023-10-30 DIAGNOSIS — C3492 Malignant neoplasm of unspecified part of left bronchus or lung: Secondary | ICD-10-CM

## 2023-10-30 DIAGNOSIS — D696 Thrombocytopenia, unspecified: Secondary | ICD-10-CM

## 2023-10-30 NOTE — Telephone Encounter (Signed)
Reason for CRM: vital signs update - BP 90/62  and everything else was also fine. VIA Rosalita Chessman [facility] OT) [phone]

## 2023-10-31 NOTE — Telephone Encounter (Signed)
So noted If she starts having frequent low blood pressures and feeling bad I would recommend a follow-up office visit as quickly as possible thank you

## 2023-11-03 DIAGNOSIS — F32A Depression, unspecified: Secondary | ICD-10-CM | POA: Diagnosis not present

## 2023-11-03 DIAGNOSIS — F419 Anxiety disorder, unspecified: Secondary | ICD-10-CM | POA: Diagnosis not present

## 2023-11-03 DIAGNOSIS — Z7952 Long term (current) use of systemic steroids: Secondary | ICD-10-CM | POA: Diagnosis not present

## 2023-11-03 DIAGNOSIS — I10 Essential (primary) hypertension: Secondary | ICD-10-CM | POA: Diagnosis not present

## 2023-11-03 DIAGNOSIS — Z87891 Personal history of nicotine dependence: Secondary | ICD-10-CM | POA: Diagnosis not present

## 2023-11-03 DIAGNOSIS — B179 Acute viral hepatitis, unspecified: Secondary | ICD-10-CM | POA: Diagnosis not present

## 2023-11-03 DIAGNOSIS — K52832 Lymphocytic colitis: Secondary | ICD-10-CM | POA: Diagnosis not present

## 2023-11-03 DIAGNOSIS — Z8673 Personal history of transient ischemic attack (TIA), and cerebral infarction without residual deficits: Secondary | ICD-10-CM | POA: Diagnosis not present

## 2023-11-03 DIAGNOSIS — E039 Hypothyroidism, unspecified: Secondary | ICD-10-CM | POA: Diagnosis not present

## 2023-11-03 DIAGNOSIS — C3492 Malignant neoplasm of unspecified part of left bronchus or lung: Secondary | ICD-10-CM | POA: Diagnosis not present

## 2023-11-03 DIAGNOSIS — D696 Thrombocytopenia, unspecified: Secondary | ICD-10-CM | POA: Diagnosis not present

## 2023-11-03 DIAGNOSIS — Z7901 Long term (current) use of anticoagulants: Secondary | ICD-10-CM | POA: Diagnosis not present

## 2023-11-03 NOTE — Telephone Encounter (Signed)
Patient notified and verbalized understanding. She stated her blood pressures are doing better.

## 2023-11-04 DIAGNOSIS — Z87891 Personal history of nicotine dependence: Secondary | ICD-10-CM | POA: Diagnosis not present

## 2023-11-04 DIAGNOSIS — Z8673 Personal history of transient ischemic attack (TIA), and cerebral infarction without residual deficits: Secondary | ICD-10-CM | POA: Diagnosis not present

## 2023-11-04 DIAGNOSIS — Z7901 Long term (current) use of anticoagulants: Secondary | ICD-10-CM | POA: Diagnosis not present

## 2023-11-04 DIAGNOSIS — Z7952 Long term (current) use of systemic steroids: Secondary | ICD-10-CM | POA: Diagnosis not present

## 2023-11-04 DIAGNOSIS — E039 Hypothyroidism, unspecified: Secondary | ICD-10-CM | POA: Diagnosis not present

## 2023-11-04 DIAGNOSIS — C3492 Malignant neoplasm of unspecified part of left bronchus or lung: Secondary | ICD-10-CM | POA: Diagnosis not present

## 2023-11-04 DIAGNOSIS — B179 Acute viral hepatitis, unspecified: Secondary | ICD-10-CM | POA: Diagnosis not present

## 2023-11-04 DIAGNOSIS — I10 Essential (primary) hypertension: Secondary | ICD-10-CM | POA: Diagnosis not present

## 2023-11-04 DIAGNOSIS — F419 Anxiety disorder, unspecified: Secondary | ICD-10-CM | POA: Diagnosis not present

## 2023-11-04 DIAGNOSIS — K52832 Lymphocytic colitis: Secondary | ICD-10-CM | POA: Diagnosis not present

## 2023-11-04 DIAGNOSIS — D696 Thrombocytopenia, unspecified: Secondary | ICD-10-CM | POA: Diagnosis not present

## 2023-11-04 DIAGNOSIS — F32A Depression, unspecified: Secondary | ICD-10-CM | POA: Diagnosis not present

## 2023-11-07 ENCOUNTER — Other Ambulatory Visit: Payer: Self-pay | Admitting: Family Medicine

## 2023-11-11 ENCOUNTER — Telehealth: Payer: Self-pay

## 2023-11-11 NOTE — Telephone Encounter (Signed)
Pt was dc'd from the hospital recently and was instructed to stop taking the budesonide. Pt is wanting to know if it is ok for her to go back on it. Please advise.

## 2023-11-11 NOTE — Telephone Encounter (Signed)
Pt's husband was made aware and appt was made

## 2023-11-12 ENCOUNTER — Other Ambulatory Visit: Payer: Self-pay | Admitting: Internal Medicine

## 2023-11-13 ENCOUNTER — Inpatient Hospital Stay (HOSPITAL_COMMUNITY)
Admission: EM | Admit: 2023-11-13 | Discharge: 2023-11-15 | DRG: 372 | Disposition: A | Discharge status: Home Health | Payer: Medicare HMO | Attending: Family Medicine | Admitting: Family Medicine

## 2023-11-13 ENCOUNTER — Ambulatory Visit: Payer: Medicare HMO | Admitting: Nurse Practitioner

## 2023-11-13 ENCOUNTER — Encounter (HOSPITAL_COMMUNITY): Payer: Self-pay

## 2023-11-13 ENCOUNTER — Telehealth: Payer: Self-pay | Admitting: *Deleted

## 2023-11-13 ENCOUNTER — Other Ambulatory Visit: Payer: Self-pay

## 2023-11-13 ENCOUNTER — Emergency Department (HOSPITAL_COMMUNITY): Payer: Medicare HMO

## 2023-11-13 DIAGNOSIS — K52832 Lymphocytic colitis: Secondary | ICD-10-CM | POA: Diagnosis present

## 2023-11-13 DIAGNOSIS — A0472 Enterocolitis due to Clostridium difficile, not specified as recurrent: Principal | ICD-10-CM | POA: Diagnosis present

## 2023-11-13 DIAGNOSIS — Z7952 Long term (current) use of systemic steroids: Secondary | ICD-10-CM | POA: Diagnosis not present

## 2023-11-13 DIAGNOSIS — R1032 Left lower quadrant pain: Secondary | ICD-10-CM | POA: Diagnosis not present

## 2023-11-13 DIAGNOSIS — F419 Anxiety disorder, unspecified: Secondary | ICD-10-CM | POA: Diagnosis not present

## 2023-11-13 DIAGNOSIS — Z79890 Hormone replacement therapy (postmenopausal): Secondary | ICD-10-CM

## 2023-11-13 DIAGNOSIS — K449 Diaphragmatic hernia without obstruction or gangrene: Secondary | ICD-10-CM | POA: Diagnosis not present

## 2023-11-13 DIAGNOSIS — I10 Essential (primary) hypertension: Secondary | ICD-10-CM | POA: Diagnosis present

## 2023-11-13 DIAGNOSIS — E039 Hypothyroidism, unspecified: Secondary | ICD-10-CM | POA: Diagnosis present

## 2023-11-13 DIAGNOSIS — Z8673 Personal history of transient ischemic attack (TIA), and cerebral infarction without residual deficits: Secondary | ICD-10-CM | POA: Diagnosis not present

## 2023-11-13 DIAGNOSIS — M8580 Other specified disorders of bone density and structure, unspecified site: Secondary | ICD-10-CM | POA: Diagnosis present

## 2023-11-13 DIAGNOSIS — K219 Gastro-esophageal reflux disease without esophagitis: Secondary | ICD-10-CM | POA: Diagnosis present

## 2023-11-13 DIAGNOSIS — Z7901 Long term (current) use of anticoagulants: Secondary | ICD-10-CM | POA: Diagnosis not present

## 2023-11-13 DIAGNOSIS — K5100 Ulcerative (chronic) pancolitis without complications: Secondary | ICD-10-CM | POA: Diagnosis not present

## 2023-11-13 DIAGNOSIS — F32A Depression, unspecified: Secondary | ICD-10-CM | POA: Diagnosis not present

## 2023-11-13 DIAGNOSIS — R197 Diarrhea, unspecified: Secondary | ICD-10-CM | POA: Diagnosis not present

## 2023-11-13 DIAGNOSIS — C3492 Malignant neoplasm of unspecified part of left bronchus or lung: Secondary | ICD-10-CM | POA: Diagnosis not present

## 2023-11-13 DIAGNOSIS — R54 Age-related physical debility: Secondary | ICD-10-CM | POA: Diagnosis present

## 2023-11-13 DIAGNOSIS — Z885 Allergy status to narcotic agent status: Secondary | ICD-10-CM

## 2023-11-13 DIAGNOSIS — Z8262 Family history of osteoporosis: Secondary | ICD-10-CM

## 2023-11-13 DIAGNOSIS — Z5971 Insufficient health insurance coverage: Secondary | ICD-10-CM

## 2023-11-13 DIAGNOSIS — Z87891 Personal history of nicotine dependence: Secondary | ICD-10-CM | POA: Diagnosis not present

## 2023-11-13 DIAGNOSIS — E876 Hypokalemia: Secondary | ICD-10-CM | POA: Diagnosis present

## 2023-11-13 DIAGNOSIS — D696 Thrombocytopenia, unspecified: Secondary | ICD-10-CM | POA: Diagnosis not present

## 2023-11-13 DIAGNOSIS — Z90710 Acquired absence of both cervix and uterus: Secondary | ICD-10-CM

## 2023-11-13 DIAGNOSIS — R531 Weakness: Secondary | ICD-10-CM | POA: Diagnosis not present

## 2023-11-13 DIAGNOSIS — Z66 Do not resuscitate: Secondary | ICD-10-CM | POA: Diagnosis present

## 2023-11-13 DIAGNOSIS — Z8041 Family history of malignant neoplasm of ovary: Secondary | ICD-10-CM

## 2023-11-13 DIAGNOSIS — R1030 Lower abdominal pain, unspecified: Secondary | ICD-10-CM

## 2023-11-13 DIAGNOSIS — B179 Acute viral hepatitis, unspecified: Secondary | ICD-10-CM | POA: Diagnosis not present

## 2023-11-13 DIAGNOSIS — F418 Other specified anxiety disorders: Secondary | ICD-10-CM | POA: Diagnosis present

## 2023-11-13 DIAGNOSIS — I639 Cerebral infarction, unspecified: Secondary | ICD-10-CM | POA: Diagnosis present

## 2023-11-13 DIAGNOSIS — Z79899 Other long term (current) drug therapy: Secondary | ICD-10-CM

## 2023-11-13 DIAGNOSIS — Z803 Family history of malignant neoplasm of breast: Secondary | ICD-10-CM

## 2023-11-13 DIAGNOSIS — E7800 Pure hypercholesterolemia, unspecified: Secondary | ICD-10-CM | POA: Diagnosis present

## 2023-11-13 DIAGNOSIS — Z8249 Family history of ischemic heart disease and other diseases of the circulatory system: Secondary | ICD-10-CM

## 2023-11-13 DIAGNOSIS — R627 Adult failure to thrive: Secondary | ICD-10-CM | POA: Diagnosis present

## 2023-11-13 DIAGNOSIS — I82403 Acute embolism and thrombosis of unspecified deep veins of lower extremity, bilateral: Secondary | ICD-10-CM | POA: Diagnosis present

## 2023-11-13 LAB — COMPREHENSIVE METABOLIC PANEL
ALT: 24 U/L (ref 0–44)
AST: 19 U/L (ref 15–41)
Albumin: 3.3 g/dL — ABNORMAL LOW (ref 3.5–5.0)
Alkaline Phosphatase: 107 U/L (ref 38–126)
Anion gap: 10 (ref 5–15)
BUN: 22 mg/dL (ref 8–23)
CO2: 19 mmol/L — ABNORMAL LOW (ref 22–32)
Calcium: 9.6 mg/dL (ref 8.9–10.3)
Chloride: 110 mmol/L (ref 98–111)
Creatinine, Ser: 0.76 mg/dL (ref 0.44–1.00)
GFR, Estimated: >60 mL/min (ref >60)
Glucose, Bld: 100 mg/dL — ABNORMAL HIGH (ref 70–99)
Potassium: 3.4 mmol/L — ABNORMAL LOW (ref 3.5–5.1)
Sodium: 139 mmol/L (ref 135–145)
Total Bilirubin: 0.9 mg/dL (ref <1.2)
Total Protein: 6 g/dL — ABNORMAL LOW (ref 6.5–8.1)

## 2023-11-13 LAB — CBC WITH DIFFERENTIAL/PLATELET
Abs Immature Granulocytes: 0.05 10*3/uL (ref 0.00–0.07)
Basophils Absolute: 0.1 10*3/uL (ref 0.0–0.1)
Basophils Relative: 1 %
Eosinophils Absolute: 0.1 10*3/uL (ref 0.0–0.5)
Eosinophils Relative: 2 %
HCT: 43.7 % (ref 36.0–46.0)
Hemoglobin: 14 g/dL (ref 12.0–15.0)
Immature Granulocytes: 1 %
Lymphocytes Relative: 42 %
Lymphs Abs: 2 10*3/uL (ref 0.7–4.0)
MCH: 29.4 pg (ref 26.0–34.0)
MCHC: 32 g/dL (ref 30.0–36.0)
MCV: 91.8 fL (ref 80.0–100.0)
Monocytes Absolute: 0.7 10*3/uL (ref 0.1–1.0)
Monocytes Relative: 15 %
Neutro Abs: 1.9 10*3/uL (ref 1.7–7.7)
Neutrophils Relative %: 39 %
Platelets: 240 10*3/uL (ref 150–400)
RBC: 4.76 MIL/uL (ref 3.87–5.11)
RDW: 16 % — ABNORMAL HIGH (ref 11.5–15.5)
WBC: 4.8 10*3/uL (ref 4.0–10.5)
nRBC: 0 % (ref 0.0–0.2)

## 2023-11-13 LAB — URINALYSIS, ROUTINE W REFLEX MICROSCOPIC
Bilirubin Urine: NEGATIVE
Glucose, UA: NEGATIVE mg/dL
Hgb urine dipstick: NEGATIVE
Ketones, ur: 5 mg/dL — AB
Leukocytes,Ua: NEGATIVE
Nitrite: NEGATIVE
Protein, ur: NEGATIVE mg/dL
Specific Gravity, Urine: >1.046 — ABNORMAL HIGH (ref 1.005–1.030)
pH: 5 (ref 5.0–8.0)

## 2023-11-13 LAB — C DIFFICILE QUICK SCREEN W PCR REFLEX
C Diff antigen: POSITIVE — AB
C Diff toxin: NEGATIVE

## 2023-11-13 LAB — MAGNESIUM: Magnesium: 2 mg/dL (ref 1.7–2.4)

## 2023-11-13 LAB — LIPASE, BLOOD: Lipase: 22 U/L (ref 11–51)

## 2023-11-13 MED ORDER — LACTATED RINGERS IV SOLN: INTRAVENOUS | Status: AC

## 2023-11-13 MED ORDER — POTASSIUM CHLORIDE CRYS ER 20 MEQ PO TBCR
40.0000 meq | EXTENDED_RELEASE_TABLET | Freq: Once | ORAL | Status: AC
  Administered 2023-11-13: 40 meq via ORAL
  Filled 2023-11-13: qty 2

## 2023-11-13 MED ORDER — ONDANSETRON HCL 4 MG/2ML IJ SOLN
4.0000 mg | Freq: Once | INTRAMUSCULAR | Status: AC
  Administered 2023-11-13: 4 mg via INTRAVENOUS
  Filled 2023-11-13: qty 2

## 2023-11-13 MED ORDER — ACETAMINOPHEN 325 MG PO TABS: 650.0000 mg | ORAL_TABLET | Freq: Four times a day (QID) | ORAL | Status: DC | PRN

## 2023-11-13 MED ORDER — SODIUM CHLORIDE 0.9 % IV SOLN
2.0000 g | Freq: Once | INTRAVENOUS | Status: AC
  Administered 2023-11-13: 2 g via INTRAVENOUS
  Filled 2023-11-13: qty 20

## 2023-11-13 MED ORDER — IOHEXOL 350 MG/ML SOLN
80.0000 mL | Freq: Once | INTRAVENOUS | Status: AC | PRN
  Administered 2023-11-13: 80 mL via INTRAVENOUS

## 2023-11-13 MED ORDER — ALPRAZOLAM 0.5 MG PO TABS: 0.5000 mg | ORAL_TABLET | Freq: Every evening | ORAL | Status: DC | PRN

## 2023-11-13 MED ORDER — PANTOPRAZOLE SODIUM 40 MG PO TBEC
40.0000 mg | DELAYED_RELEASE_TABLET | Freq: Every day | ORAL | Status: DC
  Administered 2023-11-13 – 2023-11-15 (×3): 40 mg via ORAL
  Filled 2023-11-13 (×3): qty 1

## 2023-11-13 MED ORDER — SERTRALINE HCL 50 MG PO TABS
25.0000 mg | ORAL_TABLET | Freq: Every day | ORAL | Status: DC
  Administered 2023-11-13 – 2023-11-15 (×3): 25 mg via ORAL
  Filled 2023-11-13 (×3): qty 1

## 2023-11-13 MED ORDER — TOPIRAMATE 25 MG PO TABS
50.0000 mg | ORAL_TABLET | Freq: Two times a day (BID) | ORAL | Status: DC
  Administered 2023-11-13 – 2023-11-15 (×4): 50 mg via ORAL
  Filled 2023-11-13 (×4): qty 2

## 2023-11-13 MED ORDER — METRONIDAZOLE 500 MG/100ML IV SOLN
500.0000 mg | Freq: Once | INTRAVENOUS | Status: AC
  Administered 2023-11-13: 500 mg via INTRAVENOUS
  Filled 2023-11-13: qty 100

## 2023-11-13 MED ORDER — APIXABAN 5 MG PO TABS
5.0000 mg | ORAL_TABLET | Freq: Two times a day (BID) | ORAL | Status: DC
  Administered 2023-11-13 – 2023-11-15 (×4): 5 mg via ORAL
  Filled 2023-11-13 (×4): qty 1

## 2023-11-13 MED ORDER — SODIUM CHLORIDE 0.9 % IV SOLN: 2.0000 g | INTRAVENOUS | Status: DC

## 2023-11-13 MED ORDER — SODIUM CHLORIDE 0.9 % IV BOLUS
1000.0000 mL | Freq: Once | INTRAVENOUS | Status: AC
  Administered 2023-11-13: 1000 mL via INTRAVENOUS

## 2023-11-13 MED ORDER — METRONIDAZOLE 500 MG/100ML IV SOLN
500.0000 mg | Freq: Two times a day (BID) | INTRAVENOUS | Status: DC
  Administered 2023-11-14: 500 mg via INTRAVENOUS
  Filled 2023-11-13: qty 100

## 2023-11-13 MED ORDER — LEVOTHYROXINE SODIUM 50 MCG PO TABS
50.0000 ug | ORAL_TABLET | Freq: Every day | ORAL | Status: DC
  Administered 2023-11-14 – 2023-11-15 (×2): 50 ug via ORAL
  Filled 2023-11-13 (×2): qty 1

## 2023-11-13 MED ORDER — POTASSIUM CHLORIDE CRYS ER 20 MEQ PO TBCR
20.0000 meq | EXTENDED_RELEASE_TABLET | Freq: Three times a day (TID) | ORAL | Status: DC
  Administered 2023-11-14 – 2023-11-15 (×4): 20 meq via ORAL
  Filled 2023-11-13 (×4): qty 1

## 2023-11-13 MED ORDER — ACETAMINOPHEN 650 MG RE SUPP: 650.0000 mg | Freq: Four times a day (QID) | RECTAL | Status: DC | PRN

## 2023-11-13 NOTE — ED Provider Notes (Signed)
   Patient signed out to me by Claudette Stapler, PA-C pending completion of workup and CT abdomen and pelvis   Patient with history of lung cancer, prior stroke, anticoagulated on apixaban here with complaint of diarrhea and lower abdominal pain.  Diarrhea present for 1 week.  Nonbloody or black stool She was sent here by PCP for possible dehydration she has had decreased p.o. intake.  No recent antibiotics.  Previous GI notes state patient has lymphocytic colitis  See previous provider note for complete H&P  Workup without evidence of leukocytosis, her hemoglobin is unremarkable.  Magnesium lipase and chemistries without significant derangement.  Patient receiving IV fluids and antiemetic  CT of the abdomen and pelvis shows pancolitis and diarrheal state.  C. difficile and GI panel were ordered.  IV ceftriaxone and metronidazole ordered.  Will consult hospitalist for admission.  Discussed with [facility] hospitalist who agrees to admit   Patricia Aus, PA-C 11/13/23 2137    Terrilee Files, MD 11/14/23 1012

## 2023-11-13 NOTE — ED Notes (Signed)
MSE completed Pt moved back to lobby

## 2023-11-13 NOTE — ED Provider Triage Note (Signed)
Emergency Medicine Provider Triage Evaluation Note  Patricia [name] , a 77 y.o. female  was evaluated in triage.  Pt complains of generalized weakness and diarrhea x1 week.  No nausea or vomiting.  Patient was sent by PCP due to concerns about dehydration.  No fever or chills.  Review of Systems  Positive: Diarrhea, weakness Negative: fever  Physical Exam  BP 104/77 (BP Location: Right Arm)   Pulse 93   Temp 99.9 F (37.7 C) (Oral)   Resp 18   Ht 4\' 8"  (1.422 m)   Wt 45.4 kg   SpO2 93%   BMI 22.42 kg/m  Gen:   Awake, no distress   Resp:  Normal effort  MSK:   Moves extremities without difficulty  Other:    Medical Decision Making  Medically screening exam initiated at 3:19 PM.  Appropriate orders placed.  HUDSYN TRAW was informed that the remainder of the evaluation will be completed by another provider, this initial triage assessment does not replace that evaluation, and the importance of remaining in the ED until their evaluation is complete.  labs   Mannie Stabile, [place] 11/13/23 1520

## 2023-11-13 NOTE — Telephone Encounter (Signed)
Copied from CRM [id number]. Topic: Clinical - Medical Advice >> Nov 13, 2023  9:41 AM Theodis Sato wrote: Reason for CRM: Rosalita Chessman from [place] home [facility] need this message to seen by nurse or Dr.Scott- PT has a vital sign alert BP was 98/58 all other vitals were fine ...ongoing diarrhea since thanksgiving unable to keep food down and has nausea - Rosalita Chessman is concerned about dehydration Call [phone]

## 2023-11-13 NOTE — ED Triage Notes (Signed)
Sent from PCP for dehydration Pt complains of diarrhea and weakness since Thanksgiving. Ongoing nausea, denies vomiting

## 2023-11-13 NOTE — Telephone Encounter (Signed)
Patient triage and very weak with hypotension and the home health nurse feels like she dehydrated. Advised patient to go the ER for evaluation.

## 2023-11-13 NOTE — ED Provider Notes (Signed)
[place] EMERGENCY DEPARTMENT AT [facility] Provider Note   CSN: [id number] Arrival date & time: 11/13/23  1120     History  Chief Complaint  Patient presents with   Weakness    Patricia [name] is a 77 y.o. female with a past medical history significant for history of CVA, GERD, hyperlipidemia, depression, and history of non-small lung cancer who presents to the ED due to diarrhea x 1 week.  Patient notes roughly 3 episodes of diarrhea daily for the past week.  Diarrhea is nonbloody.  Also admits to lower abdominal pain and generalized weakness.  She is also having some nausea however, no vomiting.  No fever or chills.  Patient was sent by PCP due to concerns about dehydration.  Patient notes she has been unable to tolerate p.o. given it causes instant diarrhea.  No history of diverticulitis.  No recent antibiotics.  History obtained from patient and past medical records. No interpreter used during encounter.       Home Medications Prior to Admission medications   Medication Sig Start Date End Date Taking? Authorizing Provider  ALPRAZolam Prudy Feeler) 0.5 MG tablet Take 1 tablet (0.5 mg total) by mouth at bedtime as needed for anxiety or sleep. TAKE (1) TABLET BY MOUTH AT BEDTIME AS NEEDED FOR SLEEP. 09/18/23   Campbell Riches, NP  apixaban (ELIQUIS) 5 MG TABS tablet Take 1 tablet (5 mg total) by mouth 2 (two) times daily. 10/15/23   Johnson, Clanford L, MD  budesonide (ENTOCORT EC) 3 MG 24 hr capsule TAKE 2 CAPSULES (6 MG TOTAL) BY MOUTH DAILY. 10/22/23   Rourk, Gerrit Friends, MD  famotidine (PEPCID) 40 MG tablet Take 1 tablet (40 mg total) by mouth daily. 07/10/23   [name], MD  KLOR-CON M20 20 MEQ tablet TAKE 1 TABLET BY MOUTH THREE TIMES A DAY 11/10/23   [name], MD  lansoprazole (PREVACID) 30 MG capsule Take 30 mg by mouth daily. 08/06/23   [provider]  levothyroxine (SYNTHROID) 50 MCG tablet TAKE 1 TABLET BY MOUTH DAILY BEFORE BREAKFAST 06/06/23    Si Gaul, MD  ondansetron (ZOFRAN) 8 MG tablet Take 0.5 tablets (4 mg total) by mouth every 8 (eight) hours as needed for nausea or vomiting. 09/29/23   Si Gaul, MD  sertraline (ZOLOFT) 50 MG tablet Take 1 tablet (50 mg total) by mouth daily. 08/20/23   [name], MD  topiramate (TOPAMAX) 100 MG tablet Take 1 tablet (100 mg total) by mouth 2 (two) times daily. 10/22/23   Campbell Riches, NP  topiramate (TOPAMAX) 50 MG tablet TAKE 1 TABLET BY MOUTH TWICE A DAY 11/12/23   Vaslow, Georgeanna Lea, MD      Allergies    Codeine    Review of Systems   Review of Systems  Constitutional:  Negative for fever.  Respiratory:  Negative for shortness of breath.   Cardiovascular:  Negative for chest pain.  Gastrointestinal:  Positive for abdominal pain, diarrhea and nausea. Negative for vomiting.  Genitourinary:  Negative for dysuria.    Physical Exam Updated Vital Signs BP 125/78   Pulse 76   Temp 97.8 F (36.6 C) (Axillary)   Resp 18   Ht 4\' 8"  (1.422 m)   Wt 45.4 kg   SpO2 96%   BMI 22.42 kg/m  Physical Exam Vitals and nursing note reviewed.  Constitutional:      General: She is not in acute distress.    Appearance: She is  not ill-appearing.  HENT:     Head: Normocephalic.  Eyes:     Pupils: Pupils are equal, round, and reactive to light.  Cardiovascular:     Rate and Rhythm: Normal rate and regular rhythm.     Pulses: Normal pulses.     Heart sounds: Normal heart sounds. No murmur heard.    No friction rub. No gallop.  Pulmonary:     Effort: Pulmonary effort is normal.     Breath sounds: Normal breath sounds.  Abdominal:     General: Abdomen is flat. There is no distension.     Palpations: Abdomen is soft.     Tenderness: There is abdominal tenderness. There is no guarding or rebound.     Comments: Mild lower abdominal tenderness  Musculoskeletal:        General: Normal range of motion.     Cervical back: Neck supple.  Skin:    General: Skin is warm  and dry.  Neurological:     General: No focal deficit present.     Mental Status: She is alert.  Psychiatric:        Mood and Affect: Mood normal.        Behavior: Behavior normal.     ED Results / Procedures / Treatments   Labs (all labs ordered are listed, but only abnormal results are displayed) Labs Reviewed  CBC WITH DIFFERENTIAL/PLATELET - Abnormal; Notable for the following components:      Result Value   RDW 16.0 (*)    All other components within normal limits  COMPREHENSIVE METABOLIC PANEL - Abnormal; Notable for the following components:   Potassium 3.4 (*)    CO2 19 (*)    Glucose, Bld 100 (*)    Total Protein 6.0 (*)    Albumin 3.3 (*)    All other components within normal limits  GASTROINTESTINAL PANEL BY PCR, STOOL (REPLACES STOOL CULTURE)  C DIFFICILE QUICK SCREEN W PCR REFLEX    MAGNESIUM  LIPASE, BLOOD  URINALYSIS, ROUTINE W REFLEX MICROSCOPIC    EKG EKG Interpretation Date/Time:  Thursday November 13 2023 11:44:10 EST Ventricular Rate:  93 PR Interval:  126 QRS Duration:  70 QT Interval:  346 QTC Calculation: 430 R Axis:   27  Text Interpretation: Normal sinus rhythm Cannot rule out Inferior infarct , age undetermined Cannot rule out Anterior infarct , age undetermined Abnormal ECG When compared with ECG of 13-Oct-2023 10:24, No significant change since last tracing Confirmed by Meridee Score [phone]) on 11/13/2023 2:55:35 PM  Radiology No results found.  Procedures Procedures    Medications Ordered in ED Medications  sodium chloride 0.9 % bolus 1,000 mL (has no administration in time range)  ondansetron (ZOFRAN) injection 4 mg (has no administration in time range)    ED Course/ Medical Decision Making/ A&P                                 Medical Decision Making Amount and/or Complexity of Data Reviewed Independent Historian: caregiver External Data Reviewed: notes.    Details: GI note Labs: ordered. Decision-making details  documented in ED Course. Radiology: ordered. ECG/medicine tests: ordered and independent interpretation performed. Decision-making details documented in ED Course.  Risk Prescription drug management.   This patient presents to the ED for concern of abdominal pain/diarrhea, this involves an extensive number of treatment options, and is a complaint that carries with it a high risk of  complications and morbidity.  The differential diagnosis includes diverticulitis, gastroenteritis, pancreatitis, etc  77 year old female presents to the ED due to abdominal pain associated with diarrhea x 1 week.  Also endorses generalized weakness.  Sent by PCP due to concerns about dehydration.  Upon arrival, patient afebrile, not tachycardic or hypoxic.  Patient in no acute distress.  Abdomen soft, nondistended with very mild lower abdominal tenderness.  Routine labs ordered.  IV fluids given.  CT abdomen.  Zofran given.  CBC reassuring.  No leukocytosis.  Normal hemoglobin.  CMP with mild hypokalemia 3.4.  Normal renal function.  Normal LFTs.  Magnesium normal.  Patient handed off to [employer], PA-C at shift change pending CT abdomen, UA, and reassessment  Co morbidities that complicate the patient evaluation  Lung cancer Cardiac Monitoring: / EKG:  The patient was maintained on a cardiac monitor.  I personally viewed and interpreted the cardiac monitored which showed an underlying rhythm of: NSR  Social Determinants of Health:  Elderly >65       Final Clinical Impression(s) / ED Diagnoses Final diagnoses:  Generalized weakness  Lower abdominal pain  Diarrhea, unspecified type    Rx / DC Orders ED Discharge Orders     None         Jesusita Oka 11/13/23 1905    Terrilee Files, MD 11/14/23 1012

## 2023-11-13 NOTE — ED Notes (Signed)
ED TO INPATIENT HANDOFF REPORT  ED Nurse Name and Phone #: Joneen Roach RN   S Name/Age/Gender Patricia [name] 77 y.o. female Room/Bed: APA10/APA10  Code Status   Code Status: Limited: Do not attempt resuscitation (DNR) -DNR-LIMITED -Do Not Intubate/DNI   Home/SNF/Other Home Patient oriented to: self, place, time, and situation Is this baseline? Yes   Triage Complete: Triage complete  Chief Complaint Pancolitis [facility]) [K51.00]  Triage Note Sent from PCP for dehydration Pt complains of diarrhea and weakness since Thanksgiving. Ongoing nausea, denies vomiting    Allergies Allergies  Allergen Reactions   Codeine Nausea Only    Level of Care/Admitting Diagnosis ED Disposition     ED Disposition  Admit   Condition  --   Comment  Hospital Area: [facility] [[id number]]  Level of Care: Med-Surg [16]  Covid Evaluation: Asymptomatic - no recent exposure (last 10 days) testing not required  Diagnosis: Pancolitis [facility]) [[id number]]  Admitting Physician: Chiquita Loth  Attending Physician: Randol Kern, DAWOOD S [[id number]]          B Medical/Surgery History Past Medical History:  Diagnosis Date   Depression    GERD (gastroesophageal reflux disease)    Hypercholesterolemia    IFG (impaired fasting glucose)    Nonintractable episodic headache 01/18/2023   Osteopenia    PONV (postoperative nausea and vomiting)    Stroke [facility])    Past Surgical History:  Procedure Laterality Date   ABDOMINAL HYSTERECTOMY     BIOPSY  06/24/2023   Procedure: BIOPSY;  Surgeon: Dolores Frame, MD;  Location: [facility] ENDO SUITE;  Service: Gastroenterology;;   CATARACT EXTRACTION W/PHACO  09/12/2011   Procedure: CATARACT EXTRACTION PHACO AND INTRAOCULAR LENS PLACEMENT (IOC);  Surgeon: Gemma Payor;  Location: [facility] ORS;  Service: Ophthalmology;  Laterality: Left;  CDE: 8.91   CATARACT EXTRACTION W/PHACO  11/18/2011   Procedure: CATARACT EXTRACTION PHACO AND INTRAOCULAR LENS  PLACEMENT (IOC);  Surgeon: Gemma Payor;  Location: [facility] ORS;  Service: Ophthalmology;  Laterality: Right;  CDE:10.26   COLONOSCOPY  2007   Dr. Jena Gauss: internal hemorrhoids, single anal papilla poor prep.    COLONOSCOPY N/A 03/27/2017   Rourk: Normal exam.   COLONOSCOPY  06/24/2023   Procedure: COLONOSCOPY;  Surgeon: Marguerita Merles, Reuel Boom, MD;  Location: [facility] ENDO SUITE;  Service: Gastroenterology;;   ECTOPIC PREGNANCY SURGERY     removal of right tube   ESOPHAGOGASTRODUODENOSCOPY (EGD) WITH PROPOFOL N/A 09/10/2021   Procedure: ESOPHAGOGASTRODUODENOSCOPY (EGD) WITH PROPOFOL;  Surgeon: Corbin Ade, MD;  Location: [facility] END[facility] SU[facility];  Service: Endoscopy;  Laterality: N/A;  8:15AM   MALONEY DILATION N/A 09/10/2021   Procedure: Elease Hashimoto DILATION;  Surgeon: Corbin Ade, MD;  Location: [facility] END[facility] SU[facility];  Service: Endoscopy;  Laterality: N/A;   OVARIAN CYST REMOVAL     right   RECTOCELE REPAIR N/A 01/18/2014   Procedure: POSTERIOR REPAIR (RECTOCELE);  Surgeon: Tilda Burrow, MD;  Location: [facility] ORS;  Service: Gynecology;  Laterality: N/A;   VAGINAL HYSTERECTOMY N/A 01/18/2014   Procedure: HYSTERECTOMY VAGINAL;  Surgeon: Tilda Burrow, MD;  Location: [facility] ORS;  Service: Gynecology;  Laterality: N/A;     A IV Location/Drains/Wounds Patient Lines/Drains/Airways Status     Active Line/Drains/Airways     Name Placement date Placement time Site Days   Peripheral IV 11/13/23 22 G Anterior;Right Forearm 11/13/23  1920  Forearm  less than 1            Intake/Output Last 24 hours  Intake/Output Summary (  Last 24 hours) at 11/13/2023 2248 Last data filed at 11/13/2023 2210 Gross per 24 hour  Intake 99.58 ml  Output --  Net 99.58 ml    Labs/Imaging Results for orders placed or performed during the hospital encounter of 11/13/23 (from the past 48 hour(s))  CBC with Differential     Status: Abnormal   Collection Time: 11/13/23 11:52 AM  Result Value Ref Range   WBC 4.8 4.0 - 10.5 K/uL   RBC 4.76  3.87 - 5.11 MIL/uL   Hemoglobin 14.0 12.0 - 15.0 g/dL   HCT 16.1 09.6 - 04.5 %   MCV 91.8 80.0 - 100.0 fL   MCH 29.4 26.0 - 34.0 pg   MCHC 32.0 30.0 - 36.0 g/dL   RDW 40.9 (H) 81.1 - 91.4 %   Platelets 240 150 - 400 K/uL   nRBC 0.0 0.0 - 0.2 %   Neutrophils Relative % 39 %   Neutro Abs 1.9 1.7 - 7.7 K/uL   Lymphocytes Relative 42 %   Lymphs Abs 2.0 0.7 - 4.0 K/uL   Monocytes Relative 15 %   Monocytes Absolute 0.7 0.1 - 1.0 K/uL   Eosinophils Relative 2 %   Eosinophils Absolute 0.1 0.0 - 0.5 K/uL   Basophils Relative 1 %   Basophils Absolute 0.1 0.0 - 0.1 K/uL   Immature Granulocytes 1 %   Abs Immature Granulocytes 0.05 0.00 - 0.07 K/uL    Comment: Performed at [facility], [address], [place], [place] [address]  Comprehensive metabolic panel     Status: Abnormal   Collection Time: 11/13/23 11:52 AM  Result Value Ref Range   Sodium 139 135 - 145 mmol/L   Potassium 3.4 (L) 3.5 - 5.1 mmol/L   Chloride 110 98 - 111 mmol/L   CO2 19 (L) 22 - 32 mmol/L   Glucose, Bld 100 (H) 70 - 99 mg/dL    Comment: Glucose reference range applies only to samples taken after fasting for at least 8 hours.   BUN 22 8 - 23 mg/dL   Creatinine, Ser 6.21 0.44 - 1.00 mg/dL   Calcium 9.6 8.9 - 30.8 mg/dL   Total Protein 6.0 (L) 6.5 - 8.1 g/dL   Albumin 3.3 (L) 3.5 - 5.0 g/dL   AST 19 15 - 41 U/L   ALT 24 0 - 44 U/L   Alkaline Phosphatase 107 38 - 126 U/L   Total Bilirubin 0.9 <1.2 mg/dL   GFR, Estimated >65 >78 mL/min    Comment: (NOTE) Calculated using the CKD-EPI Creatinine Equation (2021)    Anion gap 10 5 - 15    Comment: Performed at [facility], [address] [place], [place] [address]  Magnesium     Status: None   Collection Time: 11/13/23 11:52 AM  Result Value Ref Range   Magnesium 2.0 1.7 - 2.4 mg/dL    Comment: Performed at [facility], [address] [place], [place] [address]  Lipase, blood     Status: None   Collection Time: 11/13/23 11:52 AM  Result Value Ref Range    Lipase 22 11 - 51 U/L    Comment: Performed at [facility], [address] [place], [place] [address]  Urinalysis, Routine w reflex microscopic -Urine, Clean Catch     Status: Abnormal   Collection Time: 11/13/23  8:48 PM  Result Value Ref Range   Color, Urine YELLOW YELLOW   APPearance CLEAR CLEAR   Specific Gravity, Urine >1.046 (H) 1.005 - 1.030  pH 5.0 5.0 - 8.0   Glucose, UA NEGATIVE NEGATIVE mg/dL   Hgb urine dipstick NEGATIVE NEGATIVE   Bilirubin Urine NEGATIVE NEGATIVE   Ketones, ur 5 (A) NEGATIVE mg/dL   Protein, ur NEGATIVE NEGATIVE mg/dL   Nitrite NEGATIVE NEGATIVE   Leukocytes,Ua NEGATIVE NEGATIVE    Comment: Performed at [facility], [address], [place], [place] [address]   CT ABDOMEN PELVIS W CONTRAST  Result Date: 11/13/2023 CLINICAL DATA:  Left lower quadrant abdominal pain. EXAM: CT ABDOMEN AND PELVIS WITH CONTRAST TECHNIQUE: Multidetector CT imaging of the abdomen and pelvis was performed using the standard protocol following bolus administration of intravenous contrast. RADIATION DOSE REDUCTION: This exam was performed according to the departmental dose-optimization program which includes automated exposure control, adjustment of the mA and/or kV according to patient size and/or use of iterative reconstruction technique. CONTRAST:  80mL OMNIPAQUE IOHEXOL 350 MG/ML SOLN COMPARISON:  CT dated 09/15/2023. FINDINGS: Lower chest: The visualized lung bases are clear. No intra-abdominal free air or free fluid. Hepatobiliary: The liver is unremarkable. No biliary dilatation. The gallbladder is physiologically distended. No calcified gallstone or pericholecystic fluid Pancreas: Unremarkable. No pancreatic ductal dilatation or surrounding inflammatory changes. Spleen: Normal in size without focal abnormality. Adrenals/Urinary Tract: The adrenal glands unremarkable. There is no hydronephrosis on either side. There is symmetric enhancement and excretion of contrast by both kidneys.  The visualized ureters and urinary bladder appear unremarkable. Small left posterior bladder diverticulum. Stomach/Bowel: There is diffuse mucosal enhancement and mild inflammatory changes of the: Consistent with pancolitis. Findings may be infectious in etiology or secondary to inflammatory bowel disease such as ulcerative colitis. Clinical correlation recommended. Loose stool throughout the colon suggestive of diarrheal state. Correlation with clinical exam and stool cultures recommended. There is a moderate size hiatal hernia. There is no bowel obstruction. The appendix is normal. Vascular/Lymphatic: Mild aortoiliac atherosclerotic disease. The IVC is unremarkable. No portal venous gas. There is no adenopathy. Reproductive: Hysterectomy.  No suspicious adnexal masses. Other: None Musculoskeletal: Osteopenia with degenerative changes. No acute osseous pathology. IMPRESSION: 1. Pancolitis and diarrheal state. Correlation with clinical exam and stool cultures recommended. No bowel obstruction. Normal appendix. 2. Moderate size hiatal hernia. 3.  Aortic Atherosclerosis (ICD10-I70.0). Electronically Signed   By: Elgie Collard M.D.   On: 11/13/2023 20:55    Pending Labs Unresulted Labs (From admission, onward)     Start     Ordered   11/14/23 0500  Basic metabolic panel  Tomorrow morning,   R        11/13/23 2156   11/14/23 0500  CBC  Tomorrow morning,   R        11/13/23 2156   11/13/23 2134  C Difficile Quick Screen w PCR reflex  (C Difficile quick screen w PCR reflex panel )  Once, for 24 hours,   TIMED       References:    CDiff Information Tool   11/13/23 2133   11/13/23 2133  Gastrointestinal Panel by PCR , Stool  (Gastrointestinal Panel by PCR, Stool                                                                                                                                                     **  Does Not include CLOSTRIDIUM DIFFICILE testing. **If CDIFF testing is needed, place order from the  "C Difficile Testing" order set.**)  Once,   R        11/13/23 2133   11/13/23 1656  C Difficile Quick Screen w PCR reflex  (C Difficile quick screen w PCR reflex panel )  Once, for 24 hours,   URGENT       References:    CDiff Information Tool   11/13/23 1655   11/13/23 1655  Gastrointestinal Panel by PCR , Stool  (Gastrointestinal Panel by PCR, Stool                                                                                                                                                     **Does Not include CLOSTRIDIUM DIFFICILE testing. **If CDIFF testing is needed, place order from the "C Difficile Testing" order set.**)  Once,   URGENT        11/13/23 1655            Vitals/Pain Today's Vitals   11/13/23 2006 11/13/23 2015 11/13/23 2030 11/13/23 2237  BP: (!) 108/57 (!) 105/57 109/60   Pulse: 69 67 65   Resp: 16 14 16    Temp:    98.6 F (37 C)  TempSrc:    Oral  SpO2: 94% 95% 94%   Weight:      Height:      PainSc:        Isolation Precautions Enteric precautions (UV disinfection)  Medications Medications  metroNIDAZOLE (FLAGYL) IVPB 500 mg (500 mg Intravenous New Bag/Given 11/13/23 2214)  potassium chloride SA (KLOR-CON M) CR tablet 40 mEq (has no administration in time range)  ALPRAZolam (XANAX) tablet 0.5 mg (has no administration in time range)  levothyroxine (SYNTHROID) tablet 50 mcg (has no administration in time range)  sertraline (ZOLOFT) tablet 25 mg (has no administration in time range)  pantoprazole (PROTONIX) EC tablet 40 mg (has no administration in time range)  apixaban (ELIQUIS) tablet 5 mg (has no administration in time range)  topiramate (TOPAMAX) tablet 50 mg (has no administration in time range)  potassium chloride SA (KLOR-CON M) CR tablet 20 mEq (has no administration in time range)  acetaminophen (TYLENOL) tablet 650 mg (has no administration in time range)    Or  acetaminophen (TYLENOL) suppository 650 mg (has no administration in time  range)  lactated ringers infusion (has no administration in time range)  cefTRIAXone (ROCEPHIN) 2 g in sodium chloride 0.9 % 100 mL IVPB (has no administration in time range)  metroNIDAZOLE (FLAGYL) IVPB 500 mg (has no administration in time range)  sodium chloride 0.9 % bolus 1,000 mL (0 mLs Intravenous Stopped 11/13/23 2131)  ondansetron (ZOFRAN) injection 4 mg (4 mg Intravenous Given 11/13/23 1922)  iohexol (OMNIPAQUE) 350 MG/ML injection 80 mL (80 mLs  Intravenous Contrast Given 11/13/23 1941)  cefTRIAXone (ROCEPHIN) 2 g in sodium chloride 0.9 % 100 mL IVPB (0 g Intravenous Stopped 11/13/23 2210)    Mobility manual wheelchair     Focused Assessments    R Recommendations: See Admitting Provider Note  Report given to:   Additional Notes:

## 2023-11-13 NOTE — H&P (Signed)
TRH H&P   Patient Demographics:    Patricia [name], is a 77 y.o. female  MRN: [id number]   DOB - 02/12/1946  Admit Date - 11/13/2023  Outpatient Primary MD for the patient is [name], MD  Referring MD/NP/PA: PA Tammy  Outpatient Specialists: GI Dr. Ane Payment, oncology Dr. Shirline Frees  Patient coming from: From home  Chief Complaint  Patient presents with   Weakness      HPI:    Patricia [name]  is a 77 y.o. female, 77 year old female with a history of Stage IV (T1c, N3, M1 C) non-small cell lung cancer diagnosed Jan 2024, hypertension, hyperlipidemia, hypothyroidism, depression/anxiety GERD, diagnosis of lymphocytic colitis s/p colonoscopy in july this year.  Patient has been Entercote which was discontinued 10/28/2023 as her lymphocytic colitis has been in remission with no further diarrhea, as well patient has not been taking her oral chemo Caryn Section for a few weeks as she could not tolerate side effect. -Presents to ED today secondary to complaints of diarrhea, and weakness, patient reports ongoing diarrhea for the last 10 days, roughly 3-4 episodes per day, watery, nonbloody, denies fever, chills, no vomiting, she does report some nausea and abdominal discomfort, she was instructed by PCP to come to ED. -In ED CT abdomen pelvis significant for pancolitis, she is afebrile, no leukocytosis, workup significant for low potassium at 3.4, she was started on IV Rocephin, and Flagyl, but given her frailty, significant diarrhea, [facility] hospitalist consulted to admit   Review of systems:    A full 10 point Review of Systems was done, except as stated above, all other Review of Systems were negative.   With Past History of the following :    Past Medical History:  Diagnosis Date   Depression    GERD (gastroesophageal reflux disease)    Hypercholesterolemia    IFG (impaired fasting  glucose)    Nonintractable episodic headache 01/18/2023   Osteopenia    PONV (postoperative nausea and vomiting)    Stroke [facility])       Past Surgical History:  Procedure Laterality Date   ABDOMINAL HYSTERECTOMY     BIOPSY  06/24/2023   Procedure: BIOPSY;  Surgeon: Dolores Frame, MD;  Location: [facility];  Service: Gastroenterology;;   CATARACT EXTRACTION W/PHACO  09/12/2011   Procedure: CATARACT EXTRACTION PHACO AND INTRAOCULAR LENS PLACEMENT (IOC);  Surgeon: Gemma Payor;  Location: [facility] ORS;  Service: Ophthalmology;  Laterality: Left;  CDE: 8.91   CATARACT EXTRACTION W/PHACO  11/18/2011   Procedure: CATARACT EXTRACTION PHACO AND INTRAOCULAR LENS PLACEMENT (IOC);  Surgeon: Gemma Payor;  Location: [facility];  Service: Ophthalmology;  Laterality: Right;  CDE:10.26   COLONOSCOPY  2007   Dr. Jena Gauss: internal hemorrhoids, single anal papilla poor prep.    COLONOSCOPY N/A 03/27/2017   Rourk: Normal exam.   COLONOSCOPY  06/24/2023   Procedure: COLONOSCOPY;  Surgeon: Dolores Frame,  MD;  Location: [facility];  Service: Gastroenterology;;   ECTOPIC PREGNANCY SURGERY     removal of right tube   ESOPHAGOGASTRODUODENOSCOPY (EGD) WITH PROPOFOL N/A 09/10/2021   Procedure: ESOPHAGOGASTRODUODENOSCOPY (EGD) WITH PROPOFOL;  Surgeon: Corbin Ade, MD;  Location: [facility];  Service: Endoscopy;  Laterality: N/A;  8:15AM   MALONEY DILATION N/A 09/10/2021   Procedure: Elease Hashimoto DILATION;  Surgeon: Corbin Ade, MD;  Location: [facility];  Service: Endoscopy;  Laterality: N/A;   OVARIAN CYST REMOVAL     right   RECTOCELE REPAIR N/A 01/18/2014   Procedure: POSTERIOR REPAIR (RECTOCELE);  Surgeon: Tilda Burrow, MD;  Location: [facility];  Service: Gynecology;  Laterality: N/A;   VAGINAL HYSTERECTOMY N/A 01/18/2014   Procedure: HYSTERECTOMY VAGINAL;  Surgeon: Tilda Burrow, MD;  Location: [facility];  Service: Gynecology;  Laterality: N/A;      Social History:     Social History    Tobacco Use   Smoking status: Former    Current packs/day: 0.00    Average packs/day: 1 pack/day for 20.0 years (20.0 ttl pk-yrs)    Types: Cigarettes    Start date: 09/10/1964    Quit date: 09/10/1984    Years since quitting: 39.2   Smokeless tobacco: Never  Substance Use Topics   Alcohol use: No      Family History :     Family History  Problem Relation Age of Onset   Osteoporosis Mother    Heart attack Father    Heart attack Brother 13       MI   Breast cancer Maternal Aunt    Breast cancer Paternal Aunt    Cancer Paternal Aunt        ovarian cancer   Anesthesia problems Neg Hx    Hypotension Neg Hx    Malignant hyperthermia Neg Hx    Pseudochol deficiency Neg Hx    Colon cancer Neg Hx    Inflammatory bowel disease Neg Hx      Home Medications:   Prior to Admission medications   Medication Sig Start Date End Date Taking? Authorizing Provider  sertraline (ZOLOFT) 25 MG tablet Take 25 mg by mouth daily. 10/31/23  Yes [provider]  ALPRAZolam Prudy Feeler) 0.5 MG tablet Take 1 tablet (0.5 mg total) by mouth at bedtime as needed for anxiety or sleep. TAKE (1) TABLET BY MOUTH AT BEDTIME AS NEEDED FOR SLEEP. 09/18/23   Campbell Riches, NP  apixaban (ELIQUIS) 5 MG TABS tablet Take 1 tablet (5 mg total) by mouth 2 (two) times daily. 10/15/23   Johnson, Clanford L, MD  budesonide (ENTOCORT EC) 3 MG 24 hr capsule TAKE 2 CAPSULES (6 MG TOTAL) BY MOUTH DAILY. 10/22/23   Rourk, Gerrit Friends, MD  famotidine (PEPCID) 40 MG tablet Take 1 tablet (40 mg total) by mouth daily. 07/10/23   [name], MD  KLOR-CON M20 20 MEQ tablet TAKE 1 TABLET BY MOUTH THREE TIMES A DAY 11/10/23   [name], MD  lansoprazole (PREVACID) 30 MG capsule Take 30 mg by mouth daily. 08/06/23   [provider]  levothyroxine (SYNTHROID) 50 MCG tablet TAKE 1 TABLET BY MOUTH DAILY BEFORE BREAKFAST 06/06/23   Si Gaul, MD  ondansetron (ZOFRAN) 8 MG tablet Take 0.5 tablets (4 mg total)  by mouth every 8 (eight) hours as needed for nausea or vomiting. 09/29/23   Si Gaul, MD  topiramate (TOPAMAX) 100 MG tablet Take 1 tablet (100 mg total)  by mouth 2 (two) times daily. 10/22/23   Campbell Riches, NP  topiramate (TOPAMAX) 50 MG tablet TAKE 1 TABLET BY MOUTH TWICE A DAY 11/12/23   Vaslow, Georgeanna Lea, MD     Allergies:     Allergies  Allergen Reactions   Codeine Nausea Only     Physical Exam:   Vitals  Blood pressure 109/60, pulse 65, temperature 97.8 F (36.6 C), temperature source Axillary, resp. rate 16, height 4\' 8"  (1.422 m), weight 45.4 kg, SpO2 94%.   1. General Female, deconditioned, laying in bed in no apparent distress  2. Normal affect and insight, Not Suicidal or Homicidal, Awake Alert, Oriented X 3.  3. No F.N deficits, ALL C.Nerves Intact, Strength 5/5 all 4 extremities, Sensation intact all 4 extremities, Plantars down going.  4. Ears and Eyes appear Normal, Conjunctivae clear, sound at baseline. Moist Oral Mucosa.  5. Supple Neck, No JVD,  No Carotid Bruits.  6. Symmetrical Chest wall movement, Good air movement bilaterally, scattered Rales  7. RRR, No Gallops, Rubs or Murmurs, No Parasternal Heave.  8. Positive Bowel Sounds, Abdomen Soft, minimal lower abdomen tenderness to palpation, No organomegaly appriciated,No rebound -guarding or rigidity.  9.  No Cyanosis, Normal Skin Turgor, No Skin Rash or Bruise.  10. Good muscle tone,  joints appear normal , no effusions, Normal ROM.    Data Review:    CBC Recent Labs  Lab 11/13/23 1152  WBC 4.8  HGB 14.0  HCT 43.7  PLT 240  MCV 91.8  MCH 29.4  MCHC 32.0  RDW 16.0*  LYMPHSABS 2.0  MONOABS 0.7  EOSABS 0.1  BASOSABS 0.1   ------------------------------------------------------------------------------------------------------------------  Chemistries  Recent Labs  Lab 11/13/23 1152  NA 139  K 3.4*  CL 110  CO2 19*  GLUCOSE 100*  BUN 22  CREATININE 0.76  CALCIUM 9.6   MG 2.0  AST 19  ALT 24  ALKPHOS 107  BILITOT 0.9   ------------------------------------------------------------------------------------------------------------------ estimated creatinine clearance is 38.3 mL/min (by C-G formula based on SCr of 0.76 mg/dL). ------------------------------------------------------------------------------------------------------------------ No results for input(s): "TSH", "T4TOTAL", "T3FREE", "THYROIDAB" in the last 72 hours.  Invalid input(s): "FREET3"  Coagulation profile No results for input(s): "INR", "PROTIME" in the last 168 hours. ------------------------------------------------------------------------------------------------------------------- No results for input(s): "DDIMER" in the last 72 hours. -------------------------------------------------------------------------------------------------------------------  Cardiac Enzymes No results for input(s): "CKMB", "TROPONINI", "MYOGLOBIN" in the last 168 hours.  Invalid input(s): "CK" ------------------------------------------------------------------------------------------------------------------ No results found for: "BNP"   ---------------------------------------------------------------------------------------------------------------  Urinalysis    Component Value Date/Time   COLORURINE YELLOW 11/13/2023 2048   APPEARANCEUR CLEAR 11/13/2023 2048   LABSPEC >1.046 (H) 11/13/2023 2048   PHURINE 5.0 11/13/2023 2048   GLUCOSEU NEGATIVE 11/13/2023 2048   HGBUR NEGATIVE 11/13/2023 2048   BILIRUBINUR NEGATIVE 11/13/2023 2048   KETONESUR 5 (A) 11/13/2023 2048   PROTEINUR NEGATIVE 11/13/2023 2048   UROBILINOGEN 0.2 01/12/2014 1045   NITRITE NEGATIVE 11/13/2023 2048   LEUKOCYTESUR NEGATIVE 11/13/2023 2048    ----------------------------------------------------------------------------------------------------------------   Imaging Results:    CT ABDOMEN PELVIS W CONTRAST  Result Date:  11/13/2023 CLINICAL DATA:  Left lower quadrant abdominal pain. EXAM: CT ABDOMEN AND PELVIS WITH CONTRAST TECHNIQUE: Multidetector CT imaging of the abdomen and pelvis was performed using the standard protocol following bolus administration of intravenous contrast. RADIATION DOSE REDUCTION: This exam was performed according to the departmental dose-optimization program which includes automated exposure control, adjustment of the mA and/or kV according to patient size and/or use of iterative reconstruction technique. CONTRAST:  80mL OMNIPAQUE IOHEXOL 350 MG/ML SOLN COMPARISON:  CT dated 09/15/2023. FINDINGS: Lower chest: The visualized lung bases are clear. No intra-abdominal free air or free fluid. Hepatobiliary: The liver is unremarkable. No biliary dilatation. The gallbladder is physiologically distended. No calcified gallstone or pericholecystic fluid Pancreas: Unremarkable. No pancreatic ductal dilatation or surrounding inflammatory changes. Spleen: Normal in size without focal abnormality. Adrenals/Urinary Tract: The adrenal glands unremarkable. There is no hydronephrosis on either side. There is symmetric enhancement and excretion of contrast by both kidneys. The visualized ureters and urinary bladder appear unremarkable. Small left posterior bladder diverticulum. Stomach/Bowel: There is diffuse mucosal enhancement and mild inflammatory changes of the: Consistent with pancolitis. Findings may be infectious in etiology or secondary to inflammatory bowel disease such as ulcerative colitis. Clinical correlation recommended. Loose stool throughout the colon suggestive of diarrheal state. Correlation with clinical exam and stool cultures recommended. There is a moderate size hiatal hernia. There is no bowel obstruction. The appendix is normal. Vascular/Lymphatic: Mild aortoiliac atherosclerotic disease. The IVC is unremarkable. No portal venous gas. There is no adenopathy. Reproductive: Hysterectomy.  No suspicious  adnexal masses. Other: None Musculoskeletal: Osteopenia with degenerative changes. No acute osseous pathology. IMPRESSION: 1. Pancolitis and diarrheal state. Correlation with clinical exam and stool cultures recommended. No bowel obstruction. Normal appendix. 2. Moderate size hiatal hernia. 3.  Aortic Atherosclerosis (ICD10-I70.0). Electronically Signed   By: Elgie Collard M.D.   On: 11/13/2023 20:55     EKG:  Vent. rate 93 BPM PR interval 126 ms QRS duration 70 ms QT/QTcB 346/430 ms P-R-T axes 19 27 73 Normal sinus rhythm Cannot rule out Inferior infarct , age undetermined Cannot rule out Anterior infarct , age undetermined Abnormal ECG When compared with ECG of 13-Oct-2023 10:24, No significant change since last tracing   Assessment & Plan:    Principal Problem:   Pancolitis (HCC) Active Problems:   Acute CVA (cerebrovascular accident) (HCC)   GERD without esophagitis   Anxiety and depression   Non-small cell lung cancer, left (HCC)   Embolic stroke (HCC)   DVT of lower extremity, bilateral (HCC)   Acquired hypothyroidism   Diarrhea   Diarrhea Colitis -Presents with diarrhea over the last 10 days, her imaging significant for colitis, infectious versus lymphocytic colitis, for now keep empirically on IV Rocephin and Flagyl, pending GI panel and C. difficile. -I think this is most likely in the setting of lymphocytic colitis, as her Entercote has been discontinued  recently given she has improved with no symptoms, will request GI input to evaluate patient tomorrow to see if she needs to be resumed back on steroids Meanwhile we will keep on as needed Zofran for nausea-consult nutritionist as well given poor oral intake and failure to thrive, will keep on IV fluids   Hypokalemia -Repleted  Failure to thrive Deconditioning -Will consult nutritional service and PT  Anxiety-depression -Continue with sertraline and alprazolam  Stage IV non-small cell lung cancer -she has  been followed by Dr. Shirline Frees, she was on [place],  she has not been taking for few weeks as she could not tolerate side effects  History of embolic CVA -Started on apixaban February 2024 for embolic stroke  DVT Prophylaxis Eliquis  AM Labs Ordered, also please review Full Orders  Family Communication: Admission, patients condition and plan of care including tests being ordered have been discussed with the patient ,husband and sister at bedside who indicate understanding and agree with the plan and Code Status.  Code Status DNR  Likely  DC to home  Condition GUARDED    Consults called: GI requested in epic  Admission status: Observation  Time spent in minutes : 70 minutes   Huey Bienenstock M.D on 11/13/2023 at 9:56 PM   [facility] - Office  [phone]

## 2023-11-13 NOTE — ED Notes (Signed)
Messaged Dr. Arville Care about patients BP at 94/57, now trending lower. Instructed to give 250 ml NS bolus and re-evaluate.

## 2023-11-14 ENCOUNTER — Other Ambulatory Visit (HOSPITAL_COMMUNITY): Payer: Self-pay

## 2023-11-14 DIAGNOSIS — F32A Depression, unspecified: Secondary | ICD-10-CM | POA: Diagnosis not present

## 2023-11-14 DIAGNOSIS — M8580 Other specified disorders of bone density and structure, unspecified site: Secondary | ICD-10-CM | POA: Diagnosis not present

## 2023-11-14 DIAGNOSIS — E039 Hypothyroidism, unspecified: Secondary | ICD-10-CM

## 2023-11-14 DIAGNOSIS — I10 Essential (primary) hypertension: Secondary | ICD-10-CM | POA: Diagnosis not present

## 2023-11-14 DIAGNOSIS — I82403 Acute embolism and thrombosis of unspecified deep veins of lower extremity, bilateral: Secondary | ICD-10-CM | POA: Diagnosis not present

## 2023-11-14 DIAGNOSIS — A048 Other specified bacterial intestinal infections: Secondary | ICD-10-CM | POA: Diagnosis not present

## 2023-11-14 DIAGNOSIS — B9681 Helicobacter pylori [H. pylori] as the cause of diseases classified elsewhere: Secondary | ICD-10-CM | POA: Diagnosis not present

## 2023-11-14 DIAGNOSIS — R54 Age-related physical debility: Secondary | ICD-10-CM | POA: Diagnosis not present

## 2023-11-14 DIAGNOSIS — F419 Anxiety disorder, unspecified: Secondary | ICD-10-CM | POA: Diagnosis not present

## 2023-11-14 DIAGNOSIS — A0472 Enterocolitis due to Clostridium difficile, not specified as recurrent: Secondary | ICD-10-CM | POA: Diagnosis not present

## 2023-11-14 DIAGNOSIS — K52832 Lymphocytic colitis: Secondary | ICD-10-CM | POA: Diagnosis not present

## 2023-11-14 DIAGNOSIS — Z885 Allergy status to narcotic agent status: Secondary | ICD-10-CM | POA: Diagnosis not present

## 2023-11-14 DIAGNOSIS — I6340 Cerebral infarction due to embolism of unspecified cerebral artery: Secondary | ICD-10-CM

## 2023-11-14 DIAGNOSIS — R197 Diarrhea, unspecified: Secondary | ICD-10-CM | POA: Diagnosis not present

## 2023-11-14 DIAGNOSIS — Z90710 Acquired absence of both cervix and uterus: Secondary | ICD-10-CM | POA: Diagnosis not present

## 2023-11-14 DIAGNOSIS — E876 Hypokalemia: Secondary | ICD-10-CM | POA: Diagnosis not present

## 2023-11-14 DIAGNOSIS — C3492 Malignant neoplasm of unspecified part of left bronchus or lung: Secondary | ICD-10-CM

## 2023-11-14 DIAGNOSIS — Z8249 Family history of ischemic heart disease and other diseases of the circulatory system: Secondary | ICD-10-CM | POA: Diagnosis not present

## 2023-11-14 DIAGNOSIS — Z66 Do not resuscitate: Secondary | ICD-10-CM | POA: Diagnosis not present

## 2023-11-14 DIAGNOSIS — B9689 Other specified bacterial agents as the cause of diseases classified elsewhere: Secondary | ICD-10-CM

## 2023-11-14 DIAGNOSIS — K5100 Ulcerative (chronic) pancolitis without complications: Secondary | ICD-10-CM | POA: Diagnosis not present

## 2023-11-14 DIAGNOSIS — R627 Adult failure to thrive: Secondary | ICD-10-CM | POA: Diagnosis not present

## 2023-11-14 DIAGNOSIS — K219 Gastro-esophageal reflux disease without esophagitis: Secondary | ICD-10-CM

## 2023-11-14 DIAGNOSIS — Z803 Family history of malignant neoplasm of breast: Secondary | ICD-10-CM | POA: Diagnosis not present

## 2023-11-14 DIAGNOSIS — Z79899 Other long term (current) drug therapy: Secondary | ICD-10-CM | POA: Diagnosis not present

## 2023-11-14 DIAGNOSIS — I639 Cerebral infarction, unspecified: Secondary | ICD-10-CM | POA: Diagnosis not present

## 2023-11-14 DIAGNOSIS — F418 Other specified anxiety disorders: Secondary | ICD-10-CM | POA: Diagnosis not present

## 2023-11-14 DIAGNOSIS — Z8673 Personal history of transient ischemic attack (TIA), and cerebral infarction without residual deficits: Secondary | ICD-10-CM | POA: Diagnosis not present

## 2023-11-14 DIAGNOSIS — Z87891 Personal history of nicotine dependence: Secondary | ICD-10-CM | POA: Diagnosis not present

## 2023-11-14 DIAGNOSIS — A09 Infectious gastroenteritis and colitis, unspecified: Secondary | ICD-10-CM | POA: Diagnosis not present

## 2023-11-14 DIAGNOSIS — E7800 Pure hypercholesterolemia, unspecified: Secondary | ICD-10-CM | POA: Diagnosis not present

## 2023-11-14 DIAGNOSIS — Z5971 Insufficient health insurance coverage: Secondary | ICD-10-CM | POA: Diagnosis not present

## 2023-11-14 DIAGNOSIS — Z79890 Hormone replacement therapy (postmenopausal): Secondary | ICD-10-CM | POA: Diagnosis not present

## 2023-11-14 DIAGNOSIS — Z7901 Long term (current) use of anticoagulants: Secondary | ICD-10-CM | POA: Diagnosis not present

## 2023-11-14 LAB — BASIC METABOLIC PANEL
Anion gap: 9 (ref 5–15)
BUN: 16 mg/dL (ref 8–23)
CO2: 18 mmol/L — ABNORMAL LOW (ref 22–32)
Calcium: 8.7 mg/dL — ABNORMAL LOW (ref 8.9–10.3)
Chloride: 112 mmol/L — ABNORMAL HIGH (ref 98–111)
Creatinine, Ser: 0.55 mg/dL (ref 0.44–1.00)
GFR, Estimated: >60 mL/min (ref >60)
Glucose, Bld: 98 mg/dL (ref 70–99)
Potassium: 3.1 mmol/L — ABNORMAL LOW (ref 3.5–5.1)
Sodium: 139 mmol/L (ref 135–145)

## 2023-11-14 LAB — CBC
HCT: 37.3 % (ref 36.0–46.0)
Hemoglobin: 11.4 g/dL — ABNORMAL LOW (ref 12.0–15.0)
MCH: 28.4 pg (ref 26.0–34.0)
MCHC: 30.6 g/dL (ref 30.0–36.0)
MCV: 92.8 fL (ref 80.0–100.0)
Platelets: 195 10*3/uL (ref 150–400)
RBC: 4.02 MIL/uL (ref 3.87–5.11)
RDW: 15.7 % — ABNORMAL HIGH (ref 11.5–15.5)
WBC: 4.2 10*3/uL (ref 4.0–10.5)
nRBC: 0 % (ref 0.0–0.2)

## 2023-11-14 LAB — CLOSTRIDIUM DIFFICILE BY PCR, REFLEXED: Toxigenic C. Difficile by PCR: POSITIVE — AB

## 2023-11-14 MED ORDER — FIDAXOMICIN 200 MG PO TABS: 200.0000 mg | ORAL_TABLET | Freq: Two times a day (BID) | ORAL | Status: DC

## 2023-11-14 MED ORDER — ADULT MULTIVITAMIN W/MINERALS CH
1.0000 | ORAL_TABLET | Freq: Every day | ORAL | Status: DC
Start: 2023-11-14 — End: 2023-11-15
  Administered 2023-11-14 – 2023-11-15 (×2): 1 via ORAL
  Filled 2023-11-14 (×2): qty 1

## 2023-11-14 MED ORDER — BOOST / RESOURCE BREEZE PO LIQD CUSTOM: 1.0000 | Freq: Three times a day (TID) | ORAL | Status: DC

## 2023-11-14 MED ORDER — VANCOMYCIN HCL 125 MG PO CAPS
125.0000 mg | ORAL_CAPSULE | Freq: Four times a day (QID) | ORAL | Status: DC
  Administered 2023-11-14 – 2023-11-15 (×4): 125 mg via ORAL
  Filled 2023-11-14 (×4): qty 1

## 2023-11-14 NOTE — Progress Notes (Addendum)
Initial Nutrition Assessment  DOCUMENTATION CODES:   Not applicable  INTERVENTION:   Boost Breeze po BID, each supplement provides 250 kcal and 9 grams of protein. MVI with minerals daily.  NUTRITION DIAGNOSIS:   Inadequate oral intake related to diarrhea as evidenced by per patient/family report.  GOAL:   Patient will meet greater than or equal to 90% of their needs  MONITOR:   PO intake, Supplement acceptance  REASON FOR ASSESSMENT:   Consult Assessment of nutrition requirement/status  ASSESSMENT:   77 yo female admitted with pancolitis. PMH includes stage IV NSC lung cancer, lymphocytic colitis, HLD, GERD, depression, impaired fasting glucose, osteopenia, stroke, esophageal dilation.  Patient has had diarrhea for the past 10 days PTA. She has been eating poorly. C. Diff positive. GI following.   Labs reviewed. K 3.1, Hgb 11.4  Medications reviewed and include protonix, klor-con, vancomycin. IVF: LR at 50 ml/h.  Weight history reviewed. Weight has been trending down since September 2024. 15% weight loss within the past 3 months is severe for the time frame. Suspect patient is malnourishes. Unable to obtain enough information at this time for identification of malnutrition.   Currently on a regular diet. Meal intakes 50% x 1 meal.  NUTRITION - FOCUSED PHYSICAL EXAM:  Unable to complete, RD working remotely  Diet Order:   Diet Order             Diet regular Room service appropriate? Yes; Fluid consistency: Thin  Diet effective now                   EDUCATION NEEDS:   Not appropriate for education at this time  Skin:  Skin Assessment: Reviewed RN Assessment  Last BM:  12/6 type 7  Height:   Ht Readings from Last 1 Encounters:  11/13/23 4\' 8"  (1.422 m)    Weight:   Wt Readings from Last 1 Encounters:  11/13/23 45.4 kg    Ideal Body Weight:  42.4 kg  BMI:  Body mass index is 22.42 kg/m.  Estimated Nutritional Needs:   Kcal:   1300-1500  Protein:  60-70 gm  Fluid:  1.3-1.5 L   Gabriel Rainwater RD, LDN, CNSC Please refer to Amion for contact information.

## 2023-11-14 NOTE — TOC Benefit Eligibility Note (Signed)
Pharmacy Patient Advocate Encounter  Insurance verification completed.    The patient is insured through [employer] test claim for [employer]. Currently a quantity of 20 is a 10 day supply and the co-pay is $1,225.20 .  Co-pay going toward patient coverage gap/donut hole.   Has government insurance so not eligible for voucher.   This test claim was processed through [facility]- copay amounts may vary at other pharmacies due to pharmacy/plan contracts, or as the patient moves through the different stages of their insurance plan.

## 2023-11-14 NOTE — TOC Benefit Eligibility Note (Signed)
Pharmacy Patient Advocate Encounter  Insurance verification completed.    The patient is insured through [employer] test claim for Vancomycin. Currently a quantity of 40 is a 10 day supply and the co-pay is $7.60 .   This test claim was processed through [facility]- copay amounts may vary at other pharmacies due to pharmacy/plan contracts, or as the patient moves through the different stages of their insurance plan.

## 2023-11-14 NOTE — Hospital Course (Signed)
77 year old female with a history of Stage IV (T1c, N3, M1 C) non-small cell lung cancer diagnosed Jan 2024, hypertension, hyperlipidemia, hypothyroidism, depression/anxiety GERD, diagnosis of lymphocytic colitis s/p colonoscopy in july this year.  Patient has been Entercote which was discontinued 10/28/2023 as her lymphocytic colitis has been in remission with no further diarrhea, as well patient has not been taking her oral chemo Caryn Section for a few weeks as she could not tolerate side effect. -Presents to ED today secondary to complaints of diarrhea, and weakness, patient reports ongoing diarrhea for the last 10 days, roughly 3-4 episodes per day, watery, nonbloody, denies fever, chills, no vomiting, she does report some nausea and abdominal discomfort, she was instructed by PCP to come to ED.  In ED CT abdomen pelvis significant for pancolitis, she is afebrile, no leukocytosis, workup significant for low potassium at 3.4, she was started on IV Rocephin, and Flagyl, but given her frailty, significant diarrhea, [facility] hospitalist consulted to admit

## 2023-11-14 NOTE — TOC Initial Note (Signed)
Transition of Care [facility]) - Initial/Assessment Note    Patient Details  Name: Patricia [name] MRN: [id number] Date of Birth: 05-03-1946  Transition of [facility]) CM/SW Contact:    Leitha Bleak, RN Phone Number: 11/14/2023, 12:18 PM  Clinical Narrative:   Patient in observation for pancolitis. Patient lives at home with her husband. PT is recommending HHPT. Patient is already active with [facility] for HHPT/OT. MD updated to place resumptions orders. Add to AVS.         Expected Discharge Plan: Home w Home Health Services Barriers to Discharge: Continued Medical Work up   Patient Goals and CMS Choice Patient states their goals for this hospitalization and ongoing recovery are:: to go home CMS Medicare.gov Compare Post Acute Care list provided to:: Patient Represenative (must comment) Choice offered to / list presented to : Spouse [place] ownership interest in [facility].provided to:: Patient    Expected Discharge Plan and Services      Living arrangements for the past 2 months: Single Family Home            Date [facility] Agency Contacted: 11/14/23 Time HH Agency Contacted: 1218 Representative spoke with at [facility] Agency: Sarah  Prior Living Arrangements/Services Living arrangements for the past 2 months: Single Family Home Lives with:: Spouse                   Activities of Daily Living   ADL Screening (condition at time of admission) Independently performs ADLs?: No Does the patient have a NEW difficulty with bathing/dressing/toileting/self-feeding that is expected to last >3 days?: Yes (Initiates electronic notice to provider for possible OT consult) Does the patient have a NEW difficulty with getting in/out of bed, walking, or climbing stairs that is expected to last >3 days?: No Does the patient have a NEW difficulty with communication that is expected to last >3 days?: No Is the patient deaf or have difficulty hearing?: No Does the patient have difficulty  seeing, even when wearing glasses/contacts?: No Does the patient have difficulty concentrating, remembering, or making decisions?: No  Permission Sought/Granted        Emotional Assessment     Affect (typically observed): Accepting Orientation: : Oriented to Self, Oriented to Place, Oriented to  Time, Oriented to Situation Alcohol / Substance Use: Not Applicable Psych Involvement: No (comment)  Admission diagnosis:  Pancolitis (HCC) [K51.00] Lower abdominal pain [R10.30] Generalized weakness [R53.1] Diarrhea, unspecified type [R19.7] Patient Active Problem List   Diagnosis Date Noted   Pancolitis (HCC) 11/13/2023   Acute hepatitis 10/13/2023   Thrombocytopenia (HCC) 10/13/2023   Failure to thrive in adult 10/13/2023   Vertigo 07/15/2023   Chronic daily headache 07/15/2023   Lower abdominal pain 07/03/2023   Lymphocytic colitis 06/25/2023   Diarrhea 06/24/2023   Hypokalemia 06/23/2023   Dehydration 06/23/2023   Prolonged QT interval 06/23/2023   Acquired hypothyroidism 06/23/2023   History of stroke 06/23/2023   Colitis 06/22/2023   Weight loss 06/03/2023   Paroxysmal cough 02/03/2023   DVT of lower extremity, bilateral (HCC) 02/03/2023   Pain of left lower extremity 01/18/2023   Embolic stroke (HCC) 01/17/2023   Acute CVA (cerebrovascular accident) (HCC) 01/11/2023   Encounter for antineoplastic chemotherapy 12/30/2022   Non-small cell lung cancer, left (HCC) 12/19/2022   Left lower lobe pulmonary nodule 11/22/2022   Lymphadenopathy of head and neck region 11/22/2022   Hoarseness 09/07/2022   Primary hypertension 08/30/2022   Hiatal hernia 08/05/2022   Persistent depressive disorder, moderate  08/03/2022   Dysphagia 07/13/2021   Anxiety and depression 07/23/2017   Encounter for antineoplastic immunotherapy 02/19/2017   Prolapse of vaginal vault after hysterectomy 12/20/2016   GERD without esophagitis 04/11/2014   Insomnia 04/11/2014   Post-operative state  02/07/2014   Rectocele 12/22/2013   Impaired fasting glucose 08/27/2013   Mixed hyperlipidemia 08/27/2013   Osteopenia 08/27/2013   PCP:  [name], MD Pharmacy:   [phone] [place], [place] - [address] AT [facility] [address] [place] [address] Phone: [phone] Fax: [phone]  [facility] [address] [place] [address] Phone: [phone] Fax: [phone]  [facility] [address] [place] [address] Phone: [phone] Fax: [phone]     Social Determinants of Health (SDOH) Social History: SDOH Screenings   Food Insecurity: No Food Insecurity (11/14/2023)  Housing: Low Risk  (11/14/2023)  Transportation Needs: No Transportation Needs (11/14/2023)  Utilities: Not At Risk (11/14/2023)  Depression (PHQ2-9): High Risk (10/22/2023)  Social Connections: Unknown (04/20/2022)   Received from [facility]  Tobacco Use: Medium Risk (11/13/2023)   SDOH Interventions:    Readmission Risk Interventions    10/15/2023   12:03 PM 06/23/2023    9:18 AM  Readmission Risk Prevention Plan  Transportation Screening Complete Complete  Medication Review [employer]) Complete Complete  HRI or Home Care Consult Complete Complete  SW Recovery Care/Counseling Consult Complete Complete  Palliative Care Screening Not Applicable Not Applicable  Skilled Nursing Facility Not Applicable Not Applicable

## 2023-11-14 NOTE — Progress Notes (Addendum)
PROGRESS NOTE   Patricia [name]  WUJ:[id number] DOB: Apr 13, 1946 DOA: 11/13/2023 PCP: [name], MD   Chief Complaint  Patient presents with   Weakness   Level of care: Med-Surg  Brief Admission History:  77 year old female with a history of Stage IV (T1c, N3, M1 C) non-small cell lung cancer diagnosed Jan 2024, hypertension, hyperlipidemia, hypothyroidism, depression/anxiety GERD, diagnosis of lymphocytic colitis s/p colonoscopy in july this year.  Patient has been Entercote which was discontinued 10/28/2023 as her lymphocytic colitis has been in remission with no further diarrhea, as well patient has not been taking her oral chemo Caryn Section for a few weeks as she could not tolerate side effect. -Presents to ED today secondary to complaints of diarrhea, and weakness, patient reports ongoing diarrhea for the last 10 days, roughly 3-4 episodes per day, watery, nonbloody, denies fever, chills, no vomiting, she does report some nausea and abdominal discomfort, she was instructed by PCP to come to ED.  In ED CT abdomen pelvis significant for pancolitis, she is afebrile, no leukocytosis, workup significant for low potassium at 3.4, she was started on IV Rocephin, and Flagyl, but given her frailty, significant diarrhea, [facility] hospitalist consulted to admit   Assessment and Plan:  C difficile colitis  - initially started on fidaxomicin but the cost is too high per benefits check of her insurance.   - GI started on oral vancomycin which has a reasonable cost per benefits check - this is cause of her diarrhea and should improve with treatment - continue supportive care for now  Adult FTT - PT eval requested  Hypokalemia - check Mg - replacement ordered  - recheck in AM   Anxiety with depression  - resumed home sertraline and alprazolam  Stage IV NSC lung cancer - followed by [facility]  History of embolic CVA - apixaban for stroke prophylaxis since Feb 2024   DVT prophylaxis: apixaban   Code Status: DNR  Family Communication:  Disposition: anticipate home in 1-2 days    Consultants:  GI  Procedures:   Antimicrobials:  Vancomycin oral 12/6>>   Subjective: Pt continues to have frequent diarrhea and abdominal tenderness. Poor appetite.   Objective: Vitals:   11/13/23 2355 11/14/23 0000 11/14/23 0021 11/14/23 0445  BP: 103/72 114/82 101/65 103/62  Pulse: 80 84 68 66  Resp: 17 17 18 16   Temp:   97.9 F (36.6 C)   TempSrc:   Oral   SpO2: 97% 100% 99% 97%  Weight:      Height:        Intake/Output Summary (Last 24 hours) at 11/14/2023 1619 Last data filed at 11/14/2023 0900 Gross per 24 hour  Intake 678.06 ml  Output --  Net 678.06 ml   Filed Weights   11/13/23 1136  Weight: 45.4 kg   Examination:  General exam: Appears calm and comfortable  Respiratory system: Clear to auscultation. Respiratory effort normal. Cardiovascular system: normal S1 & S2 heard. No JVD, murmurs, rubs, gallops or clicks. No pedal edema. Gastrointestinal system: Abdomen is nondistended, soft and tender to light palpation. No organomegaly or masses felt. Normal bowel sounds heard. Central nervous system: Alert and oriented. No focal neurological deficits. Extremities: Symmetric 5 x 5 power. Skin: No rashes, lesions or ulcers. Psychiatry: Judgement and insight appear normal. Mood & affect appropriate.   Data Reviewed: I have personally reviewed following labs and imaging studies  CBC: Recent Labs  Lab 11/13/23 1152 11/14/23 0439  WBC 4.8 4.2  NEUTROABS 1.9  --  HGB 14.0 11.4*  HCT 43.7 37.3  MCV 91.8 92.8  PLT 240 195    Basic Metabolic Panel: Recent Labs  Lab 11/13/23 1152 11/14/23 0439  NA 139 139  K 3.4* 3.1*  CL 110 112*  CO2 19* 18*  GLUCOSE 100* 98  BUN 22 16  CREATININE 0.76 0.55  CALCIUM 9.6 8.7*  MG 2.0  --     CBG: No results for input(s): "GLUCAP" in the last 168 hours.  Recent Results (from the past 240 hour(s))  C Difficile Quick Screen  w PCR reflex     Status: Abnormal   Collection Time: 11/13/23  9:31 PM   Specimen: Stool  Result Value Ref Range Status   C Diff antigen POSITIVE (A) NEGATIVE Final    Comment: RESULT CALLED TO, READ BACK BY AND VERIFIED WITH: E. SHAFFER AT 2335 ON 12.05.24 BY ADGER J     C Diff toxin NEGATIVE NEGATIVE Final    Comment: RESULT CALLED TO, READ BACK BY AND VERIFIED WITH: E. SHAFFER AT 2335 ON 12.05.24 BY ADGER J     C Diff interpretation Results are indeterminate. See PCR results.  Final    Comment: RESULT CALLED TO, READ BACK BY AND VERIFIED WITH: E. SHAFFER AT 2335 ON 12.05.24 BY ADGER J  Performed at [facility], [address], [place], [place] [address]   C. Diff by PCR, Reflexed     Status: Abnormal   Collection Time: 11/13/23  9:31 PM  Result Value Ref Range Status   Toxigenic C. Difficile by PCR POSITIVE (A) NEGATIVE Final    Comment: Positive for toxigenic C. difficile with little to no toxin production. Only treat if clinical presentation suggests symptomatic illness. Performed at [facility], [address] [place], [place] [address]      Radiology Studies: CT ABDOMEN PELVIS W CONTRAST  Result Date: 11/13/2023 CLINICAL DATA:  Left lower quadrant abdominal pain. EXAM: CT ABDOMEN AND PELVIS WITH CONTRAST TECHNIQUE: Multidetector CT imaging of the abdomen and pelvis was performed using the standard protocol following bolus administration of intravenous contrast. RADIATION DOSE REDUCTION: This exam was performed according to the departmental dose-optimization program which includes automated exposure control, adjustment of the mA and/or kV according to patient size and/or use of iterative reconstruction technique. CONTRAST:  80mL OMNIPAQUE IOHEXOL 350 MG/ML SOLN COMPARISON:  CT dated 09/15/2023. FINDINGS: Lower chest: The visualized lung bases are clear. No intra-abdominal free air or free fluid. Hepatobiliary: The liver is unremarkable. No biliary dilatation. The  gallbladder is physiologically distended. No calcified gallstone or pericholecystic fluid Pancreas: Unremarkable. No pancreatic ductal dilatation or surrounding inflammatory changes. Spleen: Normal in size without focal abnormality. Adrenals/Urinary Tract: The adrenal glands unremarkable. There is no hydronephrosis on either side. There is symmetric enhancement and excretion of contrast by both kidneys. The visualized ureters and urinary bladder appear unremarkable. Small left posterior bladder diverticulum. Stomach/Bowel: There is diffuse mucosal enhancement and mild inflammatory changes of the: Consistent with pancolitis. Findings may be infectious in etiology or secondary to inflammatory bowel disease such as ulcerative colitis. Clinical correlation recommended. Loose stool throughout the colon suggestive of diarrheal state. Correlation with clinical exam and stool cultures recommended. There is a moderate size hiatal hernia. There is no bowel obstruction. The appendix is normal. Vascular/Lymphatic: Mild aortoiliac atherosclerotic disease. The IVC is unremarkable. No portal venous gas. There is no adenopathy. Reproductive: Hysterectomy.  No suspicious adnexal masses. Other: None Musculoskeletal: Osteopenia with degenerative changes. No acute osseous pathology. IMPRESSION:  1. Pancolitis and diarrheal state. Correlation with clinical exam and stool cultures recommended. No bowel obstruction. Normal appendix. 2. Moderate size hiatal hernia. 3.  Aortic Atherosclerosis (ICD10-I70.0). Electronically Signed   By: Elgie Collard M.D.   On: 11/13/2023 20:55    Scheduled Meds:  apixaban  5 mg Oral BID   feeding supplement  1 Container Oral TID BM   levothyroxine  50 mcg Oral Q0600   multivitamin with minerals  1 tablet Oral Daily   pantoprazole  40 mg Oral Daily   potassium chloride SA  20 mEq Oral TID   sertraline  25 mg Oral Daily   topiramate  50 mg Oral BID   vancomycin  125 mg Oral QID   Continuous  Infusions:  lactated ringers 50 mL/hr at 11/14/23 1447     LOS: 0 days   Time spent: 50 mins  Marialice Newkirk Laural Benes, MD How to contact the [facility] Attending or Consulting provider 7A - 7P or covering provider during after hours 7P -7A, for this patient?  Check the care team in [facility] and look for a) attending/consulting [facility] provider listed and b) the [facility] team listed Log into [email or web address] to find provider on call.  Locate the [facility] provider you are looking for under [facility] and page to a number that you can be directly reached. If you still have difficulty reaching the provider, please page the [facility] (Director on Call) for the Hospitalists listed on amion for assistance.  11/14/2023, 4:19 PM

## 2023-11-14 NOTE — ED Notes (Signed)
Per lab, C.diff panel will be sent to [facility] for indeterminate results.

## 2023-11-14 NOTE — Evaluation (Signed)
Physical Therapy Evaluation Patient Details Name: LEVETA DEANGELO MRN: [id number] DOB: Apr 11, 1946 Today's Date: 11/14/2023  History of Present Illness  Sania Afable  is a 77 y.o. female, 77 year old female with a history of Stage IV (T1c, N3, M1 C) non-small cell lung cancer diagnosed Jan 2024, hypertension, hyperlipidemia, hypothyroidism, depression/anxiety GERD, diagnosis of lymphocytic colitis s/p colonoscopy in july this year.  Patient has been Entercote which was discontinued 10/28/2023 as her lymphocytic colitis has been in remission with no further diarrhea, as well patient has not been taking her oral chemo Caryn Section for a few weeks as she could not tolerate side effect.  -Presents to ED today secondary to complaints of diarrhea, and weakness, patient reports ongoing diarrhea for the last 10 days, roughly 3-4 episodes per day, watery, nonbloody, denies fever, chills, no vomiting, she does report some nausea and abdominal discomfort, she was instructed by PCP to come to ED.   Clinical Impression  Patient demonstrates slightly labored movement for sitting up at bedside requiring increased time for scooting to EOB, good return for completing sit to stands, transfers and ambulating in room/hallway using  RW and tolerated sitting up in chair after therapy.  Patient will benefit from continued skilled physical therapy in hospital and recommended venue below to increase strength, balance, endurance for safe ADLs and gait.         If plan is discharge home, recommend the following: A little help with walking and/or transfers;A little help with bathing/dressing/bathroom;Help with stairs or ramp for entrance;Assistance with cooking/housework   Can travel by private vehicle        Equipment Recommendations None recommended by PT  Recommendations for Other Services       Functional Status Assessment Patient has had a recent decline in their functional status and demonstrates the ability to make  significant improvements in function in a reasonable and predictable amount of time.     Precautions / Restrictions Precautions Precautions: Fall Restrictions Weight Bearing Restrictions: No      Mobility  Bed Mobility Overal bed mobility: Needs Assistance Bed Mobility: Supine to Sit     Supine to sit: Supervision     General bed mobility comments: slightly labored movement reqiring increased time for scooting forward    Transfers Overall transfer level: Needs assistance Equipment used: Rolling walker (2 wheels) Transfers: Sit to/from Stand, Bed to chair/wheelchair/BSC Sit to Stand: Contact guard assist   Step pivot transfers: Contact guard assist       General transfer comment: slightly labored movement without loss of balance    Ambulation/Gait Ambulation/Gait assistance: Supervision, Contact guard assist Gait Distance (Feet): 55 Feet Assistive device: Rolling walker (2 wheels) Gait Pattern/deviations: Decreased step length - right, Decreased step length - left, Decreased stride length Gait velocity: decreased     General Gait Details: slightly labored cadence with fair/good return for ambulating in room/hallway using RW without loss of balance, required increased time for making turns  Careers information officer     Tilt Bed    Modified Rankin (Stroke Patients Only)       Balance Overall balance assessment: Needs assistance Sitting-balance support: Feet supported, No upper extremity supported Sitting balance-Leahy Scale: Fair Sitting balance - Comments: fair/good seated at EOB   Standing balance support: During functional activity, Bilateral upper extremity supported Standing balance-Leahy Scale: Fair Standing balance comment: fair/good using RW  Pertinent Vitals/Pain Pain Assessment Pain Assessment: No/denies pain    Home Living Family/patient expects to be discharged to:: Private  residence Living Arrangements: Spouse/significant other Available Help at Discharge: Family;Available 24 hours/day Type of Home: House Home Access: Stairs to enter Entrance Stairs-Rails: Right;Left;Can reach both Entrance Stairs-Number of Steps: 4   Home Layout: One level Home Equipment: Rollator (4 wheels)      Prior Function Prior Level of Function : Independent/Modified Independent             Mobility Comments: household and short distanced community ambulator using RW ADLs Comments: Assisted by family     Extremity/Trunk Assessment   Upper Extremity Assessment Upper Extremity Assessment: Overall WFL for tasks assessed    Lower Extremity Assessment Lower Extremity Assessment: Generalized weakness    Cervical / Trunk Assessment Cervical / Trunk Assessment: Normal  Communication   Communication Communication: No apparent difficulties  Cognition Arousal: Alert Behavior During Therapy: WFL for tasks assessed/performed Overall Cognitive Status: Within Functional Limits for tasks assessed                                          General Comments      Exercises     Assessment/Plan    PT Assessment Patient needs continued PT services  PT Problem List Decreased strength;Decreased activity tolerance;Decreased balance;Decreased mobility       PT Treatment Interventions DME instruction;Gait training;Stair training;Functional mobility training;Therapeutic activities;Therapeutic exercise;Balance training;Patient/family education    PT Goals (Current goals can be found in the Care Plan section)  Acute Rehab PT Goals Patient Stated Goal: return home with family to assist PT Goal Formulation: With patient Time For Goal Achievement: 11/17/23 Potential to Achieve Goals: Good    Frequency Min 3X/week     Co-evaluation               AM-PAC PT "6 Clicks" Mobility  Outcome Measure Help needed turning from your back to your side while in a  flat bed without using bedrails?: None Help needed moving from lying on your back to sitting on the side of a flat bed without using bedrails?: A Little Help needed moving to and from a bed to a chair (including a wheelchair)?: A Little Help needed standing up from a chair using your arms (e.g., wheelchair or bedside chair)?: A Little Help needed to walk in hospital room?: A Little Help needed climbing 3-5 steps with a railing? : A Lot 6 Click Score: 18    End of Session   Activity Tolerance: Patient tolerated treatment well;Patient limited by fatigue Patient left: in chair;with call bell/phone within reach Nurse Communication: Mobility status PT Visit Diagnosis: Unsteadiness on feet (R26.81);Other abnormalities of gait and mobility (R26.89);Muscle weakness (generalized) (M62.81)    Time: 0900-0920 PT Time Calculation (min) (ACUTE ONLY): 20 min   Charges:   PT Evaluation $PT Eval Moderate Complexity: 1 Mod PT Treatments $Therapeutic Activity: 8-22 mins PT General Charges $$ ACUTE PT VISIT: 1 Visit         12:25 PM, 11/14/23 Ocie Bob, MPT Physical Therapist with [facility] [phone] office [phone] mobile phone

## 2023-11-14 NOTE — Plan of Care (Signed)
  Problem: Acute Rehab PT Goals(only PT should resolve) Goal: Pt Will Go Supine/Side To Sit Outcome: Progressing Flowsheets (Taken 11/14/2023 1226) Pt will go Supine/Side to Sit: with modified independence Goal: Patient Will Transfer Sit To/From Stand Outcome: Progressing Flowsheets (Taken 11/14/2023 1226) Patient will transfer sit to/from stand:  with modified independence  with supervision Goal: Pt Will Transfer Bed To Chair/Chair To Bed Outcome: Progressing Flowsheets (Taken 11/14/2023 1226) Pt will Transfer Bed to Chair/Chair to Bed:  with modified independence  with supervision Goal: Pt Will Ambulate Outcome: Progressing Flowsheets (Taken 11/14/2023 1226) Pt will Ambulate:  75 feet  with supervision  with modified independence  with rolling walker   12:27 PM, 11/14/23 Ocie Bob, MPT Physical Therapist with [facility] [phone] office [phone] mobile phone

## 2023-11-14 NOTE — Consult Note (Signed)
Gastroenterology Consult   Referring Provider: No ref. provider found Primary Care Physician:  [name], MD Primary Gastroenterologist:  Gerrit Friends.Rourk, MD  Patient ID: Patricia [name]; [id number]; 11-10-1946   Admit date: 11/13/2023  LOS: 0 days   Date of Consultation: 11/14/2023  Reason for Consultation: Pancolitis, history lymphocytic colitis with diarrhea  History of Present Illness   Patricia [name] is a 77 y.o. year old female with history of stage IV none small cell lung cancer diagnosed in January 2024, HTN, HLD, hypothyroidism, depression/anxiety, GERD, and lymphocytic colitis.  She presented with over 1 week of nonbloody diarrhea and weakness.  GI consulted given concerns of recurrent lymphocytic colitis given multiple episodes of diarrhea.  EGD October 2022: Normal s/p dilation.  Colonoscopy April 2018: Normal.  No repeat due to age, only perform if new symptoms develop.  History of hospitalization in July due to persistent diarrhea as well as associated nausea vomiting and poor p.o. intake.  At that time she was found to have 10 or more bowel movements a day and food running right through her.  She was also experiencing abdominal cramping throughout and denied any BRBPR or melena.  She has CT findings compatible with colitis from splenic flexure to the level of the rectum.  She was noted to be started on levothyroxine a month prior due to elevated TSH.  She underwent inpatient colonoscopy which revealed friability with contact bleeding in the ascending colon and cecum which were biopsied and pathology was consistent with lymphocytic colitis.  Stool testing was negative at that time.  Recently seen outpatient 10/28/2023 and she had just recently finished a course of Entocort (possibly around 11/8) and her diarrhea had resolved.  She was very happy with her bowel habits.  GERD controlled on lansoprazole.  She was advised to call immediately if she were to have recurrent  diarrhea.  Recently admitted to [facility] in early November due to 1 week of generalized weakness and decreased p.o. intake.  She had recently stopped taking one of her chemo drugs and her stools have began to slow down.  She reported 2-3 bowel movements daily.  She continued to have nausea despite premedicating with Zofran.  She was also found to have elevated LFTs.  Ultrasound with increased hepatic itchiness today without cholecystitis.  LFTs began trending down.  Viral hepatitis panel negative.  She was discharged home with prednisone 50 mg daily for 2 weeks.  In the ED this admission she had CT abdomen pelvis that revealed pancolitis. Labs - afebrile, no leukocytosis, mild hypokalemia.  Normal magnesium and lipase.  UA positive for ketones.   Today: Reports to me that her symptoms began on Thanksgiving.  She has had greater than 5-6 bowel movements daily since then.  Food has been running through her.  Stools have been nonbloody in nature.  Has generalized abdominal cramping throughout.  Cramping does not necessarily improved with defecation.  Recently has not had any nausea or vomiting.  She denies any outpatient antibiotics or recent sick contacts.  She reported when she saw Dr. Jena Gauss last week she was having more normal bowel movements and was feeling much improved overall.  She has continued on levothyroxine.    Past Medical History:  Diagnosis Date   Depression    GERD (gastroesophageal reflux disease)    Hypercholesterolemia    IFG (impaired fasting glucose)    Nonintractable episodic headache 01/18/2023   Osteopenia    PONV (postoperative nausea and vomiting)  Stroke [facility])     Past Surgical History:  Procedure Laterality Date   ABDOMINAL HYSTERECTOMY     BIOPSY  06/24/2023   Procedure: BIOPSY;  Surgeon: Dolores Frame, MD;  Location: [facility];  Service: Gastroenterology;;   CATARACT EXTRACTION W/PHACO  09/12/2011   Procedure: CATARACT EXTRACTION PHACO AND  INTRAOCULAR LENS PLACEMENT (IOC);  Surgeon: Gemma Payor;  Location: [facility] ORS;  Service: Ophthalmology;  Laterality: Left;  CDE: 8.91   CATARACT EXTRACTION W/PHACO  11/18/2011   Procedure: CATARACT EXTRACTION PHACO AND INTRAOCULAR LENS PLACEMENT (IOC);  Surgeon: Gemma Payor;  Location: [facility] ORS;  Service: Ophthalmology;  Laterality: Right;  CDE:10.26   COLONOSCOPY  2007   Dr. Jena Gauss: internal hemorrhoids, single anal papilla poor prep.    COLONOSCOPY N/A 03/27/2017   Rourk: Normal exam.   COLONOSCOPY  06/24/2023   Procedure: COLONOSCOPY;  Surgeon: Marguerita Merles, Reuel Boom, MD;  Location: [facility];  Service: Gastroenterology;;   ECTOPIC PREGNANCY SURGERY     removal of right tube   ESOPHAGOGASTRODUODENOSCOPY (EGD) WITH PROPOFOL N/A 09/10/2021   Procedure: ESOPHAGOGASTRODUODENOSCOPY (EGD) WITH PROPOFOL;  Surgeon: Corbin Ade, MD;  Location: [facility];  Service: Endoscopy;  Laterality: N/A;  8:15AM   MALONEY DILATION N/A 09/10/2021   Procedure: Elease Hashimoto DILATION;  Surgeon: Corbin Ade, MD;  Location: [facility];  Service: Endoscopy;  Laterality: N/A;   OVARIAN CYST REMOVAL     right   RECTOCELE REPAIR N/A 01/18/2014   Procedure: POSTERIOR REPAIR (RECTOCELE);  Surgeon: Tilda Burrow, MD;  Location: [facility] ORS;  Service: Gynecology;  Laterality: N/A;   VAGINAL HYSTERECTOMY N/A 01/18/2014   Procedure: HYSTERECTOMY VAGINAL;  Surgeon: Tilda Burrow, MD;  Location: [facility];  Service: Gynecology;  Laterality: N/A;    Prior to Admission medications   Medication Sig Start Date End Date Taking? Authorizing Provider  acetaminophen (TYLENOL) 500 MG tablet Take 1,000 mg by mouth every 6 (six) hours as needed for mild pain (pain score 1-3).   Yes [provider]  ALPRAZolam (XANAX) 0.5 MG tablet Take 1 tablet (0.5 mg total) by mouth at bedtime as needed for anxiety or sleep. TAKE (1) TABLET BY MOUTH AT BEDTIME AS NEEDED FOR SLEEP. Patient taking differently: Take 0.5 mg by mouth at bedtime  as needed for sleep. TAKE (1) TABLET BY MOUTH AT BEDTIME AS NEEDED FOR SLEEP. 09/18/23  Yes Campbell Riches, NP  apixaban (ELIQUIS) 5 MG TABS tablet Take 1 tablet (5 mg total) by mouth 2 (two) times daily. 10/15/23  Yes Johnson, Clanford L, MD  famotidine (PEPCID) 40 MG tablet Take 1 tablet (40 mg total) by mouth daily. 07/10/23  Yes Luking, Jonna Coup, MD  KLOR-CON M20 20 MEQ tablet TAKE 1 TABLET BY MOUTH THREE TIMES A DAY 11/10/23  Yes Luking, Scott A, MD  lansoprazole (PREVACID) 30 MG capsule Take 30 mg by mouth daily. 08/06/23  Yes [provider]  levothyroxine (SYNTHROID) 50 MCG tablet TAKE 1 TABLET BY MOUTH DAILY BEFORE BREAKFAST 06/06/23  Yes Si Gaul, MD  loperamide (IMODIUM) 2 MG capsule Take 2 mg by mouth as needed for diarrhea or loose stools.   Yes [provider]  ondansetron (ZOFRAN) 8 MG tablet Take 0.5 tablets (4 mg total) by mouth every 8 (eight) hours as needed for nausea or vomiting. 09/29/23  Yes Si Gaul, MD  topiramate (TOPAMAX) 50 MG tablet TAKE 1 TABLET BY MOUTH TWICE A DAY 11/12/23  Yes Vaslow, Georgeanna Lea, MD  Current Facility-Administered Medications  Medication Dose Route Frequency Provider Last Rate Last Admin   acetaminophen (TYLENOL) tablet 650 mg  650 mg Oral Q6H PRN Elgergawy, Leana Roe, MD       Or   acetaminophen (TYLENOL) suppository 650 mg  650 mg Rectal Q6H PRN Elgergawy, Leana Roe, MD       ALPRAZolam Prudy Feeler) tablet 0.5 mg  0.5 mg Oral QHS PRN Elgergawy, Leana Roe, MD       apixaban (ELIQUIS) tablet 5 mg  5 mg Oral BID Elgergawy, Leana Roe, MD   5 mg at 11/13/23 2306   cefTRIAXone (ROCEPHIN) 2 g in sodium chloride 0.9 % 100 mL IVPB  2 g Intravenous Q24H Elgergawy, Leana Roe, MD       lactated ringers infusion   Intravenous Continuous Elgergawy, Leana Roe, MD 50 mL/hr at 11/13/23 2319 New Bag at 11/13/23 2319   levothyroxine (SYNTHROID) tablet 50 mcg  50 mcg Oral Q0600 Elgergawy, Leana Roe, MD   50 mcg at 11/14/23 1610   metroNIDAZOLE  (FLAGYL) IVPB 500 mg  500 mg Intravenous Q12H Elgergawy, Leana Roe, MD       pantoprazole (PROTONIX) EC tablet 40 mg  40 mg Oral Daily Elgergawy, Leana Roe, MD   40 mg at 11/13/23 2306   potassium chloride SA (KLOR-CON M) CR tablet 20 mEq  20 mEq Oral TID Elgergawy, Leana Roe, MD       sertraline (ZOLOFT) tablet 25 mg  25 mg Oral Daily Elgergawy, Leana Roe, MD   25 mg at 11/13/23 2306   topiramate (TOPAMAX) tablet 50 mg  50 mg Oral BID Elgergawy, Leana Roe, MD   50 mg at 11/13/23 2306    Allergies as of 11/13/2023 - Review Complete 11/13/2023  Allergen Reaction Noted   Codeine Nausea Only 09/11/2011    Family History  Problem Relation Age of Onset   Osteoporosis Mother    Heart attack Father    Heart attack Brother 36       MI   Breast cancer Maternal Aunt    Breast cancer Paternal Aunt    Cancer Paternal Aunt        ovarian cancer   Anesthesia problems Neg Hx    Hypotension Neg Hx    Malignant hyperthermia Neg Hx    Pseudochol deficiency Neg Hx    Colon cancer Neg Hx    Inflammatory bowel disease Neg Hx     Social History   Socioeconomic History   Marital status: Married    Spouse name: Not on file   Number of children: Not on file   Years of education: Not on file   Highest education level: Not on file  Occupational History   Not on file  Tobacco Use   Smoking status: Former    Current packs/day: 0.00    Average packs/day: 1 pack/day for 20.0 years (20.0 ttl pk-yrs)    Types: Cigarettes    Start date: 09/10/1964    Quit date: 09/10/1984    Years since quitting: 39.2   Smokeless tobacco: Never  Vaping Use   Vaping status: Never Used  Substance and Sexual Activity   Alcohol use: No   Drug use: No   Sexual activity: Not Currently    Birth control/protection: Post-menopausal  Other Topics Concern   Not on file  Social History Narrative   Not on file   Social Determinants of Health   Financial Resource Strain: Not on file  Food Insecurity: No Food Insecurity  (  11/14/2023)   Hunger Vital Sign    Worried About Running Out of Food in the Last Year: Never true    Ran Out of Food in the Last Year: Never true  Transportation Needs: No Transportation Needs (11/14/2023)   PRAPARE - Administrator, Civil Service (Medical): No    Lack of Transportation (Non-Medical): No  Physical Activity: Not on file  Stress: Not on file  Social Connections: Unknown (04/20/2022)   Received from [facility]   Social Network    Social Network: Not on file  Intimate Partner Violence: Not At Risk (11/14/2023)   Humiliation, Afraid, Rape, and Kick questionnaire    Fear of Current or Ex-Partner: No    Emotionally Abused: No    Physically Abused: No    Sexually Abused: No     Review of Systems   Gen: Denies any fever, chills, loss of appetite, change in weight or weight loss CV: Denies chest pain, heart palpitations, syncope, edema  Resp: Denies shortness of breath with rest, cough, wheezing, coughing up blood, and pleurisy. GI: Denies vomiting blood, jaundice, and fecal incontinence.   Denies dysphagia or odynophagia. GU : Denies urinary burning, blood in urine, urinary frequency, and urinary incontinence. MS: Denies joint pain, limitation of movement, swelling, cramps, and atrophy.  Derm: Denies rash, itching, dry skin, hives. Psych: Denies depression, anxiety, memory loss, hallucinations, and confusion. Heme: Denies bruising or bleeding Neuro:  Denies any headaches, dizziness, paresthesias, shaking  Physical Exam   Vital Signs in last 24 hours: Temp:  [97.8 F (36.6 C)-99.9 F (37.7 C)] 97.9 F (36.6 C) (12/06 0021) Pulse Rate:  [64-93] 66 (12/06 0445) Resp:  [14-19] 16 (12/06 0445) BP: (94-125)/(55-82) 103/62 (12/06 0445) SpO2:  [93 %-100 %] 97 % (12/06 0445) Weight:  [45.4 kg] 45.4 kg (12/05 1136) Last BM Date : 11/14/23  General:   Alert,  pleasant and cooperative in NAD, frail, ill appearing.  Head:  Normocephalic and  atraumatic. Eyes:  Sclera clear, no icterus.   Conjunctiva pink. Ears:  Normal auditory acuity. Abdomen:  Soft, nontender and nondistended. No masses, hepatosplenomegaly or hernias noted. Normal bowel sounds, without guarding, and without rebound.   Rectal: deferred   Extremities:  Without clubbing or edema. Neurologic:  Alert and  oriented x4. Skin:  Intact without significant lesions or rashes. Psych:  Alert and cooperative. Normal mood and affect.  Intake/Output from previous day: 12/05 0701 - 12/06 0700 In: 438.1 [P.O.:240; IV Piggyback:198.1] Out: -  Intake/Output this shift: No intake/output data recorded.  Labs/Studies   Recent Labs Recent Labs    11/13/23 1152 11/14/23 0439  WBC 4.8 4.2  HGB 14.0 11.4*  HCT 43.7 37.3  PLT 240 195   BMET Recent Labs    11/13/23 1152 11/14/23 0439  NA 139 139  K 3.4* 3.1*  CL 110 112*  CO2 19* 18*  GLUCOSE 100* 98  BUN 22 16  CREATININE 0.76 0.55  CALCIUM 9.6 8.7*   LFT Recent Labs    11/13/23 1152  PROT 6.0*  ALBUMIN 3.3*  AST 19  ALT 24  ALKPHOS 107  BILITOT 0.9   PT/INR No results for input(s): "LABPROT", "INR" in the last 72 hours. Hepatitis Panel No results for input(s): "HEPBSAG", "HCVAB", "HEPAIGM", "HEPBIGM" in the last 72 hours. C-Diff Recent Labs    11/13/23 2131  CDIFFTOX NEGATIVE    Radiology/Studies CT ABDOMEN PELVIS W CONTRAST  Result Date: 11/13/2023 CLINICAL DATA:  Left lower quadrant  abdominal pain. EXAM: CT ABDOMEN AND PELVIS WITH CONTRAST TECHNIQUE: Multidetector CT imaging of the abdomen and pelvis was performed using the standard protocol following bolus administration of intravenous contrast. RADIATION DOSE REDUCTION: This exam was performed according to the departmental dose-optimization program which includes automated exposure control, adjustment of the mA and/or kV according to patient size and/or use of iterative reconstruction technique. CONTRAST:  80mL OMNIPAQUE IOHEXOL 350 MG/ML  SOLN COMPARISON:  CT dated 09/15/2023. FINDINGS: Lower chest: The visualized lung bases are clear. No intra-abdominal free air or free fluid. Hepatobiliary: The liver is unremarkable. No biliary dilatation. The gallbladder is physiologically distended. No calcified gallstone or pericholecystic fluid Pancreas: Unremarkable. No pancreatic ductal dilatation or surrounding inflammatory changes. Spleen: Normal in size without focal abnormality. Adrenals/Urinary Tract: The adrenal glands unremarkable. There is no hydronephrosis on either side. There is symmetric enhancement and excretion of contrast by both kidneys. The visualized ureters and urinary bladder appear unremarkable. Small left posterior bladder diverticulum. Stomach/Bowel: There is diffuse mucosal enhancement and mild inflammatory changes of the: Consistent with pancolitis. Findings may be infectious in etiology or secondary to inflammatory bowel disease such as ulcerative colitis. Clinical correlation recommended. Loose stool throughout the colon suggestive of diarrheal state. Correlation with clinical exam and stool cultures recommended. There is a moderate size hiatal hernia. There is no bowel obstruction. The appendix is normal. Vascular/Lymphatic: Mild aortoiliac atherosclerotic disease. The IVC is unremarkable. No portal venous gas. There is no adenopathy. Reproductive: Hysterectomy.  No suspicious adnexal masses. Other: None Musculoskeletal: Osteopenia with degenerative changes. No acute osseous pathology. IMPRESSION: 1. Pancolitis and diarrheal state. Correlation with clinical exam and stool cultures recommended. No bowel obstruction. Normal appendix. 2. Moderate size hiatal hernia. 3.  Aortic Atherosclerosis (ICD10-I70.0). Electronically Signed   By: Elgie Collard M.D.   On: 11/13/2023 20:55     Assessment   Patricia [name] is a 77 y.o. year old female with history of stage IV none small cell lung cancer diagnosed in January 2024, HTN, HLD,  hypothyroidism, depression/anxiety, GERD, and lymphocytic colitis.  She presented with over 1 week of nonbloody diarrhea and weakness.  GI consulted given concerns of recurrent lymphocytic colitis given multiple episodes of diarrhea.  Pancolitis, history of lymphocytic colitis: Presentation this admission similar to her prior admission in July with diarrhea and colitis.  Earlier this year in July however she only had colitis from the splenic flexure to the rectum, currently has pancolitis.  Prior stool studies have been negative however this admission GI path is pending. Colonoscopy in July with pathology consistent with lymphocytic colitis.  She has been treated with Entocort and finished her course around 11/8.  She had resumption of diarrhea on Thanksgiving 11/28 and reports her food continues to run through her.   Cdiff PCR is positive.  Will not treat with budesonide.  Would prefer treatment with the daptomycin, if unable to obtain this, she should start vancomycin 125 mg 4 times daily  Plan / Recommendations   Stop flagyl and rocephin Start fidaxomicin 200 mg BID for 10 days, may need to extend to 14 days Can switch to vancomycin 125mg  QID if unable to get fidaxomicin Supportive measures Budesonide contraindicated in setting of cdiff    11/14/2023, 8:45 AM  Brooke Bonito, MSN, FNP-BC, AGACNP-BC [facility]

## 2023-11-15 DIAGNOSIS — A048 Other specified bacterial intestinal infections: Secondary | ICD-10-CM | POA: Diagnosis not present

## 2023-11-15 DIAGNOSIS — A0472 Enterocolitis due to Clostridium difficile, not specified as recurrent: Secondary | ICD-10-CM | POA: Diagnosis not present

## 2023-11-15 DIAGNOSIS — R197 Diarrhea, unspecified: Secondary | ICD-10-CM | POA: Diagnosis not present

## 2023-11-15 DIAGNOSIS — I6340 Cerebral infarction due to embolism of unspecified cerebral artery: Secondary | ICD-10-CM | POA: Diagnosis not present

## 2023-11-15 DIAGNOSIS — B9681 Helicobacter pylori [H. pylori] as the cause of diseases classified elsewhere: Secondary | ICD-10-CM | POA: Diagnosis not present

## 2023-11-15 DIAGNOSIS — K5100 Ulcerative (chronic) pancolitis without complications: Secondary | ICD-10-CM | POA: Diagnosis not present

## 2023-11-15 DIAGNOSIS — A09 Infectious gastroenteritis and colitis, unspecified: Secondary | ICD-10-CM

## 2023-11-15 LAB — BASIC METABOLIC PANEL
Anion gap: 6 (ref 5–15)
BUN: 11 mg/dL (ref 8–23)
CO2: 19 mmol/L — ABNORMAL LOW (ref 22–32)
Calcium: 8.8 mg/dL — ABNORMAL LOW (ref 8.9–10.3)
Chloride: 114 mmol/L — ABNORMAL HIGH (ref 98–111)
Creatinine, Ser: 0.55 mg/dL (ref 0.44–1.00)
GFR, Estimated: >60 mL/min (ref >60)
Glucose, Bld: 98 mg/dL (ref 70–99)
Potassium: 3.5 mmol/L (ref 3.5–5.1)
Sodium: 139 mmol/L (ref 135–145)

## 2023-11-15 LAB — GASTROINTESTINAL PANEL BY PCR, STOOL (REPLACES STOOL CULTURE)

## 2023-11-15 LAB — MAGNESIUM: Magnesium: 1.8 mg/dL (ref 1.7–2.4)

## 2023-11-15 MED ORDER — MAGNESIUM SULFATE 2 GM/50ML IV SOLN
2.0000 g | Freq: Once | INTRAVENOUS | Status: AC
  Administered 2023-11-15: 2 g via INTRAVENOUS
  Filled 2023-11-15: qty 50

## 2023-11-15 MED ORDER — VANCOMYCIN HCL 125 MG PO CAPS: 125.0000 mg | ORAL_CAPSULE | Freq: Four times a day (QID) | ORAL | 0 refills | Status: AC

## 2023-11-15 NOTE — Discharge Instructions (Signed)
IMPORTANT INFORMATION: PAY CLOSE ATTENTION   PHYSICIAN DISCHARGE INSTRUCTIONS  Follow with Primary care provider  Luking, Scott A, MD  and other consultants as instructed by your Hospitalist Physician  SEEK MEDICAL CARE OR RETURN TO EMERGENCY ROOM IF SYMPTOMS COME BACK, WORSEN OR NEW PROBLEM DEVELOPS   Please note: You were cared for by a hospitalist during your hospital stay. Every effort will be made to forward records to your primary care provider.  You can request that your primary care provider send for your hospital records if they have not received them.  Once you are discharged, your primary care physician will handle any further medical issues. Please note that NO REFILLS for any discharge medications will be authorized once you are discharged, as it is imperative that you return to your primary care physician (or establish a relationship with a primary care physician if you do not have one) for your post hospital discharge needs so that they can reassess your need for medications and monitor your lab values.  Please get a complete blood count and chemistry panel checked by your Primary MD at your next visit, and again as instructed by your Primary MD.  Get Medicines reviewed and adjusted: Please take all your medications with you for your next visit with your Primary MD  Laboratory/radiological data: Please request your Primary MD to go over all hospital tests and procedure/radiological results at the follow up, please ask your primary care provider to get all Hospital records sent to his/her office.  In some cases, they will be blood work, cultures and biopsy results pending at the time of your discharge. Please request that your primary care provider follow up on these results.  If you are diabetic, please bring your blood sugar readings with you to your follow up appointment with primary care.    Please call and make your follow up appointments as soon as possible.    Also Note  the following: If you experience worsening of your admission symptoms, develop shortness of breath, life threatening emergency, suicidal or homicidal thoughts you must seek medical attention immediately by calling 911 or calling your MD immediately  if symptoms less severe.  You must read complete instructions/literature along with all the possible adverse reactions/side effects for all the Medicines you take and that have been prescribed to you. Take any new Medicines after you have completely understood and accpet all the possible adverse reactions/side effects.   Do not drive when taking Pain medications or sleeping medications (Benzodiazepines)  Do not take more than prescribed Pain, Sleep and Anxiety Medications. It is not advisable to combine anxiety,sleep and pain medications without talking with your primary care practitioner  Special Instructions: If you have smoked or chewed Tobacco  in the last 2 yrs please stop smoking, stop any regular Alcohol  and or any Recreational drug use.  Wear Seat belts while driving.  Do not drive if taking any narcotic, mind altering or controlled substances or recreational drugs or alcohol.       

## 2023-11-15 NOTE — TOC Progression Note (Signed)
Transition of Care [facility]) - Progression Note    Patient Details  Name: Patricia [name] MRN: [id number] Date of Birth: 12-21-1945  [facility] CM/SW Contact  Catalina Gravel, LCSW Phone Number: 11/15/2023, 10:01 AM  Clinical Narrative:    Pt DC today, showed as high risk readmission, CSW reviewed. HHPT orders entered and pt accepted by [facility].  CSW communicated with provider of DC. No further TOC needs.    Expected Discharge Plan: Home w Home Health Services Barriers to Discharge: No Barriers Identified  Expected Discharge Plan and Services       Living arrangements for the past 2 months: Single Family Home Expected Discharge Date: 11/15/23                             Date HH Agency Contacted: 11/14/23 Time HH Agency Contacted: 1218 Representative spoke with at [facility] Agency: Maralyn Sago   Social Determinants of Health (SDOH) Interventions SDOH Screenings   Food Insecurity: No Food Insecurity (11/14/2023)  Housing: Low Risk  (11/14/2023)  Transportation Needs: No Transportation Needs (11/14/2023)  Utilities: Not At Risk (11/14/2023)  Depression (PHQ2-9): High Risk (10/22/2023)  Social Connections: Unknown (04/20/2022)   Received from [facility]  Tobacco Use: Medium Risk (11/13/2023)    Readmission Risk Interventions    11/15/2023    9:58 AM 10/15/2023   12:03 PM 06/23/2023    9:18 AM  Readmission Risk Prevention Plan  Transportation Screening Complete Complete Complete  PCP or Specialist Appt within 3-5 Days Complete    HRI or Home Care Consult Complete    Social Work Consult for Recovery Care Planning/Counseling Complete    Palliative Care Screening Not Applicable    Medication Review [employer]) Complete Complete Complete  HRI or Home Care Consult  Complete Complete  SW Recovery Care/Counseling Consult  Complete Complete  Palliative Care Screening  Not Applicable Not Applicable  Skilled Nursing Facility  Not Applicable Not  Applicable

## 2023-11-15 NOTE — Progress Notes (Signed)
Discharge instructions given patient verbalized understanding. IV removed.

## 2023-11-15 NOTE — Discharge Summary (Signed)
Physician Discharge Summary  Patricia [name] KZS:[id number] DOB: [birth year] DOA: 11/13/2023  PCP: [name], MD GI:  [facility]  Admit date: 11/13/2023 Discharge date: 11/15/2023  Admitted From:  HOME  Disposition: Home with [name]   Recommendations for Outpatient Follow-up:  Follow up with PCP in 2 weeks Follow up with [facility] on 11/26/23 as scheduled Please complete full 10 day course of oral vancomycin   Home Health:  PT/OT   Discharge Condition: STABLE   CODE STATUS: DNR DIET: resume prior home diet as tolerated   Brief Hospitalization Summary: Please see all hospital notes, images, labs for full details of the hospitalization. Admission provider HPI:  77 year old female with a history of Stage IV (T1c, N3, M1 C) non-small cell lung cancer diagnosed Jan 2024, hypertension, hyperlipidemia, hypothyroidism, depression/anxiety GERD, diagnosis of lymphocytic colitis s/p colonoscopy in july this year.  Patient has been Entercote which was discontinued 10/28/2023 as her lymphocytic colitis has been in remission with no further diarrhea, as well patient has not been taking her oral chemo Caryn Section for a few weeks as she could not tolerate side effect. -Presents to ED today secondary to complaints of diarrhea, and weakness, patient reports ongoing diarrhea for the last 10 days, roughly 3-4 episodes per day, watery, nonbloody, denies fever, chills, no vomiting, she does report some nausea and abdominal discomfort, she was instructed by PCP to come to ED.  In ED CT abdomen pelvis significant for pancolitis, she is afebrile, no leukocytosis, workup significant for low potassium at 3.4, she was started on IV Rocephin, and Flagyl, but given her frailty, significant diarrhea, [facility] consulted to admit  HOSPITAL COURSE BY PROBLEM   C difficile colitis  - initially started on fidaxomicin but the cost is too high per benefits check of her insurance.   - GI started on oral vancomycin  which has a reasonable cost per benefits check - this is cause of her diarrhea and should improve with treatment - continue supportive care for now - she is feeling better, diarrhea slowed down  - pt tolerating diet - pt prescribed to complete full 10 day course of oral vancomycin capsules - pt has outpatient follow up with [facility] on 11/26/23.    Adult FTT - PT eval requested and home health orders placed    Hypokalemia - check Mg and IV replacement given - replacement ordered and resume home oral potassium supplement   Anxiety with depression  - resumed home sertraline and alprazolam   Stage IV NSC lung cancer - followed by [facility]   History of embolic CVA - apixaban for stroke prophylaxis since Feb 2024    Discharge Diagnoses:  Principal Problem:   Pancolitis (HCC) Active Problems:   GERD without esophagitis   Anxiety and depression   Non-small cell lung cancer, left (HCC)   Acute CVA (cerebrovascular accident) (HCC)   Embolic stroke (HCC)   DVT of lower extremity, bilateral (HCC)   Acquired hypothyroidism   Diarrhea   C. difficile colitis   Discharge Instructions:  Allergies as of 11/15/2023       Reactions   Codeine Nausea Only        Medication List     TAKE these medications    acetaminophen 500 MG tablet Commonly known as: TYLENOL Take 1,000 mg by mouth every 6 (six) hours as needed for mild pain (pain score 1-3).   ALPRAZolam 0.5 MG tablet Commonly known as: XANAX Take 1 tablet (0.5 mg total) by mouth  at bedtime as needed for anxiety or sleep. TAKE (1) TABLET BY MOUTH AT BEDTIME AS NEEDED FOR SLEEP. What changed: reasons to take this   apixaban 5 MG Tabs tablet Commonly known as: ELIQUIS Take 1 tablet (5 mg total) by mouth 2 (two) times daily.   famotidine 40 MG tablet Commonly known as: Pepcid Take 1 tablet (40 mg total) by mouth daily.   Klor-Con M20 20 MEQ tablet Generic drug: potassium chloride SA TAKE 1 TABLET BY MOUTH THREE  TIMES A DAY   lansoprazole 30 MG capsule Commonly known as: PREVACID Take 30 mg by mouth daily.   levothyroxine 50 MCG tablet Commonly known as: SYNTHROID TAKE 1 TABLET BY MOUTH DAILY BEFORE BREAKFAST   loperamide 2 MG capsule Commonly known as: IMODIUM Take 2 mg by mouth as needed for diarrhea or loose stools.   ondansetron 8 MG tablet Commonly known as: ZOFRAN Take 0.5 tablets (4 mg total) by mouth every 8 (eight) hours as needed for nausea or vomiting.   topiramate 50 MG tablet Commonly known as: TOPAMAX TAKE 1 TABLET BY MOUTH TWICE A DAY   vancomycin 125 MG capsule Commonly known as: VANCOCIN Take 1 capsule (125 mg total) by mouth 4 (four) times daily for 10 days.        Follow-up Information     [facility] Follow up.   Why: PT will call to schedule your first home visit.        [name], MD. Schedule an appointment as soon as possible for a visit in 1 week(s).   Specialty: Family Medicine Why: Hospital Follow Up Contact information: [address] [place] [address] [phone]         [facility] at [facility]. Schedule an appointment as soon as possible for a visit in 2 week(s).   Specialty: Gastroenterology Why: Hospital Follow Up Contact information: [address] [place] [address] [phone]               Allergies  Allergen Reactions   Codeine Nausea Only   Allergies as of 11/15/2023       Reactions   Codeine Nausea Only        Medication List     TAKE these medications    acetaminophen 500 MG tablet Commonly known as: TYLENOL Take 1,000 mg by mouth every 6 (six) hours as needed for mild pain (pain score 1-3).   ALPRAZolam 0.5 MG tablet Commonly known as: XANAX Take 1 tablet (0.5 mg total) by mouth at bedtime as needed for anxiety or sleep. TAKE (1) TABLET BY MOUTH AT BEDTIME AS NEEDED FOR SLEEP. What changed: reasons to take this    apixaban 5 MG Tabs tablet Commonly known as: ELIQUIS Take 1 tablet (5 mg total) by mouth 2 (two) times daily.   famotidine 40 MG tablet Commonly known as: Pepcid Take 1 tablet (40 mg total) by mouth daily.   Klor-Con M20 20 MEQ tablet Generic drug: potassium chloride SA TAKE 1 TABLET BY MOUTH THREE TIMES A DAY   lansoprazole 30 MG capsule Commonly known as: PREVACID Take 30 mg by mouth daily.   levothyroxine 50 MCG tablet Commonly known as: SYNTHROID TAKE 1 TABLET BY MOUTH DAILY BEFORE BREAKFAST   loperamide 2 MG capsule Commonly known as: IMODIUM Take 2 mg by mouth as needed for diarrhea or loose stools.   ondansetron 8 MG tablet Commonly known as: ZOFRAN Take 0.5 tablets (4 mg total)  by mouth every 8 (eight) hours as needed for nausea or vomiting.   topiramate 50 MG tablet Commonly known as: TOPAMAX TAKE 1 TABLET BY MOUTH TWICE A DAY   vancomycin 125 MG capsule Commonly known as: VANCOCIN Take 1 capsule (125 mg total) by mouth 4 (four) times daily for 10 days.        Procedures/Studies: CT ABDOMEN PELVIS W CONTRAST  Result Date: 11/13/2023 CLINICAL DATA:  Left lower quadrant abdominal pain. EXAM: CT ABDOMEN AND PELVIS WITH CONTRAST TECHNIQUE: Multidetector CT imaging of the abdomen and pelvis was performed using the standard protocol following bolus administration of intravenous contrast. RADIATION DOSE REDUCTION: This exam was performed according to the departmental dose-optimization program which includes automated exposure control, adjustment of the mA and/or kV according to patient size and/or use of iterative reconstruction technique. CONTRAST:  80mL OMNIPAQUE IOHEXOL 350 MG/ML SOLN COMPARISON:  CT dated 09/15/2023. FINDINGS: Lower chest: The visualized lung bases are clear. No intra-abdominal free air or free fluid. Hepatobiliary: The liver is unremarkable. No biliary dilatation. The gallbladder is physiologically distended. No calcified gallstone or  pericholecystic fluid Pancreas: Unremarkable. No pancreatic ductal dilatation or surrounding inflammatory changes. Spleen: Normal in size without focal abnormality. Adrenals/Urinary Tract: The adrenal glands unremarkable. There is no hydronephrosis on either side. There is symmetric enhancement and excretion of contrast by both kidneys. The visualized ureters and urinary bladder appear unremarkable. Small left posterior bladder diverticulum. Stomach/Bowel: There is diffuse mucosal enhancement and mild inflammatory changes of the: Consistent with pancolitis. Findings may be infectious in etiology or secondary to inflammatory bowel disease such as ulcerative colitis. Clinical correlation recommended. Loose stool throughout the colon suggestive of diarrheal state. Correlation with clinical exam and stool cultures recommended. There is a moderate size hiatal hernia. There is no bowel obstruction. The appendix is normal. Vascular/Lymphatic: Mild aortoiliac atherosclerotic disease. The IVC is unremarkable. No portal venous gas. There is no adenopathy. Reproductive: Hysterectomy.  No suspicious adnexal masses. Other: None Musculoskeletal: Osteopenia with degenerative changes. No acute osseous pathology. IMPRESSION: 1. Pancolitis and diarrheal state. Correlation with clinical exam and stool cultures recommended. No bowel obstruction. Normal appendix. 2. Moderate size hiatal hernia. 3.  Aortic Atherosclerosis (ICD10-I70.0). Electronically Signed   By: Elgie Collard M.D.   On: 11/13/2023 20:55     Subjective: Pt reports diarrhea slowing down since starting oral vancomycin tabs.  She is tolerating diet.  She is eager to go home.   Discharge Exam: Vitals:   11/14/23 2252 11/15/23 0512  BP: 118/89 119/78  Pulse: 90 76  Resp: 18 14  Temp: 98.3 F (36.8 C) 98.5 F (36.9 C)  SpO2: 98% 98%   Vitals:   11/14/23 0021 11/14/23 0445 11/14/23 2252 11/15/23 0512  BP: 101/65 103/62 118/89 119/78  Pulse: 68 66 90 76   Resp: 18 16 18 14   Temp: 97.9 F (36.6 C)  98.3 F (36.8 C) 98.5 F (36.9 C)  TempSrc: Oral  Oral Oral  SpO2: 99% 97% 98% 98%  Weight:      Height:       General: Pt is alert, awake, not in acute distress Cardiovascular: normal S1/S2 +, no rubs, no gallops Respiratory: CTA bilaterally, no wheezing, no rhonchi Abdominal: Soft, NT, ND, bowel sounds + Extremities: no edema, no cyanosis   The results of significant diagnostics from this hospitalization (including imaging, microbiology, ancillary and laboratory) are listed below for reference.     Microbiology: Recent Results (from the past 240 hour(s))  Gastrointestinal Panel by  PCR , Stool     Status: None   Collection Time: 11/13/23  9:31 PM   Specimen: Stool  Result Value Ref Range Status   Campylobacter species NOT DETECTED NOT DETECTED Final   Plesimonas shigelloides NOT DETECTED NOT DETECTED Final   Salmonella species NOT DETECTED NOT DETECTED Final   Yersinia enterocolitica NOT DETECTED NOT DETECTED Final   Vibrio species NOT DETECTED NOT DETECTED Final   Vibrio cholerae NOT DETECTED NOT DETECTED Final   Enteroaggregative E coli (EAEC) NOT DETECTED NOT DETECTED Final   Enteropathogenic E coli (EPEC) NOT DETECTED NOT DETECTED Final   Enterotoxigenic E coli (ETEC) NOT DETECTED NOT DETECTED Final   Shiga like toxin producing E coli (STEC) NOT DETECTED NOT DETECTED Final   Shigella/Enteroinvasive E coli (EIEC) NOT DETECTED NOT DETECTED Final   Cryptosporidium NOT DETECTED NOT DETECTED Final   Cyclospora cayetanensis NOT DETECTED NOT DETECTED Final   Entamoeba histolytica NOT DETECTED NOT DETECTED Final   Giardia lamblia NOT DETECTED NOT DETECTED Final   Adenovirus F40/41 NOT DETECTED NOT DETECTED Final   Astrovirus NOT DETECTED NOT DETECTED Final   Norovirus GI/GII NOT DETECTED NOT DETECTED Final   Rotavirus A NOT DETECTED NOT DETECTED Final   Sapovirus (I, II, IV, and V) NOT DETECTED NOT DETECTED Final    Comment:  Performed at [facility], [address] [place], [place] [address]  C Difficile Quick Screen w PCR reflex     Status: Abnormal   Collection Time: 11/13/23  9:31 PM   Specimen: Stool  Result Value Ref Range Status   C Diff antigen POSITIVE (A) NEGATIVE Final    Comment: RESULT CALLED TO, READ BACK BY AND VERIFIED WITH: E. SHAFFER AT 2335 ON 12.05.24 BY ADGER J     C Diff toxin NEGATIVE NEGATIVE Final    Comment: RESULT CALLED TO, READ BACK BY AND VERIFIED WITH: E. SHAFFER AT 2335 ON 12.05.24 BY ADGER J     C Diff interpretation Results are indeterminate. See PCR results.  Final    Comment: RESULT CALLED TO, READ BACK BY AND VERIFIED WITH: E. SHAFFER AT 2335 ON 12.05.24 BY ADGER J  Performed at [facility], [address], [place], [place] [address]   C. Diff by PCR, Reflexed     Status: Abnormal   Collection Time: 11/13/23  9:31 PM  Result Value Ref Range Status   Toxigenic C. Difficile by PCR POSITIVE (A) NEGATIVE Final    Comment: Positive for toxigenic C. difficile with little to no toxin production. Only treat if clinical presentation suggests symptomatic illness. Performed at [facility], [address] [place], [place] [address]      Labs: BNP (last 3 results) No results for input(s): "BNP" in the last 8760 hours. Basic Metabolic Panel: Recent Labs  Lab 11/13/23 1152 11/14/23 0439 11/15/23 0430  NA 139 139 139  K 3.4* 3.1* 3.5  CL 110 112* 114*  CO2 19* 18* 19*  GLUCOSE 100* 98 98  BUN 22 16 11   CREATININE 0.76 0.55 0.55  CALCIUM 9.6 8.7* 8.8*  MG 2.0  --  1.8   Liver Function Tests: Recent Labs  Lab 11/13/23 1152  AST 19  ALT 24  ALKPHOS 107  BILITOT 0.9  PROT 6.0*  ALBUMIN 3.3*   Recent Labs  Lab 11/13/23 1152  LIPASE 22   No results for input(s): "AMMONIA" in the last 168 hours. CBC: Recent Labs  Lab 11/13/23 1152 11/14/23 0439  WBC  4.8 4.2  NEUTROABS 1.9  --   HGB 14.0 11.4*  HCT 43.7 37.3  MCV 91.8 92.8  PLT 240  195   Cardiac Enzymes: No results for input(s): "CKTOTAL", "CKMB", "CKMBINDEX", "TROPONINI" in the last 168 hours. BNP: Invalid input(s): "POCBNP" CBG: No results for input(s): "GLUCAP" in the last 168 hours. D-Dimer No results for input(s): "DDIMER" in the last 72 hours. Hgb A1c No results for input(s): "HGBA1C" in the last 72 hours. Lipid Profile No results for input(s): "CHOL", "HDL", "LDLCALC", "TRIG", "CHOLHDL", "LDLDIRECT" in the last 72 hours. Thyroid function studies No results for input(s): "TSH", "T4TOTAL", "T3FREE", "THYROIDAB" in the last 72 hours.  Invalid input(s): "FREET3" Anemia work up No results for input(s): "VITAMINB12", "FOLATE", "FERRITIN", "TIBC", "IRON", "RETICCTPCT" in the last 72 hours. Urinalysis    Component Value Date/Time   COLORURINE YELLOW 11/13/2023 2048   APPEARANCEUR CLEAR 11/13/2023 2048   LABSPEC >1.046 (H) 11/13/2023 2048   PHURINE 5.0 11/13/2023 2048   GLUCOSEU NEGATIVE 11/13/2023 2048   HGBUR NEGATIVE 11/13/2023 2048   BILIRUBINUR NEGATIVE 11/13/2023 2048   KETONESUR 5 (A) 11/13/2023 2048   PROTEINUR NEGATIVE 11/13/2023 2048   UROBILINOGEN 0.2 01/12/2014 1045   NITRITE NEGATIVE 11/13/2023 2048   LEUKOCYTESUR NEGATIVE 11/13/2023 2048   Sepsis Labs Recent Labs  Lab 11/13/23 1152 11/14/23 0439  WBC 4.8 4.2   Microbiology Recent Results (from the past 240 hour(s))  Gastrointestinal Panel by PCR , Stool     Status: None   Collection Time: 11/13/23  9:31 PM   Specimen: Stool  Result Value Ref Range Status   Campylobacter species NOT DETECTED NOT DETECTED Final   Plesimonas shigelloides NOT DETECTED NOT DETECTED Final   Salmonella species NOT DETECTED NOT DETECTED Final   Yersinia enterocolitica NOT DETECTED NOT DETECTED Final   Vibrio species NOT DETECTED NOT DETECTED Final   Vibrio cholerae NOT DETECTED NOT DETECTED Final   Enteroaggregative E coli (EAEC) NOT DETECTED NOT DETECTED Final   Enteropathogenic E coli (EPEC) NOT  DETECTED NOT DETECTED Final   Enterotoxigenic E coli (ETEC) NOT DETECTED NOT DETECTED Final   Shiga like toxin producing E coli (STEC) NOT DETECTED NOT DETECTED Final   Shigella/Enteroinvasive E coli (EIEC) NOT DETECTED NOT DETECTED Final   Cryptosporidium NOT DETECTED NOT DETECTED Final   Cyclospora cayetanensis NOT DETECTED NOT DETECTED Final   Entamoeba histolytica NOT DETECTED NOT DETECTED Final   Giardia lamblia NOT DETECTED NOT DETECTED Final   Adenovirus F40/41 NOT DETECTED NOT DETECTED Final   Astrovirus NOT DETECTED NOT DETECTED Final   Norovirus GI/GII NOT DETECTED NOT DETECTED Final   Rotavirus A NOT DETECTED NOT DETECTED Final   Sapovirus (I, II, IV, and V) NOT DETECTED NOT DETECTED Final    Comment: Performed at [facility], [address] [place], [place] [address]  C Difficile Quick Screen w PCR reflex     Status: Abnormal   Collection Time: 11/13/23  9:31 PM   Specimen: Stool  Result Value Ref Range Status   C Diff antigen POSITIVE (A) NEGATIVE Final    Comment: RESULT CALLED TO, READ BACK BY AND VERIFIED WITH: E. SHAFFER AT 2335 ON 12.05.24 BY ADGER J     C Diff toxin NEGATIVE NEGATIVE Final    Comment: RESULT CALLED TO, READ BACK BY AND VERIFIED WITH: E. SHAFFER AT 2335 ON 12.05.24 BY ADGER J     C Diff interpretation Results are indeterminate. See PCR results.  Final    Comment: RESULT  CALLED TO, READ BACK BY AND VERIFIED WITH: E. SHAFFER AT 2335 ON 12.05.24 BY ADGER J  Performed at [facility], [address], [place], [place] [address]   C. Diff by PCR, Reflexed     Status: Abnormal   Collection Time: 11/13/23  9:31 PM  Result Value Ref Range Status   Toxigenic C. Difficile by PCR POSITIVE (A) NEGATIVE Final    Comment: Positive for toxigenic C. difficile with little to no toxin production. Only treat if clinical presentation suggests symptomatic illness. Performed at [facility], [address], [place], [place] [address]    Time  coordinating discharge:  37 mins  SIGNED:  Standley Dakins, MD  [facility] 11/15/2023, 9:59 AM How to contact the [facility] Attending or Consulting provider 7A - 7P or covering provider during after hours 7P -7A, for this patient?  Check the care team in [facility] and look for a) attending/consulting [facility] provider listed and b) the [facility] team listed Log into [email or web address] and use [place]'s universal password to access. If you do not have the password, please contact the hospital operator. Locate the [facility] provider you are looking for under [facility] and page to a number that you can be directly reached. If you still have difficulty reaching the provider, please page the [facility] (Director on Call) for the Hospitalists listed on amion for assistance.

## 2023-11-15 NOTE — Progress Notes (Signed)
diarrhea resolved.  Feels much better  On vancomycin.  Vital signs in last 24 hours: Temp:  [98.3 F (36.8 C)-98.5 F (36.9 C)] 98.5 F (36.9 C) (12/07 0512) Pulse Rate:  [76-90] 76 (12/07 0512) Resp:  [14-18] 14 (12/07 0512) BP: (118-119)/(78-89) 119/78 (12/07 0512) SpO2:  [98 %] 98 % (12/07 0512) Last BM Date : 11/14/23 General:   Alert,  Well-developed, well-nourished, pleasant and cooperative in NAD.  Accompanied by [place] her husband Abdomen: Flat, soft and nontender   Intake/Output from previous day: 12/06 0701 - 12/07 0700 In: 480 [P.O.:480] Out: -  Intake/Output this shift: No intake/output data recorded.  Lab Results: Recent Labs    11/13/23 1152 11/14/23 0439  WBC 4.8 4.2  HGB 14.0 11.4*  HCT 43.7 37.3  PLT 240 195   BMET Recent Labs    11/13/23 1152 11/14/23 0439 11/15/23 0430  NA 139 139 139  K 3.4* 3.1* 3.5  CL 110 112* 114*  CO2 19* 18* 19*  GLUCOSE 100* 98 98  BUN 22 16 11   CREATININE 0.76 0.55 0.55  CALCIUM 9.6 8.7* 8.8*   LFT Recent Labs    11/13/23 1152  PROT 6.0*  ALBUMIN 3.3*  AST 19  ALT 24  ALKPHOS 107  BILITOT 0.9   PT/INR No results for input(s): "LABPROT", "INR" in the last 72 hours. Hepatitis Panel No results for input(s): "HEPBSAG", "HCVAB", "HEPAIGM", "HEPBIGM" in the last 72 hours. C-Diff Recent Labs    11/13/23 2131  CDIFFTOX NEGATIVE    Studies/Results: CT ABDOMEN PELVIS W CONTRAST  Result Date: 11/13/2023 CLINICAL DATA:  Left lower quadrant abdominal pain. EXAM: CT ABDOMEN AND PELVIS WITH CONTRAST TECHNIQUE: Multidetector CT imaging of the abdomen and pelvis was performed using the standard protocol following bolus administration of intravenous contrast. RADIATION DOSE REDUCTION: This exam was performed according to the departmental dose-optimization program which includes automated exposure control, adjustment of the mA and/or kV according to patient size and/or use of iterative reconstruction technique.  CONTRAST:  80mL OMNIPAQUE IOHEXOL 350 MG/ML SOLN COMPARISON:  CT dated 09/15/2023. FINDINGS: Lower chest: The visualized lung bases are clear. No intra-abdominal free air or free fluid. Hepatobiliary: The liver is unremarkable. No biliary dilatation. The gallbladder is physiologically distended. No calcified gallstone or pericholecystic fluid Pancreas: Unremarkable. No pancreatic ductal dilatation or surrounding inflammatory changes. Spleen: Normal in size without focal abnormality. Adrenals/Urinary Tract: The adrenal glands unremarkable. There is no hydronephrosis on either side. There is symmetric enhancement and excretion of contrast by both kidneys. The visualized ureters and urinary bladder appear unremarkable. Small left posterior bladder diverticulum. Stomach/Bowel: There is diffuse mucosal enhancement and mild inflammatory changes of the: Consistent with pancolitis. Findings may be infectious in etiology or secondary to inflammatory bowel disease such as ulcerative colitis. Clinical correlation recommended. Loose stool throughout the colon suggestive of diarrheal state. Correlation with clinical exam and stool cultures recommended. There is a moderate size hiatal hernia. There is no bowel obstruction. The appendix is normal. Vascular/Lymphatic: Mild aortoiliac atherosclerotic disease. The IVC is unremarkable. No portal venous gas. There is no adenopathy. Reproductive: Hysterectomy.  No suspicious adnexal masses. Other: None Musculoskeletal: Osteopenia with degenerative changes. No acute osseous pathology. IMPRESSION: 1. Pancolitis and diarrheal state. Correlation with clinical exam and stool cultures recommended. No bowel obstruction. Normal appendix. 2. Moderate size hiatal hernia. 3.  Aortic Atherosclerosis (ICD10-I70.0). Electronically Signed   By: Elgie Collard M.D.   On: 11/13/2023 20:55     Impression: 77 year old lady with a history  of lymphocytic colitis felt to be in remission admitted to the  hospital with acute illness characterized by nonbloody diarrhea abdominal pain C. difficile toxin assay positive.  Initially, Dificid started but patient now on vancomycin with marked improvement in her diarrhea.  Agree with treatment for C. difficile.  I do not see an indication for empirically treating for recurrent lymphocytic colitis at this time.  Recommendations:  Agree with treatment plan.  Follow-up in our office 12/18.  Electrolyte management per attending.  Strict antiseptic practices in the bathroom as reviewed with the patient and husband  Signing off.

## 2023-11-17 ENCOUNTER — Telehealth: Payer: Self-pay

## 2023-11-17 DIAGNOSIS — F419 Anxiety disorder, unspecified: Secondary | ICD-10-CM | POA: Diagnosis not present

## 2023-11-17 DIAGNOSIS — C3492 Malignant neoplasm of unspecified part of left bronchus or lung: Secondary | ICD-10-CM | POA: Diagnosis not present

## 2023-11-17 DIAGNOSIS — K52832 Lymphocytic colitis: Secondary | ICD-10-CM | POA: Diagnosis not present

## 2023-11-17 DIAGNOSIS — E039 Hypothyroidism, unspecified: Secondary | ICD-10-CM | POA: Diagnosis not present

## 2023-11-17 DIAGNOSIS — Z7901 Long term (current) use of anticoagulants: Secondary | ICD-10-CM | POA: Diagnosis not present

## 2023-11-17 DIAGNOSIS — Z7952 Long term (current) use of systemic steroids: Secondary | ICD-10-CM | POA: Diagnosis not present

## 2023-11-17 DIAGNOSIS — B179 Acute viral hepatitis, unspecified: Secondary | ICD-10-CM | POA: Diagnosis not present

## 2023-11-17 DIAGNOSIS — F32A Depression, unspecified: Secondary | ICD-10-CM | POA: Diagnosis not present

## 2023-11-17 DIAGNOSIS — Z8673 Personal history of transient ischemic attack (TIA), and cerebral infarction without residual deficits: Secondary | ICD-10-CM | POA: Diagnosis not present

## 2023-11-17 DIAGNOSIS — I10 Essential (primary) hypertension: Secondary | ICD-10-CM | POA: Diagnosis not present

## 2023-11-17 DIAGNOSIS — D696 Thrombocytopenia, unspecified: Secondary | ICD-10-CM | POA: Diagnosis not present

## 2023-11-17 DIAGNOSIS — Z87891 Personal history of nicotine dependence: Secondary | ICD-10-CM | POA: Diagnosis not present

## 2023-11-17 NOTE — Transitions of Care (Post Inpatient/ED Visit) (Signed)
11/17/2023  Name: Patricia [name] MRN: [id number] DOB: January 10, 1946  Today's TOC FU Call Status: Today's TOC FU Call Status:: Successful TOC FU Call Completed TOC FU Call Complete Date: 11/17/23 Patient's Name and Date of Birth confirmed.  Transition Care Management Follow-up Telephone Call Date of Discharge: 11/15/23 [facility] Type of Discharge: Inpatient Admission Primary Inpatient Discharge Diagnosis:: weakness How have you been since you were released from the hospital?: Better Any questions or concerns?: No  Items Reviewed: Did you receive and understand the discharge instructions provided?: Yes Medications obtained,verified, and reconciled?: Yes (Medications Reviewed) Any new allergies since your discharge?: No Dietary orders reviewed?: Yes Do you have support at home?: Yes People in Home: spouse  Medications Reviewed Today: Medications Reviewed Today     Reviewed by Karena Addison, LPN (Licensed Practical Nurse) on 11/17/23 at 1049  Med List Status: <None>   Medication Order Taking? Sig Documenting Provider Last Dose Status Informant  acetaminophen (TYLENOL) 500 MG tablet [id number] No Take 1,000 mg by mouth every 6 (six) hours as needed for mild pain (pain score 1-3). [provider] 11/12/2023 Active Spouse/Significant Other, Self  ALPRAZolam (XANAX) 0.5 MG tablet [id number] No Take 1 tablet (0.5 mg total) by mouth at bedtime as needed for anxiety or sleep. TAKE (1) TABLET BY MOUTH AT BEDTIME AS NEEDED FOR SLEEP.  Patient taking differently: Take 0.5 mg by mouth at bedtime as needed for sleep. TAKE (1) TABLET BY MOUTH AT BEDTIME AS NEEDED FOR SLEEP.   Campbell Riches, NP Past Week Active Spouse/Significant Other, Self  apixaban (ELIQUIS) 5 MG TABS tablet [id number] No Take 1 tablet (5 mg total) by mouth 2 (two) times daily. Cleora Fleet, MD 11/13/2023 1000 Active Spouse/Significant Other, Self  famotidine (PEPCID) 40 MG tablet  [id number] No Take 1 tablet (40 mg total) by mouth daily. [name], MD 11/12/2023 Active Spouse/Significant Other, Self  KLOR-CON M20 20 MEQ tablet [id number] No TAKE 1 TABLET BY MOUTH THREE TIMES A DAY Luking, Scott A, MD 11/12/2023 Active Spouse/Significant Other, Self  lansoprazole (PREVACID) 30 MG capsule [id number] No Take 30 mg by mouth daily. [provider] 11/12/2023 Active Spouse/Significant Other, Self  levothyroxine (SYNTHROID) 50 MCG tablet [id number] No TAKE 1 TABLET BY MOUTH DAILY BEFORE Earleen Newport, MD 11/13/2023 Active Spouse/Significant Other, Self  loperamide (IMODIUM) 2 MG capsule [id number] No Take 2 mg by mouth as needed for diarrhea or loose stools. [provider] 11/13/2023 Active Spouse/Significant Other, Self  ondansetron (ZOFRAN) 8 MG tablet [id number] No Take 0.5 tablets (4 mg total) by mouth every 8 (eight) hours as needed for nausea or vomiting. Si Gaul, MD 11/13/2023 Active Spouse/Significant Other, Self  topiramate (TOPAMAX) 50 MG tablet [id number] No TAKE 1 TABLET BY MOUTH TWICE A DAY Henreitta Leber, MD 11/13/2023 Active Spouse/Significant Other, Self  vancomycin (VANCOCIN) 125 MG capsule [id number]  Take 1 capsule (125 mg total) by mouth 4 (four) times daily for 10 days. Cleora Fleet, MD  Active             Home Care and Equipment/Supplies: Were Home Health Services Ordered?: Yes Name of Home Health Agency:: unknown Has Agency set up a time to come to your home?: Yes First Home Health Visit Date: 11/17/23 Any new equipment or medical supplies ordered?: NA  Functional Questionnaire: Do you need assistance with bathing/showering or dressing?: No Do you need assistance with meal preparation?: Yes Do you need assistance with eating?: No Do you  have difficulty maintaining continence: No Do you need assistance with getting out of bed/getting out of a chair/moving?: No Do you have difficulty managing or taking your  medications?: Yes  Follow up appointments reviewed: PCP Follow-up appointment confirmed?: Yes Date of PCP follow-up appointment?: 11/21/23 Follow-up Provider: [facility] Follow-up appointment confirmed?: No Reason Specialist Follow-Up Not Confirmed: Patient has Specialist Provider Number and will Call for Appointment Do you need transportation to your follow-up appointment?: No Do you understand care options if your condition(s) worsen?: Yes-patient verbalized understanding    SIGNATURE Karena Addison, LPN [facility] Nurse Health Advisor Direct Dial [phone]

## 2023-11-18 NOTE — Telephone Encounter (Signed)
Copied from CRM [id number]. Topic: Clinical - Home Health Verbal Orders >> Nov 18, 2023  9:08 AM Maxwell Marion wrote: Caller/Agency: [facility] Number: [phone] Service Requested: Physical Therapy Frequency: 2 week 2, 1 week 2 Any new concerns about the patient? No   ---- Home Health PT orders are being requested for the patient Frequency: 2 week 2, 1 week 2--- Please advise

## 2023-11-18 NOTE — Telephone Encounter (Signed)
May give approval for home health thank you

## 2023-11-19 NOTE — Telephone Encounter (Signed)
Called [facility] and gave verbal order for home health Per Dr Gerda Diss

## 2023-11-20 DIAGNOSIS — B179 Acute viral hepatitis, unspecified: Secondary | ICD-10-CM | POA: Diagnosis not present

## 2023-11-20 DIAGNOSIS — Z7901 Long term (current) use of anticoagulants: Secondary | ICD-10-CM | POA: Diagnosis not present

## 2023-11-20 DIAGNOSIS — Z87891 Personal history of nicotine dependence: Secondary | ICD-10-CM | POA: Diagnosis not present

## 2023-11-20 DIAGNOSIS — C3492 Malignant neoplasm of unspecified part of left bronchus or lung: Secondary | ICD-10-CM | POA: Diagnosis not present

## 2023-11-20 DIAGNOSIS — F419 Anxiety disorder, unspecified: Secondary | ICD-10-CM | POA: Diagnosis not present

## 2023-11-20 DIAGNOSIS — I10 Essential (primary) hypertension: Secondary | ICD-10-CM | POA: Diagnosis not present

## 2023-11-20 DIAGNOSIS — Z8673 Personal history of transient ischemic attack (TIA), and cerebral infarction without residual deficits: Secondary | ICD-10-CM | POA: Diagnosis not present

## 2023-11-20 DIAGNOSIS — D696 Thrombocytopenia, unspecified: Secondary | ICD-10-CM | POA: Diagnosis not present

## 2023-11-20 DIAGNOSIS — E039 Hypothyroidism, unspecified: Secondary | ICD-10-CM | POA: Diagnosis not present

## 2023-11-20 DIAGNOSIS — Z7952 Long term (current) use of systemic steroids: Secondary | ICD-10-CM | POA: Diagnosis not present

## 2023-11-20 DIAGNOSIS — F32A Depression, unspecified: Secondary | ICD-10-CM | POA: Diagnosis not present

## 2023-11-20 DIAGNOSIS — K52832 Lymphocytic colitis: Secondary | ICD-10-CM | POA: Diagnosis not present

## 2023-11-21 ENCOUNTER — Ambulatory Visit: Payer: Medicare HMO | Admitting: Family Medicine

## 2023-11-21 DIAGNOSIS — R197 Diarrhea, unspecified: Secondary | ICD-10-CM

## 2023-11-21 DIAGNOSIS — K5100 Ulcerative (chronic) pancolitis without complications: Secondary | ICD-10-CM | POA: Diagnosis not present

## 2023-11-21 DIAGNOSIS — D649 Anemia, unspecified: Secondary | ICD-10-CM | POA: Diagnosis not present

## 2023-11-21 DIAGNOSIS — C3492 Malignant neoplasm of unspecified part of left bronchus or lung: Secondary | ICD-10-CM | POA: Diagnosis not present

## 2023-11-21 DIAGNOSIS — Z8673 Personal history of transient ischemic attack (TIA), and cerebral infarction without residual deficits: Secondary | ICD-10-CM | POA: Diagnosis not present

## 2023-11-21 NOTE — Progress Notes (Signed)
   Subjective:    Patient ID: Patricia [name], female    DOB: 06/27/1946, 77 y.o.   MRN: [id number]  HPI Very nice patient Has lung cancer Recently in the hospital with C. difficile Currently on vancomycin doing better Starting to get her energy back Breathing seems to be okay Walking with a walker Able to eat much better Able to move around her house with minimal assistance Is still under the care of oncology for lung cancer Hospitalization as well as lab notes office visit notes and tests were reviewed   Review of Systems     Objective:   Physical Exam  General-in no acute distress Eyes-no discharge Lungs-respiratory rate normal, CTA CV-no murmurs,RRR Extremities skin warm dry no edema Neuro grossly normal Behavior normal, alert Blood pressure checked sitting and standing patient not orthostatic      Assessment & Plan:   1. Hypocalcemia (Primary) Lab work shows low calcium we will check lab work await results - Basic Metabolic Panel - Magnesium - CBC with Differential  2. Anemia, unspecified type Patient with mild anemia while in the hospital recheck hemoglobin - Basic Metabolic Panel - Magnesium - CBC with Differential  3. Diarrhea, unspecified type Doing better on vancomycin finishing this up for C. difficile - Basic Metabolic Panel - Magnesium - CBC with Differential  4. Non-small cell lung cancer, left (HCC) Under the care of oncology Patient would like to shift her oncology care here because she having a very difficult time from a transportation standpoint and family support standpoint and would like to be closer  5. History of stroke Continue Eliquis no sign of bleeding

## 2023-11-22 LAB — BASIC METABOLIC PANEL
BUN/Creatinine Ratio: 19 (ref 12–28)
BUN: 16 mg/dL (ref 8–27)
CO2: 18 mmol/L — ABNORMAL LOW (ref 20–29)
Calcium: 9.9 mg/dL (ref 8.7–10.3)
Chloride: 111 mmol/L — ABNORMAL HIGH (ref 96–106)
Creatinine, Ser: 0.84 mg/dL (ref 0.57–1.00)
Glucose: 114 mg/dL — ABNORMAL HIGH (ref 70–99)
Potassium: 5.4 mmol/L — ABNORMAL HIGH (ref 3.5–5.2)
Sodium: 144 mmol/L (ref 134–144)
eGFR: 72 mL/min/{1.73_m2} (ref >59)

## 2023-11-22 LAB — CBC WITH DIFFERENTIAL/PLATELET
Basophils Absolute: 0.2 10*3/uL (ref 0.0–0.2)
Basos: 2 %
EOS (ABSOLUTE): 0.1 10*3/uL (ref 0.0–0.4)
Eos: 1 %
Hematocrit: 48.1 % — ABNORMAL HIGH (ref 34.0–46.6)
Hemoglobin: 15 g/dL (ref 11.1–15.9)
Immature Grans (Abs): 0.3 10*3/uL — ABNORMAL HIGH (ref 0.0–0.1)
Immature Granulocytes: 4 %
Lymphocytes Absolute: 2.7 10*3/uL (ref 0.7–3.1)
Lymphs: 31 %
MCH: 28.6 pg (ref 26.6–33.0)
MCHC: 31.2 g/dL — ABNORMAL LOW (ref 31.5–35.7)
MCV: 92 fL (ref 79–97)
Monocytes Absolute: 0.6 10*3/uL (ref 0.1–0.9)
Monocytes: 7 %
Neutrophils Absolute: 4.8 10*3/uL (ref 1.4–7.0)
Neutrophils: 55 %
Platelets: 387 10*3/uL (ref 150–450)
RBC: 5.24 x10E6/uL (ref 3.77–5.28)
RDW: 14.6 % (ref 11.7–15.4)
WBC: 8.6 10*3/uL (ref 3.4–10.8)

## 2023-11-22 LAB — MAGNESIUM: Magnesium: 2.2 mg/dL (ref 1.6–2.3)

## 2023-11-25 DIAGNOSIS — E039 Hypothyroidism, unspecified: Secondary | ICD-10-CM | POA: Diagnosis not present

## 2023-11-25 DIAGNOSIS — Z87891 Personal history of nicotine dependence: Secondary | ICD-10-CM | POA: Diagnosis not present

## 2023-11-25 DIAGNOSIS — B179 Acute viral hepatitis, unspecified: Secondary | ICD-10-CM | POA: Diagnosis not present

## 2023-11-25 DIAGNOSIS — C3492 Malignant neoplasm of unspecified part of left bronchus or lung: Secondary | ICD-10-CM | POA: Diagnosis not present

## 2023-11-25 DIAGNOSIS — F419 Anxiety disorder, unspecified: Secondary | ICD-10-CM | POA: Diagnosis not present

## 2023-11-25 DIAGNOSIS — D696 Thrombocytopenia, unspecified: Secondary | ICD-10-CM | POA: Diagnosis not present

## 2023-11-25 DIAGNOSIS — Z8673 Personal history of transient ischemic attack (TIA), and cerebral infarction without residual deficits: Secondary | ICD-10-CM | POA: Diagnosis not present

## 2023-11-25 DIAGNOSIS — F32A Depression, unspecified: Secondary | ICD-10-CM | POA: Diagnosis not present

## 2023-11-25 DIAGNOSIS — K52832 Lymphocytic colitis: Secondary | ICD-10-CM | POA: Diagnosis not present

## 2023-11-25 DIAGNOSIS — Z7952 Long term (current) use of systemic steroids: Secondary | ICD-10-CM | POA: Diagnosis not present

## 2023-11-25 DIAGNOSIS — I10 Essential (primary) hypertension: Secondary | ICD-10-CM | POA: Diagnosis not present

## 2023-11-25 DIAGNOSIS — Z7901 Long term (current) use of anticoagulants: Secondary | ICD-10-CM | POA: Diagnosis not present

## 2023-11-26 ENCOUNTER — Encounter: Payer: Self-pay | Admitting: Gastroenterology

## 2023-11-26 ENCOUNTER — Ambulatory Visit: Payer: Medicare HMO | Admitting: Gastroenterology

## 2023-11-26 VITALS — BP 136/87 | HR 82 | Temp 98.2°F | Ht <= 58 in | Wt 105.0 lb

## 2023-11-26 DIAGNOSIS — A0472 Enterocolitis due to Clostridium difficile, not specified as recurrent: Secondary | ICD-10-CM

## 2023-11-26 DIAGNOSIS — K219 Gastro-esophageal reflux disease without esophagitis: Secondary | ICD-10-CM | POA: Diagnosis not present

## 2023-11-26 NOTE — Progress Notes (Signed)
GI Office Note    Referring Provider: [name], MD Primary Care Physician:  [name], MD  Primary Gastroenterologist: Roetta Sessions, MD   Chief Complaint   Chief Complaint  Patient presents with   Follow-up    Follow up from hospital---pt is not having diarrhea. It stopped in the hospital    History of Present Illness   Patricia [name] is a 77 y.o. female presenting today for follow up of diarrhea.  Hospitalized back in July with colitis/diarrhea.  CT with findings consistent with colitis from splenic flexure to the level of the rectum.  Underwent a colonoscopy which showed friability with contact bleeding in the ascending colon and in the cecum, pathology showed lymphocytic colitis.  C. difficile testing negative. She was given budesonide 9 mg daily for 8 weeks.  Diarrhea resolved.  Admission in November with failure to thrive, nausea/vomiting/diarrhea in the setting of new chemotherapy drug adagrasib. Found to have transaminitis/acute hepatitis likely secondary to adagrasib.   Readmitted to the hospital couple weeks ago, CT abdomen pelvis revealed pancolitis.  C. difficile antigen positive, toxin negative but PCR was positive.  Initially was started on Dificid but the cost was too high and she was switched over to oral vancomycin.  Completing a 10-day course.     Today: doing better. Having 2-3 mushy stools just in the mornings. Abdominal pain improved. Still sore. Appetite gradually improving. Weight up a few pounds. No heartburn on lansoprazole.    Colonoscopy July 2024: -Friability with contact bleeding in the ascending colon and in the cecum pathology consistent with lymphocytic colitis -Distal rectum and anal verge normal  EGD October 2022: -Normal esophagus -Medium sized hiatal hernia -Normal stomach -Normal duodenal bulb and second portion of duodenum   Wt Readings from Last 5 Encounters:  11/26/23 105 lb (47.6 kg)  11/21/23 103 lb 6.4 oz  (46.9 kg)  11/13/23 100 lb (45.4 kg)  10/28/23 102 lb 4.8 oz (46.4 kg)  10/22/23 104 lb (47.2 kg)     Medications   Current Outpatient Medications  Medication Sig Dispense Refill   acetaminophen (TYLENOL) 500 MG tablet Take 1,000 mg by mouth every 6 (six) hours as needed for mild pain (pain score 1-3).     ALPRAZolam (XANAX) 0.5 MG tablet Take 1 tablet (0.5 mg total) by mouth at bedtime as needed for anxiety or sleep. TAKE (1) TABLET BY MOUTH AT BEDTIME AS NEEDED FOR SLEEP. (Patient taking differently: Take 0.5 mg by mouth at bedtime as needed for sleep. TAKE (1) TABLET BY MOUTH AT BEDTIME AS NEEDED FOR SLEEP.) 30 tablet 2   apixaban (ELIQUIS) 5 MG TABS tablet Take 1 tablet (5 mg total) by mouth 2 (two) times daily. 60 tablet 1   famotidine (PEPCID) 40 MG tablet Take 1 tablet (40 mg total) by mouth daily. 30 tablet 5   KLOR-CON M20 20 MEQ tablet TAKE 1 TABLET BY MOUTH THREE TIMES A DAY 270 tablet 1   lansoprazole (PREVACID) 30 MG capsule Take 30 mg by mouth daily.     levothyroxine (SYNTHROID) 50 MCG tablet TAKE 1 TABLET BY MOUTH DAILY BEFORE BREAKFAST 90 tablet 1   loperamide (IMODIUM) 2 MG capsule Take 2 mg by mouth as needed for diarrhea or loose stools.     ondansetron (ZOFRAN) 8 MG tablet Take 0.5 tablets (4 mg total) by mouth every 8 (eight) hours as needed for nausea or vomiting. 20 tablet 0   topiramate (TOPAMAX) 50 MG tablet  TAKE 1 TABLET BY MOUTH TWICE A DAY 180 tablet 1   vancomycin (VANCOCIN) 125 MG capsule Take 125 mg by mouth 4 (four) times daily.     No current facility-administered medications for this visit.    Allergies   Allergies as of 11/26/2023 - Review Complete 11/26/2023  Allergen Reaction Noted   Codeine Nausea Only 09/11/2011       Review of Systems   General: Negative for fever, chills. Fatigue improving ENT: Negative for hoarseness, difficulty swallowing , nasal congestion. CV: Negative for chest pain, angina, palpitations, dyspnea on exertion,  peripheral edema.  Respiratory: Negative for dyspnea at rest, dyspnea on exertion, cough, sputum, wheezing.  GI: See history of present illness. GU:  Negative for dysuria, hematuria, urinary incontinence, urinary frequency, nocturnal urination.  Endo: Negative for unusual weight change.     Physical Exam   BP 136/87   Pulse 82   Temp 98.2 F (36.8 C)   Ht 4\' 8"  (1.422 m)   Wt 105 lb (47.6 kg) Comment: prior enter of weight was wrong  BMI 23.54 kg/m    General: Well-nourished, well-developed in no acute distress.  Eyes: No icterus. Mouth: Oropharyngeal mucosa moist and pink  Abdomen: Bowel sounds are normal,  nondistended, mild diffuse tenderness. No rebound or guarding.  Rectal: not performed  Extremities: No lower extremity edema. No clubbing or deformities. Neuro: Alert and oriented x 4   Skin: Warm and dry, no jaundice.   Psych: Alert and cooperative, normal mood and affect.  Labs   Lab Results  Component Value Date   NA 144 11/21/2023   CL 111 (H) 11/21/2023   K 5.4 (H) 11/21/2023   CO2 18 (L) 11/21/2023   BUN 16 11/21/2023   CREATININE 0.84 11/21/2023   EGFR 72 11/21/2023   CALCIUM 9.9 11/21/2023   PHOS 2.5 06/23/2023   ALBUMIN 3.3 (L) 11/13/2023   GLUCOSE 114 (H) 11/21/2023   Lab Results  Component Value Date   WBC 8.6 11/21/2023   HGB 15.0 11/21/2023   HCT 48.1 (H) 11/21/2023   MCV 92 11/21/2023   PLT 387 11/21/2023   Lab Results  Component Value Date   ALT 24 11/13/2023   AST 19 11/13/2023   ALKPHOS 107 11/13/2023   BILITOT 0.9 11/13/2023     Imaging Studies   CT ABDOMEN PELVIS W CONTRAST Result Date: 11/13/2023 CLINICAL DATA:  Left lower quadrant abdominal pain. EXAM: CT ABDOMEN AND PELVIS WITH CONTRAST TECHNIQUE: Multidetector CT imaging of the abdomen and pelvis was performed using the standard protocol following bolus administration of intravenous contrast. RADIATION DOSE REDUCTION: This exam was performed according to the departmental  dose-optimization program which includes automated exposure control, adjustment of the mA and/or kV according to patient size and/or use of iterative reconstruction technique. CONTRAST:  80mL OMNIPAQUE IOHEXOL 350 MG/ML SOLN COMPARISON:  CT dated 09/15/2023. FINDINGS: Lower chest: The visualized lung bases are clear. No intra-abdominal free air or free fluid. Hepatobiliary: The liver is unremarkable. No biliary dilatation. The gallbladder is physiologically distended. No calcified gallstone or pericholecystic fluid Pancreas: Unremarkable. No pancreatic ductal dilatation or surrounding inflammatory changes. Spleen: Normal in size without focal abnormality. Adrenals/Urinary Tract: The adrenal glands unremarkable. There is no hydronephrosis on either side. There is symmetric enhancement and excretion of contrast by both kidneys. The visualized ureters and urinary bladder appear unremarkable. Small left posterior bladder diverticulum. Stomach/Bowel: There is diffuse mucosal enhancement and mild inflammatory changes of the: Consistent with pancolitis. Findings may be infectious  in etiology or secondary to inflammatory bowel disease such as ulcerative colitis. Clinical correlation recommended. Loose stool throughout the colon suggestive of diarrheal state. Correlation with clinical exam and stool cultures recommended. There is a moderate size hiatal hernia. There is no bowel obstruction. The appendix is normal. Vascular/Lymphatic: Mild aortoiliac atherosclerotic disease. The IVC is unremarkable. No portal venous gas. There is no adenopathy. Reproductive: Hysterectomy.  No suspicious adnexal masses. Other: None Musculoskeletal: Osteopenia with degenerative changes. No acute osseous pathology. IMPRESSION: 1. Pancolitis and diarrheal state. Correlation with clinical exam and stool cultures recommended. No bowel obstruction. Normal appendix. 2. Moderate size hiatal hernia. 3.  Aortic Atherosclerosis (ICD10-I70.0).  Electronically Signed   By: Elgie Collard M.D.   On: 11/13/2023 20:55    Assessment/Plan:   GERD: doing well -continue lansoprazole 30mg  before breakfast and famotidine 40mg  in evening.    Diarrhea: history of lymphocytic colitis diagnosed in July 2024, treated with budesonide with resolution of her diarrhea.  She had recurrence of diarrhea and was hospitalized earlier this month and noted to have C. difficile.  She is completing vancomycin course at this time.  Diarrhea is greatly improved.  She continues to have mushy stools but only 2-3 in the mornings -complete vancomycin. Discussed monitor for persistent diarrhea, worsening symptoms as she is at risk of recurrent Cdiff and flare of lymphocytic colitis as well.  -continue to use bleach based cleaners on surfaces. Change out toothbrushes. Use soap and water with handwashing.   Return to the office in one year or sooner if needed.    Leanna Battles. Melvyn Neth, MHS, PA-C [facility]

## 2023-11-26 NOTE — Patient Instructions (Addendum)
Complete your vancomycin.  Monitor for any recurrent diarrhea, abdominal pain.  Please let us know if you have any issues. Continue lansoprazole 30 mg daily and famotidine 40 mg daily for acid reflux. Return to the office in one year or sooner if needed.

## 2023-11-27 DIAGNOSIS — Z8673 Personal history of transient ischemic attack (TIA), and cerebral infarction without residual deficits: Secondary | ICD-10-CM | POA: Diagnosis not present

## 2023-11-27 DIAGNOSIS — Z7952 Long term (current) use of systemic steroids: Secondary | ICD-10-CM | POA: Diagnosis not present

## 2023-11-27 DIAGNOSIS — Z7901 Long term (current) use of anticoagulants: Secondary | ICD-10-CM | POA: Diagnosis not present

## 2023-11-27 DIAGNOSIS — F32A Depression, unspecified: Secondary | ICD-10-CM | POA: Diagnosis not present

## 2023-11-27 DIAGNOSIS — C3492 Malignant neoplasm of unspecified part of left bronchus or lung: Secondary | ICD-10-CM | POA: Diagnosis not present

## 2023-11-27 DIAGNOSIS — I10 Essential (primary) hypertension: Secondary | ICD-10-CM | POA: Diagnosis not present

## 2023-11-27 DIAGNOSIS — F419 Anxiety disorder, unspecified: Secondary | ICD-10-CM | POA: Diagnosis not present

## 2023-11-27 DIAGNOSIS — K52832 Lymphocytic colitis: Secondary | ICD-10-CM | POA: Diagnosis not present

## 2023-11-27 DIAGNOSIS — D696 Thrombocytopenia, unspecified: Secondary | ICD-10-CM | POA: Diagnosis not present

## 2023-11-27 DIAGNOSIS — B179 Acute viral hepatitis, unspecified: Secondary | ICD-10-CM | POA: Diagnosis not present

## 2023-11-27 DIAGNOSIS — Z87891 Personal history of nicotine dependence: Secondary | ICD-10-CM | POA: Diagnosis not present

## 2023-11-27 DIAGNOSIS — E039 Hypothyroidism, unspecified: Secondary | ICD-10-CM | POA: Diagnosis not present

## 2023-11-28 ENCOUNTER — Telehealth: Payer: Self-pay | Admitting: *Deleted

## 2023-11-28 NOTE — Telephone Encounter (Signed)
Husband stated that the patient does not want to reschedule this at this time

## 2023-11-28 NOTE — Telephone Encounter (Signed)
Campbell Riches, NP   Yes. Please reschedule visit. Thanks.

## 2023-11-28 NOTE — Telephone Encounter (Signed)
Left message to return call with home health to advise patient of providers recommendations below

## 2023-11-28 NOTE — Telephone Encounter (Unsigned)
Copied from CRM [id number]. Topic: General - Other >> Nov 28, 2023  8:32 AM Dollene Primrose wrote: Reason for CRM: Marcelino Duster from [facility] [phone] Calling because patient refused OT this morning, would like to know if can reschedule visit.

## 2023-11-28 NOTE — Telephone Encounter (Unsigned)
Copied from CRM [id number]. Topic: Clinical - Home Health Verbal Orders >> Nov 28, 2023  4:07 PM Carloyn Manner C wrote: Reason for CRM: Patient [facility] home health called in to have a verbal order for the patient

## 2023-12-01 ENCOUNTER — Telehealth: Payer: Self-pay

## 2023-12-01 ENCOUNTER — Ambulatory Visit: Payer: Self-pay | Admitting: *Deleted

## 2023-12-01 ENCOUNTER — Telehealth: Payer: Self-pay | Admitting: Family Medicine

## 2023-12-01 DIAGNOSIS — C3492 Malignant neoplasm of unspecified part of left bronchus or lung: Secondary | ICD-10-CM

## 2023-12-01 DIAGNOSIS — Z7952 Long term (current) use of systemic steroids: Secondary | ICD-10-CM | POA: Diagnosis not present

## 2023-12-01 DIAGNOSIS — D696 Thrombocytopenia, unspecified: Secondary | ICD-10-CM | POA: Diagnosis not present

## 2023-12-01 DIAGNOSIS — K52832 Lymphocytic colitis: Secondary | ICD-10-CM | POA: Diagnosis not present

## 2023-12-01 DIAGNOSIS — I10 Essential (primary) hypertension: Secondary | ICD-10-CM | POA: Diagnosis not present

## 2023-12-01 DIAGNOSIS — F419 Anxiety disorder, unspecified: Secondary | ICD-10-CM | POA: Diagnosis not present

## 2023-12-01 DIAGNOSIS — E039 Hypothyroidism, unspecified: Secondary | ICD-10-CM | POA: Diagnosis not present

## 2023-12-01 DIAGNOSIS — B179 Acute viral hepatitis, unspecified: Secondary | ICD-10-CM | POA: Diagnosis not present

## 2023-12-01 DIAGNOSIS — Z7901 Long term (current) use of anticoagulants: Secondary | ICD-10-CM | POA: Diagnosis not present

## 2023-12-01 DIAGNOSIS — Z8673 Personal history of transient ischemic attack (TIA), and cerebral infarction without residual deficits: Secondary | ICD-10-CM | POA: Diagnosis not present

## 2023-12-01 DIAGNOSIS — F32A Depression, unspecified: Secondary | ICD-10-CM | POA: Diagnosis not present

## 2023-12-01 DIAGNOSIS — Z87891 Personal history of nicotine dependence: Secondary | ICD-10-CM | POA: Diagnosis not present

## 2023-12-01 MED ORDER — VANCOMYCIN HCL 125 MG PO CAPS: ORAL_CAPSULE | ORAL | 0 refills | Status: DC

## 2023-12-01 NOTE — Telephone Encounter (Signed)
Recently completed antibiotics for Cdiff.   With the holidays, and recently completing vancomycin (she was doing well on 11/26/23 when I saw her), I would treat as Cdiff recurrence. Dificid may be difficult to obtain urgently therefore will treat with vanc taper.  I am sending in vancomcyin with a taper.   Vancomycin? in a tapered and pulsed regimen, for example: 125 mg orally 4 times daily for 10 to 14 days, then 125 mg orally 2 times daily for 7 days, then 125 mg orally once daily for 7 days, then 125 mg orally every 2 to 3 days for 2 to 8 weeks   SHE WILL NEED AN OV IN 2 WEEKS OR CALL SOONER IF DIARRHEA NOT IMPROVED

## 2023-12-01 NOTE — Telephone Encounter (Signed)
Please put in referral for Dr. Ellin Saba for cancer care per patient request thank you

## 2023-12-01 NOTE — Telephone Encounter (Signed)
Pt's husband called stating that the pt has been to the bathroom every 30 mins for the last couple hrs and is wanting to know what she can do.

## 2023-12-01 NOTE — Telephone Encounter (Signed)
Lmom for return call.  

## 2023-12-01 NOTE — Addendum Note (Signed)
Addended by: Tiffany Kocher on: 12/01/2023 02:25 PM   Modules accepted: Orders

## 2023-12-01 NOTE — Telephone Encounter (Signed)
Referral ordered in EPIC. 

## 2023-12-02 NOTE — Telephone Encounter (Signed)
So noted 

## 2023-12-03 ENCOUNTER — Other Ambulatory Visit: Payer: Self-pay | Admitting: Internal Medicine

## 2023-12-04 NOTE — Telephone Encounter (Signed)
Husband was made aware and verbalized understanding. Pt was started on vanc Tuesday.

## 2023-12-12 DIAGNOSIS — Z7901 Long term (current) use of anticoagulants: Secondary | ICD-10-CM | POA: Diagnosis not present

## 2023-12-12 DIAGNOSIS — B179 Acute viral hepatitis, unspecified: Secondary | ICD-10-CM | POA: Diagnosis not present

## 2023-12-12 DIAGNOSIS — K52832 Lymphocytic colitis: Secondary | ICD-10-CM | POA: Diagnosis not present

## 2023-12-12 DIAGNOSIS — E039 Hypothyroidism, unspecified: Secondary | ICD-10-CM | POA: Diagnosis not present

## 2023-12-12 DIAGNOSIS — C3492 Malignant neoplasm of unspecified part of left bronchus or lung: Secondary | ICD-10-CM | POA: Diagnosis not present

## 2023-12-12 DIAGNOSIS — F419 Anxiety disorder, unspecified: Secondary | ICD-10-CM | POA: Diagnosis not present

## 2023-12-12 DIAGNOSIS — Z7952 Long term (current) use of systemic steroids: Secondary | ICD-10-CM | POA: Diagnosis not present

## 2023-12-12 DIAGNOSIS — Z8673 Personal history of transient ischemic attack (TIA), and cerebral infarction without residual deficits: Secondary | ICD-10-CM | POA: Diagnosis not present

## 2023-12-12 DIAGNOSIS — I10 Essential (primary) hypertension: Secondary | ICD-10-CM | POA: Diagnosis not present

## 2023-12-12 DIAGNOSIS — Z87891 Personal history of nicotine dependence: Secondary | ICD-10-CM | POA: Diagnosis not present

## 2023-12-12 DIAGNOSIS — D696 Thrombocytopenia, unspecified: Secondary | ICD-10-CM | POA: Diagnosis not present

## 2023-12-12 DIAGNOSIS — F32A Depression, unspecified: Secondary | ICD-10-CM | POA: Diagnosis not present

## 2023-12-15 ENCOUNTER — Ambulatory Visit: Payer: Medicare HMO | Admitting: Family Medicine

## 2023-12-15 ENCOUNTER — Encounter: Payer: Self-pay | Admitting: Family Medicine

## 2023-12-15 DIAGNOSIS — N3001 Acute cystitis with hematuria: Secondary | ICD-10-CM | POA: Diagnosis not present

## 2023-12-15 LAB — POCT URINALYSIS DIP (CLINITEK)
Glucose, UA: NEGATIVE mg/dL
Nitrite, UA: NEGATIVE
POC PROTEIN,UA: 100 — AB
Spec Grav, UA: >=1.03 — AB (ref 1.010–1.025)
Urobilinogen, UA: 0.2 U/dL
pH, UA: 6 (ref 5.0–8.0)

## 2023-12-15 MED ORDER — CEPHALEXIN 500 MG PO CAPS: 500.0000 mg | ORAL_CAPSULE | Freq: Two times a day (BID) | ORAL | 0 refills | Status: DC

## 2023-12-15 NOTE — Assessment & Plan Note (Signed)
 History and urinalysis consistent with UTI.  Sending culture.  Placing on Keflex.

## 2023-12-15 NOTE — Progress Notes (Signed)
 [facility] [address], [place] [address]   Clinic Day:  12/16/2023  Referring physician: Alphonsa Glendia LABOR, MD  Patient Care Team: Alphonsa Glendia LABOR, MD as PCP - General (Family Medicine) Rogers Hai, MD as Medical Oncologist (Medical Oncology) Celestia Joesph SQUIBB, RN as Oncology Nurse Navigator (Medical Oncology)   ASSESSMENT & PLAN:   Assessment:  1.  Metastatic adenocarcinoma of the lung PD-L1 99%, K-ras G12 C: - PET scan at diagnosis (12/06/2022): 2.3 cm left lung nodule, hypermetabolic mediastinal and left supraclavicular adenopathy and enlarged AP window lymph node impinging on the left recurrent laryngeal nerve.  Multiple hypermetabolic cutaneous nodules within the chest, abdomen and pelvis and extremities.  Right adrenal gland metastasis.  Peritoneal and retroperitoneal nodular metastasis.  Muscular metastasis primarily in the pelvis. - Left supraclavicular lymph node biopsy (12/16/2022): Metastatic adenocarcinoma.  Positive for CK7 and TTF-1. - Foundation 1: K-ras G 12C.  MS-stable.  TMB-low.  PD-L1 TPS-99%. - Cycle 1 of carboplatin , pembrolizumab  and pemetrexed  on 12/30/2022, abandoned as she was hospitalized with stroke. - Keytruda  from 02/18/2023 through 08/18/2023, discontinued after hospital admission with diarrhea and colitis.  Colonoscopy and biopsies showed lymphocytic colitis which improved with 8 weeks of Entocort. - Adagrasib  600 mg twice daily started on September 26, 2023, discontinued after hospitalization on 11/13/2023 through 11/15/2023 with C. difficile colitis   2. Social/Family History: -Lives at home with husband. She is somewhat independent of ADL's and dependent of IADL's. Quit tobacco use over 30 years ago.  She has left leg weakness from prior CVA and has difficulty with balance.  Uses walker at home. -Maternal uncle had lung cancer. Paternal aunt had ovarian cancer.  Plan:  1.  Metastatic adenocarcinoma of the left lung, PD-L1 99%,  K-ras G 12C: - Reviewed recent CT CAP from 11/13/2023: No evidence of metastatic disease other than pancolitis. - Reviewed CT CAP from 09/15/2023: Stable left lower lobe lung nodule 9 mm.  No thoracic adenopathy. - She is deconditioned from hospitalizations.  She is currently on vancomycin  taper for C. difficile infection.  She is having soft stools 2 to 3/day which is her baseline.  She was reportedly started on Keflex  yesterday for UTI. - Not entirely clear if she had pembrolizumab  induced colitis as her colonoscopy and biopsy showed lymphocytic colitis.  Immunotherapy induced colitis usually has neutrophilic colitis. - I have recommended restaging PET CT scan as she had subcutaneous nodules and peritoneal disease at diagnosis.  If there is no evidence of disease, it is reasonable to stop  therapy and monitor with subsequent scans.  If she has recurrence, and her diarrhea issue resolved, we may try cemiplimab. - RTC after PET scan to discuss further plan.   Orders Placed This Encounter  Procedures   NM PET Image Restag (PS) Skull Base To Thigh    Standing Status:   Future    Expected Date:   12/23/2023    Expiration Date:   12/15/2024    If indicated for the ordered procedure, I authorize the administration of a radiopharmaceutical per Radiology protocol:   Yes    Preferred imaging location?:   [facility]    Release to patient:   Immediate      I,Helena R Teague,acting as a scribe for Hai Rogers, MD.,have documented all relevant documentation on the behalf of Hai Rogers, MD,as directed by  Hai Rogers, MD while in the presence of Hai Rogers, MD.   I, Hai Rogers MD, have reviewed the  above documentation for accuracy and completeness, and I agree with the above.   Alean Stands, MD   1/7/20255:09 PM  CHIEF COMPLAINT/PURPOSE OF CONSULT:   Diagnosis: Non-small cell lung cancer   Cancer Staging  Non-small cell lung cancer, left  (HCC) Staging form: Lung, AJCC 8th Edition - Clinical: Stage IVB (cT1c, cN3, cM1c) - Signed by Sherrod Sherrod, MD on 12/19/2022    Prior Therapy:  1) 1 cycle of systemic chemotherapy with carboplatin  for an AUC of 5 and Alimta  500 mg/m and Keytruda  200 mg IV every 3 weeks, started on 12/30/22.  2) Single agent Keytruda  200 mg IV every 3 weeks starting from cycle 2 on 02/18/23  Current Therapy: To be determined.   HISTORY OF PRESENT ILLNESS:   Oncology History  Non-small cell lung cancer, left (HCC)  12/19/2022 Initial Diagnosis   Adenocarcinoma of left lung, stage 4 (HCC)   12/19/2022 Cancer Staging   Staging form: Lung, AJCC 8th Edition - Clinical: Stage IVB (cT1c, cN3, cM1c) - Signed by Sherrod Sherrod, MD on 12/19/2022   12/30/2022 - 08/18/2023 Chemotherapy   Patient is on Treatment Plan : LUNG Carboplatin  (5) + Pemetrexed  (500) + Pembrolizumab  (200) D1 q21d Induction x 4 cycles / Maintenance Pemetrexed  (500) + Pembrolizumab  (200) D1 q21d         Patricia [name] is a 78 y.o. female presenting to clinic today for transfer of care of Non-small cell lung cancer at the request of Alphonsa Glendia LABOR, MD.  Patient has stage IV non-small cell lung cancer, adenocarcinoma diagnosed in January 2024 with positive KRAS G12C mutation and PD-L1 expression of 99%. She began treatment at [facility] and was initially given Carboplatin  + Alimta  + Keytruda , but was discontinued after she had a stroke with significant morbidities. She was then given Keytruda  200 mg IV every 3 weeks for 8 cycles with severe diarrhea as a side effect. She was diagnosed with colitis in July 2024 and finished 8 weeks of budesonide  with improvement in symptoms. She began Krazati  600 mg p.o. BID on 09/26/23 with side effects of nausea and vomiting. She has been treating symptoms with Zofran  30 minutes prior to taking Krazati . She was admitted to the hospital from 10/13/23 to 10/15/23 for transaminasemia likely secondary to Krazati . Adomninal US   found increased hepatic echogenicity. She has since been holding Krazati . She was admitted again to the hospital from 11/14/23 to 11/15/23 for C. diff colitis and symptoms of diarrhea and weakness. She was given IV vancomycin  with improvement in symptoms and was discharged with oral vancomycin . She was last seen by oncology on 09/29/23 and was referred to me as she wished to receive treatment here.   Today, she states that she is doing well overall. Her appetite level is at 0%. Her energy level is at 0%. She is accompanied by her husband.   She reports her diarrhea has improved, as it was previously constant while on Keytruda . She now has soft stools 2-3 times a day. Her baseline weight was at 144 pounds 1 year ago. She is at 103 pounds today. She attributes weight loss to a decreased appetite. She is mostly sitting during the day, though she is able to get up to go to the restroom.   She began Keflex  yesterday for a UTI. She is tapering Vancomycin  as well, with her last day of taking the medication 12/19/23.   She went to a stroke specialist last year who reportedly told her she had 2 separate strokes after her CVA.  She has left leg weakness and balance issues. She uses a walker at home and a wheelchair when not at home. She notes an improvement since her CVA and has completed physical therapy.   PAST MEDICAL HISTORY:   Past Medical History: Past Medical History:  Diagnosis Date   Depression    GERD (gastroesophageal reflux disease)    Hypercholesterolemia    Hypokalemia 06/23/2023   IFG (impaired fasting glucose)    Nonintractable episodic headache 01/18/2023   Osteopenia    PONV (postoperative nausea and vomiting)    Stroke [facility])     Surgical History: Past Surgical History:  Procedure Laterality Date   ABDOMINAL HYSTERECTOMY     BIOPSY  06/24/2023   Procedure: BIOPSY;  Surgeon: Eartha Angelia Sieving, MD;  Location: [facility];  Service: Gastroenterology;;   CATARACT EXTRACTION  W/PHACO  09/12/2011   Procedure: CATARACT EXTRACTION PHACO AND INTRAOCULAR LENS PLACEMENT (IOC);  Surgeon: Cherene Mania;  Location: [facility] ORS;  Service: Ophthalmology;  Laterality: Left;  CDE: 8.91   CATARACT EXTRACTION W/PHACO  11/18/2011   Procedure: CATARACT EXTRACTION PHACO AND INTRAOCULAR LENS PLACEMENT (IOC);  Surgeon: Cherene Mania;  Location: [facility] ORS;  Service: Ophthalmology;  Laterality: Right;  CDE:10.26   COLONOSCOPY  2007   Dr. Shaaron: internal hemorrhoids, single anal papilla poor prep.    COLONOSCOPY N/A 03/27/2017   Rourk: Normal exam.   COLONOSCOPY  06/24/2023   Procedure: COLONOSCOPY;  Surgeon: Eartha Angelia, Sieving, MD;  Location: [facility];  Service: Gastroenterology;;   ECTOPIC PREGNANCY SURGERY     removal of right tube   ESOPHAGOGASTRODUODENOSCOPY (EGD) WITH PROPOFOL  N/A 09/10/2021   Procedure: ESOPHAGOGASTRODUODENOSCOPY (EGD) WITH PROPOFOL ;  Surgeon: Shaaron Lamar HERO, MD;  Location: [facility];  Service: Endoscopy;  Laterality: N/A;  8:15AM   MALONEY DILATION N/A 09/10/2021   Procedure: AGAPITO DILATION;  Surgeon: Shaaron Lamar HERO, MD;  Location: [facility];  Service: Endoscopy;  Laterality: N/A;   OVARIAN CYST REMOVAL     right   RECTOCELE REPAIR N/A 01/18/2014   Procedure: POSTERIOR REPAIR (RECTOCELE);  Surgeon: Norleen LULLA Server, MD;  Location: [facility] ORS;  Service: Gynecology;  Laterality: N/A;   VAGINAL HYSTERECTOMY N/A 01/18/2014   Procedure: HYSTERECTOMY VAGINAL;  Surgeon: Norleen LULLA Server, MD;  Location: [facility] OR[facility];  Service: Gynecology;  Laterality: N/A;    Social History: Social History   Socioeconomic History   Marital status: Married    Spouse name: Not on file   Number of children: Not on file   Years of education: Not on file   Highest education level: Not on file  Occupational History   Not on file  Tobacco Use   Smoking status: Former    Current packs/day: 0.00    Average packs/day: 1 pack/day for 20.0 years (20.0 ttl pk-yrs)    Types: Cigarettes     Start date: 09/10/1964    Quit date: 09/10/1984    Years since quitting: 39.2   Smokeless tobacco: Never  Vaping Use   Vaping status: Never Used  Substance and Sexual Activity   Alcohol use: No   Drug use: No   Sexual activity: Not Currently    Birth control/protection: Post-menopausal  Other Topics Concern   Not on file  Social History Narrative   Not on file   Social Drivers of Health   Financial Resource Strain: Not on file  Food Insecurity: No Food Insecurity (11/14/2023)   Hunger Vital Sign    Worried About Running Out of  Food in the Last Year: Never true    Ran Out of Food in the Last Year: Never true  Transportation Needs: No Transportation Needs (11/14/2023)   PRAPARE - Administrator, Civil Service (Medical): No    Lack of Transportation (Non-Medical): No  Physical Activity: Not on file  Stress: Not on file  Social Connections: Unknown (04/20/2022)   Received from [facility], [facility]   Social Network    Social Network: Not on file  Intimate Partner Violence: Not At Risk (11/14/2023)   Humiliation, Afraid, Rape, and Kick questionnaire    Fear of Current or Ex-Partner: No    Emotionally Abused: No    Physically Abused: No    Sexually Abused: No    Family History: Family History  Problem Relation Age of Onset   Osteoporosis Mother    Heart attack Father    Heart attack Brother 74       MI   Breast cancer Maternal Aunt    Breast cancer Paternal Aunt    Cancer Paternal Aunt        ovarian cancer   Anesthesia problems Neg Hx    Hypotension Neg Hx    Malignant hyperthermia Neg Hx    Pseudochol deficiency Neg Hx    Colon cancer Neg Hx    Inflammatory bowel disease Neg Hx     Current Medications:  Current Outpatient Medications:    acetaminophen  (TYLENOL ) 500 MG tablet, Take 1,000 mg by mouth every 6 (six) hours as needed for mild pain (pain score 1-3)., Disp: , Rfl:    ALPRAZolam  (XANAX ) 0.5 MG tablet, Take 1 tablet (0.5 mg total) by  mouth at bedtime as needed for anxiety or sleep. TAKE (1) TABLET BY MOUTH AT BEDTIME AS NEEDED FOR SLEEP. (Patient taking differently: Take 0.5 mg by mouth at bedtime as needed for sleep. TAKE (1) TABLET BY MOUTH AT BEDTIME AS NEEDED FOR SLEEP.), Disp: 30 tablet, Rfl: 2   apixaban  (ELIQUIS ) 5 MG TABS tablet, Take 1 tablet (5 mg total) by mouth 2 (two) times daily., Disp: 60 tablet, Rfl: 1   cephALEXin  (KEFLEX ) 500 MG capsule, Take 1 capsule (500 mg total) by mouth 2 (two) times daily., Disp: 14 capsule, Rfl: 0   famotidine  (PEPCID ) 40 MG tablet, Take 1 tablet (40 mg total) by mouth daily., Disp: 30 tablet, Rfl: 5   KLOR-CON  M20 20 MEQ tablet, TAKE 1 TABLET BY MOUTH THREE TIMES A DAY, Disp: 270 tablet, Rfl: 1   lansoprazole  (PREVACID ) 30 MG capsule, Take 30 mg by mouth daily., Disp: , Rfl:    levothyroxine  (SYNTHROID ) 50 MCG tablet, TAKE 1 TABLET BY MOUTH DAILY BEFORE BREAKFAST, Disp: 90 tablet, Rfl: 1   loperamide (IMODIUM) 2 MG capsule, Take 2 mg by mouth as needed for diarrhea or loose stools., Disp: , Rfl:    ondansetron  (ZOFRAN ) 8 MG tablet, Take 0.5 tablets (4 mg total) by mouth every 8 (eight) hours as needed for nausea or vomiting., Disp: 20 tablet, Rfl: 0   rosuvastatin  (CRESTOR ) 20 MG tablet, Take 20 mg by mouth at bedtime., Disp: , Rfl:    sertraline  (ZOLOFT ) 50 MG tablet, Take 50 mg by mouth daily., Disp: , Rfl:    topiramate  (TOPAMAX ) 50 MG tablet, TAKE 1 TABLET BY MOUTH TWICE A DAY, Disp: 180 tablet, Rfl: 1   vancomycin  (VANCOCIN ) 125 MG capsule, Take 125mg  QID for 14 days, then 125mg  BID for 7 days, then 125mg  daily for 7 days, then 125mg   every 3 days for 8 weeks., Disp: 95 capsule, Rfl: 0   Allergies: Allergies  Allergen Reactions   Codeine Nausea Only    REVIEW OF SYSTEMS:   Review of Systems  Constitutional:  Negative for chills, fatigue and fever.  HENT:   Positive for trouble swallowing. Negative for lump/mass, mouth sores, nosebleeds and sore throat.   Eyes:  Negative for  eye problems.  Respiratory:  Negative for cough and shortness of breath.   Cardiovascular:  Negative for chest pain, leg swelling and palpitations.  Gastrointestinal:  Positive for diarrhea. Negative for abdominal pain, constipation, nausea and vomiting.  Genitourinary:  Positive for dysuria. Negative for bladder incontinence, difficulty urinating, frequency, hematuria and nocturia.   Musculoskeletal:  Negative for arthralgias, back pain, flank pain, myalgias and neck pain.       +rib pain, 7/10 severity  Skin:  Negative for itching and rash.  Neurological:  Positive for headaches. Negative for dizziness and numbness.  Hematological:  Does not bruise/bleed easily.  Psychiatric/Behavioral:  Positive for sleep disturbance. Negative for depression and suicidal ideas. The patient is not nervous/anxious.   All other systems reviewed and are negative.    VITALS:   Blood pressure 112/77, pulse 94, temperature 97.9 F (36.6 C), temperature source Oral, resp. rate 20, height 4' 8 (1.422 m), weight 103 lb 13.4 oz (47.1 kg), SpO2 96%.  Wt Readings from Last 3 Encounters:  12/16/23 103 lb 13.4 oz (47.1 kg)  12/15/23 101 lb 3.2 oz (45.9 kg)  11/26/23 105 lb (47.6 kg)    Body mass index is 23.28 kg/m.  Performance status (ECOG): 2 - Symptomatic, <50% confined to bed  PHYSICAL EXAM:   Physical Exam Vitals and nursing note reviewed. Exam conducted with a chaperone present.  Constitutional:      Appearance: Normal appearance.  Cardiovascular:     Rate and Rhythm: Normal rate and regular rhythm.     Pulses: Normal pulses.     Heart sounds: Normal heart sounds.  Pulmonary:     Effort: Pulmonary effort is normal.     Breath sounds: Normal breath sounds.  Abdominal:     Palpations: Abdomen is soft. There is no hepatomegaly, splenomegaly or mass.     Tenderness: There is no abdominal tenderness.  Musculoskeletal:     Right lower leg: No edema.     Left lower leg: No edema.  Lymphadenopathy:      Cervical: No cervical adenopathy.     Right cervical: No superficial, deep or posterior cervical adenopathy.    Left cervical: No superficial, deep or posterior cervical adenopathy.     Upper Body:     Right upper body: Axillary adenopathy (1 cm, freely mobile) present. No supraclavicular adenopathy.     Left upper body: No supraclavicular or axillary adenopathy.  Neurological:     General: No focal deficit present.     Mental Status: She is alert and oriented to person, place, and time.  Psychiatric:        Mood and Affect: Mood normal.        Behavior: Behavior normal.     LABS:   CBC    Component Value Date/Time   WBC 8.6 11/21/2023 1035   WBC 4.2 11/14/2023 0439   RBC 5.24 11/21/2023 1035   RBC 4.02 11/14/2023 0439   HGB 15.0 11/21/2023 1035   HCT 48.1 (H) 11/21/2023 1035   PLT 387 11/21/2023 1035   MCV 92 11/21/2023 1035   MCH 28.6 11/21/2023 1035  MCH 28.4 11/14/2023 0439   MCHC 31.2 (L) 11/21/2023 1035   MCHC 30.6 11/14/2023 0439   RDW 14.6 11/21/2023 1035   LYMPHSABS 2.7 11/21/2023 1035   MONOABS 0.7 11/13/2023 1152   EOSABS 0.1 11/21/2023 1035   BASOSABS 0.2 11/21/2023 1035    CMP    Component Value Date/Time   NA 144 11/21/2023 1035   K 5.4 (H) 11/21/2023 1035   CL 111 (H) 11/21/2023 1035   CO2 18 (L) 11/21/2023 1035   GLUCOSE 114 (H) 11/21/2023 1035   GLUCOSE 98 11/15/2023 0430   BUN 16 11/21/2023 1035   CREATININE 0.84 11/21/2023 1035   CREATININE 1.20 (H) 09/29/2023 0836   CALCIUM  9.9 11/21/2023 1035   PROT 6.0 (L) 11/13/2023 1152   PROT 7.0 08/02/2022 1112   ALBUMIN 3.3 (L) 11/13/2023 1152   ALBUMIN 4.8 08/02/2022 1112   AST 19 11/13/2023 1152   AST 13 (L) 09/29/2023 0836   ALT 24 11/13/2023 1152   ALT 13 09/29/2023 0836   ALKPHOS 107 11/13/2023 1152   BILITOT 0.9 11/13/2023 1152   BILITOT 0.3 09/29/2023 0836   GFRNONAA >60 11/15/2023 0430   GFRNONAA 47 (L) 09/29/2023 0836   GFRAA >90 01/19/2014 0530     No results found for:  CEA1, CEA / No results found for: CEA1, CEA No results found for: PSA1 No results found for: CAN199 No results found for: CAN125  No results found for: TOTALPROTELP, ALBUMINELP, A1GS, A2GS, BETS, BETA2SER, GAMS, MSPIKE, SPEI No results found for: TIBC, FERRITIN, IRONPCTSAT No results found for: LDH   STUDIES:   No results found.

## 2023-12-15 NOTE — Progress Notes (Signed)
 Subjective:  Patient ID: Patricia [name], female    DOB: 08/02/1946  Age: 78 y.o. MRN: [id number]  CC:  UTI   HPI:  78 year old female with current non-small cell lung cancer, hypertension, recent C. difficile colitis with hospitalization, failure to thrive presents with concerns for UTI.  Patient states that she has had urinary symptoms over the past week or so.  Seem to be worse today.  She reports dysuria.  She reports urinary frequency and some lower abdominal discomfort.  No fever.  No relieving factors.  Patient Active Problem List   Diagnosis Date Noted   Acute cystitis with hematuria 12/15/2023   C. difficile colitis 11/14/2023   Acute hepatitis 10/13/2023   Thrombocytopenia (HCC) 10/13/2023   Failure to thrive in adult 10/13/2023   Chronic daily headache 07/15/2023   Lymphocytic colitis 06/25/2023   Prolonged QT interval 06/23/2023   Acquired hypothyroidism 06/23/2023   History of stroke 06/23/2023   Encounter for antineoplastic chemotherapy 12/30/2022   Non-small cell lung cancer, left (HCC) 12/19/2022   Left lower lobe pulmonary nodule 11/22/2022   Hoarseness 09/07/2022   Primary hypertension 08/30/2022   Hiatal hernia 08/05/2022   Persistent depressive disorder, moderate 08/03/2022   Dysphagia 07/13/2021   Anxiety and depression 07/23/2017   Encounter for antineoplastic immunotherapy 02/19/2017   GERD without esophagitis 04/11/2014   Insomnia 04/11/2014   Rectocele 12/22/2013   Impaired fasting glucose 08/27/2013   Mixed hyperlipidemia 08/27/2013   Osteopenia 08/27/2013    Social Hx   Social History   Socioeconomic History   Marital status: Married    Spouse name: Not on file   Number of children: Not on file   Years of education: Not on file   Highest education level: Not on file  Occupational History   Not on file  Tobacco Use   Smoking status: Former    Current packs/day: 0.00    Average packs/day: 1 pack/day for 20.0 years (20.0 ttl pk-yrs)     Types: Cigarettes    Start date: 09/10/1964    Quit date: 09/10/1984    Years since quitting: 39.2   Smokeless tobacco: Never  Vaping Use   Vaping status: Never Used  Substance and Sexual Activity   Alcohol use: No   Drug use: No   Sexual activity: Not Currently    Birth control/protection: Post-menopausal  Other Topics Concern   Not on file  Social History Narrative   Not on file   Social Drivers of Health   Financial Resource Strain: Not on file  Food Insecurity: No Food Insecurity (11/14/2023)   Hunger Vital Sign    Worried About Running Out of Food in the Last Year: Never true    Ran Out of Food in the Last Year: Never true  Transportation Needs: No Transportation Needs (11/14/2023)   PRAPARE - Administrator, Civil Service (Medical): No    Lack of Transportation (Non-Medical): No  Physical Activity: Not on file  Stress: Not on file  Social Connections: Unknown (04/20/2022)   Received from [facility]   Social Network    Social Network: Not on file    Review of Systems Per HPI  Objective:  BP 104/74   Pulse 75   Temp (!) 97.3 F (36.3 C)   Ht 4' 8 (1.422 m)   Wt 101 lb 3.2 oz (45.9 kg)   SpO2 100%   BMI 22.69 kg/m      12/15/2023    3:34  PM 11/26/2023   10:46 AM 11/26/2023   10:40 AM  BP/Weight  Systolic BP 104 136 139  Diastolic BP 74 87 92  Wt. (Lbs) 101.2 105 1058  BMI 22.69 kg/m2 23.54 kg/m2 237.2 kg/m2    Physical Exam Vitals and nursing note reviewed.  Constitutional:      General: She is not in acute distress. HENT:     Head: Normocephalic and atraumatic.  Cardiovascular:     Rate and Rhythm: Normal rate and regular rhythm.  Pulmonary:     Effort: Pulmonary effort is normal. No respiratory distress.  Abdominal:     General: There is no distension.     Palpations: Abdomen is soft.     Tenderness: There is no abdominal tenderness.  Neurological:     Mental Status: She is alert.     Lab Results   Component Value Date   WBC 8.6 11/21/2023   HGB 15.0 11/21/2023   HCT 48.1 (H) 11/21/2023   PLT 387 11/21/2023   GLUCOSE 114 (H) 11/21/2023   CHOL 157 07/10/2023   TRIG 142 07/10/2023   HDL 47 07/10/2023   LDLCALC 82 07/10/2023   ALT 24 11/13/2023   AST 19 11/13/2023   NA 144 11/21/2023   K 5.4 (H) 11/21/2023   CL 111 (H) 11/21/2023   CREATININE 0.84 11/21/2023   BUN 16 11/21/2023   CO2 18 (L) 11/21/2023   TSH 2.214 10/15/2023   INR 1.5 (H) 10/14/2023   HGBA1C 6.0 (H) 01/11/2023     Assessment & Plan:   Problem List Items Addressed This Visit       Genitourinary   Acute cystitis with hematuria   History and urinalysis consistent with UTI.  Sending culture.  Placing on Keflex .        Relevant Orders   POCT URINALYSIS DIP (CLINITEK) (Completed)   Urine Culture    Meds ordered this encounter  Medications   cephALEXin  (KEFLEX ) 500 MG capsule    Sig: Take 1 capsule (500 mg total) by mouth 2 (two) times daily.    Dispense:  14 capsule    Refill:  0    Follow-up:  Return if symptoms worsen or fail to improve.  Jacqulyn Ahle DO [facility]

## 2023-12-16 ENCOUNTER — Ambulatory Visit: Payer: Medicare HMO | Admitting: Gastroenterology

## 2023-12-16 ENCOUNTER — Encounter: Payer: Self-pay | Admitting: Hematology

## 2023-12-16 ENCOUNTER — Inpatient Hospital Stay: Payer: Medicare HMO | Attending: Internal Medicine | Admitting: Hematology

## 2023-12-16 VITALS — BP 112/77 | HR 94 | Temp 97.9°F | Resp 20 | Ht <= 58 in | Wt 103.8 lb

## 2023-12-16 DIAGNOSIS — Z8041 Family history of malignant neoplasm of ovary: Secondary | ICD-10-CM | POA: Insufficient documentation

## 2023-12-16 DIAGNOSIS — Z801 Family history of malignant neoplasm of trachea, bronchus and lung: Secondary | ICD-10-CM | POA: Insufficient documentation

## 2023-12-16 DIAGNOSIS — Z803 Family history of malignant neoplasm of breast: Secondary | ICD-10-CM | POA: Diagnosis not present

## 2023-12-16 DIAGNOSIS — C3492 Malignant neoplasm of unspecified part of left bronchus or lung: Secondary | ICD-10-CM | POA: Insufficient documentation

## 2023-12-16 DIAGNOSIS — N390 Urinary tract infection, site not specified: Secondary | ICD-10-CM | POA: Insufficient documentation

## 2023-12-16 DIAGNOSIS — Z8673 Personal history of transient ischemic attack (TIA), and cerebral infarction without residual deficits: Secondary | ICD-10-CM | POA: Insufficient documentation

## 2023-12-16 DIAGNOSIS — A0472 Enterocolitis due to Clostridium difficile, not specified as recurrent: Secondary | ICD-10-CM | POA: Diagnosis not present

## 2023-12-16 DIAGNOSIS — Z87891 Personal history of nicotine dependence: Secondary | ICD-10-CM | POA: Insufficient documentation

## 2023-12-16 DIAGNOSIS — K52832 Lymphocytic colitis: Secondary | ICD-10-CM | POA: Diagnosis not present

## 2023-12-16 NOTE — Patient Instructions (Addendum)
 [facility]  Discharge Instructions  You were seen and examined today by Dr. Rogers. Dr. Katragadda is a medical oncologist, meaning that he specializes in the treatment of cancer diagnoses. Dr. Rogers discussed your past medical history, family history of cancers, and the events that led to you being here today.  You were referred to Dr. Katragadda for ongoing management of your lung cancer.  For your type of cancer, the best course of action is to continue immunotherapy if needed. Dr. Katragadda will repeat a PET scan. If you have had a good response to therapy, we will continue to monitor you without treatment. If you need continued treatment, Dr. Katragadda will discuss the option for a different immunotherapy.  Follow-up as scheduled.  Thank you for choosing [facility] to provide your oncology and hematology care.   To afford each patient quality time with our provider, please arrive at least 15 minutes before your scheduled appointment time. You may need to reschedule your appointment if you arrive late (10 or more minutes). Arriving late affects you and other patients whose appointments are after yours.  Also, if you miss three or more appointments without notifying the office, you may be dismissed from the clinic at the provider's discretion.    Again, thank you for choosing [facility].  Our hope is that these requests will decrease the amount of time that you wait before being seen by our physicians.   If you have a lab appointment with the Cancer Center - please note that after April 8th, all labs will be drawn in the cancer center.  You do not have to check in or register with the main entrance as you have in the past but will complete your check-in at the cancer center.            _____________________________________________________________  Should you have questions after your visit to [facility], please contact our office at [phone] and follow the prompts.  Our office hours are 8:00 a.m. to 4:30 p.m. Monday - Thursday and 8:00 a.m. to 2:30 p.m. Friday.  Please note that voicemails left after 4:00 p.m. may not be returned until the following business day.  We are closed weekends and all major holidays.  You do have access to a nurse 24-7, just call the main number to the clinic [phone] and do not press any options, hold on the line and a nurse will answer the phone.    For prescription refill requests, have your pharmacy contact our office and allow 72 hours.    Masks are no longer required in the cancer centers. If you would like for your care team to wear a mask while they are taking care of you, please let them know. You may have one support person who is at least 76 years old accompany you for your appointments.

## 2023-12-17 LAB — URINE CULTURE

## 2023-12-18 ENCOUNTER — Ambulatory Visit: Payer: Medicare HMO | Admitting: Family Medicine

## 2023-12-21 NOTE — Progress Notes (Signed)
 GI Office Note    Referring Provider: Alphonsa Glendia LABOR, MD Primary Care Physician:  Alphonsa Glendia LABOR, MD  Primary Gastroenterologist: Ozell Hollingshead, MD   Chief Complaint   Chief Complaint  Patient presents with   Follow-up    Having some occasional abd pain    History of Present Illness   Patricia [name] is a 78 y.o. female presenting today for follow up. Last seen 11/2023. She has history of GERD, diarrhea. Diagnosed with lymphocytic colitis in 06/2023. Treated for Cdiff in 11/2023 and then treated for suspected recurrence shortly thereafter with vancomycin  taper.  Today: Currently on vancomycin  BID, going to one a day on Friday. Just finished keflex  for UTI, does not feel her UTI has resolved. Unfortunately her urine culture was cancelled due to specimen not being refrigerated. Continues to have dysuria, suprapubic pain. BM 3-4 times per day now, mostly in mornings. No nocturnal stools. No melena, brbpr. Reflux well controlled. States she cannot go without lansoprazole  due to really bad symptoms off medication. She does not think she is on famotidine  right now but they will confirm at home. Appetite remains poor. She denies postprandial nausea/vomiting,abdominal pain.   Pertinent history:  Hospitalized back in July with colitis/diarrhea.  CT with findings consistent with colitis from splenic flexure to the level of the rectum.  Underwent a colonoscopy which showed friability with contact bleeding in the ascending colon and in the cecum, pathology showed lymphocytic colitis.  C. difficile testing negative. She was given budesonide  9 mg daily for 8 weeks.  Diarrhea resolved.  CT with contrast in 09/2023 with normal colon.   Admission in November with failure to thrive, nausea/vomiting/diarrhea in the setting of new chemotherapy drug adagrasib . Found to have transaminitis/acute hepatitis likely secondary to adagrasib .    Readmitted to the hospital in 11/2023, CT abdomen pelvis  revealed pancolitis.  C. difficile antigen positive, toxin negative but PCR was positive.  Initially was started on Dificid  but the cost was too high and she was switched over to oral vancomycin .  Completing a 10-day course.      Colonoscopy July 2024: -Friability with contact bleeding in the ascending colon and in the cecum pathology consistent with lymphocytic colitis -Distal rectum and anal verge normal   EGD October 2022: -Normal esophagus -Medium sized hiatal hernia -Normal stomach -Normal duodenal bulb and second portion of duodenum   Wt Readings from Last 10 Encounters:  12/22/23 104 lb (47.2 kg)  12/16/23 103 lb 13.4 oz (47.1 kg)  12/15/23 101 lb 3.2 oz (45.9 kg)  11/26/23 105 lb (47.6 kg)  11/21/23 103 lb 6.4 oz (46.9 kg)  11/13/23 100 lb (45.4 kg)  10/28/23 102 lb 4.8 oz (46.4 kg)  10/22/23 104 lb (47.2 kg)  10/13/23 107 lb 12.9 oz (48.9 kg)  09/29/23 113 lb 6.4 oz (51.4 kg)    Medications   Current Outpatient Medications  Medication Sig Dispense Refill   acetaminophen  (TYLENOL ) 500 MG tablet Take 1,000 mg by mouth every 6 (six) hours as needed for mild pain (pain score 1-3).     ALPRAZolam  (XANAX ) 0.5 MG tablet Take 1 tablet (0.5 mg total) by mouth at bedtime as needed for anxiety or sleep. TAKE (1) TABLET BY MOUTH AT BEDTIME AS NEEDED FOR SLEEP. (Patient taking differently: Take 0.5 mg by mouth at bedtime as needed for sleep. TAKE (1) TABLET BY MOUTH AT BEDTIME AS NEEDED FOR SLEEP.) 30 tablet 2   apixaban  (ELIQUIS ) 5 MG TABS tablet Take  1 tablet (5 mg total) by mouth 2 (two) times daily. 60 tablet 1   cephALEXin  (KEFLEX ) 500 MG capsule Take 1 capsule (500 mg total) by mouth 2 (two) times daily. 14 capsule 0   famotidine  (PEPCID ) 40 MG tablet Take 1 tablet (40 mg total) by mouth daily. 30 tablet 5   KLOR-CON  M20 20 MEQ tablet TAKE 1 TABLET BY MOUTH THREE TIMES A DAY 270 tablet 1   lansoprazole  (PREVACID ) 30 MG capsule Take 30 mg by mouth daily.     levothyroxine   (SYNTHROID ) 50 MCG tablet TAKE 1 TABLET BY MOUTH DAILY BEFORE BREAKFAST 90 tablet 1   loperamide (IMODIUM) 2 MG capsule Take 2 mg by mouth as needed for diarrhea or loose stools.     ondansetron  (ZOFRAN ) 8 MG tablet Take 0.5 tablets (4 mg total) by mouth every 8 (eight) hours as needed for nausea or vomiting. 20 tablet 0   rosuvastatin  (CRESTOR ) 20 MG tablet Take 20 mg by mouth at bedtime.     sertraline  (ZOLOFT ) 50 MG tablet Take 50 mg by mouth daily.     topiramate  (TOPAMAX ) 50 MG tablet TAKE 1 TABLET BY MOUTH TWICE A DAY 180 tablet 1   vancomycin  (VANCOCIN ) 125 MG capsule Take 125 mg by mouth 4 (four) times daily. Take 125mg  QID for 14 days, then 125mg  BID for 7 days, then 125mg  daily for 7 days, then 125mg  every 3 days for 8 weeks     No current facility-administered medications for this visit.    Allergies   Allergies as of 12/22/2023 - Review Complete 12/22/2023  Allergen Reaction Noted   Codeine Nausea Only 09/11/2011        Review of Systems   General: Negative for  fever, chills, fatigue, weakness. See hpi ENT: Negative for hoarseness, difficulty swallowing , nasal congestion. CV: Negative for chest pain, angina, palpitations, dyspnea on exertion, peripheral edema.  Respiratory: Negative for dyspnea at rest, dyspnea on exertion, cough, sputum, wheezing.  GI: See history of present illness. GU:  Negative for dysuria, hematuria, urinary incontinence, urinary frequency, nocturnal urination.  Endo: Negative for unusual weight change.     Physical Exam   BP 120/81 (BP Location: Right Arm, Patient Position: Sitting, Cuff Size: Normal)   Pulse 78   Temp 98.3 F (36.8 C) (Oral)   Ht 4' 8 (1.422 m)   Wt 104 lb (47.2 kg)   SpO2 97%   BMI 23.32 kg/m    General: Well-nourished, well-developed in no acute distress.  Eyes: No icterus. Mouth: Oropharyngeal mucosa moist and pink   Lungs: Clear to auscultation bilaterally.  Heart: Regular rate and rhythm, no murmurs rubs or  gallops.  Abdomen: Bowel sounds are normal, nontender, nondistended, no hepatosplenomegaly or masses,  no abdominal bruits or hernia , no rebound or guarding.  Rectal: not performed  Extremities: No lower extremity edema. No clubbing or deformities. Neuro: Alert and oriented x 4   Skin: Warm and dry, no jaundice.   Psych: Alert and cooperative, normal mood and affect.  Labs   Lab Results  Component Value Date   NA 144 11/21/2023   CL 111 (H) 11/21/2023   K 5.4 (H) 11/21/2023   CO2 18 (L) 11/21/2023   BUN 16 11/21/2023   CREATININE 0.84 11/21/2023   EGFR 72 11/21/2023   CALCIUM  9.9 11/21/2023   PHOS 2.5 06/23/2023   ALBUMIN 3.3 (L) 11/13/2023   GLUCOSE 114 (H) 11/21/2023   Lab Results  Component Value Date  ALT 24 11/13/2023   AST 19 11/13/2023   ALKPHOS 107 11/13/2023   BILITOT 0.9 11/13/2023   Lab Results  Component Value Date   WBC 8.6 11/21/2023   HGB 15.0 11/21/2023   HCT 48.1 (H) 11/21/2023   MCV 92 11/21/2023   PLT 387 11/21/2023    Imaging Studies   No results found.  Assessment/Plan:   Diarrhea:  -lymphocytic colitis diagnosed in 06/2023, treated with budesonide  with resolultion of diarrhea, per oncology, not entirely clear if pembrolizumab  induced colitis as her colonoscopy and biopsy showed lymphocytic colitis. Immunotherapy induced colitis usually has neutrophilic colitis.  -recurrence of diarrhea 11/2023, Cdiff positive. Treated with vancomycin . -call with recurrent diarrhea off vancomycin , started on vancomycin  taper, currently on BID dosing -recent rx for keflex  for UTI, persistent dysuria, will obtain culture to tailor antibiotic if needed -diarrhea much improved, stools soft, mostly morning only, continue to monitor   GERD: -controlled on lansoprazole  30mg  am, she cannot go without, states her symptoms are severe without PPI. -if not on famotidine , would stay off. If she is on famotidine , she can try coming off if tolerated. Looking for  lowest effective dose to control her reflex  Dysuria: persistent after 7 day course of Keflex . Urine culture was not completed, cancelled due to lack of refrigeration of specimen. -U/A with culture      Sonny RAMAN. Ezzard, MHS, PA-C [facility]

## 2023-12-22 ENCOUNTER — Ambulatory Visit: Payer: Medicare HMO | Admitting: Gastroenterology

## 2023-12-22 ENCOUNTER — Encounter: Payer: Self-pay | Admitting: Gastroenterology

## 2023-12-22 VITALS — BP 120/81 | HR 78 | Temp 98.3°F | Ht <= 58 in | Wt 104.0 lb

## 2023-12-22 DIAGNOSIS — R300 Dysuria: Secondary | ICD-10-CM | POA: Diagnosis not present

## 2023-12-22 DIAGNOSIS — A0472 Enterocolitis due to Clostridium difficile, not specified as recurrent: Secondary | ICD-10-CM | POA: Diagnosis not present

## 2023-12-22 DIAGNOSIS — K219 Gastro-esophageal reflux disease without esophagitis: Secondary | ICD-10-CM

## 2023-12-22 NOTE — Patient Instructions (Addendum)
 Please go to [facility] beside Dr. Debara. Cook's office to complete urine test.   Complete vancomycin  as planned. Please call if you have recurrent loose/watery stools or more frequent stools.  Continue lansoprazole  30mg  daily before breakfast. You can try to stop famotidine  if you are still on it. If reflux symptoms worsen, you can resume if needed. Trying to get you on the lowest dose acid suppression medication that controls your symptoms.

## 2023-12-23 LAB — MICROSCOPIC EXAMINATION
Bacteria, UA: NONE SEEN
Casts: NONE SEEN /[LPF]
RBC, Urine: NONE SEEN /[HPF] (ref 0–2)
WBC, UA: >30 /[HPF] — AB (ref 0–5)

## 2023-12-23 LAB — URINALYSIS, ROUTINE W REFLEX MICROSCOPIC
Bilirubin, UA: NEGATIVE
Glucose, UA: NEGATIVE
Ketones, UA: NEGATIVE
Nitrite, UA: POSITIVE — AB
Specific Gravity, UA: 1.016 (ref 1.005–1.030)
Urobilinogen, Ur: 1 mg/dL (ref 0.2–1.0)
pH, UA: 5.5 (ref 5.0–7.5)

## 2023-12-25 ENCOUNTER — Other Ambulatory Visit (HOSPITAL_COMMUNITY): Payer: Medicare HMO

## 2023-12-25 ENCOUNTER — Ambulatory Visit: Payer: Self-pay | Admitting: Family Medicine

## 2023-12-25 ENCOUNTER — Ambulatory Visit (INDEPENDENT_AMBULATORY_CARE_PROVIDER_SITE_OTHER): Payer: Medicare HMO | Admitting: Nurse Practitioner

## 2023-12-25 ENCOUNTER — Telehealth: Payer: Self-pay

## 2023-12-25 VITALS — BP 109/75 | HR 83 | Temp 98.2°F | Ht <= 58 in | Wt 100.2 lb

## 2023-12-25 DIAGNOSIS — F419 Anxiety disorder, unspecified: Secondary | ICD-10-CM | POA: Diagnosis not present

## 2023-12-25 DIAGNOSIS — N390 Urinary tract infection, site not specified: Secondary | ICD-10-CM | POA: Diagnosis not present

## 2023-12-25 DIAGNOSIS — F32A Depression, unspecified: Secondary | ICD-10-CM

## 2023-12-25 MED ORDER — SULFAMETHOXAZOLE-TRIMETHOPRIM 800-160 MG PO TABS: 1.0000 | ORAL_TABLET | Freq: Two times a day (BID) | ORAL | 0 refills | Status: DC

## 2023-12-25 NOTE — Telephone Encounter (Signed)
She was having 3-4 stools a day, mostly in the mornings when I saw her earlier this week. Also just completing keflex.  How many stools per day, what is consistency?   Please have her continue vanc BID for another week and then continue taper after that.  Let them know we are waiting on final urine culture results.  Have then keep diarrhea log, I need to know number of stools, consistency.

## 2023-12-25 NOTE — Telephone Encounter (Signed)
Pt's husband called stating that the pt's diarrhea has returned. Pt is starting the one a day dosing of vancomycin. Spouse is wanting to know if she needs to do anything different.

## 2023-12-25 NOTE — Telephone Encounter (Signed)
Lmom for return call.  

## 2023-12-25 NOTE — Telephone Encounter (Signed)
Spouse states that she is having 5 a day and they are watery.

## 2023-12-25 NOTE — Patient Instructions (Signed)
Take 1 1/2 Tablet daily Sertraline 50 mg (75 mg)

## 2023-12-25 NOTE — Telephone Encounter (Signed)
  Chief Complaint: continued UTI symptoms Symptoms: urinary frequency and pain with urination Frequency: symptoms have been going on for a couple of weeks Pertinent Negatives: Patient denies fever Disposition: [] ED /[] Urgent Care (no appt availability in office) / [x] Appointment(In office/virtual)/ []  [facility]/ [] Home Care/ [] Refused Recommended Disposition /[] [facility]/ []  Follow-up with PCP Additional Notes: patient's husband called to notify that patient had finished her Keflex but symptoms have not gone away. Patient has been feeling somewhat better but not improved greatly. Per protocol, appointment made for 2:30 pm today at PCP for reevaluation. Patient's husband verbalized understanding and will get the patient to the appointment today. All questions answered.    Copied from CRM [id number]. Topic: Clinical - Medical Advice >> Dec 25, 2023  9:44 AM Fuller Mandril wrote: Reason for CRM: Patient husband called states she finished cephALEXin (KEFLEX) 500 MG capsule but it is not working. She is only a little better. Thank You Reason for Disposition  Urinating more frequently than usual (i.e., frequency)  Answer Assessment - Initial Assessment Questions 1. SYMPTOM: "What's the main symptom you're concerned about?" (e.g., frequency, incontinence)     Urinary frequency 2. ONSET: "When did the  urinary frequency  start?"     Couple of weeks  3. PAIN: "Is there any pain?" If Yes, ask: "How bad is it?" (Scale: 1-10; mild, moderate, severe)     moderate 4. CAUSE: "What do you think is causing the symptoms?"     Probable UTI 5. OTHER SYMPTOMS: "Do you have any other symptoms?" (e.g., blood in urine, fever, flank pain, pain with urination)     Pain with urination  Protocols used: Urinary Symptoms-A-AH

## 2023-12-25 NOTE — Progress Notes (Signed)
Subjective:    Patient ID: Patricia [name], female    DOB: [birth year], 78 y.o.   MRN: [id number]  HPI Presents for recheck of her urinary symptoms.  Was given a course of Keflex with no relief.  Her current urine culture sensitivity is still pending.  No fevers.  Mild low back pain.  No flank pain.  Complains of intense burning with urination.  Significant urgency and frequency.  Taking fluids well.  See previous notes 12/15/2023 and 12/22/2023.  Has been taking AZO once daily for her symptoms.  Also of note patient is seeing the local oncologist for her non-small cell lung cancer.  States this has been going very well for her. Husband is present with her today.  Has noticed an increase in her depression symptoms.  Denies suicidal thoughts or ideation.  Review of Systems  Constitutional:  Positive for fatigue. Negative for fever.  Respiratory:  Negative for cough, chest tightness and shortness of breath.   Cardiovascular:  Negative for chest pain.  Genitourinary:  Positive for dysuria, frequency and urgency. Negative for flank pain and hematuria.      12/25/2023    2:34 PM  Depression screen PHQ 2/9  Decreased Interest 3  Down, Depressed, Hopeless 3  PHQ - 2 Score 6  Altered sleeping 3  Tired, decreased energy 3  Change in appetite 3  Feeling bad or failure about yourself  3  Trouble concentrating 3  Moving slowly or fidgety/restless 3  Suicidal thoughts 0  PHQ-9 Score 24  Difficult doing work/chores Somewhat difficult      12/25/2023    2:34 PM 12/15/2023    3:49 PM 11/21/2023    9:23 AM 10/22/2023   10:29 AM  GAD 7 : Generalized Anxiety Score  Nervous, Anxious, on Edge 3 2 0 0  Control/stop worrying 3 2 0 0  Worry too much - different things 3 2 0 0  Trouble relaxing 3 2 0 0  Restless 0 0 0 0  Easily annoyed or irritable 2 2 0 0  Afraid - awful might happen 0 0 0 0  Total GAD 7 Score 14 10 0 0  Anxiety Difficulty Somewhat difficult Somewhat difficult  Not difficult at all          Objective:   Physical Exam NAD.  Alert, oriented.  Lungs clear.  Heart regular rate rhythm.  No CVA tenderness to percussion.  Abdomen soft nondistended nontender. Today's Vitals   12/25/23 1422  BP: 109/75  Pulse: 83  Temp: 98.2 F (36.8 C)  SpO2: 98%  Weight: 100 lb 3.2 oz (45.5 kg)  Height: 4\' 8"  (1.422 m)   Body mass index is 22.46 kg/m.  Component Ref Range & Units (hover) 4 d ago  Urine Culture, Routine Preliminary report Abnormal  P  Organism ID, Bacteria Gram negative rods Abnormal  P  Comment: 10,000-25,000 colony forming units per mL  Resulting Agency [email or web address]       Assessment & Plan:   Problem List Items Addressed This Visit       Genitourinary   Urinary tract infection in elderly patient - Primary   Relevant Medications   sulfamethoxazole-trimethoprim (BACTRIM DS) 800-160 MG tablet     Other   Anxiety and depression   Meds ordered this encounter  Medications   sulfamethoxazole-trimethoprim (BACTRIM DS) 800-160 MG tablet    Sig: Take 1 tablet by mouth 2 (two) times daily.    Dispense:  14 tablet    Refill:  0    Supervising Provider:   [name] [phone]   Because of the severity of her symptoms and final sensitivity report is still pending, we will go ahead and start Bactrim DS as directed.  Patient understands that we may have to change this once her report comes back.  Warning signs reviewed.  Patient to go to ED or urgent care over the weekend if worse.  Call back next week if no improvement.  May use AZO as directed for 48 hours then discontinue. Increase sertraline 50 mg to 1-1/2 tabs daily (75 mg).  Patient already has the 50 mg dosing at home.  Again reviewed potential side effects.  Call back if any problems. Follow-up with Dr. Lorin Picket on 1/24 as planned.

## 2023-12-25 NOTE — Telephone Encounter (Signed)
Ok instructions as outlined below. If continues to have watery stool beyond Friday, then increase vanc to TID through the weekend. Needs to call with progress report on Monday.   Need to document number of stools daily, consistency. Difficult situation with both diagnosis of lymphocytic colitis, recent cdiff plus potential for side effects from medication.   I also see that she was started on bactrim today by her PCP for UTI which could also further loosen her stool.   Would advise against use of imodium at this time.   Avoid dairy, raw veggies.

## 2023-12-25 NOTE — Telephone Encounter (Signed)
Patient scheduled for office visit with you this afternoon

## 2023-12-26 ENCOUNTER — Encounter: Payer: Self-pay | Admitting: Nurse Practitioner

## 2023-12-26 DIAGNOSIS — N390 Urinary tract infection, site not specified: Secondary | ICD-10-CM | POA: Insufficient documentation

## 2023-12-26 LAB — URINE CULTURE

## 2023-12-26 NOTE — Telephone Encounter (Signed)
Pt's husband was made aware and verbalized understanding. Husband states that so far today her stools are just soft and not watery.

## 2023-12-30 NOTE — Telephone Encounter (Signed)
Ok let's have her follow up in the office in 4 weeks, call sooner if diarrhea is an issues. Continue with vanc taper.

## 2023-12-30 NOTE — Telephone Encounter (Signed)
Pt was made aware and appt was scheduled

## 2023-12-31 ENCOUNTER — Other Ambulatory Visit: Payer: Self-pay | Admitting: Family Medicine

## 2023-12-31 ENCOUNTER — Other Ambulatory Visit: Payer: Self-pay | Admitting: Internal Medicine

## 2024-01-01 ENCOUNTER — Inpatient Hospital Stay: Payer: Medicare HMO | Admitting: Hematology

## 2024-01-01 ENCOUNTER — Other Ambulatory Visit: Payer: Self-pay | Admitting: Family Medicine

## 2024-01-01 ENCOUNTER — Other Ambulatory Visit: Payer: Self-pay

## 2024-01-01 ENCOUNTER — Telehealth: Payer: Self-pay | Admitting: *Deleted

## 2024-01-01 MED ORDER — CEPHALEXIN 500 MG PO CAPS: 500.0000 mg | ORAL_CAPSULE | Freq: Three times a day (TID) | ORAL | 0 refills | Status: AC

## 2024-01-01 NOTE — Telephone Encounter (Signed)
Spoke with pt and informed per provider orders and recommendations. Sent to pharmacy.

## 2024-01-01 NOTE — Telephone Encounter (Signed)
Copied from CRM [id number]. Topic: Clinical - Prescription Issue >> Jan 01, 2024  1:58 PM Monisha R wrote: Reason for CRM: Patient may need to get new script for UTI can contact patient as soon as possible due to no more medication is in possession.

## 2024-01-01 NOTE — Telephone Encounter (Signed)
I would recommend cephalexin 500 mg 1 taken 3 times daily for 5 days  If she has ongoing symptoms despite that then she would need to be reseen to recheck the urine thank you

## 2024-01-02 ENCOUNTER — Other Ambulatory Visit: Payer: Self-pay | Admitting: Family Medicine

## 2024-01-02 ENCOUNTER — Ambulatory Visit: Payer: Medicare HMO | Admitting: Family Medicine

## 2024-01-05 ENCOUNTER — Encounter (HOSPITAL_COMMUNITY)
Admission: RE | Admit: 2024-01-05 | Discharge: 2024-01-05 | Disposition: A | Discharge status: Home / Self Care | Payer: Medicare HMO | Source: Ambulatory Visit | Attending: Hematology | Admitting: Hematology

## 2024-01-05 DIAGNOSIS — C3432 Malignant neoplasm of lower lobe, left bronchus or lung: Secondary | ICD-10-CM | POA: Diagnosis not present

## 2024-01-05 DIAGNOSIS — C3492 Malignant neoplasm of unspecified part of left bronchus or lung: Secondary | ICD-10-CM | POA: Diagnosis not present

## 2024-01-05 LAB — GLUCOSE, CAPILLARY: Glucose-Capillary: 106 mg/dL — ABNORMAL HIGH (ref 70–99)

## 2024-01-05 MED ORDER — FLUDEOXYGLUCOSE F - 18 (FDG) INJECTION
5.0000 | Freq: Once | INTRAVENOUS | Status: AC
  Administered 2024-01-05: 5.24 via INTRAVENOUS

## 2024-01-08 ENCOUNTER — Ambulatory Visit (INDEPENDENT_AMBULATORY_CARE_PROVIDER_SITE_OTHER): Payer: Medicare HMO | Admitting: Family Medicine

## 2024-01-08 VITALS — BP 104/82 | HR 78 | Temp 97.7°F | Ht <= 58 in | Wt 103.0 lb

## 2024-01-08 DIAGNOSIS — N3001 Acute cystitis with hematuria: Secondary | ICD-10-CM

## 2024-01-08 DIAGNOSIS — R300 Dysuria: Secondary | ICD-10-CM | POA: Diagnosis not present

## 2024-01-08 LAB — POCT URINALYSIS DIP (CLINITEK)
Bilirubin, UA: NEGATIVE
Glucose, UA: NEGATIVE mg/dL
Ketones, POC UA: NEGATIVE mg/dL
Nitrite, UA: POSITIVE — AB
POC PROTEIN,UA: >=300 — AB
Spec Grav, UA: >=1.03 — AB (ref 1.010–1.025)
Urobilinogen, UA: 0.2 U/dL
pH, UA: 6 (ref 5.0–8.0)

## 2024-01-08 MED ORDER — HYDROCODONE-ACETAMINOPHEN 5-325 MG PO TABS: 1.0000 | ORAL_TABLET | Freq: Three times a day (TID) | ORAL | 0 refills | Status: DC | PRN

## 2024-01-08 MED ORDER — CIPROFLOXACIN HCL 500 MG PO TABS: 500.0000 mg | ORAL_TABLET | Freq: Two times a day (BID) | ORAL | 0 refills | Status: DC

## 2024-01-08 NOTE — Patient Instructions (Addendum)
Antibiotic as prescribed.  Pain medication as needed.  We will be in touch regarding the culture.

## 2024-01-09 NOTE — Progress Notes (Signed)
Subjective:  Patient ID: Patricia [name], female    DOB: [birth year]  Age: 78 y.o. MRN: [id number]  CC:   Chief Complaint  Patient presents with   Flank Pain   Dysuria    HPI:  77 year old female presents for evaluation of the above.  Patient has a history of recurrent UTI.  Has been ongoing for the past month.  She has been treated twice for UTI recently.  She reports symptoms worsening over the past week.  She reports dysuria as well as bilateral low back pain.  No fever.  No relieving factors.  She states that her pain is severe.  Patient Active Problem List   Diagnosis Date Noted   Acute cystitis with hematuria 12/15/2023   C. difficile colitis 11/14/2023   Acute hepatitis 10/13/2023   Thrombocytopenia (HCC) 10/13/2023   Failure to thrive in adult 10/13/2023   Chronic daily headache 07/15/2023   Lymphocytic colitis 06/25/2023   Prolonged QT interval 06/23/2023   Acquired hypothyroidism 06/23/2023   History of stroke 06/23/2023   Encounter for antineoplastic chemotherapy 12/30/2022   Non-small cell lung cancer, left (HCC) 12/19/2022   Left lower lobe pulmonary nodule 11/22/2022   Hoarseness 09/07/2022   Primary hypertension 08/30/2022   Hiatal hernia 08/05/2022   Persistent depressive disorder, moderate 08/03/2022   Anxiety and depression 07/23/2017   Encounter for antineoplastic immunotherapy 02/19/2017   GERD without esophagitis 04/11/2014   Insomnia 04/11/2014   Rectocele 12/22/2013   Impaired fasting glucose 08/27/2013   Mixed hyperlipidemia 08/27/2013   Osteopenia 08/27/2013    Social Hx   Social History   Socioeconomic History   Marital status: Married    Spouse name: Not on file   Number of children: Not on file   Years of education: Not on file   Highest education level: Not on file  Occupational History   Not on file  Tobacco Use   Smoking status: Former    Current packs/day: 0.00    Average packs/day: 1 pack/day for 20.0 years (20.0 ttl pk-yrs)     Types: Cigarettes    Start date: 09/10/1964    Quit date: 09/10/1984    Years since quitting: 39.3   Smokeless tobacco: Never  Vaping Use   Vaping status: Never Used  Substance and Sexual Activity   Alcohol use: No   Drug use: No   Sexual activity: Not Currently    Birth control/protection: Post-menopausal  Other Topics Concern   Not on file  Social History Narrative   Not on file   Social Drivers of Health   Financial Resource Strain: Not on file  Food Insecurity: No Food Insecurity (11/14/2023)   Hunger Vital Sign    Worried About Running Out of Food in the Last Year: Never true    Ran Out of Food in the Last Year: Never true  Transportation Needs: No Transportation Needs (11/14/2023)   PRAPARE - Administrator, Civil Service (Medical): No    Lack of Transportation (Non-Medical): No  Physical Activity: Not on file  Stress: Not on file  Social Connections: Unknown (04/20/2022)   Received from [facility]   Social Network    Social Network: Not on file    Review of Systems Per HPI  Objective:  BP 104/82   Pulse 78   Temp 97.7 F (36.5 C)   Ht 4\' 8"  (1.422 m)   Wt 103 lb (46.7 kg)   SpO2 97%   BMI 23.09 kg/m  01/08/2024    3:21 PM 12/25/2023    2:22 PM 12/22/2023   11:18 AM  BP/Weight  Systolic BP 104 109 120  Diastolic BP 82 75 81  Wt. (Lbs) 103 100.2 104  BMI 23.09 kg/m2 22.46 kg/m2 23.32 kg/m2    Physical Exam Vitals and nursing note reviewed.  Constitutional:      Comments: Frail elderly female in no acute distress.  HENT:     Head: Normocephalic and atraumatic.  Cardiovascular:     Rate and Rhythm: Normal rate and regular rhythm.  Pulmonary:     Effort: Pulmonary effort is normal. No respiratory distress.  Abdominal:     General: Bowel sounds are normal. There is no distension.     Palpations: Abdomen is soft.     Tenderness: There is no abdominal tenderness. There is no right CVA tenderness or left CVA  tenderness.     Lab Results  Component Value Date   WBC 8.6 11/21/2023   HGB 15.0 11/21/2023   HCT 48.1 (H) 11/21/2023   PLT 387 11/21/2023   GLUCOSE 114 (H) 11/21/2023   CHOL 157 07/10/2023   TRIG 142 07/10/2023   HDL 47 07/10/2023   LDLCALC 82 07/10/2023   ALT 24 11/13/2023   AST 19 11/13/2023   NA 144 11/21/2023   K 5.4 (H) 11/21/2023   CL 111 (H) 11/21/2023   CREATININE 0.84 11/21/2023   BUN 16 11/21/2023   CO2 18 (L) 11/21/2023   TSH 2.214 10/15/2023   INR 1.5 (H) 10/14/2023   HGBA1C 6.0 (H) 01/11/2023     Assessment & Plan:   Problem List Items Addressed This Visit       Genitourinary   Acute cystitis with hematuria   Given recurrence as well as complaints of back pain, placing on Cipro to cover for the possibility of pyelonephritis.  Norco as needed for severe pain.  Awaiting culture.        Other   RESOLVED: Dysuria - Primary   Relevant Orders   POCT URINALYSIS DIP (CLINITEK) (Completed)   Urine Culture    Meds ordered this encounter  Medications   ciprofloxacin (CIPRO) 500 MG tablet    Sig: Take 1 tablet (500 mg total) by mouth 2 (two) times daily.    Dispense:  14 tablet    Refill:  0   HYDROcodone-acetaminophen (NORCO/VICODIN) 5-325 MG tablet    Sig: Take 1 tablet by mouth every 8 (eight) hours as needed for moderate pain (pain score 4-6) or severe pain (pain score 7-10).    Dispense:  15 tablet    Refill:  0    Follow-up:  Return if symptoms worsen or fail to improve.  Everlene Other DO [facility]

## 2024-01-09 NOTE — Assessment & Plan Note (Signed)
Given recurrence as well as complaints of back pain, placing on Cipro to cover for the possibility of pyelonephritis.  Norco as needed for severe pain.  Awaiting culture.

## 2024-01-12 ENCOUNTER — Other Ambulatory Visit: Payer: Self-pay | Admitting: Family Medicine

## 2024-01-13 ENCOUNTER — Other Ambulatory Visit: Payer: Self-pay

## 2024-01-13 ENCOUNTER — Encounter (HOSPITAL_COMMUNITY): Payer: Self-pay

## 2024-01-13 ENCOUNTER — Ambulatory Visit: Payer: Self-pay | Admitting: Family Medicine

## 2024-01-13 ENCOUNTER — Emergency Department (HOSPITAL_COMMUNITY)
Admission: EM | Admit: 2024-01-13 | Discharge: 2024-01-13 | Disposition: A | Discharge status: Home / Self Care | Payer: Medicare HMO | Attending: Emergency Medicine | Admitting: Emergency Medicine

## 2024-01-13 ENCOUNTER — Emergency Department (HOSPITAL_COMMUNITY): Payer: Medicare HMO

## 2024-01-13 DIAGNOSIS — R0689 Other abnormalities of breathing: Secondary | ICD-10-CM | POA: Diagnosis not present

## 2024-01-13 DIAGNOSIS — R319 Hematuria, unspecified: Secondary | ICD-10-CM | POA: Diagnosis not present

## 2024-01-13 DIAGNOSIS — N3001 Acute cystitis with hematuria: Secondary | ICD-10-CM | POA: Diagnosis not present

## 2024-01-13 DIAGNOSIS — Z743 Need for continuous supervision: Secondary | ICD-10-CM | POA: Diagnosis not present

## 2024-01-13 DIAGNOSIS — S32048A Other fracture of fourth lumbar vertebra, initial encounter for closed fracture: Secondary | ICD-10-CM | POA: Insufficient documentation

## 2024-01-13 DIAGNOSIS — R059 Cough, unspecified: Secondary | ICD-10-CM | POA: Diagnosis not present

## 2024-01-13 DIAGNOSIS — Z85110 Personal history of malignant carcinoid tumor of bronchus and lung: Secondary | ICD-10-CM | POA: Diagnosis not present

## 2024-01-13 DIAGNOSIS — R300 Dysuria: Secondary | ICD-10-CM | POA: Diagnosis not present

## 2024-01-13 DIAGNOSIS — R531 Weakness: Secondary | ICD-10-CM | POA: Diagnosis not present

## 2024-01-13 DIAGNOSIS — J101 Influenza due to other identified influenza virus with other respiratory manifestations: Secondary | ICD-10-CM | POA: Insufficient documentation

## 2024-01-13 DIAGNOSIS — Z20822 Contact with and (suspected) exposure to covid-19: Secondary | ICD-10-CM | POA: Diagnosis not present

## 2024-01-13 DIAGNOSIS — C3490 Malignant neoplasm of unspecified part of unspecified bronchus or lung: Secondary | ICD-10-CM | POA: Diagnosis not present

## 2024-01-13 DIAGNOSIS — X58XXXA Exposure to other specified factors, initial encounter: Secondary | ICD-10-CM | POA: Diagnosis not present

## 2024-01-13 DIAGNOSIS — N3289 Other specified disorders of bladder: Secondary | ICD-10-CM | POA: Diagnosis not present

## 2024-01-13 DIAGNOSIS — K449 Diaphragmatic hernia without obstruction or gangrene: Secondary | ICD-10-CM | POA: Diagnosis not present

## 2024-01-13 LAB — RESP PANEL BY RT-PCR (RSV, FLU A&B, COVID)  RVPGX2
Influenza A by PCR: POSITIVE — AB
Influenza B by PCR: NEGATIVE
Resp Syncytial Virus by PCR: NEGATIVE
SARS Coronavirus 2 by RT PCR: NEGATIVE

## 2024-01-13 LAB — CBC WITH DIFFERENTIAL/PLATELET
Abs Immature Granulocytes: 0.03 10*3/uL (ref 0.00–0.07)
Basophils Absolute: 0.1 10*3/uL (ref 0.0–0.1)
Basophils Relative: 1 %
Eosinophils Absolute: 0 10*3/uL (ref 0.0–0.5)
Eosinophils Relative: 1 %
HCT: 42.8 % (ref 36.0–46.0)
Hemoglobin: 13.5 g/dL (ref 12.0–15.0)
Immature Granulocytes: 1 %
Lymphocytes Relative: 13 %
Lymphs Abs: 0.8 10*3/uL (ref 0.7–4.0)
MCH: 29.7 pg (ref 26.0–34.0)
MCHC: 31.5 g/dL (ref 30.0–36.0)
MCV: 94.1 fL (ref 80.0–100.0)
Monocytes Absolute: 0.6 10*3/uL (ref 0.1–1.0)
Monocytes Relative: 9 %
Neutro Abs: 4.6 10*3/uL (ref 1.7–7.7)
Neutrophils Relative %: 75 %
Platelets: 212 10*3/uL (ref 150–400)
RBC: 4.55 MIL/uL (ref 3.87–5.11)
RDW: 13.2 % (ref 11.5–15.5)
WBC: 6.1 10*3/uL (ref 4.0–10.5)
nRBC: 0 % (ref 0.0–0.2)

## 2024-01-13 LAB — URINALYSIS, ROUTINE W REFLEX MICROSCOPIC
Bilirubin Urine: NEGATIVE
Glucose, UA: 50 mg/dL — AB
Ketones, ur: NEGATIVE mg/dL
Nitrite: NEGATIVE
Protein, ur: 100 mg/dL — AB
RBC / HPF: >50 RBC/hpf (ref 0–5)
Specific Gravity, Urine: 1.023 (ref 1.005–1.030)
WBC, UA: >50 WBC/hpf (ref 0–5)
pH: 6 (ref 5.0–8.0)

## 2024-01-13 LAB — BASIC METABOLIC PANEL
Anion gap: 9 (ref 5–15)
BUN: 18 mg/dL (ref 8–23)
CO2: 19 mmol/L — ABNORMAL LOW (ref 22–32)
Calcium: 9.1 mg/dL (ref 8.9–10.3)
Chloride: 112 mmol/L — ABNORMAL HIGH (ref 98–111)
Creatinine, Ser: 0.78 mg/dL (ref 0.44–1.00)
GFR, Estimated: >60 mL/min (ref >60)
Glucose, Bld: 104 mg/dL — ABNORMAL HIGH (ref 70–99)
Potassium: 4.1 mmol/L (ref 3.5–5.1)
Sodium: 140 mmol/L (ref 135–145)

## 2024-01-13 LAB — URINE CULTURE

## 2024-01-13 LAB — SPECIMEN STATUS REPORT

## 2024-01-13 MED ORDER — CEPHALEXIN 500 MG PO CAPS: 500.0000 mg | ORAL_CAPSULE | Freq: Two times a day (BID) | ORAL | 0 refills | Status: AC

## 2024-01-13 MED ORDER — IOHEXOL 300 MG/ML  SOLN
100.0000 mL | Freq: Once | INTRAMUSCULAR | Status: AC | PRN
  Administered 2024-01-13: 80 mL via INTRAVENOUS

## 2024-01-13 MED ORDER — OSELTAMIVIR PHOSPHATE 75 MG PO CAPS: 75.0000 mg | ORAL_CAPSULE | Freq: Two times a day (BID) | ORAL | 0 refills | Status: DC

## 2024-01-13 NOTE — ED Triage Notes (Signed)
BIB EMS for UTI over a month, been on different antibiotics. Also c/o being sick for about a week from being around sick husband. Pt c/o sore throat, cough. No n/v/d. Pt also cancer pt.here at [facility]. Takes eliquis daily

## 2024-01-13 NOTE — Telephone Encounter (Signed)
 Copied from CRM [id number]. Topic: Clinical - Red Word Triage >> Jan 13, 2024 12:23 PM Graeme ORN wrote: Red Word that prompted transfer to Nurse Triage: Patient has blood in urine. Burning pain. Thinks may be kidney stones. Previously seen. Worsening, not getting any better.   Chief Complaint: Dysuria and hematuria  Symptoms: Dysuria, hematuria, flank pain Frequency: Constant, worsening  Pertinent Negatives: Patient denies fever  Disposition: [x] ED /[] Urgent Care (no appt availability in office) / [] Appointment(In office/virtual)/ []  [facility]/ [] Home Care/ [] Refused Recommended Disposition /[] [facility] [facility]/ []  Follow-up with PCP Additional Notes: Patient's husband called with the patient stating that the patient has been taking an antibiotic for a UTI since 01/08/24 and reports her symptoms have been worsening. Patient reports her pain with urination is worsening and that she is also experiencing hematuria and flank pain. Patient believes she may have a kidney stone. No appointment available in the office. Advised for the patient to be evaluated in the ED or urgent care. Patient's husband reports they will seek care at the ED.      Reason for Disposition  [1] Taking antibiotic > 24 hours for UTI AND [2] flank or lower back pain getting WORSE    No appointment, going to ED  Answer Assessment - Initial Assessment Questions 1. MAIN SYMPTOM: What is the main symptom you are concerned about? (e.g., painful urination, urine frequency)     Pain with urination  2. BETTER-SAME-WORSE: Are you getting better, staying the same, or getting worse compared to how you felt at your last visit to the doctor (most recent medical visit)?     Worse 3. PAIN: How bad is the pain?  (e.g., Scale 1-10; mild, moderate, or severe)   - MILD (1-3): complains slightly about urination hurting   - MODERATE (4-7): interferes with normal activities     - SEVERE (8-10): excruciating, unwilling or  unable to urinate because of the pain      9/10 4. FEVER: Do you have a fever? If Yes, ask: What is it, how was it measured, and when did it start?     No 5. OTHER SYMPTOMS: Do you have any other symptoms? (e.g., blood in the urine, flank pain, vaginal discharge)     Blood in urine  6. DIAGNOSIS: When was the UTI diagnosed? By whom? Was it a kidney infection, bladder infection or both?     Yes, at PCP office  7. ANTIBIOTIC: What antibiotic(s) are you taking? How many times per day?     Ciprofloxacin   8. ANTIBIOTIC - START DATE: When did you start taking the antibiotic?     01/08/24  Protocols used: Urinary Tract Infection on Antibiotic Follow-up Call - [facility]

## 2024-01-13 NOTE — ED Provider Notes (Signed)
 [facility] AT [facility] Provider Note   CSN: [id number] Arrival date & time: 01/13/24  1339     History  Chief Complaint  Patient presents with   Urinary Tract Infection    Patricia [name] is a 78 y.o. female history of non-small cell lung cancer on the left side, GERD, prolonged QT, C. difficile presented for dysuria over the past week along with flank pain.  Patient denies fevers nausea vomiting diarrhea.  Patient denies chest pain shortness of breath.  Patient does endorse a sore throat with triage but denied this with me along with a cough she denied this as well.  Patient denies any abdominal pain.  Patient states her urine has been red and that there is burning when she urinates and so she think she has a UTI.  Home Medications Prior to Admission medications   Medication Sig Start Date End Date Taking? Authorizing Provider  cephALEXin  (KEFLEX ) 500 MG capsule Take 1 capsule (500 mg total) by mouth 2 (two) times daily for 7 days. 01/13/24 01/20/24 Yes Merelyn Klump, Lynwood DASEN, PA-C  oseltamivir  (TAMIFLU ) 75 MG capsule Take 1 capsule (75 mg total) by mouth every 12 (twelve) hours. 01/13/24  Yes Arsalan Brisbin, Lynwood DASEN, PA-C  acetaminophen  (TYLENOL ) 500 MG tablet Take 1,000 mg by mouth every 6 (six) hours as needed for mild pain (pain score 1-3).    [provider]  ALPRAZolam  (XANAX ) 0.5 MG tablet Take 1 tablet (0.5 mg total) by mouth at bedtime as needed for anxiety or sleep. TAKE (1) TABLET BY MOUTH AT BEDTIME AS NEEDED FOR SLEEP. Patient taking differently: Take 0.5 mg by mouth at bedtime as needed for sleep. TAKE (1) TABLET BY MOUTH AT BEDTIME AS NEEDED FOR SLEEP. 09/18/23   Mauro Elveria BROCKS, NP  apixaban  (ELIQUIS ) 5 MG TABS tablet Take 1 tablet (5 mg total) by mouth 2 (two) times daily. 10/15/23   Johnson, Clanford L, MD  ciprofloxacin  (CIPRO ) 500 MG tablet Take 1 tablet (500 mg total) by mouth 2 (two) times daily. 01/08/24   Cook, Jayce G, DO  famotidine   (PEPCID ) 40 MG tablet TAKE 1 TABLET BY MOUTH EVERY DAY 12/31/23   Alphonsa Glendia LABOR, MD  HYDROcodone -acetaminophen  (NORCO/VICODIN) 5-325 MG tablet Take 1 tablet by mouth every 8 (eight) hours as needed for moderate pain (pain score 4-6) or severe pain (pain score 7-10). 01/08/24   Cook, Jayce G, DO  KLOR-CON  M20 20 MEQ tablet TAKE 1 TABLET BY MOUTH THREE TIMES A DAY 11/10/23   Alphonsa Glendia LABOR, MD  lansoprazole  (PREVACID ) 30 MG capsule TAKE ONE CAPSULE BY MOUTH DAILY FOR ACID REFLUX 01/02/24   Alphonsa Glendia LABOR, MD  levothyroxine  (SYNTHROID ) 50 MCG tablet TAKE 1 TABLET BY MOUTH DAILY BEFORE BREAKFAST 06/06/23   Sherrod Sherrod, MD  loperamide (IMODIUM) 2 MG capsule Take 2 mg by mouth as needed for diarrhea or loose stools.    [provider]  rosuvastatin  (CRESTOR ) 20 MG tablet Take 20 mg by mouth at bedtime. 12/11/23   [provider]  sertraline  (ZOLOFT ) 50 MG tablet TAKE 1 TABLET BY MOUTH EVERY DAY 01/02/24   Alphonsa Glendia LABOR, MD  topiramate  (TOPAMAX ) 50 MG tablet TAKE 1 TABLET BY MOUTH TWICE A DAY 11/12/23   Vaslow, Zachary K, MD  vancomycin  (VANCOCIN ) 125 MG capsule Take 125 mg by mouth 4 (four) times daily. Take 125mg  QID for 14 days, then 125mg  BID for 7 days, then 125mg  daily for 7 days, then 125mg  every  3 days for 8 weeks    [provider]      Allergies    Codeine    Review of Systems   Review of Systems  Physical Exam Updated Vital Signs BP 112/68   Pulse 74   Temp 98.3 F (36.8 C) (Oral)   Resp 20   Ht 4' 8 (1.422 m)   Wt 45.4 kg   SpO2 95%   BMI 22.42 kg/m  Physical Exam Vitals reviewed.  Constitutional:      General: She is not in acute distress. HENT:     Head: Normocephalic and atraumatic.  Eyes:     Extraocular Movements: Extraocular movements intact.     Conjunctiva/sclera: Conjunctivae normal.     Pupils: Pupils are equal, round, and reactive to light.  Cardiovascular:     Rate and Rhythm: Normal rate and regular rhythm.     Pulses: Normal  pulses.     Heart sounds: Normal heart sounds.     Comments: 2+ bilateral radial/dorsalis pedis pulses with regular rate Pulmonary:     Effort: Pulmonary effort is normal. No respiratory distress.     Breath sounds: Normal breath sounds.  Abdominal:     Palpations: Abdomen is soft.     Tenderness: There is no abdominal tenderness. There is right CVA tenderness and left CVA tenderness. There is no guarding or rebound.  Musculoskeletal:        General: Normal range of motion.     Cervical back: Normal range of motion and neck supple.     Comments: 5 out of 5 bilateral grip/leg extension strength  Skin:    General: Skin is warm and dry.     Capillary Refill: Capillary refill takes less than 2 seconds.  Neurological:     General: No focal deficit present.     Mental Status: She is alert and oriented to person, place, and time.     Comments: Sensation intact in all 4 limbs  Psychiatric:        Mood and Affect: Mood normal.     ED Results / Procedures / Treatments   Labs (all labs ordered are listed, but only abnormal results are displayed) Labs Reviewed  RESP PANEL BY RT-PCR (RSV, FLU A&B, COVID)  RVPGX2 - Abnormal; Notable for the following components:      Result Value   Influenza A by PCR POSITIVE (*)    All other components within normal limits  BASIC METABOLIC PANEL - Abnormal; Notable for the following components:   Chloride 112 (*)    CO2 19 (*)    Glucose, Bld 104 (*)    All other components within normal limits  URINALYSIS, ROUTINE W REFLEX MICROSCOPIC - Abnormal; Notable for the following components:   Color, Urine AMBER (*)    APPearance CLOUDY (*)    Glucose, UA 50 (*)    Hgb urine dipstick LARGE (*)    Protein, ur 100 (*)    Leukocytes,Ua SMALL (*)    Bacteria, UA MANY (*)    All other components within normal limits  URINE CULTURE  CBC WITH DIFFERENTIAL/PLATELET    EKG None  Radiology CT ABDOMEN PELVIS W CONTRAST Result Date: 01/13/2024 CLINICAL DATA:   UTI sore throat cough EXAM: CT ABDOMEN AND PELVIS WITH CONTRAST TECHNIQUE: Multidetector CT imaging of the abdomen and pelvis was performed using the standard protocol following bolus administration of intravenous contrast. RADIATION DOSE REDUCTION: This exam was performed according to the departmental dose-optimization program which  includes automated exposure control, adjustment of the mA and/or kV according to patient size and/or use of iterative reconstruction technique. CONTRAST:  80mL OMNIPAQUE  IOHEXOL  300 MG/ML  SOLN COMPARISON:  PET CT 01/05/2024, CT 11/12/2023 FINDINGS: Lower chest: Lung bases demonstrate no acute airspace disease. Moderate hiatal hernia Hepatobiliary: No focal liver abnormality is seen. No gallstones, gallbladder wall thickening, or biliary dilatation. Pancreas: Unremarkable. No pancreatic ductal dilatation or surrounding inflammatory changes. Spleen: Normal in size without focal abnormality. Adrenals/Urinary Tract: Adrenal glands are normal. Kidneys show no hydronephrosis. Scarring at the lower pole of the left kidney. Mild diffuse bladder wall thickening with mucosal enhancement and perivesical stranding Stomach/Bowel: Stomach nonenlarged. Few air-filled nondistended loops of small bowel. Fluid throughout the colon. No acute bowel wall thickening. Vascular/Lymphatic: Moderate aortic atherosclerosis. No aneurysm. No suspicious lymph nodes Reproductive: Hysterectomy.  No adnexal mass Other: Negative for pelvic effusion or free air Musculoskeletal: Acute appearing mild superior endplate fracture at L4 IMPRESSION: 1. Mild diffuse bladder wall thickening with mucosal enhancement and perivesical stranding, findings suggestive of cystitis. 2. Fluid throughout the colon suggesting diarrhea. No acute bowel wall thickening 3. Acute appearing mild superior endplate fracture at L4. 4. Moderate hiatal hernia. 5. Aortic atherosclerosis. Aortic Atherosclerosis (ICD10-I70.0). Electronically Signed   By:  Luke Bun M.D.   On: 01/13/2024 17:35   DG Chest Port 1 View Result Date: 01/13/2024 CLINICAL DATA:  Cough and weakness. Urinary tract infection. History of non-small cell lung cancer. EXAM: PORTABLE CHEST 1 VIEW COMPARISON:  Radiographs 10/13/2023 and 01/20/2023. CT 09/15/2023. PET-CT 01/05/2024. FINDINGS: 1509 hours. The heart size and mediastinal contours are stable with aortic atherosclerosis and a moderate-sized hiatal hernia. The lungs appear clear. There is no pleural effusion or pneumothorax. The bones appear unremarkable. IMPRESSION: No evidence of acute cardiopulmonary process. Moderate-sized hiatal hernia. Electronically Signed   By: Elsie Perone M.D.   On: 01/13/2024 16:04    Procedures Procedures    Medications Ordered in ED Medications  iohexol  (OMNIPAQUE ) 300 MG/ML solution 100 mL (80 mLs Intravenous Contrast Given 01/13/24 1638)    ED Course/ Medical Decision Making/ A&P                                 Medical Decision Making Amount and/or Complexity of Data Reviewed Labs: ordered. Radiology: ordered.   LOYS HOSELTON 78 y.o. presented today for dysuria and flank pain, cough sore throat. Working DDx that I considered at this time includes, but not limited to, UTI, pyelonephritis, RCC, urinary obstruction, nephrolithiasis, viral illness, pneumonia.  R/o DDx: pyelonephritis, RCC, urinary obstruction, nephrolithiasis, pneumonia: These are considered less likely due to history of present illness, physical exam, labs/imaging findings  Review of prior external notes: 01/08/2024 office visit  Unique Tests and My Independent Interpretation:  CBC: Unremarkable BMP: Unremarkable Respiratory panel: UA: Pyuria, large hemoglobin, small leukocytes Chest x-ray: No acute findings CT abdomen pelvis contrast: Acute mild superior endplate fracture at L4, suspected cystitis Urine culture pending Respiratory panel: Influenza A positive  Social Determinants of Health:  none  Discussion with Independent Historian:  Husband  Discussion of Management of Tests: None  Risk: Medium: prescription drug management  Risk Stratification Score: None  Staffed with Yolande, MD  Plan: On exam patient was in no acute distress stable vitals.  At the bedside patient had a urine sample that did appear grossly bloody.  Patient was endorsing dysuria and hematuria over the past week however on  exam also had bilateral CVA tenderness.  Patient is not currently on chemo or radiation however given her risk factors we will obtain labs and get a CT scan to further assess.  Patient denied any sore throat or cough with me however did endorse this with triage and so we will get a swab and a chest x-ray as well.  Starting the rest of patient's labs and imaging however UA does show UTI and if the rest of her workup is reassuring do plan on discharging with antibiotics.  Labs do show that she has influenza A.  After speaking with husband patient patient's sore throat cough is brought on for less than 48 hours that she would qualify for Tamiflu  as she is a high risk patient.  Patient's labs and imaging show that she has an acute L4 superior endplate fracture.  Patient is not had any recent trauma or falls and so to suspect this could be related to osteoporosis.  Will patient for follow-up in the outpatient setting with neurosurgery as she is not having any saddle anesthesia or urinary or bowel incontinence.  Will order patient's first round of Tamiflu  along with Keflex  and prescribe the rest.  I spoke to the patient back the importance of remaining hydrated and following up with her primary care provider and using Tylenol  every 6 hours needed for pain.  Patient was given return precautions. Patient stable for discharge at this time.  Patient verbalized understanding of plan.  This chart was dictated using voice recognition software.  Despite best efforts to proofread,  errors can occur  which can change the documentation meaning.         Final Clinical Impression(s) / ED Diagnoses Final diagnoses:  Influenza A  Other closed fracture of fourth lumbar vertebra, initial encounter (HCC)  Acute cystitis with hematuria    Rx / DC Orders ED Discharge Orders          Ordered    cephALEXin  (KEFLEX ) 500 MG capsule  2 times daily        01/13/24 1758    oseltamivir  (TAMIFLU ) 75 MG capsule  Every 12 hours        01/13/24 1758              Patricia [name] 01/13/24 1812    Yolande Lamar BROCKS, MD 01/15/24 1409

## 2024-01-13 NOTE — Discharge Instructions (Addendum)
 Please follow-up with the neurosurgeon and your primary care provider in regards recent ER visit.  Today your labs and imaging show that you have a small fracture in your L4 they can follow-up with the neurosurgeon for.  Please avoid any situations in which you may fall or hurt yourself as this could worsen the fracture.  He also appear to have UTI for which I have given you your first dose of antibiotics but you need to pick up the rest.  He also tested positive for the flu as well and you are given your first dose of Tamiflu  and I prescribed the rest above.  He may take Tylenol  every 6 hours as needed for pain.  Please remain hydrated.  If symptoms change or worsen please return to the ER.

## 2024-01-14 ENCOUNTER — Encounter: Payer: Self-pay | Admitting: Family Medicine

## 2024-01-14 ENCOUNTER — Inpatient Hospital Stay: Payer: Medicare HMO | Admitting: Hematology

## 2024-01-14 LAB — URINE CULTURE: Culture: NO GROWTH

## 2024-01-15 ENCOUNTER — Other Ambulatory Visit: Payer: Self-pay | Admitting: Family Medicine

## 2024-01-15 ENCOUNTER — Other Ambulatory Visit: Payer: Self-pay | Admitting: Nurse Practitioner

## 2024-01-26 ENCOUNTER — Inpatient Hospital Stay: Payer: Medicare HMO | Attending: Internal Medicine | Admitting: Hematology

## 2024-01-26 ENCOUNTER — Inpatient Hospital Stay: Payer: Medicare HMO

## 2024-01-26 VITALS — BP 102/75 | HR 85 | Temp 97.6°F | Resp 18 | Wt 95.5 lb

## 2024-01-26 DIAGNOSIS — N390 Urinary tract infection, site not specified: Secondary | ICD-10-CM | POA: Diagnosis not present

## 2024-01-26 DIAGNOSIS — G319 Degenerative disease of nervous system, unspecified: Secondary | ICD-10-CM | POA: Diagnosis not present

## 2024-01-26 DIAGNOSIS — Z801 Family history of malignant neoplasm of trachea, bronchus and lung: Secondary | ICD-10-CM | POA: Insufficient documentation

## 2024-01-26 DIAGNOSIS — R918 Other nonspecific abnormal finding of lung field: Secondary | ICD-10-CM | POA: Insufficient documentation

## 2024-01-26 DIAGNOSIS — R300 Dysuria: Secondary | ICD-10-CM

## 2024-01-26 DIAGNOSIS — Z87891 Personal history of nicotine dependence: Secondary | ICD-10-CM | POA: Diagnosis not present

## 2024-01-26 DIAGNOSIS — K5190 Ulcerative colitis, unspecified, without complications: Secondary | ICD-10-CM | POA: Diagnosis not present

## 2024-01-26 DIAGNOSIS — E785 Hyperlipidemia, unspecified: Secondary | ICD-10-CM | POA: Diagnosis not present

## 2024-01-26 DIAGNOSIS — Z9221 Personal history of antineoplastic chemotherapy: Secondary | ICD-10-CM | POA: Insufficient documentation

## 2024-01-26 DIAGNOSIS — Z8719 Personal history of other diseases of the digestive system: Secondary | ICD-10-CM | POA: Insufficient documentation

## 2024-01-26 DIAGNOSIS — Z8249 Family history of ischemic heart disease and other diseases of the circulatory system: Secondary | ICD-10-CM | POA: Diagnosis not present

## 2024-01-26 DIAGNOSIS — Z8673 Personal history of transient ischemic attack (TIA), and cerebral infarction without residual deficits: Secondary | ICD-10-CM | POA: Insufficient documentation

## 2024-01-26 DIAGNOSIS — K52832 Lymphocytic colitis: Secondary | ICD-10-CM | POA: Diagnosis not present

## 2024-01-26 DIAGNOSIS — C3492 Malignant neoplasm of unspecified part of left bronchus or lung: Secondary | ICD-10-CM

## 2024-01-26 DIAGNOSIS — Z8041 Family history of malignant neoplasm of ovary: Secondary | ICD-10-CM | POA: Insufficient documentation

## 2024-01-26 DIAGNOSIS — Z809 Family history of malignant neoplasm, unspecified: Secondary | ICD-10-CM | POA: Diagnosis not present

## 2024-01-26 DIAGNOSIS — Z85118 Personal history of other malignant neoplasm of bronchus and lung: Secondary | ICD-10-CM | POA: Diagnosis not present

## 2024-01-26 DIAGNOSIS — R32 Unspecified urinary incontinence: Secondary | ICD-10-CM | POA: Diagnosis not present

## 2024-01-26 DIAGNOSIS — F329 Major depressive disorder, single episode, unspecified: Secondary | ICD-10-CM | POA: Diagnosis not present

## 2024-01-26 DIAGNOSIS — N182 Chronic kidney disease, stage 2 (mild): Secondary | ICD-10-CM | POA: Diagnosis not present

## 2024-01-26 DIAGNOSIS — Z803 Family history of malignant neoplasm of breast: Secondary | ICD-10-CM | POA: Diagnosis not present

## 2024-01-26 DIAGNOSIS — Z823 Family history of stroke: Secondary | ICD-10-CM | POA: Diagnosis not present

## 2024-01-26 DIAGNOSIS — Z7901 Long term (current) use of anticoagulants: Secondary | ICD-10-CM | POA: Diagnosis not present

## 2024-01-26 DIAGNOSIS — Z008 Encounter for other general examination: Secondary | ICD-10-CM | POA: Diagnosis not present

## 2024-01-26 DIAGNOSIS — K219 Gastro-esophageal reflux disease without esophagitis: Secondary | ICD-10-CM | POA: Diagnosis not present

## 2024-01-26 LAB — URINALYSIS, ROUTINE W REFLEX MICROSCOPIC
Bilirubin Urine: NEGATIVE
Glucose, UA: NEGATIVE mg/dL
Ketones, ur: NEGATIVE mg/dL
Nitrite: NEGATIVE
Protein, ur: 100 mg/dL — AB
RBC / HPF: >50 RBC/hpf (ref 0–5)
Specific Gravity, Urine: 1.02 (ref 1.005–1.030)
WBC, UA: >50 WBC/hpf (ref 0–5)
pH: 5 (ref 5.0–8.0)

## 2024-01-26 NOTE — Patient Instructions (Addendum)
[facility]  Discharge Instructions  You were seen and examined today by Dr. Ellin Saba.  Dr. Ellin Saba discussed your most recent PET scan which is stable.  Dr. Ellin Saba has recommended continued monitoring.  Follow-up as scheduled.  Thank you for choosing [facility] to provide your oncology and hematology care.   To afford each patient quality time with our provider, please arrive at least 15 minutes before your scheduled appointment time. You may need to reschedule your appointment if you arrive late (10 or more minutes). Arriving late affects you and other patients whose appointments are after yours.  Also, if you miss three or more appointments without notifying the office, you may be dismissed from the clinic at the provider's discretion.    Again, thank you for choosing [facility].  Our hope is that these requests will decrease the amount of time that you wait before being seen by our physicians.   If you have a lab appointment with the Cancer Center - please note that after April 8th, all labs will be drawn in the cancer center.  You do not have to check in or register with the main entrance as you have in the past but will complete your check-in at the cancer center.            _____________________________________________________________  Should you have questions after your visit to [facility], please contact our office at [phone] and follow the prompts.  Our office hours are 8:00 a.m. to 4:30 p.m. Monday - Thursday and 8:00 a.m. to 2:30 p.m. Friday.  Please note that voicemails left after 4:00 p.m. may not be returned until the following business day.  We are closed weekends and all major holidays.  You do have access to a nurse 24-7, just call the main number to the clinic [phone] and do not press any options, hold on the line and a nurse will answer the phone.    For prescription  refill requests, have your pharmacy contact our office and allow 72 hours.    Masks are no longer required in the cancer centers. If you would like for your care team to wear a mask while they are taking care of you, please let them know. You may have one support person who is at least 76 years old accompany you for your appointments.

## 2024-01-26 NOTE — Progress Notes (Signed)
[facility] [address] [place] [address]    Clinic Day:  01/26/2024  Referring physician: [name], MD  Patient Care Team: [name], MD as PCP - General (Family Medicine) Doreatha Massed, MD as Medical Oncologist (Medical Oncology) Therese Sarah, RN as Oncology Nurse Navigator (Medical Oncology)   ASSESSMENT & PLAN:   Assessment: 1.  Metastatic adenocarcinoma of the lung PD-L1 99%, K-ras G12 C: - PET scan at diagnosis (12/06/2022): 2.3 cm left lung nodule, hypermetabolic mediastinal and left supraclavicular adenopathy and enlarged AP window lymph node impinging on the left recurrent laryngeal nerve.  Multiple hypermetabolic cutaneous nodules within the chest, abdomen and pelvis and extremities.  Right adrenal gland metastasis.  Peritoneal and retroperitoneal nodular metastasis.  Muscular metastasis primarily in the pelvis. - Left supraclavicular lymph node biopsy (12/16/2022): Metastatic adenocarcinoma.  Positive for CK7 and TTF-1. - Foundation 1: K-ras G 12C.  MS-stable.  TMB-low.  PD-L1 TPS-99%. - Cycle 1 of carboplatin, pembrolizumab and pemetrexed on 12/30/2022, abandoned as she was hospitalized with stroke. - Keytruda from 02/18/2023 through 08/18/2023, discontinued after hospital admission with diarrhea and colitis.  Colonoscopy and biopsies showed lymphocytic colitis which improved with 8 weeks of Entocort. - Adagrasib 600 mg twice daily started on September 26, 2023, discontinued after hospitalization on 11/13/2023 through 11/15/2023 with C. difficile colitis - Not entirely clear if she had pembrolizumab induced colitis as her biopsy showed lymphocytic colitis as opposed to neutrophilic colitis commonly seen in immunotherapy related colitis.   2. Social/Family History: -Lives at home with husband. She is somewhat independent of ADL's and dependent of IADL's. Quit tobacco use over 30 years ago.  She has left leg weakness from prior CVA and has  difficulty with balance.  Uses walker at home. -Maternal uncle had lung cancer. Paternal aunt had ovarian cancer.    Plan: 1.  Metastatic adenocarcinoma of the left lung, PD-L1 99%, K-ras G 12C: - PET scan (01/04/2023): 9 mm stable left lower lobe lung nodule with no hypermetabolism.  No evidence of local recurrence or metastatic disease. - She was recently seen in the ER for UTI.  A CT AP on 01/13/2024: Mild diffuse bladder wall thickening with perivesical stranding suggestive of cystitis.  She had a UA positive and was treated with Keflex.  She continues to have dysuria.  Recommend repeating UA today.  If UA has no infection, she was told to take Azo and drink a lot of water.  She is currently drinking cranberry juice which is making her have watery bowel movements. - She has follow-up with Dr. Jena Gauss as she is having 3-4 watery bowel movements per day. - As she had no evidence of disease, I did not recommend any active therapy for her metastatic lung cancer. - Will continue close monitoring.  I will schedule another set of scans in 3 months.  If there is any recurrence, may consider cemiplimab or other K-ras G 12C TKI.     Orders Placed This Encounter  Procedures   Urine culture    Standing Status:   Future    Number of Occurrences:   1    Expected Date:   01/26/2024    Expiration Date:   01/25/2025   NM PET Image Restag (PS) Skull Base To Thigh    Standing Status:   Future    Expected Date:   04/24/2024    Expiration Date:   01/25/2025    If indicated for the ordered procedure,  I authorize the administration of a radiopharmaceutical per Radiology protocol:   Yes    Preferred imaging location?:   [facility]    Release to patient:   Immediate   Urinalysis, Routine w reflex microscopic    Standing Status:   Future    Number of Occurrences:   1    Expected Date:   01/26/2024    Expiration Date:   01/25/2025   CBC with Differential    Standing Status:   Future    Expected Date:   04/24/2024     Expiration Date:   01/25/2025   Comprehensive metabolic panel    Standing Status:   Future    Expected Date:   04/24/2024    Expiration Date:   01/25/2025   Magnesium    Standing Status:   Future    Expected Date:   04/24/2024    Expiration Date:   01/25/2025      I,Katie Daubenspeck,acting as a scribe for Doreatha Massed, MD.,have documented all relevant documentation on the behalf of Doreatha Massed, MD,as directed by  Doreatha Massed, MD while in the presence of Doreatha Massed, MD.   I, Doreatha Massed MD, have reviewed the above documentation for accuracy and completeness, and I agree with the above.   Doreatha Massed, MD   2/17/202512:57 PM  CHIEF COMPLAINT:   Diagnosis: Non-small cell lung cancer    Cancer Staging  Non-small cell lung cancer, left (HCC) Staging form: Lung, AJCC 8th Edition - Clinical: Stage IVB (cT1c, cN3, cM1c) - Signed by Si Gaul, MD on 12/19/2022    Prior Therapy: 1. carboplatin, pembrolizumab and pemetrexed, 1 cycle on 12/30/22 2. Keytruda, 02/18/23 - 08/18/23 3. Adagrasib, 09/26/23 - 11/13/23  Current Therapy: Observation   HISTORY OF PRESENT ILLNESS:   Oncology History  Non-small cell lung cancer, left (HCC)  12/19/2022 Initial Diagnosis   Adenocarcinoma of left lung, stage 4 (HCC)   12/19/2022 Cancer Staging   Staging form: Lung, AJCC 8th Edition - Clinical: Stage IVB (cT1c, cN3, cM1c) - Signed by Si Gaul, MD on 12/19/2022   12/30/2022 - 08/18/2023 Chemotherapy   Patient is on Treatment Plan : LUNG Carboplatin (5) + Pemetrexed (500) + Pembrolizumab (200) D1 q21d Induction x 4 cycles / Maintenance Pemetrexed (500) + Pembrolizumab (200) D1 q21d        INTERVAL HISTORY:   Patricia [name] is a 78 y.o. female presenting to clinic today for follow up of Non-small cell lung cancer. She was last seen by me on 12/16/23 in consultation.  Since her last visit, she underwent restaging PET scan on 01/05/24 showing: unchanged  residual LLL pulmonary nodule with no hypermetabolic activity; no evidence of local recurrence or metastatic disease.  Today, she states that she is doing well overall. Her appetite level is at 40%. Her energy level is at 10%.  PAST MEDICAL HISTORY:   Past Medical History: Past Medical History:  Diagnosis Date   Depression    GERD (gastroesophageal reflux disease)    Hypercholesterolemia    Hypokalemia 06/23/2023   IFG (impaired fasting glucose)    Nonintractable episodic headache 01/18/2023   Osteopenia    PONV (postoperative nausea and vomiting)    Stroke [facility])     Surgical History: Past Surgical History:  Procedure Laterality Date   ABDOMINAL HYSTERECTOMY     BIOPSY  06/24/2023   Procedure: BIOPSY;  Surgeon: Dolores Frame, MD;  Location: [facility];  Service: Gastroenterology;;   CATARACT EXTRACTION W/PHACO  09/12/2011  Procedure: CATARACT EXTRACTION PHACO AND INTRAOCULAR LENS PLACEMENT (IOC);  Surgeon: Gemma Payor;  Location: [facility];  Service: Ophthalmology;  Laterality: Left;  CDE: 8.91   CATARACT EXTRACTION W/PHACO  11/18/2011   Procedure: CATARACT EXTRACTION PHACO AND INTRAOCULAR LENS PLACEMENT (IOC);  Surgeon: Gemma Payor;  Location: [facility];  Service: Ophthalmology;  Laterality: Right;  CDE:10.26   COLONOSCOPY  2007   Dr. Jena Gauss: internal hemorrhoids, single anal papilla poor prep.    COLONOSCOPY N/A 03/27/2017   Rourk: Normal exam.   COLONOSCOPY  06/24/2023   Procedure: COLONOSCOPY;  Surgeon: Marguerita Merles, Reuel Boom, MD;  Location: [facility];  Service: Gastroenterology;;   ECTOPIC PREGNANCY SURGERY     removal of right tube   ESOPHAGOGASTRODUODENOSCOPY (EGD) WITH PROPOFOL N/A 09/10/2021   Procedure: ESOPHAGOGASTRODUODENOSCOPY (EGD) WITH PROPOFOL;  Surgeon: Corbin Ade, MD;  Location: [facility];  Service: Endoscopy;  Laterality: N/A;  8:15AM   MALONEY DILATION N/A 09/10/2021   Procedure: Elease Hashimoto DILATION;  Surgeon: Corbin Ade, MD;   Location: [facility];  Service: Endoscopy;  Laterality: N/A;   OVARIAN CYST REMOVAL     right   RECTOCELE REPAIR N/A 01/18/2014   Procedure: POSTERIOR REPAIR (RECTOCELE);  Surgeon: Tilda Burrow, MD;  Location: [facility];  Service: Gynecology;  Laterality: N/A;   VAGINAL HYSTERECTOMY N/A 01/18/2014   Procedure: HYSTERECTOMY VAGINAL;  Surgeon: Tilda Burrow, MD;  Location: [facility];  Service: Gynecology;  Laterality: N/A;    Social History: Social History   Socioeconomic History   Marital status: Married    Spouse name: Not on file   Number of children: Not on file   Years of education: Not on file   Highest education level: Not on file  Occupational History   Not on file  Tobacco Use   Smoking status: Former    Current packs/day: 0.00    Average packs/day: 1 pack/day for 20.0 years (20.0 ttl pk-yrs)    Types: Cigarettes    Start date: 09/10/1964    Quit date: 09/10/1984    Years since quitting: 39.4   Smokeless tobacco: Never  Vaping Use   Vaping status: Never Used  Substance and Sexual Activity   Alcohol use: No   Drug use: No   Sexual activity: Not Currently    Birth control/protection: Post-menopausal  Other Topics Concern   Not on file  Social History Narrative   Not on file   Social Drivers of Health   Financial Resource Strain: Not on file  Food Insecurity: No Food Insecurity (11/14/2023)   Hunger Vital Sign    Worried About Running Out of Food in the Last Year: Never true    Ran Out of Food in the Last Year: Never true  Transportation Needs: No Transportation Needs (11/14/2023)   PRAPARE - Administrator, Civil Service (Medical): No    Lack of Transportation (Non-Medical): No  Physical Activity: Not on file  Stress: Not on file  Social Connections: Unknown (04/20/2022)   Received from [facility]   Social Network    Social Network: Not on file  Intimate Partner Violence: Not At Risk (11/14/2023)   Humiliation, Afraid, Rape, and  Kick questionnaire    Fear of Current or Ex-Partner: No    Emotionally Abused: No    Physically Abused: No    Sexually Abused: No    Family History: Family History  Problem Relation Age of Onset   Osteoporosis Mother    Heart  attack Father    Heart attack Brother 61       MI   Breast cancer Maternal Aunt    Breast cancer Paternal Aunt    Cancer Paternal Aunt        ovarian cancer   Anesthesia problems Neg Hx    Hypotension Neg Hx    Malignant hyperthermia Neg Hx    Pseudochol deficiency Neg Hx    Colon cancer Neg Hx    Inflammatory bowel disease Neg Hx     Current Medications:  Current Outpatient Medications:    acetaminophen (TYLENOL) 500 MG tablet, Take 1,000 mg by mouth every 6 (six) hours as needed for mild pain (pain score 1-3)., Disp: , Rfl:    ALPRAZolam (XANAX) 0.5 MG tablet, Take 1 tablet (0.5 mg total) by mouth at bedtime as needed for anxiety or sleep. TAKE (1) TABLET BY MOUTH AT BEDTIME AS NEEDED FOR SLEEP. (Patient taking differently: Take 0.5 mg by mouth at bedtime as needed for sleep. TAKE (1) TABLET BY MOUTH AT BEDTIME AS NEEDED FOR SLEEP.), Disp: 30 tablet, Rfl: 2   apixaban (ELIQUIS) 5 MG TABS tablet, Take 1 tablet (5 mg total) by mouth 2 (two) times daily., Disp: 60 tablet, Rfl: 1   ciprofloxacin (CIPRO) 500 MG tablet, Take 1 tablet (500 mg total) by mouth 2 (two) times daily., Disp: 14 tablet, Rfl: 0   famotidine (PEPCID) 40 MG tablet, TAKE 1 TABLET BY MOUTH EVERY DAY, Disp: 90 tablet, Rfl: 1   HYDROcodone-acetaminophen (NORCO/VICODIN) 5-325 MG tablet, Take 1 tablet by mouth every 8 (eight) hours as needed for moderate pain (pain score 4-6) or severe pain (pain score 7-10)., Disp: 15 tablet, Rfl: 0   KLOR-CON M20 20 MEQ tablet, TAKE 1 TABLET BY MOUTH THREE TIMES A DAY, Disp: 270 tablet, Rfl: 1   lansoprazole (PREVACID) 30 MG capsule, TAKE ONE CAPSULE BY MOUTH DAILY FOR ACID REFLUX, Disp: 90 capsule, Rfl: 1   levothyroxine (SYNTHROID) 50 MCG tablet, TAKE 1  TABLET BY MOUTH DAILY BEFORE BREAKFAST, Disp: 90 tablet, Rfl: 1   loperamide (IMODIUM) 2 MG capsule, Take 2 mg by mouth as needed for diarrhea or loose stools., Disp: , Rfl:    oseltamivir (TAMIFLU) 75 MG capsule, Take 1 capsule (75 mg total) by mouth every 12 (twelve) hours., Disp: 10 capsule, Rfl: 0   PARoxetine (PAXIL) 40 MG tablet, Take 40 mg by mouth every morning., Disp: , Rfl:    rosuvastatin (CRESTOR) 20 MG tablet, Take 20 mg by mouth at bedtime., Disp: , Rfl:    sertraline (ZOLOFT) 50 MG tablet, TAKE 1 TABLET BY MOUTH EVERY DAY, Disp: 90 tablet, Rfl: 1   topiramate (TOPAMAX) 100 MG tablet, TAKE 1 TABLET BY MOUTH TWICE A DAY, Disp: 180 tablet, Rfl: 0   vancomycin (VANCOCIN) 125 MG capsule, Take 125 mg by mouth 4 (four) times daily. Take 125mg  QID for 14 days, then 125mg  BID for 7 days, then 125mg  daily for 7 days, then 125mg  every 3 days for 8 weeks, Disp: , Rfl:    Allergies: Allergies  Allergen Reactions   Codeine Nausea Only    REVIEW OF SYSTEMS:   Review of Systems  Constitutional:  Negative for chills, fatigue and fever.  HENT:   Negative for lump/mass, mouth sores, nosebleeds, sore throat and trouble swallowing.   Eyes:  Negative for eye problems.  Respiratory:  Negative for cough and shortness of breath.   Cardiovascular:  Negative for chest pain, leg swelling and palpitations.  Gastrointestinal:  Positive for diarrhea. Negative for abdominal pain, constipation, nausea and vomiting.  Genitourinary:  Positive for dysuria. Negative for bladder incontinence, difficulty urinating, frequency, hematuria and nocturia.   Musculoskeletal:  Negative for arthralgias, back pain, flank pain, myalgias and neck pain.  Skin:  Negative for itching and rash.  Neurological:  Negative for dizziness, headaches and numbness.  Hematological:  Does not bruise/bleed easily.  Psychiatric/Behavioral:  Positive for sleep disturbance. Negative for depression and suicidal ideas. The patient is not  nervous/anxious.   All other systems reviewed and are negative.    VITALS:   Blood pressure 102/75, pulse 85, temperature 97.6 F (36.4 C), temperature source Tympanic, resp. rate 18, weight 95 lb 8 oz (43.3 kg), SpO2 96%.  Wt Readings from Last 3 Encounters:  01/26/24 95 lb 8 oz (43.3 kg)  01/13/24 100 lb (45.4 kg)  01/08/24 103 lb (46.7 kg)    Body mass index is 21.41 kg/m.  Performance status (ECOG): 1 - Symptomatic but completely ambulatory  PHYSICAL EXAM:   Physical Exam Vitals and nursing note reviewed. Exam conducted with a chaperone present.  Constitutional:      Appearance: Normal appearance.  Cardiovascular:     Rate and Rhythm: Normal rate and regular rhythm.     Pulses: Normal pulses.     Heart sounds: Normal heart sounds.  Pulmonary:     Effort: Pulmonary effort is normal.     Breath sounds: Normal breath sounds.  Abdominal:     Palpations: Abdomen is soft. There is no hepatomegaly, splenomegaly or mass.     Tenderness: There is no abdominal tenderness.  Musculoskeletal:     Right lower leg: No edema.     Left lower leg: No edema.  Lymphadenopathy:     Cervical: No cervical adenopathy.     Right cervical: No superficial, deep or posterior cervical adenopathy.    Left cervical: No superficial, deep or posterior cervical adenopathy.     Upper Body:     Right upper body: No supraclavicular or axillary adenopathy.     Left upper body: No supraclavicular or axillary adenopathy.  Neurological:     General: No focal deficit present.     Mental Status: She is alert and oriented to person, place, and time.  Psychiatric:        Mood and Affect: Mood normal.        Behavior: Behavior normal.     LABS:   CBC     Component Value Date/Time   WBC 6.1 01/13/2024 1502   RBC 4.55 01/13/2024 1502   HGB 13.5 01/13/2024 1502   HGB 15.0 11/21/2023 1035   HCT 42.8 01/13/2024 1502   HCT 48.1 (H) 11/21/2023 1035   PLT 212 01/13/2024 1502   PLT 387 11/21/2023 1035    MCV 94.1 01/13/2024 1502   MCV 92 11/21/2023 1035   MCH 29.7 01/13/2024 1502   MCHC 31.5 01/13/2024 1502   RDW 13.2 01/13/2024 1502   RDW 14.6 11/21/2023 1035   LYMPHSABS 0.8 01/13/2024 1502   LYMPHSABS 2.7 11/21/2023 1035   MONOABS 0.6 01/13/2024 1502   EOSABS 0.0 01/13/2024 1502   EOSABS 0.1 11/21/2023 1035   BASOSABS 0.1 01/13/2024 1502   BASOSABS 0.2 11/21/2023 1035    CMP      Component Value Date/Time   NA 140 01/13/2024 1502   NA 144 11/21/2023 1035   K 4.1 01/13/2024 1502   CL 112 (H) 01/13/2024 1502   CO2 19 (L) 01/13/2024  1502   GLUCOSE 104 (H) 01/13/2024 1502   BUN 18 01/13/2024 1502   BUN 16 11/21/2023 1035   CREATININE 0.78 01/13/2024 1502   CREATININE 1.20 (H) 09/29/2023 0836   CALCIUM 9.1 01/13/2024 1502   PROT 6.0 (L) 11/13/2023 1152   PROT 7.0 08/02/2022 1112   ALBUMIN 3.3 (L) 11/13/2023 1152   ALBUMIN 4.8 08/02/2022 1112   AST 19 11/13/2023 1152   AST 13 (L) 09/29/2023 0836   ALT 24 11/13/2023 1152   ALT 13 09/29/2023 0836   ALKPHOS 107 11/13/2023 1152   BILITOT 0.9 11/13/2023 1152   BILITOT 0.3 09/29/2023 0836   GFRNONAA >60 01/13/2024 1502   GFRNONAA 47 (L) 09/29/2023 0836   GFRAA >90 01/19/2014 0530     No results found for: "CEA1", "CEA" / No results found for: "CEA1", "CEA" No results found for: "PSA1" No results found for: "WUJ811" No results found for: "CAN125"  No results found for: "TOTALPROTELP", "ALBUMINELP", "A1GS", "A2GS", "BETS", "BETA2SER", "GAMS", "MSPIKE", "SPEI" No results found for: "TIBC", "FERRITIN", "IRONPCTSAT" No results found for: "LDH"   STUDIES:   CT ABDOMEN PELVIS W CONTRAST Result Date: 01/13/2024 CLINICAL DATA:  UTI sore throat cough EXAM: CT ABDOMEN AND PELVIS WITH CONTRAST TECHNIQUE: Multidetector CT imaging of the abdomen and pelvis was performed using the standard protocol following bolus administration of intravenous contrast. RADIATION DOSE REDUCTION: This exam was performed according to the  departmental dose-optimization program which includes automated exposure control, adjustment of the mA and/or kV according to patient size and/or use of iterative reconstruction technique. CONTRAST:  80mL OMNIPAQUE IOHEXOL 300 MG/ML  SOLN COMPARISON:  PET CT 01/05/2024, CT 11/12/2023 FINDINGS: Lower chest: Lung bases demonstrate no acute airspace disease. Moderate hiatal hernia Hepatobiliary: No focal liver abnormality is seen. No gallstones, gallbladder wall thickening, or biliary dilatation. Pancreas: Unremarkable. No pancreatic ductal dilatation or surrounding inflammatory changes. Spleen: Normal in size without focal abnormality. Adrenals/Urinary Tract: Adrenal glands are normal. Kidneys show no hydronephrosis. Scarring at the lower pole of the left kidney. Mild diffuse bladder wall thickening with mucosal enhancement and perivesical stranding Stomach/Bowel: Stomach nonenlarged. Few air-filled nondistended loops of small bowel. Fluid throughout the colon. No acute bowel wall thickening. Vascular/Lymphatic: Moderate aortic atherosclerosis. No aneurysm. No suspicious lymph nodes Reproductive: Hysterectomy.  No adnexal mass Other: Negative for pelvic effusion or free air Musculoskeletal: Acute appearing mild superior endplate fracture at L4 IMPRESSION: 1. Mild diffuse bladder wall thickening with mucosal enhancement and perivesical stranding, findings suggestive of cystitis. 2. Fluid throughout the colon suggesting diarrhea. No acute bowel wall thickening 3. Acute appearing mild superior endplate fracture at L4. 4. Moderate hiatal hernia. 5. Aortic atherosclerosis. Aortic Atherosclerosis (ICD10-I70.0). Electronically Signed   By: Jasmine Pang M.D.   On: 01/13/2024 17:35   NM PET Image Restag (PS) Skull Base To Thigh Result Date: 01/13/2024 CLINICAL DATA:  Subsequent treatment strategy for non-small cell lung cancer. EXAM: NUCLEAR MEDICINE PET SKULL BASE TO THIGH TECHNIQUE: 5.24 mCi F-18 FDG was injected  intravenously. Full-ring PET imaging was performed from the skull base to thigh after the radiotracer. CT data was obtained and used for attenuation correction and anatomic localization. Fasting blood glucose: 102 mg/dl COMPARISON:  PET-CT 91/47/8295. Abdominopelvic CT 11/13/2023 and chest CT 09/15/2023. FINDINGS: Mediastinal blood pool activity: SUV max 2.2 NECK: No hypermetabolic cervical lymph nodes are identified. No suspicious activity identified within the pharyngeal mucosal space. Incidental CT findings: Bilateral carotid atherosclerosis. CHEST: There are no hypermetabolic mediastinal, hilar or axillary lymph nodes. The  previously demonstrated dominant left lower lobe pulmonary nodule has decreased in size and demonstrates no residual hypermetabolic activity. This nodule measures 9 mm on image 64/4 (previously 2.3 cm) and has an SUV max of 1.4. No hypermetabolic pulmonary activity or suspicious nodularity. A small right upper lobe ground-glass nodule is grossly stable, too small to evaluate by PET-CT. Incidental CT findings: Mild centrilobular emphysema. Atherosclerosis of the aorta, great vessels and coronary arteries. Moderate size hiatal hernia. ABDOMEN/PELVIS: There is no hypermetabolic activity within the liver, adrenal glands, spleen or pancreas. There is no hypermetabolic nodal activity in the abdomen or pelvis. Incidental CT findings: Aortic and branch vessel atherosclerosis. Mild distal colonic diverticulosis. SKELETON: There is no hypermetabolic activity to suggest osseous metastatic disease. The previously demonstrated subcutaneous metastases have resolved. Incidental CT findings: Mild spondylosis and mild thoracolumbar scoliosis. Minimal superior endplate compression deformity at L4 with low level metabolic activity. IMPRESSION: 1. Residual left lower lobe pulmonary nodule is unchanged from recent chest CTs and demonstrates no residual hypermetabolic activity, consistent with response to therapy.  2. No evidence of local recurrence or metastatic disease. 3. Aortic Atherosclerosis (ICD10-I70.0) and Emphysema (ICD10-J43.9). Electronically Signed   By: Carey Bullocks M.D.   On: 01/13/2024 16:29   DG Chest Port 1 View Result Date: 01/13/2024 CLINICAL DATA:  Cough and weakness. Urinary tract infection. History of non-small cell lung cancer. EXAM: PORTABLE CHEST 1 VIEW COMPARISON:  Radiographs 10/13/2023 and 01/20/2023. CT 09/15/2023. PET-CT 01/05/2024. FINDINGS: 1509 hours. The heart size and mediastinal contours are stable with aortic atherosclerosis and a moderate-sized hiatal hernia. The lungs appear clear. There is no pleural effusion or pneumothorax. The bones appear unremarkable. IMPRESSION: No evidence of acute cardiopulmonary process. Moderate-sized hiatal hernia. Electronically Signed   By: Carey Bullocks M.D.   On: 01/13/2024 16:04

## 2024-01-27 ENCOUNTER — Ambulatory Visit (INDEPENDENT_AMBULATORY_CARE_PROVIDER_SITE_OTHER): Payer: Medicare HMO | Admitting: Gastroenterology

## 2024-01-27 ENCOUNTER — Encounter: Payer: Self-pay | Admitting: Gastroenterology

## 2024-01-27 VITALS — BP 105/73 | HR 69 | Temp 97.4°F | Ht <= 58 in | Wt 99.2 lb

## 2024-01-27 DIAGNOSIS — K219 Gastro-esophageal reflux disease without esophagitis: Secondary | ICD-10-CM | POA: Diagnosis not present

## 2024-01-27 DIAGNOSIS — A0472 Enterocolitis due to Clostridium difficile, not specified as recurrent: Secondary | ICD-10-CM

## 2024-01-27 DIAGNOSIS — R197 Diarrhea, unspecified: Secondary | ICD-10-CM

## 2024-01-27 LAB — URINE CULTURE: Culture: NO GROWTH

## 2024-01-27 MED ORDER — VANCOMYCIN HCL 125 MG PO CAPS: ORAL_CAPSULE | ORAL | 0 refills | Status: DC

## 2024-01-27 NOTE — Progress Notes (Signed)
GI Office Note    Referring Provider: [name], MD Primary Care Physician:  [name], MD  Primary Gastroenterologist: Roetta Sessions, MD   Chief Complaint   Chief Complaint  Patient presents with   Follow-up    Doing much better    History of Present Illness   Patricia [name] is a 78 y.o. female presenting today for follow up. Last seen 12/2023. H/o GERD, diarrhea, lyphocytic colitits (diagnosed 06/2023), h/o Cdiff 11/2023 and treated for suspect recurrence shortly after with vancomycin tpaer.   Today: Diarrhea resolved 10 days ago but had some more when she drank cranberrry juice coule of days ago for bladder symptoms. She does have some lower abdominal pain, dysuria and has urine culture pending. Patient is a difficult historian, husband tries to supplement history. Believes her diarrhea resolved about 10 days. Ago. States she completed before vancomycin taper but she should be on three times a week right now. States they ran out of medication and had no refills. Heartburn well controlled. No vomiting, dysphagia. Notes she lost about 50 pounds with since her lung cancer illness. Seems to have stabilized around over the last 3 months.  Pertinent history:   Hospitalized back in July with colitis/diarrhea.  CT with findings consistent with colitis from splenic flexure to the level of the rectum.  Underwent a colonoscopy which showed friability with contact bleeding in the ascending colon and in the cecum, pathology showed lymphocytic colitis.  C. difficile testing negative. She was given budesonide 9 mg daily for 8 weeks.  Diarrhea resolved. Lost 50 pounds will   CT with contrast in 09/2023 with normal colon.   Admission in November with failure to thrive, nausea/vomiting/diarrhea in the setting of new chemotherapy drug adagrasib. Found to have transaminitis/acute hepatitis likely secondary to adagrasib.    Readmitted to the hospital in 11/2023, CT abdomen pelvis  revealed pancolitis.  C. difficile antigen positive, toxin negative but PCR was positive.  Initially was started on Dificid but the cost was too high and she was switched over to oral vancomycin.  Completing a 10-day course.      Colonoscopy July 2024: -Friability with contact bleeding in the ascending colon and in the cecum pathology consistent with lymphocytic colitis -Distal rectum and anal verge normal   EGD October 2022: -Normal esophagus -Medium sized hiatal hernia -Normal stomach -Normal duodenal bulb and second portion of duodenum    Wt Readings from Last 3 Encounters:  01/27/24 99 lb 3.2 oz (45 kg)  01/26/24 95 lb 8 oz (43.3 kg)  01/13/24 100 lb (45.4 kg)      Medications   Current Outpatient Medications  Medication Sig Dispense Refill   acetaminophen (TYLENOL) 500 MG tablet Take 1,000 mg by mouth every 6 (six) hours as needed for mild pain (pain score 1-3).     ALPRAZolam (XANAX) 0.5 MG tablet Take 1 tablet (0.5 mg total) by mouth at bedtime as needed for anxiety or sleep. TAKE (1) TABLET BY MOUTH AT BEDTIME AS NEEDED FOR SLEEP. (Patient taking differently: Take 0.5 mg by mouth at bedtime as needed for sleep. TAKE (1) TABLET BY MOUTH AT BEDTIME AS NEEDED FOR SLEEP.) 30 tablet 2   apixaban (ELIQUIS) 5 MG TABS tablet Take 1 tablet (5 mg total) by mouth 2 (two) times daily. 60 tablet 1   famotidine (PEPCID) 40 MG tablet TAKE 1 TABLET BY MOUTH EVERY DAY 90 tablet 1   HYDROcodone-acetaminophen (NORCO/VICODIN) 5-325 MG tablet Take 1  tablet by mouth every 8 (eight) hours as needed for moderate pain (pain score 4-6) or severe pain (pain score 7-10). 15 tablet 0   KLOR-CON M20 20 MEQ tablet TAKE 1 TABLET BY MOUTH THREE TIMES A DAY 270 tablet 1   lansoprazole (PREVACID) 30 MG capsule TAKE ONE CAPSULE BY MOUTH DAILY FOR ACID REFLUX 90 capsule 1   loperamide (IMODIUM) 2 MG capsule Take 2 mg by mouth as needed for diarrhea or loose stools.     PARoxetine (PAXIL) 40 MG tablet Take 40 mg  by mouth every morning.     rosuvastatin (CRESTOR) 20 MG tablet Take 20 mg by mouth at bedtime.     sertraline (ZOLOFT) 50 MG tablet TAKE 1 TABLET BY MOUTH EVERY DAY 90 tablet 1   topiramate (TOPAMAX) 100 MG tablet TAKE 1 TABLET BY MOUTH TWICE A DAY 180 tablet 0   No current facility-administered medications for this visit.    Allergies   Allergies as of 01/27/2024 - Review Complete 01/27/2024  Allergen Reaction Noted   Codeine Nausea Only 09/11/2011         Review of Systems   General: Negative for anorexia,  fever, chills, fatigue, +weakness. See hpi ENT: Negative for hoarseness, difficulty swallowing , nasal congestion. CV: Negative for chest pain, angina, palpitations, dyspnea on exertion, peripheral edema.  Respiratory: Negative for dyspnea at rest, dyspnea on exertion, cough, sputum, wheezing.  GI: See history of present illness. GU:  Negative for dysuria, hematuria, urinary incontinence, urinary frequency, nocturnal urination.  Endo: Negative for unusual weight change.     Physical Exam   BP 105/73 (BP Location: Right Arm, Patient Position: Sitting, Cuff Size: Normal)   Pulse 69   Temp (!) 97.4 F (36.3 C) (Oral)   Ht 4\' 8"  (1.422 m)   Wt 99 lb 3.2 oz (45 kg)   SpO2 96%   BMI 22.24 kg/m    General: frail appearing in no acute distress.  Eyes: No icterus. Mouth: Oropharyngeal mucosa moist and pink.  Abdomen: Bowel sounds are normal, nontender, nondistended, no hepatosplenomegaly or masses,  no abdominal bruits or hernia , no rebound or guarding.  Rectal: not performed  Extremities: No lower extremity edema. No clubbing or deformities. Neuro: Alert and oriented x 4   Skin: Warm and dry, no jaundice.   Psych: Alert and cooperative, normal mood and affect.  Labs   Lab Results  Component Value Date   NA 140 01/13/2024   CL 112 (H) 01/13/2024   K 4.1 01/13/2024   CO2 19 (L) 01/13/2024   BUN 18 01/13/2024   CREATININE 0.78 01/13/2024   GFRNONAA >60  01/13/2024   CALCIUM 9.1 01/13/2024   PHOS 2.5 06/23/2023   ALBUMIN 3.3 (L) 11/13/2023   GLUCOSE 104 (H) 01/13/2024   Lab Results  Component Value Date   ALT 24 11/13/2023   AST 19 11/13/2023   ALKPHOS 107 11/13/2023   BILITOT 0.9 11/13/2023   Lab Results  Component Value Date   WBC 6.1 01/13/2024   HGB 13.5 01/13/2024   HCT 42.8 01/13/2024   MCV 94.1 01/13/2024   PLT 212 01/13/2024    Imaging Studies   CT ABDOMEN PELVIS W CONTRAST Result Date: 01/13/2024 CLINICAL DATA:  UTI sore throat cough EXAM: CT ABDOMEN AND PELVIS WITH CONTRAST TECHNIQUE: Multidetector CT imaging of the abdomen and pelvis was performed using the standard protocol following bolus administration of intravenous contrast. RADIATION DOSE REDUCTION: This exam was performed according to the departmental dose-optimization  program which includes automated exposure control, adjustment of the mA and/or kV according to patient size and/or use of iterative reconstruction technique. CONTRAST:  80mL OMNIPAQUE IOHEXOL 300 MG/ML  SOLN COMPARISON:  PET CT 01/05/2024, CT 11/12/2023 FINDINGS: Lower chest: Lung bases demonstrate no acute airspace disease. Moderate hiatal hernia Hepatobiliary: No focal liver abnormality is seen. No gallstones, gallbladder wall thickening, or biliary dilatation. Pancreas: Unremarkable. No pancreatic ductal dilatation or surrounding inflammatory changes. Spleen: Normal in size without focal abnormality. Adrenals/Urinary Tract: Adrenal glands are normal. Kidneys show no hydronephrosis. Scarring at the lower pole of the left kidney. Mild diffuse bladder wall thickening with mucosal enhancement and perivesical stranding Stomach/Bowel: Stomach nonenlarged. Few air-filled nondistended loops of small bowel. Fluid throughout the colon. No acute bowel wall thickening. Vascular/Lymphatic: Moderate aortic atherosclerosis. No aneurysm. No suspicious lymph nodes Reproductive: Hysterectomy.  No adnexal mass Other: Negative  for pelvic effusion or free air Musculoskeletal: Acute appearing mild superior endplate fracture at L4 IMPRESSION: 1. Mild diffuse bladder wall thickening with mucosal enhancement and perivesical stranding, findings suggestive of cystitis. 2. Fluid throughout the colon suggesting diarrhea. No acute bowel wall thickening 3. Acute appearing mild superior endplate fracture at L4. 4. Moderate hiatal hernia. 5. Aortic atherosclerosis. Aortic Atherosclerosis (ICD10-I70.0). Electronically Signed   By: Jasmine Pang M.D.   On: 01/13/2024 17:35   NM PET Image Restag (PS) Skull Base To Thigh Result Date: 01/13/2024 CLINICAL DATA:  Subsequent treatment strategy for non-small cell lung cancer. EXAM: NUCLEAR MEDICINE PET SKULL BASE TO THIGH TECHNIQUE: 5.24 mCi F-18 FDG was injected intravenously. Full-ring PET imaging was performed from the skull base to thigh after the radiotracer. CT data was obtained and used for attenuation correction and anatomic localization. Fasting blood glucose: 102 mg/dl COMPARISON:  PET-CT 09/81/1914. Abdominopelvic CT 11/13/2023 and chest CT 09/15/2023. FINDINGS: Mediastinal blood pool activity: SUV max 2.2 NECK: No hypermetabolic cervical lymph nodes are identified. No suspicious activity identified within the pharyngeal mucosal space. Incidental CT findings: Bilateral carotid atherosclerosis. CHEST: There are no hypermetabolic mediastinal, hilar or axillary lymph nodes. The previously demonstrated dominant left lower lobe pulmonary nodule has decreased in size and demonstrates no residual hypermetabolic activity. This nodule measures 9 mm on image 64/4 (previously 2.3 cm) and has an SUV max of 1.4. No hypermetabolic pulmonary activity or suspicious nodularity. A small right upper lobe ground-glass nodule is grossly stable, too small to evaluate by PET-CT. Incidental CT findings: Mild centrilobular emphysema. Atherosclerosis of the aorta, great vessels and coronary arteries. Moderate size hiatal  hernia. ABDOMEN/PELVIS: There is no hypermetabolic activity within the liver, adrenal glands, spleen or pancreas. There is no hypermetabolic nodal activity in the abdomen or pelvis. Incidental CT findings: Aortic and branch vessel atherosclerosis. Mild distal colonic diverticulosis. SKELETON: There is no hypermetabolic activity to suggest osseous metastatic disease. The previously demonstrated subcutaneous metastases have resolved. Incidental CT findings: Mild spondylosis and mild thoracolumbar scoliosis. Minimal superior endplate compression deformity at L4 with low level metabolic activity. IMPRESSION: 1. Residual left lower lobe pulmonary nodule is unchanged from recent chest CTs and demonstrates no residual hypermetabolic activity, consistent with response to therapy. 2. No evidence of local recurrence or metastatic disease. 3. Aortic Atherosclerosis (ICD10-I70.0) and Emphysema (ICD10-J43.9). Electronically Signed   By: Carey Bullocks M.D.   On: 01/13/2024 16:29   DG Chest Port 1 View Result Date: 01/13/2024 CLINICAL DATA:  Cough and weakness. Urinary tract infection. History of non-small cell lung cancer. EXAM: PORTABLE CHEST 1 VIEW COMPARISON:  Radiographs 10/13/2023 and 01/20/2023.  CT 09/15/2023. PET-CT 01/05/2024. FINDINGS: 1509 hours. The heart size and mediastinal contours are stable with aortic atherosclerosis and a moderate-sized hiatal hernia. The lungs appear clear. There is no pleural effusion or pneumothorax. The bones appear unremarkable. IMPRESSION: No evidence of acute cardiopulmonary process. Moderate-sized hiatal hernia. Electronically Signed   By: Carey Bullocks M.D.   On: 01/13/2024 16:04    Assessment    Diarrhea:  -lymphocytic colitis diagnosed in 06/2023, treated with budesonide with resolultion of diarrhea, per oncology, "not entirely clear if pembrolizumab induced colitis as her colonoscopy and biopsy showed lymphocytic colitis. Immunotherapy induced colitis usually has  neutrophilic colitis."  -recurrence of diarrhea 11/2023, Cdiff positive. Treated with vancomycin. -call with recurrent diarrhea off vancomycin, started on vancomycin taper 12/01/23, should be on three times weekly dosing right now but states they ran out and no refills. Unclear if taken incorrectly or not dispenses completely -diarrhea much improved, more normal for about 10 days but had diarrhea after cranberry juice.  -at risk for recurrence, complete vanc taper, new rx provided -call if recurrent diarrhea lasting more than 48 hours -ov prn per patient request    GERD: -controlled on lansoprazole 30mg  am, she cannot go without, states her symptoms are severe without PPI. -return ov in one year       Leanna Battles. Melvyn Neth, MHS, PA-C [facility]

## 2024-01-27 NOTE — Patient Instructions (Addendum)
Finish vancomycin taper, take one capsule three time a week (Monday, Wednesday, Friday) for 8 weeks. New prescription sent to pharmacy. Continue lansoprazole for acid reflux. Call if you have recurrent diarrhea lasting more than 48 hours. At that time would suggest checking again for Cdiff infection. Return to the office as needed.

## 2024-01-28 ENCOUNTER — Other Ambulatory Visit (HOSPITAL_COMMUNITY): Payer: Self-pay

## 2024-02-03 ENCOUNTER — Ambulatory Visit: Payer: Medicare HMO | Admitting: Family Medicine

## 2024-02-14 ENCOUNTER — Other Ambulatory Visit: Payer: Self-pay | Admitting: Family Medicine

## 2024-02-14 ENCOUNTER — Other Ambulatory Visit: Payer: Self-pay | Admitting: Internal Medicine

## 2024-02-18 ENCOUNTER — Ambulatory Visit: Payer: Medicare HMO | Admitting: Family Medicine

## 2024-02-19 ENCOUNTER — Ambulatory Visit: Payer: Medicare HMO | Admitting: Nurse Practitioner

## 2024-02-19 ENCOUNTER — Encounter: Payer: Self-pay | Admitting: Nurse Practitioner

## 2024-02-19 VITALS — BP 136/83 | HR 72 | Temp 97.2°F | Ht <= 58 in | Wt 97.2 lb

## 2024-02-19 DIAGNOSIS — R3915 Urgency of urination: Secondary | ICD-10-CM | POA: Diagnosis not present

## 2024-02-19 DIAGNOSIS — R300 Dysuria: Secondary | ICD-10-CM | POA: Diagnosis not present

## 2024-02-19 DIAGNOSIS — R350 Frequency of micturition: Secondary | ICD-10-CM

## 2024-02-19 MED ORDER — PHENAZOPYRIDINE HCL 100 MG PO TABS: 100.0000 mg | ORAL_TABLET | Freq: Three times a day (TID) | ORAL | 0 refills | Status: DC | PRN

## 2024-02-19 NOTE — Progress Notes (Signed)
 Subjective:    Patient ID: Patricia [name], female    DOB: 1946-11-28, 78 y.o.   MRN: [id number]  HPI Patricia [name] is being seen today for burning with urination and bladder spasms. Has had this for 2-3 months. Says that she feels like it is "burning on the inside". The pain is worse later in the day, and improves in the morning. Feel pressure with frequency and voiding small amounts. Had one episode of blood in her urine, none recently. Has been seen three times for a UTI in the past three months. Had two urine cultures recently and they were negative. Denies vaginal irritation, vaginal discharge, or vaginal itching. Has been trying to increase water intake. Denies any aggravating factors. Tried a dose of prescription pyridium per a relative and said it improved symptoms. Reports that the OTC pyridium makes it worse.    Review of Systems  Constitutional:  Negative for fatigue and fever.  Respiratory:  Negative for cough, chest tightness, shortness of breath and wheezing.   Cardiovascular:  Negative for chest pain.  Genitourinary:  Positive for dysuria and urgency. Negative for decreased urine volume, flank pain, frequency, hematuria, pelvic pain, vaginal discharge and vaginal pain.      Objective:   Physical Exam Vitals and nursing note reviewed.  Constitutional:      General: She is not in acute distress.    Appearance: Normal appearance. She is not ill-appearing.  Cardiovascular:     Rate and Rhythm: Normal rate and regular rhythm.     Heart sounds: Normal heart sounds, S1 normal and S2 normal. No murmur heard. Pulmonary:     Effort: Pulmonary effort is normal. No respiratory distress.     Breath sounds: Normal breath sounds. No wheezing.  Abdominal:     General: There is no distension.     Palpations: Abdomen is soft.     Tenderness: There is abdominal tenderness in the suprapubic area. There is no right CVA tenderness or left CVA tenderness.  Neurological:     Mental Status: She is  alert.    Vitals:   02/19/24 0840  BP: 136/83  Pulse: 72  Temp: (!) 97.2 F (36.2 C)  Height: 4\' 8"  (1.422 m)  Weight: 97 lb 3.2 oz (44.1 kg)  SpO2: 99%  BMI (Calculated): 21.8       02/19/2024    8:56 AM 01/08/2024    3:42 PM 12/25/2023    2:34 PM 12/15/2023    3:49 PM 11/21/2023    9:22 AM  Depression screen PHQ 2/9  Decreased Interest 3 0 3 3 0  Down, Depressed, Hopeless 3 0 3 2 0  PHQ - 2 Score 6 0 6 5 0  Altered sleeping 2 0 3 3 0  Tired, decreased energy 3 3 3 2  0  Change in appetite 2 1 3 3  0  Feeling bad or failure about yourself  2 0 3 2 0  Trouble concentrating 3 0 3 2 0  Moving slowly or fidgety/restless 0 0 3 3 0  Suicidal thoughts 3 0 0 0 0  PHQ-9 Score 21 4 24 20  0  Difficult doing work/chores  Very difficult Somewhat difficult Somewhat difficult        02/19/2024    8:57 AM 01/08/2024    3:42 PM 12/25/2023    2:34 PM 12/15/2023    3:49 PM  GAD 7 : Generalized Anxiety Score  Nervous, Anxious, on Edge 0 0 3 2  Control/stop worrying 1 0  3 2  Worry too much - different things 1 0 3 2  Trouble relaxing 2 0 3 2  Restless 1 0 0 0  Easily annoyed or irritable 1 0 2 2  Afraid - awful might happen 0 0 0 0  Total GAD 7 Score 6 0 14 10  Anxiety Difficulty Very difficult Not difficult at all Somewhat difficult Somewhat difficult      Assessment & Plan:  1. Dysuria (Primary) - Ambulatory referral to Urology  2. Urinary frequency - Ambulatory referral to Urology  3. Urinary urgency - Ambulatory referral to Urology   Due to the absence of vaginal symptom, will hold on pelvic until her upcoming physical.  Last two urine cultures were negative, and patient has been on Vancomycin for C.diff infection. Have low suspicion of UTI, and suspect interstitial cystitis. Refer to Urology.   Meds ordered this encounter  Medications   phenazopyridine (PYRIDIUM) 100 MG tablet    Sig: Take 1 tablet (100 mg total) by mouth 3 (three) times daily as needed for pain.     Dispense:  10 tablet    Refill:  0    Supervising Provider:   [name] [phone]  Advised patient that this medication is not for long term use. She plans to take it once daily.  Warning signs reviewed. Call back if worsening or new symptoms.  Return for physical on 03/18/24.

## 2024-02-20 ENCOUNTER — Ambulatory Visit: Payer: Self-pay | Admitting: Family Medicine

## 2024-02-20 NOTE — Telephone Encounter (Signed)
 Pt's husband reports she had 200 mg prescription she "borrowed some from her grandaughter in law" and it works really well for her. He is requesting a script sent to her pharmacy for the 200 mg Pyridium as the 100 mg tablets don't seem to be giving her any relief. Routing to provider for review.  Copied from CRM [id number]. Topic: Clinical - Medical Advice >> Feb 20, 2024 12:21 PM [place] B wrote: Reason for CRM: pt's husband called in , states med for UTI the 100mg  isnt , working but  he says the 200mg  did, so asking for 200mg . He didn't have the name of the med Reason for Disposition  [1] Pharmacy calling with prescription question AND [2] triager unable to answer question  Answer Assessment - Initial Assessment Questions 1. NAME of MEDICINE: "What medicine(s) are you calling about?"     Pyridium 2. QUESTION: "What is your question?" (e.g., double dose of medicine, side effect)     Would like increased dosage 3. PRESCRIBER: "Who prescribed the medicine?" Reason: if prescribed by specialist, call should be referred to that group.     CIsabell Jarvis 4. SYMPTOMS: "Do you have any symptoms?" If Yes, ask: "What symptoms are you having?"  "How bad are the symptoms (e.g., mild, moderate, severe)     Symptoms are the same, no better/worse  Protocols used: Medication Question Call-A-AH

## 2024-02-21 ENCOUNTER — Encounter: Payer: Self-pay | Admitting: Nurse Practitioner

## 2024-02-21 NOTE — Telephone Encounter (Signed)
 Mychart message sent.

## 2024-02-27 ENCOUNTER — Other Ambulatory Visit: Payer: Self-pay | Admitting: Nurse Practitioner

## 2024-03-04 ENCOUNTER — Other Ambulatory Visit: Payer: Self-pay | Admitting: Internal Medicine

## 2024-03-05 ENCOUNTER — Other Ambulatory Visit: Payer: Self-pay

## 2024-03-17 ENCOUNTER — Ambulatory Visit: Admitting: Family Medicine

## 2024-03-18 ENCOUNTER — Encounter: Payer: Self-pay | Admitting: Nurse Practitioner

## 2024-03-18 ENCOUNTER — Ambulatory Visit: Admitting: Nurse Practitioner

## 2024-03-18 VITALS — BP 127/78 | HR 71 | Ht <= 58 in | Wt 99.0 lb

## 2024-03-18 DIAGNOSIS — Z0001 Encounter for general adult medical examination with abnormal findings: Secondary | ICD-10-CM

## 2024-03-18 DIAGNOSIS — R300 Dysuria: Secondary | ICD-10-CM

## 2024-03-18 DIAGNOSIS — E782 Mixed hyperlipidemia: Secondary | ICD-10-CM | POA: Diagnosis not present

## 2024-03-18 DIAGNOSIS — R319 Hematuria, unspecified: Secondary | ICD-10-CM

## 2024-03-18 DIAGNOSIS — M8580 Other specified disorders of bone density and structure, unspecified site: Secondary | ICD-10-CM | POA: Diagnosis not present

## 2024-03-18 DIAGNOSIS — Z0000 Encounter for general adult medical examination without abnormal findings: Secondary | ICD-10-CM

## 2024-03-18 DIAGNOSIS — I10 Essential (primary) hypertension: Secondary | ICD-10-CM | POA: Diagnosis not present

## 2024-03-18 DIAGNOSIS — Z1231 Encounter for screening mammogram for malignant neoplasm of breast: Secondary | ICD-10-CM

## 2024-03-18 DIAGNOSIS — E039 Hypothyroidism, unspecified: Secondary | ICD-10-CM

## 2024-03-18 LAB — POCT URINALYSIS DIP (CLINITEK)
Bilirubin, UA: NEGATIVE
Glucose, UA: NEGATIVE mg/dL
Ketones, POC UA: NEGATIVE mg/dL
Leukocytes, UA: NEGATIVE
Nitrite, UA: NEGATIVE
Spec Grav, UA: 1.02 (ref 1.010–1.025)
Urobilinogen, UA: 0.2 U/dL
pH, UA: 6 (ref 5.0–8.0)

## 2024-03-18 MED ORDER — POTASSIUM CHLORIDE CRYS ER 20 MEQ PO TBCR: 20.0000 meq | EXTENDED_RELEASE_TABLET | Freq: Three times a day (TID) | ORAL | 1 refills | Status: DC

## 2024-03-18 MED ORDER — SERTRALINE HCL 100 MG PO TABS: 100.0000 mg | ORAL_TABLET | Freq: Every day | ORAL | 0 refills | Status: DC

## 2024-03-18 NOTE — Patient Instructions (Addendum)
 Make sure you have stopped Paroxetine since you are on Sertraline Increase Sertraline to 2 of the 50 mg pills once a day (100 mg total) Will send in a new prescription with this dose Schedule eye exam with her optometrist Call number you are given to schedule mammogram and bone density

## 2024-03-18 NOTE — Progress Notes (Signed)
 Subjective:    Patient ID: Patricia [name], female    DOB: November 03, 1946, 78 y.o.   MRN: [id number]  HPI  The patient comes in today for a wellness visit.    A review of their health history was completed.  A review of medications was also completed.  Any needed refills; yes  Eating habits: fair; improved appetite  Falls/  MVA accidents in past few months: no  Regular exercise: none; difficulty due to health issues  Specialist pt sees on regular basis: gastro, oncology  Preventative health issues were discussed.   Additional concerns: Has been socially isolated mainly at home.  Does not go to church anymore.  Her husband has asked her to go out on occasion and patient usually declines.  States she has felt more depressed lately.  Was switched from paroxetine to sertraline 50 mg 1-1/2 tabs daily.  Would like to increase dosage.  Denies suicidal thoughts or ideation.  Appetite has improved, now eating 1 regular meal per day.  Review of Systems  Constitutional:  Positive for fatigue. Negative for fever.  HENT:  Negative for dental problem, mouth sores, sore throat and trouble swallowing.        Wears dentures.  Denies any oral lesions or sores.  Respiratory:  Negative for cough, chest tightness, shortness of breath and wheezing.   Cardiovascular:  Negative for chest pain.  Gastrointestinal:  Negative for abdominal distention, abdominal pain, constipation, diarrhea, nausea and vomiting.  Genitourinary:  Positive for dysuria, frequency and urgency. Negative for difficulty urinating, genital sores, pelvic pain and vaginal discharge.       Taking Pyridium once a day to control her symptoms. Has upcoming appointment with urology.       03/18/2024   10:46 AM  Depression screen PHQ 2/9  Decreased Interest 0  Down, Depressed, Hopeless 2  PHQ - 2 Score 2  Altered sleeping 0  Tired, decreased energy 0  Change in appetite 0  Feeling bad or failure about yourself  0  Trouble  concentrating 0  Moving slowly or fidgety/restless 0  Suicidal thoughts 2  PHQ-9 Score 4  Difficult doing work/chores Somewhat difficult      02/19/2024    8:57 AM 01/08/2024    3:42 PM 12/25/2023    2:34 PM 12/15/2023    3:49 PM  GAD 7 : Generalized Anxiety Score  Nervous, Anxious, on Edge 0 0 3 2  Control/stop worrying 1 0 3 2  Worry too much - different things 1 0 3 2  Trouble relaxing 2 0 3 2  Restless 1 0 0 0  Easily annoyed or irritable 1 0 2 2  Afraid - awful might happen 0 0 0 0  Total GAD 7 Score 6 0 14 10  Anxiety Difficulty Very difficult Not difficult at all Somewhat difficult Somewhat difficult    Social History   Tobacco Use   Smoking status: Former    Current packs/day: 0.00    Average packs/day: 1 pack/day for 20.0 years (20.0 ttl pk-yrs)    Types: Cigarettes    Start date: 09/10/1964    Quit date: 09/10/1984    Years since quitting: 39.5   Smokeless tobacco: Never  Vaping Use   Vaping status: Never Used  Substance Use Topics   Alcohol use: No   Drug use: No          Objective:   Physical Exam Vitals and nursing note reviewed. Exam conducted with a chaperone present (Husband present  for breast exam.).  Constitutional:      General: She is not in acute distress.    Appearance: She is well-developed.  Neck:     Thyroid: No thyromegaly.     Trachea: No tracheal deviation.     Comments: Thyroid non tender to palpation. No mass or goiter noted.  Cardiovascular:     Rate and Rhythm: Normal rate and regular rhythm.     Heart sounds: Normal heart sounds. No murmur heard. Pulmonary:     Effort: Pulmonary effort is normal.     Breath sounds: Normal breath sounds.  Chest:  Breasts:    Right: No swelling, inverted nipple, mass, skin change or tenderness.     Left: No swelling, inverted nipple, mass, skin change or tenderness.  Abdominal:     General: There is no distension.     Palpations: Abdomen is soft.     Tenderness: There is no abdominal  tenderness.  Musculoskeletal:     Cervical back: Normal range of motion and neck supple.  Lymphadenopathy:     Cervical: No cervical adenopathy.     Upper Body:     Right upper body: No supraclavicular, axillary or pectoral adenopathy.     Left upper body: No supraclavicular, axillary or pectoral adenopathy.  Skin:    General: Skin is warm and dry.     Findings: No rash.  Neurological:     Mental Status: She is alert and oriented to person, place, and time.  Psychiatric:        Attention and Perception: Attention and perception normal.        Mood and Affect: Mood is depressed.        Speech: Speech normal.        Behavior: Behavior normal.        Thought Content: Thought content normal.        Cognition and Memory: Cognition and memory normal.        Judgment: Judgment normal.   Slightly depressed mood.  Smiling and laughing at times. Results for orders placed or performed in visit on 03/18/24  POCT URINALYSIS DIP (CLINITEK)   Collection Time: 03/18/24 11:05 AM  Result Value Ref Range   Color, UA yellow yellow   Clarity, UA clear clear   Glucose, UA negative negative mg/dL   Bilirubin, UA negative negative   Ketones, POC UA negative negative mg/dL   Spec Grav, UA 1.914 7.829 - 1.025   Blood, UA large (A) negative   pH, UA 6.0 5.0 - 8.0   POC PROTEIN,UA trace negative, trace   Urobilinogen, UA 0.2 0.2 or 1.0 E.U./dL   Nitrite, UA Negative Negative   Leukocytes, UA Negative Negative   Today's Vitals   03/18/24 1050  BP: 127/78  Pulse: 71  SpO2: 96%  Weight: 99 lb (44.9 kg)  Height: 4\' 8"  (1.422 m)   Body mass index is 22.2 kg/m. Patient has gained 2 pounds since her last appointment.       Assessment & Plan:   Problem List Items Addressed This Visit       Cardiovascular and Mediastinum   Primary hypertension   Relevant Orders   Microalbumin / creatinine urine ratio     Endocrine   Acquired hypothyroidism     Musculoskeletal and Integument    Osteopenia (Chronic)   Relevant Orders   DG Bone Density     Other   Hematuria of unknown etiology   Mixed hyperlipidemia   Relevant Orders  Lipid panel   Other Visit Diagnoses       Annual physical exam    -  Primary     Dysuria       Relevant Orders   POCT URINALYSIS DIP (CLINITEK) (Completed)     Encounter for screening mammogram for malignant neoplasm of breast       Relevant Orders   MM 3D SCREENING MAMMOGRAM BILATERAL BREAST      Meds ordered this encounter  Medications   potassium chloride SA (KLOR-CON M20) 20 MEQ tablet    Sig: Take 1 tablet (20 mEq total) by mouth 3 (three) times daily.    Dispense:  270 tablet    Refill:  1    Supervising Provider:   Lilyan Punt A [[id number]]   sertraline (ZOLOFT) 100 MG tablet    Sig: Take 1 tablet (100 mg total) by mouth daily.    Dispense:  90 tablet    Refill:  0    Supervising Provider:   Lilyan Punt A [[id number]]    Lipid and urine ACR ordered today.  Other labs are done by her specialist. Urine dip shows no evidence of infection but persistent hematuria.  Patient to follow-up with urology next week as planned. Make sure you have stopped Paroxetine since you are on Sertraline Increase Sertraline to 2 of the 50 mg pills once a day (100 mg total).  Contact office if any significant side effects or if no improvement in her depression symptoms.  Patient defers referral for counseling at this time.  Lengthy discussion regarding getting out of the house going out on short ventures with her husband even if she does not get out of the car.  Concerned about social isolation.  Also consider getting back to church to see her friends. Will send in a new prescription with the new Sertraline dose. Schedule eye exam with her optometrist Call number you are given to schedule mammogram and bone density. Return in about 3 months (around 06/17/2024).

## 2024-03-19 ENCOUNTER — Other Ambulatory Visit: Payer: Self-pay | Admitting: Family Medicine

## 2024-03-19 LAB — LIPID PANEL
Chol/HDL Ratio: 2.9 ratio (ref 0.0–4.4)
Cholesterol, Total: 212 mg/dL — ABNORMAL HIGH (ref 100–199)
HDL: 72 mg/dL (ref >39)
LDL Chol Calc (NIH): 117 mg/dL — ABNORMAL HIGH (ref 0–99)
Triglycerides: 135 mg/dL (ref 0–149)
VLDL Cholesterol Cal: 23 mg/dL (ref 5–40)

## 2024-03-19 LAB — MICROALBUMIN / CREATININE URINE RATIO
Creatinine, Urine: 125.4 mg/dL
Microalb/Creat Ratio: 29 mg/g{creat} (ref 0–29)
Microalbumin, Urine: 36 ug/mL

## 2024-03-20 ENCOUNTER — Encounter: Payer: Self-pay | Admitting: Nurse Practitioner

## 2024-03-23 ENCOUNTER — Ambulatory Visit: Admitting: Urology

## 2024-04-14 ENCOUNTER — Ambulatory Visit (HOSPITAL_COMMUNITY)
Admission: RE | Admit: 2024-04-14 | Discharge: 2024-04-14 | Disposition: A | Discharge status: Home / Self Care | Source: Ambulatory Visit | Attending: Nurse Practitioner | Admitting: Nurse Practitioner

## 2024-04-14 ENCOUNTER — Encounter (HOSPITAL_COMMUNITY): Payer: Self-pay

## 2024-04-14 DIAGNOSIS — M8580 Other specified disorders of bone density and structure, unspecified site: Secondary | ICD-10-CM | POA: Diagnosis not present

## 2024-04-14 DIAGNOSIS — Z13820 Encounter for screening for osteoporosis: Secondary | ICD-10-CM | POA: Diagnosis not present

## 2024-04-14 DIAGNOSIS — Z1231 Encounter for screening mammogram for malignant neoplasm of breast: Secondary | ICD-10-CM | POA: Diagnosis not present

## 2024-04-14 DIAGNOSIS — Z780 Asymptomatic menopausal state: Secondary | ICD-10-CM | POA: Diagnosis not present

## 2024-04-14 DIAGNOSIS — M8589 Other specified disorders of bone density and structure, multiple sites: Secondary | ICD-10-CM | POA: Diagnosis not present

## 2024-04-14 HISTORY — DX: Malignant (primary) neoplasm, unspecified: C80.1

## 2024-04-15 ENCOUNTER — Inpatient Hospital Stay
Admission: RE | Admit: 2024-04-15 | Discharge: 2024-04-15 | Disposition: A | Discharge status: Home / Self Care | Payer: Self-pay | Source: Ambulatory Visit | Attending: Nurse Practitioner | Admitting: Nurse Practitioner

## 2024-04-15 ENCOUNTER — Encounter: Payer: Self-pay | Admitting: Nurse Practitioner

## 2024-04-15 ENCOUNTER — Other Ambulatory Visit: Payer: Self-pay | Admitting: Nurse Practitioner

## 2024-04-15 DIAGNOSIS — Z1231 Encounter for screening mammogram for malignant neoplasm of breast: Secondary | ICD-10-CM

## 2024-04-15 DIAGNOSIS — Z9189 Other specified personal risk factors, not elsewhere classified: Secondary | ICD-10-CM | POA: Insufficient documentation

## 2024-04-20 NOTE — Telephone Encounter (Signed)
 NA

## 2024-04-22 ENCOUNTER — Inpatient Hospital Stay: Payer: Medicare HMO

## 2024-04-22 ENCOUNTER — Ambulatory Visit (HOSPITAL_COMMUNITY)
Admission: RE | Admit: 2024-04-22 | Discharge: 2024-04-22 | Disposition: A | Discharge status: Home / Self Care | Payer: Medicare HMO | Source: Ambulatory Visit | Attending: Hematology | Admitting: Hematology

## 2024-04-22 DIAGNOSIS — M47819 Spondylosis without myelopathy or radiculopathy, site unspecified: Secondary | ICD-10-CM | POA: Diagnosis not present

## 2024-04-22 DIAGNOSIS — C3492 Malignant neoplasm of unspecified part of left bronchus or lung: Secondary | ICD-10-CM | POA: Insufficient documentation

## 2024-04-22 DIAGNOSIS — N200 Calculus of kidney: Secondary | ICD-10-CM | POA: Diagnosis not present

## 2024-04-22 DIAGNOSIS — K828 Other specified diseases of gallbladder: Secondary | ICD-10-CM | POA: Diagnosis not present

## 2024-04-22 DIAGNOSIS — C3490 Malignant neoplasm of unspecified part of unspecified bronchus or lung: Secondary | ICD-10-CM | POA: Diagnosis not present

## 2024-04-22 MED ORDER — FLUDEOXYGLUCOSE F - 18 (FDG) INJECTION
4.4700 | Freq: Once | INTRAVENOUS | Status: AC | PRN
  Administered 2024-04-22: 4.47 via INTRAVENOUS

## 2024-04-23 ENCOUNTER — Ambulatory Visit: Admitting: Urology

## 2024-04-23 ENCOUNTER — Encounter: Payer: Self-pay | Admitting: Urology

## 2024-04-23 VITALS — BP 100/70 | HR 75

## 2024-04-23 DIAGNOSIS — N3281 Overactive bladder: Secondary | ICD-10-CM | POA: Diagnosis not present

## 2024-04-23 DIAGNOSIS — R350 Frequency of micturition: Secondary | ICD-10-CM | POA: Diagnosis not present

## 2024-04-23 DIAGNOSIS — R300 Dysuria: Secondary | ICD-10-CM | POA: Diagnosis not present

## 2024-04-23 LAB — URINALYSIS, ROUTINE W REFLEX MICROSCOPIC
Bilirubin, UA: NEGATIVE
Glucose, UA: NEGATIVE
Ketones, UA: NEGATIVE
Nitrite, UA: NEGATIVE
Specific Gravity, UA: 1.025 (ref 1.005–1.030)
Urobilinogen, Ur: 0.2 mg/dL (ref 0.2–1.0)
pH, UA: 6 (ref 5.0–7.5)

## 2024-04-23 LAB — MICROSCOPIC EXAMINATION: RBC, Urine: >30 /HPF — AB (ref 0–2)

## 2024-04-23 MED ORDER — GEMTESA 75 MG PO TABS: 1.0000 | ORAL_TABLET | Freq: Every day | ORAL | Status: DC

## 2024-04-23 NOTE — Progress Notes (Signed)
 04/23/2024 10:09 AM   Patricia [name] 20-Jun-1946 [id number]  Referring provider: Derenda Flax, NP [address] [place],  [place] [address]  Urinary urgency   HPI: [name] is a 78yo here for evaluation of frequent UTI and dysuria. She has had dysuria and concern fro UTis since August 2024. She has had 5 urine culture which showed no growth. She has pelvic pain and dysuria with each urination.  She had gross hematuria 2-3 months ago. She does not wear pads. She has a hx of metastatic adenocarcinoma of the lung. CT 01/13/24 shows a thick walled bladder   PMH: Past Medical History:  Diagnosis Date   Cancer (HCC)    Depression    GERD (gastroesophageal reflux disease)    Hypercholesterolemia    Hypokalemia 06/23/2023   IFG (impaired fasting glucose)    Nonintractable episodic headache 01/18/2023   Osteopenia    PONV (postoperative nausea and vomiting)    Stroke [facility])     Surgical History: Past Surgical History:  Procedure Laterality Date   ABDOMINAL HYSTERECTOMY     BIOPSY  06/24/2023   Procedure: BIOPSY;  Surgeon: Urban Garden, MD;  Location: [facility];  Service: Gastroenterology;;   CATARACT EXTRACTION W/PHACO  09/12/2011   Procedure: CATARACT EXTRACTION PHACO AND INTRAOCULAR LENS PLACEMENT (IOC);  Surgeon: Anner Kill;  Location: [facility] ORS;  Service: Ophthalmology;  Laterality: Left;  CDE: 8.91   CATARACT EXTRACTION W/PHACO  11/18/2011   Procedure: CATARACT EXTRACTION PHACO AND INTRAOCULAR LENS PLACEMENT (IOC);  Surgeon: Anner Kill;  Location: [facility] OR[facility];  Service: Ophthalmology;  Laterality: Right;  CDE:10.26   COLONOSCOPY  2007   Dr. Riley Cheadle: internal hemorrhoids, single anal papilla poor prep.    COLONOSCOPY N/A 03/27/2017   Rourk: Normal exam.   COLONOSCOPY  06/24/2023   Procedure: COLONOSCOPY;  Surgeon: Urban Garden, MD;  Location: [facility];  Service: Gastroenterology;;   ECTOPIC PREGNANCY SURGERY     removal of right tube    ESOPHAGOGASTRODUODENOSCOPY (EGD) WITH PROPOFOL  N/A 09/10/2021   Procedure: ESOPHAGOGASTRODUODENOSCOPY (EGD) WITH PROPOFOL ;  Surgeon: Suzette Espy, MD;  [facility];  Service: Endoscopy;  Laterality: N/A;  8:15AM   MALONEY DILATION N/A 09/10/2021   Procedure: Londa Rival DILATION;  Surgeon: Suzette Espy, MD;  [facility];  Service: Endoscopy;  Laterality: N/A;   OVARIAN CYST REMOVAL     right   RECTOCELE REPAIR N/A 01/18/2014   Procedure: POSTERIOR REPAIR (RECTOCELE);  Surgeon: Albino Hum, MD;  Location: [facility];  Service: Gynecology;  Laterality: N/A;   VAGINAL HYSTERECTOMY N/A 01/18/2014   Procedure: HYSTERECTOMY VAGINAL;  Surgeon: Albino Hum, MD;  Location: [facility];  Service: Gynecology;  Laterality: N/A;    Home Medications:  Allergies as of 04/23/2024       Reactions   Codeine Nausea Only        Medication List        Accurate as of Apr 23, 2024 10:09 AM. If you have any questions, ask your nurse or doctor.          acetaminophen  500 MG tablet Commonly known as: TYLENOL  Take 1,000 mg by mouth every 6 (six) hours as needed for mild pain (pain score 1-3).   ALPRAZolam  0.5 MG tablet Commonly known as: XANAX  Take 1 tablet (0.5 mg total) by mouth at bedtime as needed for anxiety or sleep. TAKE (1) TABLET BY MOUTH AT BEDTIME AS NEEDED FOR SLEEP. What changed: reasons  to take this   apixaban  5 MG Tabs tablet Commonly known as: ELIQUIS  Take 1 tablet (5 mg total) by mouth 2 (two) times daily.   famotidine  40 MG tablet Commonly known as: PEPCID  TAKE 1 TABLET BY MOUTH EVERY DAY   HYDROcodone -acetaminophen  5-325 MG tablet Commonly known as: NORCO/VICODIN Take 1 tablet by mouth every 8 (eight) hours as needed for moderate pain (pain score 4-6) or severe pain (pain score 7-10).   lansoprazole  30 MG capsule Commonly known as: PREVACID  TAKE ONE CAPSULE BY MOUTH DAILY FOR ACID REFLUX   loperamide 2 MG capsule Commonly known as:  IMODIUM Take 2 mg by mouth as needed for diarrhea or loose stools.   ondansetron  8 MG tablet Commonly known as: ZOFRAN  TAKE 0.5 TABLETS (4 MG TOTAL) BY MOUTH EVERY 8 (EIGHT) HOURS AS NEEDED FOR NAUSEA OR VOMITING.   phenazopyridine  100 MG tablet Commonly known as: PYRIDIUM  TAKE 1 TABLET BY MOUTH THREE TIMES A DAY AS NEEDED FOR PAIN   potassium chloride  SA 20 MEQ tablet Commonly known as: Klor-Con  M20 Take 1 tablet (20 mEq total) by mouth 3 (three) times daily.   rosuvastatin  20 MG tablet Commonly known as: CRESTOR  Take 20 mg by mouth at bedtime.   sertraline  100 MG tablet Commonly known as: Zoloft  Take 1 tablet (100 mg total) by mouth daily.   topiramate  100 MG tablet Commonly known as: TOPAMAX  TAKE 1 TABLET BY MOUTH TWICE A DAY   vancomycin  125 MG capsule Commonly known as: VANCOCIN  Take one capsule on Monday, Wednesday, and Friday for 8 weeks.        Allergies:  Allergies  Allergen Reactions   Codeine Nausea Only    Family History: Family History  Problem Relation Age of Onset   Osteoporosis Mother    Heart attack Father    Heart attack Brother 80       MI   Breast cancer Maternal Aunt    Breast cancer Paternal Aunt    Cancer Paternal Aunt        ovarian cancer   Anesthesia problems Neg Hx    Hypotension Neg Hx    Malignant hyperthermia Neg Hx    Pseudochol deficiency Neg Hx    Colon cancer Neg Hx    Inflammatory bowel disease Neg Hx     Social History:  reports that she quit smoking about 39 years ago. Her smoking use included cigarettes. She started smoking about 59 years ago. She has a 20 pack-year smoking history. She has never used smokeless tobacco. She reports that she does not drink alcohol and does not use drugs.  ROS: All other review of systems were reviewed and are negative except what is noted above in HPI  Physical Exam: BP 100/70   Pulse 75   Constitutional:  Alert and oriented, No acute distress. HEENT: [place] AT, moist mucus  membranes.  Trachea midline, no masses. Cardiovascular: No clubbing, cyanosis, or edema. Respiratory: Normal respiratory effort, no increased work of breathing. GI: Abdomen is soft, nontender, nondistended, no abdominal masses GU: No CVA tenderness.  Lymph: No cervical or inguinal lymphadenopathy. Skin: No rashes, bruises or suspicious lesions. Neurologic: Grossly intact, no focal deficits, moving all 4 extremities. Psychiatric: Normal mood and affect.  Laboratory Data: Lab Results  Component Value Date   WBC 6.1 01/13/2024   HGB 13.5 01/13/2024   HCT 42.8 01/13/2024   MCV 94.1 01/13/2024   PLT 212 01/13/2024    Lab Results  Component Value Date   CREATININE  0.78 01/13/2024    No results found for: "PSA"  No results found for: "TESTOSTERONE"  Lab Results  Component Value Date   HGBA1C 6.0 (H) 01/11/2023    Urinalysis    Component Value Date/Time   COLORURINE AMBER (A) 01/26/2024 1153   APPEARANCEUR CLOUDY (A) 01/26/2024 1153   APPEARANCEUR Cloudy (A) 12/22/2023 1236   LABSPEC 1.020 01/26/2024 1153   PHURINE 5.0 01/26/2024 1153   GLUCOSEU NEGATIVE 01/26/2024 1153   HGBUR LARGE (A) 01/26/2024 1153   BILIRUBINUR negative 03/18/2024 1105   BILIRUBINUR Negative 12/22/2023 1236   KETONESUR negative 03/18/2024 1105   KETONESUR NEGATIVE 01/26/2024 1153   PROTEINUR 100 (A) 01/26/2024 1153   UROBILINOGEN 0.2 03/18/2024 1105   UROBILINOGEN 0.2 01/12/2014 1045   NITRITE Negative 03/18/2024 1105   NITRITE NEGATIVE 01/26/2024 1153   LEUKOCYTESUR Negative 03/18/2024 1105   LEUKOCYTESUR MODERATE (A) 01/26/2024 1153    Lab Results  Component Value Date   LABMICR 36.0 03/18/2024   WBCUA >30 (A) 12/22/2023   LABEPIT 0-10 12/22/2023   BACTERIA RARE (A) 01/26/2024    Pertinent Imaging: Ct 01/13/24: Images reviewed and discussed with the patient  No results found for this or any previous visit.  No results found for this or any previous visit.  No results found for this  or any previous visit.  No results found for this or any previous visit.  No results found for this or any previous visit.  No results found for this or any previous visit.  No results found for this or any previous visit.  No results found for this or any previous visit.   Assessment & Plan:    1. Urinary frequency (Primary) -we will trial gemtesa 75mg  daily - Urinalysis, Routine w reflex microscopic - BLADDER SCAN AMB NON-IMAGING  2. dysuira Urine for cytology -next available office cystoscopy   No follow-ups on file.  Johnie Nailer, MD  [facility] [place]

## 2024-04-23 NOTE — Progress Notes (Signed)
post void residual=83 

## 2024-04-23 NOTE — Patient Instructions (Signed)

## 2024-04-26 LAB — CYTOLOGY, URINE

## 2024-04-27 ENCOUNTER — Other Ambulatory Visit: Payer: Self-pay | Admitting: Physician Assistant

## 2024-04-27 ENCOUNTER — Ambulatory Visit: Payer: Self-pay

## 2024-04-28 ENCOUNTER — Other Ambulatory Visit: Payer: Self-pay | Admitting: Nurse Practitioner

## 2024-04-28 ENCOUNTER — Other Ambulatory Visit: Payer: Self-pay | Admitting: Internal Medicine

## 2024-04-29 ENCOUNTER — Inpatient Hospital Stay: Payer: Medicare HMO | Attending: Internal Medicine | Admitting: Hematology

## 2024-04-29 ENCOUNTER — Telehealth: Payer: Self-pay | Admitting: Urology

## 2024-04-29 VITALS — BP 105/78 | HR 86 | Temp 97.1°F | Resp 18 | Wt 95.2 lb

## 2024-04-29 DIAGNOSIS — Z803 Family history of malignant neoplasm of breast: Secondary | ICD-10-CM | POA: Diagnosis not present

## 2024-04-29 DIAGNOSIS — Z85118 Personal history of other malignant neoplasm of bronchus and lung: Secondary | ICD-10-CM | POA: Insufficient documentation

## 2024-04-29 DIAGNOSIS — C3490 Malignant neoplasm of unspecified part of unspecified bronchus or lung: Secondary | ICD-10-CM | POA: Diagnosis not present

## 2024-04-29 DIAGNOSIS — Z8673 Personal history of transient ischemic attack (TIA), and cerebral infarction without residual deficits: Secondary | ICD-10-CM | POA: Insufficient documentation

## 2024-04-29 DIAGNOSIS — Z8041 Family history of malignant neoplasm of ovary: Secondary | ICD-10-CM | POA: Insufficient documentation

## 2024-04-29 DIAGNOSIS — Z87891 Personal history of nicotine dependence: Secondary | ICD-10-CM | POA: Insufficient documentation

## 2024-04-29 DIAGNOSIS — N3281 Overactive bladder: Secondary | ICD-10-CM

## 2024-04-29 DIAGNOSIS — R350 Frequency of micturition: Secondary | ICD-10-CM

## 2024-04-29 DIAGNOSIS — Z801 Family history of malignant neoplasm of trachea, bronchus and lung: Secondary | ICD-10-CM | POA: Diagnosis not present

## 2024-04-29 DIAGNOSIS — Z9221 Personal history of antineoplastic chemotherapy: Secondary | ICD-10-CM | POA: Diagnosis not present

## 2024-04-29 NOTE — Telephone Encounter (Signed)
 Returned call to patient and informed her a message would be sent to the provider and once he responds with an alternative someone will reach back out to her. Patient voiced understanding.

## 2024-04-29 NOTE — Patient Instructions (Addendum)
 [facility] at [facility] Discharge Instructions   You were seen and examined today by Dr. Cheree Cords.  He reviewed the results of your PET scan which is stable. No evidence of the cancer growing or spreading.    You may try Johny Nap or [employer] to [employer]. These are plant based and contain no dairy.   We will see you back in 4 months. We will repeat a CT scan and lab work prior to this visit.   Return as scheduled.    Thank you for choosing [facility] at [facility] to provide your oncology and hematology care.  To afford each patient quality time with our provider, please arrive at least 15 minutes before your scheduled appointment time.   If you have a lab appointment with the Cancer Center please come in thru the Main Entrance and check in at the main information desk.  You need to re-schedule your appointment should you arrive 10 or more minutes late.  We strive to give you quality time with our providers, and arriving late affects you and other patients whose appointments are after yours.  Also, if you no show three or more times for appointments you may be dismissed from the clinic at the providers discretion.     Again, thank you for choosing [facility]  Our hope is that these requests will decrease the amount of time that you wait before being seen by our physicians.       _____________________________________________________________  Should you have questions after your visit to [facility], please contact our office at [phone] and follow the prompts.  Our office hours are 8:00 a.m. and 4:30 p.m. Monday - Friday.  Please note that voicemails left after 4:00 p.m. may not be returned until the following business day.  We are closed weekends and major holidays.  You do have access to a nurse 24-7, just call the main number to the clinic [phone] and do not press any options, hold  on the line and a nurse will answer the phone.    For prescription refill requests, have your pharmacy contact our office and allow 72 hours.    Due to Covid, you will need to wear a mask upon entering the hospital. If you do not have a mask, a mask will be given to you at the Main Entrance upon arrival. For doctor visits, patients may have 1 support person age 14 or older with them. For treatment visits, patients can not have anyone with them due to social distancing guidelines and our immunocompromised population.

## 2024-04-29 NOTE — Telephone Encounter (Signed)
 Gemtesa  gave her diarrhea should she continue it or switch to something else?

## 2024-04-29 NOTE — Progress Notes (Signed)
 Patricia [name] [address] [place] [address]    Clinic Day:  04/29/2024  Referring physician: Bennet Brasil, MD  Patient Care Team: Patricia Brasil, MD as PCP - General (Family Medicine) Patricia Boros, MD as Medical Oncologist (Medical Oncology) Patricia Knuckles, RN as Oncology Nurse Navigator (Medical Oncology)   ASSESSMENT & PLAN:   Assessment: 1.  Metastatic adenocarcinoma of the lung PD-L1 99%, K-ras G12 C: - PET scan at diagnosis (12/06/2022): 2.3 cm left lung nodule, hypermetabolic mediastinal and left supraclavicular adenopathy and enlarged AP window lymph node impinging on the left recurrent laryngeal nerve.  Multiple hypermetabolic cutaneous nodules within the chest, abdomen and pelvis and extremities.  Right adrenal gland metastasis.  Peritoneal and retroperitoneal nodular metastasis.  Muscular metastasis primarily in the pelvis. - Left supraclavicular lymph node biopsy (12/16/2022): Metastatic adenocarcinoma.  Positive for CK7 and TTF-1. - Foundation 1: K-ras G 12C.  MS-stable.  TMB-low.  PD-L1 TPS-99%. - Cycle 1 of carboplatin , pembrolizumab  and pemetrexed  on 12/30/2022, abandoned as she was hospitalized with stroke. - Keytruda  from 02/18/2023 through 08/18/2023, discontinued after [name] admission with diarrhea and colitis.  Colonoscopy and biopsies showed lymphocytic colitis which improved with 8 weeks of Entocort. - Adagrasib  600 mg twice daily started on September 26, 2023, discontinued after hospitalization on 11/13/2023 through 11/15/2023 with C. difficile colitis - Not entirely clear if she had pembrolizumab  induced colitis as her biopsy showed lymphocytic colitis as opposed to neutrophilic colitis commonly seen in immunotherapy related colitis.   2. Social/Family History: -Lives at home with husband. She is somewhat independent of ADL's and dependent of IADL's. Quit tobacco use over 30 years ago.  She has left leg weakness from prior CVA and has  difficulty with balance.  Uses walker at home. -Maternal uncle had lung cancer. Paternal aunt had ovarian cancer.    Plan: 1.  Metastatic adenocarcinoma of the left lung, PD-L1 99%, K-ras G 12C: - PET scan (01/05/2024): 9 mm stable left lower lobe lung nodule with no hypermetabolic 1.  No evidence of recurrence or metastatic disease. - I have reviewed PET scan from 04/22/2024: No significant interval change in the subtle area of nodularity along the left lung base.  No new areas of abnormal uptake.  Other benign findings were discussed. - She is not on any active therapy at this time due to comorbidities/complications. - We are closely monitoring and will start her back either on immunotherapy or a different K-ras G12C TKI.  RTC 4 months with repeat CT CAP with contrast.  If the scan is stable at that time, may switch to every 6 months.      Orders Placed This Encounter  Procedures   CT CHEST ABDOMEN PELVIS W CONTRAST    Standing Status:   Future    Expected Date:   07/30/2024    Expiration Date:   04/29/2025    If indicated for the ordered procedure, I authorize the administration of contrast media per Radiology protocol:   Yes    Does the patient have a contrast media/X-ray dye allergy?:   No    Preferred imaging location?:   [facility]    If indicated for the ordered procedure, I authorize the administration of oral contrast media per Radiology protocol:   Yes   CBC with Differential    Standing Status:   Future    Expected Date:   08/30/2024    Expiration Date:   04/29/2025   Comprehensive metabolic  panel    Standing Status:   Future    Expected Date:   08/30/2024    Expiration Date:   04/29/2025   Magnesium     Standing Status:   Future    Expected Date:   08/30/2024    Expiration Date:   04/29/2025      Patricia [name],acting as a scribe for Patricia Boros, MD.,have documented all relevant documentation on the behalf of Patricia Boros, MD,as directed by   Patricia Boros, MD while in the presence of Patricia Boros, MD.  I, Patricia Boros MD, have reviewed the above documentation for accuracy and completeness, and I agree with the above.    Patricia Boros, MD   5/22/202512:48 PM  CHIEF COMPLAINT:   Diagnosis: Non-small cell lung cancer    Cancer Staging  Non-small cell lung cancer, left (HCC) Staging form: Lung, AJCC 8th Edition - Clinical: Stage IVB (cT1c, cN3, cM1c) - Signed by Patricia Simas, MD on 12/19/2022    Prior Therapy: 1. carboplatin , pembrolizumab  and pemetrexed , 1 cycle on 12/30/22 2. Keytruda , 02/18/23 - 08/18/23 3. Adagrasib , 09/26/23 - 11/13/23  Current Therapy: Observation   HISTORY OF PRESENT ILLNESS:   Oncology History  Non-small cell lung cancer, left (HCC)  12/19/2022 Initial Diagnosis   Adenocarcinoma of left lung, stage 4 (HCC)   12/19/2022 Cancer Staging   Staging form: Lung, AJCC 8th Edition - Clinical: Stage IVB (cT1c, cN3, cM1c) - Signed by Patricia Simas, MD on 12/19/2022   12/30/2022 - 08/18/2023 Chemotherapy   Patient is on Treatment Plan : LUNG Carboplatin  (5) + Pemetrexed  (500) + Pembrolizumab  (200) D1 q21d Induction x 4 cycles / Maintenance Pemetrexed  (500) + Pembrolizumab  (200) D1 q21d        INTERVAL HISTORY:   Patricia [name] is a 78 y.o. [name] presenting to clinic today for follow up of Non-small cell lung cancer. She was last seen by me on 01/26/24.  Since her last visit, she underwent restaging PET scan on 04/22/24 that found: Overall no significant interval change in the subtle area of nodularity along the left lung base. No associated abnormal uptake. No new areas of abnormal uptake to suggest recurrent or metastatic disease. Separate right-sided small lung nodule is stable as well. This ground-glass nodule has been present since at least December 2023. Small area of wall thickening along the distal sigmoid colon without abnormal uptake or dilatation. Likely an area of  peristalsis  Today, she states that she is doing well overall. Her appetite level is at 75%. Her energy level is at 25%.  PAST MEDICAL HISTORY:   Past Medical History: Past Medical History:  Diagnosis Date   Cancer (HCC)    Depression    GERD (gastroesophageal reflux disease)    Hypercholesterolemia    Hypokalemia 06/23/2023   IFG (impaired fasting glucose)    Nonintractable episodic headache 01/18/2023   Osteopenia    PONV (postoperative nausea and vomiting)    Stroke [name])     Surgical History: Past Surgical History:  Procedure Laterality Date   ABDOMINAL HYSTERECTOMY     BIOPSY  06/24/2023   Procedure: BIOPSY;  Surgeon: Urban Garden, MD;  [facility];  Service: Gastroenterology;;   CATARACT EXTRACTION W/PHACO  09/12/2011   Procedure: CATARACT EXTRACTION PHACO AND INTRAOCULAR LENS PLACEMENT (IOC);  Surgeon: Anner Kill;  Location: [facility];  Service: Ophthalmology;  Laterality: Left;  CDE: 8.91   CATARACT EXTRACTION W/PHACO  11/18/2011   Procedure: CATARACT EXTRACTION PHACO AND INTRAOCULAR LENS PLACEMENT (IOC);  Surgeon: Anner Kill;  Location: [facility];  Service: Ophthalmology;  Laterality: Right;  CDE:10.26   COLONOSCOPY  2007   Dr. Riley Cheadle: internal hemorrhoids, single anal papilla poor prep.    COLONOSCOPY N/A 03/27/2017   Rourk: Normal exam.   COLONOSCOPY  06/24/2023   Procedure: COLONOSCOPY;  Surgeon: Umberto Ganong, Bearl Limes, MD;  Location: [facility];  Service: Gastroenterology;;   ECTOPIC PREGNANCY SURGERY     removal of right tube   ESOPHAGOGASTRODUODENOSCOPY (EGD) WITH PROPOFOL  N/A 09/10/2021   Procedure: ESOPHAGOGASTRODUODENOSCOPY (EGD) WITH PROPOFOL ;  Surgeon: Suzette Espy, MD;  Location: [facility];  Service: Endoscopy;  Laterality: N/A;  8:15AM   MALONEY DILATION N/A 09/10/2021   Procedure: Londa Rival DILATION;  Surgeon: Suzette Espy, MD;  Location: [facility];  Service: Endoscopy;  Laterality: N/A;   OVARIAN CYST REMOVAL      right   RECTOCELE REPAIR N/A 01/18/2014   Procedure: POSTERIOR REPAIR (RECTOCELE);  Surgeon: Albino Hum, MD;  Location: [facility];  Service: Gynecology;  Laterality: N/A;   VAGINAL HYSTERECTOMY N/A 01/18/2014   Procedure: HYSTERECTOMY VAGINAL;  Surgeon: Albino Hum, MD;  Location: [facility];  Service: Gynecology;  Laterality: N/A;    Social History: Social History   Socioeconomic History   Marital status: Married    Spouse name: Not on file   Number of children: Not on file   Years of education: Not on file   Highest education level: Not on file  Occupational History   Not on file  Tobacco Use   Smoking status: Former    Current packs/day: 0.00    Average packs/day: 1 pack/day for 20.0 years (20.0 ttl pk-yrs)    Types: Cigarettes    Start date: 09/10/1964    Quit date: 09/10/1984    Years since quitting: 39.6   Smokeless tobacco: Never  Vaping Use   Vaping status: Never Used  Substance and Sexual Activity   Alcohol use: No   Drug use: No   Sexual activity: Not Currently    Birth control/protection: Post-menopausal  Other Topics Concern   Not on file  Social History Narrative   Not on file   Social Drivers of Health   Financial Resource Strain: Not on file  Food Insecurity: No Food Insecurity (11/14/2023)   Hunger Vital Sign    Worried About Running Out of Food in the Last Year: Never true    Ran Out of Food in the Last Year: Never true  Transportation Needs: No Transportation Needs (11/14/2023)   PRAPARE - Administrator, Civil Service (Medical): No    Lack of Transportation (Non-Medical): No  Physical Activity: Not on file  Stress: Not on file  Social Connections: Unknown (04/20/2022)   Received from [name]   Social Network    Social Network: Not on file  Intimate Partner Violence: Not At Risk (11/14/2023)   Humiliation, Afraid, Rape, and Kick questionnaire    Fear of Current or Ex-Partner: No    Emotionally Abused: No     Physically Abused: No    Sexually Abused: No    Family History: Family History  Problem Relation Age of Onset   Osteoporosis Mother    Heart attack Father    Heart attack Brother 3       MI   Breast cancer Maternal Aunt    Breast cancer Paternal Aunt    Cancer Paternal Aunt        ovarian cancer  Anesthesia problems Neg Hx    Hypotension Neg Hx    Malignant hyperthermia Neg Hx    Pseudochol deficiency Neg Hx    Colon cancer Neg Hx    Inflammatory bowel disease Neg Hx     Current Medications:  Current Outpatient Medications:    acetaminophen  (TYLENOL ) 500 MG tablet, Take 1,000 mg by mouth every 6 (six) hours as needed for mild pain (pain score 1-3)., Disp: , Rfl:    ALPRAZolam  (XANAX ) 0.5 MG tablet, TAKE 1 TABLET AT BEDTIME AS NEEDED FOR ANXIETY OR SLEEP, Disp: 30 tablet, Rfl: 2   apixaban  (ELIQUIS ) 5 MG TABS tablet, Take 1 tablet (5 mg total) by mouth 2 (two) times daily., Disp: 60 tablet, Rfl: 1   famotidine  (PEPCID ) 40 MG tablet, TAKE 1 TABLET BY MOUTH EVERY DAY, Disp: 90 tablet, Rfl: 1   HYDROcodone -acetaminophen  (NORCO/VICODIN) 5-325 MG tablet, Take 1 tablet by mouth every 8 (eight) hours as needed for moderate pain (pain score 4-6) or severe pain (pain score 7-10)., Disp: 15 tablet, Rfl: 0   lansoprazole  (PREVACID ) 30 MG capsule, TAKE ONE CAPSULE BY MOUTH DAILY FOR ACID REFLUX, Disp: 90 capsule, Rfl: 1   loperamide (IMODIUM) 2 MG capsule, Take 2 mg by mouth as needed for diarrhea or loose stools., Disp: , Rfl:    ondansetron  (ZOFRAN ) 8 MG tablet, TAKE 0.5 TABLETS (4 MG TOTAL) BY MOUTH EVERY 8 (EIGHT) HOURS AS NEEDED FOR NAUSEA OR VOMITING., Disp: 20 tablet, Rfl: 0   phenazopyridine  (PYRIDIUM ) 100 MG tablet, TAKE 1 TABLET BY MOUTH THREE TIMES A DAY AS NEEDED FOR PAIN, Disp: 10 tablet, Rfl: 0   potassium chloride  SA (KLOR-CON  M20) 20 MEQ tablet, Take 1 tablet (20 mEq total) by mouth 3 (three) times daily., Disp: 270 tablet, Rfl: 1   rosuvastatin  (CRESTOR ) 20 MG tablet, Take  20 mg by mouth at bedtime., Disp: , Rfl:    sertraline  (ZOLOFT ) 100 MG tablet, Take 1 tablet (100 mg total) by mouth daily., Disp: 90 tablet, Rfl: 0   topiramate  (TOPAMAX ) 100 MG tablet, TAKE 1 TABLET BY MOUTH TWICE A DAY, Disp: 180 tablet, Rfl: 0   vancomycin  (VANCOCIN ) 125 MG capsule, Take one capsule on Monday, Wednesday, and Friday for 8 weeks., Disp: 24 capsule, Rfl: 0   Vibegron  (GEMTESA ) 75 MG TABS, Take 1 tablet (75 mg total) by mouth daily. (Patient not taking: Reported on 04/29/2024), Disp: , Rfl:    Allergies: Allergies  Allergen Reactions   Codeine Nausea Only    REVIEW OF SYSTEMS:   Review of Systems  Constitutional:  Negative for chills, fatigue and fever.  HENT:   Negative for lump/mass, mouth sores, nosebleeds, sore throat and trouble swallowing.   Eyes:  Negative for eye problems.  Respiratory:  Negative for cough and shortness of breath.   Cardiovascular:  Negative for chest pain, leg swelling and palpitations.  Gastrointestinal:  Positive for abdominal pain, diarrhea and vomiting. Negative for constipation and nausea.  Genitourinary:  Negative for bladder incontinence, difficulty urinating, dysuria, frequency, hematuria and nocturia.   Musculoskeletal:  Negative for arthralgias, back pain, flank pain, myalgias and neck pain.  Skin:  Negative for itching and rash.  Neurological:  Negative for dizziness, headaches and numbness.  Hematological:  Does not bruise/bleed easily.  Psychiatric/Behavioral:  Positive for sleep disturbance. Negative for depression and suicidal ideas. The patient is not nervous/anxious.   All other systems reviewed and are negative.    VITALS:   Blood pressure 105/78, pulse 86, temperature (!)  97.1 F (36.2 C), temperature source Tympanic, resp. rate 18, weight 95 lb 3.8 oz (43.2 kg), SpO2 99%.  Wt Readings from Last 3 Encounters:  04/29/24 95 lb 3.8 oz (43.2 kg)  03/18/24 99 lb (44.9 kg)  02/19/24 97 lb 3.2 oz (44.1 kg)    Body mass index  is 21.35 kg/m.  Performance status (ECOG): 1 - Symptomatic but completely ambulatory  PHYSICAL EXAM:   Physical Exam Vitals and nursing note reviewed. Exam conducted with a chaperone present.  Constitutional:      Appearance: Normal appearance.  Cardiovascular:     Rate and Rhythm: Normal rate and regular rhythm.     Pulses: Normal pulses.     Heart sounds: Normal heart sounds.  Pulmonary:     Effort: Pulmonary effort is normal.     Breath sounds: Normal breath sounds.  Abdominal:     Palpations: Abdomen is soft. There is no hepatomegaly, splenomegaly or mass.     Tenderness: There is no abdominal tenderness.  Musculoskeletal:     Right lower leg: No edema.     Left lower leg: No edema.  Lymphadenopathy:     Cervical: No cervical adenopathy.     Right cervical: No superficial, deep or posterior cervical adenopathy.    Left cervical: No superficial, deep or posterior cervical adenopathy.     Upper Body:     Right upper body: No supraclavicular or axillary adenopathy.     Left upper body: No supraclavicular or axillary adenopathy.  Neurological:     General: No focal deficit present.     Mental Status: She is alert and oriented to person, place, and time.  Psychiatric:        Mood and Affect: Mood normal.        Behavior: Behavior normal.     LABS:   CBC     Component Value Date/Time   WBC 6.1 01/13/2024 1502   RBC 4.55 01/13/2024 1502   HGB 13.5 01/13/2024 1502   HGB 15.0 11/21/2023 1035   HCT 42.8 01/13/2024 1502   HCT 48.1 (H) 11/21/2023 1035   PLT 212 01/13/2024 1502   PLT 387 11/21/2023 1035   MCV 94.1 01/13/2024 1502   MCV 92 11/21/2023 1035   MCH 29.7 01/13/2024 1502   MCHC 31.5 01/13/2024 1502   RDW 13.2 01/13/2024 1502   RDW 14.6 11/21/2023 1035   LYMPHSABS 0.8 01/13/2024 1502   LYMPHSABS 2.7 11/21/2023 1035   MONOABS 0.6 01/13/2024 1502   EOSABS 0.0 01/13/2024 1502   EOSABS 0.1 11/21/2023 1035   BASOSABS 0.1 01/13/2024 1502   BASOSABS 0.2  11/21/2023 1035    CMP      Component Value Date/Time   NA 140 01/13/2024 1502   NA 144 11/21/2023 1035   K 4.1 01/13/2024 1502   CL 112 (H) 01/13/2024 1502   CO2 19 (L) 01/13/2024 1502   GLUCOSE 104 (H) 01/13/2024 1502   BUN 18 01/13/2024 1502   BUN 16 11/21/2023 1035   CREATININE 0.78 01/13/2024 1502   CREATININE 1.20 (H) 09/29/2023 0836   CALCIUM  9.1 01/13/2024 1502   PROT 6.0 (L) 11/13/2023 1152   PROT 7.0 08/02/2022 1112   ALBUMIN 3.3 (L) 11/13/2023 1152   ALBUMIN 4.8 08/02/2022 1112   AST 19 11/13/2023 1152   AST 13 (L) 09/29/2023 0836   ALT 24 11/13/2023 1152   ALT 13 09/29/2023 0836   ALKPHOS 107 11/13/2023 1152   BILITOT 0.9 11/13/2023 1152   BILITOT  0.3 09/29/2023 0836   GFRNONAA >60 01/13/2024 1502   GFRNONAA 47 (L) 09/29/2023 0836   GFRAA >90 01/19/2014 0530     No results found for: "CEA1", "CEA" / No results found for: "CEA1", "CEA" No results found for: "PSA1" No results found for: "ZOX096" No results found for: "CAN125"  No results found for: "TOTALPROTELP", "ALBUMINELP", "A1GS", "A2GS", "BETS", "BETA2SER", "GAMS", "MSPIKE", "SPEI" No results found for: "TIBC", "FERRITIN", "IRONPCTSAT" No results found for: "LDH"   STUDIES:   NM PET Image Restag (PS) Skull Base To Thigh Result Date: 04/28/2024 CLINICAL DATA:  Subsequent treatment strategy for non-small-cell lung cancer. EXAM: NUCLEAR MEDICINE PET SKULL BASE TO THIGH TECHNIQUE: 4.47 mCi F-18 FDG was injected intravenously. Full-ring PET imaging was performed from the skull base to thigh after the radiotracer. CT data was obtained and used for attenuation correction and anatomic localization. Fasting blood glucose: 109 mg/dl COMPARISON:  PET-CT 04/54/0981. Abdomen pelvis CT with contrast and chest x-ray from 01/13/2024. FINDINGS: Mediastinal blood pool activity: SUV max 2.4 Liver activity: SUV max 2.3 NECK: No specific abnormal uptake identified along lymph node chains including submandibular, posterior  triangle or internal jugular region in the neck. There is some mild asymmetric uptake along the right muscles of mastication, likely physiologic. Mild asymmetric uptake along the right vocal cord as well. Near symmetric uptake of the visualized intracranial compartment. Incidental CT findings: The parotid glands, submandibular glands unremarkable. Small thyroid  gland. Streak artifact related to the patient's dental hardware. Paranasal sinuses and mastoid air cells included imaging field are grossly clear. CHEST: No specific abnormal radiotracer uptake seen above blood pool in the axillary region, hilum or mediastinum. On the prior study was a nodular bandlike focus of opacity in the left lower lobe which previously has a diameter of 8 mm and today when measured in a similar fashion measures 7 mm on image 47 of series 202. In this has some minimal uptake previously with maximum SUV of 1.4 and today 1.0. No additional areas of abnormal uptake along the lung parenchyma. Incidental CT findings: Breathing motion. Dependent atelectasis or scar. Subtle vague nodular area posterior right upper lobe on lung window image 39 stable at 6 mm. Again no abnormal uptake. No pneumothorax or effusion. No large hiatal hernia. Patulous esophagus. Scattered vascular calcifications including thoracic aorta and coronary arteries. Heart is nonenlarged. No pericardial effusion. ABDOMEN/PELVIS: There is physiologic distribution radiotracer along the parenchymal organs and renal collecting systems. Diffuse uptake along the bowel is more than previous but again likely physiologic variant. Incidental CT findings: Distended gallbladder. Nonobstructing upper pole left-sided renal stone is new from previous measuring 6 mm. No ureteral stones. Preserved contour to the urinary bladder. Large bowel has a normal course and caliber overall. Scattered luminal stool in fluid. There is a small area of wall thickening along the mid sigmoid colon on axial  image 107. This was not seen previously and could relate 2 peristalsis. Please correlate with any symptoms. Diffuse vascular calcifications. SKELETON: No abnormal uptake along the visualized osseous structures. Incidental CT findings: Scattered degenerative changes along the spine and pelvis. Degenerative changes along the shoulders as well. IMPRESSION: Overall no significant interval change in the subtle area of nodularity along the left lung base. No associated abnormal uptake. No new areas of abnormal uptake to suggest recurrent or metastatic disease. Separate right-sided small lung nodule is stable as well. This ground-glass nodule has been present since at least December 2023. Small area of wall thickening along the distal sigmoid colon  without abnormal uptake or dilatation. Likely an area of peristalsis. Please correlate with any symptoms Electronically Signed   By: Adrianna Horde M.D.   On: 04/28/2024 17:00   MM 3D SCREENING MAMMOGRAM BILATERAL BREAST Result Date: 04/15/2024 CLINICAL DATA:  Screening. EXAM: DIGITAL SCREENING BILATERAL MAMMOGRAM WITH TOMOSYNTHESIS AND CAD TECHNIQUE: Bilateral screening digital craniocaudal and mediolateral oblique mammograms were obtained. Bilateral screening digital breast tomosynthesis was performed. The images were evaluated with computer-aided detection. COMPARISON:  Previous exam(s). ACR Breast Density Category c: The breasts are heterogeneously dense, which may obscure small masses. FINDINGS: There are no findings suspicious for malignancy. IMPRESSION: No mammographic evidence of malignancy. A result letter of this screening mammogram will be mailed directly to the patient. RECOMMENDATION: Screening mammogram in one year. (Code:[id number]-B-01Y) BI-RADS CATEGORY  1: Negative. Electronically Signed   By: Sande Cromer M.D.   On: 04/15/2024 14:58   DG Bone Density Result Date: 04/14/2024 EXAM: DUAL X-RAY ABSORPTIOMETRY (DXA) FOR BONE MINERAL DENSITY 04/14/2024 9:58 am CLINICAL  DATA:  78 year old [name] Postmenopausal. Screening for osteoporosis History of fragility fracture. TECHNIQUE: An axial (e.g., hips, spine) and/or appendicular (e.g., radius) exam was performed, as appropriate, using [employer], sport and exercise at [facility]. Images are obtained for bone mineral density measurement and are not obtained for diagnostic purposes. [id number] Exclusions: Lumbar spine due to advanced degenerative changes COMPARISON:  07/24/2020 FINDINGS: Scan quality: Good. LEFT FEMORAL NECK: BMD (in g/cm2): 0.748 T-score: -2.1 Z-score: -0.1 Rate of change from previous exam: -8.3% LEFT TOTAL HIP: BMD (in g/cm2): 0.800 T-score: -1.6 Z-score: 0.2 RIGHT FEMORAL NECK: BMD (in g/cm2): 0.747 T-score: -2.1 Z-score: -0.1 Rate of change from previous exam: -10.6% RIGHT TOTAL HIP: BMD (in g/cm2): 0.754 T-score: -2.0 Z-score: -0.2 DUAL-FEMUR TOTAL MEAN: Rate of change from previous exam: -13.7% LEFT FOREARM (RADIUS 33%): BMD (in g/cm2): 0.578 T-score: -1.8 Z-score: 0.6 Rate of change from previous exam: -10.6% FRAX 10-YEAR PROBABILITY OF FRACTURE: 10-year fracture risk is performed using the [employer] [name] FRAX calculator based on patient-reported risk factors. Major osteoporotic fracture: 20.0% Hip fracture: 5.5% Other situations known to alter the reliability of the FRAX score should be considered when making treatment decisions, including chronic glucocorticoid use and past treatments. Further guidance on treatment can be found at the [facility]opor[facility] Foundation's website [email or web address]/. IMPRESSION: Osteopenia based on BMD. Fracture risk is increased. Increased risk is based on low BMD, FRAX calculation and history of fragility fracture. RECOMMENDATIONS: 1. All patients should optimize calcium  and vitamin D intake. 2. Consider FDA-approved medical therapies in postmenopausal women and men aged 22 years and older, based on the following: - A hip or vertebral (clinical or morphometric)  fracture - T-score less than or equal to -2.5 and secondary causes have been excluded. - Low bone mass (T-score between -1.0 and -2.5) and a 10-year probability of a hip fracture greater than or equal to 3% or a 10-year probability of a major osteoporosis-related fracture greater than or equal to 20% based on the [place] -adapted WHO algorithm. - Clinician judgment and/or patient preferences may indicate treatment for people with 10-year fracture probabilities above or below these levels 3. Patients with diagnosis of osteoporosis or at high risk for fracture should have regular bone mineral density tests. For patients eligible for Medicare, routine testing is allowed once every 2 years. The testing frequency can be increased to one year for patients who have rapidly progressing disease, those who are receiving or discontinuing medical therapy to restore bone mass, or have  additional risk factors. Electronically Signed   By: Rinda Cheers M.D.   On: 04/14/2024 11:10

## 2024-04-30 MED ORDER — MIRABEGRON ER 25 MG PO TB24: 25.0000 mg | ORAL_TABLET | Freq: Every day | ORAL | 3 refills | Status: DC

## 2024-04-30 NOTE — Telephone Encounter (Signed)
 Patient called back wanting a call back today

## 2024-04-30 NOTE — Telephone Encounter (Signed)
 Tried calling patient with no answer, left detailed message making patient aware of medication changed. Rx Myrbetriq 25 mg sent to pharmacy per Dr. Claretta Croft  and Gemtesa  D/C.

## 2024-05-04 ENCOUNTER — Telehealth: Payer: Self-pay

## 2024-05-04 DIAGNOSIS — A09 Infectious gastroenteritis and colitis, unspecified: Secondary | ICD-10-CM

## 2024-05-04 NOTE — Telephone Encounter (Signed)
 Gemtesa  can cause diarrhea. It may take a little longer for diarrhea to resolve if from Gemtesa  but given her history of Cdiff I would recommend   Cdiff GDH at [facility]. Order placed

## 2024-05-04 NOTE — Telephone Encounter (Signed)
Pt's husband was made aware and verbalized understanding.  ?

## 2024-05-04 NOTE — Addendum Note (Signed)
 Addended by: Lanney Pitts on: 05/04/2024 04:46 PM   Modules accepted: Orders

## 2024-05-04 NOTE — Telephone Encounter (Signed)
 Pt's husband called stating that she has had diarrhea for the past 10 days. Stated that it started when she started taking Gemtesa . Was on the Gemtesa  for 4 to 5 days and has since stopped taking it but still has diarrhea.

## 2024-05-13 ENCOUNTER — Other Ambulatory Visit: Payer: Self-pay | Admitting: Nurse Practitioner

## 2024-05-13 ENCOUNTER — Telehealth: Payer: Self-pay | Admitting: *Deleted

## 2024-05-13 NOTE — Telephone Encounter (Unsigned)
 Copied from CRM [id number]. Topic: Clinical - Medication Question >> May 13, 2024 12:06 PM Lotus Round B wrote: Reason for CRM: pt husband calling in to see if its possible to get a new RX of the ondansetron  (ZOFRAN ) 8 MG tablet medication sent to the :   [id number] - [place], [place] - [address] AT [facility] [address] [place] [address] Phone: [phone] Fax: [phone] Hours: Not open 24 hours  If any questions please call husband [phone]

## 2024-05-14 ENCOUNTER — Other Ambulatory Visit: Payer: Self-pay | Admitting: Hematology

## 2024-05-14 MED ORDER — PROCHLORPERAZINE MALEATE 10 MG PO TABS: 10.0000 mg | ORAL_TABLET | Freq: Four times a day (QID) | ORAL | 0 refills | Status: DC | PRN

## 2024-05-15 ENCOUNTER — Other Ambulatory Visit: Payer: Self-pay | Admitting: Nurse Practitioner

## 2024-05-17 ENCOUNTER — Other Ambulatory Visit: Payer: Self-pay

## 2024-05-17 DIAGNOSIS — A0472 Enterocolitis due to Clostridium difficile, not specified as recurrent: Secondary | ICD-10-CM

## 2024-05-19 LAB — C DIFFICILE TOXINS A+B W/RFLX: C difficile Toxins A+B, EIA: POSITIVE — AB

## 2024-05-21 ENCOUNTER — Ambulatory Visit: Payer: Self-pay | Admitting: Gastroenterology

## 2024-05-21 ENCOUNTER — Other Ambulatory Visit (HOSPITAL_COMMUNITY): Payer: Self-pay

## 2024-05-21 MED ORDER — VANCOMYCIN HCL 125 MG PO CAPS: ORAL_CAPSULE | ORAL | 0 refills | Status: DC

## 2024-05-21 MED ORDER — DIFICID 200 MG PO TABS
200.0000 mg | ORAL_TABLET | Freq: Two times a day (BID) | ORAL | 0 refills | Status: DC
  Filled 2024-05-21: qty 20, 10d supply, fill #0

## 2024-05-24 ENCOUNTER — Telehealth: Payer: Self-pay

## 2024-05-24 MED ORDER — VANCOMYCIN HCL 125 MG PO CAPS: ORAL_CAPSULE | ORAL | 0 refills | Status: DC

## 2024-05-24 NOTE — Addendum Note (Signed)
 Addended by: Lanney Pitts on: 05/24/2024 05:09 PM   Modules accepted: Orders

## 2024-05-24 NOTE — Telephone Encounter (Signed)
 Pt's husband called stating that he will need 5 more vancomycin  called in to be able to do the taper that was set up for the pt. Pt was sent in 100 but will require 105 to complete taper as prescribed.

## 2024-05-24 NOTE — Telephone Encounter (Signed)
 The last part of the taper the every 2 day for 8 weeks can range from every 2-3 days for 2-8 weeks. It is ok to finish out the 100 pills. But if he still wants additional five I can do that.

## 2024-05-24 NOTE — Telephone Encounter (Signed)
 Husband was made aware and states that he would feel better with the 5 being called in.

## 2024-05-24 NOTE — Telephone Encounter (Signed)
 done

## 2024-06-02 ENCOUNTER — Other Ambulatory Visit: Admitting: Urology

## 2024-06-09 ENCOUNTER — Telehealth: Payer: Self-pay

## 2024-06-09 ENCOUNTER — Encounter: Payer: Self-pay | Admitting: Gastroenterology

## 2024-06-09 ENCOUNTER — Ambulatory Visit: Admitting: Gastroenterology

## 2024-06-09 VITALS — BP 115/76 | HR 75 | Temp 98.7°F | Ht <= 58 in | Wt 96.4 lb

## 2024-06-09 DIAGNOSIS — A0472 Enterocolitis due to Clostridium difficile, not specified as recurrent: Secondary | ICD-10-CM

## 2024-06-09 DIAGNOSIS — A0471 Enterocolitis due to Clostridium difficile, recurrent: Secondary | ICD-10-CM | POA: Insufficient documentation

## 2024-06-09 DIAGNOSIS — K5900 Constipation, unspecified: Secondary | ICD-10-CM | POA: Diagnosis not present

## 2024-06-09 NOTE — Telephone Encounter (Signed)
 Vowst form in box to be completed and signed.

## 2024-06-09 NOTE — Progress Notes (Signed)
 GI Office Note    Referring Provider: Alphonsa Glendia LABOR, MD Primary Care Physician:  Alphonsa Glendia LABOR, MD  Primary Gastroenterologist: Ozell Hollingshead, MD   Chief Complaint   Chief Complaint  Patient presents with   Follow-up    Here to discuss Vowst. Currently on two a day vancomycin . States that she is doing a whole lot better.     History of Present Illness   Patricia [name] is a 78 y.o. female presenting today for follow up of recurrent C.diff. Last seen in 01/2024. H/o GERD, lymphocytic colitis (dx 06/2023), h/o Cdiff 11/2023.   Pertinent history: Hospitalized back in July with colitis/diarrhea.  CT with findings consistent with colitis from splenic flexure to the level of the rectum.  Underwent a colonoscopy which showed friability with contact bleeding in the ascending colon and in the cecum, pathology showed lymphocytic colitis. Per oncology, not entirely clear if pembrolizumab  induced colitis as her colonoscopy and biopsy showed lymphocytic colitis. Immunotherapy induced colitis usually has neutrophilic colitis. C. difficile testing negative. She was given budesonide  9 mg daily for 8 weeks.  Diarrhea resolved.     CT with contrast in 09/2023 with normal colon.   Admission in November with failure to thrive, nausea/vomiting/diarrhea in the setting of new chemotherapy drug adagrasib . Found to have transaminitis/acute hepatitis likely secondary to adagrasib .    Readmitted to the hospital in 11/2023, CT abdomen pelvis revealed pancolitis.  C. difficile antigen positive, toxin negative but PCR was positive.  Initially was started on Dificid  but the cost was too high and she was switched over to oral vancomycin .  Completing a 10-day course.    Called with recurrent diarrhea 12/01/23, started on vanc taper.  It was not clear that taper was done correctly by patient. In 04/2024, husband called reporting 10 days of diarrhea on Gemtesa  (started 4-5 days after starting new med but did  not resolve with stopping it). Due to ongoing diarrhea, she completed Cdiff testing 05/17/24, we ordered Cdiff G[facility] at [facility] but patient had testing done through [facility] and it was converted to Cdiff Toxin A+B by EIA, was positive.   Tried to get Dificid  but was not affordable at over $700 for patient's part after insurance. We started her on vanc taper.   Today: Currently down to two vanc daily.  Feeling better. Appetite better. Less abdominal discomfort. BM once per day. Stools somewhat hard. Finally got rid of her chronic UTI from last 07/2023. No melena, brbpr. Has gained six pounds, got down to 90 pounds.   Recent PET 04/22/24, per Dr. Rogers, no significant interval change in subtle area of nodularity along left lung base. No new areas of abnormal uptake. Other benign findings discussed with patient. There was a small area of wall thickening along mid sigmoid colon, not previously seen and without abnormal uptake, likely an area of peristalsis. Colonoscopy up to date as outlined.       Planned Vancomycin  taper 125 mg orally 4 times daily for 14 days, then 125 mg orally twice daily for 7 days, then 125 mg orally once daily for 7 days, then 125 mg orally every 2 days for 8 weeks    Colonoscopy July 2024: -Friability with contact bleeding in the ascending colon and in the cecum pathology consistent with lymphocytic colitis -Distal rectum and anal verge normal   EGD October 2022: -Normal esophagus -Medium sized hiatal hernia -Normal stomach -Normal duodenal bulb and second portion of duodenum  Medications  Current Outpatient Medications  Medication Sig Dispense Refill   acetaminophen  (TYLENOL ) 500 MG tablet Take 1,000 mg by mouth every 6 (six) hours as needed for mild pain (pain score 1-3).     ALPRAZolam  (XANAX ) 0.5 MG tablet TAKE 1 TABLET AT BEDTIME AS NEEDED FOR ANXIETY OR SLEEP 30 tablet 2   apixaban  (ELIQUIS ) 5 MG TABS tablet Take 1 tablet (5 mg total) by mouth 2 (two) times  daily. 60 tablet 1   famotidine  (PEPCID ) 40 MG tablet TAKE 1 TABLET BY MOUTH EVERY DAY 90 tablet 1   lansoprazole  (PREVACID ) 30 MG capsule TAKE ONE CAPSULE BY MOUTH DAILY FOR ACID REFLUX 90 capsule 1   loperamide (IMODIUM) 2 MG capsule Take 2 mg by mouth as needed for diarrhea or loose stools.     ondansetron  (ZOFRAN ) 8 MG tablet TAKE 0.5 TABLETS (4 MG TOTAL) BY MOUTH EVERY 8 (EIGHT) HOURS AS NEEDED FOR NAUSEA OR VOMITING. 20 tablet 0   potassium chloride  SA (KLOR-CON  M20) 20 MEQ tablet Take 1 tablet (20 mEq total) by mouth 3 (three) times daily. 270 tablet 1   prochlorperazine  (COMPAZINE ) 10 MG tablet Take 1 tablet (10 mg total) by mouth every 6 (six) hours as needed for nausea or vomiting. 30 tablet 0   rosuvastatin  (CRESTOR ) 20 MG tablet Take 20 mg by mouth at bedtime.     sertraline  (ZOLOFT ) 100 MG tablet Take 1 tablet (100 mg total) by mouth daily. 90 tablet 0   topiramate  (TOPAMAX ) 100 MG tablet TAKE 1 TABLET BY MOUTH TWICE A DAY 180 tablet 0   vancomycin  (VANCOCIN ) 125 MG capsule 125 mg orally 4 times daily for 14 days, then 125 mg orally twice daily for 7 days, then 125 mg orally once daily for 7 days, then 125 mg orally every 2 days for 8 weeks 100 capsule 0   No current facility-administered medications for this visit.    Allergies   Allergies as of 06/09/2024 - Review Complete 06/09/2024  Allergen Reaction Noted   Codeine Nausea Only 09/11/2011       Review of Systems   General: Negative for anorexia, weight loss, fever, chills, fatigue, weakness.see hpi ENT: Negative for hoarseness, difficulty swallowing , nasal congestion. CV: Negative for chest pain, angina, palpitations, dyspnea on exertion, peripheral edema.  Respiratory: Negative for dyspnea at rest, dyspnea on exertion, cough, sputum, wheezing.  GI: See history of present illness. GU:  Negative for dysuria, hematuria, urinary incontinence, urinary frequency, nocturnal urination.  Endo: Negative for unusual weight  change.     Physical Exam   BP 115/76 (BP Location: Right Arm, Patient Position: Sitting, Cuff Size: Normal)   Pulse 75   Temp 98.7 F (37.1 C) (Oral)   Ht 4' 8 (1.422 m)   Wt 96 lb 6.4 oz (43.7 kg)   SpO2 97%   BMI 21.61 kg/m    General: thin, elderly, female in NAD. Accompanied by spouse.  Eyes: No icterus. Mouth: Oropharyngeal mucosa moist and pink   Lungs: Clear to auscultation bilaterally.  Heart: Regular rate and rhythm, no murmurs rubs or gallops.  Abdomen: Bowel sounds are normal, nontender, nondistended, no hepatosplenomegaly or masses,  no abdominal bruits or hernia , no rebound or guarding.  Rectal: not performed Extremities: No lower extremity edema. No clubbing or deformities. Neuro: Alert and oriented x 4   Skin: Warm and dry, no jaundice.   Psych: Alert and cooperative, normal mood and affect.  Labs   Lab Results  Component Value Date  NA 140 01/13/2024   CL 112 (H) 01/13/2024   K 4.1 01/13/2024   CO2 19 (L) 01/13/2024   BUN 18 01/13/2024   CREATININE 0.78 01/13/2024   GFRNONAA >60 01/13/2024   CALCIUM  9.1 01/13/2024   PHOS 2.5 06/23/2023   ALBUMIN 3.3 (L) 11/13/2023   GLUCOSE 104 (H) 01/13/2024   Lab Results  Component Value Date   ALT 24 11/13/2023   AST 19 11/13/2023   ALKPHOS 107 11/13/2023   BILITOT 0.9 11/13/2023   Lab Results  Component Value Date   WBC 6.1 01/13/2024   HGB 13.5 01/13/2024   HCT 42.8 01/13/2024   MCV 94.1 01/13/2024   PLT 212 01/13/2024    Imaging Studies   No results found.  Assessment/Plan:   Recurrent C diff: -Dificid  cost prohibitive -treated with Vancomycin  in 11/2023 with 10 day course -treated with Vancomycin  with taper, shorted after initial 10 day course completed -currently on Vancomycin  taper, currently on BID dosing, goes to daily dosing on July 6th and then has 8 weeks of vancomycin  every 2 days. Clinically doing much better at this time -recommend Vowst given recurrent C.diff and she is high  risk for another C.diff recurrence with potential immunotherapy again in the future for her metastatic lung cancer. -we will start approval process for Vowst. Discussed Vowst at length with patient and potential side effects including small chance of recurrent C.diff even with use of Vowst. She desires treatment.     Mild constipation: -may use docusate sodium  100-200mg  at bedtime. Avoid stimulants or laxatives.    Sonny RAMAN. Ezzard, MHS, PA-C [facility]

## 2024-06-09 NOTE — Patient Instructions (Addendum)
 Continue vancomycin  taper. Let me know if you are not able to get the last 5 pills.   We will start approval process for Vowst. Vowst will need to be started between 1-3 days after last dose of vancomycin .   Please call if you have any questions or concerns.   Please call if you have any recurrent diarrhea.  For mild constipation, you can take colace (docusate sodium ) 100-200mg  at bedtime. PLEASE DO NOT TAKE ANYTHING WITH A STIMULANT OR LAXATIVE.

## 2024-06-11 NOTE — Telephone Encounter (Signed)
Completed and given to Tammy C.

## 2024-06-13 ENCOUNTER — Other Ambulatory Visit: Payer: Self-pay | Admitting: Nurse Practitioner

## 2024-06-14 NOTE — Telephone Encounter (Signed)
 Form has been faxed to the company for review.

## 2024-06-22 DIAGNOSIS — H532 Diplopia: Secondary | ICD-10-CM | POA: Diagnosis not present

## 2024-06-22 DIAGNOSIS — Z961 Presence of intraocular lens: Secondary | ICD-10-CM | POA: Diagnosis not present

## 2024-06-22 DIAGNOSIS — H5211 Myopia, right eye: Secondary | ICD-10-CM | POA: Diagnosis not present

## 2024-06-24 ENCOUNTER — Ambulatory Visit: Admitting: Nurse Practitioner

## 2024-06-24 VITALS — BP 122/82 | HR 65 | Temp 97.3°F | Ht <= 58 in | Wt 98.2 lb

## 2024-06-24 DIAGNOSIS — F419 Anxiety disorder, unspecified: Secondary | ICD-10-CM | POA: Diagnosis not present

## 2024-06-24 DIAGNOSIS — I10 Essential (primary) hypertension: Secondary | ICD-10-CM

## 2024-06-24 DIAGNOSIS — F32A Depression, unspecified: Secondary | ICD-10-CM | POA: Diagnosis not present

## 2024-06-24 DIAGNOSIS — M8580 Other specified disorders of bone density and structure, unspecified site: Secondary | ICD-10-CM | POA: Diagnosis not present

## 2024-06-24 DIAGNOSIS — Z9189 Other specified personal risk factors, not elsewhere classified: Secondary | ICD-10-CM

## 2024-06-24 MED ORDER — ALENDRONATE SODIUM 70 MG PO TABS
70.0000 mg | ORAL_TABLET | ORAL | 3 refills | Status: AC
Start: 2024-06-24 — End: ?

## 2024-06-25 ENCOUNTER — Encounter: Payer: Self-pay | Admitting: Nurse Practitioner

## 2024-06-25 NOTE — Progress Notes (Signed)
 Subjective:    Patient ID: Patricia [name], female    DOB: Aug 31, 1946, 78 y.o.   MRN: [id number]  HPI Presents for routine follow-up of her chronic health issues.  Doing well on sertraline  for anxiety and depression.  Takes low-dose Xanax  at bedtime as needed for sleep.  Her UTI symptoms have resolved after antibiotic therapy.  Urology but then she developed C. difficile which has improved.  Also regular follow-up with GI specialist.  Continues follow-up with oncology.  Patient has been off of her Fosamax  for a long time.  States she took the medication without difficulty.   Review of Systems  Respiratory:  Negative for cough, chest tightness, shortness of breath and wheezing.   Cardiovascular:  Negative for chest pain and leg swelling.      03/18/2024   10:46 AM  Depression screen PHQ 2/9  Decreased Interest 0  Down, Depressed, Hopeless 2  PHQ - 2 Score 2  Altered sleeping 0  Tired, decreased energy 0  Change in appetite 0  Feeling bad or failure about yourself  0  Trouble concentrating 0  Moving slowly or fidgety/restless 0  Suicidal thoughts 2  PHQ-9 Score 4  Difficult doing work/chores Somewhat difficult      02/19/2024    8:57 AM 01/08/2024    3:42 PM 12/25/2023    2:34 PM 12/15/2023    3:49 PM  GAD 7 : Generalized Anxiety Score  Nervous, Anxious, on Edge 0 0 3 2  Control/stop worrying 1 0 3 2  Worry too much - different things 1 0 3 2  Trouble relaxing 2 0 3 2  Restless 1 0 0 0  Easily annoyed or irritable 1 0 2 2  Afraid - awful might happen 0 0 0 0  Total GAD 7 Score 6 0 14 10  Anxiety Difficulty Very difficult Not difficult at all Somewhat difficult Somewhat difficult    Social History   Tobacco Use   Smoking status: Former    Current packs/day: 0.00    Average packs/day: 1 pack/day for 20.0 years (20.0 ttl pk-yrs)    Types: Cigarettes    Start date: 09/10/1964    Quit date: 09/10/1984    Years since quitting: 39.8   Smokeless tobacco: Never  Vaping Use    Vaping status: Never Used  Substance Use Topics   Alcohol use: No   Drug use: No        Objective:   Physical Exam Vitals reviewed.  Constitutional:      General: She is not in acute distress. Cardiovascular:     Rate and Rhythm: Normal rate and regular rhythm.     Heart sounds: Normal heart sounds.  Pulmonary:     Effort: Pulmonary effort is normal.     Breath sounds: Normal breath sounds.  Neurological:     Mental Status: She is alert.     Gait: Gait normal.     Comments: Gait slow but steady.  Psychiatric:        Mood and Affect: Mood normal.        Behavior: Behavior normal.        Thought Content: Thought content normal.        Judgment: Judgment normal.     Comments: Calm cheerful affect.  Speech clear.  Making good eye contact.    Today's Vitals   06/24/24 1042  BP: 122/82  Pulse: 65  Temp: (!) 97.3 F (36.3 C)  SpO2: 98%  Weight: 98  lb 3.2 oz (44.5 kg)  Height: 4' 8 (1.422 m)   Body mass index is 22.02 kg/m.  EXAM: DUAL X-RAY ABSORPTIOMETRY (DXA) FOR BONE MINERAL DENSITY 04/14/2024 9:58 am   CLINICAL DATA:  78 year old Female Postmenopausal. Screening for osteoporosis   History of fragility fracture.   TECHNIQUE: An axial (e.g., hips, spine) and/or appendicular (e.g., radius) exam was performed, as appropriate, using [employer], sport and exercise at [facility]. Images are obtained for bone mineral density measurement and are not obtained for diagnostic purposes. [id number]   Exclusions: Lumbar spine due to advanced degenerative changes   COMPARISON:  07/24/2020   FINDINGS: Scan quality: Good.   LEFT FEMORAL NECK:   BMD (in g/cm2): 0.748   T-score: -2.1   Z-score: -0.1   Rate of change from previous exam: -8.3%   LEFT TOTAL HIP:   BMD (in g/cm2): 0.800   T-score: -1.6   Z-score: 0.2   RIGHT FEMORAL NECK:   BMD (in g/cm2): 0.747   T-score: -2.1   Z-score: -0.1   Rate of change from previous exam: -10.6%    RIGHT TOTAL HIP:   BMD (in g/cm2): 0.754   T-score: -2.0   Z-score: -0.2   DUAL-FEMUR TOTAL MEAN:   Rate of change from previous exam: -13.7%   LEFT FOREARM (RADIUS 33%):   BMD (in g/cm2): 0.578   T-score: -1.8   Z-score: 0.6   Rate of change from previous exam: -10.6%   FRAX 10-YEAR PROBABILITY OF FRACTURE:   10-year fracture risk is performed using the [facility] FRAX calculator based on patient-reported risk factors.   Major osteoporotic fracture: 20.0% Hip fracture: 5.5%   Other situations known to alter the reliability of the FRAX score should be considered when making treatment decisions, including chronic glucocorticoid use and past treatments. Further guidance on treatment can be found at the [facility] Osteoporosis Foundation's website [email or web address]://www.patton.com/.   IMPRESSION: Osteopenia based on BMD.   Fracture risk is increased. Increased risk is based on low BMD, FRAX calculation and history of fragility fracture.   RECOMMENDATIONS: 1. All patients should optimize calcium  and vitamin D intake.   2. Consider FDA-approved medical therapies in postmenopausal women and men aged 6 years and older, based on the following:   - A hip or vertebral (clinical or morphometric) fracture   - T-score less than or equal to -2.5 and secondary causes have been excluded.   - Low bone mass (T-score between -1.0 and -2.5) and a 10-year probability of a hip fracture greater than or equal to 3% or a 10-year probability of a major osteoporosis-related fracture greater than or equal to 20% based on the [place] -adapted WHO algorithm.   - Clinician judgment and/or patient preferences may indicate treatment for people with 10-year fracture probabilities above or below these levels   3. Patients with diagnosis of osteoporosis or at high risk for fracture should have regular bone mineral density tests. For patients eligible for Medicare, routine testing is allowed  once every 2 years. The testing frequency can be increased to one year for patients who have rapidly progressing disease, those who are receiving or discontinuing medical therapy to restore bone mass, or have additional risk factors.     Electronically Signed   By: Debby Satterfield M.D.   On: 04/14/2024 11:10        Assessment & Plan:   Problem List Items Addressed This Visit       Cardiovascular and Mediastinum   Primary  hypertension - Primary     Musculoskeletal and Integument   Osteopenia (Chronic)     Other   Anxiety and depression   Fracture Risk Assessment Score (FRAX) indicating greater than 3% risk for hip fracture   Fracture Risk Assessment Score (FRAX) indicating greater than 20% risk for major osteoporosis-related fracture   Meds ordered this encounter  Medications   alendronate  (FOSAMAX ) 70 MG tablet    Sig: Take 1 tablet (70 mg total) by mouth every 7 (seven) days. Take with a full glass of water  on an empty stomach.    Dispense:  12 tablet    Refill:  3    Supervising Provider:   ALPHONSA HAMILTON A [[id number]]   Continue current medications. Patient has osteopenia but an elevated FRAX score for hip fracture and major osteoporosis related fracture.  Restart Fosamax  as directed.  Reviewed potential side effects, discontinue medication and contact office if any problems especially GERD or abdominal pain. Return in about 3 months (around 09/24/2024).

## 2024-06-26 ENCOUNTER — Other Ambulatory Visit: Payer: Self-pay | Admitting: Family Medicine

## 2024-06-27 ENCOUNTER — Other Ambulatory Visit: Payer: Self-pay | Admitting: Family Medicine

## 2024-06-28 ENCOUNTER — Other Ambulatory Visit: Payer: Self-pay

## 2024-06-28 MED ORDER — ROSUVASTATIN CALCIUM 20 MG PO TABS: 20.0000 mg | ORAL_TABLET | Freq: Every day | ORAL | 2 refills | Status: AC

## 2024-06-29 ENCOUNTER — Other Ambulatory Visit: Payer: Self-pay | Admitting: Physician Assistant

## 2024-06-29 DIAGNOSIS — I639 Cerebral infarction, unspecified: Secondary | ICD-10-CM

## 2024-06-29 NOTE — Telephone Encounter (Signed)
**Note De-identified  [place] Obfuscation** Please advise 

## 2024-06-30 ENCOUNTER — Other Ambulatory Visit: Payer: Self-pay | Admitting: *Deleted

## 2024-06-30 DIAGNOSIS — I639 Cerebral infarction, unspecified: Secondary | ICD-10-CM

## 2024-06-30 MED ORDER — APIXABAN 5 MG PO TABS: 5.0000 mg | ORAL_TABLET | Freq: Two times a day (BID) | ORAL | 1 refills | Status: DC

## 2024-07-20 ENCOUNTER — Other Ambulatory Visit: Payer: Self-pay | Admitting: Family Medicine

## 2024-07-20 MED ORDER — SERTRALINE HCL 100 MG PO TABS: 100.0000 mg | ORAL_TABLET | Freq: Every day | ORAL | 1 refills | Status: AC

## 2024-07-20 NOTE — Telephone Encounter (Signed)
 Copied from CRM [id number]. Topic: Clinical - Medication Refill >> Jul 20, 2024  8:21 AM Avram MATSU wrote: Medication: sertraline  (ZOLOFT ) 100 MG tablet [[id number]]  Has the patient contacted their pharmacy? Yes (Agent: If no, request that the patient contact the pharmacy for the refill. If patient does not wish to contact the pharmacy document the reason why and proceed with request.) (Agent: If yes, when and what did the pharmacy advise?)  This is the patient's preferred pharmacy:  [id number] [place],  - [address] AT [facility] [address] [place] [address] Phone: [phone] Fax: [phone]   Is this the correct pharmacy for this prescription? Yes If no, delete pharmacy and type the correct one.   Has the prescription been filled recently? No  Is the patient out of the medication? Yes  Has the patient been seen for an appointment in the last year OR does the patient have an upcoming appointment? Yes  Can we respond through MyChart? Yes  Agent: Please be advised that Rx refills may take up to 3 business days. We ask that you follow-up with your pharmacy.

## 2024-08-19 ENCOUNTER — Encounter: Payer: Self-pay | Admitting: Physician Assistant

## 2024-08-23 ENCOUNTER — Inpatient Hospital Stay: Attending: Internal Medicine

## 2024-08-23 ENCOUNTER — Ambulatory Visit (HOSPITAL_COMMUNITY)
Admission: RE | Admit: 2024-08-23 | Discharge: 2024-08-23 | Disposition: A | Discharge status: Home / Self Care | Source: Ambulatory Visit | Attending: Hematology | Admitting: Hematology

## 2024-08-23 DIAGNOSIS — Z85118 Personal history of other malignant neoplasm of bronchus and lung: Secondary | ICD-10-CM | POA: Insufficient documentation

## 2024-08-23 DIAGNOSIS — Z8673 Personal history of transient ischemic attack (TIA), and cerebral infarction without residual deficits: Secondary | ICD-10-CM | POA: Diagnosis not present

## 2024-08-23 DIAGNOSIS — Z9221 Personal history of antineoplastic chemotherapy: Secondary | ICD-10-CM | POA: Diagnosis not present

## 2024-08-23 DIAGNOSIS — Z08 Encounter for follow-up examination after completed treatment for malignant neoplasm: Secondary | ICD-10-CM | POA: Insufficient documentation

## 2024-08-23 DIAGNOSIS — C3490 Malignant neoplasm of unspecified part of unspecified bronchus or lung: Secondary | ICD-10-CM | POA: Insufficient documentation

## 2024-08-23 DIAGNOSIS — N200 Calculus of kidney: Secondary | ICD-10-CM | POA: Diagnosis not present

## 2024-08-23 DIAGNOSIS — A0472 Enterocolitis due to Clostridium difficile, not specified as recurrent: Secondary | ICD-10-CM | POA: Insufficient documentation

## 2024-08-23 DIAGNOSIS — R911 Solitary pulmonary nodule: Secondary | ICD-10-CM | POA: Diagnosis not present

## 2024-08-23 DIAGNOSIS — I700 Atherosclerosis of aorta: Secondary | ICD-10-CM | POA: Diagnosis not present

## 2024-08-23 LAB — CBC WITH DIFFERENTIAL/PLATELET
Abs Immature Granulocytes: 0.03 K/uL (ref 0.00–0.07)
Basophils Absolute: 0.1 K/uL (ref 0.0–0.1)
Basophils Relative: 1 %
Eosinophils Absolute: 0.3 K/uL (ref 0.0–0.5)
Eosinophils Relative: 3 %
HCT: 43.3 % (ref 36.0–46.0)
Hemoglobin: 13.6 g/dL (ref 12.0–15.0)
Immature Granulocytes: 0 %
Lymphocytes Relative: 22 %
Lymphs Abs: 1.9 K/uL (ref 0.7–4.0)
MCH: 29.8 pg (ref 26.0–34.0)
MCHC: 31.4 g/dL (ref 30.0–36.0)
MCV: 95 fL (ref 80.0–100.0)
Monocytes Absolute: 0.6 K/uL (ref 0.1–1.0)
Monocytes Relative: 7 %
Neutro Abs: 5.9 K/uL (ref 1.7–7.7)
Neutrophils Relative %: 67 %
Platelets: 256 K/uL (ref 150–400)
RBC: 4.56 MIL/uL (ref 3.87–5.11)
RDW: 12.2 % (ref 11.5–15.5)
WBC: 8.8 K/uL (ref 4.0–10.5)
nRBC: 0 % (ref 0.0–0.2)

## 2024-08-23 LAB — COMPREHENSIVE METABOLIC PANEL WITH GFR
ALT: 12 U/L (ref 0–44)
AST: 15 U/L (ref 15–41)
Albumin: 3.7 g/dL (ref 3.5–5.0)
Alkaline Phosphatase: 73 U/L (ref 38–126)
Anion gap: 9 (ref 5–15)
BUN: 24 mg/dL — ABNORMAL HIGH (ref 8–23)
CO2: 21 mmol/L — ABNORMAL LOW (ref 22–32)
Calcium: 9.2 mg/dL (ref 8.9–10.3)
Chloride: 111 mmol/L (ref 98–111)
Creatinine, Ser: 0.78 mg/dL (ref 0.44–1.00)
GFR, Estimated: >60 mL/min (ref >60)
Glucose, Bld: 115 mg/dL — ABNORMAL HIGH (ref 70–99)
Potassium: 4 mmol/L (ref 3.5–5.1)
Sodium: 141 mmol/L (ref 135–145)
Total Bilirubin: 0.4 mg/dL (ref 0.0–1.2)
Total Protein: 6.5 g/dL (ref 6.5–8.1)

## 2024-08-23 LAB — MAGNESIUM: Magnesium: 2.2 mg/dL (ref 1.7–2.4)

## 2024-08-23 MED ORDER — IOHEXOL 300 MG/ML  SOLN
100.0000 mL | Freq: Once | INTRAMUSCULAR | Status: AC | PRN
Start: 2024-08-23 — End: 2024-08-23
  Administered 2024-08-23: 100 mL via INTRAVENOUS

## 2024-08-23 MED ORDER — IOHEXOL 9 MG/ML PO SOLN
500.0000 mL | ORAL | Status: AC
  Administered 2024-08-23: 500 mL via ORAL

## 2024-08-23 MED ORDER — IOHEXOL 9 MG/ML PO SOLN
ORAL | Status: AC
  Filled 2024-08-23: qty 1000

## 2024-08-24 ENCOUNTER — Other Ambulatory Visit: Payer: Self-pay | Admitting: *Deleted

## 2024-08-24 DIAGNOSIS — I639 Cerebral infarction, unspecified: Secondary | ICD-10-CM

## 2024-08-24 MED ORDER — APIXABAN 5 MG PO TABS: 5.0000 mg | ORAL_TABLET | Freq: Two times a day (BID) | ORAL | 1 refills | Status: DC

## 2024-08-30 ENCOUNTER — Other Ambulatory Visit: Payer: Self-pay | Admitting: Family Medicine

## 2024-08-30 NOTE — Telephone Encounter (Signed)
 Copied from CRM [id number]. Topic: Clinical - Medication Refill >> Aug 30, 2024  8:43 AM Dawna HERO wrote: Medication:  prochlorperazine  (COMPAZINE ) 10 MG tablet   Has the patient contacted their pharmacy? No (Agent: If no, request that the patient contact the pharmacy for the refill. If patient does not wish to contact the pharmacy document the reason why and proceed with request.) (Agent: If yes, when and what did the pharmacy advise?)  This is the patient's preferred pharmacy:  [id number] - [place], [place] - [address] AT [facility] [address] [place] [address] Phone: [phone] Fax: [phone]   Is this the correct pharmacy for this prescription? Yes If no, delete pharmacy and type the correct one.   Has the prescription been filled recently? Yes  Is the patient out of the medication? Yes  Has the patient been seen for an appointment in the last year OR does the patient have an upcoming appointment? Yes  Can we respond through MyChart? Yes  Agent: Please be advised that Rx refills may take up to 3 business days. We ask that you follow-up with your pharmacy.

## 2024-09-02 ENCOUNTER — Inpatient Hospital Stay: Admitting: Oncology

## 2024-09-02 VITALS — BP 116/67 | HR 77 | Temp 97.7°F | Resp 18 | Wt 105.0 lb

## 2024-09-02 DIAGNOSIS — C3492 Malignant neoplasm of unspecified part of left bronchus or lung: Secondary | ICD-10-CM

## 2024-09-02 DIAGNOSIS — Z08 Encounter for follow-up examination after completed treatment for malignant neoplasm: Secondary | ICD-10-CM | POA: Diagnosis not present

## 2024-09-02 NOTE — Patient Instructions (Signed)
 [facility] at [facility] Discharge Instructions   You were seen and examined today by Dr. Davonna.  She reviewed the results of your lab work which are normal/stable.   She reviewed the results of your CT scan which did not show any evidence of cancer.   We will see you back in 3 months. We will repeat a CT scan prior to this visit.    Return as scheduled.    Thank you for choosing [facility] at [facility] to provide your oncology and hematology care.  To afford each patient quality time with our provider, please arrive at least 15 minutes before your scheduled appointment time.   If you have a lab appointment with the Cancer Center please come in thru the Main Entrance and check in at the main information desk.  You need to re-schedule your appointment should you arrive 10 or more minutes late.  We strive to give you quality time with our providers, and arriving late affects you and other patients whose appointments are after yours.  Also, if you no show three or more times for appointments you may be dismissed from the clinic at the providers discretion.     Again, thank you for choosing [facility].  Our hope is that these requests will decrease the amount of time that you wait before being seen by our physicians.       _____________________________________________________________  Should you have questions after your visit to [facility], please contact our office at [phone] and follow the prompts.  Our office hours are 8:00 a.m. and 4:30 p.m. Monday - Friday.  Please note that voicemails left after 4:00 p.m. may not be returned until the following business day.  We are closed weekends and major holidays.  You do have access to a nurse 24-7, just call the main number to the clinic [phone] and do not press any options, hold on the line and a nurse will answer the phone.    For prescription refill  requests, have your pharmacy contact our office and allow 72 hours.    Due to Covid, you will need to wear a mask upon entering the hospital. If you do not have a mask, a mask will be given to you at the Main Entrance upon arrival. For doctor visits, patients may have 1 support person age 37 or older with them. For treatment visits, patients can not have anyone with them due to social distancing guidelines and our immunocompromised population.

## 2024-09-02 NOTE — Progress Notes (Signed)
 Patient Care Team: Alphonsa Glendia LABOR, MD as PCP - General (Family Medicine) Celestia Joesph SQUIBB, RN as Oncology Nurse Navigator (Medical Oncology)  Clinic Day:  09/12/2024  Referring physician: Alphonsa Glendia LABOR, MD   CHIEF COMPLAINT:  CC: Metastatic adenocarcinoma of the lung PD-L1 99%, K-ras G12 C   Fontaine B Mednick 78 y.o. female was transferred to my care after her prior physician has left.   ASSESSMENT & PLAN:   Assessment & Plan: JESSIE COWHER  is a 78 y.o. female with metastatic adenocarcinoma of the lung  Assessment & Plan Adenocarcinoma of left lung, stage 4 (HCC) Metastatic adenocarcinoma of left lung, metastasis to multiple areas including lymph nodes, muscular, and adrenal. Extensive oncology history below Patient had a stroke after 1 cycle of chemotherapy with immunotherapy, was continued on immunotherapy after.  Discontinued for colitis (lymphocytic) Patient was then on an aggressive for K-ras G 12C mutation and that was discontinued after C. difficile lymphocytic colitis and required hospitalization in December 2024 Patient currently on surveillance  - We reviewed his recent CT CAP together.  Patient has no evidence of disease. - Will continue surveillance with CT scan every 3 months. - Lab was reviewed: CMP: Normal creatinine, normal LFT.  CBC: WNL  Return to clinic in 3 months with CT scan and labs  C. difficile colitis Patient following with GI for this.    The patient understands the plans discussed today and is in agreement with them.  She knows to contact our office if she develops concerns prior to her next appointment.  40 minutes of total time was spent for this patient encounter, including preparation,review of records,  face-to-face counseling with the patient and coordination of care, physical exam, and documentation of the encounter.    LILLETTE Verneta SAUNDERS Teague,acting as a Neurosurgeon for Mickiel Dry, MD.,have documented all relevant documentation on the  behalf of Mickiel Dry, MD,as directed by  Mickiel Dry, MD while in the presence of Mickiel Dry, MD.  I, Mickiel Dry MD, have reviewed the above documentation for accuracy and completeness, and I agree with the above.    Mickiel Dry, MD  [facility] - A DEPT OF [facility] [address] [place] [address] Dept: [phone] Dept Fax: [phone]   Orders Placed This Encounter  Procedures   CT CHEST ABDOMEN PELVIS W CONTRAST    Standing Status:   Future    Expected Date:   12/02/2024    Expiration Date:   09/02/2025    If indicated for the ordered procedure, I authorize the administration of contrast media per Radiology protocol:   Yes    Does the patient have a contrast media/X-ray dye allergy?:   No    Preferred imaging location?:   [facility]    Release to patient:   Immediate    If indicated for the ordered procedure, I authorize the administration of oral contrast media per Radiology protocol:   Yes   CBC with Differential    Standing Status:   Future    Expected Date:   11/29/2024    Expiration Date:   02/27/2025   Comprehensive metabolic panel    Standing Status:   Future    Expected Date:   11/29/2024    Expiration Date:   02/27/2025   Magnesium     Standing Status:   Future    Expected Date:   11/29/2024    Expiration Date:  02/27/2025     ONCOLOGY HISTORY:   I have reviewed her chart and materials related to her cancer extensively and collaborated history with the patient. Summary of oncologic history is as follows:   Diagnosis: Metastatic adenocarcinoma of the left lung PD-L1 99%, K-ras G12 C   -11/19/2022: CT chest: 2.5 cm partially cavitary left lower lobe nodule with extensive low-density adenopathy in the left lower neck, left supraclavicular region, left mediastinum, left hilum, and left upper quadrant of the abdomen, compatible with malignancy. Scattered  subcutaneous nodules in the chest, and several left upper quadrant omental/paracolic nodules, suspicious for malignancy. Nonspecific right adrenal mass 1.8 by 1.2 cm. -11/19/2022: CT soft tissue neck: Lymphadenopathy in the left lower neck beginning at the level 4 nodal station and extending to the level 6 nodal station. The largest node measures 2.2 x 2.8 cm in size at level 6. -11/29/2022: MRI Brain: No evidence of intracranial metastatic disease.  -12/06/2022: Initial PET: Hypermetabolic LEFT lobe pulmonary nodule consistent with primary bronchogenic carcinoma. Hypermetabolic mediastinal and LEFT supraclavicular adenopathy. The enlarged AP window nodal metastasis impinges upon the LEFT recurrent laryngeal nerve (LEFT focal cord paralysis). Multiple hypermetabolic cutaneous nodules within the chest, abdomen, pelvis and extremities. RIGHT adrenal gland hypermetabolic metastasis. Peritoneal and  retroperitoneal nodular metastasis. Muscular metastasis primarily in the pelvis. -12/16/2022: Left supraclavicular lymph node biopsy.  Pathology: Metastatic adenocarcinoma. IHC cells positive for CK7 and TTF-1, and are negative for CK20 p40 and Napsin.  -12/16/2022: [facility]:  K-ras G 12C.  MS-stable.  TMB-low.  PD-L1 TPS-99%.  -12/30/2022: 1 cycle of carboplatin , pembrolizumab  and pemetrexed , discontinued when patient was hospitalized for a stroke -02/18/2023-08/18/2023: Keytruda , discontinued secondary to diarrhea and lymphocytic colitis requiring hospitalization (improved s/p 8 weeks of Entocort). -09/26/2023-11/13/2023: Adagrasib  600 mg twice daily, discontinued after patient was hospitalized from 11/13/2023 to 11/15/2023 with C. diff lymphocytic colitis -11/2023-current: Patient is under surveillance with imaging showing NED  Current Treatment:  Surveillance  INTERVAL HISTORY:   GROVER WOODFIELD is here today for follow up and to establish care with me for metastatic left lung adenocarcinoma.  Patient is accompanied by her sister.  Nelissa reports feeling well overall. She has regular follow-up with gastroenterology for C. diff and states she was started on a new medication since her last oncology visit. She is not being followed by neurology for her history of stroke. Miosha has been taking Eliquis  since her stroke diagnosis after completing 1 cycle of carboplatin , pembrolizumab  and pemetrexed , however Ryker has not had any more strokes since the event.  Christne lives in [place], [place]. She is not an active smoker.   I have reviewed the past medical history, past surgical history, social history and family history with the patient and they are unchanged from previous note.  ALLERGIES:  is allergic to codeine.  MEDICATIONS:  Current Outpatient Medications  Medication Sig Dispense Refill   acetaminophen  (TYLENOL ) 500 MG tablet Take 1,000 mg by mouth every 6 (six) hours as needed for mild pain (pain score 1-3).     alendronate  (FOSAMAX ) 70 MG tablet Take 1 tablet (70 mg total) by mouth every 7 (seven) days. Take with a full glass of water  on an empty stomach. 12 tablet 3   ALPRAZolam  (XANAX ) 0.5 MG tablet TAKE 1 TABLET AT BEDTIME AS NEEDED FOR ANXIETY OR SLEEP 30 tablet 2   apixaban  (ELIQUIS ) 5 MG TABS tablet Take 1 tablet (5 mg total) by mouth 2 (two) times daily. 60 tablet 1   famotidine  (PEPCID )  40 MG tablet TAKE 1 TABLET BY MOUTH EVERY DAY 90 tablet 1   lansoprazole  (PREVACID ) 30 MG capsule TAKE ONE CAPSULE BY MOUTH DAILY FOR ACID REFLUX 90 capsule 1   loperamide (IMODIUM) 2 MG capsule Take 2 mg by mouth as needed for diarrhea or loose stools.     ondansetron  (ZOFRAN ) 8 MG tablet TAKE 0.5 TABLETS (4 MG TOTAL) BY MOUTH EVERY 8 (EIGHT) HOURS AS NEEDED FOR NAUSEA OR VOMITING. 20 tablet 0   potassium chloride  SA (KLOR-CON  M20) 20 MEQ tablet Take 1 tablet (20 mEq total) by mouth 3 (three) times daily. 270 tablet 1   prochlorperazine  (COMPAZINE ) 10 MG tablet Take 1 tablet (10 mg total)  by mouth every 6 (six) hours as needed for nausea or vomiting. 30 tablet 0   rosuvastatin  (CRESTOR ) 20 MG tablet Take 1 tablet (20 mg total) by mouth at bedtime. 90 tablet 2   sertraline  (ZOLOFT ) 100 MG tablet Take 1 tablet (100 mg total) by mouth daily. 90 tablet 1   topiramate  (TOPAMAX ) 100 MG tablet TAKE 1 TABLET BY MOUTH TWICE A DAY 180 tablet 0   vancomycin  (VANCOCIN ) 125 MG capsule 125 mg orally 4 times daily for 14 days, then 125 mg orally twice daily for 7 days, then 125 mg orally once daily for 7 days, then 125 mg orally every 2 days for 8 weeks 100 capsule 0   VOWST CAPS Take 2 capsules by mouth 2 (two) times daily.     No current facility-administered medications for this visit.    REVIEW OF SYSTEMS:   Constitutional: Denies fevers, chills or abnormal weight loss Eyes: Denies blurriness of vision Ears, nose, mouth, throat, and face: Denies mucositis or sore throat Respiratory: Denies cough, dyspnea or wheezes Cardiovascular: Denies palpitation, chest discomfort or lower extremity swelling Gastrointestinal:  Denies nausea, heartburn or change in bowel habits Skin: Denies abnormal skin rashes Lymphatics: Denies new lymphadenopathy or easy bruising Neurological:Denies numbness, tingling or new weaknesses Behavioral/Psych: Mood is stable, no new changes  All other systems were reviewed with the patient and are negative.   VITALS:  Blood pressure 116/67, pulse 77, temperature 97.7 F (36.5 C), temperature source Tympanic, resp. rate 18, weight 105 lb (47.6 kg), SpO2 96%.  Wt Readings from Last 3 Encounters:  09/02/24 105 lb (47.6 kg)  06/24/24 98 lb 3.2 oz (44.5 kg)  06/09/24 96 lb 6.4 oz (43.7 kg)    Body mass index is 23.54 kg/m.  Performance status (ECOG): 2 - Symptomatic, <50% confined to bed  PHYSICAL EXAM:   GENERAL:alert, no distress and comfortable SKIN: skin color, texture, turgor are normal, no rashes or significant lesions LYMPH:  no palpable lymphadenopathy  in the cervical, axillary or inguinal LUNGS: clear to auscultation and percussion with normal breathing effort HEART: regular rate & rhythm and no murmurs and no lower extremity edema ABDOMEN:abdomen soft, non-tender and normal bowel sounds Musculoskeletal:no cyanosis of digits and no clubbing  NEURO: alert & oriented x 3 with fluent speech, no focal motor/sensory deficits  LABORATORY DATA:  I have reviewed the data as listed   Lab Results  Component Value Date   WBC 8.8 08/23/2024   NEUTROABS 5.9 08/23/2024   HGB 13.6 08/23/2024   HCT 43.3 08/23/2024   MCV 95.0 08/23/2024   PLT 256 08/23/2024      Chemistry      Component Value Date/Time   NA 141 08/23/2024 1321   NA 144 11/21/2023 1035   K 4.0 08/23/2024 1321  CL 111 08/23/2024 1321   CO2 21 (L) 08/23/2024 1321   BUN 24 (H) 08/23/2024 1321   BUN 16 11/21/2023 1035   CREATININE 0.78 08/23/2024 1321   CREATININE 1.20 (H) 09/29/2023 0836      Component Value Date/Time   CALCIUM  9.2 08/23/2024 1321   ALKPHOS 73 08/23/2024 1321   AST 15 08/23/2024 1321   AST 13 (L) 09/29/2023 0836   ALT 12 08/23/2024 1321   ALT 13 09/29/2023 0836   BILITOT 0.4 08/23/2024 1321   BILITOT 0.3 09/29/2023 0836       RADIOGRAPHIC STUDIES: I have personally reviewed the radiological images as listed and agreed with the findings in the report.  CT CHEST ABDOMEN PELVIS W CONTRAST CLINICAL DATA:  Metastatic disease evaluation non-small-cell lung cancer restaging * Tracking Code: BO *  EXAM: CT CHEST, ABDOMEN, AND PELVIS WITH CONTRAST  TECHNIQUE: Multidetector CT imaging of the chest, abdomen and pelvis was performed following the standard protocol during bolus administration of intravenous contrast.  RADIATION DOSE REDUCTION: This exam was performed according to the departmental dose-optimization program which includes automated exposure control, adjustment of the mA and/or kV according to patient size and/or use of iterative  reconstruction technique.  CONTRAST:  OMNIPAQUE  IOHEXOL  300 MG/ML SOLN additional oral enteric contrast  COMPARISON:  04/22/2024  FINDINGS: CT CHEST FINDINGS  Cardiovascular: Aortic atherosclerosis. Normal heart size. Left and right coronary artery calcifications. No pericardial effusion.  Mediastinum/Nodes: No enlarged mediastinal, hilar, or axillary lymph nodes. Large hiatal hernia with intrathoracic position of the gastric fundus. Thyroid  gland, trachea, and esophagus demonstrate no significant findings.  Lungs/Pleura: Unchanged post treatment appearance of the left lower lobe with irregular nodularity associated with a small pneumatocele (series 4, image 24). Additional bland appearing bandlike scarring of the lung bases. No pleural effusion or pneumothorax.  Musculoskeletal: No chest wall abnormality. No acute osseous findings.  CT ABDOMEN PELVIS FINDINGS  Hepatobiliary: No solid liver abnormality is seen. No gallstones, gallbladder wall thickening, or biliary dilatation.  Pancreas: Unremarkable. No pancreatic ductal dilatation or surrounding inflammatory changes.  Spleen: Normal in size without significant abnormality.  Adrenals/Urinary Tract: Adrenal glands are unremarkable. Small nonobstructive calculus of the superior pole of the left kidney no right-sided calculi, ureteral calculi or hydronephrosis. Bladder is unremarkable.  Stomach/Bowel: Stomach is within normal limits. Appendix not clearly visualized and may be surgically absent. No evidence of bowel wall thickening, distention, or inflammatory changes.  Vascular/Lymphatic: Aortic atherosclerosis. No enlarged abdominal or pelvic lymph nodes.  Reproductive: Hysterectomy.  Other: No abdominal wall hernia or abnormality. No ascites.  Musculoskeletal: No acute osseous findings.  IMPRESSION: 1. Unchanged post treatment appearance of the left lower lobe with irregular nodularity associated with a  small pneumatocele. 2. No evidence of lymphadenopathy or metastatic disease in the chest, abdomen, or pelvis. 3. Large hiatal hernia with intrathoracic position of the gastric fundus. 4. Nonobstructive left nephrolithiasis. 5. Coronary artery disease.  Aortic Atherosclerosis (ICD10-I70.0).  Electronically Signed   By: Marolyn JONETTA Jaksch M.D.   On: 08/26/2024 10:08

## 2024-09-22 ENCOUNTER — Encounter (INDEPENDENT_AMBULATORY_CARE_PROVIDER_SITE_OTHER): Payer: Self-pay | Admitting: Gastroenterology

## 2024-09-23 ENCOUNTER — Encounter: Payer: Self-pay | Admitting: Gastroenterology

## 2024-09-30 ENCOUNTER — Ambulatory Visit: Admitting: Nurse Practitioner

## 2024-09-30 ENCOUNTER — Encounter: Payer: Self-pay | Admitting: Nurse Practitioner

## 2024-09-30 VITALS — BP 127/85 | HR 65 | Temp 97.5°F | Ht <= 58 in | Wt 105.0 lb

## 2024-09-30 DIAGNOSIS — K219 Gastro-esophageal reflux disease without esophagitis: Secondary | ICD-10-CM | POA: Diagnosis not present

## 2024-09-30 DIAGNOSIS — F419 Anxiety disorder, unspecified: Secondary | ICD-10-CM | POA: Diagnosis not present

## 2024-09-30 DIAGNOSIS — I10 Essential (primary) hypertension: Secondary | ICD-10-CM | POA: Diagnosis not present

## 2024-09-30 DIAGNOSIS — F32A Depression, unspecified: Secondary | ICD-10-CM

## 2024-09-30 DIAGNOSIS — R110 Nausea: Secondary | ICD-10-CM

## 2024-09-30 MED ORDER — PROCHLORPERAZINE MALEATE 10 MG PO TABS: 10.0000 mg | ORAL_TABLET | Freq: Four times a day (QID) | ORAL | 0 refills | Status: AC | PRN

## 2024-09-30 NOTE — Progress Notes (Signed)
 Subjective:    Patient ID: Patricia [name], female    DOB: 03/05/1946, 78 y.o.   MRN: [id number]  HPI Discussed the use of AI scribe software for clinical note transcription with the patient, who gave verbal consent to proceed.  History of Present Illness Patricia [name] is a 78 year old female who presents for a routine follow-up visit.   She has gained a little weight, currently at 105 lbs, and prefers to keep her weight above 100 lbs. Her appetite is good.  She has a history of recurrent colitis, recently managed with VOWST, which has effectively controlled her diarrhea.  She takes Compazine  for nausea, which helps with her stomach pain. No other medications for nausea are taken. Needs refill today. GERD well controlled with Prevacid .  She is on sertraline  (Zoloft ) for anxiety and depression, which helps to some extent, although she still experiences depression. Her friend notes increased social isolation since she stopped working and driving due to strokes, impacting her mental health.  She has a history of strokes, affecting her cognitive speed and ability to drive. Her husband is concerned about her response time while driving, although she feels capable. Her eye doctor has cleared her vision for driving.  No chest pain or shortness of breath, but she reports occasional coughing, which she thinks is due to sinus issues. No swelling in her feet or ankles.  She is taking potassium supplements and acid reflux medication. Her cholesterol medication is up to date with refills.  Review of Systems  Constitutional:  Positive for fatigue.  Respiratory:  Positive for cough. Negative for chest tightness, shortness of breath and wheezing.        Occasional cough due to sinus drainage.   Cardiovascular:  Negative for chest pain and leg swelling.  Gastrointestinal:  Positive for abdominal pain and nausea. Negative for vomiting.      09/02/2024    1:32 PM  Depression screen PHQ 2/9   Decreased Interest 0  Down, Depressed, Hopeless 2  PHQ - 2 Score 2  Altered sleeping 0  Tired, decreased energy 0  Change in appetite 0  Feeling bad or failure about yourself  0  Trouble concentrating 0  Moving slowly or fidgety/restless 0  Suicidal thoughts 2  PHQ-9 Score 4  Difficult doing work/chores Somewhat difficult      02/19/2024    8:57 AM 01/08/2024    3:42 PM 12/25/2023    2:34 PM 12/15/2023    3:49 PM  GAD 7 : Generalized Anxiety Score  Nervous, Anxious, on Edge 0 0 3 2  Control/stop worrying 1 0 3 2  Worry too much - different things 1 0 3 2  Trouble relaxing 2 0 3 2  Restless 1 0 0 0  Easily annoyed or irritable 1 0 2 2  Afraid - awful might happen 0 0 0 0  Total GAD 7 Score 6 0 14 10  Anxiety Difficulty Very difficult Not difficult at all Somewhat difficult Somewhat difficult    Social History   Tobacco Use   Smoking status: Former    Current packs/day: 0.00    Average packs/day: 1 pack/day for 20.0 years (20.0 ttl pk-yrs)    Types: Cigarettes    Start date: 09/10/1964    Quit date: 09/10/1984    Years since quitting: 40.0   Smokeless tobacco: Never  Vaping Use   Vaping status: Never Used  Substance Use Topics   Alcohol use: No   Drug use:  No       Objective:   Physical Exam Vitals and nursing note reviewed.  Constitutional:      General: She is not in acute distress. Cardiovascular:     Rate and Rhythm: Normal rate and regular rhythm.     Heart sounds: Normal heart sounds.  Pulmonary:     Effort: Pulmonary effort is normal.     Breath sounds: Normal breath sounds.  Abdominal:     General: There is no distension.     Palpations: Abdomen is soft.     Tenderness: There is no abdominal tenderness. There is no guarding or rebound.  Musculoskeletal:     Right lower leg: No edema.     Left lower leg: No edema.  Neurological:     Mental Status: She is alert and oriented to person, place, and time.  Psychiatric:        Mood and Affect: Mood  normal.        Behavior: Behavior normal.        Thought Content: Thought content normal.    Today's Vitals   09/30/24 1049  BP: 127/85  Pulse: 65  Temp: (!) 97.5 F (36.4 C)  SpO2: 98%  Weight: 105 lb (47.6 kg)  Height: 4' 8 (1.422 m)   Body mass index is 23.54 kg/m.        Assessment & Plan:  1. Primary hypertension (Primary) BP well controlled with no adverse effects. Continue current regimen.   2. Anxiety and depression Continues depressive symptoms despite sertraline , which may help with anxiety. - Continue sertraline . - Encourage social engagement and finding purpose to improve mental health.  3. GERD without esophagitis Continue current regimen.  4. Nausea Managed with Compazine , preferred due to lack of drowsiness. - prochlorperazine  (COMPAZINE ) 10 MG tablet; Take 1 tablet (10 mg total) by mouth every 6 (six) hours as needed for nausea or vomiting.  Dispense: 30 tablet; Refill: 0  Defers flu vaccine today. Question about getting vaccine due to previous advice to hold. Unclear about the reason. Will check with oncology. No reason was noted in the chart and patient is no longer actively receiving treatment for cancer.   Return in about 6 months (around 03/31/2025). Repeat labs at that time.

## 2024-10-16 ENCOUNTER — Other Ambulatory Visit: Payer: Self-pay | Admitting: Oncology

## 2024-10-16 DIAGNOSIS — I639 Cerebral infarction, unspecified: Secondary | ICD-10-CM

## 2024-11-23 ENCOUNTER — Ambulatory Visit: Admitting: Gastroenterology

## 2024-11-23 ENCOUNTER — Encounter: Payer: Self-pay | Admitting: Gastroenterology

## 2024-11-23 VITALS — BP 137/80 | HR 66 | Temp 98.7°F | Ht <= 58 in | Wt 108.6 lb

## 2024-11-23 DIAGNOSIS — K5900 Constipation, unspecified: Secondary | ICD-10-CM | POA: Diagnosis not present

## 2024-11-23 DIAGNOSIS — Z8619 Personal history of other infectious and parasitic diseases: Secondary | ICD-10-CM | POA: Diagnosis not present

## 2024-11-23 DIAGNOSIS — K219 Gastro-esophageal reflux disease without esophagitis: Secondary | ICD-10-CM

## 2024-11-23 DIAGNOSIS — K449 Diaphragmatic hernia without obstruction or gangrene: Secondary | ICD-10-CM

## 2024-11-23 DIAGNOSIS — A0471 Enterocolitis due to Clostridium difficile, recurrent: Secondary | ICD-10-CM

## 2024-11-23 DIAGNOSIS — K52832 Lymphocytic colitis: Secondary | ICD-10-CM

## 2024-11-23 NOTE — Progress Notes (Signed)
 GI Office Note    Referring Provider: Alphonsa Glendia LABOR, MD Primary Care Physician:  Alphonsa Glendia LABOR, MD  Primary Gastroenterologist: Ozell Hollingshead, MD   Chief Complaint   Chief Complaint  Patient presents with   Follow-up    Doing well, no issues    History of Present Illness   Patricia [name] is a 78 y.o. female presenting today today for follow-up.  She has a history of recurrent C. difficile, GERD, large hiatal hernia, lymphocytic colitis.  Last seen July 2025.  Please see detailed note from June 09, 2024 regarding pertinent colitis history.  Discussed the use of AI scribe software for clinical note transcription with the patient, who gave verbal consent to proceed.  History of Present Illness Patricia [name] is a 78 year old female with a history of C. difficile infection who presents for follow-up.  Patient with recurrent C. difficile.  Dificid  was cost prohibitive.  Treated with 10-day course of vancomycin  back in December 2024.  Shortly thereafter required vancomycin  taper.  Ultimately we recommended Vowst given recurrent C. difficile and her being high risk for recurrence with potential immunotherapy again in the future for her metastatic lung cancer.   She reports completing Vowst after last OV. She is doing well. Diarrhea has not recurred, with no loose stools. She now is back to her mild baseline constipation, with bowel movements once daily or sometimes skipping a day. She uses stool softeners, one to two pills daily, but feels they are not strong enough still passing some hear stool.  She has had no recent abdominal pain, heartburn, or change in appetite. Her cancer is stable, and she has not received immunotherapy recently. She goes for repeat 3 month scans in 12/2024.    She reports good control of her reflux. Appetite good. No N/V.   Wt Readings from Last 3 Encounters:  11/23/24 108 lb 9.6 oz (49.3 kg)  09/30/24 105 lb (47.6 kg)  09/02/24 105 lb (47.6 kg)     Prior Data     Results   Labs from 08/23/2024: Creatinine 0.78, LFTs normal, hemoglobin 13.6, crit 43.3, white cell count 8.8, platelet 256.  Colonoscopy July 2024: -Friability with contact bleeding in the ascending colon and in the cecum pathology consistent with lymphocytic colitis -Distal rectum and anal verge normal   EGD October 2022: -Normal esophagus -Medium sized hiatal hernia -Normal stomach -Normal duodenal bulb and second portion of duodenum   Medications   Current Outpatient Medications  Medication Sig Dispense Refill   acetaminophen  (TYLENOL ) 500 MG tablet Take 1,000 mg by mouth every 6 (six) hours as needed for mild pain (pain score 1-3).     alendronate  (FOSAMAX ) 70 MG tablet Take 1 tablet (70 mg total) by mouth every 7 (seven) days. Take with a full glass of water  on an empty stomach. 12 tablet 3   ALPRAZolam  (XANAX ) 0.5 MG tablet TAKE 1 TABLET AT BEDTIME AS NEEDED FOR ANXIETY OR SLEEP 30 tablet 2   ELIQUIS  5 MG TABS tablet TAKE 1 TABLET BY MOUTH TWICE A DAY 60 tablet 1   famotidine  (PEPCID ) 40 MG tablet TAKE 1 TABLET BY MOUTH EVERY DAY 90 tablet 1   lansoprazole  (PREVACID ) 30 MG capsule TAKE ONE CAPSULE BY MOUTH DAILY FOR ACID REFLUX 90 capsule 1   potassium chloride  SA (KLOR-CON  M20) 20 MEQ tablet Take 1 tablet (20 mEq total) by mouth 3 (three) times daily. 270 tablet 1   prochlorperazine  (COMPAZINE ) 10 MG  tablet Take 1 tablet (10 mg total) by mouth every 6 (six) hours as needed for nausea or vomiting. 30 tablet 0   rosuvastatin  (CRESTOR ) 20 MG tablet Take 1 tablet (20 mg total) by mouth at bedtime. 90 tablet 2   sertraline  (ZOLOFT ) 100 MG tablet Take 1 tablet (100 mg total) by mouth daily. 90 tablet 1   No current facility-administered medications for this visit.    Allergies   Allergies as of 11/23/2024 - Review Complete 11/23/2024  Allergen Reaction Noted   Codeine Nausea Only 09/11/2011      Review of Systems   General: Negative for anorexia,  weight loss, fever, chills, fatigue, weakness. ENT: Negative for hoarseness, difficulty swallowing , nasal congestion. CV: Negative for chest pain, angina, palpitations, dyspnea on exertion, peripheral edema.  Respiratory: Negative for dyspnea at rest, dyspnea on exertion, cough, sputum, wheezing.  GI: See history of present illness. GU:  Negative for dysuria, hematuria, urinary incontinence, urinary frequency, nocturnal urination.  Endo: Negative for unusual weight change.     Physical Exam   BP 137/80   Pulse 66   Temp 98.7 F (37.1 C) (Oral)   Ht 4' 8 (1.422 m)   Wt 108 lb 9.6 oz (49.3 kg)   SpO2 96%   BMI 24.35 kg/m    General: Well-nourished, well-developed in no acute distress.  Eyes: No icterus. Mouth: Oropharyngeal mucosa moist and pink   Abdomen: Bowel sounds are normal, nontender, nondistended, no hepatosplenomegaly or masses,  no abdominal bruits or hernia , no rebound or guarding.  Rectal: not performed Extremities: No lower extremity edema. No clubbing or deformities. Neuro: Alert and oriented x 4   Skin: Warm and dry, no jaundice.   Psych: Alert and cooperative, normal mood and affect.  Labs   See above  Imaging Studies   No results found.  Assessment/Plan:    Assessment & Plan History of Recurrent Clostridioides difficile infection -doing well. No recurrence of diarrhea post prolonged Vanc taper and Vowst.  Constipation Mild constipation persists despite stool softeners. Current regimen insufficient. - Trial of Miralax , starting with a teaspoon in liquid daily, can adjust dosage as needed based on response.  GERD/large hiatal hernia: -Doing well, continue Prevacid  and Pepcid .   Sonny RAMAN. Ezzard, MHS, PA-C [facility]

## 2024-11-23 NOTE — Patient Instructions (Signed)
 I am glad your diarrhea has resolved. For mild constipation, consider adding miralax  (or generic) daily. You can start out at 1 teaspoon daily but after one week, if stools remain hard, you can increase to 1/2 capful to 1 capful daily. Please call if any questions.   We will see you back in one year or sooner if needed. Please call with any questions or concerns.

## 2024-11-28 ENCOUNTER — Other Ambulatory Visit: Payer: Self-pay | Admitting: Nurse Practitioner

## 2024-12-06 ENCOUNTER — Encounter: Payer: Self-pay | Admitting: *Deleted

## 2024-12-07 ENCOUNTER — Ambulatory Visit: Payer: Self-pay

## 2024-12-07 NOTE — Telephone Encounter (Signed)
 FYI Only or Action Required?: Action required by provider: request for appointment and  .  Patient was last seen in primary care on 09/30/2024 by Mauro Elveria BROCKS, NP.  Called Nurse Triage reporting Cough.  Symptoms began several days ago.  Interventions attempted: OTC medications: Mucinex .  Symptoms are: stable.  Triage Disposition: Home Care  Patient/caregiver understands and will follow disposition?:   Copied from CRM #[id number]. Topic: Clinical - Red Word Triage >> Dec 07, 2024  4:07 PM Terri G wrote: Red Word that prompted transfer to Nurse Triage: Speaking to her husband Beverley. Calling regarding his wife. Experiencing coughing spells , congested and has a high fever Reason for Disposition  Cough with cold symptoms (e.g., runny nose, postnasal drip, throat clearing)  Answer Assessment - Initial Assessment Questions No open appointments in [place] till March. Patient does not want to go to UC or other practice. Wants to be seen or just a med called in. [facility].   1. ONSET: When did the cough begin?      2-3 days ago 3. SPUTUM: Describe the color of your sputum (e.g., none, dry cough; clear, white, yellow, green)     Clear 4. HEMOPTYSIS: Are you coughing up any blood? If Yes, ask: How much? (e.g., flecks, streaks, tablespoons, etc.)     Denies 5. DIFFICULTY BREATHING: Are you having difficulty breathing? If Yes, ask: How bad is it? (e.g., mild, moderate, severe)      She states due to congestion it's making it harder 6. FEVER: Do you have a fever? If Yes, ask: What is your temperature, how was it measured, and when did it start?     Little over 99 7. CARDIAC HISTORY: Do you have any history of heart disease? (e.g., heart attack, congestive heart failure)      Denies 8. LUNG HISTORY: Do you have any history of lung disease?  (e.g., pulmonary embolus, asthma, emphysema)     Lung cancer 10. OTHER SYMPTOMS: Do you have any other  symptoms? (e.g., runny nose, wheezing, chest pain)       Sore throat  Protocols used: Cough - Acute Productive-A-AH

## 2024-12-10 NOTE — Telephone Encounter (Signed)
 Patient states is doing better and will give a call next week if needed.

## 2024-12-13 ENCOUNTER — Ambulatory Visit (HOSPITAL_COMMUNITY)

## 2024-12-13 ENCOUNTER — Inpatient Hospital Stay: Payer: Self-pay | Attending: Internal Medicine

## 2024-12-15 ENCOUNTER — Other Ambulatory Visit: Payer: Self-pay | Admitting: Oncology

## 2024-12-15 DIAGNOSIS — I639 Cerebral infarction, unspecified: Secondary | ICD-10-CM

## 2024-12-21 ENCOUNTER — Ambulatory Visit (HOSPITAL_COMMUNITY)
Admission: RE | Admit: 2024-12-21 | Discharge: 2024-12-21 | Disposition: A | Discharge status: Home / Self Care | Source: Ambulatory Visit | Attending: Oncology | Admitting: Oncology

## 2024-12-21 ENCOUNTER — Inpatient Hospital Stay: Attending: Internal Medicine

## 2024-12-21 DIAGNOSIS — C3492 Malignant neoplasm of unspecified part of left bronchus or lung: Secondary | ICD-10-CM

## 2024-12-21 DIAGNOSIS — Z8619 Personal history of other infectious and parasitic diseases: Secondary | ICD-10-CM | POA: Diagnosis not present

## 2024-12-21 DIAGNOSIS — R5383 Other fatigue: Secondary | ICD-10-CM | POA: Diagnosis not present

## 2024-12-21 DIAGNOSIS — K449 Diaphragmatic hernia without obstruction or gangrene: Secondary | ICD-10-CM | POA: Insufficient documentation

## 2024-12-21 DIAGNOSIS — Z85118 Personal history of other malignant neoplasm of bronchus and lung: Secondary | ICD-10-CM | POA: Diagnosis present

## 2024-12-21 DIAGNOSIS — Z8673 Personal history of transient ischemic attack (TIA), and cerebral infarction without residual deficits: Secondary | ICD-10-CM | POA: Insufficient documentation

## 2024-12-21 DIAGNOSIS — J984 Other disorders of lung: Secondary | ICD-10-CM | POA: Insufficient documentation

## 2024-12-21 DIAGNOSIS — Z9221 Personal history of antineoplastic chemotherapy: Secondary | ICD-10-CM | POA: Insufficient documentation

## 2024-12-21 DIAGNOSIS — N132 Hydronephrosis with renal and ureteral calculous obstruction: Secondary | ICD-10-CM | POA: Insufficient documentation

## 2024-12-21 DIAGNOSIS — I700 Atherosclerosis of aorta: Secondary | ICD-10-CM | POA: Diagnosis not present

## 2024-12-21 DIAGNOSIS — I2510 Atherosclerotic heart disease of native coronary artery without angina pectoris: Secondary | ICD-10-CM | POA: Diagnosis not present

## 2024-12-21 LAB — COMPREHENSIVE METABOLIC PANEL WITH GFR
ALT: 8 U/L (ref 0–44)
AST: 18 U/L (ref 15–41)
Albumin: 4.4 g/dL (ref 3.5–5.0)
Alkaline Phosphatase: 67 U/L (ref 38–126)
Anion gap: 15 (ref 5–15)
BUN: 15 mg/dL (ref 8–23)
CO2: 18 mmol/L — ABNORMAL LOW (ref 22–32)
Calcium: 9.6 mg/dL (ref 8.9–10.3)
Chloride: 108 mmol/L (ref 98–111)
Creatinine, Ser: 0.82 mg/dL (ref 0.44–1.00)
GFR, Estimated: >60 mL/min
Glucose, Bld: 91 mg/dL (ref 70–99)
Potassium: 3.8 mmol/L (ref 3.5–5.1)
Sodium: 141 mmol/L (ref 135–145)
Total Bilirubin: 0.2 mg/dL (ref 0.0–1.2)
Total Protein: 6.5 g/dL (ref 6.5–8.1)

## 2024-12-21 LAB — CBC WITH DIFFERENTIAL/PLATELET
Abs Immature Granulocytes: 0.05 K/uL (ref 0.00–0.07)
Basophils Absolute: 0.1 K/uL (ref 0.0–0.1)
Basophils Relative: 1 %
Eosinophils Absolute: 0.1 K/uL (ref 0.0–0.5)
Eosinophils Relative: 1 %
HCT: 41.9 % (ref 36.0–46.0)
Hemoglobin: 13.4 g/dL (ref 12.0–15.0)
Immature Granulocytes: 1 %
Lymphocytes Relative: 32 %
Lymphs Abs: 3.2 K/uL (ref 0.7–4.0)
MCH: 29.5 pg (ref 26.0–34.0)
MCHC: 32 g/dL (ref 30.0–36.0)
MCV: 92.1 fL (ref 80.0–100.0)
Monocytes Absolute: 0.6 K/uL (ref 0.1–1.0)
Monocytes Relative: 6 %
Neutro Abs: 6.2 K/uL (ref 1.7–7.7)
Neutrophils Relative %: 59 %
Platelets: 271 K/uL (ref 150–400)
RBC: 4.55 MIL/uL (ref 3.87–5.11)
RDW: 13 % (ref 11.5–15.5)
WBC: 10.2 K/uL (ref 4.0–10.5)
nRBC: 0 % (ref 0.0–0.2)

## 2024-12-21 LAB — MAGNESIUM: Magnesium: 2.1 mg/dL (ref 1.7–2.4)

## 2024-12-21 MED ORDER — IOHEXOL 9 MG/ML PO SOLN
ORAL | Status: AC
  Filled 2024-12-21: qty 1000

## 2024-12-21 MED ORDER — IOHEXOL 300 MG/ML  SOLN
100.0000 mL | Freq: Once | INTRAMUSCULAR | Status: AC | PRN
  Administered 2024-12-21: 100 mL via INTRAVENOUS

## 2024-12-22 ENCOUNTER — Other Ambulatory Visit: Payer: Self-pay | Admitting: Family Medicine

## 2024-12-23 ENCOUNTER — Inpatient Hospital Stay: Payer: Self-pay | Admitting: Oncology

## 2024-12-29 ENCOUNTER — Inpatient Hospital Stay: Admitting: Oncology

## 2024-12-29 VITALS — BP 121/88 | HR 68 | Temp 98.2°F | Resp 18 | Wt 108.9 lb

## 2024-12-29 DIAGNOSIS — C3492 Malignant neoplasm of unspecified part of left bronchus or lung: Secondary | ICD-10-CM | POA: Diagnosis not present

## 2024-12-29 DIAGNOSIS — Z85118 Personal history of other malignant neoplasm of bronchus and lung: Secondary | ICD-10-CM | POA: Diagnosis not present

## 2024-12-29 DIAGNOSIS — R5382 Chronic fatigue, unspecified: Secondary | ICD-10-CM | POA: Diagnosis not present

## 2024-12-29 NOTE — Progress Notes (Signed)
 " Patient Care Team: Alphonsa Glendia LABOR, MD as PCP - General (Family Medicine) Celestia Joesph SQUIBB, RN as Oncology Nurse Navigator (Medical Oncology)  Clinic Day:  12/29/2024  Referring physician: Alphonsa Glendia LABOR, MD   CHIEF COMPLAINT:  CC: Metastatic adenocarcinoma of the lung PD-L1 99%, K-ras G12 C    ASSESSMENT & PLAN:   Assessment & Plan: Patricia [name]  is a 79 y.o. female with metastatic adenocarcinoma of the lung  Assessment and Plan Assessment & Plan Adenocarcinoma of left lung, stage 4  Metastatic adenocarcinoma of left lung, metastasis to multiple areas including lymph nodes, muscular, and adrenal. Extensive oncology history below Patient had a stroke after 1 cycle of chemotherapy with immunotherapy, was continued on immunotherapy after.  Discontinued for colitis (lymphocytic) Patient was then on an aggressive for K-ras G 12C mutation and that was discontinued after C. difficile lymphocytic colitis and required hospitalization in December 2024 Patient currently on surveillance   - We reviewed his recent CT CAP together.  Patient has no evidence of disease. - Will continue surveillance with CT scan every 3 months. - Lab was reviewed: CMP: Normal creatinine, normal LFT.  CBC: WNL   Return to clinic in 3 months with CT scan    C. difficile colitis Resolved at this time. Patient following with GI for this.  Fatigue Likely secondary to seasonal depression. Depression managed by primary care  - Provided guidance on fatigue, recommended sunlight exposure. -Continue to follow with primary care.   The patient understands the plans discussed today and is in agreement with them.  She knows to contact our office if she develops concerns prior to her next appointment.  The total time spent in the appointment was 13 minutes for the encounter with patient, including review of chart and various tests results, discussions about plan of care and coordination of care  plan   Mickiel Dry, MD  [facility] [place] - A DEPT OF [facility] [address] [place] [address] Dept: [phone] Dept Fax: [phone]   Orders Placed This Encounter  Procedures   CT CHEST ABDOMEN PELVIS W CONTRAST    Standing Status:   Future    Expected Date:   03/29/2025    Expiration Date:   12/29/2025    If indicated for the ordered procedure, I authorize the administration of contrast media per Radiology protocol:   Yes    Does the patient have a contrast media/X-ray dye allergy?:   No    Preferred imaging location?:   [facility]    If indicated for the ordered procedure, I authorize the administration of oral contrast media per Radiology protocol:   Yes     ONCOLOGY HISTORY:   I have reviewed her chart and materials related to her cancer extensively and collaborated history with the patient. Summary of oncologic history is as follows:   Diagnosis: Metastatic adenocarcinoma of the left lung PD-L1 99%, K-ras G12 C   -11/19/2022: CT chest: 2.5 cm partially cavitary left lower lobe nodule with extensive low-density adenopathy in the left lower neck, left supraclavicular region, left mediastinum, left hilum, and left upper quadrant of the abdomen, compatible with malignancy. Scattered subcutaneous nodules in the chest, and several left upper quadrant omental/paracolic nodules, suspicious for malignancy. Nonspecific right adrenal mass 1.8 by 1.2 cm. -11/19/2022: CT soft tissue neck: Lymphadenopathy in the left lower neck beginning at the level 4 nodal station and extending to the  level 6 nodal station. The largest node measures 2.2 x 2.8 cm in size at level 6. -11/29/2022: MRI Brain: No evidence of intracranial metastatic disease.  -12/06/2022: Initial PET: Hypermetabolic LEFT lobe pulmonary nodule consistent with primary bronchogenic carcinoma. Hypermetabolic mediastinal and LEFT supraclavicular  adenopathy. The enlarged AP window nodal metastasis impinges upon the LEFT recurrent laryngeal nerve (LEFT focal cord paralysis). Multiple hypermetabolic cutaneous nodules within the chest, abdomen, pelvis and extremities. RIGHT adrenal gland hypermetabolic metastasis. Peritoneal and  retroperitoneal nodular metastasis. Muscular metastasis primarily in the pelvis. -12/16/2022: Left supraclavicular lymph node biopsy.  Pathology: Metastatic adenocarcinoma. IHC cells positive for CK7 and TTF-1, and are negative for CK20 p40 and Napsin.  -12/16/2022: [facility]:  K-ras G 12C.  MS-stable.  TMB-low.  PD-L1 TPS-99%.  -12/30/2022: 1 cycle of carboplatin , pembrolizumab  and pemetrexed , discontinued when patient was hospitalized for a stroke -02/18/2023-08/18/2023: Keytruda , discontinued secondary to diarrhea and lymphocytic colitis requiring hospitalization (improved s/p 8 weeks of Entocort). -09/26/2023-11/13/2023: Adagrasib  600 mg twice daily, discontinued after patient was hospitalized from 11/13/2023 to 11/15/2023 with C. diff lymphocytic colitis -11/2023-current: Patient is under surveillance with imaging showing NED  Current Treatment:  Surveillance  INTERVAL HISTORY:   Discussed the use of AI scribe software for clinical note transcription with the patient, who gave verbal consent to proceed.  History of Present Illness Patricia [name] is a 79 year old female with stage 4 left lung adenocarcinoma who presents for follow up. She is accompanied by her sister today.   She is more than one year post-chemotherapy for stage 4 left lung adenocarcinoma. She reports persistent fatigue and decreased energy, describing herself as tired at all times with no improvement. She denies dyspnea and does not report any new or worsening symptoms. Recent imaging and laboratory studies were reported as normal, according to the clinician.  She is treated for depression with medication managed by her primary care  provider, with her husband assisting in medication administration. She attributes some of her mood symptoms and fatigue to the winter season and decreased sunlight exposure.  She continues to follow with gastroenterology for prior Clostridioides difficile infection, which has improved with a new medication. She currently experiences mild constipation but denies other gastrointestinal complaints.    I have reviewed the past medical history, past surgical history, social history and family history with the patient and they are unchanged from previous note.  ALLERGIES:  is allergic to codeine.  MEDICATIONS:  Current Outpatient Medications  Medication Sig Dispense Refill   acetaminophen  (TYLENOL ) 500 MG tablet Take 1,000 mg by mouth every 6 (six) hours as needed for mild pain (pain score 1-3).     alendronate  (FOSAMAX ) 70 MG tablet Take 1 tablet (70 mg total) by mouth every 7 (seven) days. Take with a full glass of water  on an empty stomach. 12 tablet 3   ALPRAZolam  (XANAX ) 0.5 MG tablet TAKE 1 TABLET AT BEDTIME AS NEEDED FOR ANXIETY OR SLEEP 30 tablet 2   ELIQUIS  5 MG TABS tablet TAKE 1 TABLET BY MOUTH TWICE A DAY 60 tablet 1   famotidine  (PEPCID ) 40 MG tablet TAKE 1 TABLET BY MOUTH EVERY DAY 90 tablet 1   lansoprazole  (PREVACID ) 30 MG capsule TAKE ONE CAPSULE BY MOUTH DAILY FOR ACID REFLUX 90 capsule 1   potassium chloride  SA (KLOR-CON  M) 20 MEQ tablet TAKE 1 TABLET BY MOUTH 3 TIMES DAILY. 270 tablet 1   prochlorperazine  (COMPAZINE ) 10 MG tablet Take 1 tablet (10 mg total) by mouth every 6 (  six) hours as needed for nausea or vomiting. 30 tablet 0   rosuvastatin  (CRESTOR ) 20 MG tablet Take 1 tablet (20 mg total) by mouth at bedtime. 90 tablet 2   sertraline  (ZOLOFT ) 100 MG tablet Take 1 tablet (100 mg total) by mouth daily. 90 tablet 1   No current facility-administered medications for this visit.    VITALS:  Blood pressure 121/88, pulse 68, temperature 98.2 F (36.8 C), temperature source  Oral, resp. rate 18, weight 108 lb 14.5 oz (49.4 kg), SpO2 98%.  Wt Readings from Last 3 Encounters:  12/29/24 108 lb 14.5 oz (49.4 kg)  11/23/24 108 lb 9.6 oz (49.3 kg)  09/30/24 105 lb (47.6 kg)    Body mass index is 24.42 kg/m.  Performance status (ECOG): 2 - Symptomatic, <50% confined to bed  PHYSICAL EXAM:   GENERAL:alert, no distress and comfortable SKIN: skin color, texture, turgor are normal, no rashes or significant lesions LYMPH:  no palpable lymphadenopathy in the cervical, axillary or inguinal LUNGS: clear to auscultation and percussion with normal breathing effort HEART: regular rate & rhythm and no murmurs and no lower extremity edema ABDOMEN:abdomen soft, non-tender and normal bowel sounds Musculoskeletal:no cyanosis of digits and no clubbing  NEURO: alert & oriented x 3 with fluent speech  LABORATORY DATA:  I have reviewed the data as listed   Lab Results  Component Value Date   WBC 10.2 12/21/2024   NEUTROABS 6.2 12/21/2024   HGB 13.4 12/21/2024   HCT 41.9 12/21/2024   MCV 92.1 12/21/2024   PLT 271 12/21/2024      Chemistry      Component Value Date/Time   NA 141 12/21/2024 1417   NA 144 11/21/2023 1035   K 3.8 12/21/2024 1417   CL 108 12/21/2024 1417   CO2 18 (L) 12/21/2024 1417   BUN 15 12/21/2024 1417   BUN 16 11/21/2023 1035   CREATININE 0.82 12/21/2024 1417   CREATININE 1.20 (H) 09/29/2023 0836      Component Value Date/Time   CALCIUM  9.6 12/21/2024 1417   ALKPHOS 67 12/21/2024 1417   AST 18 12/21/2024 1417   AST 13 (L) 09/29/2023 0836   ALT 8 12/21/2024 1417   ALT 13 09/29/2023 0836   BILITOT 0.2 12/21/2024 1417   BILITOT 0.3 09/29/2023 0836       RADIOGRAPHIC STUDIES: I have personally reviewed the radiological images as listed and agreed with the findings in the report.  CT CHEST ABDOMEN PELVIS W CONTRAST CLINICAL DATA:  Metastatic disease evaluation, lung cancer restaging * Tracking Code: BO *  EXAM: CT CHEST, ABDOMEN, AND  PELVIS WITH CONTRAST  TECHNIQUE: Multidetector CT imaging of the chest, abdomen and pelvis was performed following the standard protocol during bolus administration of intravenous contrast.  RADIATION DOSE REDUCTION: This exam was performed according to the departmental dose-optimization program which includes automated exposure control, adjustment of the mA and/or kV according to patient size and/or use of iterative reconstruction technique.  CONTRAST:  OMNIPAQUE  IOHEXOL  300 MG/ML SOLN additional oral enteric contrast  COMPARISON:  08/23/2024  FINDINGS: CT CHEST FINDINGS  Cardiovascular: Aortic atherosclerosis. Normal heart size. Scattered left coronary artery calcifications. No pericardial effusion.  Mediastinum/Nodes: No enlarged mediastinal, hilar, or axillary lymph nodes. Large hiatal hernia with intrathoracic position of the gastric body and fundus. Thyroid  gland, trachea, and esophagus demonstrate no significant findings.  Lungs/Pleura: Irregular nodularity in the left lower lobe unchanged elongated nodular component measuring 1.2 cm (series 5, image 70). No pleural effusion  or pneumothorax.  Musculoskeletal: No chest wall abnormality. No acute osseous findings.  CT ABDOMEN PELVIS FINDINGS  Hepatobiliary: No solid liver abnormality is seen. No gallstones, gallbladder wall thickening, or biliary dilatation.  Pancreas: Unremarkable. No pancreatic ductal dilatation or surrounding inflammatory changes.  Spleen: Normal in size without significant abnormality.  Adrenals/Urinary Tract: Adrenal glands are unremarkable. Elongated calculus at the left ureterovesicular junction measuring 0.7 cm (series 2, image 78). Mild left hydronephrosis and hydroureter. Additional nonobstructive calculus of the superior pole of the left kidney. Bladder is unremarkable.  Stomach/Bowel: Stomach is within normal limits. Appendix not clearly visualized. No evidence of bowel wall  thickening, distention, or inflammatory changes.  Vascular/Lymphatic: Aortic atherosclerosis. No enlarged abdominal or pelvic lymph nodes.  Reproductive: Hysterectomy.  Other: No abdominal wall hernia or abnormality. No ascites.  Musculoskeletal: No acute osseous findings.  IMPRESSION: 1. Unchanged irregular nodularity in the left lower lobe associated with small pneumatocele. 2. No evidence of lymphadenopathy or metastatic disease in the chest, abdomen, or pelvis. 3. Elongated calculus at the left ureterovesicular junction measuring 0.7 cm. Mild left hydronephrosis and hydroureter. 4. Additional nonobstructive calculus of the superior pole of the left kidney. 5. Large hiatal hernia with intrathoracic position of the gastric body and fundus. 6. Coronary artery disease.  These results will be called to the ordering clinician or representative by the Radiologist Assistant, and communication documented in the PACS or [employer].  Aortic Atherosclerosis (ICD10-I70.0).  Electronically Signed   By: Marolyn JONETTA Jaksch M.D.   On: 12/24/2024 15:02    "

## 2025-03-30 ENCOUNTER — Inpatient Hospital Stay

## 2025-03-30 ENCOUNTER — Other Ambulatory Visit (HOSPITAL_COMMUNITY)

## 2025-03-31 ENCOUNTER — Ambulatory Visit: Admitting: Nurse Practitioner

## 2025-04-06 ENCOUNTER — Inpatient Hospital Stay: Admitting: Oncology
